# Patient Record
Sex: Female | Born: 1950 | Race: White | Hispanic: No | Smoking: Former smoker
Health system: Southern US, Community
[De-identification: ages and names within clinical notes are randomized; demographics above are authoritative.]

## PROBLEM LIST (undated history)

## (undated) DIAGNOSIS — G2581 Restless legs syndrome: Secondary | ICD-10-CM

## (undated) DIAGNOSIS — Z8619 Personal history of other infectious and parasitic diseases: Secondary | ICD-10-CM

## (undated) DIAGNOSIS — F419 Anxiety disorder, unspecified: Secondary | ICD-10-CM

## (undated) DIAGNOSIS — F329 Major depressive disorder, single episode, unspecified: Secondary | ICD-10-CM

## (undated) DIAGNOSIS — I509 Heart failure, unspecified: Secondary | ICD-10-CM

## (undated) DIAGNOSIS — I10 Essential (primary) hypertension: Secondary | ICD-10-CM

## (undated) DIAGNOSIS — J449 Chronic obstructive pulmonary disease, unspecified: Secondary | ICD-10-CM

## (undated) DIAGNOSIS — G4700 Insomnia, unspecified: Secondary | ICD-10-CM

## (undated) DIAGNOSIS — I499 Cardiac arrhythmia, unspecified: Secondary | ICD-10-CM

## (undated) DIAGNOSIS — R0600 Dyspnea, unspecified: Secondary | ICD-10-CM

## (undated) DIAGNOSIS — F32A Depression, unspecified: Secondary | ICD-10-CM

## (undated) DIAGNOSIS — Z95810 Presence of automatic (implantable) cardiac defibrillator: Secondary | ICD-10-CM

## (undated) DIAGNOSIS — F411 Generalized anxiety disorder: Secondary | ICD-10-CM

## (undated) DIAGNOSIS — Z87442 Personal history of urinary calculi: Secondary | ICD-10-CM

## (undated) DIAGNOSIS — R51 Headache: Secondary | ICD-10-CM

## (undated) DIAGNOSIS — I2510 Atherosclerotic heart disease of native coronary artery without angina pectoris: Secondary | ICD-10-CM

## (undated) DIAGNOSIS — R519 Headache, unspecified: Secondary | ICD-10-CM

## (undated) DIAGNOSIS — Z9981 Dependence on supplemental oxygen: Secondary | ICD-10-CM

## (undated) DIAGNOSIS — I429 Cardiomyopathy, unspecified: Secondary | ICD-10-CM

## (undated) HISTORY — PX: CARDIAC CATHETERIZATION: SHX172

## (undated) HISTORY — DX: Cardiac arrhythmia, unspecified: I49.9

## (undated) HISTORY — PX: HERNIA REPAIR: SHX51

## (undated) HISTORY — PX: CARDIAC DEFIBRILLATOR PLACEMENT: SHX171

---

## 1999-03-14 ENCOUNTER — Emergency Department (HOSPITAL_COMMUNITY): Admission: EM | Admit: 1999-03-14 | Discharge: 1999-03-14 | Payer: Self-pay | Admitting: Emergency Medicine

## 1999-03-15 ENCOUNTER — Encounter: Payer: Self-pay | Admitting: Emergency Medicine

## 2005-06-25 ENCOUNTER — Emergency Department: Payer: Self-pay | Admitting: Unknown Physician Specialty

## 2005-06-25 ENCOUNTER — Other Ambulatory Visit: Payer: Self-pay

## 2005-08-20 ENCOUNTER — Emergency Department: Payer: Self-pay

## 2010-03-04 DIAGNOSIS — F411 Generalized anxiety disorder: Secondary | ICD-10-CM | POA: Insufficient documentation

## 2010-04-17 ENCOUNTER — Ambulatory Visit: Payer: Self-pay | Admitting: Family Medicine

## 2010-05-20 DIAGNOSIS — R609 Edema, unspecified: Secondary | ICD-10-CM | POA: Insufficient documentation

## 2010-08-22 ENCOUNTER — Ambulatory Visit: Payer: Self-pay | Admitting: Family Medicine

## 2010-09-06 ENCOUNTER — Inpatient Hospital Stay: Payer: Self-pay | Admitting: Internal Medicine

## 2010-10-14 DIAGNOSIS — G4700 Insomnia, unspecified: Secondary | ICD-10-CM | POA: Insufficient documentation

## 2010-11-01 ENCOUNTER — Ambulatory Visit: Payer: Self-pay | Admitting: Specialist

## 2010-11-21 ENCOUNTER — Ambulatory Visit: Payer: Self-pay | Admitting: Internal Medicine

## 2010-12-08 ENCOUNTER — Inpatient Hospital Stay: Payer: Self-pay | Admitting: Internal Medicine

## 2011-04-25 ENCOUNTER — Ambulatory Visit: Payer: Self-pay | Admitting: Family Medicine

## 2012-08-22 ENCOUNTER — Emergency Department: Payer: Self-pay | Admitting: Emergency Medicine

## 2012-09-21 ENCOUNTER — Inpatient Hospital Stay: Payer: Self-pay | Admitting: Internal Medicine

## 2012-09-21 DIAGNOSIS — I509 Heart failure, unspecified: Secondary | ICD-10-CM

## 2012-09-21 LAB — URINALYSIS, COMPLETE
Bacteria: NONE SEEN
Bilirubin,UR: NEGATIVE
Blood: NEGATIVE
Glucose,UR: NEGATIVE mg/dL (ref 0–75)
Ketone: NEGATIVE
Leukocyte Esterase: NEGATIVE
Nitrite: NEGATIVE
Ph: 5 (ref 4.5–8.0)
Protein: NEGATIVE
RBC,UR: <1 /HPF (ref 0–5)
Specific Gravity: 1.021 (ref 1.003–1.030)
Squamous Epithelial: 3
WBC UR: 1 /HPF (ref 0–5)

## 2012-09-21 LAB — CBC
HCT: 40.4 % (ref 35.0–47.0)
HGB: 13.5 g/dL (ref 12.0–16.0)
MCH: 29.5 pg (ref 26.0–34.0)
MCHC: 33.3 g/dL (ref 32.0–36.0)
MCV: 89 fL (ref 80–100)
Platelet: 202 10*3/uL (ref 150–440)
RBC: 4.56 10*6/uL (ref 3.80–5.20)
RDW: 16.6 % — ABNORMAL HIGH (ref 11.5–14.5)
WBC: 7.3 10*3/uL (ref 3.6–11.0)

## 2012-09-21 LAB — COMPREHENSIVE METABOLIC PANEL
Albumin: 3.9 g/dL (ref 3.4–5.0)
Alkaline Phosphatase: 84 U/L (ref 50–136)
Anion Gap: 5 — ABNORMAL LOW (ref 7–16)
BUN: 13 mg/dL (ref 7–18)
Bilirubin,Total: 0.9 mg/dL (ref 0.2–1.0)
Calcium, Total: 9.1 mg/dL (ref 8.5–10.1)
Chloride: 107 mmol/L (ref 98–107)
Co2: 27 mmol/L (ref 21–32)
Creatinine: 0.71 mg/dL (ref 0.60–1.30)
EGFR (African American): >60
EGFR (Non-African Amer.): >60
Glucose: 93 mg/dL (ref 65–99)
Osmolality: 277 (ref 275–301)
Potassium: 4.1 mmol/L (ref 3.5–5.1)
SGOT(AST): 28 U/L (ref 15–37)
SGPT (ALT): 20 U/L (ref 12–78)
Sodium: 139 mmol/L (ref 136–145)
Total Protein: 7.9 g/dL (ref 6.4–8.2)

## 2012-09-21 LAB — PRO B NATRIURETIC PEPTIDE: B-Type Natriuretic Peptide: 4505 pg/mL — ABNORMAL HIGH (ref 0–125)

## 2012-09-21 LAB — LIPASE, BLOOD: Lipase: 112 U/L (ref 73–393)

## 2012-09-21 LAB — TROPONIN I: Troponin-I: <0.02 ng/mL

## 2012-09-21 LAB — RAPID INFLUENZA A&B ANTIGENS

## 2012-09-22 LAB — CBC WITH DIFFERENTIAL/PLATELET
Basophil %: 0.5 %
Eosinophil #: 0.3 10*3/uL (ref 0.0–0.7)
Eosinophil %: 3.6 %
HGB: 11.8 g/dL — ABNORMAL LOW (ref 12.0–16.0)
Lymphocyte #: 2 10*3/uL (ref 1.0–3.6)
MCH: 29.5 pg (ref 26.0–34.0)
MCHC: 33 g/dL (ref 32.0–36.0)
MCV: 90 fL (ref 80–100)
Monocyte #: 0.6 x10 3/mm (ref 0.2–0.9)
Monocyte %: 7.9 %
Neutrophil #: 4.4 10*3/uL (ref 1.4–6.5)
Platelet: 168 10*3/uL (ref 150–440)
RBC: 4 10*6/uL (ref 3.80–5.20)
RDW: 16.7 % — ABNORMAL HIGH (ref 11.5–14.5)
WBC: 7.3 10*3/uL (ref 3.6–11.0)

## 2012-09-26 LAB — CULTURE, BLOOD (SINGLE)

## 2013-02-22 ENCOUNTER — Inpatient Hospital Stay: Payer: Self-pay | Admitting: Internal Medicine

## 2013-02-22 LAB — COMPREHENSIVE METABOLIC PANEL
ALT: 22 U/L (ref 12–78)
AST: 22 U/L (ref 15–37)
Albumin: 3.4 g/dL (ref 3.4–5.0)
Alkaline Phosphatase: 75 U/L
Anion Gap: 4 — ABNORMAL LOW (ref 7–16)
BUN: 10 mg/dL (ref 7–18)
Bilirubin,Total: 1 mg/dL (ref 0.2–1.0)
CALCIUM: 9 mg/dL (ref 8.5–10.1)
Chloride: 101 mmol/L (ref 98–107)
Co2: 32 mmol/L (ref 21–32)
Creatinine: 0.71 mg/dL (ref 0.60–1.30)
EGFR (Non-African Amer.): >60
Glucose: 101 mg/dL — ABNORMAL HIGH (ref 65–99)
Osmolality: 273 (ref 275–301)
Potassium: 4.3 mmol/L (ref 3.5–5.1)
Sodium: 137 mmol/L (ref 136–145)
TOTAL PROTEIN: 7.7 g/dL (ref 6.4–8.2)

## 2013-02-22 LAB — CBC WITH DIFFERENTIAL/PLATELET
BASOS ABS: 0.1 10*3/uL (ref 0.0–0.1)
Basophil %: 0.8 %
EOS ABS: 0.2 10*3/uL (ref 0.0–0.7)
Eosinophil %: 1.9 %
HCT: 40.2 % (ref 35.0–47.0)
HGB: 13.1 g/dL (ref 12.0–16.0)
LYMPHS ABS: 1.8 10*3/uL (ref 1.0–3.6)
Lymphocyte %: 14 %
MCH: 29.2 pg (ref 26.0–34.0)
MCHC: 32.7 g/dL (ref 32.0–36.0)
MCV: 89 fL (ref 80–100)
Monocyte #: 0.8 x10 3/mm (ref 0.2–0.9)
Monocyte %: 6.4 %
Neutrophil #: 10.1 10*3/uL — ABNORMAL HIGH (ref 1.4–6.5)
Neutrophil %: 76.9 %
Platelet: 204 10*3/uL (ref 150–440)
RBC: 4.5 10*6/uL (ref 3.80–5.20)
RDW: 17.4 % — ABNORMAL HIGH (ref 11.5–14.5)
WBC: 13.1 10*3/uL — ABNORMAL HIGH (ref 3.6–11.0)

## 2013-02-22 LAB — TROPONIN I

## 2013-02-22 LAB — PRO B NATRIURETIC PEPTIDE: B-TYPE NATIURETIC PEPTID: 9944 pg/mL — AB (ref 0–125)

## 2013-02-23 DIAGNOSIS — I059 Rheumatic mitral valve disease, unspecified: Secondary | ICD-10-CM

## 2013-02-23 LAB — CBC WITH DIFFERENTIAL/PLATELET
BASOS PCT: 0.4 %
Basophil #: 0 10*3/uL (ref 0.0–0.1)
EOS PCT: 0.1 %
Eosinophil #: 0 10*3/uL (ref 0.0–0.7)
HCT: 39 % (ref 35.0–47.0)
HGB: 12.3 g/dL (ref 12.0–16.0)
LYMPHS ABS: 1 10*3/uL (ref 1.0–3.6)
Lymphocyte %: 10.3 %
MCH: 28.1 pg (ref 26.0–34.0)
MCHC: 31.6 g/dL — ABNORMAL LOW (ref 32.0–36.0)
MCV: 89 fL (ref 80–100)
MONO ABS: 0.1 x10 3/mm — AB (ref 0.2–0.9)
MONOS PCT: 1.2 %
NEUTROS ABS: 8.5 10*3/uL — AB (ref 1.4–6.5)
Neutrophil %: 88 %
PLATELETS: 187 10*3/uL (ref 150–440)
RBC: 4.39 10*6/uL (ref 3.80–5.20)
RDW: 17.5 % — ABNORMAL HIGH (ref 11.5–14.5)
WBC: 9.7 10*3/uL (ref 3.6–11.0)

## 2013-02-23 LAB — BASIC METABOLIC PANEL
Anion Gap: 4 — ABNORMAL LOW (ref 7–16)
BUN: 11 mg/dL (ref 7–18)
CALCIUM: 8.7 mg/dL (ref 8.5–10.1)
CREATININE: 0.66 mg/dL (ref 0.60–1.30)
Chloride: 104 mmol/L (ref 98–107)
Co2: 29 mmol/L (ref 21–32)
EGFR (Non-African Amer.): >60
Glucose: 153 mg/dL — ABNORMAL HIGH (ref 65–99)
OSMOLALITY: 276 (ref 275–301)
Potassium: 4.1 mmol/L (ref 3.5–5.1)
Sodium: 137 mmol/L (ref 136–145)

## 2013-04-18 ENCOUNTER — Ambulatory Visit: Payer: Self-pay | Admitting: Family Medicine

## 2013-04-22 ENCOUNTER — Emergency Department: Payer: Self-pay | Admitting: Emergency Medicine

## 2013-04-22 LAB — BASIC METABOLIC PANEL
Anion Gap: 4 — ABNORMAL LOW (ref 7–16)
BUN: 15 mg/dL (ref 7–18)
CALCIUM: 8.9 mg/dL (ref 8.5–10.1)
Chloride: 102 mmol/L (ref 98–107)
Co2: 31 mmol/L (ref 21–32)
Creatinine: 0.76 mg/dL (ref 0.60–1.30)
EGFR (African American): >60
EGFR (Non-African Amer.): >60
GLUCOSE: 143 mg/dL — AB (ref 65–99)
Osmolality: 277 (ref 275–301)
Potassium: 3.7 mmol/L (ref 3.5–5.1)
SODIUM: 137 mmol/L (ref 136–145)

## 2013-04-22 LAB — PRO B NATRIURETIC PEPTIDE: B-TYPE NATIURETIC PEPTID: 6573 pg/mL — AB (ref 0–125)

## 2013-04-22 LAB — CBC
HCT: 45.3 % (ref 35.0–47.0)
HGB: 14.4 g/dL (ref 12.0–16.0)
MCH: 28.8 pg (ref 26.0–34.0)
MCHC: 31.8 g/dL — AB (ref 32.0–36.0)
MCV: 91 fL (ref 80–100)
PLATELETS: 206 10*3/uL (ref 150–440)
RBC: 5 10*6/uL (ref 3.80–5.20)
RDW: 17.5 % — ABNORMAL HIGH (ref 11.5–14.5)
WBC: 7.6 10*3/uL (ref 3.6–11.0)

## 2013-04-22 LAB — TROPONIN I: Troponin-I: <0.02 ng/mL

## 2013-09-21 ENCOUNTER — Emergency Department: Payer: Self-pay | Admitting: Emergency Medicine

## 2013-09-21 LAB — PRO B NATRIURETIC PEPTIDE: B-Type Natriuretic Peptide: 2279 pg/mL — ABNORMAL HIGH (ref 0–125)

## 2013-09-21 LAB — BASIC METABOLIC PANEL
ANION GAP: 5 — AB (ref 7–16)
BUN: 15 mg/dL (ref 7–18)
CALCIUM: 9.2 mg/dL (ref 8.5–10.1)
CREATININE: 0.79 mg/dL (ref 0.60–1.30)
Chloride: 104 mmol/L (ref 98–107)
Co2: 32 mmol/L (ref 21–32)
EGFR (Non-African Amer.): >60
Glucose: 97 mg/dL (ref 65–99)
Osmolality: 282 (ref 275–301)
POTASSIUM: 4.1 mmol/L (ref 3.5–5.1)
SODIUM: 141 mmol/L (ref 136–145)

## 2013-09-21 LAB — CBC
HCT: 42.5 % (ref 35.0–47.0)
HGB: 13.4 g/dL (ref 12.0–16.0)
MCH: 29 pg (ref 26.0–34.0)
MCHC: 31.6 g/dL — AB (ref 32.0–36.0)
MCV: 92 fL (ref 80–100)
Platelet: 182 10*3/uL (ref 150–440)
RBC: 4.63 10*6/uL (ref 3.80–5.20)
RDW: 15.2 % — ABNORMAL HIGH (ref 11.5–14.5)
WBC: 6.4 10*3/uL (ref 3.6–11.0)

## 2013-09-21 LAB — TROPONIN I: Troponin-I: <0.02 ng/mL

## 2013-10-08 DIAGNOSIS — L309 Dermatitis, unspecified: Secondary | ICD-10-CM | POA: Insufficient documentation

## 2013-11-06 DIAGNOSIS — R0602 Shortness of breath: Secondary | ICD-10-CM | POA: Insufficient documentation

## 2013-12-07 ENCOUNTER — Emergency Department: Payer: Self-pay | Admitting: Emergency Medicine

## 2013-12-07 LAB — CBC
HCT: 43.4 % (ref 35.0–47.0)
HGB: 14.1 g/dL (ref 12.0–16.0)
MCH: 29.6 pg (ref 26.0–34.0)
MCHC: 32.5 g/dL (ref 32.0–36.0)
MCV: 91 fL (ref 80–100)
Platelet: 171 10*3/uL (ref 150–440)
RBC: 4.77 10*6/uL (ref 3.80–5.20)
RDW: 14.6 % — AB (ref 11.5–14.5)
WBC: 4.8 10*3/uL (ref 3.6–11.0)

## 2013-12-07 LAB — BASIC METABOLIC PANEL
ANION GAP: 9 (ref 7–16)
BUN: 8 mg/dL (ref 7–18)
CHLORIDE: 99 mmol/L (ref 98–107)
CO2: 31 mmol/L (ref 21–32)
Calcium, Total: 8.7 mg/dL (ref 8.5–10.1)
Creatinine: 0.77 mg/dL (ref 0.60–1.30)
EGFR (African American): >60
Glucose: 97 mg/dL (ref 65–99)
OSMOLALITY: 276 (ref 275–301)
POTASSIUM: 3.8 mmol/L (ref 3.5–5.1)
Sodium: 139 mmol/L (ref 136–145)

## 2013-12-07 LAB — PRO B NATRIURETIC PEPTIDE: B-Type Natriuretic Peptide: 4636 pg/mL — ABNORMAL HIGH (ref 0–125)

## 2013-12-07 LAB — TROPONIN I: Troponin-I: <0.02 ng/mL

## 2014-02-03 DIAGNOSIS — B370 Candidal stomatitis: Secondary | ICD-10-CM | POA: Insufficient documentation

## 2014-02-03 DIAGNOSIS — I5023 Acute on chronic systolic (congestive) heart failure: Secondary | ICD-10-CM | POA: Insufficient documentation

## 2014-02-28 ENCOUNTER — Emergency Department: Payer: Self-pay | Admitting: Emergency Medicine

## 2014-03-19 ENCOUNTER — Ambulatory Visit: Payer: Self-pay | Admitting: Internal Medicine

## 2014-03-31 DIAGNOSIS — K8010 Calculus of gallbladder with chronic cholecystitis without obstruction: Secondary | ICD-10-CM | POA: Insufficient documentation

## 2014-04-03 HISTORY — PX: CARDIAC CATHETERIZATION: SHX172

## 2014-04-14 ENCOUNTER — Ambulatory Visit
Admit: 2014-04-14 | Disposition: A | Discharge status: Home / Self Care | Payer: Self-pay | Attending: Cardiology | Admitting: Cardiology

## 2014-04-23 DIAGNOSIS — Z9889 Other specified postprocedural states: Secondary | ICD-10-CM | POA: Insufficient documentation

## 2014-04-24 NOTE — H&P (Signed)
PATIENT NAME:  Maria Neal, Maria Neal MR#:  [id number] DATE OF BIRTH:  1950-06-27  DATE OF ADMISSION:  09/21/2012  PRIMARY CARE PHYSICIAN:  Dr. Clemmie Krill.  PRESENTING COMPLAINT:   Nausea and left lower chest pain.   HISTORY OF PRESENTING ILLNESS:  The patient is a 64 year old female with past medical history of severe cardiomyopathy, ejection fraction 25%, MRSA pneumonia in the past, COPD, current smoker, hypertension, who was in her usual state of health until two days ago, and then she started feeling nauseous. She vomited once and is having left lower chest pain, which is mostly on the side and also going down into her abdomen on the left side, so she decided to come to the Emergency Room. This pain is almost 5 to 6/10, and which is sharp, getting worse with deep breaths or cough. CT of the abdomen was done in the ER, which showed bilateral lower lobe ground glass opacity on the lung but abdomen was without any significant finding. On further questioning, the patient denies any fever or sputum production or any sick contacts. She is having intermittent home oxygen use, which she needs to use very less frequent in the daytime whenever she is short of breath. Her oxygen saturation as per her home saturation checkup is remaining around 90s, sometimes it goes in the high 80s, and she started using oxygen. For the last two days it was dropping up to 85, and so she decided to come to the Emergency Room. In ER on room air, her oxygen saturation was 86 and so she was given 2 L supplemental oxygen, and saturation was maintained about 94 to 95. Hospitalist service is being contacted for admitting her for pneumonia.   REVIEW OF SYSTEMS:  CONSTITUTIONAL:  Negative for fever, fatigue, weakness, pain or weight loss.  EYES:  No blurring, double vision, pain or redness.  EARS, NOSE, THROAT:  No tinnitus, ear pain or hearing loss.  RESPIRATORY:  No cough or wheezing. No shortness of breath.  CARDIOVASCULAR:  Mild chest pain, which  is in the left lower side, getting worse with deep breaths, no edema on the legs, or arrhythmia or palpitations.  GASTROINTESTINAL:  Has feeling of nausea and vomited once, no diarrhea or abdominal pain.  GENITOURINARY:  No dysuria, hematuria or increased frequency of urination.  ENDOCRINE:  No increased sweating or heat or cold intolerance.  SKIN:  No acne, rashes or lesions on the skin.  MUSCULOSKELETAL:  No pain or swelling in the joints.  NEUROLOGICAL:  No numbness, weakness, tremors or vertigo.  PSYCHIATRIC:  No anxiety, insomnia or bipolar disorder.   PAST MEDICAL HISTORY:  1.  In 09/2010, had respiratory failure and MRSA pneumonia.  2.  Hypertension.  3.  Cardiomyopathy, ejection fraction less than 25% in 09/2010.  4.  COPD and chronic respiratory failure, using home oxygen on as-needed basis.   PAST SURGICAL HISTORY:  Bronchoscopies 2 times for pneumonia.  ALLERGIES:  AMOXICILLIN AND LEVAQUIN.   HOME MEDICATIONS:  1.  Aspirin 81 mg. 2. Coreg 3.125 mg b.i.d.  3.  Stopped taking Lasix 2 to 3 months ago because she ran out of that and she did not have any refills. 4.  Does not use any inhalers as she has not had any pulmonologist to followup and she does not have enough finances to cover for her medication.   FAMILY HISTORY:  High blood pressure.   SOCIAL HISTORY:  Lives in [place]. She is a current smoker, was previously smoking 3 to  4 cigarettes a day, currently trying to cut down, and now she is smoking 4 to 5 cigarettes in a week. Denies any alcohol abuse or drug abuse.  PHYSICAL EXAMINATION: VITAL SIGNS:  Temperature 98, pulse 93, respirations 22, and blood pressure 139/79, pulse ox 86 on room air, which is 93 on oxygen supplementation.  HEENT:  Head and neck atraumatic. Conjunctivae pink. Oral mucosa moist.  NECK:  Supple. No JVD.  RESPIRATORY:  Bilateral crepitation present, which is more in lower half of the chest. No wheezing.  CARDIOVASCULAR:  S1, S2 present.  Murmur present, regular.  ABDOMEN:  Soft, nontender. Bowel sounds present. No organomegaly.  SKIN:  No rashes.  LEGS:  No edema.  NEUROLOGICAL:  Power 5/5. Follows commands. No gross abnormality.  JOINTS:  No swelling or tenderness.  PSYCHIATRIC:  Does not appear in any psychiatric illness at this time.  IMPORTANT LABORATORY RESULTS:  BNP 4500, glucose 93, BUN 13, creatine 0.71, sodium 139, potassium 4.1, chloride 107, CO2 27 and calcium 9.1. Troponin less than 0.02. White cell count 7.3, hemoglobin 13.5, platelet count 202 and MCV 89. Urinalysis is grossly negative.   CT of the abdomen and pelvis without contrast:  No urolithiasis or obstructive urolopathy. Heterogeneous ground glass opacity bilateral lung bases, which may be secondary to infectious or inflammatory etiology including hypersensitivity pneumonitis versus edema.   Chest x-ray, PA and lateral, bilateral diffuse interstitial thickening likely representing interstitial edema versus interstitial pneumonitis secondary to an infectious or inflammatory etiology.   ASSESSMENT AND PLAN:  A 64 year old female with the past medical history of chronic obstructive pulmonary disease and oxygen use, current smoker and severe cardiomyopathy, ejection fraction less than 25%, presented with 2-week history of nausea and vomiting, once with mild hypoxia and requiring continuous oxygen supplementation instead of intermittent, which is her baseline.  1.  Pneumonia. As evident by chest x-ray and CT of the abdomen, with having symptoms of left lower chest pain, which is worse on deep breathes and increased oxygen requirement. We will give her Rocephin and azithromycin IV and continue oxygen supplementation. Blood cultures has been sent by Emergency Room.  2.  Acute on chronic congestive heart failure, ejection fraction less than 25%, bilateral crepitation present. No leg edema. Chest x-ray is suggestive of either diffuse interstitial edema versus infiltrate.  We will give her IV Lasix b.i.d. and repeat chest x-ray tomorrow to see comparison. 3.  Hypertension. We will continue Coreg 3.125 mg b.i.d. and aspirin.  4.  Acute on chronic respiratory failure. Oxygen saturation 86 on room air. She is on oxygen supplementation, 2 L nasal cannula oxygen. We will continue that.   CODE STATUS:  FULL CODE.    TOTAL TIME SPENT:  50 minutes. Smoking cessation counseling is done for 5 minutes and she was offered nicotine patch but she refused to use it and she says she will be fine without that. Cessation counseling done for 5 minutes for that.   ____________________________ Ceasar Lund. Anselm Jungling, MD vgv:jm D: 09/21/2012 17:53:00 ET T: 09/21/2012 18:05:45 ET JOB#: [id number]  cc: Ceasar Lund. Anselm Jungling, MD, <Dictator> Vaughan Basta MD ELECTRONICALLY SIGNED 09/27/2012 18:34

## 2014-04-24 NOTE — Discharge Summary (Signed)
PATIENT NAME:  Maria Neal, Maria Neal MR#:  [id number] DATE OF BIRTH:  1950/09/04  DATE OF ADMISSION:  09/21/2012 DATE OF DISCHARGE:  09/22/2012  DISCHARGE DIAGNOSES:  1. Acute on chronic respiratory failure.  2. Congestive heart failure, acute systolic failure.  3. Pneumonia.  4. Hypertension.   CONDITION ON DISCHARGE: Stable.   CODE STATUS: Full code.   MEDICATIONS ON DISCHARGE:  1. Ramipril 2.5 mg oral capsule once a day.  2. Aspirin 81 mg once a day.  3. Coreg 3.125 mg 2 times a day.  4. Furosemide 20 mg take 1/2-tablet once a day.  5. Ceftin 500 mg oral tablet 2 times a day for 3 days.   HOME HEALTH ON DISCHARGE: No.   HOME OXYGEN: Yes, 2 liters nasal cannula oxygen advised as she was taking before.   DIET ON DISCHARGE: Low sodium. Diet consistency: Regular.   ACTIVITY LIMITATION: As tolerated.   TIMEFRAME TO FOLLOWUP: Within 2 to 4 weeks with primary care physician as a regular appointment with Dr. Clemmie Krill in [place], [place].   HISTORY OF PRESENTING ILLNESS: A 64 year old female with past medical history of severe cardiomyopathy, ejection fraction 25%, and MRSA pneumonia in the past, with COPD and current smoker, who presented to the Emergency Room with pain on the abdomen on the left side, which is also on the lower chest, and decided to come to Emergency Room. Pain was 5 to 6 out of 10, sharp and getting worse with deep breath. CT of the abdomen which was done in the ER showed bilateral lower lobe ground-glass opacity on the lung, but abdomen was without any significant finding. Her oxygen saturation in the ER was also going up to 80s on room air, and she was requiring 2 to 3 liters nasal cannula supplemental oxygen, which she uses at home only on as-needed basis. On examination, she also had bilateral crepitation present in lower half of the chest, both sides, so she was admitted with diagnosis of acute on chronic respiratory failure secondary to pneumonia versus acute congestive  heart failure.   HOSPITAL COURSE AND STAY:  1. She was given a dose of IV Lasix and was also given IV Rocephin, and the next day she was feeling much better, and the pain was gone, was able to breathe comfortably without having any pain, so we decided to send her home with advice to finish her course of 5 days of oral antibiotic at home. She tolerated IV ceftriaxone very well in hospital, and so we discharged her on cephalosporin orally.  Other medical issues in the hospital course:  2. Acute systolic heart failure, ejection fraction was less than 20%. Lasix was given IV, and she had significant diuresis in response to that, and so we gave her a prescription of oral Lasix to be taken at home.  3. Hypertension. Continued Coreg and Lasix and remained under control.   IMPORTANT LABORATORY RESULTS IN THE HOSPITAL: BNP on presentation 4500. Creatinine 0.71, sodium 139, potassium 4.1. Troponin was less than 0.02. White cell count was 7.3. Blood cultures were negative, influenza was negative, and urinalysis was negative. CT of the abdomen and pelvis which was done in ER showed no urolithiasis or obstructive uropathy. There is a heterogeneous ground-glass opacity in bilateral lung bases which may be secondary to infectious or inflammatory etiology, including hypersensitivity pneumonitis, versus edema. Chest x-ray, PA and lateral, in the ER showed bilateral diffuse interstitial thickening, likely representing interstitial edema versus interstitial pneumonitis. Repeat chest x-ray on the  next day showed bilateral diffuse interstitial thickening, representing interstitial edema versus pneumonitis. No significant interval change from prior exam.   TOTAL TIME SPENT ON THIS DISCHARGE: 40 minutes.   ____________________________ Ceasar Lund Anselm Jungling, MD vgv:OSi D: 09/25/2012 11:32:50 ET T: 09/25/2012 11:54:32 ET JOB#: [id number]  cc: Ceasar Lund. Anselm Jungling, MD, <Dictator> Vaughan Basta  MD ELECTRONICALLY SIGNED 09/27/2012 18:34

## 2014-04-25 NOTE — Discharge Summary (Signed)
PATIENT NAME:  Maria Neal, Maria Neal MR#:  [id number] DATE OF BIRTH:  1950-03-17  DATE OF ADMISSION:  02/22/2013 DATE OF DISCHARGE:  02/24/2013  PRIMARY DOCTOR: Valetta Close, MD  DISCHARGE DIAGNOSES:  1. Bilateral pneumonia. 2. Chronic systolic heart failure.  3. Hypertension.  4. Chronic obstructive pulmonary disease, on home oxygen.   DISCHARGE MEDICATIONS:  1. Aspirin 81 mg p.o. daily. 2. Coreg 3.125 mg p.o. b.i.d.  3. Lasix 20 mg p.o. b.i.d.  4. Lisinopril 10 mg p.o. daily.  5. Symbicort 160/4.5 one puff b.i.d.  6. Spiriva 18 mcg inhalation daily.  7. Singulair 10 mg p.o. daily.  8. Azithromycin 500 mg daily. The patient given azithromycin until February 28th, that is for 5 days.   CONSULTATIONS: None.   HOSPITAL COURSE: The patient is a 64 year old female patient with history of chronic systolic heart failure, with EF of 25%, who came in because of shortness of breath and also pneumonia. The patient treated with steroids and antibiotics by Dr. Clemmie Krill, and the patient quit smoking 3 weeks ago, comes in because of shortness of breath, tachypnea, and the patient was in respiratory distress with O2 saturations 90% on room air. Chest x-ray showed bilateral pneumonia on admission. The patient's blood pressure was 132/70, and O2 saturations were 90% on room air at rest and temperature 96.4. The patient has diffuse rhonchi and wheezing in both lungs and increased respiratory effort on admission. The patient's chest x-ray revealed bilateral pneumonia, more in the left upper lobe, and EKG showed normal sinus rhythm with no ST-T changes. The patient's white count was 13.1 on admission. So, she was admitted for bilateral pneumonia with failed outpatient therapy with acute respiratory distress. Started on IV Solu-Medrol along with nebulizers, oxygen, and we continued her Spiriva as well as Symbicort and Singulair. The patient received Rocephin and Zithromax during the hospital stay. THE PATIENT HAS  DOCUMENTED ALLERGY TO AMOXICILLIN, BUT SHE TOLERATED ROCEPHIN WELL. The patient's symptoms improved nicely. She did not have any further wheezing. The patient does have a steroid course that she has from a prescription by Dr. Clemmie Krill, so I did not write any prescriptions of steroids. She has like 6 tablets of 20 mg of prednisone. I told her to take two for 2 days and then one for 1 day, the she can see Dr. Clemmie Krill as needed. The patient's initial white count was up at 13.1, and the patient's repeat white count on 22nd was 9.7. The patient's electrolytes were normal. The patient's discharge vitals showed O2 saturations of 91% at rest The patient does have oxygen at home 2 liters. She follows up with Dr. Raul Del. She told me that she does not have portable oxygen, but she does not want it as she could not afford it. She did not want me to set that up.   Chronic systolic heart failure. The patient has history of chronic systolic heart failure with poor EF of around 25%. BNP on admission 9944. The patient has Lasix and also ACE inhibitors, and the patient's echo is repeated here, which showed EF of 20% to 25% and severely decreased LV function and increased left ventricular cavity size. The patient does have Lasix, lisinopril and Coreg that she takes. Advised her to continue that. The patient used to see Dr. Clayborn Bigness before, and advised her to be compliant with medications with low sodium diet and also checking daily weights.   TIME SPENT ON DISCHARGE PREPARATION: More than 30 minutes.   CODE STATUS: Full code.  ____________________________ Epifanio Lesches, MD sk:lb D: 02/26/2013 11:14:21 ET T: 02/26/2013 11:27:42 ET JOB#: [id number]  cc: Epifanio Lesches, MD, <Dictator> Valetta Close, MD Epifanio Lesches MD ELECTRONICALLY SIGNED 02/27/2013 14:11

## 2014-04-25 NOTE — H&P (Signed)
PATIENT NAME:  Maria Neal, Maria Neal MR#:  [id number] DATE OF BIRTH:  [birth year]  DATE OF ADMISSION:  02/22/2013  REFERRING PHYSICIAN:  Dr. Benjaman Lobe.   FAMILY PHYSICIAN:  Dr. Clemmie Krill.   REASON FOR ADMISSION:  Acute respiratory distress with pneumonia.   HISTORY OF PRESENT ILLNESS:  The patient is a 64 year old female with a history of COPD, benign hypertension, previous pneumonia, history of MRSA and a history of CHF.  Has been treated by Dr. Clemmie Krill with by mouth steroids and by mouth antibiotics for pneumonia recently.  She quit smoking three weeks ago.  Presents to the Emergency Room with worsening shortness of breath and tachypneic despite by mouth steroids and by mouth antibiotics.  In the Emergency Room, the patient was noted to be in moderate respiratory distress, she was tachypneic with a sat of 90% on room air.  Chest x-ray revealed bilateral pneumonia and she is now admitted for further evaluation.   PAST MEDICAL HISTORY: 1.  COPD/tobacco abuse.  2.  Benign hypertension.  3.  History of pneumonia.  4.  History of MRSA.  5.  Questionable history of CHF.   MEDICATIONS: 1.  Zestril 10 mg by mouth daily.  2.  Lasix 20 mg by mouth twice daily.  3.  Aspirin 81 mg by mouth daily.  4.  Coreg 3.125 mg by mouth twice daily.   ALLERGIES:  AMOXICILLIN AND LEVAQUIN.   SOCIAL HISTORY:  The patient quit smoking three weeks ago.  Denies alcohol abuse.   FAMILY HISTORY:  Positive for diabetes, stroke, coronary artery disease, COPD and even lung cancer.  Negative for breast or colon cancer.   REVIEW OF SYSTEMS:  CONSTITUTIONAL:  The patient has had fever, but no change in weight.  EYES:  No blurred or double vision.  No glaucoma.  EARS, NOSE, THROAT:  No tinnitus or hearing loss.  No nasal discharge or bleeding.  No difficulty swallowing.  RESPIRATORY:  Denies hemoptysis.  No painful respiration.  CARDIOVASCULAR:  No chest pain or orthopnea.  No palpitations or syncope.  GASTROINTESTINAL:  Some nausea,  but no vomiting or diarrhea.  No abdominal pain or change in bowel habits.  GENITOURINARY:  No dysuria or hematuria.  No incontinence.  ENDOCRINE:  No polyuria or polydipsia.  No heat or cold intolerance.  HEMATOLOGIC:  The patient denies anemia, easy bruising, or bleeding.  LYMPHATIC:  No swollen glands.  MUSCULOSKELETAL:  The patient denies pain in her neck, back, shoulders, knees, or hips.  No gout.  NEUROLOGIC:  No numbness or migraines.  Denies stroke or seizures.  PSYCHIATRIC:  The patient denies anxiety, insomnia, or depression.   PHYSICAL EXAMINATION: GENERAL:  The patient is acutely ill-appearing, in moderate respiratory distress.  VITAL SIGNS:  Currently remarkable for a blood pressure of 132/70 with a heart rate of 86, respiratory rate of 32, temperature of 96.4 and a sat of 90% on room air at rest.  HEENT:  Normocephalic, atraumatic.  Pupils equally round and reactive to light and accommodation.  Extraocular movements are intact.  Sclerae nonicteric.  Conjunctivae clear.  Oropharynx is dry, but clear.  NECK:  Supple without JVD.  No adenopathy or thyromegaly is noted.  LUNGS:  Decreased breath sounds at the bases with scattered rhonchi and wheezes.  No rales.  Respiratory effort is increased.  CARDIAC:  Regular rate and rhythm with an occasional premature beat.  No significant rubs, murmurs or gallops.  PMI is nondisplaced.  Chest wall is nontender.  ABDOMEN:  Soft,  nontender, with normoactive bowel sounds.  No organomegaly or masses were appreciated.  No hernias or bruits were noted.  EXTREMITIES:  Without clubbing, cyanosis or edema.  Pulses were 2+ bilaterally.  SKIN:  Warm and dry without rash or lesions.  NEUROLOGIC:  Cranial nerves II through XII grossly intact.  Deep tendon reflexes were symmetric.  Motor and sensory examination is nonfocal.  PSYCHIATRIC:  The patient who is alert and oriented to person, place, and time.  She was cooperative and used good judgment.    LABORATORY DATA:  EKG revealed sinus rhythm with LVH, but no acute ischemic changes.  Chest x-ray revealed bilateral pneumonia most confluent in the left upper lobe.  Cardiomegaly was present.  White count was 13.1 with a hemoglobin of 13.1.  Glucose was 101 with a BUN of 10, creatinine of 0.71, sodium 137, potassium of 4.3.  GFR of greater than 60.  Troponin was less than 0.02.   ASSESSMENT: 1.  Bilateral pneumonia, failed outpatient treatment.  2.  Acute respiratory distress.  3.  Chronic obstructive pulmonary disease exacerbation.  4.  Benign hypertension.  5.  History of methicillin resistant Staphylococcus aureus.  6.  History of congestive heart failure per patient.   PLAN:  The patient will be admitted to the floor with oxygen, IV steroids, IV antibiotics and DuoNeb SVNs.  We will begin Symbicort, Spiriva and Singulair.  We will send off sputum for Gram stain and culture.  We will supplement oxygen and wean as tolerated.  We will continue her Lasix with her Coreg and lisinopril as well as her aspirin.  We will obtain an echocardiogram because of her history of congestive heart failure and shortness of breath at this time.  We will follow her sugars while on steroids.  Follow-up chest x-ray and labs in the morning.  Further treatment and evaluation will depend upon the patient's progress.   Total time spent on this patient was 50 minutes.    ____________________________ Leonie Douglas Doy Hutching, MD jds:ea D: 02/22/2013 15:48:50 ET T: 02/22/2013 16:00:59 ET JOB#: [id number]  cc: Leonie Douglas. Doy Hutching, MD, <Dictator> Valetta Close, MD JEFFREY Lennice Sites MD ELECTRONICALLY SIGNED 02/22/2013 17:47

## 2014-05-03 ENCOUNTER — Ambulatory Visit: Payer: Medicare Other

## 2014-05-03 ENCOUNTER — Ambulatory Visit
Admission: EM | Admit: 2014-05-03 | Discharge: 2014-05-03 | Disposition: A | Discharge status: Home / Self Care | Payer: Medicare Other | Attending: Internal Medicine | Admitting: Internal Medicine

## 2014-05-03 DIAGNOSIS — Z79899 Other long term (current) drug therapy: Secondary | ICD-10-CM | POA: Diagnosis not present

## 2014-05-03 DIAGNOSIS — I2510 Atherosclerotic heart disease of native coronary artery without angina pectoris: Secondary | ICD-10-CM | POA: Insufficient documentation

## 2014-05-03 DIAGNOSIS — I429 Cardiomyopathy, unspecified: Secondary | ICD-10-CM | POA: Diagnosis not present

## 2014-05-03 DIAGNOSIS — J101 Influenza due to other identified influenza virus with other respiratory manifestations: Secondary | ICD-10-CM

## 2014-05-03 DIAGNOSIS — G4700 Insomnia, unspecified: Secondary | ICD-10-CM | POA: Diagnosis not present

## 2014-05-03 DIAGNOSIS — R05 Cough: Secondary | ICD-10-CM | POA: Diagnosis present

## 2014-05-03 DIAGNOSIS — I10 Essential (primary) hypertension: Secondary | ICD-10-CM | POA: Insufficient documentation

## 2014-05-03 DIAGNOSIS — J111 Influenza due to unidentified influenza virus with other respiratory manifestations: Secondary | ICD-10-CM | POA: Diagnosis not present

## 2014-05-03 DIAGNOSIS — F419 Anxiety disorder, unspecified: Secondary | ICD-10-CM | POA: Diagnosis not present

## 2014-05-03 DIAGNOSIS — R52 Pain, unspecified: Secondary | ICD-10-CM | POA: Diagnosis present

## 2014-05-03 DIAGNOSIS — J441 Chronic obstructive pulmonary disease with (acute) exacerbation: Secondary | ICD-10-CM | POA: Diagnosis not present

## 2014-05-03 DIAGNOSIS — Z833 Family history of diabetes mellitus: Secondary | ICD-10-CM | POA: Diagnosis not present

## 2014-05-03 DIAGNOSIS — Z7982 Long term (current) use of aspirin: Secondary | ICD-10-CM | POA: Diagnosis not present

## 2014-05-03 DIAGNOSIS — I509 Heart failure, unspecified: Secondary | ICD-10-CM | POA: Insufficient documentation

## 2014-05-03 DIAGNOSIS — H6123 Impacted cerumen, bilateral: Secondary | ICD-10-CM | POA: Insufficient documentation

## 2014-05-03 DIAGNOSIS — J449 Chronic obstructive pulmonary disease, unspecified: Secondary | ICD-10-CM | POA: Diagnosis not present

## 2014-05-03 HISTORY — DX: Insomnia, unspecified: G47.00

## 2014-05-03 HISTORY — DX: Atherosclerotic heart disease of native coronary artery without angina pectoris: I25.10

## 2014-05-03 HISTORY — DX: Cardiomyopathy, unspecified: I42.9

## 2014-05-03 HISTORY — DX: Heart failure, unspecified: I50.9

## 2014-05-03 HISTORY — DX: Essential (primary) hypertension: I10

## 2014-05-03 HISTORY — DX: Anxiety disorder, unspecified: F41.9

## 2014-05-03 HISTORY — DX: Chronic obstructive pulmonary disease, unspecified: J44.9

## 2014-05-03 LAB — RAPID INFLUENZA A&B ANTIGENS
Influenza A (ARMC): NOT DETECTED
Influenza B (ARMC): DETECTED

## 2014-05-03 MED ORDER — BENZONATATE 200 MG PO CAPS: 200.0000 mg | ORAL_CAPSULE | Freq: Three times a day (TID) | ORAL | Status: DC | PRN

## 2014-05-03 MED ORDER — OSELTAMIVIR PHOSPHATE 75 MG PO CAPS: 75.0000 mg | ORAL_CAPSULE | Freq: Two times a day (BID) | ORAL | Status: DC

## 2014-05-03 MED ORDER — PREDNISONE 20 MG PO TABS: 40.0000 mg | ORAL_TABLET | Freq: Every day | ORAL | Status: AC

## 2014-05-03 NOTE — ED Notes (Signed)
Patient states that she called her primary doctor on Thursday last week and got a rx for cough medication( that she could not afford). She states that she has been coughing without production, chills. She states that her entire body aches. She states that this started Thursday, patient states that she has been worsening.

## 2014-05-03 NOTE — ED Provider Notes (Signed)
CSN: [id number]     Arrival date & time 05/03/14  63 History   First MD Initiated Contact with Patient 05/03/14 1520     Chief Complaint  Patient presents with  . Generalized Body Aches    Started thursday  . Cough   (Consider location/radiation/quality/duration/timing/severity/associated sxs/prior Treatment) Patient is a 64 y.o. female presenting with cough. The history is provided by the patient. No language interpreter was used.  Cough Progression:  Worsening Chronicity:  New Smoker: no   Context: not sick contacts   Associated symptoms: chills, ear fullness, ear pain, rhinorrhea, shortness of breath, sore throat and wheezing   Associated symptoms: no chest pain, no diaphoresis, no eye discharge and no fever   contacted PCM earlier this week and was prescribed cough medication but she was unable to fill due to cost.  Chills, runny nose, dry cough, sore throat, wheezing x 3 days, bad taste/food doesn't taste good x 3 days.  Heart catheterization one week ago and awaiting pacemaker placement per patient.  Has been using home O2 continuously 2L [place].  Does feel like her congestive heart failure is acting up this week.  Past Medical History  Diagnosis Date  . Hypertension   . CHF (congestive heart failure)   . Cardiomyopathy   . CAD (coronary artery disease)   . Anxiety   . COPD (chronic obstructive pulmonary disease)   . Insomnia    Past Surgical History  Procedure Laterality Date  . Cardiac catheterization  04/2014    Dr. Idelle Leech   Family History  Problem Relation Age of Onset  . Diabetes Mellitus II Mother   . Heart attack Father   . Arthritis Sister   . Heart disease Brother    History  Substance Use Topics  . Smoking status: Never Smoker   . Smokeless tobacco: Not on file  . Alcohol Use: No   OB History    No data available     Review of Systems  Constitutional: Positive for chills, activity change, appetite change and fatigue. Negative for fever, diaphoresis  and unexpected weight change.  HENT: Positive for congestion, ear pain, postnasal drip, rhinorrhea, sinus pressure, sneezing and sore throat. Negative for dental problem, drooling, ear discharge, facial swelling, hearing loss, mouth sores, nosebleeds and tinnitus.   Eyes: Negative.  Negative for discharge.  Respiratory: Positive for cough, shortness of breath and wheezing. Negative for apnea, choking, chest tightness and stridor.   Cardiovascular: Negative for chest pain.  Endocrine: Positive for cold intolerance.  Genitourinary: Negative.   Allergic/Immunologic: Negative for environmental allergies, food allergies and immunocompromised state.  Neurological: Negative.   Hematological: Negative.   Psychiatric/Behavioral: Negative.     Allergies  Amoxicillin; Levaquin; Nitrofuran derivatives; Penicillins; and Zithromax  Home Medications   Prior to Admission medications   Medication Sig Start Date End Date Taking? Authorizing Provider  acetaminophen (TYLENOL) 500 MG tablet Take 500 mg by mouth every 8 (eight) hours as needed.   Yes Historical Provider, MD  aspirin 81 MG tablet Take 81 mg by mouth daily.   Yes Historical Provider, MD  carvedilol (COREG) 6.25 MG tablet Take 6.25 mg by mouth 4 (four) times daily.   Yes Historical Provider, MD  furosemide (LASIX) 20 MG tablet Take 20 mg by mouth daily.   Yes Historical Provider, MD  lisinopril (PRINIVIL,ZESTRIL) 10 MG tablet Take 10 mg by mouth daily.   Yes Historical Provider, MD  benzonatate (TESSALON) 200 MG capsule Take 1 capsule (200 mg total) by mouth  3 (three) times daily as needed for cough. 05/03/14   Olen Cordial, NP  oseltamivir (TAMIFLU) 75 MG capsule Take 1 capsule (75 mg total) by mouth every 12 (twelve) hours. 05/03/14   Olen Cordial, NP   BP 112/58 mmHg  Pulse 77  Temp(Src) 98.6 F (37 C) (Tympanic)  Ht 5\' 4"  (1.626 m)  Wt 194 lb (87.998 kg)  BMI 33.28 kg/m2  SpO2 92% Physical Exam  Constitutional: She appears  well-developed and well-nourished. No distress.  HENT:  Head: Normocephalic and atraumatic.  Right Ear: Hearing and external ear normal. No drainage, swelling or tenderness. No mastoid tenderness. Tympanic membrane is not injected, not scarred, not perforated, not retracted and not bulging. A middle ear effusion is present. No hemotympanum.  Left Ear: Hearing and external ear normal. No drainage, swelling or tenderness. No mastoid tenderness. Tympanic membrane is not injected, not scarred, not perforated, not retracted and not bulging. A middle ear effusion is present. No hemotympanum.  Nose: Mucosal edema and rhinorrhea present. No nose lacerations, sinus tenderness, nasal deformity, septal deviation or nasal septal hematoma. No epistaxis.  No foreign bodies. Right sinus exhibits no maxillary sinus tenderness and no frontal sinus tenderness. Left sinus exhibits no maxillary sinus tenderness and no frontal sinus tenderness.  Mouth/Throat: Uvula is midline. Mucous membranes are not pale, dry and not cyanotic. She does not have dentures. No oral lesions. No trismus in the jaw. No dental abscesses, uvula swelling or dental caries. Posterior oropharyngeal edema and posterior oropharyngeal erythema present. No oropharyngeal exudate or tonsillar abscesses.  Bilateral external auditory canals with cerumen impaction brown soft; cobblestoning posterior pharynx, dyspnea with exertion  Cardiovascular: Normal rate, regular rhythm, normal heart sounds and intact distal pulses.   Pulmonary/Chest: Breath sounds normal. Accessory muscle usage present.  Skin: She is not diaphoretic.    ED Course  Procedures (including critical care time) Labs Review Labs Reviewed  INFLUENZA A&B ANTIGENS([facility])  Discussed with patient influenza B positive recommended starting tamiflu 75mg  po BID x 5 days due to comorbidities.  Patient did have influenza vaccine this year.  Patient reported hearing much improved and ears feel better  after cerumen irrigation/removal.  Re-evaluated patient ears auditory canal right with some erythema 6 oclock position no bleeding noted.  Air fluid level bilateral TMs with some opacity.  99% cerumen removed bilateral external canals.  Patient stated she does not have money to fill her prescriptions if more than $10 until later this month.  Patient requested tessalon pearles prescription for cough stating she had run out earlier this year.  Refilled for patient.  Tylenol 1000mg  po QID prn fever/pain.  Prednisone 40mg  po daily x 3 days.  Discussed chest xray results with patient once available no acute findings.  Patient given copy of report to show PCM when she follows up for re-evaluation later this week.  Patient to go to ER tonight if hemoptysis, worsening DOE, use home oxygen 2L.  Patient verbalized understanding of information/instructions, agreed with plan of care and had no further questions at this time.  Imaging Review Dg Chest 2 View  05/03/2014   CLINICAL DATA:  chills and shortness of breath with cough x3 days  EXAM: CHEST  2 VIEW  COMPARISON:  03/19/2014  FINDINGS: Stable moderate to severe cardiac enlargement and mild central vascular congestion. Discoid atelectasis in the lingula and mild lower lobe atelectasis. Mild atelectasis or scarring in the right middle lobe. No evidence of edema or infiltrate.  IMPRESSION: No acute findings  Electronically Signed   By: Skipper Cliche M.D.   On: 05/03/2014 16:24     MDM   1. Influenza B   2. Cerumen impaction, bilateral        Olen Cordial, NP 05/03/14 2030

## 2014-05-07 ENCOUNTER — Inpatient Hospital Stay
Admission: EM | Admit: 2014-05-07 | Discharge: 2014-05-09 | DRG: 194 | Disposition: A | Discharge status: Home / Self Care | Payer: Medicare Other | Attending: Internal Medicine | Admitting: Internal Medicine

## 2014-05-07 ENCOUNTER — Emergency Department: Payer: Medicare Other

## 2014-05-07 ENCOUNTER — Encounter: Payer: Self-pay | Admitting: Emergency Medicine

## 2014-05-07 DIAGNOSIS — Z23 Encounter for immunization: Secondary | ICD-10-CM | POA: Diagnosis not present

## 2014-05-07 DIAGNOSIS — Z7982 Long term (current) use of aspirin: Secondary | ICD-10-CM

## 2014-05-07 DIAGNOSIS — G4700 Insomnia, unspecified: Secondary | ICD-10-CM | POA: Diagnosis present

## 2014-05-07 DIAGNOSIS — I429 Cardiomyopathy, unspecified: Secondary | ICD-10-CM | POA: Diagnosis present

## 2014-05-07 DIAGNOSIS — R071 Chest pain on breathing: Secondary | ICD-10-CM

## 2014-05-07 DIAGNOSIS — Z881 Allergy status to other antibiotic agents status: Secondary | ICD-10-CM

## 2014-05-07 DIAGNOSIS — I10 Essential (primary) hypertension: Secondary | ICD-10-CM | POA: Diagnosis present

## 2014-05-07 DIAGNOSIS — Z9119 Patient's noncompliance with other medical treatment and regimen: Secondary | ICD-10-CM | POA: Diagnosis present

## 2014-05-07 DIAGNOSIS — J189 Pneumonia, unspecified organism: Secondary | ICD-10-CM | POA: Diagnosis present

## 2014-05-07 DIAGNOSIS — R9431 Abnormal electrocardiogram [ECG] [EKG]: Secondary | ICD-10-CM | POA: Diagnosis present

## 2014-05-07 DIAGNOSIS — I509 Heart failure, unspecified: Secondary | ICD-10-CM | POA: Diagnosis present

## 2014-05-07 DIAGNOSIS — J9610 Chronic respiratory failure, unspecified whether with hypoxia or hypercapnia: Secondary | ICD-10-CM | POA: Diagnosis present

## 2014-05-07 DIAGNOSIS — Z9981 Dependence on supplemental oxygen: Secondary | ICD-10-CM | POA: Diagnosis not present

## 2014-05-07 DIAGNOSIS — J111 Influenza due to unidentified influenza virus with other respiratory manifestations: Secondary | ICD-10-CM | POA: Diagnosis present

## 2014-05-07 DIAGNOSIS — Z8701 Personal history of pneumonia (recurrent): Secondary | ICD-10-CM

## 2014-05-07 DIAGNOSIS — Z87891 Personal history of nicotine dependence: Secondary | ICD-10-CM | POA: Diagnosis not present

## 2014-05-07 DIAGNOSIS — J440 Chronic obstructive pulmonary disease with acute lower respiratory infection: Secondary | ICD-10-CM | POA: Diagnosis present

## 2014-05-07 DIAGNOSIS — F419 Anxiety disorder, unspecified: Secondary | ICD-10-CM | POA: Diagnosis present

## 2014-05-07 DIAGNOSIS — I2510 Atherosclerotic heart disease of native coronary artery without angina pectoris: Secondary | ICD-10-CM | POA: Diagnosis present

## 2014-05-07 DIAGNOSIS — Z880 Allergy status to penicillin: Secondary | ICD-10-CM

## 2014-05-07 DIAGNOSIS — J1100 Influenza due to unidentified influenza virus with unspecified type of pneumonia: Principal | ICD-10-CM | POA: Diagnosis present

## 2014-05-07 DIAGNOSIS — J449 Chronic obstructive pulmonary disease, unspecified: Secondary | ICD-10-CM | POA: Diagnosis present

## 2014-05-07 LAB — CBC WITH DIFFERENTIAL/PLATELET
BASOS ABS: 0.1 10*3/uL (ref 0–0.1)
BASOS PCT: 1 %
Eosinophils Absolute: 0 10*3/uL (ref 0–0.7)
Eosinophils Relative: 0 %
HCT: 42.8 % (ref 35.0–47.0)
HEMOGLOBIN: 14 g/dL (ref 12.0–16.0)
Lymphocytes Relative: 10 %
Lymphs Abs: 1.1 10*3/uL (ref 1.0–3.6)
MCH: 28.5 pg (ref 26.0–34.0)
MCHC: 32.6 g/dL (ref 32.0–36.0)
MCV: 87.3 fL (ref 80.0–100.0)
Monocytes Absolute: 0.6 10*3/uL (ref 0.2–0.9)
Monocytes Relative: 5 %
NEUTROS ABS: 10 10*3/uL — AB (ref 1.4–6.5)
Neutrophils Relative %: 84 %
Platelets: 160 10*3/uL (ref 150–440)
RBC: 4.91 MIL/uL (ref 3.80–5.20)
RDW: 15.3 % — ABNORMAL HIGH (ref 11.5–14.5)
WBC: 11.8 10*3/uL — AB (ref 3.6–11.0)

## 2014-05-07 LAB — COMPREHENSIVE METABOLIC PANEL
ALT: 13 U/L — ABNORMAL LOW (ref 14–54)
AST: 26 U/L (ref 15–41)
Albumin: 4.1 g/dL (ref 3.5–5.0)
Alkaline Phosphatase: 55 U/L (ref 38–126)
Anion gap: 11 (ref 5–15)
BUN: 16 mg/dL (ref 6–20)
CALCIUM: 9.4 mg/dL (ref 8.9–10.3)
CO2: 29 mmol/L (ref 22–32)
CREATININE: 0.62 mg/dL (ref 0.44–1.00)
Chloride: 100 mmol/L — ABNORMAL LOW (ref 101–111)
GFR calc non Af Amer: >60 mL/min (ref >60)
Glucose, Bld: 134 mg/dL — ABNORMAL HIGH (ref 65–99)
POTASSIUM: 4.1 mmol/L (ref 3.5–5.1)
Sodium: 140 mmol/L (ref 135–145)
TOTAL PROTEIN: 7.7 g/dL (ref 6.5–8.1)
Total Bilirubin: 0.5 mg/dL (ref 0.3–1.2)

## 2014-05-07 LAB — TROPONIN I

## 2014-05-07 MED ORDER — ONDANSETRON HCL 4 MG PO TABS: 4.0000 mg | ORAL_TABLET | Freq: Four times a day (QID) | ORAL | Status: DC | PRN

## 2014-05-07 MED ORDER — ACETAMINOPHEN 325 MG PO TABS: 650.0000 mg | ORAL_TABLET | Freq: Four times a day (QID) | ORAL | Status: DC | PRN

## 2014-05-07 MED ORDER — MORPHINE SULFATE 2 MG/ML IJ SOLN: 2.0000 mg | INTRAMUSCULAR | Status: DC | PRN

## 2014-05-07 MED ORDER — ACETAMINOPHEN 650 MG RE SUPP: 650.0000 mg | Freq: Four times a day (QID) | RECTAL | Status: DC | PRN

## 2014-05-07 MED ORDER — HEPARIN SODIUM (PORCINE) 5000 UNIT/ML IJ SOLN
5000.0000 [IU] | Freq: Three times a day (TID) | INTRAMUSCULAR | Status: DC
  Administered 2014-05-08 – 2014-05-09 (×5): 5000 [IU] via SUBCUTANEOUS
  Filled 2014-05-07 (×5): qty 1

## 2014-05-07 MED ORDER — DEXTROSE 5 % IV SOLN
INTRAVENOUS | Status: AC
  Filled 2014-05-07: qty 10

## 2014-05-07 MED ORDER — DEXTROSE 5 % IV SOLN: 500.0000 mg | Freq: Once | INTRAVENOUS | Status: AC

## 2014-05-07 MED ORDER — SODIUM CHLORIDE 0.9 % IJ SOLN
3.0000 mL | Freq: Two times a day (BID) | INTRAMUSCULAR | Status: DC
  Administered 2014-05-08: 3 mL via INTRAVENOUS

## 2014-05-07 MED ORDER — METHYLPREDNISOLONE SODIUM SUCC 125 MG IJ SOLR
60.0000 mg | INTRAMUSCULAR | Status: DC
  Administered 2014-05-08: 60 mg via INTRAVENOUS
  Filled 2014-05-07: qty 2

## 2014-05-07 MED ORDER — ONDANSETRON HCL 4 MG/2ML IJ SOLN: 4.0000 mg | Freq: Four times a day (QID) | INTRAMUSCULAR | Status: DC | PRN

## 2014-05-07 MED ORDER — VANCOMYCIN HCL IN DEXTROSE 1-5 GM/200ML-% IV SOLN
1000.0000 mg | Freq: Once | INTRAVENOUS | Status: AC
  Administered 2014-05-07: 1000 mg via INTRAVENOUS

## 2014-05-07 MED ORDER — AZITHROMYCIN 500 MG IV SOLR
500.0000 mg | INTRAVENOUS | Status: DC
  Administered 2014-05-08: 500 mg via INTRAVENOUS
  Filled 2014-05-07 (×2): qty 500

## 2014-05-07 MED ORDER — VANCOMYCIN HCL IN DEXTROSE 1-5 GM/200ML-% IV SOLN
INTRAVENOUS | Status: AC
  Administered 2014-05-07: 1000 mg via INTRAVENOUS
  Filled 2014-05-07: qty 200

## 2014-05-07 MED ORDER — CEFTRIAXONE SODIUM 1 G IJ SOLR
1.0000 g | INTRAMUSCULAR | Status: DC
  Filled 2014-05-07: qty 10

## 2014-05-07 MED ORDER — ASPIRIN EC 81 MG PO TBEC
81.0000 mg | DELAYED_RELEASE_TABLET | Freq: Every day | ORAL | Status: DC
  Administered 2014-05-08 – 2014-05-09 (×2): 81 mg via ORAL
  Filled 2014-05-07 (×2): qty 1

## 2014-05-07 MED ORDER — GUAIFENESIN-CODEINE 100-10 MG/5ML PO SYRP
5.0000 mL | ORAL_SOLUTION | ORAL | Status: DC | PRN
  Administered 2014-05-08: 5 mL via ORAL

## 2014-05-07 MED ORDER — OSELTAMIVIR PHOSPHATE 75 MG PO CAPS
75.0000 mg | ORAL_CAPSULE | Freq: Two times a day (BID) | ORAL | Status: DC
  Administered 2014-05-08 – 2014-05-09 (×3): 75 mg via ORAL
  Filled 2014-05-07 (×7): qty 1

## 2014-05-07 MED ORDER — CARVEDILOL 6.25 MG PO TABS
6.2500 mg | ORAL_TABLET | Freq: Two times a day (BID) | ORAL | Status: DC
  Administered 2014-05-08 – 2014-05-09 (×4): 6.25 mg via ORAL
  Filled 2014-05-07 (×4): qty 1

## 2014-05-07 MED ORDER — DEXTROSE 5 % IV SOLN
1.0000 g | Freq: Once | INTRAVENOUS | Status: AC
  Administered 2014-05-07: 1 g via INTRAVENOUS

## 2014-05-07 MED ORDER — LISINOPRIL 10 MG PO TABS
10.0000 mg | ORAL_TABLET | Freq: Every day | ORAL | Status: DC
  Administered 2014-05-08 – 2014-05-09 (×2): 10 mg via ORAL
  Filled 2014-05-07 (×2): qty 1

## 2014-05-07 NOTE — ED Notes (Addendum)
Pt states she was seen at [facility] on Sunday and dx with the flu. States she does not feel any better. C/o Frequent productive cough, sob, and heaviness in chest. Spoke to MD today and was told to come to ED for further evaluation. Pt is alert with increased work of breathing noted at this time. Pt states she currently feels like an elephant is sitting on her chest with c/o left sided chest pain.

## 2014-05-07 NOTE — ED Provider Notes (Signed)
[facility] Emergency Department Provider Note    ____________________________________________  Time seen: 1000  I have reviewed the triage vital signs and the nursing notes.   HISTORY  Chief Complaint Shortness of Breath and Pleurisy      HPI Maria Neal is a 64 y.o. female patient was seen in urgent care and diagnosed with the flu several days ago patient reports continuing and increasing cough productive of green phlegm and shortness of breath and then developed a sensation L often sitting on her chest last night which has been there ever since early last night is not worse with exertion is slightly worse when she takes a deep breath patient denies any fever patient does say that she is increased at that her shortness of breath increases with exertion however the elephant is sits on the left side of her mid chest there is no radiation of pain there is no nausea vomiting or any sweating now seems to make it better or worse she reports she is on 1-1/2 L of oxygen at home she has been taking her Tamiflu and all her other medicines as she is instructed she has a history of pneumonia and this feels somewhat like previous pneumonia  Patient reports she gets hives with amoxicillin I discussed with her the possibility of cross reaction with ceftriaxone but the fact that this usually is not a large problem and we will go ahead and give her ceftriaxone patient says she has had Zithromax Z-Pak before she is not aware of any allergy and does not really know why that is on her chart   Past Medical History  Diagnosis Date  . Hypertension   . CHF (congestive heart failure)   . Cardiomyopathy   . CAD (coronary artery disease)   . Anxiety   . COPD (chronic obstructive pulmonary disease)   . Insomnia     There are no active problems to display for this patient.   Past Surgical History  Procedure Laterality Date  . Cardiac catheterization  04/2014    Dr. Idelle Leech   . Cardiac catheterization      Current Outpatient Rx  Name  Route  Sig  Dispense  Refill  . acetaminophen (TYLENOL) 500 MG tablet   Oral   Take 500 mg by mouth every 8 (eight) hours as needed.         Marland Kitchen aspirin 81 MG tablet   Oral   Take 81 mg by mouth daily.         . benzonatate (TESSALON) 200 MG capsule   Oral   Take 1 capsule (200 mg total) by mouth 3 (three) times daily as needed for cough.   30 capsule   0   . carvedilol (COREG) 6.25 MG tablet   Oral   Take 6.25 mg by mouth 4 (four) times daily.         . furosemide (LASIX) 20 MG tablet   Oral   Take 20 mg by mouth daily.         Marland Kitchen lisinopril (PRINIVIL,ZESTRIL) 10 MG tablet   Oral   Take 10 mg by mouth daily.         Marland Kitchen oseltamivir (TAMIFLU) 75 MG capsule   Oral   Take 1 capsule (75 mg total) by mouth every 12 (twelve) hours.   10 capsule   0     Allergies Amoxicillin; Levaquin; Nitrofuran derivatives; Penicillins; and Zithromax  Family History  Problem Relation Age of Onset  .  Diabetes Mellitus II Mother   . Heart attack Father   . Arthritis Sister   . Heart disease Brother     Social History History  Substance Use Topics  . Smoking status: Never Smoker   . Smokeless tobacco: Not on file  . Alcohol Use: No    Review of Systems  Constitutional: Negative for fever. Eyes: Negative for visual changes. ENT: Negative for sore throat. Gastrointestinal: Negative for abdominal pain, vomiting and diarrhea. Genitourinary: Negative for dysuria. Musculoskeletal: Negative for back pain. Skin: Negative for rash. Neurological: Negative for headaches, focal weakness or numbness.  10-point ROS otherwise negative.  ____________________________________________   PHYSICAL EXAM:  VITAL SIGNS: ED Triage Vitals  Enc Vitals Group     BP 05/07/14 2118 145/58 mmHg     Pulse Rate 05/07/14 2118 67     Resp 05/07/14 2118 22     Temp 05/07/14 2118 97.8 F (36.6 C)     Temp Source 05/07/14 2118  Oral     SpO2 05/07/14 2114 90 %     Weight 05/07/14 2118 194 lb (87.998 kg)     Height 05/07/14 2118 5\' 4"  (1.626 m)     Head Cir --      Peak Flow --      Pain Score 05/07/14 2118 7     Pain Loc --      Pain Edu? --      Excl. in [place]? --      Constitutional: Alert and oriented. Well appearing and in no distress. Eyes: Conjunctivae are normal. PERRL. Normal extraocular movements. ENT   Head: Normocephalic and atraumatic.   Nose: No congestion/rhinnorhea.   Mouth/Throat: Mucous membranes are moist.   Neck: No stridor. Hematological/Lymphatic/Immunilogical: No cervical lymphadenopathy. Cardiovascular: Normal rate, regular rhythm. Normal and symmetric distal pulses are present in all extremities. No murmurs, rubs, or gallops. Respiratory: She is a somewhat increased respiratory effort with some use of accessory muscles she has scattered wheezes throughout and some crackles on the left side Gastrointestinal: Soft and nontender. No distention. No abdominal bruits. There is no CVA tenderness. Musculoskeletal: Nontender with normal range of motion in all extremities. No joint effusions.  No lower extremity tenderness nor edema. Neurologic:  Normal speech and language. No gross focal neurologic deficits are appreciated. Speech is normal. No gait instability. Skin:  Skin is warm, dry and intact. No rash noted. Psychiatric: Mood and affect are normal. Speech and behavior are normal. Patient exhibits appropriate insight and judgment.  ____________________________________________    LABS (pertinent positives/negatives)    ____________________________________________   EKG  There are no old EKGs available at the present time patient has flipped T's in serially and in the lateral chest leads on the current EKG  ____________________________________________    RADIOLOGY chest x-ray shows left-sided infiltrate per radiology and my review  ____________________________________________   PROCEDURES  Procedure(s) performed: None  Critical Care performed: No  ____________________________________________   INITIAL IMPRESSION / ASSESSMENT AND PLAN / ED COURSE  Pertinent labs & imaging results that were available during my care of the patient were reviewed by me and considered in my medical decision making (see chart for details).    ____________________________________________   FINAL CLINICAL IMPRESSION(S) / ED DIAGNOSES  Final diagnoses:  Community acquired pneumonia  Abnormal EKG  Chest pain on breathing   [facility] Emergency Department Provider Note   ____________________________________________  [facility] Emergency Department Provider Note    ____________________________________________  Time seen:  I have reviewed the triage vital signs and the nursing notes.   HISTORY  Chief Complaint Shortness of Breath and Pleurisy        HPI       Past Medical History  Diagnosis Date  . Hypertension   . CHF (congestive heart failure)   . Cardiomyopathy   . CAD (coronary artery disease)   . Anxiety   . COPD (chronic obstructive pulmonary disease)   . Insomnia     There are no active problems to display for this patient.   Past Surgical History  Procedure Laterality Date  . Cardiac catheterization  04/2014    Dr. Idelle Leech  . Cardiac catheterization      Current Outpatient Rx  Name  Route  Sig  Dispense  Refill  . acetaminophen (TYLENOL) 500 MG tablet   Oral   Take 500 mg by mouth every 8 (eight) hours as needed.         Marland Kitchen aspirin 81 MG tablet   Oral   Take 81 mg by mouth daily.         . benzonatate (TESSALON) 200 MG capsule   Oral   Take 1 capsule (200 mg total) by mouth 3 (three) times daily as needed for cough.   30 capsule   0   . carvedilol (COREG) 6.25 MG tablet   Oral   Take 6.25 mg by mouth 4 (four)  times daily.         . furosemide (LASIX) 20 MG tablet   Oral   Take 20 mg by mouth daily.         Marland Kitchen lisinopril (PRINIVIL,ZESTRIL) 10 MG tablet   Oral   Take 10 mg by mouth daily.         Marland Kitchen oseltamivir (TAMIFLU) 75 MG capsule   Oral   Take 1 capsule (75 mg total) by mouth every 12 (twelve) hours.   10 capsule   0     Allergies Amoxicillin; Levaquin; Nitrofuran derivatives; Penicillins; and Zithromax  Family History  Problem Relation Age of Onset  . Diabetes Mellitus II Mother   . Heart attack Father   . Arthritis Sister   . Heart disease Brother     Social History History  Substance Use Topics  . Smoking status: Never Smoker   . Smokeless tobacco: Not on file  . Alcohol Use: No    Review of Systems   Constitutional: Negative for fever. Eyes: Negative for visual changes. ENT: Negative for sore throat. Cardiovascular: Negative for chest pain. Respiratory: Negative for shortness of breath. Gastrointestinal: Negative for abdominal pain, vomiting and diarrhea. Genitourinary: Negative for dysuria. Musculoskeletal: Negative for back pain. Skin: Negative for rash. Neurological: Negative for headaches, focal weakness or numbness.    10-point ROS otherwise negative.  ____________________________________________   PHYSICAL EXAM:  VITAL SIGNS: ED Triage Vitals  Enc Vitals Group     BP 05/07/14 2118 145/58 mmHg     Pulse Rate 05/07/14 2118 67     Resp 05/07/14 2118 22     Temp 05/07/14 2118 97.8 F (36.6 C)     Temp Source 05/07/14 2118 Oral     SpO2 05/07/14 2114 90 %     Weight 05/07/14 2118 194 lb (87.998 kg)     Height 05/07/14 2118 5\' 4"  (1.626 m)     Head Cir --      Peak Flow --      Pain Score 05/07/14 2118 7  Pain Loc --      Pain Edu? --      Excl. in [place]? --       Constitutional: Alert and oriented. Well appearing and in no distress. Eyes: Conjunctivae are normal. PERRL. Normal extraocular movements. ENT   Head:  Normocephalic and atraumatic.   Nose: No congestion/rhinnorhea.   Mouth/Throat: Mucous membranes are moist.   Neck: No stridor. Hematological/Lymphatic/Immunilogical: No cervical lymphadenopathy. Cardiovascular: Normal rate, regular rhythm. Normal and symmetric distal pulses are present in all extremities. No murmurs, rubs, or gallops. Respiratory: Normal respiratory effort without tachypnea nor retractions. Breath sounds are clear and equal bilaterally. No wheezes/rales/rhonchi. Gastrointestinal: Soft and nontender. No distention. No abdominal bruits. There is no CVA tenderness. Genitourinary:   Musculoskeletal: Nontender with normal range of motion in all extremities. No joint effusions.  No lower extremity tenderness nor edema. Neurologic:  Normal speech and language. No gross focal neurologic deficits are appreciated. Speech is normal. No gait instability. Skin:  Skin is warm, dry and intact. No rash noted. Psychiatric: Mood and affect are normal. Speech and behavior are normal. Patient exhibits appropriate insight and judgment.  ____________________________________________    LABS (pertinent positives/negatives)     ____________________________________________   EKG     ____________________________________________    RADIOLOGY     ____________________________________________   PROCEDURES  Procedure(s) performed: None  Critical Care performed: No     ____________________________________________   INITIAL IMPRESSION / ASSESSMENT AND PLAN / ED COURSE  Pertinent labs & imaging results that were available during my care of the patient were reviewed by me and considered in my medical decision making (see chart for details).    ____________________________________________   FINAL CLINICAL IMPRESSION(S) / ED DIAGNOSES  Final diagnoses:  Community acquired pneumonia  Abnormal EKG  Chest pain on breathing    Nena Polio, MD 05/07/14  2231

## 2014-05-07 NOTE — H&P (Signed)
[place] at [place] NAME: Maria Neal    MR#:  [id number]  DATE OF BIRTH:  1950/02/19   DATE OF ADMISSION:  05/07/2014  PRIMARY CARE PHYSICIAN: BLISS, Lynnell Jude, MD   REQUESTING/REFERRING PHYSICIAN: Malinda  CHIEF COMPLAINT:   Chief Complaint  Patient presents with  . Shortness of Breath  . Pleurisy    HISTORY OF PRESENT ILLNESS:  Maria Neal  is a 63 y.o. female with a known history of COPD noncompliant with home oxygen, essential hypertension, coronary artery disease who is presenting with shortness of breath. She is complaining of 4 days duration and shortness of breath mainly has dyspnea on exertion, with associated cough productive of greenish sputum as well as subjective fevers chills. She was evaluated in urgent care on 05/03/2014 diagnosed with influenza be started on Tamiflu therapy. Given worsening symptoms presents possible further workup and evaluation prior to this she was seen by her PCP noted to be hypoxemic the [facility] further workup and evaluation  PAST MEDICAL HISTORY:   Past Medical History  Diagnosis Date  . Hypertension   . CHF (congestive heart failure)   . Cardiomyopathy   . CAD (coronary artery disease)   . Anxiety   . COPD (chronic obstructive pulmonary disease)   . Insomnia     PAST SURGICAL HISTORY:   Past Surgical History  Procedure Laterality Date  . Cardiac catheterization  04/2014    Dr. Idelle Leech  . Cardiac catheterization      SOCIAL HISTORY:   History  Substance Use Topics  . Smoking status: Former Research scientist (life sciences)  . Smokeless tobacco: Not on file  . Alcohol Use: No    FAMILY HISTORY:   Family History  Problem Relation Age of Onset  . Diabetes Mellitus II Mother   . Heart attack Father   . Arthritis Sister   . Heart disease Brother     DRUG ALLERGIES:   Allergies  Allergen Reactions  . Levaquin [Levofloxacin] Anaphylaxis  . Amoxicillin Hives  . Nitrofuran  Derivatives Other (See Comments)    Reaction: unknown  . Penicillins Other (See Comments)    Thinks it made her itch a lot, but isn't sure.    REVIEW OF SYSTEMS:  REVIEW OF SYSTEMS:  CONSTITUTIONAL: Positive for subjective fevers, chills, fatigue, weakness.  EYES: Denies blurred vision, double vision, or eye pain.  EARS, NOSE, THROAT: Denies tinnitus, ear pain, hearing loss.  RESPIRATORY: Positive cough, shortness of breath, wheezing , as stated above CARDIOVASCULAR: Denies chest pain, palpitations, edema.  GASTROINTESTINAL: Denies nausea, vomiting, diarrhea, abdominal pain.  GENITOURINARY: Denies dysuria, hematuria.  ENDOCRINE: Denies nocturia or thyroid problems. HEMATOLOGIC AND LYMPHATIC: Denies easy bruising or bleeding.  SKIN: Denies rash or lesions.  MUSCULOSKELETAL: Denies pain in neck, back, shoulder, knees, hips, or further arthritic symptoms.  NEUROLOGIC: Denies paralysis, paresthesias.  PSYCHIATRIC: Denies anxiety or depressive symptoms. Otherwise full review of systems performed by me is negative.   MEDICATIONS AT HOME:   Prior to Admission medications   Medication Sig Start Date End Date Taking? Authorizing Provider  acetaminophen (TYLENOL) 500 MG tablet Take 500-1,000 mg by mouth every 8 (eight) hours as needed for mild pain or headache.    Yes Historical Provider, MD  aspirin EC 81 MG tablet Take 81 mg by mouth daily.   Yes Historical Provider, MD  carvedilol (COREG) 6.25 MG tablet Take 6.25 mg by mouth 2 (two) times daily.    Yes Historical Provider, MD  furosemide (LASIX) 20 MG tablet Take 20 mg by mouth daily.   Yes Historical Provider, MD  lisinopril (PRINIVIL,ZESTRIL) 10 MG tablet Take 10 mg by mouth daily.   Yes Historical Provider, MD  oseltamivir (TAMIFLU) 75 MG capsule Take 1 capsule (75 mg total) by mouth every 12 (twelve) hours. 05/03/14  Yes Olen Cordial, NP  benzonatate (TESSALON) 200 MG capsule Take 1 capsule (200 mg total) by mouth 3 (three) times  daily as needed for cough. Patient not taking: Reported on 05/07/2014 05/03/14   Olen Cordial, NP      VITAL SIGNS:  Blood pressure 153/73, pulse 69, temperature 97.8 F (36.6 C), temperature source Oral, resp. rate 21, height 5\' 4"  (1.626 m), weight 194 lb (87.998 kg), SpO2 93 %.  PHYSICAL EXAMINATION:  VITAL SIGNS: Filed Vitals:   05/07/14 2300  BP: 153/73  Pulse: 69  Temp:   Resp: 21   GENERAL:63 y.o.female currently in no acute distress.  HEAD: Normocephalic, atraumatic.  EYES: Pupils equal, round, reactive to light. Extraocular muscles intact. No scleral icterus.  MOUTH: Moist mucosal membrane. Dentition intact. No abscess noted.  EAR, NOSE, THROAT: Clear without exudates. No external lesions.  NECK: Supple. No thyromegaly. No nodules. No JVD.  PULMONARY: Left basilar rhonchi, scant wheeze . No use of accessory muscles, Good respiratory effort. Poor air entry bilaterally CHEST: Nontender to palpation.  CARDIOVASCULAR: S1 and S2. Regular rate and rhythm. No murmurs, rubs, or gallops. No edema. Pedal pulses 2+ bilaterally.  GASTROINTESTINAL: Soft, nontender, nondistended. No masses. Positive bowel sounds. No hepatosplenomegaly.  MUSCULOSKELETAL: No swelling, clubbing, or edema. Range of motion full in all extremities.  NEUROLOGIC: Cranial nerves II through XII are intact. No gross focal neurological deficits. Sensation intact. Reflexes intact.  SKIN: No ulceration, lesions, rashes, or cyanosis. Skin warm and dry. Turgor intact.  PSYCHIATRIC: Mood, affect within normal limits. The patient is awake, alert and oriented x 3. Insight, judgment intact.    LABORATORY PANEL:   CBC  Recent Labs Lab 05/07/14 2227  WBC 11.8*  HGB 14.0  HCT 42.8  PLT 160   ------------------------------------------------------------------------------------------------------------------  Chemistries   Recent Labs Lab 05/07/14 2150  NA 140  K 4.1  CL 100*  CO2 29  GLUCOSE 134*  BUN 16   CREATININE 0.62  CALCIUM 9.4  AST 26  ALT 13*  ALKPHOS 55  BILITOT 0.5   ------------------------------------------------------------------------------------------------------------------  Cardiac Enzymes  Recent Labs Lab 05/07/14 2150  TROPONINI <0.03   ------------------------------------------------------------------------------------------------------------------  RADIOLOGY:  Dg Chest 2 View  05/07/2014   CLINICAL DATA:  Shortness of Breath  EXAM: CHEST  2 VIEW  COMPARISON:  05/03/2014  FINDINGS: Cardiomegaly again noted. No pulmonary edema. There is patchy infiltrate left midlung highly suspicious for pneumonia. Follow-up to resolution after treatment is recommended. Osteopenia and mild degenerative changes thoracic spine.  IMPRESSION: Patchy infiltrate in left midlung suspicious for pneumonia. Follow-up to resolution is recommended.   Electronically Signed   By: Lahoma Crocker M.D.   On: 05/07/2014 22:03    EKG:   Orders placed or performed during the hospital encounter of 05/07/14  . ED EKG  . ED EKG    IMPRESSION AND PLAN:   64 year old Caucasian female history of COPD not O2 compliant presenting with shortness of breath recently diagnosed with influenza be started on Tamiflu.  1. Community acquired pneumonia: Provide supplemental O2 to keep SaO2 greater than 92%, DuoNeb treatments every 4 hours, antibiotic coverage with azithromycin as well as ceftriaxone, Solu-Medrol 60  mg IV daily. 2. Hypertension essential: Continue lisinopril as well as Coreg. 3. COPD, unspecified type: Continue with home medications in addition to DuoNeb treatments and Solu-Medrol stated above. 4. Coronary artery disease: Continue aspirin therapy and. 5. Venous thromboembolism prophylactic: Heparin subcutaneous    All the records are reviewed and case discussed with ED provider. Management plans discussed with the patient, family and they are in agreement.  CODE STATUS: Full  TOTAL TIME  TAKING CARE OF THIS PATIENT: 45 minutes.    Laddie Naeem,  Karenann Cai.D on 05/07/2014 at 11:35 PM  Between 7am to 6pm - Pager - [phone]  After 6pm go to [email or web address] - password [email or web address] [facility]  Office  [phone]  CC: Primary care physician; Lynnell Jude, MD

## 2014-05-08 LAB — CBC
HEMATOCRIT: 38.8 % (ref 35.0–47.0)
Hemoglobin: 12.8 g/dL (ref 12.0–16.0)
MCH: 28.7 pg (ref 26.0–34.0)
MCHC: 32.8 g/dL (ref 32.0–36.0)
MCV: 87.3 fL (ref 80.0–100.0)
PLATELETS: 126 10*3/uL — AB (ref 150–440)
RBC: 4.45 MIL/uL (ref 3.80–5.20)
RDW: 14.9 % — AB (ref 11.5–14.5)
WBC: 9 10*3/uL (ref 3.6–11.0)

## 2014-05-08 LAB — BASIC METABOLIC PANEL
Anion gap: 7 (ref 5–15)
BUN: 13 mg/dL (ref 6–20)
CHLORIDE: 101 mmol/L (ref 101–111)
CO2: 29 mmol/L (ref 22–32)
CREATININE: 0.54 mg/dL (ref 0.44–1.00)
Calcium: 8.6 mg/dL — ABNORMAL LOW (ref 8.9–10.3)
GFR calc non Af Amer: >60 mL/min (ref >60)
Glucose, Bld: 133 mg/dL — ABNORMAL HIGH (ref 65–99)
Potassium: 4 mmol/L (ref 3.5–5.1)
Sodium: 137 mmol/L (ref 135–145)

## 2014-05-08 MED ORDER — DEXTROSE 5 % IV SOLN
500.0000 mg | INTRAVENOUS | Status: DC
  Administered 2014-05-08: 500 mg via INTRAVENOUS
  Filled 2014-05-08 (×2): qty 500

## 2014-05-08 MED ORDER — GUAIFENESIN-CODEINE 100-10 MG/5ML PO SOLN
ORAL | Status: AC
  Administered 2014-05-08: 5 mL via ORAL
  Filled 2014-05-08: qty 5

## 2014-05-08 MED ORDER — ALPRAZOLAM 1 MG PO TABS
1.0000 mg | ORAL_TABLET | Freq: Once | ORAL | Status: AC
  Administered 2014-05-08: 1 mg via ORAL
  Filled 2014-05-08: qty 1

## 2014-05-08 MED ORDER — FUROSEMIDE 20 MG PO TABS
20.0000 mg | ORAL_TABLET | Freq: Every day | ORAL | Status: DC
  Administered 2014-05-08 – 2014-05-09 (×2): 20 mg via ORAL
  Filled 2014-05-08 (×4): qty 1

## 2014-05-08 MED ORDER — DIPHENHYDRAMINE HCL 25 MG PO CAPS
25.0000 mg | ORAL_CAPSULE | ORAL | Status: DC | PRN
  Administered 2014-05-08: 25 mg via ORAL
  Filled 2014-05-08: qty 1

## 2014-05-08 MED ORDER — CEFTRIAXONE SODIUM 1 G IJ SOLR
1.0000 g | INTRAMUSCULAR | Status: DC
  Administered 2014-05-08: 1 g via INTRAVENOUS
  Filled 2014-05-08 (×2): qty 10

## 2014-05-08 MED ORDER — METHYLPREDNISOLONE SODIUM SUCC 40 MG IJ SOLR
40.0000 mg | INTRAMUSCULAR | Status: DC
  Administered 2014-05-09: 40 mg via INTRAVENOUS
  Filled 2014-05-08: qty 1

## 2014-05-08 NOTE — Progress Notes (Signed)
ICU called to report pt had a 17 beat run of SVT. Pt was asymptomatic and up in bathroom.

## 2014-05-08 NOTE — Progress Notes (Signed)
rn notified

## 2014-05-08 NOTE — Care Management Note (Signed)
Case Management Note  Patient Details  Name: Maria Neal MRN: [id number] Date of Birth: 11-Nov-1950  Subjective/Objective:                  COPD RNCM Consult: Patient resting in bed with sister at bedside. She states she in on chronic O2 at home through [place]. Her PCP is Dr. Clemmie Krill of [place], [place]. She gets her Rx from [facility] and denies difficultly with obtaining meds or transportation. She states she is independent with ambulation. She has used [facility] in the past.   Action/Plan:  RNCM to continue to follow for discharge needs. None noted at this time.  Expected Discharge Date:                  Expected Discharge Plan:     In-House Referral:     Discharge planning Services  CM Consult  Post Acute Care Choice:    Choice offered to:  Patient, Sibling  DME Arranged:    DME Agency:  NA  HH Arranged:    [place] Agency:     Status of Service:  In process, will continue to follow  Medicare Important Message Given:  Yes Date Medicare IM Given:  05/08/14 Medicare IM give by:  [name] Date Additional Medicare IM Given:    Additional Medicare Important Message give by:     If discussed at [place] of Stay Meetings, dates discussed:    Additional Comments:  [name], RN 05/08/2014, 8:49 AM

## 2014-05-08 NOTE — Progress Notes (Signed)
Romie Minus receiving IV ABX, afebrile. C/o itching and anxiety controlled with PRN medications. Denies pain. Resting well with family at bedside through the night. Nursing continues to monitor and assist with ADLs.

## 2014-05-08 NOTE — Progress Notes (Signed)
[place] at [place] NAME: Maria Neal    MR#:  [id number]  DATE OF BIRTH:  1950-09-28  SUBJECTIVE:  Patient here with SOB and found to have PNA. SH eis feeling better then admission. + cough   REVIEW OF SYSTEMS:    Review of Systems  Constitutional: Positive for malaise/fatigue. Negative for fever and chills.  Respiratory: Positive for cough, sputum production and shortness of breath. Negative for hemoptysis.   Cardiovascular: Negative for chest pain and palpitations.  Gastrointestinal: Positive for heartburn. Negative for nausea.  Neurological: Negative for tremors and headaches.  Psychiatric/Behavioral: Negative for depression.    Tolerating Diet:YES      DRUG ALLERGIES:   Allergies  Allergen Reactions  . Levaquin [Levofloxacin] Anaphylaxis  . Amoxicillin Hives  . Nitrofuran Derivatives Other (See Comments)    Reaction: unknown  . Penicillins Other (See Comments)    Thinks it made her itch a lot, but isn't sure.    VITALS:  Blood pressure 117/46, pulse 74, temperature 97.4 F (36.3 C), temperature source Oral, resp. rate 19, height 5\' 4"  (1.626 m), weight 87.998 kg (194 lb), SpO2 93 %.  PHYSICAL EXAMINATION:   Physical Exam    LABORATORY PANEL:   CBC  Recent Labs Lab 05/08/14 0428  WBC 9.0  HGB 12.8  HCT 38.8  PLT 126*   ------------------------------------------------------------------------------------------------------------------  Chemistries   Recent Labs Lab 05/07/14 2150 05/08/14 0428  NA 140 137  K 4.1 4.0  CL 100* 101  CO2 29 29  GLUCOSE 134* 133*  BUN 16 13  CREATININE 0.62 0.54  CALCIUM 9.4 8.6*  AST 26  --   ALT 13*  --   ALKPHOS 55  --   BILITOT 0.5  --    ------------------------------------------------------------------------------------------------------------------  Cardiac Enzymes  Recent Labs Lab 05/07/14 2150  TROPONINI <0.03    ------------------------------------------------------------------------------------------------------------------  RADIOLOGY:  Dg Chest 2 View  05/07/2014   CLINICAL DATA:  Shortness of Breath  EXAM: CHEST  2 VIEW  COMPARISON:  05/03/2014  FINDINGS: Cardiomegaly again noted. No pulmonary edema. There is patchy infiltrate left midlung highly suspicious for pneumonia. Follow-up to resolution after treatment is recommended. Osteopenia and mild degenerative changes thoracic spine.  IMPRESSION: Patchy infiltrate in left midlung suspicious for pneumonia. Follow-up to resolution is recommended.   Electronically Signed   By: Lahoma Crocker M.D.   On: 05/07/2014 22:03     ASSESSMENT AND PLAN:  64 year old Caucasian female history of COPD not O2 compliant presenting with shortness of breath recently diagnosed with influenza be started on Tamiflu.  1. Community acquired pneumonia: Provide supplemental O2 to keep SaO2 greater than 92%, DuoNeb treatments every 4 hours, antibiotic coverage with azithromycin and ceftriaxone. I will wean steroids.I will continue Tamiflu for influenza  2. Hypertension essential: Continue lisinopril and Coreg. 3. COPD with chronic resp failure: Continue with home medications in addition to DuoNeb treatments and Solu-Medrol stated above. She is on 2.5 [place] at home O2 4. Coronary artery disease: Continue aspirin therapy and Coreg adn Lisinopril      All the records are reviewed and case discussed with Care Management/Social Worker. Management plans discussed with the patient and she is in agreement.  CODE STATUS: FULL  TOTAL TIME TAKING CARE OF THIS PATIENT: 35 minutes.   POSSIBLE D/C IN 1-2 DAYS, DEPENDING ON CLINICAL CONDITION.   Drayden Lukas M.D on 05/08/2014 at 11:08 AM  Between 7am to 6pm - Pager - [phone] After 6pm go  to [email or web address] - password [email or web address] [facility]  Office  [phone]  CC: Primary care physician; Lyn[facility] Jude, MD

## 2014-05-08 NOTE — Progress Notes (Signed)
Pt itching and anxious. MD notified, Dr Lavetta Nielsen ordered benadryl and xanax

## 2014-05-09 DIAGNOSIS — J1100 Influenza due to unidentified influenza virus with unspecified type of pneumonia: Secondary | ICD-10-CM | POA: Diagnosis not present

## 2014-05-09 MED ORDER — PNEUMOCOCCAL VAC POLYVALENT 25 MCG/0.5ML IJ INJ
0.5000 mL | INJECTION | INTRAMUSCULAR | Status: AC
  Administered 2014-05-09: 0.5 mL via INTRAMUSCULAR

## 2014-05-09 MED ORDER — PNEUMOCOCCAL VAC POLYVALENT 25 MCG/0.5ML IJ INJ
INJECTION | INTRAMUSCULAR | Status: AC
  Administered 2014-05-09: 0.5 mL via INTRAMUSCULAR
  Filled 2014-05-09: qty 0.5

## 2014-05-09 MED ORDER — AZITHROMYCIN 500 MG PO TABS: 500.0000 mg | ORAL_TABLET | Freq: Every day | ORAL | Status: DC

## 2014-05-09 NOTE — Progress Notes (Signed)
Patient d/c home, already has advanced home care set up. O2 at home already. Instructions and prescriptions given to patient, verbalized understanding. Discharged home via wheelchair and family member. IV removed.

## 2014-05-09 NOTE — Progress Notes (Signed)
Pt plan to be discharged home today. Continues on Isolation (droplet) for positive flu. On Tamiflu and still has an occasional cough but afebrile. Two family members remain at bedside this AM. IV ABX tx. No further runs of SVT this shift after initial incident that was reported to on call MD last pm. Has been SR to SB on the monitor. Sats in mid 90s on 2L. Uses oxygen when pt ambulates to the bathroom and sits up in recliner.

## 2014-05-09 NOTE — Progress Notes (Signed)
Informed Dr. Lavetta Nielsen about 17 beat run of SVT at 2253 that was asymptomatic and pt was up in bathroom. MD not concerned at this time. Continue to monitor. Pt remains NSR.

## 2014-05-09 NOTE — Discharge Summary (Signed)
[place] at [place] NAME: Maria Neal    MR#:  [id number]  DATE OF BIRTH:  01-Sep-1950  DATE OF ADMISSION:  05/07/2014 ADMITTING PHYSICIAN: Lytle Butte, MD  DATE OF DISCHARGE: 05/09/2014  PRIMARY CARE PHYSICIAN: BLISS, Lyn[facility] Jude, MD    ADMISSION DIAGNOSIS:  Abnormal EKG [R94.31] Community acquired pneumonia [J18.9] Chest pain on breathing [R07.1]  DISCHARGE DIAGNOSIS:  Principal Problem:   CAP (community acquired pneumonia) Active Problems:   Benign essential HTN   CAD (coronary artery disease)   COPD (chronic obstructive pulmonary disease)   SECONDARY DIAGNOSIS:   Past Medical History  Diagnosis Date  . Hypertension   . CHF (congestive heart failure)   . Cardiomyopathy   . CAD (coronary artery disease)   . Anxiety   . COPD (chronic obstructive pulmonary disease)   . Insomnia     HOSPITAL COURSE:  Maria Neal is a 64 y.o. female with a known history of COPD noncompliant with home oxygen, essential hypertension, coronary artery disease who is presenting with shortness of breath. She is complaining of 4 days duration and shortness of breath mainly has dyspnea on exertion, with associated cough productive of greenish sputum as well as subjective fevers chills. She was evaluated in urgent care on 05/03/2014 diagnosed with influenza be started on Tamiflu therapy. Given worsening symptoms presents possible further workup and evaluation prior to this she was seen by her PCP noted to be hypoxemic the [facility] further workup and evaluation.  1. Continue prior pneumonia ": Patient was placed on antibiotics including azithromycin and cefuroxime. She had a recent diagnosis of influenza she was also continued on Tamiflu. She was provided supplemental oxygen to keep saturations greater than 92%. Patient was actually doing quite well on these antibiotics. She wears oxygen at home which she will continue. She will continue Tamiflu for  influenza.   2. Essential hypertension: Patient will continue lisinopril and Coreg.   3.history COPD with chronic respiratory failure on 2 and half liters of oxygen at home: She was continued on her outpatient medications and oxygen.   4. History of coronary artery disease: Patient continue aspirin Corag and lisinopril.  DISCHARGE CONDITIONS AND DIET:  Heart healthy diet Stable  CONSULTS OBTAINED:    none DRUG ALLERGIES:   Allergies  Allergen Reactions  . Levaquin [Levofloxacin] Anaphylaxis  . Amoxicillin Hives  . Nitrofuran Derivatives Other (See Comments)    Reaction: unknown  . Penicillins Other (See Comments)    Thinks it made her itch a lot, but isn't sure.    DISCHARGE MEDICATIONS:   Current Discharge Medication List    START taking these medications   Details  azithromycin (ZITHROMAX) 500 MG tablet Take 1 tablet (500 mg total) by mouth daily. Qty: 5 tablet, Refills: 0      CONTINUE these medications which have NOT CHANGED   Details  acetaminophen (TYLENOL) 500 MG tablet Take 500-1,000 mg by mouth every 8 (eight) hours as needed for mild pain or headache.     aspirin EC 81 MG tablet Take 81 mg by mouth daily.    carvedilol (COREG) 6.25 MG tablet Take 6.25 mg by mouth 2 (two) times daily.     Epigallocatechin Gallate (GREEN TEA) 95 % POWD 1 packet by Does not apply route daily.    furosemide (LASIX) 20 MG tablet Take 20 mg by mouth daily.    guaiFENesin-codeine (ROBITUSSIN AC) 100-10 MG/5ML syrup Take 5 mLs by mouth 3 (three)  times daily as needed for cough.    lisinopril (PRINIVIL,ZESTRIL) 10 MG tablet Take 10 mg by mouth daily.    oseltamivir (TAMIFLU) 75 MG capsule Take 1 capsule (75 mg total) by mouth every 12 (twelve) hours. Qty: 10 capsule, Refills: 0    benzonatate (TESSALON) 200 MG capsule Take 1 capsule (200 mg total) by mouth 3 (three) times daily as needed for cough. Qty: 30 capsule, Refills: 0              Today   CHIEF  COMPLAINT:  Patient is feeling well this morning she wants to go home.   VITAL SIGNS:  Blood pressure 128/70, pulse 80, temperature 97.1 F (36.2 C), temperature source Oral, resp. rate 19, height 5\' 4"  (1.626 m), weight 87.998 kg (194 lb), SpO2 94 %.   REVIEW OF SYSTEMS:  Review of Systems  Constitutional: Negative for fever and chills.  Respiratory: Positive for cough. Negative for hemoptysis, sputum production, shortness of breath and wheezing.   Gastrointestinal: Negative for heartburn and diarrhea.  Genitourinary: Negative for urgency.  All other systems reviewed and are negative.    PHYSICAL EXAMINATION:  GENERAL:  64 y.o.-year-old patient lying in the bed with no acute distress.  NECK:  Supple, no jugular venous distention. No thyroid enlargement, no tenderness.  LUNGS: She has no use of S S3 muscles. She has minimal crackles at the left lung base.Marland Kitchen CARDIOVASCULAR: S1, S2 normal. No murmurs, rubs, or gallops.  ABDOMEN: Soft, non-tender, non-distended. Bowel sounds present. No organomegaly or mass.  EXTREMITIES: No pedal edema, cyanosis, or clubbing.  PSYCHIATRIC: The patient is alert and oriented x 3.  SKIN: No obvious rash, lesion, or ulcer.   DATA REVIEW:   CBC  Recent Labs Lab 05/08/14 0428  WBC 9.0  HGB 12.8  HCT 38.8  PLT 126*    Chemistries   Recent Labs Lab 05/07/14 2150 05/08/14 0428  NA 140 137  K 4.1 4.0  CL 100* 101  CO2 29 29  GLUCOSE 134* 133*  BUN 16 13  CREATININE 0.62 0.54  CALCIUM 9.4 8.6*  AST 26  --   ALT 13*  --   ALKPHOS 55  --   BILITOT 0.5  --     Cardiac Enzymes  Recent Labs Lab 05/07/14 2150  TROPONINI <0.03    Microbiology Results  Results for orders placed or performed during the hospital encounter of 05/07/14  Culture, blood (routine x 2)     Status: None (Preliminary result)   Collection Time: 05/07/14 10:20 PM  Result Value Ref Range Status   Specimen Description BLOOD RIGHT WRIST  Final   Special  Requests Normal  Final   Culture NO GROWTH 2 DAYS  Final   Report Status PENDING  Incomplete  Culture, blood (routine x 2)     Status: None (Preliminary result)   Collection Time: 05/07/14 10:28 PM  Result Value Ref Range Status   Specimen Description BLOOD  Final   Special Requests Normal  Final   Culture NO GROWTH 2 DAYS  Final   Report Status PENDING  Incomplete    RADIOLOGY:  Dg Chest 2 View  05/07/2014   CLINICAL DATA:  Shortness of Breath  EXAM: CHEST  2 VIEW  COMPARISON:  05/03/2014  FINDINGS: Cardiomegaly again noted. No pulmonary edema. There is patchy infiltrate left midlung highly suspicious for pneumonia. Follow-up to resolution after treatment is recommended. Osteopenia and mild degenerative changes thoracic spine.  IMPRESSION: Patchy infiltrate in left midlung  suspicious for pneumonia. Follow-up to resolution is recommended.   Electronically Signed   By: Lahoma Crocker M.D.   On: 05/07/2014 22:03      Management plans discussed with the patient and she is in agreement. Stable for discharge home  CODE STATUS:     Code Status Orders        Start     Ordered   05/07/14 2320  Full code   Continuous     05/07/14 2321      TOTAL TIME TAKING CARE OF THIS PATIENT: 35 minutes.    Ibraham Levi M.D on 05/09/2014 at 11:50 AM  Between 7am to 6pm - Pager - [phone] After 6pm go to [email or web address] - password [email or web address] [facility]  Office  [phone]  CC: Primary care physician; [facility] Jude, MD

## 2014-05-12 LAB — CULTURE, BLOOD (ROUTINE X 2)
Culture: NO GROWTH
Culture: NO GROWTH
SPECIAL REQUESTS: NORMAL
SPECIAL REQUESTS: NORMAL

## 2014-05-18 ENCOUNTER — Ambulatory Visit
Admission: RE | Admit: 2014-05-18 | Discharge: 2014-05-18 | Disposition: A | Discharge status: Home / Self Care | Payer: Medicare Other | Source: Ambulatory Visit | Attending: Family Medicine | Admitting: Family Medicine

## 2014-05-18 ENCOUNTER — Other Ambulatory Visit: Payer: Self-pay | Admitting: Family Medicine

## 2014-05-18 DIAGNOSIS — J189 Pneumonia, unspecified organism: Secondary | ICD-10-CM | POA: Diagnosis present

## 2014-09-02 ENCOUNTER — Encounter: Payer: Self-pay | Admitting: *Deleted

## 2014-09-02 ENCOUNTER — Emergency Department: Payer: Medicare Other

## 2014-09-02 ENCOUNTER — Emergency Department
Admission: EM | Admit: 2014-09-02 | Discharge: 2014-09-02 | Disposition: A | Discharge status: Home / Self Care | Payer: Medicare Other | Attending: Emergency Medicine | Admitting: Emergency Medicine

## 2014-09-02 ENCOUNTER — Other Ambulatory Visit: Payer: Self-pay

## 2014-09-02 DIAGNOSIS — Z8679 Personal history of other diseases of the circulatory system: Secondary | ICD-10-CM | POA: Diagnosis not present

## 2014-09-02 DIAGNOSIS — R0789 Other chest pain: Secondary | ICD-10-CM | POA: Insufficient documentation

## 2014-09-02 DIAGNOSIS — I5022 Chronic systolic (congestive) heart failure: Secondary | ICD-10-CM | POA: Diagnosis not present

## 2014-09-02 DIAGNOSIS — I10 Essential (primary) hypertension: Secondary | ICD-10-CM | POA: Insufficient documentation

## 2014-09-02 DIAGNOSIS — R079 Chest pain, unspecified: Secondary | ICD-10-CM

## 2014-09-02 DIAGNOSIS — J449 Chronic obstructive pulmonary disease, unspecified: Secondary | ICD-10-CM | POA: Diagnosis not present

## 2014-09-02 LAB — CBC
HCT: 38.9 % (ref 35.0–47.0)
Hemoglobin: 13 g/dL (ref 12.0–16.0)
MCH: 29.3 pg (ref 26.0–34.0)
MCHC: 33.3 g/dL (ref 32.0–36.0)
MCV: 87.8 fL (ref 80.0–100.0)
PLATELETS: 194 10*3/uL (ref 150–440)
RBC: 4.43 MIL/uL (ref 3.80–5.20)
RDW: 14.4 % (ref 11.5–14.5)
WBC: 7 10*3/uL (ref 3.6–11.0)

## 2014-09-02 LAB — BASIC METABOLIC PANEL
Anion gap: 7 (ref 5–15)
BUN: 15 mg/dL (ref 6–20)
CO2: 31 mmol/L (ref 22–32)
CREATININE: 0.71 mg/dL (ref 0.44–1.00)
Calcium: 9.3 mg/dL (ref 8.9–10.3)
Chloride: 103 mmol/L (ref 101–111)
GFR calc Af Amer: >60 mL/min (ref >60)
GFR calc non Af Amer: >60 mL/min (ref >60)
Glucose, Bld: 101 mg/dL — ABNORMAL HIGH (ref 65–99)
Potassium: 3.7 mmol/L (ref 3.5–5.1)
SODIUM: 141 mmol/L (ref 135–145)

## 2014-09-02 LAB — TROPONIN I: Troponin I: <0.03 ng/mL (ref <0.031)

## 2014-09-02 MED ORDER — ASPIRIN 81 MG PO CHEW
324.0000 mg | CHEWABLE_TABLET | Freq: Once | ORAL | Status: AC
  Administered 2014-09-02: 324 mg via ORAL
  Filled 2014-09-02: qty 4

## 2014-09-02 MED ORDER — MORPHINE SULFATE (PF) 2 MG/ML IV SOLN
2.0000 mg | Freq: Once | INTRAVENOUS | Status: AC
  Administered 2014-09-02: 2 mg via INTRAVENOUS
  Filled 2014-09-02: qty 1

## 2014-09-02 NOTE — Progress Notes (Signed)
Pt is feels better now. Ate some snack in the ER Second troponin 0.03 Vitals stable. She is agreeable to go home Prn tyelnol recommended F/u with dr Josefa Half as needed

## 2014-09-02 NOTE — ED Provider Notes (Signed)
[facility] Department Provider Note  ____________________________________________  Time seen: Approximately 1:35 PM  I have reviewed the triage vital signs and the nursing notes.   HISTORY  Chief Complaint Chest Pain    HPI Maria Neal is a 64 y.o. female history of coronary disease and presenting with left-sided chest pain which she described as a pressure that got worse with some shortness of breath while walking. Not associated with nausea or vomiting. Pain is located the left upper chest and radiates into the left arm with walking and exertion. No fevers or chills.  She is under the care of [facility] cardiology. She does not believe she said any recent "stress test"   Past Medical History  Diagnosis Date  . Hypertension   . CHF (congestive heart failure)   . Cardiomyopathy   . CAD (coronary artery disease)   . Anxiety   . COPD (chronic obstructive pulmonary disease)   . Insomnia     Patient Active Problem List   Diagnosis Date Noted  . CAP (community acquired pneumonia) 05/07/2014  . Benign essential HTN 05/07/2014  . CAD (coronary artery disease) 05/07/2014  . COPD (chronic obstructive pulmonary disease) 05/07/2014    Past Surgical History  Procedure Laterality Date  . Cardiac catheterization  04/2014    Dr. Idelle Leech  . Cardiac catheterization      Current Outpatient Rx  Name  Route  Sig  Dispense  Refill  . acetaminophen (TYLENOL) 500 MG tablet   Oral   Take 500-1,000 mg by mouth every 8 (eight) hours as needed for mild pain or headache.          . albuterol (PROVENTIL) (2.5 MG/3ML) 0.083% nebulizer solution   Inhalation   Inhale into the lungs.         Marland Kitchen aspirin EC 81 MG tablet   Oral   Take 81 mg by mouth daily.         . carvedilol (COREG) 6.25 MG tablet   Oral   Take 6.25 mg by mouth 2 (two) times daily.          Marland Kitchen Epigallocatechin Gallate (GREEN TEA) 95 % POWD   Does not apply   1 packet by  Does not apply route every other day.          . furosemide (LASIX) 20 MG tablet   Oral   Take 20 mg by mouth daily.         Marland Kitchen losartan (COZAAR) 50 MG tablet   Oral   Take 1 tablet by mouth daily.           Allergies Levaquin; Amoxicillin; Nitrofuran derivatives; Penicillins; and Zithromax  Family History  Problem Relation Age of Onset  . Diabetes Mellitus II Mother   . Heart attack Father   . Arthritis Sister   . Heart disease Brother     Social History Social History  Substance Use Topics  . Smoking status: Former Research scientist (life sciences)  . Smokeless tobacco: None  . Alcohol Use: No    Review of Systems Constitutional: No fever/chills Eyes: No visual changes. ENT: No sore throat. Cardiovascular: See history of present illness Respiratory: See history of present illness Gastrointestinal: No abdominal pain.  No nausea, no vomiting.  No diarrhea.  No constipation. Genitourinary: Negative for dysuria. Musculoskeletal: Negative for back pain. Skin: Negative for rash. Neurological: Negative for headaches, focal weakness or numbness.  10-point ROS otherwise negative.  ____________________________________________   PHYSICAL EXAM:  VITAL SIGNS: ED  Triage Vitals  Enc Vitals Group     BP 09/02/14 0930 130/66 mmHg     Pulse Rate 09/02/14 0930 75     Resp 09/02/14 0930 20     Temp 09/02/14 0930 98.2 F (36.8 C)     Temp Source 09/02/14 0930 Oral     SpO2 09/02/14 0930 90 %     Weight 09/02/14 0930 185 lb (83.915 kg)     Height 09/02/14 0930 5\' 4"  (1.626 m)     Head Cir --      Peak Flow --      Pain Score 09/02/14 0927 7     Pain Loc --      Pain Edu? --      Excl. in [place]? --     Constitutional: Alert and oriented. Well appearing and in no acute distress. Eyes: Conjunctivae are normal. PERRL. EOMI. Head: Atraumatic. Nose: No congestion/rhinnorhea. Mouth/Throat: Mucous membranes are moist.  Oropharynx non-erythematous. Neck: No stridor.   Cardiovascular: Normal  rate, regular rhythm. Grossly normal heart sounds.  Good peripheral circulation. Respiratory: Normal respiratory effort.  No retractions. Lungs CTAB. Gastrointestinal: Soft and nontender. No distention. No abdominal bruits. No CVA tenderness. Musculoskeletal: No lower extremity tenderness nor edema.  No joint effusions. Neurologic:  Normal speech and language. No gross focal neurologic deficits are appreciated. No gait instability. Skin:  Skin is warm, dry and intact. No rash noted. Psychiatric: Mood and affect are normal. Speech and behavior are normal.  ____________________________________________   LABS (all labs ordered are listed, but only abnormal results are displayed)  Labs Reviewed  BASIC METABOLIC PANEL - Abnormal; Notable for the following:    Glucose, Bld 101 (*)    All other components within normal limits  CBC  TROPONIN I  TROPONIN I   ____________________________________________  EKG  Reviewed and interpreted by me. Left bundle branch block. Normal sinus rhythm. QRS wide. Normal PR interval.  ____________________________________________  RADIOLOGY  DG Chest Port 1 View (Final result) Result time: 09/02/14 13:53:52   Final result by Rad Results In Interface (09/02/14 13:53:52)   Narrative:   CLINICAL DATA: 64 year old female with 24 hr of left side chest pain and shortness breath. Initial encounter.  EXAM: PORTABLE CHEST - 1 VIEW  COMPARISON: 05/18/2014 and earlier.  FINDINGS: Portable AP upright view at 1335 hrs. Stable cardiomegaly and mediastinal contours. Mildly increased pulmonary vascularity without overt edema. No pneumothorax, pleural effusion or consolidation.  IMPRESSION: Chronic cardiomegaly. Increased vascular congestion without overt edema or effusion.    ____________________________________________   PROCEDURES  Procedure(s) performed: None  Critical Care performed:  No  ____________________________________________   INITIAL IMPRESSION / ASSESSMENT AND PLAN / ED COURSE  Pertinent labs & imaging results that were available during my care of the patient were reviewed by me and considered in my medical decision making (see chart for details).  Patient presents with moderate left-sided chest pain worse with exertion. She reports a history of possible coronary disease. No significant risk factors or exam findings suggest other etiology such as dissection, pulmonary embolism, pneumothorax, or acute respiratory. Primary concern is rule out acute coronary syndrome given her symptomatology. EKG shows left bundle-branch block, but is difficult to discern acute ischemic change in this setting. First troponin is negative and reassuring. We'll treat with aspirin and morphine. Discussed with Dr. Clayborn Bigness, who did not have access to his charting system but does not believe the patient has had any recent provocative testing based on my discussion with the patient.  We'll admit the patient for ongoing treatment and pain control. Discussed with hospitalist Dr. Posey Pronto. ____________________________________________   FINAL CLINICAL IMPRESSION(S) / ED DIAGNOSES  Final diagnoses:  Chest pain with moderate risk for cardiac etiology      Delman Kitten, MD 09/02/14 [phone]

## 2014-09-02 NOTE — ED Notes (Signed)
Pt complains of left sided chest pain for the last 2 days with shortness of breath with exertion

## 2014-09-02 NOTE — Discharge Instructions (Signed)
Avoid lifting heavy weight

## 2014-09-02 NOTE — Consult Note (Signed)
[facility] [place] NAME: Maria Neal    MR#:  [id number]  DATE OF BIRTH:  1950/07/05  DATE OF ADMISSION:  09/02/2014  PRIMARY CARE PHYSICIAN: BLISS, Lynnell Jude, MD   REQUESTING/REFERRING PHYSICIAN: Dr Jacqualine Code  CHIEF COMPLAINT:   Chief Complaint  Patient presents with  . Chest Pain    HISTORY OF PRESENT ILLNESS:  Maria Neal  is a 64 y.o. 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 Patient states it is" pricking "in nature and does not radiate to arms or jaws. She denies any cough fever or shortness of breath. She takes a baby aspirin daily. In the emergency room. By evaluation patient is chest pain-free however she does get pain on palpation in the around the third fourth intercostal space. Patient appears him and out is stable. Denies any other complaints.  PAST MEDICAL HISTORY:   Past Medical History  Diagnosis Date  . Hypertension   . CHF (congestive heart failure)   . Cardiomyopathy   . CAD (coronary artery disease)   . Anxiety   . COPD (chronic obstructive pulmonary disease)   . Insomnia     PAST SURGICAL HISTOIRY:   Past Surgical History  Procedure Laterality Date  . Cardiac catheterization  04/2014    Dr. Idelle Leech  . Cardiac catheterization      SOCIAL HISTORY:   Social History  Substance Use Topics  . Smoking status: Former Research scientist (life sciences)  . Smokeless tobacco: Not on file  . Alcohol Use: No    FAMILY HISTORY:   Family History  Problem Relation Age of Onset  . Diabetes Mellitus II Mother   . Heart attack Father   . Arthritis Sister   . Heart disease Brother     DRUG ALLERGIES:   Allergies  Allergen Reactions  . Levaquin [Levofloxacin] Anaphylaxis  . Amoxicillin Hives  . Nitrofuran Derivatives Other (See  Comments)    Reaction: unknown  . Penicillins Other (See Comments)    Thinks it made her itch a lot, but isn't sure.  Marland Kitchen Zithromax [Azithromycin] Other (See Comments)    REVIEW OF SYSTEMS:   Review of Systems  Constitutional: Negative for fever, chills and diaphoresis.  HENT: Negative for congestion, ear pain, hearing loss, nosebleeds and sore throat.   Eyes: Negative for blurred vision, double vision, photophobia and pain.  Respiratory: Negative for hemoptysis, sputum production, wheezing and stridor.   Cardiovascular: Positive for chest pain. Negative for orthopnea, claudication and leg swelling.  Gastrointestinal: Negative for heartburn and abdominal pain.  Genitourinary: Negative for dysuria and frequency.  Musculoskeletal: Negative for back pain, joint pain and neck pain.  Skin: Negative for rash.  Neurological: Negative for tingling, sensory change, speech change, focal weakness, seizures, weakness and headaches.  Endo/Heme/Allergies: Does not bruise/bleed easily.  Psychiatric/Behavioral: Negative for memory loss. The patient is not nervous/anxious.   All other systems reviewed and are negative.   MEDICATIONS AT HOME:   Prior to Admission medications   Medication Sig Start Date End Date Taking? Authorizing Provider  acetaminophen (TYLENOL) 500 MG tablet Take 500-1,000 mg by mouth every 8 (eight) hours as needed for mild pain or headache.    Yes Historical Provider, MD  albuterol (PROVENTIL) (2.5 MG/3ML) 0.083% nebulizer solution Inhale into the lungs. 12/13/12  Yes Historical Provider,  MD  aspirin EC 81 MG tablet Take 81 mg by mouth daily.   Yes Historical Provider, MD  carvedilol (COREG) 6.25 MG tablet Take 6.25 mg by mouth 2 (two) times daily.    Yes Historical Provider, MD  Epigallocatechin Gallate (GREEN TEA) 95 % POWD 1 packet by Does not apply route every other day.    Yes Historical Provider, MD  furosemide (LASIX) 20 MG tablet Take 20 mg by mouth daily.   Yes Historical  Provider, MD  losartan (COZAAR) 50 MG tablet Take 1 tablet by mouth daily. 08/24/14  Yes Historical Provider, MD      VITAL SIGNS:  Blood pressure 149/89, pulse 74, temperature 98.3 F (36.8 C), temperature source Oral, resp. rate 16, height 5\' 4"  (1.626 m), weight 83.915 kg (185 lb), SpO2 96 %.  PHYSICAL EXAMINATION:  GENERAL:  64 y.o.-year-old patient lying in the bed with no acute distress.  EYES: Pupils equal, round, reactive to light and accommodation. No scleral icterus. Extraocular muscles intact.  HEENT: Head atraumatic, normocephalic. Oropharynx and nasopharynx clear.  NECK:  Supple, no jugular venous distention. No thyroid enlargement, no tenderness.  LUNGS: Normal breath sounds bilaterally, no wheezing, rales,rhonchi or crepitation. No use of accessory muscles of respiration.  CARDIOVASCULAR: S1, S2 normal. No murmurs, rubs, or gallops. Some tenderness in the left 3-4th intercostal space on plapation ABDOMEN: Soft, nontender, nondistended. Bowel sounds present. No organomegaly or mass.  EXTREMITIES: No pedal edema, cyanosis, or clubbing.  NEUROLOGIC: Cranial nerves II through XII are intact. Muscle strength 5/5 in all extremities. Sensation intact. Gait not checked.  PSYCHIATRIC: The patient is alert and oriented x 3.  SKIN: No obvious rash, lesion, or ulcer.   LABORATORY PANEL:   CBC  Recent Labs Lab 09/02/14 0934  WBC 7.0  HGB 13.0  HCT 38.9  PLT 194   ------------------------------------------------------------------------------------------------------------------  Chemistries   Recent Labs Lab 09/02/14 0934  NA 141  K 3.7  CL 103  CO2 31  GLUCOSE 101*  BUN 15  CREATININE 0.71  CALCIUM 9.3   ------------------------------------------------------------------------------------------------------------------  Cardiac Enzymes  Recent Labs Lab 09/02/14 0934  TROPONINI <0.03    ------------------------------------------------------------------------------------------------------------------  RADIOLOGY:  Dg Chest Port 1 View  09/02/2014   CLINICAL DATA:  64 year old female with 24 hr of left side chest pain and shortness breath. Initial encounter.  EXAM: PORTABLE CHEST - 1 VIEW  COMPARISON:  05/18/2014 and earlier.  FINDINGS: Portable AP upright view at 1335 hrs. Stable cardiomegaly and mediastinal contours. Mildly increased pulmonary vascularity without overt edema. No pneumothorax, pleural effusion or consolidation.  IMPRESSION: Chronic cardiomegaly. Increased vascular congestion without overt edema or effusion.   Electronically Signed   By: Genevie Ann M.D.   On: 09/02/2014 13:53    EKG:   Orders placed or performed during the hospital encounter of 09/02/14  . ED EKG within 10 minutes  . 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  1. Chest pain Patient's pain is reproducible on palpation appears more likely musculoskeletal/costochondritis. Advised her to take when necessary Tylenol for pain. Her first set of cardiac enzyme is negative. Her EKG does not show any acute ST-T changes. She has known history of left bundle branch block.  We will repeat one more troponin. Patient appears him and I placed stable. She is hungry once to eat. Spoke with Dr. Clayborn Bigness for is agreeable with repeating troponin and if within normal limits and if patient feeling better is okay to discharge her to home and follow up with her cardiologist at [facility] Dr. Mylinda Latina.  2. Hypertension Continue Coreg and losartan.  3 chronic systolic congestive heart failure patient appears euvolemic sats are 96% on room  air. Chest x-ray shows chronic cardiomegaly. Continue Lasix.  4. 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 Patient is scheduled to get ICD placement at [facility] in next few weeks.  Above was discussed with patient's daughter who was present emergency room. Case was discussed with Dr. Clayborn Bigness who agrees with above plan.  All the records are reviewed and case discussed with Consulting provider. Management plans discussed with the patient, family and they are in agreement.  CODE STATUS:full TOTAL TIME TAKING CARE OF THIS PATIENT: 40 minutes.    Simrit Gohlke M.D on 09/02/2014 at 2:10 PM  Between 7am to 6pm - Pager - [phone]  After 6pm go to [email or web address] - password [email or web address] [place] [facility]  Office  [phone]  CC: Primary care Physician: Lynnell Jude, MD

## 2014-10-02 DIAGNOSIS — I447 Left bundle-branch block, unspecified: Secondary | ICD-10-CM | POA: Insufficient documentation

## 2014-11-12 ENCOUNTER — Ambulatory Visit
Admission: EM | Admit: 2014-11-12 | Discharge: 2014-11-12 | Disposition: A | Discharge status: Home / Self Care | Payer: Medicare Other | Attending: Family Medicine | Admitting: Family Medicine

## 2014-11-12 ENCOUNTER — Encounter: Payer: Self-pay | Admitting: Emergency Medicine

## 2014-11-12 DIAGNOSIS — J029 Acute pharyngitis, unspecified: Secondary | ICD-10-CM | POA: Insufficient documentation

## 2014-11-12 LAB — RAPID STREP SCREEN (MED CTR MEBANE ONLY): STREPTOCOCCUS, GROUP A SCREEN (DIRECT): NEGATIVE

## 2014-11-12 MED ORDER — LORATADINE 10 MG PO TABS
10.0000 mg | ORAL_TABLET | Freq: Every day | ORAL | Status: DC
Start: 2014-11-12 — End: 2017-03-08

## 2014-11-12 MED ORDER — PREDNISONE 20 MG PO TABS: 20.0000 mg | ORAL_TABLET | Freq: Every day | ORAL | Status: AC

## 2014-11-12 NOTE — Discharge Instructions (Signed)
Take medication as prescribed. Rest. Drink and eat regularly. Gargle warm salt water as needed.   Follow up closely with your primary care physician this week as needed.   Return to Urgent care or go to ER for increased sore throat, difficulty eating or drinking, fever, weakness, new or worsening concerns.   Pharyngitis Pharyngitis is redness, pain, and swelling (inflammation) of your pharynx.  CAUSES  Pharyngitis is usually caused by infection. Most of the time, these infections are from viruses (viral) and are part of a cold. However, sometimes pharyngitis is caused by bacteria (bacterial). Pharyngitis can also be caused by allergies. Viral pharyngitis may be spread from person to person by coughing, sneezing, and personal items or utensils (cups, forks, spoons, toothbrushes). Bacterial pharyngitis may be spread from person to person by more intimate contact, such as kissing.  SIGNS AND SYMPTOMS  Symptoms of pharyngitis include:   Sore throat.   Tiredness (fatigue).   Low-grade fever.   Headache.  Joint pain and muscle aches.  Skin rashes.  Swollen lymph nodes.  Plaque-like film on throat or tonsils (often seen with bacterial pharyngitis). DIAGNOSIS  Your health care provider will ask you questions about your illness and your symptoms. Your medical history, along with a physical exam, is often all that is needed to diagnose pharyngitis. Sometimes, a rapid strep test is done. Other lab tests may also be done, depending on the suspected cause.  TREATMENT  Viral pharyngitis will usually get better in 3-4 days without the use of medicine. Bacterial pharyngitis is treated with medicines that kill germs (antibiotics).  HOME CARE INSTRUCTIONS   Drink enough water and fluids to keep your urine clear or pale yellow.   Only take over-the-counter or prescription medicines as directed by your health care provider:   If you are prescribed antibiotics, make sure you finish them even  if you start to feel better.   Do not take aspirin.   Get lots of rest.   Gargle with 8 oz of salt water ( tsp of salt per 1 qt of water) as often as every 1-2 hours to soothe your throat.   Throat lozenges (if you are not at risk for choking) or sprays may be used to soothe your throat. SEEK MEDICAL CARE IF:   You have large, tender lumps in your neck.  You have a rash.  You cough up green, yellow-brown, or bloody spit. SEEK IMMEDIATE MEDICAL CARE IF:   Your neck becomes stiff.  You drool or are unable to swallow liquids.  You vomit or are unable to keep medicines or liquids down.  You have severe pain that does not go away with the use of recommended medicines.  You have trouble breathing (not caused by a stuffy nose). MAKE SURE YOU:   Understand these instructions.  Will watch your condition.  Will get help right away if you are not doing well or get worse.   This information is not intended to replace advice given to you by your health care provider. Make sure you discuss any questions you have with your health care provider.   Document Released: 12/19/2004 Document Revised: 10/09/2012 Document Reviewed: 08/26/2012 Elsevier Interactive Patient Education [employer].

## 2014-11-12 NOTE — ED Notes (Signed)
Patient c/o sore throat for 2 days.

## 2014-11-12 NOTE — ED Provider Notes (Signed)
[facility]  ____________________________________________  Time seen: Approximately 10:09 AM  I have reviewed the triage vital signs and the nursing notes.   HISTORY  Chief Complaint Sore Throat   HPI Maria Neal is a 64 y.o. female presents for complaints of 2 days of sore throat. States current sore throat is pain and scratchy at 4/10. Reports continues to eat and drink well without difficulties, but sore throat does cause pain with swallowing food and fluids. Denies other changes or triggers of sore throat. Denies fevers. Denies chest pain, shortness of breath, abdominal pain, fever, neck pain, rash. Denies sinus pressure or drainage. Denies headache, dizziness, weakness, nausea, vomiting, or diarrhea.   Reports some runny nose, cough and sneezing. States sore throat improves with drinking cold fluids. Does report that her daughter last week had upper respiratory infection. Denies other known sick contacts.  States she has had similar in past at this time of year and was given prednisone with "good and quick" relief. States has used over the counter tylenol without any changes.    PCP: Clemmie Krill Cardiology: [facility]   Past Medical History  Diagnosis Date  . Hypertension   . CHF (congestive heart failure) ([place])   . Cardiomyopathy ([place])   . CAD (coronary artery disease)   . Anxiety   . COPD (chronic obstructive pulmonary disease) ([place])   . Insomnia     Patient Active Problem List   Diagnosis Date Noted  . CAP (community acquired pneumonia) 05/07/2014  . Benign essential HTN 05/07/2014  . CAD (coronary artery disease) 05/07/2014  . COPD (chronic obstructive pulmonary disease) ([place]) 05/07/2014    Past Surgical History  Procedure Laterality Date  . Cardiac catheterization  04/2014    Dr. Idelle Leech  . Cardiac catheterization      Current Outpatient Rx  Name  Route  Sig  Dispense  Refill  . acetaminophen (TYLENOL) 500 MG tablet   Oral   Take 500-1,000 mg by  mouth every 8 (eight) hours as needed for mild pain or headache.          . albuterol (PROVENTIL) (2.5 MG/3ML) 0.083% nebulizer solution   Inhalation   Inhale into the lungs.         Marland Kitchen aspirin EC 81 MG tablet   Oral   Take 81 mg by mouth daily.         . carvedilol (COREG) 6.25 MG tablet   Oral   Take 6.25 mg by mouth 2 (two) times daily.          Marland Kitchen Epigallocatechin Gallate (GREEN TEA) 95 % POWD   Does not apply   1 packet by Does not apply route every other day.          . furosemide (LASIX) 20 MG tablet   Oral   Take 20 mg by mouth daily.         Marland Kitchen losartan (COZAAR) 50 MG tablet   Oral   Take 1 tablet by mouth daily.           Allergies Levaquin; Amoxicillin; Nitrofuran derivatives; Penicillins; and Zithromax  Family History  Problem Relation Age of Onset  . Diabetes Mellitus II Mother   . Heart attack Father   . Arthritis Sister   . Heart disease Brother     Social History Social History  Substance Use Topics  . Smoking status: Former Research scientist (life sciences)  . Smokeless tobacco: None  . Alcohol Use: No    Review of Systems Constitutional: No  fever/chills Eyes: No visual changes. ENT: positive sore throat. Cardiovascular: Denies chest pain. Respiratory: Denies shortness of breath. Gastrointestinal: No abdominal pain.  No nausea, no vomiting.  No diarrhea.  No constipation. Genitourinary: Negative for dysuria. Musculoskeletal: Negative for back pain. Skin: Negative for rash. Neurological: Negative for headaches, focal weakness or numbness.  10-point ROS otherwise negative.  ____________________________________________   PHYSICAL EXAM:  VITAL SIGNS: ED Triage Vitals  Enc Vitals Group     BP 11/12/14 0920 137/61 mmHg     Pulse Rate 11/12/14 0920 74     Resp 11/12/14 0920 16     Temp 11/12/14 0920 97.3 F (36.3 C)     Temp Source 11/12/14 0920 Tympanic     SpO2 11/12/14 0920 98 %     Weight 11/12/14 0920 192 lb (87.091 kg)     Height 11/12/14  0920 5\' 4"  (1.626 m)     Head Cir --      Peak Flow --      Pain Score 11/12/14 0924 6     Pain Loc --      Pain Edu? --      Excl. in [place]? --     Constitutional: Alert and oriented. Well appearing and in no acute distress. Eyes: Conjunctivae are normal. PERRL. EOMI. Head: Atraumatic. No facial or sinus tenderness to palpation.  Ears: no erythema, normal TMs bilaterally.   Nose: No congestion/rhinnorhea. Nares patent.  Mouth/Throat: Mucous membranes are moist.  Mild pharyngeal erythema. No tonsillar swelling or exudate. No uvular deviation or shift.  Neck: No stridor.  No cervical spine tenderness to palpation. No carotid bruits.  Hematological/Lymphatic/Immunilogical: No cervical lymphadenopathy. Cardiovascular: Normal rate, regular rhythm. Grossly normal heart sounds.  Good peripheral circulation. Respiratory: Normal respiratory effort.  No retractions. Lungs CTAB. No wheezes, rales or rhonchi. Good air movement. No cough in room. Gastrointestinal: Soft and nontender. No distention. Normal Bowel sounds.  No abdominal bruits. No CVA tenderness. Musculoskeletal: No lower or upper extremity tenderness nor edema.  Bilateral pedal pulses equal and easily palpated. No cervical, thoracic or lumbar tenderness to palpation. Neurologic:  Normal speech and language. No gross focal neurologic deficits are appreciated. No gait instability. Skin:  Skin is warm, dry and intact. No rash noted. Psychiatric: Mood and affect are normal. Speech and behavior are normal.  ____________________________________________   LABS (all labs ordered are listed, but only abnormal results are displayed)  Labs Reviewed  RAPID STREP SCREEN (NOT AT [facility])  CULTURE, GROUP A STREP ([place])     INITIAL IMPRESSION / ASSESSMENT AND PLAN / ED COURSE  Pertinent labs & imaging results that were available during my care of the patient were reviewed by me and considered in my medical decision making (see chart for  details).  Discussed patient and plan of care with Dr Alveta Heimlich, who also reviewed patient and agreed with plan of care and medication choices.   Very well-appearing patient. No acute distress. Presents for the complaints of sore throat 2 days. Patient is also reports that she has some occasional sneezing, runny nose and cough. Denies fever or feeling warm or chills. Moist mucous membranes. Reports continues to eat and drink well. Lungs clear throughout. Very well-appearing. Quick strep negative. Will culture strep swab. Discussed with patient suspect viral pharyngitis or related to seasonal allergy due to intermittent sneezing, runny nose and cough. Discussed with patient we will treat with oral Claritin as well as short course of low-dose prednisone for 3 days. Encouraged to eat  and drink well. When necessary Tylenol as needed. Warm salt water gargles and over-the-counter lozenges as needed. Discussed with patient to follow-up with her primary care physician closely. Discussed with patient to return immediately to urgent care or proceed to the ER for fever, increased sore throat, difficulty eating or swallowing, weakness, new or worsening concerns.  Discussed follow up with Primary care physician this week. Discussed follow up and return parameters including no resolution or any worsening concerns. Patient verbalized understanding and agreed to plan.   ____________________________________________   FINAL CLINICAL IMPRESSION(S) / ED DIAGNOSES  Final diagnoses:  Pharyngitis       Marylene Land, NP 11/12/14 1037

## 2014-11-14 LAB — CULTURE, GROUP A STREP (THRC)

## 2014-11-16 NOTE — ED Notes (Signed)
Final report of strep negative  

## 2014-12-02 DIAGNOSIS — Z95810 Presence of automatic (implantable) cardiac defibrillator: Secondary | ICD-10-CM | POA: Insufficient documentation

## 2015-01-18 DIAGNOSIS — Z1212 Encounter for screening for malignant neoplasm of rectum: Secondary | ICD-10-CM | POA: Diagnosis not present

## 2015-01-18 DIAGNOSIS — Z1211 Encounter for screening for malignant neoplasm of colon: Secondary | ICD-10-CM | POA: Diagnosis not present

## 2015-02-05 DIAGNOSIS — I10 Essential (primary) hypertension: Secondary | ICD-10-CM | POA: Diagnosis not present

## 2015-02-05 DIAGNOSIS — J410 Simple chronic bronchitis: Secondary | ICD-10-CM | POA: Diagnosis not present

## 2015-02-05 DIAGNOSIS — R0602 Shortness of breath: Secondary | ICD-10-CM | POA: Diagnosis not present

## 2015-02-05 DIAGNOSIS — Z9889 Other specified postprocedural states: Secondary | ICD-10-CM | POA: Diagnosis not present

## 2015-02-05 DIAGNOSIS — Z95810 Presence of automatic (implantable) cardiac defibrillator: Secondary | ICD-10-CM | POA: Diagnosis not present

## 2015-02-05 DIAGNOSIS — J441 Chronic obstructive pulmonary disease with (acute) exacerbation: Secondary | ICD-10-CM | POA: Diagnosis not present

## 2015-02-05 DIAGNOSIS — I5022 Chronic systolic (congestive) heart failure: Secondary | ICD-10-CM | POA: Diagnosis not present

## 2015-02-05 DIAGNOSIS — I5023 Acute on chronic systolic (congestive) heart failure: Secondary | ICD-10-CM | POA: Diagnosis not present

## 2015-02-05 DIAGNOSIS — I428 Other cardiomyopathies: Secondary | ICD-10-CM | POA: Diagnosis not present

## 2015-03-16 DIAGNOSIS — I447 Left bundle-branch block, unspecified: Secondary | ICD-10-CM | POA: Diagnosis not present

## 2015-06-10 DIAGNOSIS — I5032 Chronic diastolic (congestive) heart failure: Secondary | ICD-10-CM | POA: Diagnosis not present

## 2015-06-10 DIAGNOSIS — Z95810 Presence of automatic (implantable) cardiac defibrillator: Secondary | ICD-10-CM | POA: Diagnosis not present

## 2015-06-10 DIAGNOSIS — Z9889 Other specified postprocedural states: Secondary | ICD-10-CM | POA: Diagnosis not present

## 2015-06-10 DIAGNOSIS — I5023 Acute on chronic systolic (congestive) heart failure: Secondary | ICD-10-CM | POA: Diagnosis not present

## 2015-06-10 DIAGNOSIS — J410 Simple chronic bronchitis: Secondary | ICD-10-CM | POA: Diagnosis not present

## 2015-06-10 DIAGNOSIS — R0602 Shortness of breath: Secondary | ICD-10-CM | POA: Diagnosis not present

## 2015-06-10 DIAGNOSIS — I447 Left bundle-branch block, unspecified: Secondary | ICD-10-CM | POA: Diagnosis not present

## 2015-06-10 DIAGNOSIS — I10 Essential (primary) hypertension: Secondary | ICD-10-CM | POA: Diagnosis not present

## 2015-06-10 DIAGNOSIS — I428 Other cardiomyopathies: Secondary | ICD-10-CM | POA: Diagnosis not present

## 2015-09-21 DIAGNOSIS — I5023 Acute on chronic systolic (congestive) heart failure: Secondary | ICD-10-CM | POA: Diagnosis not present

## 2015-09-21 DIAGNOSIS — R0602 Shortness of breath: Secondary | ICD-10-CM | POA: Diagnosis not present

## 2015-09-21 DIAGNOSIS — I5032 Chronic diastolic (congestive) heart failure: Secondary | ICD-10-CM | POA: Diagnosis not present

## 2015-10-13 DIAGNOSIS — M545 Low back pain: Secondary | ICD-10-CM | POA: Diagnosis not present

## 2015-10-13 DIAGNOSIS — Z6834 Body mass index (BMI) 34.0-34.9, adult: Secondary | ICD-10-CM | POA: Diagnosis not present

## 2015-10-13 DIAGNOSIS — Z23 Encounter for immunization: Secondary | ICD-10-CM | POA: Diagnosis not present

## 2015-10-21 DIAGNOSIS — I5023 Acute on chronic systolic (congestive) heart failure: Secondary | ICD-10-CM | POA: Diagnosis not present

## 2015-10-21 DIAGNOSIS — Z95810 Presence of automatic (implantable) cardiac defibrillator: Secondary | ICD-10-CM | POA: Diagnosis not present

## 2015-10-21 DIAGNOSIS — Z9889 Other specified postprocedural states: Secondary | ICD-10-CM | POA: Diagnosis not present

## 2015-10-21 DIAGNOSIS — I428 Other cardiomyopathies: Secondary | ICD-10-CM | POA: Diagnosis not present

## 2015-10-21 DIAGNOSIS — I447 Left bundle-branch block, unspecified: Secondary | ICD-10-CM | POA: Diagnosis not present

## 2015-10-21 DIAGNOSIS — J410 Simple chronic bronchitis: Secondary | ICD-10-CM | POA: Diagnosis not present

## 2015-10-21 DIAGNOSIS — I5022 Chronic systolic (congestive) heart failure: Secondary | ICD-10-CM | POA: Diagnosis not present

## 2015-10-21 DIAGNOSIS — R0602 Shortness of breath: Secondary | ICD-10-CM | POA: Diagnosis not present

## 2015-11-08 DIAGNOSIS — R0602 Shortness of breath: Secondary | ICD-10-CM | POA: Diagnosis not present

## 2015-11-24 DIAGNOSIS — I447 Left bundle-branch block, unspecified: Secondary | ICD-10-CM | POA: Diagnosis not present

## 2015-11-24 DIAGNOSIS — Z95810 Presence of automatic (implantable) cardiac defibrillator: Secondary | ICD-10-CM | POA: Diagnosis not present

## 2015-11-24 DIAGNOSIS — J410 Simple chronic bronchitis: Secondary | ICD-10-CM | POA: Diagnosis not present

## 2015-11-24 DIAGNOSIS — Z9889 Other specified postprocedural states: Secondary | ICD-10-CM | POA: Diagnosis not present

## 2015-11-24 DIAGNOSIS — I10 Essential (primary) hypertension: Secondary | ICD-10-CM | POA: Diagnosis not present

## 2015-11-24 DIAGNOSIS — R0602 Shortness of breath: Secondary | ICD-10-CM | POA: Diagnosis not present

## 2015-11-24 DIAGNOSIS — I5022 Chronic systolic (congestive) heart failure: Secondary | ICD-10-CM | POA: Diagnosis not present

## 2015-11-24 DIAGNOSIS — I428 Other cardiomyopathies: Secondary | ICD-10-CM | POA: Diagnosis not present

## 2015-12-13 DIAGNOSIS — Z6835 Body mass index (BMI) 35.0-35.9, adult: Secondary | ICD-10-CM | POA: Diagnosis not present

## 2015-12-13 DIAGNOSIS — J020 Streptococcal pharyngitis: Secondary | ICD-10-CM | POA: Diagnosis not present

## 2016-01-17 ENCOUNTER — Ambulatory Visit
Admission: EM | Admit: 2016-01-17 | Discharge: 2016-01-17 | Disposition: A | Discharge status: Home / Self Care | Payer: Medicare Other | Attending: Family Medicine | Admitting: Family Medicine

## 2016-01-17 DIAGNOSIS — J441 Chronic obstructive pulmonary disease with (acute) exacerbation: Secondary | ICD-10-CM | POA: Diagnosis not present

## 2016-01-17 MED ORDER — DOXYCYCLINE HYCLATE 100 MG PO TABS: 100.0000 mg | ORAL_TABLET | Freq: Two times a day (BID) | ORAL | 0 refills | Status: DC

## 2016-01-17 MED ORDER — PREDNISONE 20 MG PO TABS: ORAL_TABLET | ORAL | 0 refills | Status: DC

## 2016-01-17 MED ORDER — ALBUTEROL SULFATE HFA 108 (90 BASE) MCG/ACT IN AERS
INHALATION_SPRAY | RESPIRATORY_TRACT | 0 refills | Status: DC
Start: 2016-01-17 — End: 2017-03-08

## 2016-01-17 NOTE — ED Provider Notes (Signed)
[facility]    CSN: [id number] Arrival date & time: 01/17/16  1022     History   Chief Complaint Chief Complaint  Patient presents with  . Cough    HPI Maria Neal is a 66 y.o. female.   The history is provided by the patient.  URI  Presenting symptoms: congestion and cough   Severity:  Moderate Onset quality:  Sudden Duration:  7 days Timing:  Constant Progression:  Worsening Chronicity:  New Relieved by:  None tried Ineffective treatments:  None tried Risk factors: being elderly, chronic cardiac disease and chronic respiratory disease (copd)   Risk factors: no chronic kidney disease, no diabetes mellitus, no immunosuppression, no recent illness and no recent travel     Past Medical History:  Diagnosis Date  . Anxiety   . CAD (coronary artery disease)   . Cardiomyopathy ([place])   . CHF (congestive heart failure) ([place])   . COPD (chronic obstructive pulmonary disease) ([place])   . Hypertension   . Insomnia     Patient Active Problem List   Diagnosis Date Noted  . CAP (community acquired pneumonia) 05/07/2014  . Benign essential HTN 05/07/2014  . CAD (coronary artery disease) 05/07/2014  . COPD (chronic obstructive pulmonary disease) ([place]) 05/07/2014    Past Surgical History:  Procedure Laterality Date  . CARDIAC CATHETERIZATION  04/2014   Dr. Idelle Leech  . CARDIAC CATHETERIZATION      OB History    No data available       Home Medications    Prior to Admission medications   Medication Sig Start Date End Date Taking? Authorizing Provider  acetaminophen (TYLENOL) 500 MG tablet Take 500-1,000 mg by mouth every 8 (eight) hours as needed for mild pain or headache.    Yes Historical Provider, MD  aspirin EC 81 MG tablet Take 81 mg by mouth daily.   Yes Historical Provider, MD  carvedilol (COREG) 6.25 MG tablet Take 6.25 mg by mouth 2 (two) times daily.    Yes Historical Provider, MD  furosemide (LASIX) 20 MG tablet Take 20 mg by mouth daily.    Yes Historical Provider, MD  losartan (COZAAR) 50 MG tablet Take 1 tablet by mouth daily. 08/24/14  Yes Historical Provider, MD  spironolactone (ALDACTONE) 25 MG tablet Take 25 mg by mouth daily.   Yes Historical Provider, MD  albuterol (PROVENTIL HFA;VENTOLIN HFA) 108 (90 Base) MCG/ACT inhaler 2 puffs q 6 hours 01/17/16   Norval Gable, MD  doxycycline (VIBRA-TABS) 100 MG tablet Take 1 tablet (100 mg total) by mouth 2 (two) times daily. 01/17/16   Norval Gable, MD  Epigallocatechin Gallate (GREEN TEA) 95 % POWD 1 packet by Does not apply route every other day.     Historical Provider, MD  loratadine (CLARITIN) 10 MG tablet Take 1 tablet (10 mg total) by mouth daily. 11/12/14   Marylene Land, NP  predniSONE (DELTASONE) 20 MG tablet 3 tabs po qd for 2 days, then 2 tabs po qd for 3 days, then 1 tab po qd for 3 days, then half a tab po qd for 2 days 01/17/16   Norval Gable, MD    Family History Family History  Problem Relation Age of Onset  . Diabetes Mellitus II Mother   . Heart attack Father   . Heart disease Brother   . Arthritis Sister     Social History Social History  Substance Use Topics  . Smoking status: Former Research scientist (life sciences)  . Smokeless tobacco: Never Used  .  Alcohol use No     Allergies   Levaquin [levofloxacin]; Amoxicillin; Nitrofuran derivatives; Penicillins; and Zithromax [azithromycin]   Review of Systems Review of Systems  HENT: Positive for congestion.   Respiratory: Positive for cough.      Physical Exam Triage Vital Signs ED Triage Vitals  Enc Vitals Group     BP 01/17/16 1037 (!) 127/94     Pulse Rate 01/17/16 1037 73     Resp 01/17/16 1037 16     Temp 01/17/16 1037 98 F (36.7 C)     Temp Source 01/17/16 1037 Oral     SpO2 01/17/16 1037 92 %     Weight 01/17/16 1033 200 lb (90.7 kg)     Height 01/17/16 1033 5\' 4"  (1.626 m)     Head Circumference --      Peak Flow --      Pain Score 01/17/16 1037 4     Pain Loc --      Pain Edu? --      Excl. in [place]?  --    No data found.   Updated Vital Signs BP (!) 127/94 (BP Location: Left Arm)   Pulse 73   Temp 98 F (36.7 C) (Oral)   Resp 16   Ht 5\' 4"  (1.626 m)   Wt 200 lb (90.7 kg)   SpO2 92%   BMI 34.33 kg/m   Visual Acuity Right Eye Distance:   Left Eye Distance:   Bilateral Distance:    Right Eye Near:   Left Eye Near:    Bilateral Near:     Physical Exam  Constitutional: She appears well-developed and well-nourished. No distress.  HENT:  Head: Normocephalic and atraumatic.  Right Ear: Tympanic membrane, external ear and ear canal normal.  Left Ear: Tympanic membrane, external ear and ear canal normal.  Nose: No mucosal edema, rhinorrhea, nose lacerations, sinus tenderness, nasal deformity, septal deviation or nasal septal hematoma. No epistaxis.  No foreign bodies. Right sinus exhibits no maxillary sinus tenderness and no frontal sinus tenderness. Left sinus exhibits no maxillary sinus tenderness and no frontal sinus tenderness.  Mouth/Throat: Uvula is midline, oropharynx is clear and moist and mucous membranes are normal. No oropharyngeal exudate.  Eyes: Conjunctivae and EOM are normal. Pupils are equal, round, and reactive to light. Right eye exhibits no discharge. Left eye exhibits no discharge. No scleral icterus.  Neck: Normal range of motion. Neck supple. No thyromegaly present.  Cardiovascular: Normal rate, regular rhythm and normal heart sounds.   Pulmonary/Chest: Effort normal. No respiratory distress. She has wheezes (and diffuse rhonchi). She has no rales.  Lymphadenopathy:    She has no cervical adenopathy.  Skin: She is not diaphoretic.  Nursing note and vitals reviewed.    UC Treatments / Results  Labs (all labs ordered are listed, but only abnormal results are displayed) Labs Reviewed - No data to display  EKG  EKG Interpretation None       Radiology No results found.  Procedures Procedures (including critical care time)  Medications Ordered  in UC Medications - No data to display   Initial Impression / Assessment and Plan / UC Course  I have reviewed the triage vital signs and the nursing notes.  Pertinent labs & imaging results that were available during my care of the patient were reviewed by me and considered in my medical decision making (see chart for details).  Clinical Course       Final Clinical Impressions(s) / UC Diagnoses  Final diagnoses:  COPD exacerbation ([place])    New Prescriptions Discharge Medication List as of 01/17/2016 11:45 AM    START taking these medications   Details  albuterol (PROVENTIL HFA;VENTOLIN HFA) 108 (90 Base) MCG/ACT inhaler 2 puffs q 6 hours, Normal    doxycycline (VIBRA-TABS) 100 MG tablet Take 1 tablet (100 mg total) by mouth 2 (two) times daily., Starting Mon 01/17/2016, Normal    predniSONE (DELTASONE) 20 MG tablet 3 tabs po qd for 2 days, then 2 tabs po qd for 3 days, then 1 tab po qd for 3 days, then half a tab po qd for 2 days, Normal       1. diagnosis reviewed with patient 2. rx as per orders above; reviewed possible side effects, interactions, risks and benefits  3. Continue home O2 4. Follow-up prn if symptoms worsen or don't improve   Norval Gable, MD 01/17/16 1237

## 2016-01-17 NOTE — ED Triage Notes (Signed)
Patient complains of cough and sore throat. Patient states that she was positive two weeks for Strep. Patient states that was treated with antibiotics and patient states that she has still not improved.

## 2016-02-04 DIAGNOSIS — R05 Cough: Secondary | ICD-10-CM | POA: Diagnosis not present

## 2016-02-04 DIAGNOSIS — Z6835 Body mass index (BMI) 35.0-35.9, adult: Secondary | ICD-10-CM | POA: Diagnosis not present

## 2016-02-10 DIAGNOSIS — I447 Left bundle-branch block, unspecified: Secondary | ICD-10-CM | POA: Diagnosis not present

## 2016-03-21 DIAGNOSIS — Z9889 Other specified postprocedural states: Secondary | ICD-10-CM | POA: Diagnosis not present

## 2016-03-21 DIAGNOSIS — I5022 Chronic systolic (congestive) heart failure: Secondary | ICD-10-CM | POA: Diagnosis not present

## 2016-03-21 DIAGNOSIS — I428 Other cardiomyopathies: Secondary | ICD-10-CM | POA: Diagnosis not present

## 2016-03-21 DIAGNOSIS — J410 Simple chronic bronchitis: Secondary | ICD-10-CM | POA: Diagnosis not present

## 2016-03-21 DIAGNOSIS — I5023 Acute on chronic systolic (congestive) heart failure: Secondary | ICD-10-CM | POA: Diagnosis not present

## 2016-03-21 DIAGNOSIS — I10 Essential (primary) hypertension: Secondary | ICD-10-CM | POA: Diagnosis not present

## 2016-03-21 DIAGNOSIS — Z95810 Presence of automatic (implantable) cardiac defibrillator: Secondary | ICD-10-CM | POA: Diagnosis not present

## 2016-03-21 DIAGNOSIS — R0602 Shortness of breath: Secondary | ICD-10-CM | POA: Diagnosis not present

## 2016-05-02 DIAGNOSIS — I447 Left bundle-branch block, unspecified: Secondary | ICD-10-CM | POA: Diagnosis not present

## 2016-05-06 IMAGING — CR DG CHEST 2V
1 series · 2 of 2 positions shown · non-contrast
Comparison: 12/07/2013

CLINICAL DATA: Shortness of breath for 3 weeks. Rheumatic fever
when she was younger.

EXAM:
CHEST  2 VIEW

[Series 1: dxr chest pa (or ap) and lateral · 0.14mm/px · 2 of 2 slices shown]
[im 1/2]
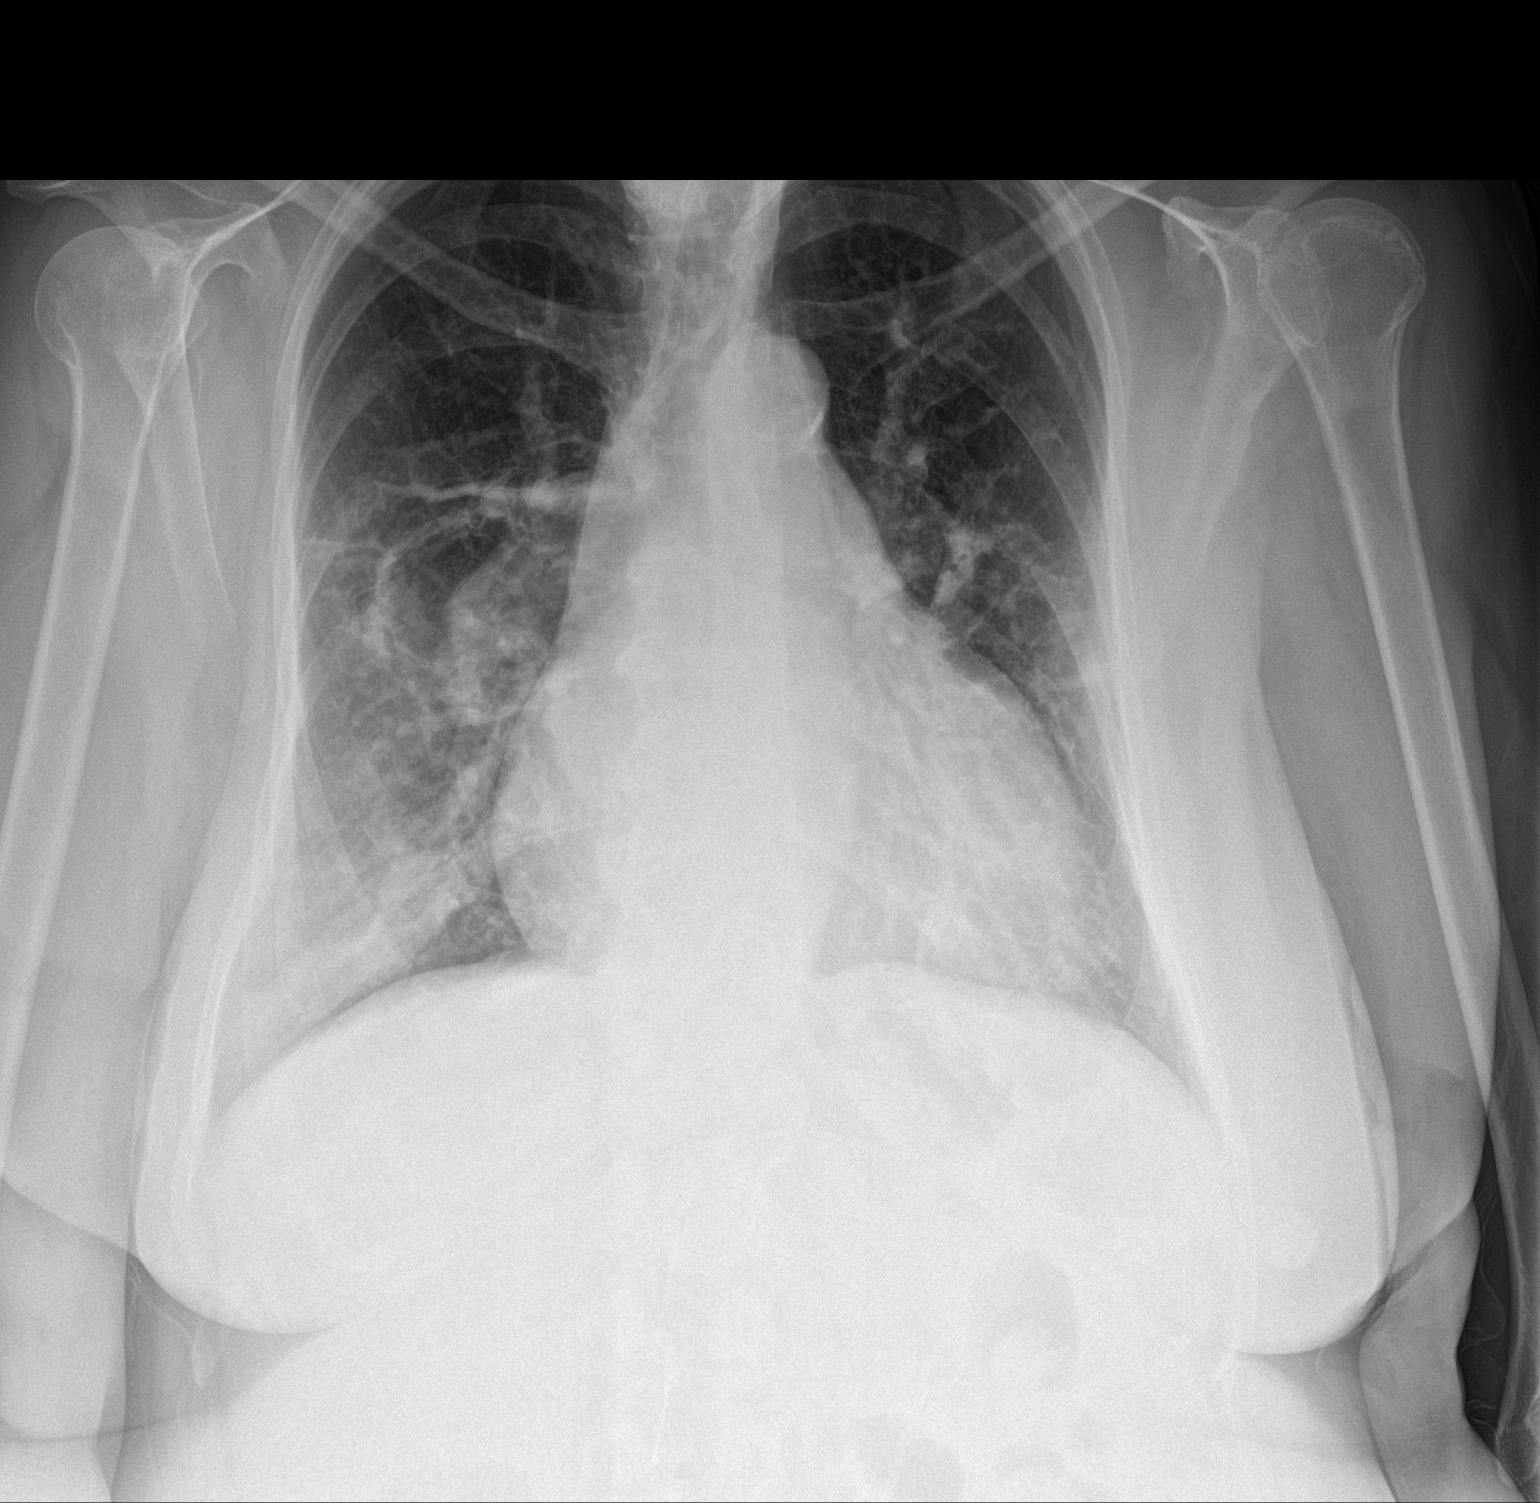
[im 2/2]
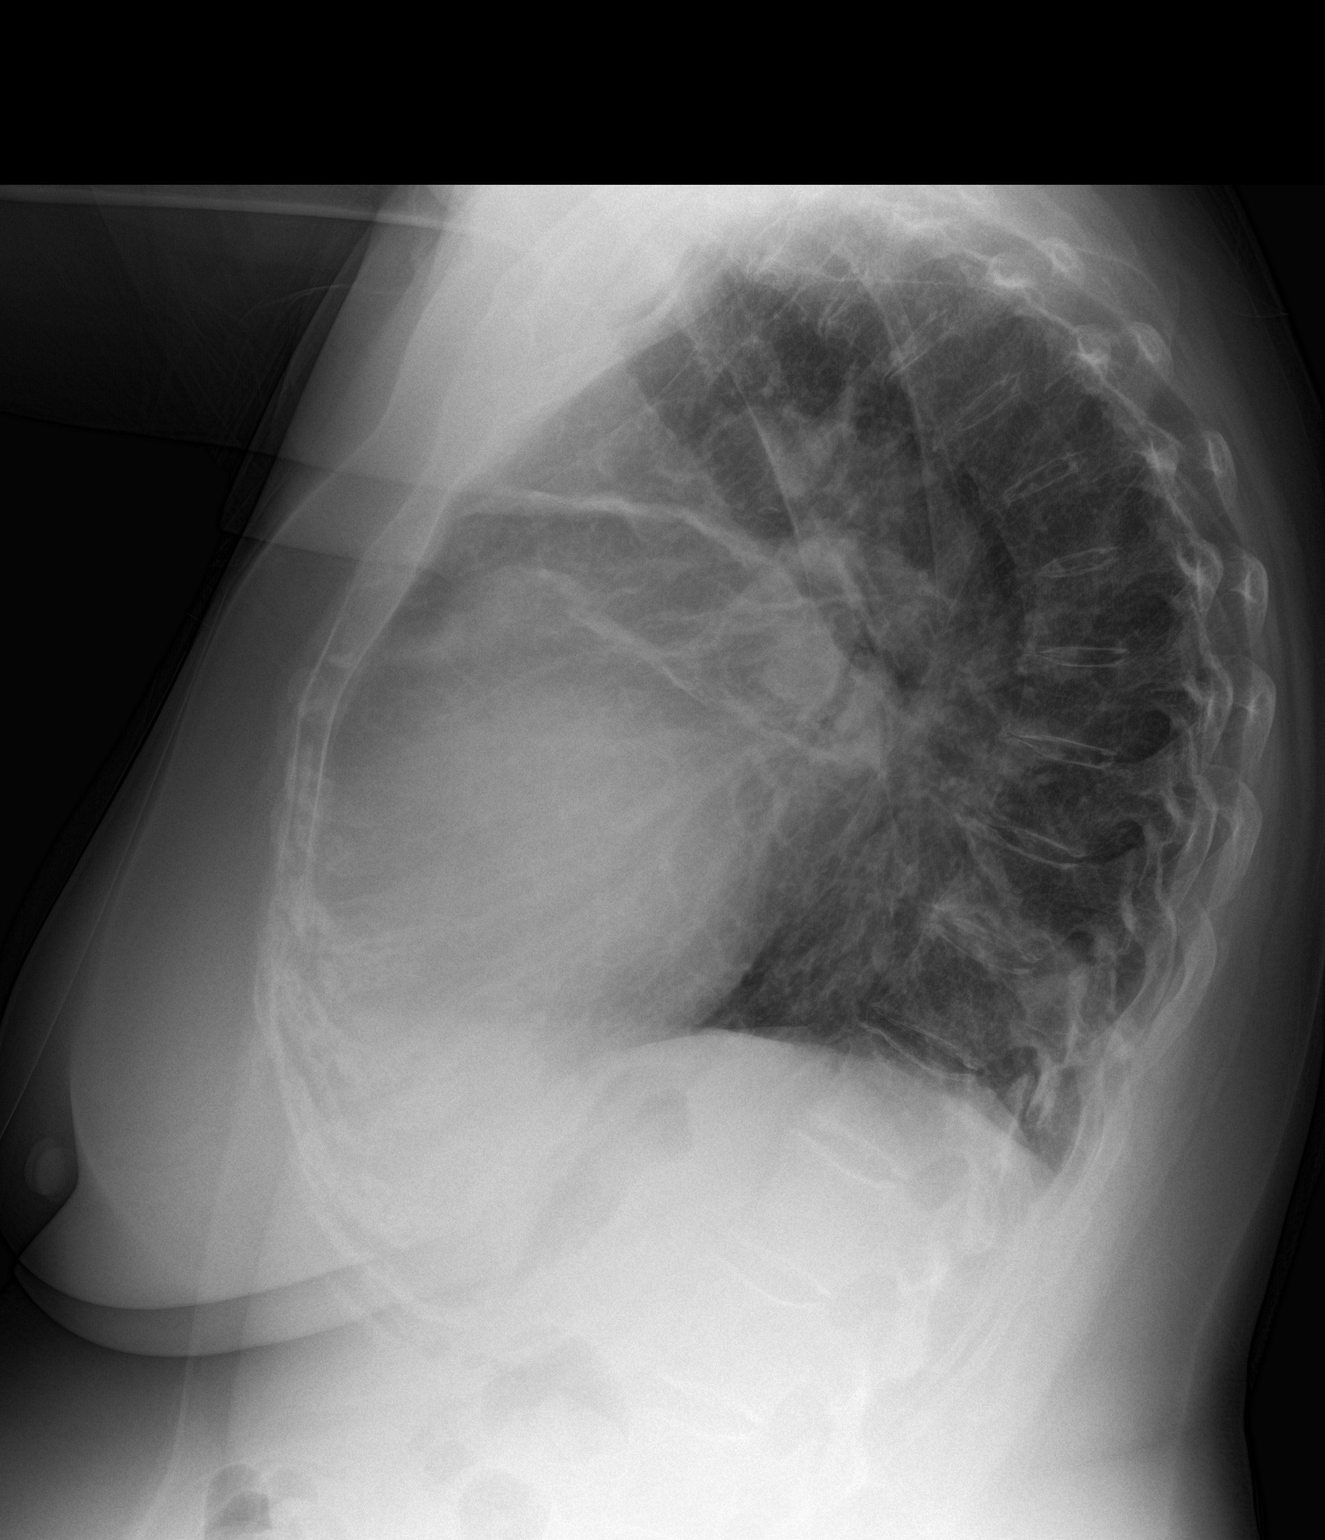

[2 of 2 positions shown; findings below may reference images not displayed]

FINDINGS: Cardiac enlargement without significant vascular congestion. Focal
airspace disease suggested in the right mid and lower lung and
possibly in the left mid lung. Findings may represent acute
pneumonia. No blunting of costophrenic angles. No pneumothorax.
Calcified and tortuous aorta. Degenerative changes in the spine.
IMPRESSION: Cardiac enlargement. New infiltrates in the right mid and lower lung
and possibly in the left mid lung. Findings suggest pneumonia.

## 2016-08-01 ENCOUNTER — Encounter: Payer: Self-pay | Admitting: Emergency Medicine

## 2016-08-01 ENCOUNTER — Emergency Department: Payer: Medicare Other

## 2016-08-01 ENCOUNTER — Emergency Department
Admission: EM | Admit: 2016-08-01 | Discharge: 2016-08-01 | Disposition: A | Discharge status: Home / Self Care | Payer: Medicare Other | Attending: Emergency Medicine | Admitting: Emergency Medicine

## 2016-08-01 ENCOUNTER — Ambulatory Visit
Admission: EM | Admit: 2016-08-01 | Discharge: 2016-08-01 | Disposition: A | Discharge status: Other Acute Inpatient Hospital | Payer: Medicare Other | Attending: Family Medicine | Admitting: Family Medicine

## 2016-08-01 DIAGNOSIS — I2510 Atherosclerotic heart disease of native coronary artery without angina pectoris: Secondary | ICD-10-CM | POA: Insufficient documentation

## 2016-08-01 DIAGNOSIS — Z7982 Long term (current) use of aspirin: Secondary | ICD-10-CM | POA: Diagnosis not present

## 2016-08-01 DIAGNOSIS — K8020 Calculus of gallbladder without cholecystitis without obstruction: Secondary | ICD-10-CM | POA: Insufficient documentation

## 2016-08-01 DIAGNOSIS — R109 Unspecified abdominal pain: Secondary | ICD-10-CM | POA: Diagnosis present

## 2016-08-01 DIAGNOSIS — N390 Urinary tract infection, site not specified: Secondary | ICD-10-CM | POA: Diagnosis not present

## 2016-08-01 DIAGNOSIS — R1011 Right upper quadrant pain: Secondary | ICD-10-CM | POA: Insufficient documentation

## 2016-08-01 DIAGNOSIS — I10 Essential (primary) hypertension: Secondary | ICD-10-CM | POA: Diagnosis not present

## 2016-08-01 DIAGNOSIS — J449 Chronic obstructive pulmonary disease, unspecified: Secondary | ICD-10-CM | POA: Insufficient documentation

## 2016-08-01 DIAGNOSIS — Z87891 Personal history of nicotine dependence: Secondary | ICD-10-CM | POA: Diagnosis not present

## 2016-08-01 DIAGNOSIS — I509 Heart failure, unspecified: Secondary | ICD-10-CM | POA: Diagnosis not present

## 2016-08-01 DIAGNOSIS — Z79899 Other long term (current) drug therapy: Secondary | ICD-10-CM | POA: Insufficient documentation

## 2016-08-01 DIAGNOSIS — R1110 Vomiting, unspecified: Secondary | ICD-10-CM | POA: Diagnosis not present

## 2016-08-01 DIAGNOSIS — K8050 Calculus of bile duct without cholangitis or cholecystitis without obstruction: Secondary | ICD-10-CM

## 2016-08-01 LAB — CBC
HCT: 40.3 % (ref 35.0–47.0)
Hemoglobin: 13.5 g/dL (ref 12.0–16.0)
MCH: 29 pg (ref 26.0–34.0)
MCHC: 33.6 g/dL (ref 32.0–36.0)
MCV: 86.3 fL (ref 80.0–100.0)
PLATELETS: 191 10*3/uL (ref 150–440)
RBC: 4.67 MIL/uL (ref 3.80–5.20)
RDW: 14.2 % (ref 11.5–14.5)
WBC: 6.7 10*3/uL (ref 3.6–11.0)

## 2016-08-01 LAB — COMPREHENSIVE METABOLIC PANEL
ALT: 14 U/L (ref 14–54)
AST: 23 U/L (ref 15–41)
Albumin: 4.5 g/dL (ref 3.5–5.0)
Alkaline Phosphatase: 71 U/L (ref 38–126)
Anion gap: 10 (ref 5–15)
BILIRUBIN TOTAL: 0.8 mg/dL (ref 0.3–1.2)
BUN: 29 mg/dL — ABNORMAL HIGH (ref 6–20)
CHLORIDE: 103 mmol/L (ref 101–111)
CO2: 29 mmol/L (ref 22–32)
CREATININE: 1.23 mg/dL — AB (ref 0.44–1.00)
Calcium: 9.9 mg/dL (ref 8.9–10.3)
GFR, EST AFRICAN AMERICAN: 52 mL/min — AB (ref >60)
GFR, EST NON AFRICAN AMERICAN: 45 mL/min — AB (ref >60)
Glucose, Bld: 94 mg/dL (ref 65–99)
Potassium: 4.4 mmol/L (ref 3.5–5.1)
Sodium: 142 mmol/L (ref 135–145)
TOTAL PROTEIN: 8.2 g/dL — AB (ref 6.5–8.1)

## 2016-08-01 LAB — URINALYSIS, COMPLETE (UACMP) WITH MICROSCOPIC
BILIRUBIN URINE: NEGATIVE
Bacteria, UA: NONE SEEN
GLUCOSE, UA: NEGATIVE mg/dL
HGB URINE DIPSTICK: NEGATIVE
KETONES UR: NEGATIVE mg/dL
NITRITE: NEGATIVE
PH: 5 (ref 5.0–8.0)
Protein, ur: NEGATIVE mg/dL
SPECIFIC GRAVITY, URINE: 1.021 (ref 1.005–1.030)

## 2016-08-01 LAB — LIPASE, BLOOD: LIPASE: 39 U/L (ref 11–51)

## 2016-08-01 MED ORDER — TRAMADOL HCL 50 MG PO TABS: 50.0000 mg | ORAL_TABLET | Freq: Four times a day (QID) | ORAL | 0 refills | Status: DC | PRN

## 2016-08-01 MED ORDER — CEFTRIAXONE SODIUM 1 G IJ SOLR
1.0000 g | Freq: Once | INTRAMUSCULAR | Status: AC
  Administered 2016-08-01: 1 g via INTRAVENOUS
  Filled 2016-08-01: qty 10

## 2016-08-01 MED ORDER — CEPHALEXIN 500 MG PO CAPS: 500.0000 mg | ORAL_CAPSULE | Freq: Three times a day (TID) | ORAL | 0 refills | Status: DC

## 2016-08-01 NOTE — ED Triage Notes (Signed)
Pt sent here from [facility] with c/o RUQ pain x3 days, hx of biliary colic.

## 2016-08-01 NOTE — ED Provider Notes (Signed)
CSN: [id number]     Arrival date & time 08/01/16  1038 History   First MD Initiated Contact with Patient 08/01/16 1054     Chief Complaint  Patient presents with  . Abdominal Pain    RUQ   (Consider location/radiation/quality/duration/timing/severity/associated sxs/prior Treatment) HPI  This a 66 year old female who has multiple comorbidities presents with a three-day history of right upper quadrant pain that has been worsening. She has had some vomiting but no diarrhea. Pain feels constant and she has also noticed some abdominal distention. Has a previous history of gallstones , was seen in 2016 at [facility] for biliary colic sent to [facility] emergency room evaluated by surgeons, at that point time  felt that she was not a candidate for surgery due to her "weak heart". She does have a BiV ICD with an EF of 20%. She also has COPD and has previously had community-acquired pneumonia. Miss Maria Neal does state that her cardiologist has recently said that she could tolerate the surgery.  Past Medical History:  Diagnosis Date  . Anxiety   . CAD (coronary artery disease)   . Cardiomyopathy ([place])   . CHF (congestive heart failure) ([place])   . COPD (chronic obstructive pulmonary disease) ([place])   . Hypertension   . Insomnia    Past Surgical History:  Procedure Laterality Date  . CARDIAC CATHETERIZATION  04/2014   Dr. Idelle Leech  . CARDIAC CATHETERIZATION    . CARDIAC DEFIBRILLATOR PLACEMENT     Family History  Problem Relation Age of Onset  . Diabetes Mellitus II Mother   . Heart attack Father   . Heart disease Brother   . Arthritis Sister    Social History  Substance Use Topics  . Smoking status: Former Research scientist (life sciences)  . Smokeless tobacco: Never Used  . Alcohol use No   OB History    No data available     Review of Systems  Constitutional: Positive for activity change and appetite change. Negative for chills, fatigue and fever.  Gastrointestinal: Positive for abdominal distention, abdominal  pain, nausea and vomiting. Negative for constipation and diarrhea.  All other systems reviewed and are negative.   Allergies  Levaquin [levofloxacin]; Amoxicillin; Nitrofuran derivatives; Penicillins; and Zithromax [azithromycin]  Home Medications   Prior to Admission medications   Medication Sig Start Date End Date Taking? Authorizing Provider  acetaminophen (TYLENOL) 500 MG tablet Take 500-1,000 mg by mouth every 8 (eight) hours as needed for mild pain or headache.     [provider]  albuterol (PROVENTIL HFA;VENTOLIN HFA) 108 (90 Base) MCG/ACT inhaler 2 puffs q 6 hours 01/17/16   Norval Gable, MD  aspirin EC 81 MG tablet Take 81 mg by mouth daily.    [provider]  carvedilol (COREG) 6.25 MG tablet Take 6.25 mg by mouth 2 (two) times daily.     [provider]  doxycycline (VIBRA-TABS) 100 MG tablet Take 1 tablet (100 mg total) by mouth 2 (two) times daily. 01/17/16   Norval Gable, MD  Epigallocatechin Gallate (GREEN TEA) 95 % POWD 1 packet by Does not apply route every other day.     [provider]  furosemide (LASIX) 20 MG tablet Take 20 mg by mouth daily.    [provider]  loratadine (CLARITIN) 10 MG tablet Take 1 tablet (10 mg total) by mouth daily. 11/12/14   Marylene Land, NP  losartan (COZAAR) 50 MG tablet Take 1 tablet by mouth daily. 08/24/14   [provider]  predniSONE (DELTASONE)  20 MG tablet 3 tabs po qd for 2 days, then 2 tabs po qd for 3 days, then 1 tab po qd for 3 days, then half a tab po qd for 2 days 01/17/16   Norval Gable, MD  spironolactone (ALDACTONE) 25 MG tablet Take 25 mg by mouth daily.    [provider]   Meds Ordered and Administered this Visit  Medications - No data to display  BP 107/70 (BP Location: Left Arm)   Pulse 65   Temp 97.7 F (36.5 C) (Oral)   Resp 16   Ht 5\' 4"  (1.626 m)   Wt 200 lb (90.7 kg)   SpO2 99%   BMI 34.33 kg/m  No data found.   Physical Exam   Constitutional: She is oriented to person, place, and time. She appears well-developed and well-nourished. No distress.  HENT:  Head: Normocephalic.  Eyes: Pupils are equal, round, and reactive to light.  Neck: Normal range of motion.  Pulmonary/Chest: Effort normal and breath sounds normal.  Abdominal: Soft. She exhibits distension. There is tenderness. There is guarding. There is no rebound.  Patient has hypotonic bowel sounds. His difficulty lying recumbent and must be semi-reclining. She has tenderness in the right upper quadrant and slightly less so in the epigastrium. There is a positive Murphy sign.  Musculoskeletal: Normal range of motion.  Neurological: She is alert and oriented to person, place, and time.  Skin: Skin is warm and dry. She is not diaphoretic.  Psychiatric: She has a normal mood and affect. Her behavior is normal. Judgment and thought content normal.  Nursing note and vitals reviewed.   Urgent Care Course     Procedures (including critical care time)  Labs Review Labs Reviewed - No data to display  Imaging Review No results found.   Visual Acuity Review  Right Eye Distance:   Left Eye Distance:   Bilateral Distance:    Right Eye Near:   Left Eye Near:    Bilateral Near:         MDM   1. Abdominal pain, right upper quadrant    I had a discussion with the patient regarding her pain. I do not have imaging  ultrasound available today. The patient would be better served by a workup in the emergency room instead  of initiating it here and then having to send her later on. She is agreeable to this. She has a history of biliary colic in the past and there was a question whether surgery should be attempted but because of her of cardiac issues, it was decided to not perform surgical intervention at that time. Since that time,  her cardiologist has told her that she should not delay surgery because of her heart issues as her heart "can take it". It would be  best for the patient to have a workup in the emergency room were all modalities are available. The patient left our facility to go to [facility] ED private vehicle. Triage nurse at [facility] was notified of her condition and of her  arrival.    Juanda Crumble 08/01/16 1154

## 2016-08-01 NOTE — ED Notes (Signed)
Patient reports history of "gallbladder issues" but states she still has her gallbladder. Reports they did not want to take it out due to her cardiac history. Similar symptoms as last flare.

## 2016-08-01 NOTE — ED Notes (Signed)
Patient verbalizes understanding of d/c instructions and follow-up. VS stable and pain controlled per patient.  Patient in NAD at time of d/c and denies further concerns regarding this visit. Patient stable at the time of departure from the unit, departing unit by the safest and most appropriate manner per that patients condition and limitations. Patient advised to return to the ED at any time for emergent concerns, or for new/worsening symptoms.   Patient refused wheelchair out.

## 2016-08-01 NOTE — ED Triage Notes (Signed)
Patient c/o RUQ abdominal pain for the past 3 days.  Patient reports some nausea.  Patient denies V/D.  Patient reports history of gallstones.  Patient reports last bowel movement 3 days ago.

## 2016-08-01 NOTE — ED Provider Notes (Signed)
[facility] Emergency Department Provider Note   ____________________________________________   I have reviewed the triage vital signs and the nursing notes.   HISTORY  Chief Complaint Abdominal Pain   History limited by: Not Limited   HPI Maria Neal is a 66 y.o. female who presents to the emergency department today because of concerns for abdominal pain. Is located in the right upper quadrant. It started roughly 3 days ago. It does come and go. It will last for 2-3 hours. It is severe. It has been accompanied by vomiting. Patient is not as any blood in her stool. She does have a history of gallstones. Still has her gallbladder. She denies any fevers. Denies any dysuria.    Past Medical History:  Diagnosis Date  . Anxiety   . CAD (coronary artery disease)   . Cardiomyopathy ([place])   . CHF (congestive heart failure) ([place])   . COPD (chronic obstructive pulmonary disease) ([place])   . Hypertension   . Insomnia     Patient Active Problem List   Diagnosis Date Noted  . CAP (community acquired pneumonia) 05/07/2014  . Benign essential HTN 05/07/2014  . CAD (coronary artery disease) 05/07/2014  . COPD (chronic obstructive pulmonary disease) ([place]) 05/07/2014    Past Surgical History:  Procedure Laterality Date  . CARDIAC CATHETERIZATION  04/2014   Dr. Idelle Leech  . CARDIAC CATHETERIZATION    . CARDIAC DEFIBRILLATOR PLACEMENT      Prior to Admission medications   Medication Sig Start Date End Date Taking? Authorizing Provider  acetaminophen (TYLENOL) 500 MG tablet Take 500-1,000 mg by mouth every 8 (eight) hours as needed for mild pain or headache.     [provider]  albuterol (PROVENTIL HFA;VENTOLIN HFA) 108 (90 Base) MCG/ACT inhaler 2 puffs q 6 hours 01/17/16   Norval Gable, MD  aspirin EC 81 MG tablet Take 81 mg by mouth daily.    [provider]  carvedilol (COREG) 6.25 MG tablet Take 6.25 mg by mouth 2 (two) times daily.      [provider]  doxycycline (VIBRA-TABS) 100 MG tablet Take 1 tablet (100 mg total) by mouth 2 (two) times daily. 01/17/16   Norval Gable, MD  Epigallocatechin Gallate (GREEN TEA) 95 % POWD 1 packet by Does not apply route every other day.     [provider]  furosemide (LASIX) 20 MG tablet Take 20 mg by mouth daily.    [provider]  loratadine (CLARITIN) 10 MG tablet Take 1 tablet (10 mg total) by mouth daily. 11/12/14   Marylene Land, NP  losartan (COZAAR) 50 MG tablet Take 1 tablet by mouth daily. 08/24/14   [provider]  predniSONE (DELTASONE) 20 MG tablet 3 tabs po qd for 2 days, then 2 tabs po qd for 3 days, then 1 tab po qd for 3 days, then half a tab po qd for 2 days 01/17/16   Norval Gable, MD  spironolactone (ALDACTONE) 25 MG tablet Take 25 mg by mouth daily.    [provider]    Allergies Levaquin [levofloxacin]; Amoxicillin; Nitrofuran derivatives; Penicillins; and Zithromax [azithromycin]  Family History  Problem Relation Age of Onset  . Diabetes Mellitus II Mother   . Heart attack Father   . Heart disease Brother   . Arthritis Sister     Social History Social History  Substance Use Topics  . Smoking status: Former Research scientist (life sciences)  . Smokeless tobacco: Never Used  . Alcohol use No  Review of Systems Constitutional: No fever/chills Eyes: No visual changes. ENT: No sore throat. Cardiovascular: Denies chest pain. Respiratory: Denies shortness of breath. Gastrointestinal: Positive for right upper quadrant pain. Genitourinary: Negative for dysuria. Musculoskeletal: Negative for back pain. Skin: Negative for rash. Neurological: Negative for headaches, focal weakness or numbness.  ____________________________________________   PHYSICAL EXAM:  VITAL SIGNS: ED Triage Vitals  Enc Vitals Group     BP 08/01/16 1158 (!) 126/56     Pulse Rate 08/01/16 1158 66     Resp 08/01/16 1158 16     Temp 08/01/16 1158 97.7 F  (36.5 C)     Temp Source 08/01/16 1158 Oral     SpO2 08/01/16 1158 95 %     Weight 08/01/16 1157 200 lb (90.7 kg)     Height 08/01/16 1157 5\' 4"  (1.626 m)     Head Circumference --      Peak Flow --      Pain Score 08/01/16 1157 7    Constitutional: Alert and oriented. Well appearing and in no distress. Eyes: Conjunctivae are normal.  ENT   Head: Normocephalic and atraumatic.   Nose: No congestion/rhinnorhea.   Mouth/Throat: Mucous membranes are moist.   Neck: No stridor. Hematological/Lymphatic/Immunilogical: No cervical lymphadenopathy. Cardiovascular: Normal rate, regular rhythm.  No murmurs, rubs, or gallops.  Respiratory: Normal respiratory effort without tachypnea nor retractions. Breath sounds are clear and equal bilaterally. No wheezes/rales/rhonchi. Gastrointestinal: Soft and tender to palpation in the right upper quadrant.  Genitourinary: Deferred Musculoskeletal: Normal range of motion in all extremities. No lower extremity edema. Neurologic:  Normal speech and language. No gross focal neurologic deficits are appreciated.  Skin:  Skin is warm, dry and intact. No rash noted. Psychiatric: Mood and affect are normal. Speech and behavior are normal. Patient exhibits appropriate insight and judgment.  ____________________________________________    LABS (pertinent positives/negatives)  Labs Reviewed  COMPREHENSIVE METABOLIC PANEL - Abnormal; Notable for the following:       Result Value   BUN 29 (*)    Creatinine, Ser 1.23 (*)    Total Protein 8.2 (*)    GFR calc non Af Amer 45 (*)    GFR calc Af Amer 52 (*)    All other components within normal limits  URINALYSIS, COMPLETE (UACMP) WITH MICROSCOPIC - Abnormal; Notable for the following:    Color, Urine YELLOW (*)    APPearance HAZY (*)    Leukocytes, UA MODERATE (*)    Squamous Epithelial / LPF 6-30 (*)    All other components within normal limits  URINE CULTURE  LIPASE, BLOOD  CBC      ____________________________________________   EKG  None  ____________________________________________    RADIOLOGY  RUQ US IMPRESSION: Within the fundus of the gallbladder, there is hyperechoic debris representing either nonshadowing small gallstones or possibly dense sludge.  ____________________________________________   PROCEDURES  Procedures  ____________________________________________   INITIAL IMPRESSION / ASSESSMENT AND PLAN / ED COURSE  Pertinent labs & imaging results that were available during my care of the patient were reviewed by me and considered in my medical decision making (see chart for details).  Patient presented to the emergency department today with right upper quadrant pain. Ultrasound does show some sludge versus a very small gallstones. No leukocytosis or elevation of LFTs. At this point I think she might have biliary colic. Her pain did improve during her stay here in the emergency department. Additionally patient's urine was concerning for urinary tract infection. Patient was given  IV antibiotics here. Will give further prescriptions. Discussed with patient importance of follow up with surgery. Discussed return precautions.  ____________________________________________   FINAL CLINICAL IMPRESSION(S) / ED DIAGNOSES  Final diagnoses:  RUQ pain     Note: This dictation was prepared with Dragon dictation. Any transcriptional errors that result from this process are unintentional     Nance Pear, MD 08/01/16 1821

## 2016-08-01 NOTE — Discharge Instructions (Signed)
Please seek medical attention for any high fevers, chest pain, shortness of breath, change in behavior, persistent vomiting, bloody stool or any other new or concerning symptoms.  

## 2016-08-01 NOTE — ED Triage Notes (Signed)
C/O RUQ pain x 3 days.  States pain comes and goes, but never fully resolves.  Has had two episodes of emesis over the past three days as well.  Seen initially at [facility], patient referred to ED for further evaluation.

## 2016-08-03 LAB — URINE CULTURE

## 2016-08-07 ENCOUNTER — Other Ambulatory Visit: Payer: Self-pay

## 2016-08-07 DIAGNOSIS — I428 Other cardiomyopathies: Secondary | ICD-10-CM | POA: Insufficient documentation

## 2016-08-07 DIAGNOSIS — I5022 Chronic systolic (congestive) heart failure: Secondary | ICD-10-CM | POA: Insufficient documentation

## 2016-08-08 ENCOUNTER — Encounter: Payer: Self-pay | Admitting: General Surgery

## 2016-08-08 ENCOUNTER — Ambulatory Visit (INDEPENDENT_AMBULATORY_CARE_PROVIDER_SITE_OTHER): Payer: Medicare Other | Admitting: General Surgery

## 2016-08-08 DIAGNOSIS — K8050 Calculus of bile duct without cholangitis or cholecystitis without obstruction: Secondary | ICD-10-CM | POA: Insufficient documentation

## 2016-08-08 NOTE — Patient Instructions (Signed)
Please look at your blue sheet in case you have any questions about your surgery. Your surgery will be scheduled for 08/22/2016 with Dr. Adonis Huguenin at [facility].  Your appointment with Dr. Saralyn Pilar is on Thursday 08/10/2016 at 10:30 AM.  We will fax a cardiac clearance so Dr. Saralyn Pilar could fax it back to [place].

## 2016-08-08 NOTE — Progress Notes (Signed)
Patient ID: Maria Neal, female   DOB: 1950/10/31, 66 y.o.   MRN: [id number]  CC: Right upper quadrant pain  HPI Maria Neal is a 67 y.o. female who presents to clinic today for evaluation of right upper quadrant pain. Patient reports for the last 3 weeks she's been having intermittent, postprandial right upper quadrant pain. She reports the pain is definitely more intense and worse after eating a fatty or greasy meal. The pain starts about 30 minutes after eating and lasts for 2-3 hours. It has become bad enough where she has sought medical attention twice port. She was seen by [facility] and told that she had biliary colic but the surgeon there did not do surgery secondary to her poor cardiac status. She was seen by our emergency department last week and found to have gallstones without evidence of cholecystitis on imaging. With normal labs. She reports she continues to have intermittent pains but she has been able to eat and tolerate liquids. She denies any fevers, chills, chest pain, shortness breath, diarrhea, constipation. She's had some nausea but denies any vomiting. She is otherwise in her usual state of health. As not seen her cardiologist in the last 6 months.  HPI  Past Medical History:  Diagnosis Date  . Anxiety   . CAD (coronary artery disease)   . Cardiomyopathy ([place])   . CHF (congestive heart failure) ([place])   . COPD (chronic obstructive pulmonary disease) ([place])   . Hypertension   . Insomnia     Past Surgical History:  Procedure Laterality Date  . CARDIAC CATHETERIZATION  04/2014   Dr. Idelle Leech  . CARDIAC CATHETERIZATION    . CARDIAC DEFIBRILLATOR PLACEMENT      Family History  Problem Relation Age of Onset  . Diabetes Mellitus II Mother   . Heart attack Father   . Heart disease Brother   . Arthritis Sister     Social History Social History  Substance Use Topics  . Smoking status: Former Research scientist (life sciences)  . Smokeless tobacco: Never Used  . Alcohol use No     Allergies  Allergen Reactions  . Levaquin [Levofloxacin] Anaphylaxis  . Amoxicillin Hives  . Nitrofuran Derivatives Other (See Comments)    Reaction: unknown  . Penicillins Other (See Comments)    Thinks it made her itch a lot, but isn't sure.  Marland Kitchen Zithromax [Azithromycin] Other (See Comments)  . Antihistamines, Chlorpheniramine-Type Rash    Current Outpatient Prescriptions  Medication Sig Dispense Refill  . acetaminophen (TYLENOL) 500 MG tablet Take 500-1,000 mg by mouth every 8 (eight) hours as needed for mild pain or headache.     . albuterol (PROVENTIL HFA;VENTOLIN HFA) 108 (90 Base) MCG/ACT inhaler 2 puffs q 6 hours 1 Inhaler 0  . aspirin EC 81 MG tablet Take 81 mg by mouth daily.    . carvedilol (COREG) 6.25 MG tablet Take 6.25 mg by mouth 2 (two) times daily.     . cephALEXin (KEFLEX) 500 MG capsule Take 1 capsule (500 mg total) by mouth 3 (three) times daily. 30 capsule 0  . doxycycline (VIBRA-TABS) 100 MG tablet Take 1 tablet (100 mg total) by mouth 2 (two) times daily. 20 tablet 0  . Epigallocatechin Gallate (GREEN TEA) 95 % POWD 1 packet by Does not apply route every other day.     . furosemide (LASIX) 20 MG tablet Take 20 mg by mouth daily.    Marland Kitchen loratadine (CLARITIN) 10 MG tablet Take 1 tablet (10 mg total) by  mouth daily. 7 tablet 0  . losartan (COZAAR) 50 MG tablet Take 1 tablet by mouth daily.    . OXYGEN Inhale 2 L into the lungs daily.    . predniSONE (DELTASONE) 20 MG tablet 3 tabs po qd for 2 days, then 2 tabs po qd for 3 days, then 1 tab po qd for 3 days, then half a tab po qd for 2 days 16 tablet 0  . spironolactone (ALDACTONE) 25 MG tablet Take 25 mg by mouth daily.    . traMADol (ULTRAM) 50 MG tablet Take 1 tablet (50 mg total) by mouth every 6 (six) hours as needed. 15 tablet 0   No current facility-administered medications for this visit.      Review of Systems A Multi-point review of systems was asked and was negative except for the findings documented  in the history of present illness  Physical Exam There were no vitals taken for this visit. CONSTITUTIONAL: No acute distress. EYES: Pupils are equal, round, and reactive to light, Sclera are non-icteric. EARS, NOSE, MOUTH AND THROAT: The oropharynx is clear. The oral mucosa is pink and moist. Hearing is intact to voice. LYMPH NODES:  Lymph nodes in the neck are normal. RESPIRATORY:  Lungs are clear. There is normal respiratory effort, with equal breath sounds bilaterally, and without pathologic use of accessory muscles. CARDIOVASCULAR: Heart is regular without murmurs, gallops, or rubs. GI: The abdomen is large, soft, mildly tender in the right upper quadrant with a negative Murphy sign, and nondistended. There are no palpable masses. There is no hepatosplenomegaly. There are normal bowel sounds in all quadrants. Multiple well-healed prior laparoscopic incision sites from what she says a hiatal hernia repair. GU: Rectal deferred.   MUSCULOSKELETAL: Normal muscle strength and tone. No cyanosis or edema.   SKIN: Turgor is good and there are no pathologic skin lesions or ulcers. NEUROLOGIC: Motor and sensation is grossly normal. Cranial nerves are grossly intact. PSYCH:  Oriented to person, place and time. Affect is normal.  Data Reviewed Images and labs reviewed. Ultrasound shows debris in the fundus of the gallbladder consistent with cholelithiasis versus sludge. All labs with a recent visit to the ER within normal limits. I have personally reviewed the patient's imaging, laboratory findings and medical records.    Assessment    Biliary colic    Plan    66 year old female with biliary colic. I discussed the procedure in detail.  The patient was given Neurosurgeon.  We discussed the risks and benefits of a laparoscopic cholecystectomy and possible cholangiogram including, but not limited to bleeding, infection, injury to surrounding structures such as the intestine or liver, bile  leak, retained gallstones, need to convert to an open procedure, prolonged diarrhea, blood clots such as  DVT, common bile duct injury, anesthesia risks, and possible need for additional procedures.  The likelihood of improvement in symptoms and return to the patient's normal status is good. We discussed the typical post-operative recovery course. Also discussed that the patient is at a much higher risk for surgery due to her cardiac status. She'll require cardiac and anesthesia clearance as prior to being scheduled for surgery. Patient voiced understanding. If we're able to receive the clearance as soon we will schedule surgery otherwise she'll return to clinic after being seen by cardiology and anesthesia.      Time spent with the patient was 45 minutes, with more than 50% of the time spent in face-to-face education, counseling and care coordination.  Clayburn Pert, MD FACS General Surgeon 08/08/2016, 9:44 AM

## 2016-08-10 ENCOUNTER — Telehealth: Payer: Self-pay

## 2016-08-10 DIAGNOSIS — Z95810 Presence of automatic (implantable) cardiac defibrillator: Secondary | ICD-10-CM | POA: Diagnosis not present

## 2016-08-10 DIAGNOSIS — R0602 Shortness of breath: Secondary | ICD-10-CM | POA: Diagnosis not present

## 2016-08-10 DIAGNOSIS — J410 Simple chronic bronchitis: Secondary | ICD-10-CM | POA: Diagnosis not present

## 2016-08-10 DIAGNOSIS — Z9889 Other specified postprocedural states: Secondary | ICD-10-CM | POA: Diagnosis not present

## 2016-08-10 DIAGNOSIS — I428 Other cardiomyopathies: Secondary | ICD-10-CM | POA: Diagnosis not present

## 2016-08-10 DIAGNOSIS — Z01810 Encounter for preprocedural cardiovascular examination: Secondary | ICD-10-CM | POA: Diagnosis not present

## 2016-08-10 DIAGNOSIS — I5022 Chronic systolic (congestive) heart failure: Secondary | ICD-10-CM | POA: Diagnosis not present

## 2016-08-10 DIAGNOSIS — I5023 Acute on chronic systolic (congestive) heart failure: Secondary | ICD-10-CM | POA: Diagnosis not present

## 2016-08-10 DIAGNOSIS — I10 Essential (primary) hypertension: Secondary | ICD-10-CM | POA: Diagnosis not present

## 2016-08-10 DIAGNOSIS — I447 Left bundle-branch block, unspecified: Secondary | ICD-10-CM | POA: Diagnosis not present

## 2016-08-10 NOTE — Telephone Encounter (Signed)
Cardiac and Anti-Coagulant clearances were faxed to Dr. Saralyn Pilar office. Awaiting on response.

## 2016-08-11 ENCOUNTER — Telehealth: Payer: Self-pay

## 2016-08-11 NOTE — Telephone Encounter (Signed)
Medical clearances have been received back and are completed. These clearances can be found under the [employer] tab.

## 2016-08-11 NOTE — Telephone Encounter (Signed)
Patient's Cardiac and ASA clearance were obtained by Dr. Saralyn Pilar.

## 2016-08-11 NOTE — Telephone Encounter (Signed)
Patient's cardiac and ASA clearances were returned. I sent a staff message to Dr. Clydene Laming ham to see if he wants to schedule patient for surgery. Awaiting on his response.

## 2016-08-14 ENCOUNTER — Telehealth: Payer: Self-pay | Admitting: General Surgery

## 2016-08-14 NOTE — Telephone Encounter (Signed)
Pt advised of pre op date/time and sx date. Sx: 08/23/16 with Dr Gabrielle Dare cholecystectomy.  Pre op: 08/18/16 @ 3:00pm--Office.   Patient made aware to call [phone], between 1-3:00pm the day before surgery, to find out what time to arrive.

## 2016-08-18 ENCOUNTER — Encounter
Admission: RE | Admit: 2016-08-18 | Discharge: 2016-08-18 | Disposition: A | Discharge status: Home / Self Care | Payer: Medicare Other | Source: Ambulatory Visit | Attending: General Surgery | Admitting: General Surgery

## 2016-08-18 ENCOUNTER — Ambulatory Visit
Admission: RE | Admit: 2016-08-18 | Discharge: 2016-08-18 | Disposition: A | Discharge status: Home / Self Care | Payer: Medicare Other | Source: Ambulatory Visit | Attending: General Surgery | Admitting: General Surgery

## 2016-08-18 ENCOUNTER — Other Ambulatory Visit: Payer: Self-pay

## 2016-08-18 DIAGNOSIS — I447 Left bundle-branch block, unspecified: Secondary | ICD-10-CM | POA: Insufficient documentation

## 2016-08-18 DIAGNOSIS — I517 Cardiomegaly: Secondary | ICD-10-CM | POA: Diagnosis not present

## 2016-08-18 DIAGNOSIS — K8020 Calculus of gallbladder without cholecystitis without obstruction: Secondary | ICD-10-CM

## 2016-08-18 DIAGNOSIS — Z01818 Encounter for other preprocedural examination: Secondary | ICD-10-CM | POA: Diagnosis not present

## 2016-08-18 DIAGNOSIS — Z01810 Encounter for preprocedural cardiovascular examination: Secondary | ICD-10-CM | POA: Insufficient documentation

## 2016-08-18 DIAGNOSIS — Z01811 Encounter for preprocedural respiratory examination: Secondary | ICD-10-CM

## 2016-08-18 DIAGNOSIS — J449 Chronic obstructive pulmonary disease, unspecified: Secondary | ICD-10-CM | POA: Diagnosis not present

## 2016-08-18 DIAGNOSIS — I10 Essential (primary) hypertension: Secondary | ICD-10-CM | POA: Diagnosis not present

## 2016-08-18 DIAGNOSIS — R0602 Shortness of breath: Secondary | ICD-10-CM | POA: Diagnosis not present

## 2016-08-18 HISTORY — DX: Headache: R51

## 2016-08-18 HISTORY — DX: Restless legs syndrome: G25.81

## 2016-08-18 HISTORY — DX: Headache, unspecified: R51.9

## 2016-08-18 HISTORY — DX: Depression, unspecified: F32.A

## 2016-08-18 HISTORY — DX: Presence of automatic (implantable) cardiac defibrillator: Z95.810

## 2016-08-18 HISTORY — DX: Dependence on supplemental oxygen: Z99.81

## 2016-08-18 HISTORY — DX: Dyspnea, unspecified: R06.00

## 2016-08-18 HISTORY — DX: Personal history of other infectious and parasitic diseases: Z86.19

## 2016-08-18 HISTORY — DX: Major depressive disorder, single episode, unspecified: F32.9

## 2016-08-18 NOTE — Patient Instructions (Signed)
Your procedure is scheduled on: August 23, 2016 Wednesday ) Report to Same Day Surgery 2nd floor medical mall [facility] elevator on left to 2nd floor.  Check in with surgery information desk.) To find out your arrival time please call [phone] between 1PM - 3PM on August 22, 2016 (TUESDAY)  ) Remember: Instructions that are not followed completely may result in serious medical risk, up to and including death, or upon the discretion of your surgeon and anesthesiologist your surgery may need to be rescheduled.    _x___ 1. Do not eat food or drink liquids after midnight. No gum chewing or  hard candies                        .     __x__ 2. No Alcohol for 24 hours before or after surgery.   __x__3. No Smoking for 24 prior to surgery.   ____  4. Bring all medications with you on the day of surgery if instructed.    __x__ 5. Notify your doctor if there is any change in your medical condition     (cold, fever, infections).     Do not wear jewelry, make-up, hairpins, clips or nail polish.  Do not wear lotions, powders, or perfumes.  Do not shave 48 hours prior to surgery. Men may shave face and neck.  Do not bring valuables to the hospital.    [facility] is not responsible for any belongings or valuables.               Contacts, dentures or bridgework may not be worn into surgery.  Leave your suitcase in the car. After surgery it may be brought to your room.  For patients admitted to the hospital, discharge time is determined by your treatment team                    Patients discharged the day of surgery will not be allowed to drive home.  You will need someone to drive you home and stay with you the night of your procedure.    Please read over the following fact sheets that you were given:   [facility] Preparing for Surgery and or MRSA Information   TAKE THE FOLLOWING MEDICATIONS WITH A SIP OF [place] MORNING OF SURGERY    1.  CARVEDILOL  2.  3.  4.  5.  6.  ____Fleets enema or Magnesium Citrate as directed.   _x___ Use CHG Soap or sage wipes as directed on instruction sheet   _x___ Use inhalers on the day of surgery and bring to hospital day of surgery (USE ALBUTEROL INHALER THE MORNING OF SURGERY AND [place]  )  ____ Stop Metformin and Janumet 2 days prior to surgery.    ____ Take 1/2 of usual insulin dose the night before surgery and none on the morning surgery     _x___ Follow recommendations from Cardiologist, Pulmonologist or PCP regarding          stopping Aspirin, Coumadin, Plavix ,Eliquis, Effient, or Pradaxa, and Pletal. (CALL DR, [facility] OFFICE AND ASK ABOUT STOPPING ASPIRIN )  X____Stop Anti-inflammatories such as Advil, Aleve, Ibuprofen, Motrin, Naproxen, Naprosyn, Goodies powders or aspirin products. OK to take Tylenol   _x___ Stop supplements until after surgery.  But may continue Vitamin D, Vitamin B,and multivitamin (STOP GREEN TEA NOW )      ____ Bring C-Pap to the hospital.

## 2016-08-21 NOTE — Pre-Procedure Instructions (Signed)
Cardiac clearance on chart\ Dr Saralyn Pilar.

## 2016-08-22 MED ORDER — VANCOMYCIN HCL IN DEXTROSE 1-5 GM/200ML-% IV SOLN
1000.0000 mg | INTRAVENOUS | Status: AC
  Administered 2016-08-23: 1000 mg via INTRAVENOUS

## 2016-08-23 ENCOUNTER — Ambulatory Visit: Payer: Medicare Other | Admitting: Anesthesiology

## 2016-08-23 ENCOUNTER — Encounter: Payer: Self-pay | Admitting: *Deleted

## 2016-08-23 ENCOUNTER — Encounter
Admission: RE | Disposition: A | Discharge status: Home / Self Care | Payer: Self-pay | Source: Ambulatory Visit | Attending: General Surgery

## 2016-08-23 ENCOUNTER — Ambulatory Visit
Admission: RE | Admit: 2016-08-23 | Discharge: 2016-08-23 | Disposition: A | Discharge status: Home / Self Care | Payer: Medicare Other | Source: Ambulatory Visit | Attending: General Surgery | Admitting: General Surgery

## 2016-08-23 DIAGNOSIS — I509 Heart failure, unspecified: Secondary | ICD-10-CM | POA: Diagnosis not present

## 2016-08-23 DIAGNOSIS — Z880 Allergy status to penicillin: Secondary | ICD-10-CM | POA: Insufficient documentation

## 2016-08-23 DIAGNOSIS — F419 Anxiety disorder, unspecified: Secondary | ICD-10-CM | POA: Diagnosis not present

## 2016-08-23 DIAGNOSIS — K8010 Calculus of gallbladder with chronic cholecystitis without obstruction: Secondary | ICD-10-CM | POA: Diagnosis not present

## 2016-08-23 DIAGNOSIS — F329 Major depressive disorder, single episode, unspecified: Secondary | ICD-10-CM | POA: Diagnosis not present

## 2016-08-23 DIAGNOSIS — G4700 Insomnia, unspecified: Secondary | ICD-10-CM | POA: Insufficient documentation

## 2016-08-23 DIAGNOSIS — Z9981 Dependence on supplemental oxygen: Secondary | ICD-10-CM | POA: Diagnosis not present

## 2016-08-23 DIAGNOSIS — K819 Cholecystitis, unspecified: Secondary | ICD-10-CM | POA: Diagnosis present

## 2016-08-23 DIAGNOSIS — Z87891 Personal history of nicotine dependence: Secondary | ICD-10-CM | POA: Diagnosis not present

## 2016-08-23 DIAGNOSIS — R1011 Right upper quadrant pain: Secondary | ICD-10-CM | POA: Diagnosis not present

## 2016-08-23 DIAGNOSIS — Z7982 Long term (current) use of aspirin: Secondary | ICD-10-CM | POA: Diagnosis not present

## 2016-08-23 DIAGNOSIS — Z79899 Other long term (current) drug therapy: Secondary | ICD-10-CM | POA: Diagnosis not present

## 2016-08-23 DIAGNOSIS — J449 Chronic obstructive pulmonary disease, unspecified: Secondary | ICD-10-CM | POA: Diagnosis not present

## 2016-08-23 DIAGNOSIS — I2510 Atherosclerotic heart disease of native coronary artery without angina pectoris: Secondary | ICD-10-CM | POA: Diagnosis not present

## 2016-08-23 DIAGNOSIS — I429 Cardiomyopathy, unspecified: Secondary | ICD-10-CM | POA: Diagnosis not present

## 2016-08-23 DIAGNOSIS — K8000 Calculus of gallbladder with acute cholecystitis without obstruction: Secondary | ICD-10-CM | POA: Diagnosis not present

## 2016-08-23 DIAGNOSIS — I110 Hypertensive heart disease with heart failure: Secondary | ICD-10-CM | POA: Diagnosis not present

## 2016-08-23 HISTORY — PX: CHOLECYSTECTOMY: SHX55

## 2016-08-23 SURGERY — LAPAROSCOPIC CHOLECYSTECTOMY
Anesthesia: General | Wound class: Clean Contaminated

## 2016-08-23 MED ORDER — LIDOCAINE HCL (PF) 2 % IJ SOLN
INTRAMUSCULAR | Status: AC
  Filled 2016-08-23: qty 2

## 2016-08-23 MED ORDER — VANCOMYCIN HCL IN DEXTROSE 1-5 GM/200ML-% IV SOLN
INTRAVENOUS | Status: AC
  Administered 2016-08-23: 1000 mg via INTRAVENOUS
  Filled 2016-08-23: qty 200

## 2016-08-23 MED ORDER — HYDROCODONE-ACETAMINOPHEN 5-325 MG PO TABS: 1.0000 | ORAL_TABLET | ORAL | 0 refills | Status: DC | PRN

## 2016-08-23 MED ORDER — MIDAZOLAM HCL 2 MG/2ML IJ SOLN
INTRAMUSCULAR | Status: DC | PRN
  Administered 2016-08-23: 2 mg via INTRAVENOUS

## 2016-08-23 MED ORDER — ROCURONIUM BROMIDE 100 MG/10ML IV SOLN
INTRAVENOUS | Status: DC | PRN
  Administered 2016-08-23: 10 mg via INTRAVENOUS
  Administered 2016-08-23: 40 mg via INTRAVENOUS

## 2016-08-23 MED ORDER — PROPOFOL 10 MG/ML IV BOLUS
INTRAVENOUS | Status: DC | PRN
  Administered 2016-08-23: 120 mg via INTRAVENOUS

## 2016-08-23 MED ORDER — OXYCODONE HCL 5 MG/5ML PO SOLN: 5.0000 mg | Freq: Once | ORAL | Status: AC | PRN

## 2016-08-23 MED ORDER — ROCURONIUM BROMIDE 50 MG/5ML IV SOLN
INTRAVENOUS | Status: AC
  Filled 2016-08-23: qty 1

## 2016-08-23 MED ORDER — FENTANYL CITRATE (PF) 100 MCG/2ML IJ SOLN
INTRAMUSCULAR | Status: AC
  Filled 2016-08-23: qty 2

## 2016-08-23 MED ORDER — ONDANSETRON HCL 4 MG/2ML IJ SOLN
INTRAMUSCULAR | Status: AC
  Filled 2016-08-23: qty 2

## 2016-08-23 MED ORDER — ONDANSETRON HCL 4 MG/2ML IJ SOLN
INTRAMUSCULAR | Status: DC | PRN
  Administered 2016-08-23: 4 mg via INTRAVENOUS

## 2016-08-23 MED ORDER — CHLORHEXIDINE GLUCONATE CLOTH 2 % EX PADS: 6.0000 | MEDICATED_PAD | Freq: Once | CUTANEOUS | Status: DC

## 2016-08-23 MED ORDER — FENTANYL CITRATE (PF) 100 MCG/2ML IJ SOLN
25.0000 ug | INTRAMUSCULAR | Status: DC | PRN
  Administered 2016-08-23 (×2): 50 ug via INTRAVENOUS

## 2016-08-23 MED ORDER — MIDAZOLAM HCL 2 MG/2ML IJ SOLN
INTRAMUSCULAR | Status: AC
  Filled 2016-08-23: qty 2

## 2016-08-23 MED ORDER — FAMOTIDINE 20 MG PO TABS
ORAL_TABLET | ORAL | Status: AC
  Administered 2016-08-23: 20 mg via ORAL
  Filled 2016-08-23: qty 1

## 2016-08-23 MED ORDER — ACETAMINOPHEN 10 MG/ML IV SOLN
INTRAVENOUS | Status: DC | PRN
  Administered 2016-08-23: 1000 mg via INTRAVENOUS

## 2016-08-23 MED ORDER — FENTANYL CITRATE (PF) 100 MCG/2ML IJ SOLN
INTRAMUSCULAR | Status: AC
  Administered 2016-08-23: 50 ug via INTRAVENOUS
  Filled 2016-08-23: qty 2

## 2016-08-23 MED ORDER — FENTANYL CITRATE (PF) 100 MCG/2ML IJ SOLN
INTRAMUSCULAR | Status: DC | PRN
  Administered 2016-08-23: 100 ug via INTRAVENOUS

## 2016-08-23 MED ORDER — LIDOCAINE HCL (CARDIAC) 20 MG/ML IV SOLN
INTRAVENOUS | Status: DC | PRN
  Administered 2016-08-23: 80 mg via INTRAVENOUS

## 2016-08-23 MED ORDER — OXYCODONE HCL 5 MG PO TABS
ORAL_TABLET | ORAL | Status: AC
  Administered 2016-08-23: 5 mg via ORAL
  Filled 2016-08-23: qty 1

## 2016-08-23 MED ORDER — SUGAMMADEX SODIUM 200 MG/2ML IV SOLN
INTRAVENOUS | Status: AC
  Filled 2016-08-23: qty 2

## 2016-08-23 MED ORDER — LACTATED RINGERS IV SOLN
INTRAVENOUS | Status: DC
  Administered 2016-08-23: 09:00:00 via INTRAVENOUS

## 2016-08-23 MED ORDER — PHENYLEPHRINE HCL 10 MG/ML IJ SOLN
INTRAMUSCULAR | Status: DC | PRN
  Administered 2016-08-23: 200 ug via INTRAVENOUS
  Administered 2016-08-23: 300 ug via INTRAVENOUS
  Administered 2016-08-23: 100 ug via INTRAVENOUS
  Administered 2016-08-23: 200 ug via INTRAVENOUS
  Administered 2016-08-23: 100 ug via INTRAVENOUS

## 2016-08-23 MED ORDER — OXYCODONE HCL 5 MG PO TABS
5.0000 mg | ORAL_TABLET | Freq: Once | ORAL | Status: AC | PRN
  Administered 2016-08-23: 5 mg via ORAL

## 2016-08-23 MED ORDER — LIDOCAINE HCL 1 % IJ SOLN
INTRAMUSCULAR | Status: DC | PRN
  Administered 2016-08-23: 33 mL

## 2016-08-23 MED ORDER — MEPERIDINE HCL 50 MG/ML IJ SOLN: 6.2500 mg | INTRAMUSCULAR | Status: DC | PRN

## 2016-08-23 MED ORDER — DEXAMETHASONE SODIUM PHOSPHATE 10 MG/ML IJ SOLN
INTRAMUSCULAR | Status: AC
  Filled 2016-08-23: qty 1

## 2016-08-23 MED ORDER — PROMETHAZINE HCL 25 MG/ML IJ SOLN: 6.2500 mg | INTRAMUSCULAR | Status: DC | PRN

## 2016-08-23 MED ORDER — BUPIVACAINE HCL (PF) 0.5 % IJ SOLN
INTRAMUSCULAR | Status: AC
  Filled 2016-08-23: qty 30

## 2016-08-23 MED ORDER — FAMOTIDINE 20 MG PO TABS
20.0000 mg | ORAL_TABLET | Freq: Once | ORAL | Status: AC
  Administered 2016-08-23: 20 mg via ORAL

## 2016-08-23 MED ORDER — PROPOFOL 10 MG/ML IV BOLUS
INTRAVENOUS | Status: AC
  Filled 2016-08-23: qty 20

## 2016-08-23 MED ORDER — SUGAMMADEX SODIUM 200 MG/2ML IV SOLN
INTRAVENOUS | Status: DC | PRN
  Administered 2016-08-23: 200 mg via INTRAVENOUS

## 2016-08-23 MED ORDER — LIDOCAINE HCL (PF) 1 % IJ SOLN
INTRAMUSCULAR | Status: AC
  Filled 2016-08-23: qty 30

## 2016-08-23 MED ORDER — DEXAMETHASONE SODIUM PHOSPHATE 10 MG/ML IJ SOLN
INTRAMUSCULAR | Status: DC | PRN
  Administered 2016-08-23: 5 mg via INTRAVENOUS

## 2016-08-23 SURGICAL SUPPLY — 43 items
ADHESIVE MASTISOL STRL (MISCELLANEOUS) ×2 IMPLANT
APPLIER CLIP ROT 10 11.4 M/L (STAPLE) ×2
BAG COUNTER SPONGE EZ (MISCELLANEOUS) IMPLANT
BLADE SURG SZ11 CARB STEEL (BLADE) ×2 IMPLANT
CANISTER SUCT 1200ML W/VALVE (MISCELLANEOUS) ×2 IMPLANT
CATH CHOLANG 76X19 KUMAR (CATHETERS) IMPLANT
CHLORAPREP W/TINT 26ML (MISCELLANEOUS) ×2 IMPLANT
CLIP APPLIE ROT 10 11.4 M/L (STAPLE) ×1 IMPLANT
CONRAY 60ML FOR OR (MISCELLANEOUS) IMPLANT
DECANTER SPIKE VIAL GLASS SM (MISCELLANEOUS) ×4 IMPLANT
DRAPE SHEET LG 3/4 BI-LAMINATE (DRAPES) IMPLANT
DRSG TEGADERM 2-3/8X2-3/4 SM (GAUZE/BANDAGES/DRESSINGS) ×8 IMPLANT
DRSG TELFA 4X3 1S NADH ST (GAUZE/BANDAGES/DRESSINGS) ×2 IMPLANT
ELECT REM PT RETURN 9FT ADLT (ELECTROSURGICAL) ×2
ELECTRODE REM PT RTRN 9FT ADLT (ELECTROSURGICAL) ×1 IMPLANT
GLOVE BIO SURGEON STRL SZ7.5 (GLOVE) ×6 IMPLANT
GLOVE INDICATOR 8.0 STRL GRN (GLOVE) ×6 IMPLANT
GOWN STRL REUS W/ TWL LRG LVL3 (GOWN DISPOSABLE) ×3 IMPLANT
GOWN STRL REUS W/TWL LRG LVL3 (GOWN DISPOSABLE) ×3
GRASPER SUT TROCAR 14GX15 (MISCELLANEOUS) IMPLANT
IRRIGATION STRYKERFLOW (MISCELLANEOUS) IMPLANT
IRRIGATOR STRYKERFLOW (MISCELLANEOUS)
IV NS 1000ML (IV SOLUTION) ×1
IV NS 1000ML BAXH (IV SOLUTION) ×1 IMPLANT
L-HOOK LAP DISP 36CM (ELECTROSURGICAL) ×2
LABEL OR SOLS (LABEL) IMPLANT
LHOOK LAP DISP 36CM (ELECTROSURGICAL) ×1 IMPLANT
NEEDLE HYPO 25X1 1.5 SAFETY (NEEDLE) ×2 IMPLANT
NEEDLE VERESS 14GA 120MM (NEEDLE) ×2 IMPLANT
NS IRRIG 500ML POUR BTL (IV SOLUTION) ×2 IMPLANT
PACK LAP CHOLECYSTECTOMY (MISCELLANEOUS) ×2 IMPLANT
PENCIL ELECTRO HAND CTR (MISCELLANEOUS) ×2 IMPLANT
POUCH ENDO CATCH 10MM SPEC (MISCELLANEOUS) ×2 IMPLANT
SCISSORS METZENBAUM CVD 33 (INSTRUMENTS) ×2 IMPLANT
SLEEVE ENDOPATH XCEL 5M (ENDOMECHANICALS) ×4 IMPLANT
STRIP CLOSURE SKIN 1/2X4 (GAUZE/BANDAGES/DRESSINGS) ×2 IMPLANT
SUT MNCRL 4-0 (SUTURE) ×2
SUT MNCRL 4-0 27XMFL (SUTURE) ×2
SUT VICRYL 0 AB UR-6 (SUTURE) ×2 IMPLANT
SUTURE MNCRL 4-0 27XMF (SUTURE) ×2 IMPLANT
TROCAR XCEL 12X100 BLDLESS (ENDOMECHANICALS) ×2 IMPLANT
TROCAR XCEL NON-BLD 5MMX100MML (ENDOMECHANICALS) ×2 IMPLANT
TUBING INSUFFLATOR HI FLOW (MISCELLANEOUS) ×2 IMPLANT

## 2016-08-23 NOTE — Discharge Instructions (Signed)
Laparoscopic Cholecystectomy, Care After °This sheet gives you information about how to care for yourself after your procedure. Your health care provider may also give you more specific instructions. If you have problems or questions, contact your health care provider. °What can I expect after the procedure? °After the procedure, it is common to have: °· Pain at your incision sites. You will be given medicines to control this pain. °· Mild nausea or vomiting. °· Bloating and possible shoulder pain from the air-like gas that was used during the procedure. °Follow these instructions at home: °Incision care  ° °· Follow instructions from your health care provider about how to take care of your incisions. Make sure you: °¨ Wash your hands with soap and water before you change your bandage (dressing). If soap and water are not available, use hand sanitizer. °¨ Change your dressing as told by your health care provider. °¨ Leave stitches (sutures), skin glue, or adhesive strips in place. These skin closures may need to be in place for 2 weeks or longer. If adhesive strip edges start to loosen and curl up, you may trim the loose edges. Do not remove adhesive strips completely unless your health care provider tells you to do that. °· Do not take baths, swim, or use a hot tub until your health care provider approves. Ask your health care provider if you can take showers. You may only be allowed to take sponge baths for bathing. °· Check your incision area every day for signs of infection. Check for: °¨ More redness, swelling, or pain. °¨ More fluid or blood. °¨ Warmth. °¨ Pus or a bad smell. °Activity  °· Do not drive or use heavy machinery while taking prescription pain medicine. °· Do not lift anything that is heavier than 10 lb (4.5 kg) until your health care provider approves. °· Do not play contact sports until your health care provider approves. °· Do not drive for 24 hours if you were given a medicine to help you relax  (sedative). °· Rest as needed. Do not return to work or school until your health care provider approves. °General instructions  °· Take over-the-counter and prescription medicines only as told by your health care provider. °· To prevent or treat constipation while you are taking prescription pain medicine, your health care provider may recommend that you: °¨ Drink enough fluid to keep your urine clear or pale yellow. °¨ Take over-the-counter or prescription medicines. °¨ Eat foods that are high in fiber, such as fresh fruits and vegetables, whole grains, and beans. °¨ Limit foods that are high in fat and processed sugars, such as fried and sweet foods. °Contact a health care provider if: °· You develop a rash. °· You have more redness, swelling, or pain around your incisions. °· You have more fluid or blood coming from your incisions. °· Your incisions feel warm to the touch. °· You have pus or a bad smell coming from your incisions. °· You have a fever. °· One or more of your incisions breaks open. °Get help right away if: °· You have trouble breathing. °· You have chest pain. °· You have increasing pain in your shoulders. °· You faint or feel dizzy when you stand. °· You have severe pain in your abdomen. °· You have nausea or vomiting that lasts for more than one day. °· You have leg pain. °This information is not intended to replace advice given to you by your health care provider. Make sure you discuss any   questions you have with your health care provider. °Document Released: 12/19/2004 Document Revised: 07/10/2015 Document Reviewed: 06/07/2015 °Elsevier Interactive Patient Education © 2017 [employer] ° °

## 2016-08-23 NOTE — Interval H&P Note (Signed)
History and Physical Interval Note:  08/23/2016 8:35 AM  Maria Neal  has presented today for surgery, with the diagnosis of RIGHT UPPER QUADRANT PAIN  The various methods of treatment have been discussed with the patient and family. After consideration of risks, benefits and other options for treatment, the patient has consented to  Procedure(s): LAPAROSCOPIC CHOLECYSTECTOMY (N/A) as a surgical intervention .  The patient's history has been reviewed, patient examined, no change in status, stable for surgery.  I have reviewed the patient's chart and labs.  Questions were answered to the patient's satisfaction.     Clayburn Pert

## 2016-08-23 NOTE — Anesthesia Post-op Follow-up Note (Signed)
Anesthesia QCDR form completed.        

## 2016-08-23 NOTE — Anesthesia Postprocedure Evaluation (Signed)
Anesthesia Post Note  Patient: Maria Neal  Procedure(s) Performed: Procedure(s) (LRB): LAPAROSCOPIC CHOLECYSTECTOMY (N/A)  Patient location during evaluation: PACU Anesthesia Type: General Level of consciousness: awake and alert and oriented Pain management: pain level controlled Vital Signs Assessment: post-procedure vital signs reviewed and stable Respiratory status: spontaneous breathing, nonlabored ventilation and respiratory function stable Cardiovascular status: blood pressure returned to baseline and stable Postop Assessment: no signs of nausea or vomiting Anesthetic complications: no     Last Vitals:  Vitals:   08/23/16 1209 08/23/16 1224  BP: 135/61 (!) 123/56  Pulse: 72 68  Resp: 17 11  Temp:  36.6 C  SpO2: 98% 96%    Last Pain:  Vitals:   08/23/16 1224  TempSrc:   PainSc: 4                  Maria Neal

## 2016-08-23 NOTE — Anesthesia Preprocedure Evaluation (Addendum)
Anesthesia Evaluation  Patient identified by MRN, date of birth, ID band Patient awake    Reviewed: Allergy & Precautions, NPO status , Patient's Chart, lab work & pertinent test results  History of Anesthesia Complications Negative for: history of anesthetic complications  Airway Mallampati: II  TM Distance: >3 FB Neck ROM: Full    Dental  (+) Poor Dentition, Missing, Edentulous Upper   Pulmonary shortness of breath, neg sleep apnea, COPD,  COPD inhaler and oxygen dependent, former smoker,    breath sounds clear to auscultation- rhonchi (-) wheezing      Cardiovascular hypertension, +CHF (NICM, EF 20%)  (-) CAD, (-) Past MI and (-) Cardiac Stents + pacemaker (BiV ICD) + Cardiac Defibrillator  Rhythm:Regular Rate:Normal - Systolic murmurs and - Diastolic murmurs Echo 53/6/64: SEVERE LV SYSTOLIC DYSFUNCTION (See above) NORMAL RIGHT VENTRICULAR SYSTOLIC FUNCTION MODERATE VALVULAR REGURGITATION (See above) NO VALVULAR STENOSIS MILD PHTN EF 20%   Neuro/Psych  Headaches, PSYCHIATRIC DISORDERS Anxiety Depression    GI/Hepatic negative GI ROS, Neg liver ROS,   Endo/Other  negative endocrine ROSneg diabetes  Renal/GU negative Renal ROS     Musculoskeletal negative musculoskeletal ROS (+)   Abdominal (+) + obese,   Peds  Hematology negative hematology ROS (+)   Anesthesia Other Findings Past Medical History: No date: AICD (automatic cardioverter/defibrillator) present No date: Anxiety No date: CAD (coronary artery disease) No date: Cardiomyopathy ([place]) No date: CHF (congestive heart failure) (HCC) No date: COPD (chronic obstructive pulmonary disease) (HCC) No date: Depression No date: Dyspnea No date: Headache No date: History of shingles No date: Hypertension No date: Insomnia No date: On home oxygen therapy     Comment:  bedtime and prn No date: Restless leg syndrome   Reproductive/Obstetrics                             Anesthesia Physical Anesthesia Plan  ASA: IV  Anesthesia Plan: General   Post-op Pain Management:    Induction: Intravenous  PONV Risk Score and Plan: 2 and Ondansetron and Dexamethasone  Airway Management Planned: Oral ETT  Additional Equipment:   Intra-op Plan:   Post-operative Plan: Extubation in OR and Possible Post-op intubation/ventilation  Informed Consent: I have reviewed the patients History and Physical, chart, labs and discussed the procedure including the risks, benefits and alternatives for the proposed anesthesia with the patient or authorized representative who has indicated his/her understanding and acceptance.   Dental advisory given  Plan Discussed with: CRNA and Anesthesiologist  Anesthesia Plan Comments: (Discussed possible need for postop ventilation given hx of severe COPD and oxygen dependence BiV ICD, interrogated today, underlying SR ~70 bpm, plan to use magnet during procedure to inhibit sensing function, per [employer] rep function will return to normal when magnet is removed, will not need postop reprogramming)        Anesthesia Quick Evaluation

## 2016-08-23 NOTE — H&P (View-Only) (Signed)
Patient ID: Maria Neal, female   DOB: 1950/08/15, 66 y.o.   MRN: [id number]  CC: Right upper quadrant pain  HPI Maria Neal is a 66 y.o. female who presents to clinic today for evaluation of right upper quadrant pain. Patient reports for the last 3 weeks she's been having intermittent, postprandial right upper quadrant pain. She reports the pain is definitely more intense and worse after eating a fatty or greasy meal. The pain starts about 30 minutes after eating and lasts for 2-3 hours. It has become bad enough where she has sought medical attention twice port. She was seen by [facility] and told that she had biliary colic but the surgeon there did not do surgery secondary to her poor cardiac status. She was seen by our emergency department last week and found to have gallstones without evidence of cholecystitis on imaging. With normal labs. She reports she continues to have intermittent pains but she has been able to eat and tolerate liquids. She denies any fevers, chills, chest pain, shortness breath, diarrhea, constipation. She's had some nausea but denies any vomiting. She is otherwise in her usual state of health. As not seen her cardiologist in the last 6 months.  HPI  Past Medical History:  Diagnosis Date  . Anxiety   . CAD (coronary artery disease)   . Cardiomyopathy ([place])   . CHF (congestive heart failure) ([place])   . COPD (chronic obstructive pulmonary disease) ([place])   . Hypertension   . Insomnia     Past Surgical History:  Procedure Laterality Date  . CARDIAC CATHETERIZATION  04/2014   Dr. Idelle Leech  . CARDIAC CATHETERIZATION    . CARDIAC DEFIBRILLATOR PLACEMENT      Family History  Problem Relation Age of Onset  . Diabetes Mellitus II Mother   . Heart attack Father   . Heart disease Brother   . Arthritis Sister     Social History Social History  Substance Use Topics  . Smoking status: Former Research scientist (life sciences)  . Smokeless tobacco: Never Used  . Alcohol use No     Allergies  Allergen Reactions  . Levaquin [Levofloxacin] Anaphylaxis  . Amoxicillin Hives  . Nitrofuran Derivatives Other (See Comments)    Reaction: unknown  . Penicillins Other (See Comments)    Thinks it made her itch a lot, but isn't sure.  Marland Kitchen Zithromax [Azithromycin] Other (See Comments)  . Antihistamines, Chlorpheniramine-Type Rash    Current Outpatient Prescriptions  Medication Sig Dispense Refill  . acetaminophen (TYLENOL) 500 MG tablet Take 500-1,000 mg by mouth every 8 (eight) hours as needed for mild pain or headache.     . albuterol (PROVENTIL HFA;VENTOLIN HFA) 108 (90 Base) MCG/ACT inhaler 2 puffs q 6 hours 1 Inhaler 0  . aspirin EC 81 MG tablet Take 81 mg by mouth daily.    . carvedilol (COREG) 6.25 MG tablet Take 6.25 mg by mouth 2 (two) times daily.     . cephALEXin (KEFLEX) 500 MG capsule Take 1 capsule (500 mg total) by mouth 3 (three) times daily. 30 capsule 0  . doxycycline (VIBRA-TABS) 100 MG tablet Take 1 tablet (100 mg total) by mouth 2 (two) times daily. 20 tablet 0  . Epigallocatechin Gallate (GREEN TEA) 95 % POWD 1 packet by Does not apply route every other day.     . furosemide (LASIX) 20 MG tablet Take 20 mg by mouth daily.    Marland Kitchen loratadine (CLARITIN) 10 MG tablet Take 1 tablet (10 mg total) by  mouth daily. 7 tablet 0  . losartan (COZAAR) 50 MG tablet Take 1 tablet by mouth daily.    . OXYGEN Inhale 2 L into the lungs daily.    . predniSONE (DELTASONE) 20 MG tablet 3 tabs po qd for 2 days, then 2 tabs po qd for 3 days, then 1 tab po qd for 3 days, then half a tab po qd for 2 days 16 tablet 0  . spironolactone (ALDACTONE) 25 MG tablet Take 25 mg by mouth daily.    . traMADol (ULTRAM) 50 MG tablet Take 1 tablet (50 mg total) by mouth every 6 (six) hours as needed. 15 tablet 0   No current facility-administered medications for this visit.      Review of Systems A Multi-point review of systems was asked and was negative except for the findings documented  in the history of present illness  Physical Exam There were no vitals taken for this visit. CONSTITUTIONAL: No acute distress. EYES: Pupils are equal, round, and reactive to light, Sclera are non-icteric. EARS, NOSE, MOUTH AND THROAT: The oropharynx is clear. The oral mucosa is pink and moist. Hearing is intact to voice. LYMPH NODES:  Lymph nodes in the neck are normal. RESPIRATORY:  Lungs are clear. There is normal respiratory effort, with equal breath sounds bilaterally, and without pathologic use of accessory muscles. CARDIOVASCULAR: Heart is regular without murmurs, gallops, or rubs. GI: The abdomen is large, soft, mildly tender in the right upper quadrant with a negative Murphy sign, and nondistended. There are no palpable masses. There is no hepatosplenomegaly. There are normal bowel sounds in all quadrants. Multiple well-healed prior laparoscopic incision sites from what she says a hiatal hernia repair. GU: Rectal deferred.   MUSCULOSKELETAL: Normal muscle strength and tone. No cyanosis or edema.   SKIN: Turgor is good and there are no pathologic skin lesions or ulcers. NEUROLOGIC: Motor and sensation is grossly normal. Cranial nerves are grossly intact. PSYCH:  Oriented to person, place and time. Affect is normal.  Data Reviewed Images and labs reviewed. Ultrasound shows debris in the fundus of the gallbladder consistent with cholelithiasis versus sludge. All labs with a recent visit to the ER within normal limits. I have personally reviewed the patient's imaging, laboratory findings and medical records.    Assessment    Biliary colic    Plan    66 year old female with biliary colic. I discussed the procedure in detail.  The patient was given Neurosurgeon.  We discussed the risks and benefits of a laparoscopic cholecystectomy and possible cholangiogram including, but not limited to bleeding, infection, injury to surrounding structures such as the intestine or liver, bile  leak, retained gallstones, need to convert to an open procedure, prolonged diarrhea, blood clots such as  DVT, common bile duct injury, anesthesia risks, and possible need for additional procedures.  The likelihood of improvement in symptoms and return to the patient's normal status is good. We discussed the typical post-operative recovery course. Also discussed that the patient is at a much higher risk for surgery due to her cardiac status. She'll require cardiac and anesthesia clearance as prior to being scheduled for surgery. Patient voiced understanding. If we're able to receive the clearance as soon we will schedule surgery otherwise she'll return to clinic after being seen by cardiology and anesthesia.      Time spent with the patient was 45 minutes, with more than 50% of the time spent in face-to-face education, counseling and care coordination.  Clayburn Pert, MD FACS General Surgeon 08/08/2016, 9:44 AM

## 2016-08-23 NOTE — Op Note (Signed)
Laparoscopic Cholecystectomy  Pre-operative Diagnosis: Previous cholecystitis  Post-operative Diagnosis: Same  Procedure: Laparoscopic cholecystectomy  Surgeon: Juanda Crumble T. Adonis Huguenin, MD FACS  Anesthesia: Gen. with endotracheal tube  Assistant: None  Procedure Details  The patient was seen again in the Holding Room. The benefits, complications, treatment options, and expected outcomes were discussed with the patient. The risks of bleeding, infection, recurrence of symptoms, failure to resolve symptoms, bile duct damage, bile duct leak, retained common bile duct stone, bowel injury, any of which could require further surgery and/or ERCP, stent, or papillotomy were reviewed with the patient. The likelihood of improving the patient's symptoms with return to their baseline status is good.  The patient and/or family concurred with the proposed plan, giving informed consent.  The patient was taken to Operating Room, identified as Maria Neal and the procedure verified as Laparoscopic Cholecystectomy.  A Time Out was held and the above information confirmed.  Prior to the induction of general anesthesia, antibiotic prophylaxis was administered. VTE prophylaxis was in place. General endotracheal anesthesia was then administered and tolerated well. After the induction, the abdomen was prepped with Chloraprep and draped in the sterile fashion. The patient was positioned in the supine position.  Local anesthetic was injected into the skin 2 fingerbreadth below the costal margin in the right midclavicular line and an incision made. The Veress needle was placed. Pneumoperitoneum was then created with CO2 and tolerated well without any adverse changes in the patient's vital signs. A 39mm port was placed in the incision and the abdominal cavity was explored. Extensive adhesions to the midline and left upper quadrant were identified. The right upper quadrant was free from adhesions allowing [place] to proceed with a  laparoscopic cholecystectomy.  Two 5-mm ports were placed, with 1 just to the patient's right of midline free of the adhesions, and the additional one further in the right upper quadrant and a 12 mm epigastric port was placed all under direct vision. All skin incisions  were infiltrated with a local anesthetic agent before making the incision and placing the trocars.   The patient was positioned  in reverse Trendelenburg, tilted slightly to the patient's left. Extensive inflammatory adhesions from the omentum were noted to the gallbladder which are taken down using accommodation of blunt dissection and electrocautery. The gallbladder was identified, the fundus grasped and retracted cephalad. Adhesions were lysed bluntly. The infundibulum was grasped and retracted laterally, exposing the peritoneum overlying the triangle of Calot. This was then divided and exposed in a blunt fashion. A critical view of the cystic duct and cystic artery was obtained.  The cystic duct was clearly identified and bluntly dissected.   The cystic duct and artery were surgically clipped with endoclips. They were cut in between endoscopic shears. The gallbladder was taken from the gallbladder fossa in a retrograde fashion with the electrocautery. An additional area of pulsatile bleeding was identified approximately midway up the gallbladder body which was controlled with endoclips as well. The gallbladder was removed and placed in an Endocatch bag. The liver bed was irrigated and inspected. Hemostasis was achieved with the electrocautery. Copious irrigation was utilized and was repeatedly aspirated until clear.  The gallbladder and Endocatch sac were then removed through the epigastric port site.   Inspection of the right upper quadrant was performed. No bleeding, bile duct injury or leak, or bowel injury was noted. Pneumoperitoneum was released.  The epigastric port site was closed with figure-of-eight 0 Vicryl sutures. 4-0  subcuticular Monocryl was used to  close the skin. Steristrips and Mastisol and sterile dressings were  applied.  The patient was then extubated and brought to the recovery room in stable condition. Sponge, lap, and needle counts were correct at closure and at the conclusion of the case.   Findings: Previous Cholecystitis   Estimated Blood Loss: 15 mL         Drains: None         Specimens: Gallbladder           Complications: none               Maria Neal T. Adonis Huguenin, MD, FACS

## 2016-08-23 NOTE — Brief Op Note (Signed)
08/23/2016  11:39 AM  PATIENT:  Maria Neal  66 y.o. female  PRE-OPERATIVE DIAGNOSIS:  RIGHT UPPER QUADRANT PAIN  POST-OPERATIVE DIAGNOSIS:  RIGHT UPPER QUADRANT PAIN  PROCEDURE:  Procedure(s): LAPAROSCOPIC CHOLECYSTECTOMY (N/A)  SURGEON:  Surgeon(s) and Role:    * Clayburn Pert, MD - Primary  PHYSICIAN ASSISTANT:   ASSISTANTS: none   ANESTHESIA:   general  EBL:  No intake/output data recorded.  BLOOD ADMINISTERED:none  DRAINS: none   LOCAL MEDICATIONS USED:  MARCAINE   , XYLOCAINE  and Amount: 33 ml  SPECIMEN:  Source of Specimen:  gallbladder  DISPOSITION OF SPECIMEN:  PATHOLOGY  COUNTS:  YES  TOURNIQUET:  * No tourniquets in log *  DICTATION: .Dragon Dictation  PLAN OF CARE: Discharge to home after PACU  PATIENT DISPOSITION:  PACU - hemodynamically stable.   Delay start of Pharmacological VTE agent (>24hrs) due to surgical blood loss or risk of bleeding: no

## 2016-08-23 NOTE — OR Nursing (Signed)
[employer] rep. Notified by Dr. Randa Lynn that defibrillator needs to be evaluated at changed to non sensing mode for surgery today .

## 2016-08-23 NOTE — Transfer of Care (Signed)
Immediate Anesthesia Transfer of Care Note  Patient: Maria Neal  Procedure(s) Performed: Procedure(s): LAPAROSCOPIC CHOLECYSTECTOMY (N/A)  Patient Location: PACU  Anesthesia Type:General  Level of Consciousness: awake, alert  and oriented  Airway & Oxygen Therapy: Patient Spontanous Breathing and Patient connected to face mask oxygen  Post-op Assessment: Report given to RN and Post -op Vital signs reviewed and stable  Post vital signs: Reviewed and stable  Last Vitals:  Vitals:   08/23/16 1145 08/23/16 1154  BP:  (!) 142/61  Pulse: 71 72  Resp:  20  Temp: 36.5 C 36.5 C  SpO2:  100%    Last Pain:  Vitals:   08/23/16 0840  TempSrc: Tympanic  PainSc: 0-No pain         Complications: No apparent anesthesia complications

## 2016-08-23 NOTE — Anesthesia Procedure Notes (Signed)
Procedure Name: Intubation Date/Time: 08/23/2016 10:34 AM Performed by: Hedda Slade Pre-anesthesia Checklist: Patient identified, Patient being monitored, Timeout performed, Emergency Drugs available and Suction available Patient Re-evaluated:Patient Re-evaluated prior to induction Oxygen Delivery Method: Circle system utilized Preoxygenation: Pre-oxygenation with 100% oxygen Induction Type: IV induction Ventilation: Mask ventilation without difficulty Laryngoscope Size: Mac and 3 Grade View: Grade I Tube type: Oral Tube size: 7.0 mm Number of attempts: 1 Airway Equipment and Method: Stylet Placement Confirmation: ETT inserted through vocal cords under direct vision,  positive ETCO2 and breath sounds checked- equal and bilateral Secured at: 21 cm Tube secured with: Tape Dental Injury: Teeth and Oropharynx as per pre-operative assessment

## 2016-08-23 NOTE — OR Nursing (Signed)
[employer] rep. At bedside to set ICD

## 2016-08-24 ENCOUNTER — Telehealth: Payer: Self-pay

## 2016-08-24 LAB — SURGICAL PATHOLOGY

## 2016-08-24 NOTE — Telephone Encounter (Signed)
Post-op call made to patient at this time. Spoke with Colin Ina. Post-op interview questions below.  1. How are you feeling? Some soreness  2. Is your pain controlled? Yes  3. What are you doing for the pain? Taking pain medication PRN  4. Are you having any Nausea or Vomiting? none  5. Are you having any Fever or Chills? none  6. Are you having any Constipation or Diarrhea? Has yet to have a bowel movement but told her that if she doesn't have one within the next 48-72 hours to try some over the counter Miralax 17g to get her bowels moving.  7. Is there any Swelling or Bruising you are concerned about? none  8. Do you have any questions or concerns at this time? none   Discussion: Reminded patient of her post op appointment on 8/29 with Dr. Adonis Huguenin at 10:15AM. I advised patient that if she needed [place] prior to her post op that she could give our office a call. Patient verbalized understanding.

## 2016-08-30 ENCOUNTER — Ambulatory Visit (INDEPENDENT_AMBULATORY_CARE_PROVIDER_SITE_OTHER): Payer: Medicare Other | Admitting: General Surgery

## 2016-08-30 ENCOUNTER — Encounter: Payer: Self-pay | Admitting: General Surgery

## 2016-08-30 VITALS — BP 116/70 | HR 66 | Temp 98.1°F | Ht 64.0 in | Wt 198.6 lb

## 2016-08-30 DIAGNOSIS — Z4889 Encounter for other specified surgical aftercare: Secondary | ICD-10-CM

## 2016-08-30 NOTE — Progress Notes (Signed)
Outpatient Surgical Follow Up  08/30/2016  Maria Neal is an 66 y.o. female.   Chief Complaint  Patient presents with  . Routine Post Op    Laparoscopic cholecystectomy 08/23/16 Dr. Adonis Huguenin    HPI: 66 year old female now 1 week status post lap scopic cholecystectomy. Patient was doing very well. Not taking any pain medication. Eating well and having normal bowel function. She denies any fevers, chills, nausea, vomiting, chest pain, shortness breath.  Past Medical History:  Diagnosis Date  . AICD (automatic cardioverter/defibrillator) present   . Anxiety   . CAD (coronary artery disease)   . Cardiomyopathy ([place])   . CHF (congestive heart failure) ([place])   . COPD (chronic obstructive pulmonary disease) ([place])   . Depression   . Dyspnea   . Headache   . History of shingles   . Hypertension   . Insomnia   . On home oxygen therapy    bedtime and prn  . Restless leg syndrome     Past Surgical History:  Procedure Laterality Date  . CARDIAC CATHETERIZATION  04/2014   Dr. Idelle Leech  . CARDIAC CATHETERIZATION    . CARDIAC DEFIBRILLATOR PLACEMENT    . CHOLECYSTECTOMY N/A 08/23/2016   Procedure: LAPAROSCOPIC CHOLECYSTECTOMY;  Surgeon: Clayburn Pert, MD;  Location: [facility] ORS;  Service: General;  Laterality: N/A;  . HERNIA REPAIR      Family History  Problem Relation Age of Onset  . Diabetes Mellitus II Mother   . Heart attack Father   . Heart disease Brother   . Arthritis Sister     Social History:  reports that she has quit smoking. Her smoking use included Cigarettes. She smoked 0.25 packs per day. She has never used smokeless tobacco. She reports that she does not drink alcohol or use drugs.  Allergies:  Allergies  Allergen Reactions  . Levaquin [Levofloxacin] Anaphylaxis  . Amoxicillin Hives  . Nitrofuran Derivatives Other (See Comments)    Reaction: unknown  . Penicillins Other (See Comments)    Thinks it made her itch a lot, but isn't sure.  Marland Kitchen Zithromax  [Azithromycin] Other (See Comments)  . Antihistamines, Chlorpheniramine-Type Rash    Medications reviewed.    ROS A multipoint review of system was complete, all pertinent positives and negatives are documented in the history of present illness the remainder negative   BP 116/70   Pulse 66   Temp 98.1 F (36.7 C) (Oral)   Ht 5\' 4"  (1.626 m)   Wt 90.1 kg (198 lb 9.6 oz)   BMI 34.09 kg/m   Physical Exam Gen.: No acute distress  chest: Clear to auscultation Heart: Regular rate and rhythm Abdomen: Soft, nontender, nondistended. Well approximated laparoscopic incision sites on the evidence of erythema or drainage.    No results found for this or any previous visit (from the past 48 hour(s)). No results found.  Assessment/Plan:  1. Aftercare following surgery 66 year old female status post lap scopic cholecystectomy. Doing very well. Reviewed the pathology with her in detail. She voiced understanding will follow-up in clinic on an as-needed basis.     Clayburn Pert, MD FACS General Surgeon  08/30/2016,12:35 PM

## 2016-08-30 NOTE — Patient Instructions (Addendum)
GENERAL POST-OPERATIVE PATIENT INSTRUCTIONS   WOUND CARE INSTRUCTIONS:  If clothing rubs against the wound or causes irritation and the wound is not draining you may cover it with a dry dressing during the daytime.  Try to keep the wound dry and avoid ointments on the wound unless directed to do so.  If the wound becomes bright red and painful or starts to drain infected material that is not clear, please contact your physician immediately.  If the wound is mildly pink and has a thick firm ridge underneath it, this is normal, and is referred to as a healing ridge.  This will resolve over the next 4-6 weeks.  BATHING: You may shower if you have been informed of this by your surgeon. However, It is okay to submerge in a tub, hot tub, or pool now that incisions are completely sealed.Marland Kitchen  DIET:  You may eat any foods that you can tolerate.  It is a good idea to eat a high fiber diet and take in plenty of fluids to prevent constipation.  If you do become constipated you may want to take a mild laxative or take ducolax tablets on a daily basis until your bowel habits are regular.  Constipation can be very uncomfortable, along with straining, after recent surgery.  ACTIVITY:  You are encouraged to cough and deep breath or use your incentive spirometer if you were given one, every 15-30 minutes when awake.  This will help prevent respiratory complications and low grade fevers post-operatively if you had a general anesthetic.  You may want to hug a pillow when coughing and sneezing to add additional support to the surgical area, if you had abdominal or chest surgery, which will decrease pain during these times.  You are encouraged to walk and engage in light activity for the next two weeks.  You should not lift more than 20 pounds, until 10/04/2016 as it could put you at increased risk for complications.  Twenty pounds is roughly equivalent to a plastic bag of groceries. At that time- Listen to your body when  lifting, if you have pain when lifting, stop and then try again in a few days. Soreness after doing exercises or activities of daily living is normal as you get back in to your normal routine.  MEDICATIONS:  Try to take narcotic medications and anti-inflammatory medications, such as tylenol, ibuprofen, naprosyn, etc., with food.  This will minimize stomach upset from the medication.  Should you develop nausea and vomiting from the pain medication, or develop a rash, please discontinue the medication and contact your physician.  You should not drive, make important decisions, or operate machinery when taking narcotic pain medication.  SUNBLOCK Use sun block to incision area over the next year if this area will be exposed to sun. This helps decrease scarring and will allow you avoid a permanent darkened area over your incision.  QUESTIONS:  Please feel free to call our office if you have any questions, and we will be glad to assist you. [phone]

## 2016-09-06 DIAGNOSIS — I447 Left bundle-branch block, unspecified: Secondary | ICD-10-CM | POA: Diagnosis not present

## 2016-09-22 ENCOUNTER — Ambulatory Visit
Admission: EM | Admit: 2016-09-22 | Discharge: 2016-09-22 | Disposition: A | Discharge status: Home / Self Care | Payer: Medicare Other | Attending: Family Medicine | Admitting: Family Medicine

## 2016-09-22 ENCOUNTER — Encounter: Payer: Self-pay | Admitting: Emergency Medicine

## 2016-09-22 DIAGNOSIS — Z7982 Long term (current) use of aspirin: Secondary | ICD-10-CM | POA: Diagnosis not present

## 2016-09-22 DIAGNOSIS — G2581 Restless legs syndrome: Secondary | ICD-10-CM | POA: Diagnosis not present

## 2016-09-22 DIAGNOSIS — Z888 Allergy status to other drugs, medicaments and biological substances status: Secondary | ICD-10-CM | POA: Insufficient documentation

## 2016-09-22 DIAGNOSIS — Z880 Allergy status to penicillin: Secondary | ICD-10-CM | POA: Insufficient documentation

## 2016-09-22 DIAGNOSIS — I5023 Acute on chronic systolic (congestive) heart failure: Secondary | ICD-10-CM | POA: Insufficient documentation

## 2016-09-22 DIAGNOSIS — Z8619 Personal history of other infectious and parasitic diseases: Secondary | ICD-10-CM | POA: Diagnosis not present

## 2016-09-22 DIAGNOSIS — Z9981 Dependence on supplemental oxygen: Secondary | ICD-10-CM | POA: Diagnosis not present

## 2016-09-22 DIAGNOSIS — J449 Chronic obstructive pulmonary disease, unspecified: Secondary | ICD-10-CM | POA: Insufficient documentation

## 2016-09-22 DIAGNOSIS — Z9889 Other specified postprocedural states: Secondary | ICD-10-CM | POA: Diagnosis not present

## 2016-09-22 DIAGNOSIS — Z8261 Family history of arthritis: Secondary | ICD-10-CM | POA: Diagnosis not present

## 2016-09-22 DIAGNOSIS — F329 Major depressive disorder, single episode, unspecified: Secondary | ICD-10-CM | POA: Insufficient documentation

## 2016-09-22 DIAGNOSIS — Z8249 Family history of ischemic heart disease and other diseases of the circulatory system: Secondary | ICD-10-CM | POA: Insufficient documentation

## 2016-09-22 DIAGNOSIS — B349 Viral infection, unspecified: Secondary | ICD-10-CM

## 2016-09-22 DIAGNOSIS — Z7952 Long term (current) use of systemic steroids: Secondary | ICD-10-CM | POA: Diagnosis not present

## 2016-09-22 DIAGNOSIS — Z79891 Long term (current) use of opiate analgesic: Secondary | ICD-10-CM | POA: Insufficient documentation

## 2016-09-22 DIAGNOSIS — Z833 Family history of diabetes mellitus: Secondary | ICD-10-CM | POA: Insufficient documentation

## 2016-09-22 DIAGNOSIS — J029 Acute pharyngitis, unspecified: Secondary | ICD-10-CM | POA: Insufficient documentation

## 2016-09-22 DIAGNOSIS — Z95810 Presence of automatic (implantable) cardiac defibrillator: Secondary | ICD-10-CM | POA: Insufficient documentation

## 2016-09-22 DIAGNOSIS — I429 Cardiomyopathy, unspecified: Secondary | ICD-10-CM | POA: Diagnosis not present

## 2016-09-22 DIAGNOSIS — Z87891 Personal history of nicotine dependence: Secondary | ICD-10-CM | POA: Insufficient documentation

## 2016-09-22 DIAGNOSIS — I2510 Atherosclerotic heart disease of native coronary artery without angina pectoris: Secondary | ICD-10-CM | POA: Insufficient documentation

## 2016-09-22 DIAGNOSIS — F419 Anxiety disorder, unspecified: Secondary | ICD-10-CM | POA: Diagnosis not present

## 2016-09-22 DIAGNOSIS — Z79899 Other long term (current) drug therapy: Secondary | ICD-10-CM | POA: Diagnosis not present

## 2016-09-22 DIAGNOSIS — Z9049 Acquired absence of other specified parts of digestive tract: Secondary | ICD-10-CM | POA: Insufficient documentation

## 2016-09-22 LAB — RAPID STREP SCREEN (MED CTR MEBANE ONLY): Streptococcus, Group A Screen (Direct): NEGATIVE

## 2016-09-22 MED ORDER — LIDOCAINE VISCOUS 2 % MT SOLN: OROMUCOSAL | 0 refills | Status: DC

## 2016-09-22 NOTE — ED Provider Notes (Signed)
[facility]    CSN: [id number] Arrival date & time: 09/22/16  1129     History   Chief Complaint Chief Complaint  Patient presents with  . Sore Throat    HPI Maria Neal is a 66 y.o. female.   The history is provided by the patient.  Sore Throat  This is a new problem. The current episode started 2 days ago. The problem occurs constantly. The problem has not changed since onset.Pertinent negatives include no chest pain, no abdominal pain, no headaches and no shortness of breath. The symptoms are aggravated by swallowing. She has tried acetaminophen for the symptoms. The treatment provided mild relief.    Past Medical History:  Diagnosis Date  . AICD (automatic cardioverter/defibrillator) present   . Anxiety   . CAD (coronary artery disease)   . Cardiomyopathy ([place])   . CHF (congestive heart failure) ([place])   . COPD (chronic obstructive pulmonary disease) ([place])   . Depression   . Dyspnea   . Headache   . History of shingles   . Hypertension   . Insomnia   . On home oxygen therapy    bedtime and prn  . Restless leg syndrome     Patient Active Problem List   Diagnosis Date Noted  . Biliary colic 23/76/2831  . Chronic systolic congestive heart failure, NYHA class 3 ([place]) 08/07/2016  . NICM (nonischemic cardiomyopathy) ([place]) 08/07/2016  . Bundle branch block, left 10/02/2014  . CAP (community acquired pneumonia) 05/07/2014  . Benign essential HTN 05/07/2014  . CAD (coronary artery disease) 05/07/2014  . COPD (chronic obstructive pulmonary disease) ([place]) 05/07/2014  . S/P cardiac catheterization 04/23/2014  . Calculus of gallbladder with cholecystitis without biliary obstruction 03/31/2014  . Acute on chronic systolic heart failure ([place]) 02/03/2014  . Candidiasis of mouth 02/03/2014  . SOB (shortness of breath) on exertion 11/06/2013  . Dermatitis, unspecified 10/08/2013  . Insomnia 10/14/2010  . Edema 05/20/2010  . Anxiety state 03/04/2010  .  Coronary artery disease 03/04/2010    Past Surgical History:  Procedure Laterality Date  . CARDIAC CATHETERIZATION  04/2014   Dr. Idelle Leech  . CARDIAC CATHETERIZATION    . CARDIAC DEFIBRILLATOR PLACEMENT    . CHOLECYSTECTOMY N/A 08/23/2016   Procedure: LAPAROSCOPIC CHOLECYSTECTOMY;  Surgeon: Clayburn Pert, MD;  Location: [facility] ORS;  Service: General;  Laterality: N/A;  . HERNIA REPAIR      OB History    No data available       Home Medications    Prior to Admission medications   Medication Sig Start Date End Date Taking? Authorizing Provider  acetaminophen (TYLENOL) 500 MG tablet Take 500-1,000 mg by mouth every 8 (eight) hours as needed for mild pain or headache.    Yes [provider]  albuterol (PROVENTIL HFA;VENTOLIN HFA) 108 (90 Base) MCG/ACT inhaler 2 puffs q 6 hours Patient taking differently: Inhale 2 puffs into the lungs every 4 (four) hours as needed. 2 puffs q 6 hours 01/17/16  Yes Mariadelcarmen Corella, Linward Foster, MD  aspirin EC 81 MG tablet Take 81 mg by mouth daily.   Yes [provider]  carvedilol (COREG) 6.25 MG tablet Take 6.25 mg by mouth 2 (two) times daily.    Yes [provider]  furosemide (LASIX) 20 MG tablet Take 20 mg by mouth daily.   Yes [provider]  losartan (COZAAR) 50 MG tablet Take 1 tablet by mouth daily. 08/24/14  Yes [provider]  OXYGEN Inhale 2 L into  the lungs daily.   Yes [provider]  HYDROcodone-acetaminophen (NORCO/VICODIN) 5-325 MG tablet Take 1 tablet by mouth every 4 (four) hours as needed for moderate pain. 08/23/16 08/23/17  Clayburn Pert, MD  lidocaine (XYLOCAINE) 2 % solution 20 ml gargle and spit q 6 hours prn sore throat 09/22/16   Norval Gable, MD  loratadine (CLARITIN) 10 MG tablet Take 1 tablet (10 mg total) by mouth daily. 11/12/14   Marylene Land, NP  predniSONE (DELTASONE) 20 MG tablet 3 tabs po qd for 2 days, then 2 tabs po qd for 3 days, then 1 tab po qd for 3 days, then half  a tab po qd for 2 days 01/17/16   Norval Gable, MD  spironolactone (ALDACTONE) 25 MG tablet Take 25 mg by mouth daily.    [provider]  traMADol (ULTRAM) 50 MG tablet Take 1 tablet (50 mg total) by mouth every 6 (six) hours as needed. 08/01/16 08/01/17  Nance Pear, MD    Family History Family History  Problem Relation Age of Onset  . Diabetes Mellitus II Mother   . Heart attack Father   . Heart disease Brother   . Arthritis Sister     Social History Social History  Substance Use Topics  . Smoking status: Former Smoker    Packs/day: 0.25    Types: Cigarettes  . Smokeless tobacco: Never Used  . Alcohol use No     Allergies   Levaquin [levofloxacin]; Amoxicillin; Nitrofuran derivatives; Penicillins; Zithromax [azithromycin]; and Antihistamines, chlorpheniramine-type   Review of Systems Review of Systems  Respiratory: Negative for shortness of breath.   Cardiovascular: Negative for chest pain.  Gastrointestinal: Negative for abdominal pain.  Neurological: Negative for headaches.     Physical Exam Triage Vital Signs ED Triage Vitals  Enc Vitals Group     BP 09/22/16 1157 (!) 118/52     Pulse Rate 09/22/16 1157 81     Resp 09/22/16 1157 16     Temp 09/22/16 1157 98.5 F (36.9 C)     Temp Source 09/22/16 1157 Oral     SpO2 09/22/16 1157 97 %     Weight 09/22/16 1200 195 lb (88.5 kg)     Height 09/22/16 1200 5\' 4"  (1.626 m)     Head Circumference --      Peak Flow --      Pain Score 09/22/16 1201 6     Pain Loc --      Pain Edu? --      Excl. in [place]? --    No data found.   Updated Vital Signs BP (!) 118/52 (BP Location: Left Arm)   Pulse 81   Temp 98.5 F (36.9 C) (Oral)   Resp 16   Ht 5\' 4"  (1.626 m)   Wt 195 lb (88.5 kg)   SpO2 97%   BMI 33.47 kg/m   Visual Acuity Right Eye Distance:   Left Eye Distance:   Bilateral Distance:    Right Eye Near:   Left Eye Near:    Bilateral Near:     Physical Exam  Constitutional: She  appears well-developed and well-nourished. No distress.  HENT:  Head: Normocephalic and atraumatic.  Right Ear: Tympanic membrane normal.  Left Ear: Tympanic membrane normal.  Mouth/Throat: Posterior oropharyngeal erythema present. No oropharyngeal exudate, posterior oropharyngeal edema or tonsillar abscesses. No tonsillar exudate.  Neck: No tracheal deviation present. No thyromegaly present.  Pulmonary/Chest: No stridor.  Lymphadenopathy:    She has no cervical  adenopathy.  Skin: She is not diaphoretic.  Vitals reviewed.    UC Treatments / Results  Labs (all labs ordered are listed, but only abnormal results are displayed) Labs Reviewed  RAPID STREP SCREEN (NOT AT [facility])  CULTURE, GROUP A STREP [facility])    EKG  EKG Interpretation None       Radiology No results found.  Procedures Procedures (including critical care time)  Medications Ordered in UC Medications - No data to display   Initial Impression / Assessment and Plan / UC Course  I have reviewed the triage vital signs and the nursing notes.  Pertinent labs & imaging results that were available during my care of the patient were reviewed by me and considered in my medical decision making (see chart for details).       Final Clinical Impressions(s) / UC Diagnoses   Final diagnoses:  Viral pharyngitis    New Prescriptions New Prescriptions   LIDOCAINE (XYLOCAINE) 2 % SOLUTION    20 ml gargle and spit q 6 hours prn sore throat   1. Lab result and diagnosis reviewed with patient 2. rx as per orders above; reviewed possible side effects, interactions, risks and benefits  3. Recommend supportive treatment with otc analgesics prn 4. Follow-up prn if symptoms worsen or don't improve  Controlled Substance Prescriptions [place] Controlled Substance Registry consulted? Not Applicable   Norval Gable, MD 09/22/16 1236

## 2016-09-22 NOTE — ED Triage Notes (Signed)
Patient in today c/o sore throat x 2 days. Patient states left side of throat is the worst and painful with swallowing. Patient has tried Tylenol and warm salt water gargles.

## 2016-09-25 LAB — CULTURE, GROUP A STREP (THRC)

## 2017-02-08 ENCOUNTER — Ambulatory Visit
Admission: EM | Admit: 2017-02-08 | Discharge: 2017-02-08 | Disposition: A | Discharge status: Home / Self Care | Payer: Medicare Other | Attending: Emergency Medicine | Admitting: Emergency Medicine

## 2017-02-08 ENCOUNTER — Encounter: Payer: Self-pay | Admitting: *Deleted

## 2017-02-08 DIAGNOSIS — B9789 Other viral agents as the cause of diseases classified elsewhere: Secondary | ICD-10-CM

## 2017-02-08 DIAGNOSIS — Z880 Allergy status to penicillin: Secondary | ICD-10-CM | POA: Diagnosis not present

## 2017-02-08 DIAGNOSIS — R05 Cough: Secondary | ICD-10-CM | POA: Diagnosis not present

## 2017-02-08 DIAGNOSIS — I110 Hypertensive heart disease with heart failure: Secondary | ICD-10-CM | POA: Insufficient documentation

## 2017-02-08 DIAGNOSIS — Z881 Allergy status to other antibiotic agents status: Secondary | ICD-10-CM | POA: Insufficient documentation

## 2017-02-08 DIAGNOSIS — I428 Other cardiomyopathies: Secondary | ICD-10-CM | POA: Insufficient documentation

## 2017-02-08 DIAGNOSIS — G2581 Restless legs syndrome: Secondary | ICD-10-CM | POA: Diagnosis not present

## 2017-02-08 DIAGNOSIS — I5022 Chronic systolic (congestive) heart failure: Secondary | ICD-10-CM | POA: Insufficient documentation

## 2017-02-08 DIAGNOSIS — Z9049 Acquired absence of other specified parts of digestive tract: Secondary | ICD-10-CM | POA: Diagnosis not present

## 2017-02-08 DIAGNOSIS — Z87891 Personal history of nicotine dependence: Secondary | ICD-10-CM | POA: Insufficient documentation

## 2017-02-08 DIAGNOSIS — Z9981 Dependence on supplemental oxygen: Secondary | ICD-10-CM | POA: Insufficient documentation

## 2017-02-08 DIAGNOSIS — J069 Acute upper respiratory infection, unspecified: Secondary | ICD-10-CM | POA: Diagnosis not present

## 2017-02-08 DIAGNOSIS — F329 Major depressive disorder, single episode, unspecified: Secondary | ICD-10-CM | POA: Diagnosis not present

## 2017-02-08 DIAGNOSIS — Z95810 Presence of automatic (implantable) cardiac defibrillator: Secondary | ICD-10-CM | POA: Insufficient documentation

## 2017-02-08 DIAGNOSIS — Z79899 Other long term (current) drug therapy: Secondary | ICD-10-CM | POA: Insufficient documentation

## 2017-02-08 DIAGNOSIS — F419 Anxiety disorder, unspecified: Secondary | ICD-10-CM | POA: Insufficient documentation

## 2017-02-08 DIAGNOSIS — J029 Acute pharyngitis, unspecified: Secondary | ICD-10-CM | POA: Diagnosis present

## 2017-02-08 DIAGNOSIS — Z7982 Long term (current) use of aspirin: Secondary | ICD-10-CM | POA: Diagnosis not present

## 2017-02-08 DIAGNOSIS — I2510 Atherosclerotic heart disease of native coronary artery without angina pectoris: Secondary | ICD-10-CM | POA: Insufficient documentation

## 2017-02-08 DIAGNOSIS — J449 Chronic obstructive pulmonary disease, unspecified: Secondary | ICD-10-CM | POA: Insufficient documentation

## 2017-02-08 LAB — RAPID INFLUENZA A&B ANTIGENS
Influenza A (ARMC): NEGATIVE
Influenza B (ARMC): NEGATIVE

## 2017-02-08 LAB — RAPID STREP SCREEN (MED CTR MEBANE ONLY): STREPTOCOCCUS, GROUP A SCREEN (DIRECT): NEGATIVE

## 2017-02-08 NOTE — Discharge Instructions (Signed)
Your flu and strep test are negative, rest,push fluids. Follow up with PCP next week. If you develop worsening symptoms(shortness of breath, chest pain,etc). Return to UC as needed.

## 2017-02-08 NOTE — ED Provider Notes (Signed)
[facility]    CSN: [id number] Arrival date & time: 02/08/17  1558     History   Chief Complaint Chief Complaint  Patient presents with  . Sore Throat    HPI Maria Neal is a 67 y.o. female.   The history is provided by the patient. No language interpreter was used.  URI  Presenting symptoms: congestion, cough, fever and sore throat   Presenting symptoms: no ear pain and no rhinorrhea   Severity:  Mild Onset quality:  Sudden Duration:  2 days Timing:  Constant Progression:  Unchanged Chronicity:  New Relieved by:  OTC medications and rest Worsened by:  Nothing Associated symptoms: sneezing   Associated symptoms: no headaches and no myalgias   Risk factors: being elderly and sick contacts     Past Medical History:  Diagnosis Date  . AICD (automatic cardioverter/defibrillator) present   . Anxiety   . CAD (coronary artery disease)   . Cardiomyopathy ([place])   . CHF (congestive heart failure) ([place])   . COPD (chronic obstructive pulmonary disease) ([place])   . Depression   . Dyspnea   . Headache   . History of shingles   . Hypertension   . Insomnia   . On home oxygen therapy    bedtime and prn  . Restless leg syndrome     Patient Active Problem List   Diagnosis Date Noted  . Viral URI with cough 02/08/2017  . Biliary colic 71/24/5809  . Chronic systolic congestive heart failure, NYHA class 3 ([place]) 08/07/2016  . NICM (nonischemic cardiomyopathy) ([place]) 08/07/2016  . Bundle branch block, left 10/02/2014  . CAP (community acquired pneumonia) 05/07/2014  . Benign essential HTN 05/07/2014  . CAD (coronary artery disease) 05/07/2014  . COPD (chronic obstructive pulmonary disease) ([place]) 05/07/2014  . S/P cardiac catheterization 04/23/2014  . Calculus of gallbladder with cholecystitis without biliary obstruction 03/31/2014  . Acute on chronic systolic heart failure ([place]) 02/03/2014  . Candidiasis of mouth 02/03/2014  . SOB (shortness of breath) on  exertion 11/06/2013  . Dermatitis, unspecified 10/08/2013  . Insomnia 10/14/2010  . Edema 05/20/2010  . Anxiety state 03/04/2010  . Coronary artery disease 03/04/2010    Past Surgical History:  Procedure Laterality Date  . CARDIAC CATHETERIZATION  04/2014   Dr. Idelle Leech  . CARDIAC CATHETERIZATION    . CARDIAC DEFIBRILLATOR PLACEMENT    . CHOLECYSTECTOMY N/A 08/23/2016   Procedure: LAPAROSCOPIC CHOLECYSTECTOMY;  Surgeon: Clayburn Pert, MD;  Location: [facility] ORS;  Service: General;  Laterality: N/A;  . HERNIA REPAIR      OB History    No data available       Home Medications    Prior to Admission medications   Medication Sig Start Date End Date Taking? Authorizing Provider  albuterol (PROVENTIL HFA;VENTOLIN HFA) 108 (90 Base) MCG/ACT inhaler 2 puffs q 6 hours Patient taking differently: Inhale 2 puffs into the lungs every 4 (four) hours as needed. 2 puffs q 6 hours 01/17/16  Yes Conty, Linward Foster, MD  aspirin EC 81 MG tablet Take 81 mg by mouth daily.   Yes [provider]  carvedilol (COREG) 6.25 MG tablet Take 6.25 mg by mouth 2 (two) times daily.    Yes [provider]  furosemide (LASIX) 20 MG tablet Take 20 mg by mouth daily.   Yes [provider]  losartan (COZAAR) 50 MG tablet Take 1 tablet by mouth daily. 08/24/14  Yes [provider]  OXYGEN Inhale 2 L into the  lungs daily.   Yes [provider]  spironolactone (ALDACTONE) 25 MG tablet Take 25 mg by mouth daily.   Yes [provider]  acetaminophen (TYLENOL) 500 MG tablet Take 500-1,000 mg by mouth every 8 (eight) hours as needed for mild pain or headache.     [provider]  HYDROcodone-acetaminophen (NORCO/VICODIN) 5-325 MG tablet Take 1 tablet by mouth every 4 (four) hours as needed for moderate pain. 08/23/16 08/23/17  Clayburn Pert, MD  lidocaine (XYLOCAINE) 2 % solution 20 ml gargle and spit q 6 hours prn sore throat 09/22/16   Norval Gable, MD    loratadine (CLARITIN) 10 MG tablet Take 1 tablet (10 mg total) by mouth daily. 11/12/14   Marylene Land, NP  predniSONE (DELTASONE) 20 MG tablet 3 tabs po qd for 2 days, then 2 tabs po qd for 3 days, then 1 tab po qd for 3 days, then half a tab po qd for 2 days 01/17/16   Norval Gable, MD  traMADol (ULTRAM) 50 MG tablet Take 1 tablet (50 mg total) by mouth every 6 (six) hours as needed. 08/01/16 08/01/17  Nance Pear, MD    Family History Family History  Problem Relation Age of Onset  . Diabetes Mellitus II Mother   . Heart attack Father   . Heart disease Brother   . Arthritis Sister     Social History Social History   Tobacco Use  . Smoking status: Former Smoker    Packs/day: 0.25    Types: Cigarettes  . Smokeless tobacco: Never Used  Substance Use Topics  . Alcohol use: No    Alcohol/week: 0.0 oz  . Drug use: No     Allergies   Levaquin [levofloxacin]; Amoxicillin; Nitrofuran derivatives; Penicillins; Zithromax [azithromycin]; and Antihistamines, chlorpheniramine-type   Review of Systems Review of Systems  Constitutional: Positive for fever. Negative for chills.  HENT: Positive for congestion, sneezing and sore throat. Negative for ear pain and rhinorrhea.   Eyes: Negative.   Respiratory: Positive for cough.   Gastrointestinal: Negative for nausea and vomiting.  Endocrine: Negative.   Genitourinary: Negative for dysuria.  Musculoskeletal: Negative for myalgias.  Skin: Negative for rash.  Allergic/Immunologic: Negative.   Neurological: Negative for headaches.  Hematological: Negative.   Psychiatric/Behavioral: Negative.   All other systems reviewed and are negative.    Physical Exam Triage Vital Signs ED Triage Vitals  Enc Vitals Group     BP 02/08/17 1613 (!) 136/58     Pulse Rate 02/08/17 1613 79     Resp 02/08/17 1613 16     Temp 02/08/17 1613 98.6 F (37 C)     Temp Source 02/08/17 1613 Oral     SpO2 02/08/17 1613 96 %     Weight 02/08/17  1615 170 lb (77.1 kg)     Height 02/08/17 1615 5\' 6"  (1.676 m)     Head Circumference --      Peak Flow --      Pain Score 02/08/17 1615 7     Pain Loc --      Pain Edu? --      Excl. in [place]? --    No data found.  Updated Vital Signs BP (!) 136/58 (BP Location: Left Arm)   Pulse 79   Temp 98.6 F (37 C) (Oral)   Resp 16   Ht 5\' 6"  (1.676 m)   Wt 170 lb (77.1 kg)   SpO2 96%   BMI 27.44 kg/m   Visual Acuity  Physical Exam  Constitutional: She is oriented to person, place, and time. She appears well-developed and well-nourished. She is active and cooperative. No distress.  HENT:  Head: Normocephalic.  Right Ear: Tympanic membrane is retracted.  Left Ear: Tympanic membrane is retracted.  Nose: Mucosal edema present.  Mouth/Throat: Uvula is midline and mucous membranes are normal. Posterior oropharyngeal erythema present. No posterior oropharyngeal edema or tonsillar abscesses.  Eyes: Conjunctivae, EOM and lids are normal. Pupils are equal, round, and reactive to light.  Neck: Normal range of motion. No tracheal deviation present.  Cardiovascular: Regular rhythm, normal heart sounds and normal pulses.  No murmur heard. Pulmonary/Chest: Effort normal and breath sounds normal.  Abdominal: Soft. Bowel sounds are normal. There is no tenderness.  Musculoskeletal: Normal range of motion.  Lymphadenopathy:    She has no cervical adenopathy.  Neurological: She is alert and oriented to person, place, and time. GCS eye subscore is 4. GCS verbal subscore is 5. GCS motor subscore is 6.  Skin: Skin is warm and dry. No rash noted.  Psychiatric: She has a normal mood and affect. Her speech is normal and behavior is normal.  Nursing note and vitals reviewed.    UC Treatments / Results  Labs (all labs ordered are listed, but only abnormal results are displayed) Labs Reviewed  RAPID STREP SCREEN (NOT AT [facility])  RAPID INFLUENZA A&B ANTIGENS ([facility] ONLY)  CULTURE, GROUP A STREP [facility])     EKG  EKG Interpretation None       Radiology No results found.  Procedures Procedures (including critical care time)  Medications Ordered in UC Medications - No data to display   Initial Impression / Assessment and Plan / UC Course  I have reviewed the triage vital signs and the nursing notes.  Pertinent labs & imaging results that were available during my care of the patient were reviewed by me and considered in my medical decision making (see chart for details).    Your flu and strep test are negative, rest,push fluids. Follow up with PCP next week. If you develop worsening symptoms(shortness of breath, chest pain,etc). Return to UC as needed. Pt verbalized understanding to this provider.    Final Clinical Impressions(s) / UC Diagnoses   Final diagnoses:  Viral URI with cough    ED Discharge Orders    None       Controlled Substance Prescriptions    Nazareth Kirk, Jeanett Schlein, NP 53/74/82 [id number]

## 2017-02-08 NOTE — ED Triage Notes (Signed)
C/O sore throat, cough and nose congestion x2 days. Denies fever.

## 2017-02-11 LAB — CULTURE, GROUP A STREP (THRC)

## 2017-02-14 ENCOUNTER — Ambulatory Visit: Payer: Medicare Other | Admitting: Unknown Physician Specialty

## 2017-02-15 ENCOUNTER — Telehealth: Payer: Self-pay | Admitting: Unknown Physician Specialty

## 2017-02-15 NOTE — Telephone Encounter (Signed)
Routing to front office. Has anyone seen her records and does [facility] have opening for NP appointment sooner?

## 2017-02-15 NOTE — Telephone Encounter (Signed)
Copied from [place]. Topic: Quick Communication - See Telephone Encounter >> Feb 15, 2017  9:40 AM Cleaster Corin, NT wrote: CRM for notification. See Telephone encounter for:   02/15/17. Pt. Daughter calling to see if pt. Can come in to see Dr. Julian Hy before (new pt. Appt.) pt. Was around grand kids who had the flu went to urgent care last Thursday but was told she didn't have flu. Pt. Still isn't felling well and coughing but cant seem to cough anything up. She also wants to to know if records have been sent from previous pcp. Pt. Can be reached at [phone]

## 2017-02-28 ENCOUNTER — Ambulatory Visit: Payer: Medicare Other | Admitting: Unknown Physician Specialty

## 2017-03-08 ENCOUNTER — Encounter: Payer: Self-pay | Admitting: *Deleted

## 2017-03-08 ENCOUNTER — Other Ambulatory Visit: Payer: Self-pay

## 2017-03-08 ENCOUNTER — Ambulatory Visit
Admission: EM | Admit: 2017-03-08 | Discharge: 2017-03-08 | Disposition: A | Discharge status: Home / Self Care | Payer: Medicare Other | Attending: Family Medicine | Admitting: Family Medicine

## 2017-03-08 ENCOUNTER — Ambulatory Visit (INDEPENDENT_AMBULATORY_CARE_PROVIDER_SITE_OTHER): Payer: Medicare Other

## 2017-03-08 DIAGNOSIS — W010XXA Fall on same level from slipping, tripping and stumbling without subsequent striking against object, initial encounter: Secondary | ICD-10-CM

## 2017-03-08 DIAGNOSIS — S82831A Other fracture of upper and lower end of right fibula, initial encounter for closed fracture: Secondary | ICD-10-CM | POA: Diagnosis not present

## 2017-03-08 DIAGNOSIS — S8264XA Nondisplaced fracture of lateral malleolus of right fibula, initial encounter for closed fracture: Secondary | ICD-10-CM | POA: Diagnosis not present

## 2017-03-08 DIAGNOSIS — M25571 Pain in right ankle and joints of right foot: Secondary | ICD-10-CM

## 2017-03-08 DIAGNOSIS — S8263XA Displaced fracture of lateral malleolus of unspecified fibula, initial encounter for closed fracture: Secondary | ICD-10-CM | POA: Insufficient documentation

## 2017-03-08 MED ORDER — HYDROCODONE-ACETAMINOPHEN 5-325 MG PO TABS: 1.0000 | ORAL_TABLET | Freq: Three times a day (TID) | ORAL | 0 refills | Status: DC | PRN

## 2017-03-08 NOTE — ED Triage Notes (Addendum)
Patient injured her right ankle today when she slipped on a mat. No previous history of right ankle injuries.

## 2017-03-08 NOTE — ED Provider Notes (Signed)
[facility]    CSN: [id number] Arrival date & time: 03/08/17  0850  History   Chief Complaint Chief Complaint  Patient presents with  . Ankle Pain   HPI  67 year old female presents with ankle pain following a fall.  Patient states that she slipped on a mat today on the front porch.  She fell and injured her right ankle.  Pain is severe.  She reports associated swelling and bruising of the lateral ankle.  Worse with range of motion/activity.  No relieving factors.  No other associated symptoms.  No other complaints at this time.  Past Medical History:  Diagnosis Date  . AICD (automatic cardioverter/defibrillator) present   . Anxiety   . CAD (coronary artery disease)   . Cardiomyopathy ([place])   . CHF (congestive heart failure) ([place])   . COPD (chronic obstructive pulmonary disease) ([place])   . Depression   . Dyspnea   . Headache   . History of shingles   . Hypertension   . Insomnia   . On home oxygen therapy    bedtime and prn  . Restless leg syndrome     Patient Active Problem List   Diagnosis Date Noted  . Viral URI with cough 02/08/2017  . Biliary colic 61/60/7371  . Chronic systolic congestive heart failure, NYHA class 3 ([place]) 08/07/2016  . NICM (nonischemic cardiomyopathy) ([place]) 08/07/2016  . Bundle branch block, left 10/02/2014  . CAP (community acquired pneumonia) 05/07/2014  . Benign essential HTN 05/07/2014  . CAD (coronary artery disease) 05/07/2014  . COPD (chronic obstructive pulmonary disease) ([place]) 05/07/2014  . S/P cardiac catheterization 04/23/2014  . Calculus of gallbladder with cholecystitis without biliary obstruction 03/31/2014  . Acute on chronic systolic heart failure ([place]) 02/03/2014  . Candidiasis of mouth 02/03/2014  . SOB (shortness of breath) on exertion 11/06/2013  . Dermatitis, unspecified 10/08/2013  . Insomnia 10/14/2010  . Edema 05/20/2010  . Anxiety state 03/04/2010  . Coronary artery disease 03/04/2010    Past  Surgical History:  Procedure Laterality Date  . CARDIAC CATHETERIZATION  04/2014   Dr. Idelle Leech  . CARDIAC CATHETERIZATION    . CARDIAC DEFIBRILLATOR PLACEMENT    . CHOLECYSTECTOMY N/A 08/23/2016   Procedure: LAPAROSCOPIC CHOLECYSTECTOMY;  Surgeon: Clayburn Pert, MD;  Location: [facility] ORS;  Service: General;  Laterality: N/A;  . HERNIA REPAIR      OB History    No data available       Home Medications    Prior to Admission medications   Medication Sig Start Date End Date Taking? Authorizing Provider  acetaminophen (TYLENOL) 500 MG tablet Take 500-1,000 mg by mouth every 8 (eight) hours as needed for mild pain or headache.    Yes [provider]  aspirin EC 81 MG tablet Take 81 mg by mouth daily.   Yes [provider]  carvedilol (COREG) 6.25 MG tablet Take 6.25 mg by mouth 2 (two) times daily.    Yes [provider]  furosemide (LASIX) 20 MG tablet Take 20 mg by mouth daily.   Yes [provider]  OXYGEN Inhale 2 L into the lungs daily.   Yes [provider]  HYDROcodone-acetaminophen (NORCO/VICODIN) 5-325 MG tablet Take 1 tablet by mouth every 8 (eight) hours as needed for moderate pain. 03/08/17   Coral Spikes, DO    Family History Family History  Problem Relation Age of Onset  . Diabetes Mellitus II Mother   . Heart attack Father   . Heart disease  Brother   . Arthritis Sister     Social History Social History   Tobacco Use  . Smoking status: Former Smoker    Packs/day: 0.25    Types: Cigarettes  . Smokeless tobacco: Never Used  Substance Use Topics  . Alcohol use: No    Alcohol/week: 0.0 oz  . Drug use: No     Allergies   Levaquin [levofloxacin]; Amoxicillin; Nitrofuran derivatives; Penicillins; Zithromax [azithromycin]; and Antihistamines, chlorpheniramine-type   Review of Systems Review of Systems  Constitutional: Negative.   Musculoskeletal:       Right ankle pain, swelling, bruising.   Physical  Exam Triage Vital Signs ED Triage Vitals [03/08/17 0921]  Enc Vitals Group     BP (!) 125/50     Pulse Rate 65     Resp 16     Temp 98.3 F (36.8 C)     Temp Source Oral     SpO2 96 %     Weight      Height      Head Circumference      Peak Flow      Pain Score 10     Pain Loc      Pain Edu?      Excl. in [place]?    Updated Vital Signs BP (!) 125/50 (BP Location: Left Arm)   Pulse 65   Temp 98.3 F (36.8 C) (Oral)   Resp 16   SpO2 96%     Physical Exam  Constitutional: She is oriented to person, place, and time. She appears well-developed and well-nourished. No distress.  HENT:  Head: Normocephalic and atraumatic.  Nose: Nose normal.  Pulmonary/Chest: Effort normal. No respiratory distress.  Musculoskeletal:  Right ankle -swelling and bruising noted around the lateral malleolus.  Exquisitely tender to palpation at the lateral malleolus.  Neurovascular intact distally.  Neurological: She is alert and oriented to person, place, and time.  Psychiatric: She has a normal mood and affect. Her behavior is normal.  Nursing note and vitals reviewed.  UC Treatments / Results  Labs (all labs ordered are listed, but only abnormal results are displayed) Labs Reviewed - No data to display  EKG  EKG Interpretation None       Radiology Dg Ankle Complete Right  Result Date: 03/08/2017 CLINICAL DATA:  Right ankle injury when the patient slipped on her porch last night. Initial encounter. EXAM: RIGHT ANKLE - COMPLETE 3+ VIEW COMPARISON:  None. FINDINGS: The patient has a nondisplaced fracture of the distal fibula. No other acute bony or joint abnormality is seen. Soft tissue swelling about the fracture is noted. IMPRESSION: Nondisplaced distal fibular fracture. Electronically Signed   By: Inge Rise M.D.   On: 03/08/2017 10:39    Procedures Procedures (including critical care time)  Medications Ordered in UC Medications - No data to display   Initial Impression /  Assessment and Plan / UC Course  I have reviewed the triage vital signs and the nursing notes.  Pertinent labs & imaging results that were available during my care of the patient were reviewed by me and considered in my medical decision making (see chart for details).     67 year old female presents with a nondisplaced distal fibular fracture.  Placed in a stirrup splint and given crutches.  [place] controlled substance database reviewed.  No discrepancies/concerns. Rx for vicodin sent. Advised to call [facility] tomorrow.  Final Clinical Impressions(s) / UC Diagnoses   Final diagnoses:  Closed fracture of distal end  of right fibula, unspecified fracture morphology, initial encounter    ED Discharge Orders        Ordered    HYDROcodone-acetaminophen (NORCO/VICODIN) 5-325 MG tablet  Every 8 hours PRN     03/08/17 1054     Controlled Substance Prescriptions [place] Controlled Substance Registry consulted? Yes, I have consulted the [place] Controlled Substances Registry for this patient, and feel the risk/benefit ratio today is favorable for proceeding with this prescription for a controlled substance.   Coral Spikes, DO 03/08/17 1104

## 2017-03-08 NOTE — Discharge Instructions (Signed)
Call [facility] for an appt tomorrow. [phone]  Rest. No weight bearing.  Pain medication as needed.  Take care  Dr. Lacinda Axon

## 2017-03-14 ENCOUNTER — Ambulatory Visit: Payer: Medicare Other | Admitting: Unknown Physician Specialty

## 2017-04-16 ENCOUNTER — Ambulatory Visit (INDEPENDENT_AMBULATORY_CARE_PROVIDER_SITE_OTHER): Payer: Medicare Other | Admitting: Unknown Physician Specialty

## 2017-04-16 ENCOUNTER — Telehealth: Payer: Self-pay | Admitting: *Deleted

## 2017-04-16 ENCOUNTER — Encounter: Payer: Self-pay | Admitting: Unknown Physician Specialty

## 2017-04-16 VITALS — BP 131/80 | HR 77 | Temp 98.0°F | Ht 62.1 in | Wt 201.3 lb

## 2017-04-16 DIAGNOSIS — I10 Essential (primary) hypertension: Secondary | ICD-10-CM

## 2017-04-16 DIAGNOSIS — I5023 Acute on chronic systolic (congestive) heart failure: Secondary | ICD-10-CM

## 2017-04-16 DIAGNOSIS — I340 Nonrheumatic mitral (valve) insufficiency: Secondary | ICD-10-CM

## 2017-04-16 NOTE — Telephone Encounter (Signed)
Received referral for initial lung cancer screening scan. Contacted patient and obtained smoking history,which includes a quit date in 2015, and less than 30 pack year. Patient verbalizes understanding that she is not a candidate for a lung screening scan.

## 2017-04-16 NOTE — Progress Notes (Signed)
BP 131/80   Pulse 77   Temp 98 F (36.7 C) (Oral)   Ht 5' 2.1" (1.577 m)   Wt 201 lb 4.8 oz (91.3 kg)   SpO2 (!) 86%   BMI 36.70 kg/m    Subjective:    Patient ID: Maria Neal, female    DOB: 08/23/1950, 67 y.o.   MRN: [id number]  HPI: Maria Neal is a 67 y.o. female  Chief Complaint  Patient presents with  . Establish Care    pt wants to discuss varicose veins   Pt is here as a new patient.  She is a patient of Dr. Clemmie Krill.    CAD/CHF Sees Dr. Josefa Half for management of CAD and CHF.  Echo done 2-3 days ago.  Has a defibrillator.  Has been stable on current treatment.  Reviewed note from Dr. Mindi Curling and nted normal coronary anatomy 04/14/14.  Not on statin as state "nobody told me about cholesterol"  Takes Lasix on occasion when her rings don't fit right.    COPD Diagnosed in the past with COPD.  Quit smoking when she was 19.  Does not thinks she had a low dose CT.    Broken ankle Status post.  Under the care of Orthopedics  Depression screen [facility] 2/9 04/16/2017  Decreased Interest 0  Down, Depressed, Hopeless 0  PHQ - 2 Score 0  Altered sleeping 0  Tired, decreased energy 0  Change in appetite 0  Feeling bad or failure about yourself  0  Trouble concentrating 0  Moving slowly or fidgety/restless 0  Suicidal thoughts 0  PHQ-9 Score 0     Social History   Socioeconomic History  . Marital status: Divorced    Spouse name: Not on file  . Number of children: Not on file  . Years of education: Not on file  . Highest education level: Not on file  Occupational History  . Not on file  Social Needs  . Financial resource strain: Not on file  . Food insecurity:    Worry: Not on file    Inability: Not on file  . Transportation needs:    Medical: Not on file    Non-medical: Not on file  Tobacco Use  . Smoking status: Former Smoker    Packs/day: 0.25    Types: Cigarettes  . Smokeless tobacco: Never Used  Substance and Sexual Activity  . Alcohol use: No   Alcohol/week: 0.0 oz  . Drug use: No  . Sexual activity: Not on file  Lifestyle  . Physical activity:    Days per week: Not on file    Minutes per session: Not on file  . Stress: Not on file  Relationships  . Social connections:    Talks on phone: Not on file    Gets together: Not on file    Attends religious service: Not on file    Active member of club or organization: Not on file    Attends meetings of clubs or organizations: Not on file    Relationship status: Not on file  . Intimate partner violence:    Fear of current or ex partner: Not on file    Emotionally abused: Not on file    Physically abused: Not on file    Forced sexual activity: Not on file  Other Topics Concern  . Not on file  Social History Narrative  . Not on file   Family History  Problem Relation Age of Onset  . Diabetes Mellitus  II Mother   . Hypertension Mother   . Heart attack Father   . Heart disease Brother   . Arthritis Sister   . Depression Daughter   . Depression Son   . Schizophrenia Son   . Arthritis Sister   . Diabetes Brother    Past Medical History:  Diagnosis Date  . AICD (automatic cardioverter/defibrillator) present   . Anxiety   . CAD (coronary artery disease)   . Cardiomyopathy ([place])   . CHF (congestive heart failure) ([place])   . COPD (chronic obstructive pulmonary disease) ([place])   . Depression   . Dyspnea   . Headache   . History of shingles   . Hypertension   . Insomnia   . On home oxygen therapy    bedtime and prn  . Restless leg syndrome    Past Surgical History:  Procedure Laterality Date  . CARDIAC CATHETERIZATION  04/2014   Dr. Idelle Leech  . CARDIAC CATHETERIZATION    . CARDIAC DEFIBRILLATOR PLACEMENT    . CHOLECYSTECTOMY N/A 08/23/2016   Procedure: LAPAROSCOPIC CHOLECYSTECTOMY;  Surgeon: Clayburn Pert, MD;  Location: [facility] ORS;  Service: General;  Laterality: N/A;  . HERNIA REPAIR       Relevant past medical, surgical, family and social history reviewed  and updated as indicated. Interim medical history since our last visit reviewed. Allergies and medications reviewed and updated.  Review of Systems  Per HPI unless specifically indicated above     Objective:    BP 131/80   Pulse 77   Temp 98 F (36.7 C) (Oral)   Ht 5' 2.1" (1.577 m)   Wt 201 lb 4.8 oz (91.3 kg)   SpO2 (!) 86%   BMI 36.70 kg/m   Wt Readings from Last 3 Encounters:  04/16/17 201 lb 4.8 oz (91.3 kg)  02/08/17 170 lb (77.1 kg)  09/22/16 195 lb (88.5 kg)    Physical Exam  Constitutional: She is oriented to person, place, and time. She appears well-developed and well-nourished.  HENT:  Head: Normocephalic and atraumatic.  Eyes: Pupils are equal, round, and reactive to light. Right eye exhibits no discharge. Left eye exhibits no discharge. No scleral icterus.  Neck: Normal range of motion. Neck supple. Carotid bruit is not present. No thyromegaly present.  Cardiovascular: Normal rate and regular rhythm. Exam reveals no gallop and no friction rub.  Murmur heard.  Systolic murmur is present with a grade of 4/6. Pulmonary/Chest: Effort normal and breath sounds normal. No respiratory distress. She has no wheezes. She has no rales. No breast tenderness or discharge.  Abdominal: Soft. Bowel sounds are normal. There is no tenderness. There is no rebound.  Genitourinary: No breast tenderness or discharge.  Musculoskeletal: Normal range of motion.  Lymphadenopathy:    She has no cervical adenopathy.  Neurological: She is alert and oriented to person, place, and time.  Skin: Skin is warm, dry and intact. No rash noted.  Psychiatric: She has a normal mood and affect. Her speech is normal and behavior is normal. Judgment and thought content normal. Cognition and memory are normal.    Results for orders placed or performed during the hospital encounter of 02/08/17  Rapid strep screen  Result Value Ref Range   Streptococcus, Group A Screen (Direct) NEGATIVE NEGATIVE    Culture, group A strep  Result Value Ref Range   Specimen Description      THROAT Performed at [facility], [address], [place], [place] [address]  Special Requests      NONE Reflexed from [id number] Performed at [facility], [address], [place], [place] [address]    Culture      NO GROUP A STREP (S.PYOGENES) ISOLATED Performed at [facility], [place] [address], [place], [place] [address]    Report Status 02/11/2017 FINAL   Rapid Influenza A&B Antigens ([facility] only)  Result Value Ref Range   Influenza A ([facility]) NEGATIVE NEGATIVE   Influenza B ([facility]) NEGATIVE NEGATIVE      Assessment & Plan:   Problem List Items Addressed This Visit      Unprioritized   Acute on chronic systolic heart failure (HCC)   Relevant Medications   carvedilol (COREG) 25 MG tablet   losartan (COZAAR) 50 MG tablet   spironolactone (ALDACTONE) 25 MG tablet   Benign essential HTN - Primary (Chronic)   Relevant Medications   carvedilol (COREG) 25 MG tablet   losartan (COZAAR) 50 MG tablet   spironolactone (ALDACTONE) 25 MG tablet   Other Relevant Orders   Lipid Panel w/o Chol/HDL Ratio   Comprehensive metabolic panel   Mitral valve insufficiency   Relevant Medications   carvedilol (COREG) 25 MG tablet   losartan (COZAAR) 50 MG tablet   spironolactone (ALDACTONE) 25 MG tablet       Follow up plan: Return for physical.

## 2017-04-17 ENCOUNTER — Encounter: Payer: Self-pay | Admitting: Unknown Physician Specialty

## 2017-04-17 LAB — COMPREHENSIVE METABOLIC PANEL
A/G RATIO: 1.5 (ref 1.2–2.2)
ALT: 11 IU/L (ref 0–32)
AST: 17 IU/L (ref 0–40)
Albumin: 4.4 g/dL (ref 3.6–4.8)
Alkaline Phosphatase: 80 IU/L (ref 39–117)
BILIRUBIN TOTAL: 0.4 mg/dL (ref 0.0–1.2)
BUN / CREAT RATIO: 17 (ref 12–28)
BUN: 16 mg/dL (ref 8–27)
CALCIUM: 9.8 mg/dL (ref 8.7–10.3)
CHLORIDE: 98 mmol/L (ref 96–106)
CO2: 26 mmol/L (ref 20–29)
Creatinine, Ser: 0.95 mg/dL (ref 0.57–1.00)
GFR, EST AFRICAN AMERICAN: 72 mL/min/{1.73_m2} (ref >59)
GFR, EST NON AFRICAN AMERICAN: 63 mL/min/{1.73_m2} (ref >59)
GLOBULIN, TOTAL: 2.9 g/dL (ref 1.5–4.5)
Glucose: 97 mg/dL (ref 65–99)
Potassium: 4.6 mmol/L (ref 3.5–5.2)
SODIUM: 141 mmol/L (ref 134–144)
TOTAL PROTEIN: 7.3 g/dL (ref 6.0–8.5)

## 2017-04-17 LAB — LIPID PANEL W/O CHOL/HDL RATIO
CHOLESTEROL TOTAL: 204 mg/dL — AB (ref 100–199)
HDL: 47 mg/dL (ref >39)
LDL Calculated: 127 mg/dL — ABNORMAL HIGH (ref 0–99)
TRIGLYCERIDES: 150 mg/dL — AB (ref 0–149)
VLDL Cholesterol Cal: 30 mg/dL (ref 5–40)

## 2017-04-23 ENCOUNTER — Ambulatory Visit (INDEPENDENT_AMBULATORY_CARE_PROVIDER_SITE_OTHER): Payer: Medicare Other

## 2017-04-23 VITALS — BP 122/60 | HR 62 | Temp 98.0°F | Resp 17 | Ht 62.0 in | Wt 198.9 lb

## 2017-04-23 DIAGNOSIS — Z0000 Encounter for general adult medical examination without abnormal findings: Secondary | ICD-10-CM

## 2017-04-23 DIAGNOSIS — Z1211 Encounter for screening for malignant neoplasm of colon: Secondary | ICD-10-CM | POA: Diagnosis not present

## 2017-04-23 DIAGNOSIS — Z1159 Encounter for screening for other viral diseases: Secondary | ICD-10-CM | POA: Diagnosis not present

## 2017-04-23 NOTE — Patient Instructions (Signed)
Maria Neal , Thank you for taking time to come for your Medicare Wellness Visit. I appreciate your ongoing commitment to your health goals. Please review the following plan we discussed and let me know if I can assist you in the future.   Screening recommendations/referrals: Colonoscopy: cologuard ordered Mammogram: declined Bone Density: declined  Recommended yearly ophthalmology/optometry visit for glaucoma screening and checkup Recommended yearly dental visit for hygiene and checkup  Vaccinations: Influenza vaccine: up to date Pneumococcal vaccine: up to date Tdap vaccine: up to date Shingles vaccine: eligible, check with your insurance company for coverage     Advanced directives: Advance directive discussed with you today. I have provided a copy for you to complete at home and have notarized. Once this is complete please bring a copy in to our office so we can scan it into your chart.  Conditions/risks identified: Recommend drinking at least 6-8 glasses of water a day   Next appointment: Follow up on 05/02/2017 at [facility] with Regino Schultze.  Follow up in one year for your annual wellness exam.    Preventive Care 65 Years and Older, Female Preventive care refers to lifestyle choices and visits with your health care provider that can promote health and wellness. What does preventive care include?  A yearly physical exam. This is also called an annual well check.  Dental exams once or twice a year.  Routine eye exams. Ask your health care provider how often you should have your eyes checked.  Personal lifestyle choices, including:  Daily care of your teeth and gums.  Regular physical activity.  Eating a healthy diet.  Avoiding tobacco and drug use.  Limiting alcohol use.  Practicing safe sex.  Taking low-dose aspirin every day.  Taking vitamin and mineral supplements as recommended by your health care provider. What happens during an annual well check? The services  and screenings done by your health care provider during your annual well check will depend on your age, overall health, lifestyle risk factors, and family history of disease. Counseling  Your health care provider may ask you questions about your:  Alcohol use.  Tobacco use.  Drug use.  Emotional well-being.  Home and relationship well-being.  Sexual activity.  Eating habits.  History of falls.  Memory and ability to understand (cognition).  Work and work Statistician.  Reproductive health. Screening  You may have the following tests or measurements:  Height, weight, and BMI.  Blood pressure.  Lipid and cholesterol levels. These may be checked every 5 years, or more frequently if you are over 52 years old.  Skin check.  Lung cancer screening. You may have this screening every year starting at age 73 if you have a 30-pack-year history of smoking and currently smoke or have quit within the past 15 years.  Fecal occult blood test (FOBT) of the stool. You may have this test every year starting at age 68.  Flexible sigmoidoscopy or colonoscopy. You may have a sigmoidoscopy every 5 years or a colonoscopy every 10 years starting at age 60.  Hepatitis C blood test.  Hepatitis B blood test.  Sexually transmitted disease (STD) testing.  Diabetes screening. This is done by checking your blood sugar (glucose) after you have not eaten for a while (fasting). You may have this done every 1-3 years.  Bone density scan. This is done to screen for osteoporosis. You may have this done starting at age 71.  Mammogram. This may be done every 1-2 years. Talk to your health  care provider about how often you should have regular mammograms. Talk with your health care provider about your test results, treatment options, and if necessary, the need for more tests. Vaccines  Your health care provider may recommend certain vaccines, such as:  Influenza vaccine. This is recommended every  year.  Tetanus, diphtheria, and acellular pertussis (Tdap, Td) vaccine. You may need a Td booster every 10 years.  Zoster vaccine. You may need this after age 53.  Pneumococcal 13-valent conjugate (PCV13) vaccine. One dose is recommended after age 56.  Pneumococcal polysaccharide (PPSV23) vaccine. One dose is recommended after age 2. Talk to your health care provider about which screenings and vaccines you need and how often you need them. This information is not intended to replace advice given to you by your health care provider. Make sure you discuss any questions you have with your health care provider. Document Released: 01/15/2015 Document Revised: 09/08/2015 Document Reviewed: 10/20/2014 Elsevier Interactive Patient Education  2017 [place] Prevention in the Home Falls can cause injuries. They can happen to people of all ages. There are many things you can do to make your home safe and to help prevent falls. What can I do on the outside of my home?  Regularly fix the edges of walkways and driveways and fix any cracks.  Remove anything that might make you trip as you walk through a door, such as a raised step or threshold.  Trim any bushes or trees on the path to your home.  Use bright outdoor lighting.  Clear any walking paths of anything that might make someone trip, such as rocks or tools.  Regularly check to see if handrails are loose or broken. Make sure that both sides of any steps have handrails.  Any raised decks and porches should have guardrails on the edges.  Have any leaves, snow, or ice cleared regularly.  Use sand or salt on walking paths during winter.  Clean up any spills in your garage right away. This includes oil or grease spills. What can I do in the bathroom?  Use night lights.  Install grab bars by the toilet and in the tub and shower. Do not use towel bars as grab bars.  Use non-skid mats or decals in the tub or shower.  If you  need to sit down in the shower, use a plastic, non-slip stool.  Keep the floor dry. Clean up any water that spills on the floor as soon as it happens.  Remove soap buildup in the tub or shower regularly.  Attach bath mats securely with double-sided non-slip rug tape.  Do not have throw rugs and other things on the floor that can make you trip. What can I do in the bedroom?  Use night lights.  Make sure that you have a light by your bed that is easy to reach.  Do not use any sheets or blankets that are too big for your bed. They should not hang down onto the floor.  Have a firm chair that has side arms. You can use this for support while you get dressed.  Do not have throw rugs and other things on the floor that can make you trip. What can I do in the kitchen?  Clean up any spills right away.  Avoid walking on wet floors.  Keep items that you use a lot in easy-to-reach places.  If you need to reach something above you, use a strong step stool that has a grab  bar.  Keep electrical cords out of the way.  Do not use floor polish or wax that makes floors slippery. If you must use wax, use non-skid floor wax.  Do not have throw rugs and other things on the floor that can make you trip. What can I do with my stairs?  Do not leave any items on the stairs.  Make sure that there are handrails on both sides of the stairs and use them. Fix handrails that are broken or loose. Make sure that handrails are as long as the stairways.  Check any carpeting to make sure that it is firmly attached to the stairs. Fix any carpet that is loose or worn.  Avoid having throw rugs at the top or bottom of the stairs. If you do have throw rugs, attach them to the floor with carpet tape.  Make sure that you have a light switch at the top of the stairs and the bottom of the stairs. If you do not have them, ask someone to add them for you. What else can I do to help prevent falls?  Wear shoes  that:  Do not have high heels.  Have rubber bottoms.  Are comfortable and fit you well.  Are closed at the toe. Do not wear sandals.  If you use a stepladder:  Make sure that it is fully opened. Do not climb a closed stepladder.  Make sure that both sides of the stepladder are locked into place.  Ask someone to hold it for you, if possible.  Clearly mark and make sure that you can see:  Any grab bars or handrails.  First and last steps.  Where the edge of each step is.  Use tools that help you move around (mobility aids) if they are needed. These include:  Canes.  Walkers.  Scooters.  Crutches.  Turn on the lights when you go into a dark area. Replace any light bulbs as soon as they burn out.  Set up your furniture so you have a clear path. Avoid moving your furniture around.  If any of your floors are uneven, fix them.  If there are any pets around you, be aware of where they are.  Review your medicines with your doctor. Some medicines can make you feel dizzy. This can increase your chance of falling. Ask your doctor what other things that you can do to help prevent falls. This information is not intended to replace advice given to you by your health care provider. Make sure you discuss any questions you have with your health care provider. Document Released: 10/15/2008 Document Revised: 05/27/2015 Document Reviewed: 01/23/2014 Elsevier Interactive Patient Education  2017 [employer].

## 2017-04-23 NOTE — Progress Notes (Signed)
Subjective:   Maria Neal is a 67 y.o. female who presents for an Initial Medicare Annual Wellness Visit.  Review of Systems      Cardiac Risk Factors include: hypertension;advanced age (>29men, >38 women);dyslipidemia;obesity (BMI >30kg/m2)     Objective:    Today's Vitals   04/23/17 1511  BP: 122/60  Pulse: 62  Resp: 17  Temp: 98 F (36.7 C)  TempSrc: Temporal  Weight: 198 lb 14.4 oz (90.2 kg)  Height: 5\' 2"  (1.575 m)   Body mass index is 36.38 kg/m.  Advanced Directives 04/23/2017 09/22/2016 08/18/2016 08/01/2016 08/01/2016 09/02/2014 05/07/2014  Does Patient Have a Medical Advance Directive? No No No No No No No  Would patient like information on creating a medical advance directive? Yes (MAU/Ambulatory/Procedural Areas - Information given) - No - Patient declined No - Patient declined - No - patient declined information No - patient declined information    Current Medications (verified) Outpatient Encounter Medications as of 04/23/2017  Medication Sig  . acetaminophen (TYLENOL) 500 MG tablet Take 500-1,000 mg by mouth every 8 (eight) hours as needed for mild pain or headache.   Marland Kitchen aspirin EC 81 MG tablet Take 81 mg by mouth daily.  . carvedilol (COREG) 25 MG tablet Take 25 mg by mouth 2 (two) times daily.  . furosemide (LASIX) 20 MG tablet Take 20 mg by mouth daily.  Marland Kitchen losartan (COZAAR) 50 MG tablet Take 50 mg by mouth daily.  . OXYGEN Inhale 2 L into the lungs daily.  Marland Kitchen spironolactone (ALDACTONE) 25 MG tablet Take 12.5 mg by mouth daily.   No facility-administered encounter medications on file as of 04/23/2017.     Allergies (verified) Levaquin [levofloxacin]; Amoxicillin; Nitrofuran derivatives; Penicillins; Zithromax [azithromycin]; and Antihistamines, chlorpheniramine-type   History: Past Medical History:  Diagnosis Date  . AICD (automatic cardioverter/defibrillator) present   . Anxiety   . CAD (coronary artery disease)   . Cardiomyopathy ([place])   . CHF  (congestive heart failure) ([place])   . COPD (chronic obstructive pulmonary disease) ([place])   . Depression   . Dyspnea   . Headache   . History of shingles   . Hypertension   . Insomnia   . On home oxygen therapy    bedtime and prn  . Restless leg syndrome    Past Surgical History:  Procedure Laterality Date  . CARDIAC CATHETERIZATION  04/2014   Dr. Idelle Leech  . CARDIAC CATHETERIZATION    . CARDIAC DEFIBRILLATOR PLACEMENT    . CHOLECYSTECTOMY N/A 08/23/2016   Procedure: LAPAROSCOPIC CHOLECYSTECTOMY;  Surgeon: Clayburn Pert, MD;  Location: [facility] ORS;  Service: General;  Laterality: N/A;  . HERNIA REPAIR     Family History  Problem Relation Age of Onset  . Diabetes Mellitus II Mother   . Hypertension Mother   . Heart attack Father   . Heart disease Brother   . Arthritis Sister   . Depression Daughter   . Depression Son   . Schizophrenia Son   . Arthritis Sister   . Diabetes Brother    Social History   Socioeconomic History  . Marital status: Divorced    Spouse name: Not on file  . Number of children: Not on file  . Years of education: Not on file  . Highest education level: Not on file  Occupational History  . Not on file  Social Needs  . Financial resource strain: Not hard at all  . Food insecurity:    Worry: Never true  Inability: Never true  . Transportation needs:    Medical: No    Non-medical: No  Tobacco Use  . Smoking status: Former Smoker    Packs/day: 0.25    Types: Cigarettes  . Smokeless tobacco: Never Used  . Tobacco comment: quit at age 10  Substance and Sexual Activity  . Alcohol use: No    Alcohol/week: 0.0 oz  . Drug use: No  . Sexual activity: Not on file  Lifestyle  . Physical activity:    Days per week: 0 days    Minutes per session: 0 min  . Stress: Not at all  Relationships  . Social connections:    Talks on phone: More than three times a week    Gets together: Twice a week    Attends religious service: More than 4 times per  year    Active member of club or organization: No    Attends meetings of clubs or organizations: Never    Relationship status: Divorced  Other Topics Concern  . Not on file  Social History Narrative  . Not on file    Tobacco Counseling Counseling given: Not Answered Comment: quit at age 67   Clinical Intake:  Pre-visit preparation completed: Yes  Pain : No/denies pain     Nutritional Status: BMI > 30  Obese Nutritional Risks: None Diabetes: No  How often do you need to have someone help you when you read instructions, pamphlets, or other written materials from your doctor or pharmacy?: 1 - Never What is the last grade level you completed in school?: 12th grade  Interpreter Needed?: No  Information entered by :: Teya Otterson,LPN    Activities of Daily Living In your present state of health, do you have any difficulty performing the following activities: 04/23/2017 04/16/2017  Hearing? N N  Vision? N N  Difficulty concentrating or making decisions? N N  Walking or climbing stairs? Y N  Comment foot pain  -  Dressing or bathing? N N  Doing errands, shopping? N N  Preparing Food and eating ? N -  Using the Toilet? N -  In the past six months, have you accidently leaked urine? N -  Do you have problems with loss of bowel control? N -  Managing your Medications? N -  Managing your Finances? N -  Housekeeping or managing your Housekeeping? N -  Some recent data might be hidden     Immunizations and Health Maintenance Immunization History  Administered Date(s) Administered  . Influenza, High Dose Seasonal PF 10/13/2015  . Influenza,inj,quad, With Preservative 10/01/2014  . Influenza-Unspecified 09/22/2013, 10/07/2014  . Pneumococcal Conjugate-13 10/13/2015  . Pneumococcal Polysaccharide-23 09/14/2010, 05/09/2014  . Td 01/02/2013   Health Maintenance Due  Topic Date Due  . Hepatitis C Screening  08-17-1950  . COLONOSCOPY  09/13/2000    Patient Care  Team: Lynnell Jude, MD as PCP - General (Family Medicine) Isaias Cowman, MD as Consulting Physician (Cardiology)  Indicate any recent Medical Services you may have received from other than [facility] providers in the past year (date may be approximate).     Assessment:   This is a routine wellness examination for [place].  Hearing/Vision screen Vision Screening Comments: Sees Dr.Bell annually   Dietary issues and exercise activities discussed: Current Exercise Habits: The patient does not participate in regular exercise at present, Exercise limited by: None identified  Goals    . DIET - INCREASE WATER INTAKE     Recommend drinking at least  6-8 glasses of water a day       Depression Screen PHQ 2/9 Scores 04/23/2017 04/16/2017  PHQ - 2 Score 0 0  PHQ- 9 Score - 0    Fall Risk Fall Risk  04/23/2017 04/16/2017  Falls in the past year? Yes Yes  Number falls in past yr: 1 1  Injury with Fall? Yes Yes  Risk Factor Category  High Fall Risk -  Follow up Falls prevention discussed;Falls evaluation completed Falls evaluation completed    Is the patient's home free of loose throw rugs in walkways, pet beds, electrical cords, etc?   yes      Grab bars in the bathroom? no      Handrails on the stairs?   no stairs       Adequate lighting?   yes  Timed Get Up and Go Performed Completed in 8 seconds with no use of assistive devices, steady gait. No intervention needed at this time.   Cognitive Function:     6CIT Screen 04/23/2017  What Year? 0 points  What month? 0 points  What time? 0 points  Count back from 20 0 points  Months in reverse 0 points  Repeat phrase 0 points  Total Score 0    Screening Tests Health Maintenance  Topic Date Due  . Hepatitis C Screening  1950-02-03  . COLONOSCOPY  09/13/2000  . MAMMOGRAM  04/24/2018 (Originally 09/13/2000)  . DEXA SCAN  04/24/2018 (Originally 09/14/2015)  . INFLUENZA VACCINE  08/02/2017  . PNA vac Low Risk Adult (2 of 2 - PPSV23)  05/09/2019  . TETANUS/TDAP  01/03/2023    Qualifies for Shingles Vaccine? Yes, discussed shingrix vaccine   Cancer Screenings: Lung: Low Dose CT Chest recommended if Age 17-80 years, 30 pack-year currently smoking OR have quit w/in 15years. Patient does not qualify. Breast: Up to date on Mammogram? No  declined Up to date of Bone Density/Dexa? No declined Colorectal: cologuard ordered  Additional Screenings: Hepatitis C Screening: due, will order for future labs     Plan:  I have personally reviewed and addressed the Medicare Annual Wellness questionnaire and have noted the following in the patient's chart:  A. Medical and social history B. Use of alcohol, tobacco or illicit drugs  C. Current medications and supplements D. Functional ability and status E.  Nutritional status F.  Physical activity G. Advance directives H. List of other physicians I.  Hospitalizations, surgeries, and ER visits in previous 12 months J.  [place] such as hearing and vision if needed, cognitive and depression L. Referrals and appointments   In addition, I have reviewed and discussed with patient certain preventive protocols, quality metrics, and best practice recommendations. A written personalized care plan for preventive services as well as general preventive health recommendations were provided to patient.   Signed,  Tyler Aas, LPN Nurse Health Advisor   Nurse Notes:none

## 2017-04-25 ENCOUNTER — Ambulatory Visit: Payer: Medicare Other | Admitting: Unknown Physician Specialty

## 2017-04-30 ENCOUNTER — Ambulatory Visit: Payer: Medicare Other

## 2017-05-02 ENCOUNTER — Ambulatory Visit (INDEPENDENT_AMBULATORY_CARE_PROVIDER_SITE_OTHER): Payer: Medicare Other | Admitting: Unknown Physician Specialty

## 2017-05-02 ENCOUNTER — Encounter: Payer: Self-pay | Admitting: Unknown Physician Specialty

## 2017-05-02 VITALS — BP 97/60 | HR 62 | Temp 99.0°F | Ht 62.1 in | Wt 198.4 lb

## 2017-05-02 DIAGNOSIS — Z1211 Encounter for screening for malignant neoplasm of colon: Secondary | ICD-10-CM | POA: Diagnosis not present

## 2017-05-02 DIAGNOSIS — E669 Obesity, unspecified: Secondary | ICD-10-CM | POA: Insufficient documentation

## 2017-05-02 DIAGNOSIS — I5022 Chronic systolic (congestive) heart failure: Secondary | ICD-10-CM | POA: Diagnosis not present

## 2017-05-02 DIAGNOSIS — J439 Emphysema, unspecified: Secondary | ICD-10-CM

## 2017-05-02 DIAGNOSIS — Z6836 Body mass index (BMI) 36.0-36.9, adult: Secondary | ICD-10-CM

## 2017-05-02 DIAGNOSIS — E7800 Pure hypercholesterolemia, unspecified: Secondary | ICD-10-CM | POA: Diagnosis not present

## 2017-05-02 DIAGNOSIS — J449 Chronic obstructive pulmonary disease, unspecified: Secondary | ICD-10-CM | POA: Diagnosis not present

## 2017-05-02 DIAGNOSIS — Z0000 Encounter for general adult medical examination without abnormal findings: Secondary | ICD-10-CM

## 2017-05-02 MED ORDER — ATORVASTATIN CALCIUM 20 MG PO TABS: 20.0000 mg | ORAL_TABLET | Freq: Every day | ORAL | 3 refills | Status: DC

## 2017-05-02 NOTE — Assessment & Plan Note (Signed)
Spirometry was normal today not on inhalers

## 2017-05-02 NOTE — Assessment & Plan Note (Addendum)
Discussed diet and exercise.  She plans to start a diet soon.  Discussed DASH

## 2017-05-02 NOTE — Progress Notes (Signed)
BP 97/60   Pulse 62   Temp 99 F (37.2 C) (Oral)   Ht 5' 2.1" (1.577 m)   Wt 198 lb 6.4 oz (90 kg)   SpO2 92%   BMI 36.17 kg/m    Subjective:    Patient ID: Maria Neal, female    DOB: June 25, 1950, 67 y.o.   MRN: [id number]  HPI: Maria Neal is a 67 y.o. female  Chief Complaint  Patient presents with  . Medicare Wellness   Medicare wellness exam done with nurse health advisor.   CHF: Sees Dr. Josefa Half for management of CHF and mitral valve insufficiency.  Pt not on statin therapy at this time as she doesn't recall it being discussed.    COPD Takes O2 at night.  Quit smoking a long time ago.  Pulse ox 92% on room air today.  Presently not taking an inhaler.  Denies SOB and only a little DOE  Hypertension Using medications without difficulty Average home BPs not checking   No problems or lightheadedness No Edema   Social History   Socioeconomic History  . Marital status: Divorced    Spouse name: Not on file  . Number of children: Not on file  . Years of education: Not on file  . Highest education level: Not on file  Occupational History  . Not on file  Social Needs  . Financial resource strain: Not hard at all  . Food insecurity:    Worry: Never true    Inability: Never true  . Transportation needs:    Medical: No    Non-medical: No  Tobacco Use  . Smoking status: Former Smoker    Packs/day: 0.25    Types: Cigarettes  . Smokeless tobacco: Never Used  . Tobacco comment: quit at age 6  Substance and Sexual Activity  . Alcohol use: No    Alcohol/week: 0.0 oz  . Drug use: No  . Sexual activity: Not on file  Lifestyle  . Physical activity:    Days per week: 0 days    Minutes per session: 0 min  . Stress: Not at all  Relationships  . Social connections:    Talks on phone: More than three times a week    Gets together: Twice a week    Attends religious service: More than 4 times per year    Active member of club or organization: No    Attends  meetings of clubs or organizations: Never    Relationship status: Divorced  . Intimate partner violence:    Fear of current or ex partner: No    Emotionally abused: No    Physically abused: No    Forced sexual activity: No  Other Topics Concern  . Not on file  Social History Narrative  . Not on file    Relevant past medical, surgical, family and social history reviewed and updated as indicated. Interim medical history since our last visit reviewed. Allergies and medications reviewed and updated.  Review of Systems  Constitutional: Negative.   HENT: Negative.   Respiratory: Negative.   Gastrointestinal: Negative.   Genitourinary: Negative.   Musculoskeletal: Negative.   Neurological: Negative.   Psychiatric/Behavioral: Negative.     Per HPI unless specifically indicated above     Objective:    BP 97/60   Pulse 62   Temp 99 F (37.2 C) (Oral)   Ht 5' 2.1" (1.577 m)   Wt 198 lb 6.4 oz (90 kg)   SpO2 92%  BMI 36.17 kg/m   Wt Readings from Last 3 Encounters:  05/02/17 198 lb 6.4 oz (90 kg)  04/23/17 198 lb 14.4 oz (90.2 kg)  04/16/17 201 lb 4.8 oz (91.3 kg)    Physical Exam  Constitutional: She is oriented to person, place, and time. She appears well-developed and well-nourished.  HENT:  Head: Normocephalic and atraumatic.  Eyes: Pupils are equal, round, and reactive to light. Right eye exhibits no discharge. Left eye exhibits no discharge. No scleral icterus.  Neck: Normal range of motion. Neck supple. Carotid bruit is not present. No thyromegaly present.  Cardiovascular: Normal rate and regular rhythm. Exam reveals no gallop and no friction rub.  Murmur heard.  Systolic murmur is present with a grade of 4/6. Pulmonary/Chest: Effort normal and breath sounds normal. No respiratory distress. She has no wheezes. She has no rales. No breast tenderness or discharge.  Abdominal: Soft. Bowel sounds are normal. There is no tenderness. There is no rebound.  Genitourinary:  No breast tenderness or discharge.  Musculoskeletal: Normal range of motion.  Lymphadenopathy:    She has no cervical adenopathy.  Neurological: She is alert and oriented to person, place, and time.  Skin: Skin is warm, dry and intact. No rash noted.  Psychiatric: She has a normal mood and affect. Her speech is normal and behavior is normal. Judgment and thought content normal. Cognition and memory are normal.    Results for orders placed or performed in visit on 04/16/17  Lipid Panel w/o Chol/HDL Ratio  Result Value Ref Range   Cholesterol, Total 204 (H) 100 - 199 mg/dL   Triglycerides 150 (H) 0 - 149 mg/dL   HDL 47 >39 mg/dL   VLDL Cholesterol Cal 30 5 - 40 mg/dL   LDL Calculated 127 (H) 0 - 99 mg/dL  Comprehensive metabolic panel  Result Value Ref Range   Glucose 97 65 - 99 mg/dL   BUN 16 8 - 27 mg/dL   Creatinine, Ser 0.95 0.57 - 1.00 mg/dL   GFR calc non Af Amer 63 >59 mL/min/1.73   GFR calc Af Amer 72 >59 mL/min/1.73   BUN/Creatinine Ratio 17 12 - 28   Sodium 141 134 - 144 mmol/L   Potassium 4.6 3.5 - 5.2 mmol/L   Chloride 98 96 - 106 mmol/L   CO2 26 20 - 29 mmol/L   Calcium 9.8 8.7 - 10.3 mg/dL   Total Protein 7.3 6.0 - 8.5 g/dL   Albumin 4.4 3.6 - 4.8 g/dL   Globulin, Total 2.9 1.5 - 4.5 g/dL   Albumin/Globulin Ratio 1.5 1.2 - 2.2   Bilirubin Total 0.4 0.0 - 1.2 mg/dL   Alkaline Phosphatase 80 39 - 117 IU/L   AST 17 0 - 40 IU/L   ALT 11 0 - 32 IU/L      Assessment & Plan:   Problem List Items Addressed This Visit      Unprioritized   Chronic systolic congestive heart failure, NYHA class 3 ([place])    Per Dr. Josefa Half.  Pt is doing well on current treatment.  Takes Lasix on occasion when her rings don't fit right.        Relevant Medications   atorvastatin (LIPITOR) 20 MG tablet   COPD (chronic obstructive pulmonary disease) ([facility]) - Primary (Chronic)    Spirometry was normal today not on inhalers      Relevant Orders   Spirometry with Graph (Completed)    Hypercholesteremia    Start Statin for LDL of 126  due to high risk of CVD.        Relevant Medications   atorvastatin (LIPITOR) 20 MG tablet   Obesity (BMI 30-39.9)    Discussed diet and exercise.  She plans to start a diet soon.  Discussed DASH       Other Visit Diagnoses    Annual physical exam       Colon cancer screening       Relevant Orders   Cologuard      CMP, Lipid, Hep C in 3 months  Refusing mammogram and Dexa scan  Follow up plan: Return in about 3 months (around 08/02/2017).

## 2017-05-02 NOTE — Assessment & Plan Note (Addendum)
Per Dr. Josefa Half.  Pt is doing well on current treatment.  Takes Lasix on occasion when her rings don't fit right.

## 2017-05-02 NOTE — Assessment & Plan Note (Signed)
Start Statin for LDL of 126 due to high risk of CVD.

## 2017-05-02 NOTE — Patient Instructions (Signed)
Preventive Care 65 Years and Older, Female Preventive care refers to lifestyle choices and visits with your health care provider that can promote health and wellness. What does preventive care include?  A yearly physical exam. This is also called an annual well check.  Dental exams once or twice a year.  Routine eye exams. Ask your health care provider how often you should have your eyes checked.  Personal lifestyle choices, including: ? Daily care of your teeth and gums. ? Regular physical activity. ? Eating a healthy diet. ? Avoiding tobacco and drug use. ? Limiting alcohol use. ? Practicing safe sex. ? Taking low-dose aspirin every day. ? Taking vitamin and mineral supplements as recommended by your health care provider. What happens during an annual well check? The services and screenings done by your health care provider during your annual well check will depend on your age, overall health, lifestyle risk factors, and family history of disease. Counseling Your health care provider may ask you questions about your:  Alcohol use.  Tobacco use.  Drug use.  Emotional well-being.  Home and relationship well-being.  Sexual activity.  Eating habits.  History of falls.  Memory and ability to understand (cognition).  Work and work environment.  Reproductive health.  Screening You may have the following tests or measurements:  Height, weight, and BMI.  Blood pressure.  Lipid and cholesterol levels. These may be checked every 5 years, or more frequently if you are over 50 years old.  Skin check.  Lung cancer screening. You may have this screening every year starting at age 55 if you have a 30-pack-year history of smoking and currently smoke or have quit within the past 15 years.  Fecal occult blood test (FOBT) of the stool. You may have this test every year starting at age 50.  Flexible sigmoidoscopy or colonoscopy. You may have a sigmoidoscopy every 5 years or  a colonoscopy every 10 years starting at age 50.  Hepatitis C blood test.  Hepatitis B blood test.  Sexually transmitted disease (STD) testing.  Diabetes screening. This is done by checking your blood sugar (glucose) after you have not eaten for a while (fasting). You may have this done every 1-3 years.  Bone density scan. This is done to screen for osteoporosis. You may have this done starting at age 65.  Mammogram. This may be done every 1-2 years. Talk to your health care provider about how often you should have regular mammograms.  Talk with your health care provider about your test results, treatment options, and if necessary, the need for more tests. Vaccines Your health care provider may recommend certain vaccines, such as:  Influenza vaccine. This is recommended every year.  Tetanus, diphtheria, and acellular pertussis (Tdap, Td) vaccine. You may need a Td booster every 10 years.  Varicella vaccine. You may need this if you have not been vaccinated.  Zoster vaccine. You may need this after age 60.  Measles, mumps, and rubella (MMR) vaccine. You may need at least one dose of MMR if you were born in 1957 or later. You may also need a second dose.  Pneumococcal 13-valent conjugate (PCV13) vaccine. One dose is recommended after age 65.  Pneumococcal polysaccharide (PPSV23) vaccine. One dose is recommended after age 65.  Meningococcal vaccine. You may need this if you have certain conditions.  Hepatitis A vaccine. You may need this if you have certain conditions or if you travel or work in places where you may be exposed to hepatitis   A.  Hepatitis B vaccine. You may need this if you have certain conditions or if you travel or work in places where you may be exposed to hepatitis B.  Haemophilus influenzae type b (Hib) vaccine. You may need this if you have certain conditions.  Talk to your health care provider about which screenings and vaccines you need and how often you  need them. This information is not intended to replace advice given to you by your health care provider. Make sure you discuss any questions you have with your health care provider. Document Released: 01/15/2015 Document Revised: 09/08/2015 Document Reviewed: 10/20/2014 Elsevier Interactive Patient Education  2018 [employer]  

## 2017-05-16 ENCOUNTER — Ambulatory Visit (INDEPENDENT_AMBULATORY_CARE_PROVIDER_SITE_OTHER): Payer: Medicare Other | Admitting: Unknown Physician Specialty

## 2017-05-16 ENCOUNTER — Encounter: Payer: Self-pay | Admitting: Unknown Physician Specialty

## 2017-05-16 VITALS — BP 118/64 | HR 69 | Ht 64.0 in | Wt 202.0 lb

## 2017-05-16 DIAGNOSIS — M67912 Unspecified disorder of synovium and tendon, left shoulder: Secondary | ICD-10-CM

## 2017-05-16 DIAGNOSIS — M25512 Pain in left shoulder: Secondary | ICD-10-CM | POA: Diagnosis not present

## 2017-05-16 MED ORDER — TRIAMCINOLONE ACETONIDE 40 MG/ML IJ SUSP
40.0000 mg | Freq: Once | INTRAMUSCULAR | Status: AC
  Administered 2017-05-16: 40 mg via INTRAMUSCULAR

## 2017-05-16 MED ORDER — HYDROCODONE-ACETAMINOPHEN 5-325 MG PO TABS: 1.0000 | ORAL_TABLET | Freq: Four times a day (QID) | ORAL | 0 refills | Status: DC | PRN

## 2017-05-16 NOTE — Progress Notes (Signed)
BP 118/64   Pulse 69   Ht 5\' 4"  (1.626 m)   Wt 202 lb (91.6 kg)   SpO2 93%   BMI 34.67 kg/m    Subjective:    Patient ID: Maria Neal, female    DOB: May 29, 1950, 67 y.o.   MRN: [id number]  HPI: Maria Neal is a 67 y.o. female  Chief Complaint  Patient presents with  . Pain    Left Shoulder x 5 days. Pt feel x 2 months ago, unsure if this is root cause   Shoulder Pain   The pain is present in the left shoulder and left arm. This is a new problem. Episode onset: 5 days. History of extremity trauma: [place] 2 months ago and broke ankle. The problem occurs constantly. The quality of the pain is described as aching. The pain is severe. Associated symptoms include a limited range of motion. The symptoms are aggravated by lying down and activity. Treatments tried: Tylenol, Ben gay, Muscle rub. The treatment provided no relief.     Relevant past medical, surgical, family and social history reviewed and updated as indicated. Interim medical history since our last visit reviewed. Allergies and medications reviewed and updated.  Review of Systems  Per HPI unless specifically indicated above     Objective:    BP 118/64   Pulse 69   Ht 5\' 4"  (1.626 m)   Wt 202 lb (91.6 kg)   SpO2 93%   BMI 34.67 kg/m   Wt Readings from Last 3 Encounters:  05/16/17 202 lb (91.6 kg)  05/02/17 198 lb 6.4 oz (90 kg)  04/23/17 198 lb 14.4 oz (90.2 kg)    Physical Exam  Constitutional: She is oriented to person, place, and time. She appears well-developed and well-nourished. No distress.  HENT:  Head: Normocephalic and atraumatic.  Eyes: Conjunctivae and lids are normal. Right eye exhibits no discharge. Left eye exhibits no discharge. No scleral icterus.  Cardiovascular: Normal rate.  Pulmonary/Chest: Effort normal.  Abdominal: Normal appearance. There is no splenomegaly or hepatomegaly.  Musculoskeletal:       Left shoulder: She exhibits decreased range of motion, tenderness, pain and decreased  strength. She exhibits no bony tenderness, no swelling, no effusion and no crepitus.  Positive impingement signs  Neurological: She is alert and oriented to person, place, and time.  Skin: Skin is intact. No rash noted. No pallor.  Psychiatric: She has a normal mood and affect. Her behavior is normal. Judgment and thought content normal.   STEROID INJECTION  Procedure: Shoulder Intraarticular Steroid Injection Consent: DEL Risks, benefits, and alternative treatments discussed and all questions were answered.  Patient elected to proceed and verbal consent obtained.  Description: Area prepped and draped using   semi-sterile technique. Using a posterolateral approach a mixture of 4cc 0.5% marcaine & 1 cc Kenalog 40 injected in shoulder joint.  A bandage was then placed over the injection site. Complications:  none     Results for orders placed or performed in visit on 04/16/17  Lipid Panel w/o Chol/HDL Ratio  Result Value Ref Range   Cholesterol, Total 204 (H) 100 - 199 mg/dL   Triglycerides 150 (H) 0 - 149 mg/dL   HDL 47 >39 mg/dL   VLDL Cholesterol Cal 30 5 - 40 mg/dL   LDL Calculated 127 (H) 0 - 99 mg/dL  Comprehensive metabolic panel  Result Value Ref Range   Glucose 97 65 - 99 mg/dL   BUN 16 8 - 27  mg/dL   Creatinine, Ser 0.95 0.57 - 1.00 mg/dL   GFR calc non Af Amer 63 >59 mL/min/1.73   GFR calc Af Amer 72 >59 mL/min/1.73   BUN/Creatinine Ratio 17 12 - 28   Sodium 141 134 - 144 mmol/L   Potassium 4.6 3.5 - 5.2 mmol/L   Chloride 98 96 - 106 mmol/L   CO2 26 20 - 29 mmol/L   Calcium 9.8 8.7 - 10.3 mg/dL   Total Protein 7.3 6.0 - 8.5 g/dL   Albumin 4.4 3.6 - 4.8 g/dL   Globulin, Total 2.9 1.5 - 4.5 g/dL   Albumin/Globulin Ratio 1.5 1.2 - 2.2   Bilirubin Total 0.4 0.0 - 1.2 mg/dL   Alkaline Phosphatase 80 39 - 117 IU/L   AST 17 0 - 40 IU/L   ALT 11 0 - 32 IU/L      Assessment & Plan:   Problem List Items Addressed This Visit    None    Visit Diagnoses    Disorder  of left rotator cuff    -  Primary   rotator cuff tendonitis.  Severe due to very poor range of motion.  Refer to orthopedics for further assessment.  Steroid inj performed here.     Relevant Medications   triamcinolone acetonide (KENALOG-40) injection 40 mg (Completed)   Acute pain of left shoulder       New problem.  continue with Tylenol prn.  Limited rx for #10 Hydrocodone.  Pt with improvement following injection.  Use ice and heat       Follow up plan: No follow-ups on file.

## 2017-05-17 DIAGNOSIS — M5412 Radiculopathy, cervical region: Secondary | ICD-10-CM | POA: Insufficient documentation

## 2017-06-07 ENCOUNTER — Telehealth: Payer: Self-pay | Admitting: Unknown Physician Specialty

## 2017-06-07 MED ORDER — SULFAMETHOXAZOLE-TRIMETHOPRIM 800-160 MG PO TABS: 1.0000 | ORAL_TABLET | Freq: Two times a day (BID) | ORAL | 0 refills | Status: DC

## 2017-06-07 NOTE — Telephone Encounter (Signed)
Copied from [place] [id number]. Topic: Quick Communication - See Telephone Encounter >> Jun 07, 2017 10:22 AM Bea Graff, NT wrote: CRM for notification. See Telephone encounter for: 06/07/17. Pt states she needs and order for antibiotics to take for 3 days prior to her teeth pulled and for 3 days after her teeth pulled. She would like to see if this can be called in. [place], [place] - [place]. [phone] (Phone) [phone] (Fax)

## 2017-07-02 ENCOUNTER — Other Ambulatory Visit: Payer: Self-pay | Admitting: Unknown Physician Specialty

## 2017-07-03 ENCOUNTER — Encounter: Payer: Self-pay | Admitting: Family Medicine

## 2017-07-03 ENCOUNTER — Ambulatory Visit (INDEPENDENT_AMBULATORY_CARE_PROVIDER_SITE_OTHER): Payer: Medicare Other | Admitting: Family Medicine

## 2017-07-03 VITALS — BP 117/56 | HR 54 | Temp 98.4°F | Ht 64.0 in | Wt 201.5 lb

## 2017-07-03 DIAGNOSIS — B372 Candidiasis of skin and nail: Secondary | ICD-10-CM | POA: Diagnosis not present

## 2017-07-03 DIAGNOSIS — R001 Bradycardia, unspecified: Secondary | ICD-10-CM | POA: Diagnosis not present

## 2017-07-03 MED ORDER — NYSTATIN 100000 UNIT/GM EX POWD: Freq: Four times a day (QID) | CUTANEOUS | 0 refills | Status: DC

## 2017-07-03 MED ORDER — NYSTATIN 100000 UNIT/GM EX CREA: 1.0000 "application " | TOPICAL_CREAM | Freq: Two times a day (BID) | CUTANEOUS | 1 refills | Status: DC

## 2017-07-03 NOTE — Progress Notes (Signed)
BP (!) 117/56 (BP Location: Left Arm, Patient Position: Sitting, Cuff Size: Normal)   Pulse (!) 54   Temp 98.4 F (36.9 C) (Oral)   Ht 5\' 4"  (1.626 m)   Wt 201 lb 8 oz (91.4 kg)   SpO2 94%   BMI 34.59 kg/m    Subjective:    Patient ID: Maria Neal, female    DOB: [birth year], 67 y.o.   MRN: [id number]  HPI: Maria Neal is a 67 y.o. female  Chief Complaint  Patient presents with  . Rash    Ongoing for 3 or 4 days. Itches badly. Groin area.   Marland Kitchen FYI for provider    Patient's pulse varied from 48-76 rapidly. Using 2 different pulse-ox meters. Highest 76, lowest 48. Mostly remained in the 50's.   4 day hx of pruritic rash in left groin fold. Denies scaling, drainage, burning. Tried cortisone cream with no relief. States she's gotten this in the past, seems worse in the summertime.   Followed by Dr. Josefa Neal for her heart medications, seeing them again in a few weeks. Had some light-headedness yesterday but feels well today. States she stays well hydrated as a rule. Has a pulse oximeter at home that she uses on occasion to check her vitals. Compliant with her heart medicines. Denies CP, SOB, syncope.    Past Medical History:  Diagnosis Date  . AICD (automatic cardioverter/defibrillator) present   . Anxiety   . CAD (coronary artery disease)   . Cardiomyopathy ([place])   . CHF (congestive heart failure) ([place])   . COPD (chronic obstructive pulmonary disease) ([place])   . Depression   . Dyspnea   . Headache   . History of shingles   . Hypertension   . Insomnia   . On home oxygen therapy    bedtime and prn  . Restless leg syndrome    Social History   Socioeconomic History  . Marital status: Divorced    Spouse name: Not on file  . Number of children: Not on file  . Years of education: Not on file  . Highest education level: Not on file  Occupational History  . Not on file  Social Needs  . Financial resource strain: Not hard at all  . Food insecurity:    Worry: Never true      Inability: Never true  . Transportation needs:    Medical: No    Non-medical: No  Tobacco Use  . Smoking status: Former Smoker    Packs/day: 0.25    Types: Cigarettes  . Smokeless tobacco: Never Used  . Tobacco comment: quit at age 74  Substance and Sexual Activity  . Alcohol use: No    Alcohol/week: 0.0 oz  . Drug use: No  . Sexual activity: Not on file  Lifestyle  . Physical activity:    Days per week: 0 days    Minutes per session: 0 min  . Stress: Not at all  Relationships  . Social connections:    Talks on phone: More than three times a week    Gets together: Twice a week    Attends religious service: More than 4 times per year    Active member of club or organization: No    Attends meetings of clubs or organizations: Never    Relationship status: Divorced  . Intimate partner violence:    Fear of current or ex partner: No    Emotionally abused: No    Physically abused: No  Forced sexual activity: No  Other Topics Concern  . Not on file  Social History Narrative  . Not on file    Relevant past medical, surgical, family and social history reviewed and updated as indicated. Interim medical history since our last visit reviewed. Allergies and medications reviewed and updated.  Review of Systems  Per HPI unless specifically indicated above     Objective:    BP (!) 117/56 (BP Location: Left Arm, Patient Position: Sitting, Cuff Size: Normal)   Pulse (!) 54   Temp 98.4 F (36.9 C) (Oral)   Ht 5\' 4"  (1.626 m)   Wt 201 lb 8 oz (91.4 kg)   SpO2 94%   BMI 34.59 kg/m   Wt Readings from Last 3 Encounters:  07/03/17 201 lb 8 oz (91.4 kg)  05/16/17 202 lb (91.6 kg)  05/02/17 198 lb 6.4 oz (90 kg)    Physical Exam  Constitutional: She is oriented to person, place, and time. She appears well-developed and well-nourished. No distress.  HENT:  Head: Atraumatic.  Eyes: Conjunctivae are normal.  Neck: Normal range of motion. Neck supple.  Cardiovascular:  Regular rhythm.  bradycardic  Pulmonary/Chest: Effort normal and breath sounds normal. No respiratory distress.  Musculoskeletal: Normal range of motion.  Neurological: She is alert and oriented to person, place, and time.  Skin: Skin is warm and dry. Rash (erythematous rash left groin fold) noted.  Psychiatric: She has a normal mood and affect. Her behavior is normal.  Nursing note and vitals reviewed.   Results for orders placed or performed in visit on 04/16/17  Lipid Panel w/o Chol/HDL Ratio  Result Value Ref Range   Cholesterol, Total 204 (H) 100 - 199 mg/dL   Triglycerides 150 (H) 0 - 149 mg/dL   HDL 47 >39 mg/dL   VLDL Cholesterol Cal 30 5 - 40 mg/dL   LDL Calculated 127 (H) 0 - 99 mg/dL  Comprehensive metabolic panel  Result Value Ref Range   Glucose 97 65 - 99 mg/dL   BUN 16 8 - 27 mg/dL   Creatinine, Ser 0.95 0.57 - 1.00 mg/dL   GFR calc non Af Amer 63 >59 mL/min/1.73   GFR calc Af Amer 72 >59 mL/min/1.73   BUN/Creatinine Ratio 17 12 - 28   Sodium 141 134 - 144 mmol/L   Potassium 4.6 3.5 - 5.2 mmol/L   Chloride 98 96 - 106 mmol/L   CO2 26 20 - 29 mmol/L   Calcium 9.8 8.7 - 10.3 mg/dL   Total Protein 7.3 6.0 - 8.5 g/dL   Albumin 4.4 3.6 - 4.8 g/dL   Globulin, Total 2.9 1.5 - 4.5 g/dL   Albumin/Globulin Ratio 1.5 1.2 - 2.2   Bilirubin Total 0.4 0.0 - 1.2 mg/dL   Alkaline Phosphatase 80 39 - 117 IU/L   AST 17 0 - 40 IU/L   ALT 11 0 - 32 IU/L      Assessment & Plan:   Problem List Items Addressed This Visit    None    Visit Diagnoses    Cutaneous candidiasis    -  Primary   Nystatin cream and powder prn, keep area clean and dry. F/u if no improvement   Relevant Medications   nystatin cream (MYCOSTATIN)   nystatin (NYSTATIN) powder   Bradycardia       Pt asymptomatic today, discussed to check pulse at home regularly, especially if dizzy, and call Cardiologist if remaining low. Has f/u with them coming up  Follow up plan: Return in about 4 months  (around 11/03/2017) for 6 month f/u.

## 2017-07-05 NOTE — Patient Instructions (Signed)
Follow up in 6 months 

## 2017-07-30 ENCOUNTER — Telehealth: Payer: Self-pay

## 2017-07-30 DIAGNOSIS — Z1211 Encounter for screening for malignant neoplasm of colon: Secondary | ICD-10-CM

## 2017-07-30 NOTE — Telephone Encounter (Signed)
Order signed and faxed to [employer].

## 2017-07-30 NOTE — Telephone Encounter (Signed)
Copied from [place] [id number]. Topic: General - Other >> Jul 30, 2017  1:43 PM Burchel, Abbi R wrote: Pt calling to request cologuard screening. Please advise.   Pt: [phone]  >> Jul 30, 2017  2:34 PM Shaune Pollack wrote: See message please advise thank you   Routing to provider for order.

## 2017-09-17 ENCOUNTER — Ambulatory Visit: Payer: Medicare Other

## 2017-09-18 ENCOUNTER — Ambulatory Visit (INDEPENDENT_AMBULATORY_CARE_PROVIDER_SITE_OTHER): Payer: Medicare Other

## 2017-09-18 ENCOUNTER — Telehealth: Payer: Self-pay | Admitting: Family Medicine

## 2017-09-18 DIAGNOSIS — Z23 Encounter for immunization: Secondary | ICD-10-CM | POA: Diagnosis not present

## 2017-09-18 NOTE — Telephone Encounter (Signed)
Tried to call pt, no answer and unable to leave a VM. Will try to call pt again.

## 2017-09-18 NOTE — Telephone Encounter (Signed)
She doesn't absolutely have to have another one but I think it would be reasonable to repeat the Pneumovax since she hasn't had one after age 67  Copied from [place] [id number]. Topic: General - Other >> Sep 18, 2017  9:55 AM Carolyn Stare wrote:  Pt would like to know if she need a pneumonia shot >> Sep 18, 2017 11:21 AM Lesle Chris, [place] wrote: Does she needs another pneumococcal vaccine?

## 2017-09-19 NOTE — Telephone Encounter (Signed)
Attempted to reach patient. VM box unable to take message.

## 2017-09-24 NOTE — Telephone Encounter (Signed)
Patient notified of Rachel's message.

## 2017-10-03 ENCOUNTER — Telehealth: Payer: Self-pay | Admitting: Family Medicine

## 2017-10-03 NOTE — Telephone Encounter (Signed)
Attempted to contact pt regarding symptoms; unable to leave message; received pre-recorded message stating "the wireless customer is not receiving calls at this time"; unable to complete nurse triage.

## 2017-10-03 NOTE — Telephone Encounter (Signed)
Attempted to call pt. To discuss her skin issue. Unable to leave message.

## 2017-10-03 NOTE — Telephone Encounter (Unsigned)
Copied from [place] [id number]. Topic: Quick Communication - See Telephone Encounter >> Oct 03, 2017 10:50 AM Marja Kays F wrote: Pt is needing to get a refill on her nystatin powder she has changed laundry detergent and has developed a irritation at her privates   Best number [place]

## 2017-10-04 ENCOUNTER — Other Ambulatory Visit: Payer: Self-pay | Admitting: Family Medicine

## 2017-10-04 NOTE — Telephone Encounter (Signed)
Requested medication (s) are due for refill today: yes  Requested medication (s) are on the active medication list: yes  Last refill:  07/03/17  Future visit scheduled: no  Notes to clinic:  Unable to fill per      Requested Prescriptions  Pending Prescriptions Disp Refills   nystatin (MYCOSTATIN/NYSTOP) powder [Pharmacy Med Name: NYSTATIN 100000 UNIT/GM TOP POW GM] 60 g 0    Sig: APPLY TOPICALLY 4 TIMES DAILY     Off-Protocol Failed - 10/04/2017  9:29 AM      Failed - Medication not assigned to a protocol, review manually.      Passed - Valid encounter within last 12 months    Recent Outpatient Visits          3 months ago Cutaneous candidiasis   [facility] Merrie Roof [place], [place]   4 months ago Disorder of left rotator cuff   [facility] Kathrine Haddock, NP   5 months ago Pulmonary emphysema, unspecified emphysema type [facility])   [facility] Kathrine Haddock, NP   5 months ago Benign essential HTN   [facility] Kathrine Haddock, NP      Future Appointments            In 6 months [facility], [place]

## 2017-11-02 ENCOUNTER — Other Ambulatory Visit: Payer: Self-pay | Admitting: Family Medicine

## 2017-11-02 NOTE — Telephone Encounter (Signed)
Requested medication (s) are due for refill today: yes  Requested medication (s) are on the active medication list: yes  Last refill:  10/04/17  Future visit scheduled: yes  Notes to clinic:  Unable to fill per protocol    Requested Prescriptions  Pending Prescriptions Disp Refills   nystatin (MYCOSTATIN/NYSTOP) powder [Pharmacy Med Name: NYSTATIN 100000 UNIT/GM TOP POW GM] 60 g 0    Sig: APPLY TOPICALLY 4 TIMES DAILY     Off-Protocol Failed - 11/02/2017  6:11 PM      Failed - Medication not assigned to a protocol, review manually.      Passed - Valid encounter within last 12 months    Recent Outpatient Visits          4 months ago Cutaneous candidiasis   [place], [place]   5 months ago Disorder of left rotator cuff   [facility] Kathrine Haddock, NP   6 months ago Pulmonary emphysema, unspecified emphysema type [facility])   [facility] Kathrine Haddock, NP   6 months ago Benign essential HTN   [facility] Kathrine Haddock, NP      Future Appointments            In 5 months [facility], [place]

## 2017-11-05 ENCOUNTER — Ambulatory Visit (INDEPENDENT_AMBULATORY_CARE_PROVIDER_SITE_OTHER): Payer: Medicare Other | Admitting: Family Medicine

## 2017-11-05 ENCOUNTER — Encounter: Payer: Self-pay | Admitting: Family Medicine

## 2017-11-05 VITALS — BP 130/76 | HR 71 | Temp 97.8°F | Ht 64.0 in | Wt 204.0 lb

## 2017-11-05 DIAGNOSIS — M549 Dorsalgia, unspecified: Secondary | ICD-10-CM

## 2017-11-05 LAB — UA/M W/RFLX CULTURE, ROUTINE
Bilirubin, UA: NEGATIVE
GLUCOSE, UA: NEGATIVE
Ketones, UA: NEGATIVE
LEUKOCYTES UA: NEGATIVE
Nitrite, UA: NEGATIVE
Protein, UA: NEGATIVE
RBC, UA: NEGATIVE
SPEC GRAV UA: 1.025 (ref 1.005–1.030)
Urobilinogen, Ur: 0.2 mg/dL (ref 0.2–1.0)
pH, UA: 5.5 (ref 5.0–7.5)

## 2017-11-05 NOTE — Progress Notes (Signed)
   BP 130/76 (BP Location: Left Arm, Patient Position: Sitting, Cuff Size: Normal)   Pulse 71   Temp 97.8 F (36.6 C)   Ht 5\' 4"  (1.626 m)   Wt 204 lb (92.5 kg)   SpO2 92%   BMI 35.02 kg/m    Subjective:    Patient ID: Maria Neal, female    DOB: 01-May-1950, 67 y.o.   MRN: [id number]  HPI: Maria Neal is a 67 y.o. female  Chief Complaint  Patient presents with  . Urinary Frequency  . Back Pain   Right low back pain x 4-5 days and urinary frequency, sometimes more severe than others. No injury to back, bruising. Denies fevers, chills, nausea, vomiting, hematuria. Taking tylenol prn.   Relevant past medical, surgical, family and social history reviewed and updated as indicated. Interim medical history since our last visit reviewed. Allergies and medications reviewed and updated.  Review of Systems  Per HPI unless specifically indicated above     Objective:    BP 130/76 (BP Location: Left Arm, Patient Position: Sitting, Cuff Size: Normal)   Pulse 71   Temp 97.8 F (36.6 C)   Ht 5\' 4"  (1.626 m)   Wt 204 lb (92.5 kg)   SpO2 92%   BMI 35.02 kg/m   Wt Readings from Last 3 Encounters:  11/05/17 204 lb (92.5 kg)  07/03/17 201 lb 8 oz (91.4 kg)  05/16/17 202 lb (91.6 kg)    Physical Exam  Constitutional: She is oriented to person, place, and time. She appears well-developed and well-nourished.  HENT:  Head: Atraumatic.  Eyes: Pupils are equal, round, and reactive to light. Conjunctivae and EOM are normal.  Neck: Normal range of motion. Neck supple.  Cardiovascular: Normal rate and regular rhythm.  Pulmonary/Chest: Effort normal and breath sounds normal.  Abdominal: Soft. Bowel sounds are normal. She exhibits no distension. There is no tenderness.  Musculoskeletal: Normal range of motion. She exhibits tenderness (ttp b/l lumbar paraspinal muscles).  Neurological: She is alert and oriented to person, place, and time.  Skin: Skin is warm and dry.  Psychiatric: She has  a normal mood and affect. Her behavior is normal.  Nursing note and vitals reviewed.   Results for orders placed or performed in visit on 11/05/17  UA/M w/rflx Culture, Routine  Result Value Ref Range   Specific Gravity, UA 1.025 1.005 - 1.030   pH, UA 5.5 5.0 - 7.5   Color, UA Yellow Yellow   Appearance Ur Hazy (A) Clear   Leukocytes, UA Negative Negative   Protein, UA Negative Negative/Trace   Glucose, UA Negative Negative   Ketones, UA Negative Negative   RBC, UA Negative Negative   Bilirubin, UA Negative Negative   Urobilinogen, Ur 0.2 0.2 - 1.0 mg/dL   Nitrite, UA Negative Negative      Assessment & Plan:   Problem List Items Addressed This Visit    None    Visit Diagnoses    Acute back pain, unspecified back location, unspecified back pain laterality    -  Primary   U/A wNL, suspect muscle soreness. Heating pad, stretches, lidocaine patches, tylenol prn. F/u if worsening or not improving   Relevant Orders   UA/M w/rflx Culture, Routine (Completed)       Follow up plan: Return if symptoms worsen or fail to improve.

## 2017-11-08 NOTE — Patient Instructions (Signed)
Follow up as needed

## 2017-12-05 ENCOUNTER — Ambulatory Visit: Payer: Self-pay

## 2017-12-05 NOTE — Telephone Encounter (Signed)
OK to get shingrix vaccine series even if she had the zostavax years ago

## 2017-12-05 NOTE — Telephone Encounter (Signed)
Pt calling stating that her pharmacy called her and told her that she had the shingles 6-7 years ago and wanted to know if she wanted to get the injection while they have it in stock pt want to know if she can get it or does she need it. Looked at [employer] site [employer] and could not find the answer. Andee Lineman at PCP office and asked NT to route to office.  Pt updated that a provider will review and pt would be notified.  Reason for Disposition . [1] Caller requesting NON-URGENT health information AND [2] PCP's office is the best resource  Answer Assessment - Initial Assessment Questions 1. REASON FOR CALL or QUESTION: "What is your reason for calling today?" or "How can I best help you?" or "What question do you have that I can help answer?"  Pt calling stating that her pharmacy called her and told her that she had the shingles 6-7 years ago and wanted to know if she wanted to get the injection while they have it in stock pt want to know if she can get it or do she need it please  Protocols used: INFORMATION ONLY CALL-A-AH

## 2017-12-05 NOTE — Telephone Encounter (Signed)
Patient notified. RX signed and placed up front for patient to pick up and take to the pharmacy to get vaccine.

## 2017-12-18 ENCOUNTER — Telehealth: Payer: Self-pay | Admitting: Family Medicine

## 2017-12-18 NOTE — Telephone Encounter (Signed)
Looks like she sees Cardiology for her CHF and her lasix - she should call them to discuss this  Copied from [place] [id number]. Topic: General - Other >> Dec 18, 2017 11:25 AM Carolyn Stare wrote:  Pt call to say she has been on a fluid pill for years and is asking if she should be on one. She did say her ankles swear a little every now and then

## 2017-12-18 NOTE — Telephone Encounter (Signed)
Patient notified and verbalized understanding. 

## 2018-01-07 ENCOUNTER — Ambulatory Visit (INDEPENDENT_AMBULATORY_CARE_PROVIDER_SITE_OTHER): Payer: Medicare Other | Admitting: Family Medicine

## 2018-01-07 ENCOUNTER — Encounter: Payer: Self-pay | Admitting: Family Medicine

## 2018-01-07 VITALS — BP 140/80 | HR 67 | Temp 97.5°F | Ht 64.0 in | Wt 208.2 lb

## 2018-01-07 DIAGNOSIS — I83812 Varicose veins of left lower extremities with pain: Secondary | ICD-10-CM | POA: Diagnosis not present

## 2018-01-07 NOTE — Progress Notes (Signed)
BP 140/80 (BP Location: Left Arm, Patient Position: Sitting, Cuff Size: Normal)   Pulse 67   Temp (!) 97.5 F (36.4 C)   Ht 5\' 4"  (1.626 m)   Wt 208 lb 3 oz (94.4 kg)   SpO2 90%   BMI 35.74 kg/m    Subjective:    Patient ID: Maria Neal, female    DOB: 1950-09-09, 68 y.o.   MRN: [id number]  HPI: Maria Neal is a 68 y.o. female  Chief Complaint  Patient presents with  . Leg Pain    left X 1 month off and on, props leg up to sleep at night, patient would like to [facility]   Left leg pain from above knee posteriorly down to foot that has worsened the past month. Known significant varicose veins which she states are starting to ache quite a bit more. Using compression hose which does help and elevating leg at rest. Denies redness, point tenderness, chest pain, SOB, cough, recent surgery, long bouts of travel or sedentary behaviors. Former smoker.  Requesting referral to vascular specialist.   Relevant past medical, surgical, family and social history reviewed and updated as indicated. Interim medical history since our last visit reviewed. Allergies and medications reviewed and updated.  Review of Systems  Per HPI unless specifically indicated above     Objective:    BP 140/80 (BP Location: Left Arm, Patient Position: Sitting, Cuff Size: Normal)   Pulse 67   Temp (!) 97.5 F (36.4 C)   Ht 5\' 4"  (1.626 m)   Wt 208 lb 3 oz (94.4 kg)   SpO2 90%   BMI 35.74 kg/m   Wt Readings from Last 3 Encounters:  01/07/18 208 lb 3 oz (94.4 kg)  11/05/17 204 lb (92.5 kg)  07/03/17 201 lb 8 oz (91.4 kg)    Physical Exam Vitals signs and nursing note reviewed.  Constitutional:      Appearance: Normal appearance. She is not ill-appearing.  HENT:     Head: Atraumatic.  Eyes:     Extraocular Movements: Extraocular movements intact.     Conjunctiva/sclera: Conjunctivae normal.  Neck:     Musculoskeletal: Normal range of motion and neck supple.  Cardiovascular:   Rate and Rhythm: Normal rate and regular rhythm.     Pulses: Normal pulses.     Heart sounds: Normal heart sounds.     Comments: Significant varicose veins LLE, mildly ttp  Pulmonary:     Effort: Pulmonary effort is normal.     Breath sounds: Normal breath sounds.  Musculoskeletal: Normal range of motion.        General: No signs of injury.     Comments: Trace edema LLE Neg squeeze test and homan's sign LLE Minimally ttp diffusely from posterior knee down  Skin:    General: Skin is warm and dry.  Neurological:     Mental Status: She is alert and oriented to person, place, and time.  Psychiatric:        Mood and Affect: Mood normal.        Thought Content: Thought content normal.        Judgment: Judgment normal.     Results for orders placed or performed in visit on 11/05/17  UA/M w/rflx Culture, Routine  Result Value Ref Range   Specific Gravity, UA 1.025 1.005 - 1.030   pH, UA 5.5 5.0 - 7.5   Color, UA Yellow Yellow   Appearance Ur Hazy (A) Clear  Leukocytes, UA Negative Negative   Protein, UA Negative Negative/Trace   Glucose, UA Negative Negative   Ketones, UA Negative Negative   RBC, UA Negative Negative   Bilirubin, UA Negative Negative   Urobilinogen, Ur 0.2 0.2 - 1.0 mg/dL   Nitrite, UA Negative Negative      Assessment & Plan:   Problem List Items Addressed This Visit    None    Visit Diagnoses    Varicose veins of left lower extremity with pain    -  Primary   Continue elevation, compression, exercise. Vascular referral placed. Discussed dvt r/o, low suspicion and pt wishes to hold off for now   Relevant Orders   Ambulatory referral to Vascular Surgery       Follow up plan: Return for as scheduled.

## 2018-01-28 ENCOUNTER — Encounter (INDEPENDENT_AMBULATORY_CARE_PROVIDER_SITE_OTHER): Payer: Self-pay | Admitting: Vascular Surgery

## 2018-02-07 ENCOUNTER — Encounter (INDEPENDENT_AMBULATORY_CARE_PROVIDER_SITE_OTHER): Payer: Self-pay | Admitting: Vascular Surgery

## 2018-02-17 ENCOUNTER — Emergency Department: Payer: Medicare Other

## 2018-02-17 ENCOUNTER — Other Ambulatory Visit: Payer: Self-pay

## 2018-02-17 ENCOUNTER — Encounter
Admission: EM | Disposition: A | Discharge status: Home / Self Care | Payer: Self-pay | Source: Home / Self Care | Attending: Emergency Medicine

## 2018-02-17 ENCOUNTER — Encounter: Payer: Self-pay | Admitting: Emergency Medicine

## 2018-02-17 ENCOUNTER — Observation Stay: Payer: Medicare Other | Admitting: Anesthesiology

## 2018-02-17 ENCOUNTER — Observation Stay
Admission: EM | Admit: 2018-02-17 | Discharge: 2018-02-18 | Disposition: A | Discharge status: Home / Self Care | Payer: Medicare Other | Attending: Surgery | Admitting: Surgery

## 2018-02-17 DIAGNOSIS — Z9981 Dependence on supplemental oxygen: Secondary | ICD-10-CM | POA: Insufficient documentation

## 2018-02-17 DIAGNOSIS — K37 Unspecified appendicitis: Secondary | ICD-10-CM

## 2018-02-17 DIAGNOSIS — Z87891 Personal history of nicotine dependence: Secondary | ICD-10-CM | POA: Diagnosis not present

## 2018-02-17 DIAGNOSIS — Z880 Allergy status to penicillin: Secondary | ICD-10-CM | POA: Insufficient documentation

## 2018-02-17 DIAGNOSIS — Z7982 Long term (current) use of aspirin: Secondary | ICD-10-CM | POA: Insufficient documentation

## 2018-02-17 DIAGNOSIS — K3580 Unspecified acute appendicitis: Secondary | ICD-10-CM | POA: Diagnosis present

## 2018-02-17 DIAGNOSIS — Z888 Allergy status to other drugs, medicaments and biological substances status: Secondary | ICD-10-CM | POA: Diagnosis not present

## 2018-02-17 DIAGNOSIS — I428 Other cardiomyopathies: Secondary | ICD-10-CM | POA: Diagnosis not present

## 2018-02-17 DIAGNOSIS — Z95810 Presence of automatic (implantable) cardiac defibrillator: Secondary | ICD-10-CM | POA: Diagnosis not present

## 2018-02-17 DIAGNOSIS — Z881 Allergy status to other antibiotic agents status: Secondary | ICD-10-CM | POA: Diagnosis not present

## 2018-02-17 DIAGNOSIS — I110 Hypertensive heart disease with heart failure: Secondary | ICD-10-CM | POA: Diagnosis not present

## 2018-02-17 DIAGNOSIS — G2581 Restless legs syndrome: Secondary | ICD-10-CM | POA: Diagnosis not present

## 2018-02-17 DIAGNOSIS — J449 Chronic obstructive pulmonary disease, unspecified: Secondary | ICD-10-CM | POA: Insufficient documentation

## 2018-02-17 DIAGNOSIS — R9389 Abnormal findings on diagnostic imaging of other specified body structures: Secondary | ICD-10-CM

## 2018-02-17 DIAGNOSIS — N939 Abnormal uterine and vaginal bleeding, unspecified: Secondary | ICD-10-CM

## 2018-02-17 DIAGNOSIS — K3533 Acute appendicitis with perforation and localized peritonitis, with abscess: Secondary | ICD-10-CM | POA: Diagnosis not present

## 2018-02-17 DIAGNOSIS — I5022 Chronic systolic (congestive) heart failure: Secondary | ICD-10-CM | POA: Insufficient documentation

## 2018-02-17 DIAGNOSIS — N950 Postmenopausal bleeding: Secondary | ICD-10-CM | POA: Diagnosis not present

## 2018-02-17 DIAGNOSIS — R109 Unspecified abdominal pain: Secondary | ICD-10-CM

## 2018-02-17 DIAGNOSIS — N840 Polyp of corpus uteri: Secondary | ICD-10-CM | POA: Diagnosis not present

## 2018-02-17 DIAGNOSIS — I2510 Atherosclerotic heart disease of native coronary artery without angina pectoris: Secondary | ICD-10-CM | POA: Diagnosis not present

## 2018-02-17 DIAGNOSIS — N83201 Unspecified ovarian cyst, right side: Secondary | ICD-10-CM

## 2018-02-17 DIAGNOSIS — Z79899 Other long term (current) drug therapy: Secondary | ICD-10-CM | POA: Insufficient documentation

## 2018-02-17 DIAGNOSIS — F418 Other specified anxiety disorders: Secondary | ICD-10-CM | POA: Diagnosis not present

## 2018-02-17 DIAGNOSIS — R0902 Hypoxemia: Secondary | ICD-10-CM

## 2018-02-17 HISTORY — DX: Unspecified acute appendicitis: K35.80

## 2018-02-17 HISTORY — PX: LAPAROSCOPIC APPENDECTOMY: SHX408

## 2018-02-17 LAB — COMPREHENSIVE METABOLIC PANEL
ALT: 16 U/L (ref 0–44)
AST: 24 U/L (ref 15–41)
Albumin: 4.5 g/dL (ref 3.5–5.0)
Alkaline Phosphatase: 73 U/L (ref 38–126)
Anion gap: 8 (ref 5–15)
BUN: 11 mg/dL (ref 8–23)
CHLORIDE: 100 mmol/L (ref 98–111)
CO2: 31 mmol/L (ref 22–32)
CREATININE: 0.81 mg/dL (ref 0.44–1.00)
Calcium: 9.1 mg/dL (ref 8.9–10.3)
GFR calc Af Amer: >60 mL/min (ref >60)
GFR calc non Af Amer: >60 mL/min (ref >60)
GLUCOSE: 128 mg/dL — AB (ref 70–99)
Potassium: 4.9 mmol/L (ref 3.5–5.1)
SODIUM: 139 mmol/L (ref 135–145)
Total Bilirubin: 1.3 mg/dL — ABNORMAL HIGH (ref 0.3–1.2)
Total Protein: 7.7 g/dL (ref 6.5–8.1)

## 2018-02-17 LAB — URINALYSIS, ROUTINE W REFLEX MICROSCOPIC
Bacteria, UA: NONE SEEN
Bilirubin Urine: NEGATIVE
Glucose, UA: NEGATIVE mg/dL
Ketones, ur: NEGATIVE mg/dL
Leukocytes,Ua: NEGATIVE
Nitrite: NEGATIVE
PROTEIN: NEGATIVE mg/dL
SPECIFIC GRAVITY, URINE: 1.013 (ref 1.005–1.030)
pH: 7 (ref 5.0–8.0)

## 2018-02-17 LAB — CBC WITH DIFFERENTIAL/PLATELET
Abs Immature Granulocytes: 0.02 10*3/uL (ref 0.00–0.07)
BASOS PCT: 1 %
Basophils Absolute: 0.1 10*3/uL (ref 0.0–0.1)
Eosinophils Absolute: 0.3 10*3/uL (ref 0.0–0.5)
Eosinophils Relative: 5 %
HCT: 43.8 % (ref 36.0–46.0)
HEMOGLOBIN: 13.8 g/dL (ref 12.0–15.0)
Immature Granulocytes: 0 %
LYMPHS PCT: 31 %
Lymphs Abs: 2.2 10*3/uL (ref 0.7–4.0)
MCH: 28.1 pg (ref 26.0–34.0)
MCHC: 31.5 g/dL (ref 30.0–36.0)
MCV: 89.2 fL (ref 80.0–100.0)
MONO ABS: 0.6 10*3/uL (ref 0.1–1.0)
Monocytes Relative: 8 %
Neutro Abs: 4 10*3/uL (ref 1.7–7.7)
Neutrophils Relative %: 55 %
Platelets: 228 10*3/uL (ref 150–400)
RBC: 4.91 MIL/uL (ref 3.87–5.11)
RDW: 15 % (ref 11.5–15.5)
WBC: 7.2 10*3/uL (ref 4.0–10.5)
nRBC: 0 % (ref 0.0–0.2)

## 2018-02-17 LAB — SURGICAL PCR SCREEN
MRSA, PCR: POSITIVE — AB
Staphylococcus aureus: POSITIVE — AB

## 2018-02-17 LAB — LIPASE, BLOOD: LIPASE: 29 U/L (ref 11–51)

## 2018-02-17 SURGERY — EXAM UNDER ANESTHESIA
Anesthesia: General | Site: Vagina

## 2018-02-17 MED ORDER — HYDROCODONE-ACETAMINOPHEN 5-325 MG PO TABS: 1.0000 | ORAL_TABLET | ORAL | Status: DC | PRN

## 2018-02-17 MED ORDER — ONDANSETRON HCL 4 MG/2ML IJ SOLN: 4.0000 mg | Freq: Four times a day (QID) | INTRAMUSCULAR | Status: DC | PRN

## 2018-02-17 MED ORDER — LIDOCAINE HCL (PF) 2 % IJ SOLN
INTRAMUSCULAR | Status: AC
  Filled 2018-02-17: qty 10

## 2018-02-17 MED ORDER — SUCCINYLCHOLINE CHLORIDE 20 MG/ML IJ SOLN
INTRAMUSCULAR | Status: AC
  Filled 2018-02-17: qty 1

## 2018-02-17 MED ORDER — ONDANSETRON HCL 4 MG/2ML IJ SOLN
INTRAMUSCULAR | Status: DC | PRN
  Administered 2018-02-17: 4 mg via INTRAVENOUS

## 2018-02-17 MED ORDER — BUPIVACAINE HCL (PF) 0.5 % IJ SOLN
INTRAMUSCULAR | Status: AC
  Filled 2018-02-17: qty 30

## 2018-02-17 MED ORDER — DICYCLOMINE HCL 10 MG/5ML PO SOLN
10.0000 mg | Freq: Once | ORAL | Status: DC
  Filled 2018-02-17 (×2): qty 5

## 2018-02-17 MED ORDER — LIDOCAINE-EPINEPHRINE (PF) 1 %-1:200000 IJ SOLN
INTRAMUSCULAR | Status: DC | PRN
  Administered 2018-02-17: 10 mL

## 2018-02-17 MED ORDER — MORPHINE SULFATE (PF) 2 MG/ML IV SOLN: 2.0000 mg | INTRAVENOUS | Status: DC | PRN

## 2018-02-17 MED ORDER — DEXMEDETOMIDINE HCL 200 MCG/2ML IV SOLN
INTRAVENOUS | Status: DC | PRN
  Administered 2018-02-17: 8 ug via INTRAVENOUS

## 2018-02-17 MED ORDER — PHENYLEPHRINE HCL 10 MG/ML IJ SOLN
INTRAMUSCULAR | Status: DC | PRN
  Administered 2018-02-17: 100 ug via INTRAVENOUS

## 2018-02-17 MED ORDER — LIDOCAINE VISCOUS HCL 2 % MT SOLN
15.0000 mL | Freq: Once | OROMUCOSAL | Status: DC
  Filled 2018-02-17 (×2): qty 15

## 2018-02-17 MED ORDER — VASOPRESSIN 20 UNIT/ML IV SOLN
INTRAVENOUS | Status: AC
  Filled 2018-02-17: qty 1

## 2018-02-17 MED ORDER — ETOMIDATE 2 MG/ML IV SOLN
INTRAVENOUS | Status: AC
  Filled 2018-02-17: qty 10

## 2018-02-17 MED ORDER — SUGAMMADEX SODIUM 200 MG/2ML IV SOLN
INTRAVENOUS | Status: DC | PRN
  Administered 2018-02-17: 200 mg via INTRAVENOUS

## 2018-02-17 MED ORDER — SODIUM CHLORIDE (PF) 0.9 % IJ SOLN
INTRAMUSCULAR | Status: AC
  Filled 2018-02-17: qty 10

## 2018-02-17 MED ORDER — SODIUM CHLORIDE 0.9 % IV SOLN
1.0000 g | Freq: Three times a day (TID) | INTRAVENOUS | Status: DC
  Administered 2018-02-17: 1 g via INTRAVENOUS
  Filled 2018-02-17 (×3): qty 1

## 2018-02-17 MED ORDER — TRAMADOL HCL 50 MG PO TABS: 50.0000 mg | ORAL_TABLET | Freq: Four times a day (QID) | ORAL | Status: DC | PRN

## 2018-02-17 MED ORDER — IOPAMIDOL (ISOVUE-300) INJECTION 61%
30.0000 mL | Freq: Once | INTRAVENOUS | Status: AC | PRN
  Administered 2018-02-17: 30 mL via ORAL

## 2018-02-17 MED ORDER — ONDANSETRON HCL 4 MG/2ML IJ SOLN: 4.0000 mg | Freq: Once | INTRAMUSCULAR | Status: DC | PRN

## 2018-02-17 MED ORDER — ETOMIDATE 2 MG/ML IV SOLN
INTRAVENOUS | Status: DC | PRN
  Administered 2018-02-17: 18 mg via INTRAVENOUS

## 2018-02-17 MED ORDER — MIDAZOLAM HCL 2 MG/2ML IJ SOLN
INTRAMUSCULAR | Status: AC
  Filled 2018-02-17: qty 2

## 2018-02-17 MED ORDER — DOCUSATE SODIUM 100 MG PO CAPS: 100.0000 mg | ORAL_CAPSULE | Freq: Two times a day (BID) | ORAL | Status: DC | PRN

## 2018-02-17 MED ORDER — LACTATED RINGERS IV SOLN
INTRAVENOUS | Status: DC | PRN
  Administered 2018-02-17: 21:00:00 via INTRAVENOUS

## 2018-02-17 MED ORDER — FENTANYL CITRATE (PF) 100 MCG/2ML IJ SOLN
INTRAMUSCULAR | Status: DC | PRN
  Administered 2018-02-17 (×2): 50 ug via INTRAVENOUS

## 2018-02-17 MED ORDER — ETOMIDATE 2 MG/ML IV SOLN: INTRAVENOUS | Status: DC | PRN

## 2018-02-17 MED ORDER — LIDOCAINE-EPINEPHRINE (PF) 1 %-1:200000 IJ SOLN
INTRAMUSCULAR | Status: AC
  Filled 2018-02-17: qty 30

## 2018-02-17 MED ORDER — ROCURONIUM BROMIDE 50 MG/5ML IV SOLN
INTRAVENOUS | Status: AC
  Filled 2018-02-17: qty 1

## 2018-02-17 MED ORDER — ACETAMINOPHEN 10 MG/ML IV SOLN
INTRAVENOUS | Status: DC | PRN
  Administered 2018-02-17: 1000 mg via INTRAVENOUS

## 2018-02-17 MED ORDER — BUPIVACAINE-EPINEPHRINE 0.5% -1:200000 IJ SOLN
INTRAMUSCULAR | Status: DC | PRN
  Administered 2018-02-17: 10 mL

## 2018-02-17 MED ORDER — ALUM & MAG HYDROXIDE-SIMETH 200-200-20 MG/5ML PO SUSP
30.0000 mL | Freq: Once | ORAL | Status: DC
  Filled 2018-02-17 (×2): qty 30

## 2018-02-17 MED ORDER — PROPOFOL 10 MG/ML IV BOLUS
INTRAVENOUS | Status: AC
  Filled 2018-02-17: qty 20

## 2018-02-17 MED ORDER — ONDANSETRON HCL 4 MG/2ML IJ SOLN
INTRAMUSCULAR | Status: AC
  Filled 2018-02-17: qty 2

## 2018-02-17 MED ORDER — ONDANSETRON 4 MG PO TBDP: 4.0000 mg | ORAL_TABLET | Freq: Four times a day (QID) | ORAL | Status: DC | PRN

## 2018-02-17 MED ORDER — MUPIROCIN 2 % EX OINT
1.0000 "application " | TOPICAL_OINTMENT | Freq: Two times a day (BID) | CUTANEOUS | Status: DC
  Administered 2018-02-18: 1 via NASAL
  Filled 2018-02-17: qty 22

## 2018-02-17 MED ORDER — FENTANYL CITRATE (PF) 100 MCG/2ML IJ SOLN
25.0000 ug | INTRAMUSCULAR | Status: DC | PRN
  Administered 2018-02-17 – 2018-02-18 (×3): 25 ug via INTRAVENOUS

## 2018-02-17 MED ORDER — DEXAMETHASONE SODIUM PHOSPHATE 10 MG/ML IJ SOLN
INTRAMUSCULAR | Status: AC
  Filled 2018-02-17: qty 1

## 2018-02-17 MED ORDER — EPHEDRINE SULFATE 50 MG/ML IJ SOLN
INTRAMUSCULAR | Status: DC | PRN
  Administered 2018-02-17: 20 mg via INTRAVENOUS

## 2018-02-17 MED ORDER — ACETAMINOPHEN 10 MG/ML IV SOLN
INTRAVENOUS | Status: AC
  Filled 2018-02-17: qty 100

## 2018-02-17 MED ORDER — FENTANYL CITRATE (PF) 100 MCG/2ML IJ SOLN
INTRAMUSCULAR | Status: AC
  Filled 2018-02-17: qty 2

## 2018-02-17 MED ORDER — LIDOCAINE HCL (CARDIAC) PF 100 MG/5ML IV SOSY
PREFILLED_SYRINGE | INTRAVENOUS | Status: DC | PRN
  Administered 2018-02-17: 60 mg via INTRAVENOUS

## 2018-02-17 MED ORDER — IOHEXOL 300 MG/ML  SOLN
100.0000 mL | Freq: Once | INTRAMUSCULAR | Status: AC | PRN
  Administered 2018-02-17: 100 mL via INTRAVENOUS

## 2018-02-17 MED ORDER — FENTANYL CITRATE (PF) 100 MCG/2ML IJ SOLN
INTRAMUSCULAR | Status: AC
  Administered 2018-02-17: 25 ug via INTRAVENOUS
  Filled 2018-02-17: qty 2

## 2018-02-17 MED ORDER — ROCURONIUM BROMIDE 100 MG/10ML IV SOLN
INTRAVENOUS | Status: DC | PRN
  Administered 2018-02-17: 5 mg via INTRAVENOUS
  Administered 2018-02-17: 25 mg via INTRAVENOUS

## 2018-02-17 MED ORDER — VASOPRESSIN 20 UNIT/ML IV SOLN
INTRAVENOUS | Status: DC | PRN
  Administered 2018-02-17: 2 [IU] via INTRAVENOUS

## 2018-02-17 MED ORDER — ROCURONIUM BROMIDE 100 MG/10ML IV SOLN: INTRAVENOUS | Status: DC | PRN

## 2018-02-17 MED ORDER — SUCCINYLCHOLINE CHLORIDE 20 MG/ML IJ SOLN
INTRAMUSCULAR | Status: DC | PRN
  Administered 2018-02-17: 100 mg via INTRAVENOUS

## 2018-02-17 MED ORDER — DEXAMETHASONE SODIUM PHOSPHATE 10 MG/ML IJ SOLN
INTRAMUSCULAR | Status: DC | PRN
  Administered 2018-02-17: 5 mg via INTRAVENOUS

## 2018-02-17 MED ORDER — MIDAZOLAM HCL 5 MG/5ML IJ SOLN
INTRAMUSCULAR | Status: DC | PRN
  Administered 2018-02-17: 2 mg via INTRAVENOUS

## 2018-02-17 SURGICAL SUPPLY — 49 items
APPLICATOR COTTON TIP 6 STRL (MISCELLANEOUS) ×3 IMPLANT
APPLICATOR COTTON TIP 6IN STRL (MISCELLANEOUS) ×4
APPLIER CLIP LOGIC TI 5 (MISCELLANEOUS) ×4 IMPLANT
BLADE SURG SZ11 CARB STEEL (BLADE) ×4 IMPLANT
CANISTER SUCT 1200ML W/VALVE (MISCELLANEOUS) ×4 IMPLANT
COVER WAND RF STERILE (DRAPES) IMPLANT
CUTTER FLEX LINEAR 45M (STAPLE) ×4 IMPLANT
DECANTER SPIKE VIAL GLASS SM (MISCELLANEOUS) ×8 IMPLANT
DERMABOND ADVANCED (GAUZE/BANDAGES/DRESSINGS) ×1
DERMABOND ADVANCED .7 DNX12 (GAUZE/BANDAGES/DRESSINGS) ×3 IMPLANT
DRAPE LEGGINS SURG 28X43 STRL (DRAPES) ×4 IMPLANT
DRSG TELFA 3X8 NADH (GAUZE/BANDAGES/DRESSINGS) ×4 IMPLANT
ELECT REM PT RETURN 9FT ADLT (ELECTROSURGICAL) ×4
ELECTRODE REM PT RTRN 9FT ADLT (ELECTROSURGICAL) ×3 IMPLANT
GLOVE BIOGEL PI IND STRL 7.0 (GLOVE) ×6 IMPLANT
GLOVE BIOGEL PI INDICATOR 7.0 (GLOVE) ×2
GLOVE SURG SYN 6.5 ES PF (GLOVE) ×8 IMPLANT
GLOVE SURG SYN 7.0 (GLOVE) ×8 IMPLANT
GOWN STRL REUS W/ TWL LRG LVL3 (GOWN DISPOSABLE) ×3 IMPLANT
GOWN STRL REUS W/TWL LRG LVL3 (GOWN DISPOSABLE) ×1
GRASPER SUT TROCAR 14GX15 (MISCELLANEOUS) ×4 IMPLANT
HANDLE YANKAUER SUCT BULB TIP (MISCELLANEOUS) IMPLANT
IRRIGATION STRYKERFLOW (MISCELLANEOUS) ×3 IMPLANT
IRRIGATOR STRYKERFLOW (MISCELLANEOUS) ×4
JELLY LUB 2OZ STRL (MISCELLANEOUS) ×1
JELLY LUBE 2OZ STRL (MISCELLANEOUS) ×3 IMPLANT
KIT TURNOVER KIT A (KITS) ×4 IMPLANT
LIGASURE LAP MARYLAND 5MM 37CM (ELECTROSURGICAL) ×4 IMPLANT
MINICAP W/POVIDONE IODINE SOL (MISCELLANEOUS) ×4 IMPLANT
NEEDLE HYPO 22GX1.5 SAFETY (NEEDLE) ×8 IMPLANT
NEEDLE VERESS 14GA 120MM (NEEDLE) ×4 IMPLANT
PACK LAP CHOLECYSTECTOMY (MISCELLANEOUS) ×4 IMPLANT
PENCIL ELECTRO HAND CTR (MISCELLANEOUS) ×4 IMPLANT
POUCH ENDO CATCH 10MM SPEC (MISCELLANEOUS) ×4 IMPLANT
RELOAD 45 VASCULAR/THIN (ENDOMECHANICALS) IMPLANT
RELOAD STAPLE TA45 3.5 REG BLU (ENDOMECHANICALS) ×4 IMPLANT
SCISSORS METZENBAUM CVD 33 (INSTRUMENTS) ×4 IMPLANT
SET TUBE SMOKE EVAC HIGH FLOW (TUBING) ×4 IMPLANT
SLEEVE ENDOPATH XCEL 5M (ENDOMECHANICALS) ×4 IMPLANT
SUT MNCRL AB 4-0 PS2 18 (SUTURE) ×8 IMPLANT
SUT VIC AB 3-0 SH 27 (SUTURE) ×1
SUT VIC AB 3-0 SH 27X BRD (SUTURE) ×3 IMPLANT
SUT VICRYL 0 AB UR-6 (SUTURE) ×8 IMPLANT
SUT VICRYL PLUS ABS 0 54 (SUTURE) ×4 IMPLANT
TRAY FOLEY MTR SLVR 16FR STAT (SET/KITS/TRAYS/PACK) ×4 IMPLANT
TROCAR HASSON CONE 8 XI (INSTRUMENTS) ×3 IMPLANT
TROCAR HASSON CONE XI 8MM (INSTRUMENTS) ×1
TROCAR XCEL 12X100 BLDLESS (ENDOMECHANICALS) ×4 IMPLANT
TROCAR XCEL NON-BLD 5MMX100MML (ENDOMECHANICALS) ×4 IMPLANT

## 2018-02-17 NOTE — ED Notes (Addendum)
Pt placed on 2L via n/c for O2 sat 87% on RA. Sat increased to 94%. Hx COPD, pt states this is baseline for her.

## 2018-02-17 NOTE — ED Notes (Signed)
Pt states she does not want to drink the gi cocktail. Is currently drinking the contrast.

## 2018-02-17 NOTE — Anesthesia Preprocedure Evaluation (Addendum)
Anesthesia Evaluation  Patient identified by MRN, date of birth, ID band Patient awake    Reviewed: Allergy & Precautions, NPO status , Patient's Chart, lab work & pertinent test results  History of Anesthesia Complications Negative for: history of anesthetic complications  Airway Mallampati: II  TM Distance: >3 FB Neck ROM: Full    Dental  (+) Poor Dentition, Missing, Edentulous Upper   Pulmonary shortness of breath, neg sleep apnea, COPD,  COPD inhaler and oxygen dependent, former smoker,    breath sounds clear to auscultation- rhonchi (-) wheezing      Cardiovascular hypertension, +CHF (NICM, EF 20%)  (-) CAD, (-) Past MI and (-) Cardiac Stents Pacemaker: BiV ICD. + Cardiac Defibrillator  Rhythm:Regular Rate:Normal - Systolic murmurs and - Diastolic murmurs Echo 10/07/24: SEVERE LV SYSTOLIC DYSFUNCTION (See above) NORMAL RIGHT VENTRICULAR SYSTOLIC FUNCTION MODERATE VALVULAR REGURGITATION (See above) NO VALVULAR STENOSIS MILD PHTN EF 20%   Neuro/Psych  Headaches, PSYCHIATRIC DISORDERS Anxiety Depression    GI/Hepatic negative GI ROS, Neg liver ROS,   Endo/Other  negative endocrine ROSneg diabetes  Renal/GU negative Renal ROS     Musculoskeletal negative musculoskeletal ROS (+)   Abdominal (+) + obese,   Peds  Hematology negative hematology ROS (+)   Anesthesia Other Findings Past Medical History: No date: AICD (automatic cardioverter/defibrillator) present No date: Anxiety No date: CAD (coronary artery disease) No date: Cardiomyopathy ([place]) No date: CHF (congestive heart failure) (HCC) No date: COPD (chronic obstructive pulmonary disease) (HCC) No date: Depression No date: Dyspnea No date: Headache No date: History of shingles No date: Hypertension No date: Insomnia No date: On home oxygen therapy     Comment:  bedtime and prn No date: Restless leg syndrome  EF 20-25%  Reproductive/Obstetrics                             Anesthesia Physical  Anesthesia Plan  ASA: IV  Anesthesia Plan: General   Post-op Pain Management:    Induction: Intravenous  PONV Risk Score and Plan: 2 and Ondansetron and Dexamethasone  Airway Management Planned: Oral ETT  Additional Equipment:   Intra-op Plan:   Post-operative Plan: Extubation in OR and Possible Post-op intubation/ventilation  Informed Consent: I have reviewed the patients History and Physical, chart, labs and discussed the procedure including the risks, benefits and alternatives for the proposed anesthesia with the patient or authorized representative who has indicated his/her understanding and acceptance.     Dental advisory given  Plan Discussed with: CRNA and Anesthesiologist  Anesthesia Plan Comments: (Discussed possible need for postop ventilation given hx of severe COPD and oxygen dependence  Plan to use magnet during procedure to inhibit sensing function, per [employer] rep function will return to normal when magnet is removed, will not need postop reprogramming)        Anesthesia Quick Evaluation

## 2018-02-17 NOTE — Progress Notes (Signed)
Pharmacy Antibiotic Note  Maria Neal is a 68 y.o. female admitted on 02/17/2018 with Intra-abdominal infection.  Pharmacy has been consulted for Meropenem dosing.  Plan: Meropenem 1g IV q8h  Height: 5\' 4"  (162.6 cm) Weight: 200 lb (90.7 kg) IBW/kg (Calculated) : 54.7  Temp (24hrs), Avg:97.7 F (36.5 C), Min:97.5 F (36.4 C), Max:98 F (36.7 C)  Recent Labs  Lab 02/17/18 1111  WBC 7.2  CREATININE 0.81    Estimated Creatinine Clearance: 73.5 mL/min (by C-G formula based on SCr of 0.81 mg/dL).    Allergies  Allergen Reactions  . Levaquin [Levofloxacin] Anaphylaxis  . Amoxicillin Hives  . Nitrofuran Derivatives Other (See Comments)    Reaction: unknown  . Penicillins Other (See Comments)    Thinks it made her itch a lot, but isn't sure.  Marland Kitchen Zithromax [Azithromycin] Other (See Comments)  . Antihistamines, Chlorpheniramine-Type Rash    Antimicrobials this admission: Meropenem 2/16 >>   Thank you for allowing pharmacy to be a part of this patient's care.  Paulina Fusi, PharmD, BCPS 02/17/2018 8:10 PM

## 2018-02-17 NOTE — Transfer of Care (Signed)
Immediate Anesthesia Transfer of Care Note  Patient: Maria Neal  Procedure(s) Performed: EXAM UNDER ANESTHESIA with endometrial biopsy (Vagina ) APPENDECTOMY LAPAROSCOPIC (N/A Abdomen)  Patient Location: PACU  Anesthesia Type:General  Level of Consciousness: awake and patient cooperative  Airway & Oxygen Therapy: Patient Spontanous Breathing and Patient connected to face mask oxygen  Post-op Assessment: Report given to RN and Post -op Vital signs reviewed and stable  Post vital signs: Reviewed and stable  Last Vitals:  Vitals Value Taken Time  BP 131/61 02/17/2018 11:18 PM  Temp    Pulse 70 02/17/2018 11:23 PM  Resp 18 02/17/2018 11:23 PM  SpO2 97 % 02/17/2018 11:23 PM  Vitals shown include unvalidated device data.  Last Pain:  Vitals:   02/17/18 2003  TempSrc: Oral  PainSc:          Complications: No apparent anesthesia complications

## 2018-02-17 NOTE — Anesthesia Post-op Follow-up Note (Signed)
Anesthesia QCDR form completed.        

## 2018-02-17 NOTE — ED Provider Notes (Signed)
[facility] Emergency Department Provider Note   ____________________________________________   First MD Initiated Contact with Patient 02/17/18 1403     (approximate)  I have reviewed the triage vital signs and the nursing notes.   HISTORY  Chief Complaint Vaginal Bleeding    HPI Maria Neal is a 68 y.o. female comes in complaining of epigastric and right mid abdominal pain for about 3 days.  She is also having spotting for about 3 days.  Her last menstrual period was she thinks about 20 years ago.  Pain she is having is worse with palpation worse with eating worse with movement it is moderate to moderately severe sharp in nature.   Past Medical History:  Diagnosis Date  . AICD (automatic cardioverter/defibrillator) present   . Anxiety   . CAD (coronary artery disease)   . Cardiomyopathy ([place])   . CHF (congestive heart failure) ([place])   . COPD (chronic obstructive pulmonary disease) ([place])   . Depression   . Dyspnea   . Headache   . History of shingles   . Hypertension   . Insomnia   . On home oxygen therapy    bedtime and prn  . Restless leg syndrome     Patient Active Problem List   Diagnosis Date Noted  . Hypercholesteremia 05/02/2017  . Obesity (BMI 30-39.9) 05/02/2017  . Mitral valve insufficiency 04/16/2017  . Closed fracture of lateral malleolus 03/08/2017  . Biliary colic 41/32/4401  . Chronic systolic congestive heart failure, NYHA class 3 ([place]) 08/07/2016  . NICM (nonischemic cardiomyopathy) ([place]) 08/07/2016  . Bundle branch block, left 10/02/2014  . Benign essential HTN 05/07/2014  . CAD (coronary artery disease) 05/07/2014  . COPD (chronic obstructive pulmonary disease) ([place]) 05/07/2014  . S/P cardiac catheterization 04/23/2014  . Calculus of gallbladder with cholecystitis without biliary obstruction 03/31/2014  . SOB (shortness of breath) on exertion 11/06/2013    Past Surgical History:  Procedure Laterality Date  .  CARDIAC CATHETERIZATION  04/2014   Dr. Idelle Leech  . CARDIAC CATHETERIZATION    . CARDIAC DEFIBRILLATOR PLACEMENT    . CHOLECYSTECTOMY N/A 08/23/2016   Procedure: LAPAROSCOPIC CHOLECYSTECTOMY;  Surgeon: Clayburn Pert, MD;  Location: [facility] ORS;  Service: General;  Laterality: N/A;  . HERNIA REPAIR      Prior to Admission medications   Medication Sig Start Date End Date Taking? Authorizing Provider  acetaminophen (TYLENOL) 500 MG tablet Take 500-1,000 mg by mouth every 8 (eight) hours as needed for mild pain or headache.     [provider]  aspirin EC 81 MG tablet Take 81 mg by mouth daily.    [provider]  atorvastatin (LIPITOR) 20 MG tablet Take 1 tablet (20 mg total) by mouth daily. 05/02/17   Kathrine Haddock, NP  carvedilol (COREG) 25 MG tablet Take 25 mg by mouth 2 (two) times daily. 04/12/17   [provider]  losartan (COZAAR) 50 MG tablet Take 50 mg by mouth daily. 04/10/17   [provider]  nystatin (MYCOSTATIN/NYSTOP) powder APPLY TOPICALLY 4 TIMES DAILY 11/05/17   Volney American, PA-C  nystatin cream (MYCOSTATIN) Apply 1 application topically 2 (two) times daily. 07/03/17   Volney American, PA-C  OXYGEN Inhale 2 L into the lungs daily.    [provider]  spironolactone (ALDACTONE) 25 MG tablet Take 12.5 mg by mouth daily. 04/09/17   [provider]    Allergies Levaquin [levofloxacin]; Amoxicillin; Nitrofuran derivatives; Penicillins; Zithromax [azithromycin]; and Antihistamines, chlorpheniramine-type  Family History  Problem Relation Age of Onset  . Diabetes Mellitus II Mother   . Hypertension Mother   . Heart attack Father   . Heart disease Brother   . Arthritis Sister   . Depression Daughter   . Depression Son   . Schizophrenia Son   . Arthritis Sister   . Diabetes Brother     Social History Social History   Tobacco Use  . Smoking status: Former Smoker    Packs/day: 0.25    Types: Cigarettes  .  Smokeless tobacco: Never Used  . Tobacco comment: quit at age 29  Substance Use Topics  . Alcohol use: No    Alcohol/week: 0.0 standard drinks  . Drug use: No    Review of Systems  Constitutional: No fever/chills Eyes: No visual changes. ENT: No sore throat. Cardiovascular: Denies chest pain. Respiratory: Denies shortness of breath. Gastrointestinal: No abdominal pain.  No nausea, no vomiting.  No diarrhea.  No constipation. Genitourinary: Negative for dysuria. Musculoskeletal: Negative for back pain. Skin: Negative for rash. . Neurology: Normal mental status no obvious focal neurological signs are seen  ____________________________________________   PHYSICAL EXAM:  VITAL SIGNS: ED Triage Vitals  Enc Vitals Group     BP 02/17/18 1103 129/65     Pulse Rate 02/17/18 1103 78     Resp 02/17/18 1103 18     Temp 02/17/18 1102 (!) 97.5 F (36.4 C)     Temp Source 02/17/18 1102 Oral     SpO2 02/17/18 1103 (!) 87 %     Weight 02/17/18 1110 200 lb (90.7 kg)     Height 02/17/18 1110 5\' 4"  (1.626 m)     Head Circumference --      Peak Flow --      Pain Score 02/17/18 1109 9     Pain Loc --      Pain Edu? --      Excl. in [place]? --     Constitutional: Alert and oriented. Well appearing and in no acute distress. Eyes: Conjunctivae are normal.  Head: Atraumatic. Nose: No congestion/rhinnorhea. Mouth/Throat: Mucous membranes are moist.  Oropharynx non-erythematous. Neck: No stridor.   Cardiovascular: Normal rate, regular rhythm. Grossly normal heart sounds.  Good peripheral circulation. Respiratory: Normal respiratory effort.  No retractions. Lungs CTAB. Gastrointestinal: Soft and nontender. No distention. No abdominal bruits. No CVA tenderness. Musculoskeletal: No lower extremity tenderness nor edema.  Neurologic:  Normal speech and language. No gross focal neurologic deficits are appreciated. No gait instability. Skin:  Skin is warm, dry and intact. No rash  noted.   ____________________________________________   LABS (all labs ordered are listed, but only abnormal results are displayed)  Labs Reviewed  COMPREHENSIVE METABOLIC PANEL - Abnormal; Notable for the following components:      Result Value   Glucose, Bld 128 (*)    Total Bilirubin 1.3 (*)    All other components within normal limits  URINALYSIS, ROUTINE W REFLEX MICROSCOPIC - Abnormal; Notable for the following components:   Color, Urine YELLOW (*)    APPearance CLEAR (*)    Hgb urine dipstick MODERATE (*)    RBC / HPF >50 (*)    All other components within normal limits  CBC WITH DIFFERENTIAL/PLATELET  LIPASE, BLOOD   ____________________________________________  EKG EKG read interpreted by me shows atrially paced rhythm looks very similar to one from 2 years ago.  ____________________________________________  RADIOLOGY  ED MD interpretation:  Official radiology report(s): Dg Chest 2 View  Result Date: 02/17/2018 CLINICAL DATA:  Hypoxia. EXAM: CHEST - 2 VIEW COMPARISON:  August 18, 2016 FINDINGS: The 3 lead AICD device is stable. No pneumothorax. Cardiomegaly. The hila and mediastinum are unremarkable. Coarsened lung markings bilaterally. No focal infiltrate or other abnormality. IMPRESSION: Coarsened lung markings bilaterally may represent bronchitic change versus developing pulmonary venous congestion/mild edema. No other change. Electronically Signed   By: Dorise Bullion III M.D   On: 02/17/2018 15:18    ____________________________________________   PROCEDURES  Procedure(s) performed:   Procedures  Critical Care performed:   ____________________________________________   INITIAL IMPRESSION / ASSESSMENT AND PLAN / ED COURSE Patient's chest x-ray really does not show much.  We will get a CT scan of her chest along with the belly CT to see if there is a pneumonia or some other abnormality causing her hypoxia.  She has no wheezing.  She has no sign of  CHF.  ----------------------------------------- 3:56 PM on 02/17/2018 -----------------------------------------  Patient studies are pending and in process.  I will sign the patient out to Dr. Archie Balboa.         ____________________________________________   FINAL CLINICAL IMPRESSION(S) / ED DIAGNOSES  Final diagnoses:  Vaginal bleeding  Hypoxia  Abdominal pain, unspecified abdominal location     ED Discharge Orders    None       Note:  This document was prepared using Dragon voice recognition software and may include unintentional dictation errors.    Nena Polio, MD 02/17/18 1556

## 2018-02-17 NOTE — Progress Notes (Signed)
Patient taken to procedural area by tech.

## 2018-02-17 NOTE — ED Notes (Addendum)
Gi cocktail ordered from pharmacy - it is mixed there now and sent to [place] pt is here for abd pain on and off x 3 days and today vaginal bleeding. Pain is 5/10. Pt also has been reading low sp02 with no complaints of sob

## 2018-02-17 NOTE — ED Notes (Signed)
Pt finished contrast. Ct aware.

## 2018-02-17 NOTE — Anesthesia Procedure Notes (Signed)
Procedure Name: Intubation Date/Time: 02/17/2018 9:27 PM Performed by: Dionne Bucy, CRNA Pre-anesthesia Checklist: Patient identified, Patient being monitored, Timeout performed, Emergency Drugs available and Suction available Patient Re-evaluated:Patient Re-evaluated prior to induction Oxygen Delivery Method: Circle system utilized Preoxygenation: Pre-oxygenation with 100% oxygen Induction Type: IV induction Ventilation: Mask ventilation without difficulty Laryngoscope Size: Mac and 3 Grade View: Grade I Tube type: Oral Tube size: 7.0 mm Number of attempts: 1 Airway Equipment and Method: Stylet Placement Confirmation: ETT inserted through vocal cords under direct vision,  positive ETCO2 and breath sounds checked- equal and bilateral Secured at: 21 cm Tube secured with: Tape Dental Injury: Teeth and Oropharynx as per pre-operative assessment

## 2018-02-17 NOTE — Consult Note (Signed)
Obstetrics & Gynecology Consult H&P   Consulting Department: General Surgery  Consulting Physician: Benjamine Sprague, DO  Consulting Question: Postmenopausal bleeding and thickened endometrium   History of Present Illness: Patient is a 68 y.o. caucasian femle with significant cardiac past medical history who presents to ED today with 01-VCB of periumbilical abdominal pain.  The patient states she has had a single episode(s) of bleeding in the past 1 day(s).  The most recent episode occurred  1  day(s) ago.  The bleeding has been limited to spotting . She describes the blood as Bright red in appearance.  She has had bloating, abdominal pain and early satiety. She deniestrauma or other inciting event. She does not have a history of abnormal pap smears.   There are no other aggravating factors reported. There are no alleviating factors reported. No exogenous hormone exposures reported, no on anticoagulation or antiplatelet therapy (other than aspirin) She has nothad prior work up for postmenopausal bleeding.  Review of Systems:10 point review of systems  Past Medical History:  Past Medical History:  Diagnosis Date  . AICD (automatic cardioverter/defibrillator) present   . Anxiety   . CAD (coronary artery disease)   . Cardiomyopathy ([place])   . CHF (congestive heart failure) ([place])   . COPD (chronic obstructive pulmonary disease) ([place])   . Depression   . Dyspnea   . Headache   . History of shingles   . Hypertension   . Insomnia   . On home oxygen therapy    bedtime and prn  . Restless leg syndrome     Past Surgical History:  Past Surgical History:  Procedure Laterality Date  . CARDIAC CATHETERIZATION  04/2014   Dr. Idelle Leech  . CARDIAC CATHETERIZATION    . CARDIAC DEFIBRILLATOR PLACEMENT    . CHOLECYSTECTOMY N/A 08/23/2016   Procedure: LAPAROSCOPIC CHOLECYSTECTOMY;  Surgeon: Clayburn Pert, MD;  Location: [facility] ORS;  Service: General;  Laterality: N/A;  . HERNIA REPAIR       Family History:  Family History  Problem Relation Age of Onset  . Diabetes Mellitus II Mother   . Hypertension Mother   . Heart attack Father   . Heart disease Brother   . Arthritis Sister   . Depression Daughter   . Depression Son   . Schizophrenia Son   . Arthritis Sister   . Diabetes Brother     Social History:  Social History   Socioeconomic History  . Marital status: Divorced    Spouse name: Not on file  . Number of children: Not on file  . Years of education: Not on file  . Highest education level: Not on file  Occupational History  . Not on file  Social Needs  . Financial resource strain: Not hard at all  . Food insecurity:    Worry: Never true    Inability: Never true  . Transportation needs:    Medical: No    Non-medical: No  Tobacco Use  . Smoking status: Former Smoker    Packs/day: 0.25    Types: Cigarettes  . Smokeless tobacco: Never Used  . Tobacco comment: quit at age 8  Substance and Sexual Activity  . Alcohol use: No    Alcohol/week: 0.0 standard drinks  . Drug use: No  . Sexual activity: Not on file  Lifestyle  . Physical activity:    Days per week: 0 days    Minutes per session: 0 min  . Stress: Not at all  Relationships  . Social  connections:    Talks on phone: More than three times a week    Gets together: Twice a week    Attends religious service: More than 4 times per year    Active member of club or organization: No    Attends meetings of clubs or organizations: Never    Relationship status: Divorced  . Intimate partner violence:    Fear of current or ex partner: No    Emotionally abused: No    Physically abused: No    Forced sexual activity: No  Other Topics Concern  . Not on file  Social History Narrative  . Not on file    Allergies:  Allergies  Allergen Reactions  . Levaquin [Levofloxacin] Anaphylaxis  . Amoxicillin Hives  . Nitrofuran Derivatives Other (See Comments)    Reaction: unknown  . Penicillins Other  (See Comments)    Thinks it made her itch a lot, but isn't sure.  Marland Kitchen Zithromax [Azithromycin] Other (See Comments)  . Antihistamines, Chlorpheniramine-Type Rash    Medications: Prior to Admission medications   Medication Sig Start Date End Date Taking? Authorizing Provider  acetaminophen (TYLENOL) 500 MG tablet Take 500-1,000 mg by mouth every 8 (eight) hours as needed for mild pain or headache.     [provider]  aspirin EC 81 MG tablet Take 81 mg by mouth daily.    [provider]  atorvastatin (LIPITOR) 20 MG tablet Take 1 tablet (20 mg total) by mouth daily. 05/02/17   Kathrine Haddock, NP  carvedilol (COREG) 25 MG tablet Take 25 mg by mouth 2 (two) times daily. 04/12/17   [provider]  losartan (COZAAR) 50 MG tablet Take 50 mg by mouth daily. 04/10/17   [provider]  nystatin (MYCOSTATIN/NYSTOP) powder APPLY TOPICALLY 4 TIMES DAILY 11/05/17   Volney American, PA-C  nystatin cream (MYCOSTATIN) Apply 1 application topically 2 (two) times daily. 07/03/17   Volney American, PA-C  OXYGEN Inhale 2 L into the lungs daily.    [provider]  spironolactone (ALDACTONE) 25 MG tablet Take 12.5 mg by mouth daily. 04/09/17   [provider]    Physical Exam Vitals: Blood pressure 134/75, pulse 88, temperature (!) 97.5 F (36.4 C), temperature source Oral, resp. rate 18, height 5\' 4"  (1.626 m), weight 90.7 kg, SpO2 90 %. General: NAD HEENT: normocephalic, anicteric Pulmonary: No increased work of breathing Cardiovascular: RRR, distal pulses 2+ Genitourinary: deferred to OR Extremities: no edema, erythema, or tenderness Neurologic: Grossly intact Psychiatric: mood appropriate, affect full  Labs: Results for orders placed or performed during the hospital encounter of 02/17/18 (from the past 72 hour(s))  CBC with Differential     Status: None   Collection Time: 02/17/18 11:11 AM  Result Value Ref Range   WBC 7.2 4.0 - 10.5 K/uL    RBC 4.91 3.87 - 5.11 MIL/uL   Hemoglobin 13.8 12.0 - 15.0 g/dL   HCT 43.8 36.0 - 46.0 %   MCV 89.2 80.0 - 100.0 fL   MCH 28.1 26.0 - 34.0 pg   MCHC 31.5 30.0 - 36.0 g/dL   RDW 15.0 11.5 - 15.5 %   Platelets 228 150 - 400 K/uL   nRBC 0.0 0.0 - 0.2 %   Neutrophils Relative % 55 %   Neutro Abs 4.0 1.7 - 7.7 K/uL   Lymphocytes Relative 31 %   Lymphs Abs 2.2 0.7 - 4.0 K/uL   Monocytes Relative 8 %   Monocytes Absolute 0.6 0.1 -  1.0 K/uL   Eosinophils Relative 5 %   Eosinophils Absolute 0.3 0.0 - 0.5 K/uL   Basophils Relative 1 %   Basophils Absolute 0.1 0.0 - 0.1 K/uL   Immature Granulocytes 0 %   Abs Immature Granulocytes 0.02 0.00 - 0.07 K/uL    Comment: Performed at [facility], [address] [place], [place] [address]  Comprehensive metabolic panel     Status: Abnormal   Collection Time: 02/17/18 11:11 AM  Result Value Ref Range   Sodium 139 135 - 145 mmol/L   Potassium 4.9 3.5 - 5.1 mmol/L   Chloride 100 98 - 111 mmol/L   CO2 31 22 - 32 mmol/L   Glucose, Bld 128 (H) 70 - 99 mg/dL   BUN 11 8 - 23 mg/dL   Creatinine, Ser 0.81 0.44 - 1.00 mg/dL   Calcium 9.1 8.9 - 10.3 mg/dL   Total Protein 7.7 6.5 - 8.1 g/dL   Albumin 4.5 3.5 - 5.0 g/dL   AST 24 15 - 41 U/L   ALT 16 0 - 44 U/L   Alkaline Phosphatase 73 38 - 126 U/L   Total Bilirubin 1.3 (H) 0.3 - 1.2 mg/dL   GFR calc non Af Amer >60 >60 mL/min   GFR calc Af Amer >60 >60 mL/min   Anion gap 8 5 - 15    Comment: Performed at [facility], [address] [place], [place] [address]  Lipase, blood     Status: None   Collection Time: 02/17/18 11:11 AM  Result Value Ref Range   Lipase 29 11 - 51 U/L    Comment: Performed at [facility], [address] [place], [place] [address]  Urinalysis, Routine w reflex microscopic     Status: Abnormal   Collection Time: 02/17/18  3:05 PM  Result Value Ref Range   Color, Urine YELLOW (A) YELLOW   APPearance CLEAR (A) CLEAR   Specific Gravity, Urine  1.013 1.005 - 1.030   pH 7.0 5.0 - 8.0   Glucose, UA NEGATIVE NEGATIVE mg/dL   Hgb urine dipstick MODERATE (A) NEGATIVE   Bilirubin Urine NEGATIVE NEGATIVE   Ketones, ur NEGATIVE NEGATIVE mg/dL   Protein, ur NEGATIVE NEGATIVE mg/dL   Nitrite NEGATIVE NEGATIVE   Leukocytes,Ua NEGATIVE NEGATIVE   RBC / HPF >50 (H) 0 - 5 RBC/hpf   WBC, UA 0-5 0 - 5 WBC/hpf   Bacteria, UA NONE SEEN NONE SEEN   Squamous Epithelial / LPF 0-5 0 - 5    Comment: Performed at [facility], [address], [place], [place] [address]    Imaging Dg Chest 2 View  Result Date: 02/17/2018 CLINICAL DATA:  Hypoxia. EXAM: CHEST - 2 VIEW COMPARISON:  August 18, 2016 FINDINGS: The 3 lead AICD device is stable. No pneumothorax. Cardiomegaly. The hila and mediastinum are unremarkable. Coarsened lung markings bilaterally. No focal infiltrate or other abnormality. IMPRESSION: Coarsened lung markings bilaterally may represent bronchitic change versus developing pulmonary venous congestion/mild edema. No other change. Electronically Signed   By: Dorise Bullion III M.D   On: 02/17/2018 15:18   Ct Chest W Contrast  Result Date: 02/17/2018 CLINICAL DATA:  Dyspnea. Epigastric and right upper abdominal pain. Vaginal bleeding. Prior cholecystectomy. EXAM: CT CHEST, ABDOMEN, AND PELVIS WITH CONTRAST TECHNIQUE: Multidetector CT imaging of the chest, abdomen and pelvis was performed following the standard protocol during bolus administration of intravenous contrast. CONTRAST:  173mL OMNIPAQUE IOHEXOL 300 MG/ML  SOLN COMPARISON:  12/09/2010 chest  CT. 09/21/2012 CT abdomen/pelvis. 08/01/2016 right upper quadrant abdominal sonogram. Chest radiograph from earlier today. FINDINGS: CT CHEST FINDINGS Significantly motion degraded scan, limiting assessment. Cardiovascular: Moderate cardiomegaly. No significant pericardial effusion/thickening. Three-vessel coronary atherosclerosis. Three lead left subclavian ICD is noted with lead tips in the  right atrium, right ventricular apex and coronary sinus. Atherosclerotic nonaneurysmal thoracic aorta. Dilated main pulmonary artery (3.6 cm diameter). No central pulmonary emboli. Mediastinum/Nodes: No discrete thyroid nodules. Unremarkable esophagus. No pathologically enlarged axillary, mediastinal or hilar lymph nodes. Lungs/Pleura: No pneumothorax. No pleural effusion. Mildly thickened parenchymal bands throughout the left upper lobe and less prominently in the left lower lobe and anterior right upper lobe, compatible with scarring or atelectasis. Subsolid nodular 2.3 cm posterior right upper lobe opacity (series 4/image 45), which appears somewhat bandlike on the coronal reconstruction, which may indicate evolving postinfectious scarring. Mild patchy ground-glass attenuation throughout both lungs with mild mosaic attenuation. Musculoskeletal: No aggressive appearing focal osseous lesions. Moderate thoracic spondylosis. CT ABDOMEN PELVIS FINDINGS Hepatobiliary: Normal liver with no liver mass. Cholecystectomy. No biliary ductal dilatation. Pancreas: Normal, with no mass or duct dilation. Spleen: Normal size. No mass. Adrenals/Urinary Tract: Normal adrenals. Scattered subcentimeter hypodense renal cortical lesions in both kidneys are too small to characterize and require no follow-up. No hydronephrosis. Normal bladder. Stomach/Bowel: Normal non-distended stomach. Normal caliber small bowel with no small bowel wall thickening. The appendix is located in the anterior right upper quadrant and is dilated (11 mm diameter on series 2/image 68) with periappendiceal fat stranding. Appendix: Location: Anterior right upper quadrant Diameter: 11 mm Appendicolith: Not press Mucosal hyper-enhancement: Not present Extraluminal gas: Not present Periappendiceal collection: Not present Normal large bowel with no diverticulosis, large bowel wall thickening or pericolonic fat stranding. Vascular/Lymphatic: Atherosclerotic  nonaneurysmal abdominal aorta. Patent portal, splenic, hepatic and renal veins. No pathologically enlarged lymph nodes in the abdomen or pelvis. Reproductive: Suggestion of endometrial thickening (approximately 17 mm). Anteverted uterus. Simple 2.4 cm right adnexal cyst. No left adnexal mass. Other: No pneumoperitoneum, ascites or focal fluid collection. Musculoskeletal: No aggressive appearing focal osseous lesions. Mild lumbar spondylosis. IMPRESSION: 1. CT findings are compatible with acute uncomplicated appendicitis, noting an unusual location of the appendix in the anterior right upper quadrant near the gallbladder fossa. 2. Suggestion of abnormal endometrial thickening (17 mm). Endometrial malignancy can not be excluded. Simple 2.4 cm right adnexal cyst. Recommend correlation with transabdominal/transvaginal pelvic ultrasound. 3. Moderate cardiomegaly. 4. Mild patchy ground-glass attenuation throughout both lungs with mild mosaic attenuation, suggesting a combination of mild pulmonary edema and mosaic perfusion due to pulmonary vascular disease (dilated main pulmonary artery suggests pulmonary arterial hypertension). 5. Three-vessel coronary atherosclerosis. 6. Nodular subsolid 2.3 cm posterior right upper lobe opacity, which appears bandlike on coronal reconstructions, possibly evolving postinfectious/postinflammatory scarring. Recommend attention on follow-up chest CT in 3 months. 7.  Aortic Atherosclerosis (ICD10-I70.0). Electronically Signed   By: Ilona Sorrel M.D.   On: 02/17/2018 16:52   Ct Abdomen Pelvis W Contrast  Result Date: 02/17/2018 CLINICAL DATA:  Dyspnea. Epigastric and right upper abdominal pain. Vaginal bleeding. Prior cholecystectomy. EXAM: CT CHEST, ABDOMEN, AND PELVIS WITH CONTRAST TECHNIQUE: Multidetector CT imaging of the chest, abdomen and pelvis was performed following the standard protocol during bolus administration of intravenous contrast. CONTRAST:  161mL OMNIPAQUE IOHEXOL 300  MG/ML  SOLN COMPARISON:  12/09/2010 chest CT. 09/21/2012 CT abdomen/pelvis. 08/01/2016 right upper quadrant abdominal sonogram. Chest radiograph from earlier today. FINDINGS: CT CHEST FINDINGS Significantly motion degraded scan, limiting assessment. Cardiovascular: Moderate cardiomegaly. No  significant pericardial effusion/thickening. Three-vessel coronary atherosclerosis. Three lead left subclavian ICD is noted with lead tips in the right atrium, right ventricular apex and coronary sinus. Atherosclerotic nonaneurysmal thoracic aorta. Dilated main pulmonary artery (3.6 cm diameter). No central pulmonary emboli. Mediastinum/Nodes: No discrete thyroid nodules. Unremarkable esophagus. No pathologically enlarged axillary, mediastinal or hilar lymph nodes. Lungs/Pleura: No pneumothorax. No pleural effusion. Mildly thickened parenchymal bands throughout the left upper lobe and less prominently in the left lower lobe and anterior right upper lobe, compatible with scarring or atelectasis. Subsolid nodular 2.3 cm posterior right upper lobe opacity (series 4/image 45), which appears somewhat bandlike on the coronal reconstruction, which may indicate evolving postinfectious scarring. Mild patchy ground-glass attenuation throughout both lungs with mild mosaic attenuation. Musculoskeletal: No aggressive appearing focal osseous lesions. Moderate thoracic spondylosis. CT ABDOMEN PELVIS FINDINGS Hepatobiliary: Normal liver with no liver mass. Cholecystectomy. No biliary ductal dilatation. Pancreas: Normal, with no mass or duct dilation. Spleen: Normal size. No mass. Adrenals/Urinary Tract: Normal adrenals. Scattered subcentimeter hypodense renal cortical lesions in both kidneys are too small to characterize and require no follow-up. No hydronephrosis. Normal bladder. Stomach/Bowel: Normal non-distended stomach. Normal caliber small bowel with no small bowel wall thickening. The appendix is located in the anterior right upper  quadrant and is dilated (11 mm diameter on series 2/image 68) with periappendiceal fat stranding. Appendix: Location: Anterior right upper quadrant Diameter: 11 mm Appendicolith: Not press Mucosal hyper-enhancement: Not present Extraluminal gas: Not present Periappendiceal collection: Not present Normal large bowel with no diverticulosis, large bowel wall thickening or pericolonic fat stranding. Vascular/Lymphatic: Atherosclerotic nonaneurysmal abdominal aorta. Patent portal, splenic, hepatic and renal veins. No pathologically enlarged lymph nodes in the abdomen or pelvis. Reproductive: Suggestion of endometrial thickening (approximately 17 mm). Anteverted uterus. Simple 2.4 cm right adnexal cyst. No left adnexal mass. Other: No pneumoperitoneum, ascites or focal fluid collection. Musculoskeletal: No aggressive appearing focal osseous lesions. Mild lumbar spondylosis. IMPRESSION: 1. CT findings are compatible with acute uncomplicated appendicitis, noting an unusual location of the appendix in the anterior right upper quadrant near the gallbladder fossa. 2. Suggestion of abnormal endometrial thickening (17 mm). Endometrial malignancy can not be excluded. Simple 2.4 cm right adnexal cyst. Recommend correlation with transabdominal/transvaginal pelvic ultrasound. 3. Moderate cardiomegaly. 4. Mild patchy ground-glass attenuation throughout both lungs with mild mosaic attenuation, suggesting a combination of mild pulmonary edema and mosaic perfusion due to pulmonary vascular disease (dilated main pulmonary artery suggests pulmonary arterial hypertension). 5. Three-vessel coronary atherosclerosis. 6. Nodular subsolid 2.3 cm posterior right upper lobe opacity, which appears bandlike on coronal reconstructions, possibly evolving postinfectious/postinflammatory scarring. Recommend attention on follow-up chest CT in 3 months. 7.  Aortic Atherosclerosis (ICD10-I70.0). Electronically Signed   By: Ilona Sorrel M.D.   On:  02/17/2018 16:52    Assessment: 68 y.o. with postmenopausal bleeding and thickened endometrial lining on imaging  Plan:  1) Postmenopausal bleeding - We discussed that menopause is a clinical diagnosis made after 12 months of amenorrhea.  The average age of menopause in the  [place] population is 60 but there may be significant variation.  Any bleeding that happens after a 12 month period of amenorrhea warrants further work.  Possible etiologies of postmenopausal bleeding were discussed with the patient today.  These may range from benign etiologies such as urethral prolapse and atrophy, to indeterminate lesions such as submucosal fibroids or polyps which would require resection to accurately evaluate. The role of unopposed estrogen in the development of endometrial hyperplasia or carcinoma is discussed.  The risk of endometrial hyperplasia is linearly correlated with increasing BMI given the production of estrone by adipose tissue.  Work up will be include transvaginal ultrasound to assess the thickness of the endometrial lining as well as to assess for focal uterine lesions.  Negative ultrasound evaluation, defined as the absence of focal lesions and endometrial stripe of <40mm, effectively rules out carcinoma and confirms atrophy as the most likely etiology.  Should focal lesions be present these generally require hysteroscopic resection.  Should lining be greater >28mm endometrial biopsy is warranted to rule out hyperplasia or frank endometrial cancer.  Given that patient has thickened endometrial lining on CT and is going back for surgery will plan on concurrent endometrial biopsy to get work up started.  Continued episodes of bleeding despite negative ultrasound also warrant endometrial sampling.  As the cervical pathology may also be implicated in postmenopausal bleeding prior cervical cytology was reviewed and repeated if required per ASCCP guidelines.   - no preoperative antibiotics required for  endometrial biopsy - Lithotomy position using yellow fin stirups.  Does not have to be done sterile just betadine cervical prep so can be done pre or post appendectomy  2) Simple asymptomatic right ovarian cyst - recommend follow imaging outpatient.      A)  Cysts ?1 cm: Are clinically inconsequential; at the discretion of the interpreting physician whether or not to describe them in the imaging report; do not need follow-up.      B) Cysts >1 and ?7 cm: Should be described in the imaging report with statement that they are almost certainly benign; yearly follow-up, at least initially, with [place] recommended. Some practices may opt to increase the lower size threshold for follow-up from 1 cm to as high as 3 cm. One may opt to continue follow-up annually or to decrease the frequency of follow-up once stability or decrease in size has been confirmed. Cysts in the larger end of this range should still generally be followed on a regular basis.     C) Cysts >7 cm: Since these may be difficult to assess completely with [place], further imaging with MR or surgical evaluation should be considered.  Gordy Levan et al. Management of Asymptomatic Ovarian and Other Adnexal Cysts Imaged at [place]: Society of Radiologists in [place] Statement 2010. Radiology 256 (Sept 2010): [phone].).    Malachy Mood, MD, [facility], [facility] 02/17/2018, 6:39 PM

## 2018-02-17 NOTE — H&P (Signed)
Subjective:   CC: acute appendicitis  HPI:  Maria Neal is a 68 y.o. female who is consulted by Archie Balboa for evaluation of  above cc.  Symptoms were first noted 2 days ago. Pain is sharp, located in RUQ.   Associated with nausea, no emesis, exacerbated by nothing specific.   Patient incidentally also started having some spontaneous vaginal bleeding starting yesterday.  Last menstrual cycle was years ago.  Denies any recent trauma to the area.   Past Medical History:  has a past medical history of AICD (automatic cardioverter/defibrillator) present, Anxiety, CAD (coronary artery disease), Cardiomyopathy ([place]), CHF (congestive heart failure) ([place]), COPD (chronic obstructive pulmonary disease) ([place]), Depression, Dyspnea, Headache, History of shingles, Hypertension, Insomnia, On home oxygen therapy, and Restless leg syndrome.  Past Surgical History:  has a past surgical history that includes Cardiac catheterization (04/2014); Cardiac catheterization; Cardiac defibrillator placement; Hernia repair; and Cholecystectomy (N/A, 08/23/2016).  Family History: family history includes Arthritis in her sister and sister; Depression in her daughter and son; Diabetes in her brother; Diabetes Mellitus II in her mother; Heart attack in her father; Heart disease in her brother; Hypertension in her mother; Schizophrenia in her son.  Social History:  reports that she has quit smoking. Her smoking use included cigarettes. She smoked 0.25 packs per day. She has never used smokeless tobacco. She reports that she does not drink alcohol or use drugs.  Current Medications: includes carvedilol and aspirin  Allergies:  Allergies as of 02/17/2018 - Review Complete 02/17/2018  Allergen Reaction Noted  . Levaquin [levofloxacin] Anaphylaxis 05/03/2014  . Amoxicillin Hives 05/03/2014  . Nitrofuran derivatives Other (See Comments) 05/03/2014  . Penicillins Other (See Comments) 05/03/2014  . Zithromax [azithromycin] Other  (See Comments) 05/03/2014  . Antihistamines, chlorpheniramine-type Rash 04/22/2010    ROS:  General: Denies weight loss, weight gain, fatigue, fevers, chills, and night sweats. Eyes: Denies blurry vision, double vision, eye pain, itchy eyes, and tearing. Ears: Denies hearing loss, earache, and ringing in ears. Nose: Denies sinus pain, congestion, infections, runny nose, and nosebleeds. Mouth/throat: Denies hoarseness, sore throat, bleeding gums, and difficulty swallowing. Heart: Denies chest pain, palpitations, racing heart, irregular heartbeat, leg pain or swelling, and decreased activity tolerance. Respiratory: Denies breathing difficulty, shortness of breath, wheezing, cough, and sputum. GI: Denies change in appetite, heartburn, vomiting, constipation, diarrhea, and blood in stool. GU: Denies difficulty urinating, pain with urinating, urgency, frequency, blood in urine.  Positive vaginal bleeding Musculoskeletal: Denies joint stiffness, pain, swelling, muscle weakness. Skin: Denies rash, itching, mass, tumors, sores, and boils Neurologic: Denies headache, fainting, dizziness, seizures, numbness, and tingling. Psychiatric: Denies depression, anxiety, difficulty sleeping, and memory loss. Endocrine: Denies heat or cold intolerance, and increased thirst or urination. Blood/lymph: Denies easy bruising, easy bruising, and swollen glands    Objective:     BP 134/75   Pulse 88   Temp (!) 97.5 F (36.4 C) (Oral)   Resp 18   Ht 5\' 4"  (1.626 m)   Wt 90.7 kg   SpO2 90%   BMI 34.33 kg/m    Constitutional :  alert, cooperative, appears stated age and no distress  Lymphatics/Throat:  no asymmetry, masses, or scars  Respiratory:  clear to auscultation bilaterally  Cardiovascular:  regular rate and rhythm  Gastrointestinal: Soft, no guarding, focal guarding noted in the epigastric region towards the right upper quadrant consistent with the area of the appendix noted on CT scan..    Musculoskeletal: Steady gait and movement  Skin: Cool and moist.  Midline scar  Psychiatric: Normal affect, non-agitated, not confused       LABS:  CMP Latest Ref Rng & Units 02/17/2018 04/16/2017 08/01/2016  Glucose 70 - 99 mg/dL 128(H) 97 94  BUN 8 - 23 mg/dL 11 16 29(H)  Creatinine 0.44 - 1.00 mg/dL 0.81 0.95 1.23(H)  Sodium 135 - 145 mmol/L 139 141 142  Potassium 3.5 - 5.1 mmol/L 4.9 4.6 4.4  Chloride 98 - 111 mmol/L 100 98 103  CO2 22 - 32 mmol/L 31 26 29   Calcium 8.9 - 10.3 mg/dL 9.1 9.8 9.9  Total Protein 6.5 - 8.1 g/dL 7.7 7.3 8.2(H)  Total Bilirubin 0.3 - 1.2 mg/dL 1.3(H) 0.4 0.8  Alkaline Phos 38 - 126 U/L 73 80 71  AST 15 - 41 U/L 24 17 23   ALT 0 - 44 U/L 16 11 14    CBC Latest Ref Rng & Units 02/17/2018 08/01/2016 09/02/2014  WBC 4.0 - 10.5 K/uL 7.2 6.7 7.0  Hemoglobin 12.0 - 15.0 g/dL 13.8 13.5 13.0  Hematocrit 36.0 - 46.0 % 43.8 40.3 38.9  Platelets 150 - 400 K/uL 228 191 194     RADS: CLINICAL DATA:  Dyspnea. Epigastric and right upper abdominal pain. Vaginal bleeding. Prior cholecystectomy.  EXAM: CT CHEST, ABDOMEN, AND PELVIS WITH CONTRAST  TECHNIQUE: Multidetector CT imaging of the chest, abdomen and pelvis was performed following the standard protocol during bolus administration of intravenous contrast.  CONTRAST:  125mL OMNIPAQUE IOHEXOL 300 MG/ML  SOLN  COMPARISON:  12/09/2010 chest CT. 09/21/2012 CT abdomen/pelvis. 08/01/2016 right upper quadrant abdominal sonogram. Chest radiograph from earlier today.  FINDINGS: CT CHEST FINDINGS  Significantly motion degraded scan, limiting assessment.  Cardiovascular: Moderate cardiomegaly. No significant pericardial effusion/thickening. Three-vessel coronary atherosclerosis. Three lead left subclavian ICD is noted with lead tips in the right atrium, right ventricular apex and coronary sinus. Atherosclerotic nonaneurysmal thoracic aorta. Dilated main pulmonary artery (3.6 cm diameter). No central  pulmonary emboli.  Mediastinum/Nodes: No discrete thyroid nodules. Unremarkable esophagus. No pathologically enlarged axillary, mediastinal or hilar lymph nodes.  Lungs/Pleura: No pneumothorax. No pleural effusion. Mildly thickened parenchymal bands throughout the left upper lobe and less prominently in the left lower lobe and anterior right upper lobe, compatible with scarring or atelectasis. Subsolid nodular 2.3 cm posterior right upper lobe opacity (series 4/image 45), which appears somewhat bandlike on the coronal reconstruction, which may indicate evolving postinfectious scarring. Mild patchy ground-glass attenuation throughout both lungs with mild mosaic attenuation.  Musculoskeletal: No aggressive appearing focal osseous lesions. Moderate thoracic spondylosis.  CT ABDOMEN PELVIS FINDINGS  Hepatobiliary: Normal liver with no liver mass. Cholecystectomy. No biliary ductal dilatation.  Pancreas: Normal, with no mass or duct dilation.  Spleen: Normal size. No mass.  Adrenals/Urinary Tract: Normal adrenals. Scattered subcentimeter hypodense renal cortical lesions in both kidneys are too small to characterize and require no follow-up. No hydronephrosis. Normal bladder.  Stomach/Bowel: Normal non-distended stomach. Normal caliber small bowel with no small bowel wall thickening. The appendix is located in the anterior right upper quadrant and is dilated (11 mm diameter on series 2/image 68) with periappendiceal fat stranding.  Appendix: Location: Anterior right upper quadrant  Diameter: 11 mm  Appendicolith: Not press  Mucosal hyper-enhancement: Not present  Extraluminal gas: Not present  Periappendiceal collection: Not present  Normal large bowel with no diverticulosis, large bowel wall thickening or pericolonic fat stranding.  Vascular/Lymphatic: Atherosclerotic nonaneurysmal abdominal aorta. Patent portal, splenic, hepatic and renal veins. No  pathologically enlarged lymph nodes in the abdomen or pelvis.  Reproductive: Suggestion of endometrial thickening (approximately 17 mm). Anteverted uterus. Simple 2.4 cm right adnexal cyst. No left adnexal mass.  Other: No pneumoperitoneum, ascites or focal fluid collection.  Musculoskeletal: No aggressive appearing focal osseous lesions. Mild lumbar spondylosis.  IMPRESSION: 1. CT findings are compatible with acute uncomplicated appendicitis, noting an unusual location of the appendix in the anterior right upper quadrant near the gallbladder fossa. 2. Suggestion of abnormal endometrial thickening (17 mm). Endometrial malignancy can not be excluded. Simple 2.4 cm right adnexal cyst. Recommend correlation with transabdominal/transvaginal pelvic ultrasound. 3. Moderate cardiomegaly. 4. Mild patchy ground-glass attenuation throughout both lungs with mild mosaic attenuation, suggesting a combination of mild pulmonary edema and mosaic perfusion due to pulmonary vascular disease (dilated main pulmonary artery suggests pulmonary arterial hypertension). 5. Three-vessel coronary atherosclerosis. 6. Nodular subsolid 2.3 cm posterior right upper lobe opacity, which appears bandlike on coronal reconstructions, possibly evolving postinfectious/postinflammatory scarring. Recommend attention on follow-up chest CT in 3 months. 7.  Aortic Atherosclerosis (ICD10-I70.0).   Electronically Signed   By: Ilona Sorrel M.D.   On: 02/17/2018 16:52  Assessment:      Acute appendicitis Vaginal bleeding concerning possible endometrial neoplasm  Plan:      Discussed the risk of surgery including post-op infxn, seroma, hematoma, abscess formation, chronic pain, poor-delayed wound healing, possible bowel resection, possible ostomy, possible conversion to open procedure, post-op SBO or ileus, and need for additional procedures to address said risks.  The risks of general anesthetic including  MI, CVA, sudden death or even reaction to anesthetic medications also discussed. Alternatives include continued observation, or antibiotic treatment.  Benefits include possible symptom relief,   Typical post operative recovery of 3-5 days rest, also discussed.  The patient understands the risks, any and all questions were answered to the patient's satisfaction.  Discussed with OBGYN oncall and will arrange biopsy at time of lap appy to start workup for endometrial wall thickening.    She is at higher risk of perioperaitve complications due to comorbidities, along with previous surgeries in the past.  We discussed abx therapy but patient did not want to worry about recurrence so will like to proceed with surgery, accepting increased risk

## 2018-02-17 NOTE — Op Note (Signed)
   ENDOMETRIAL BIOPSY      The indications for endometrial biopsy were reviewed.   Risks of the biopsy including cramping, bleeding, infection, uterine perforation, inadequate specimen and need for additional procedures  were discussed. The patient states she understands and agrees to undergo procedure today. Consent was signed. Time out was performed.   A Graves speculum was placed and the cervix was brought into view.  The cervix was prepped with Betadine. A single-toothed tenaculum was placed on the anterior lip of the cervix for traction. A 3 mm pipelle was introduced through the cervix into the endometrial cavity without difficulty to a depth of 9cm, and a  Large amount of necrotic appearing tissue was obtained with 5 passes, the resulting specime sent to pathology. The instruments were removed from the patient's vagina. Minimal bleeding from the cervix was noted. The patient tolerated the procedure well.   She will be contacted by phone one results become available.     Malachy Mood, MD, [facility], [facility]

## 2018-02-17 NOTE — ED Triage Notes (Signed)
Pt presents to ED via POV c/o vaginal bleeding and abd pain x3 days.

## 2018-02-18 ENCOUNTER — Encounter (INDEPENDENT_AMBULATORY_CARE_PROVIDER_SITE_OTHER): Payer: Medicare Other | Admitting: Vascular Surgery

## 2018-02-18 ENCOUNTER — Encounter: Payer: Self-pay | Admitting: Obstetrics and Gynecology

## 2018-02-18 LAB — CBC
HCT: 40.7 % (ref 36.0–46.0)
HEMOGLOBIN: 12.9 g/dL (ref 12.0–15.0)
MCH: 28.5 pg (ref 26.0–34.0)
MCHC: 31.7 g/dL (ref 30.0–36.0)
MCV: 89.8 fL (ref 80.0–100.0)
Platelets: 172 10*3/uL (ref 150–400)
RBC: 4.53 MIL/uL (ref 3.87–5.11)
RDW: 14.9 % (ref 11.5–15.5)
WBC: 7 10*3/uL (ref 4.0–10.5)
nRBC: 0 % (ref 0.0–0.2)

## 2018-02-18 LAB — MAGNESIUM: Magnesium: 1.9 mg/dL (ref 1.7–2.4)

## 2018-02-18 LAB — BASIC METABOLIC PANEL
Anion gap: 9 (ref 5–15)
BUN: 12 mg/dL (ref 8–23)
CO2: 28 mmol/L (ref 22–32)
Calcium: 8.9 mg/dL (ref 8.9–10.3)
Chloride: 101 mmol/L (ref 98–111)
Creatinine, Ser: 0.94 mg/dL (ref 0.44–1.00)
GFR calc Af Amer: >60 mL/min (ref >60)
GFR calc non Af Amer: >60 mL/min (ref >60)
GLUCOSE: 146 mg/dL — AB (ref 70–99)
Potassium: 4.4 mmol/L (ref 3.5–5.1)
Sodium: 138 mmol/L (ref 135–145)

## 2018-02-18 LAB — PHOSPHORUS: Phosphorus: 4.5 mg/dL (ref 2.5–4.6)

## 2018-02-18 MED ORDER — IBUPROFEN 800 MG PO TABS: 800.0000 mg | ORAL_TABLET | Freq: Three times a day (TID) | ORAL | 0 refills | Status: DC | PRN

## 2018-02-18 MED ORDER — CARVEDILOL 25 MG PO TABS
25.0000 mg | ORAL_TABLET | Freq: Two times a day (BID) | ORAL | Status: DC
  Administered 2018-02-18 (×2): 25 mg via ORAL
  Filled 2018-02-18 (×2): qty 1

## 2018-02-18 MED ORDER — SPIRONOLACTONE 25 MG PO TABS
12.5000 mg | ORAL_TABLET | Freq: Every day | ORAL | Status: DC
  Administered 2018-02-18: 12.5 mg via ORAL
  Filled 2018-02-18: qty 0.5
  Filled 2018-02-18: qty 1

## 2018-02-18 MED ORDER — IPRATROPIUM-ALBUTEROL 0.5-2.5 (3) MG/3ML IN SOLN
3.0000 mL | Freq: Once | RESPIRATORY_TRACT | Status: AC
  Administered 2018-02-18: 3 mL via RESPIRATORY_TRACT

## 2018-02-18 MED ORDER — HYDROCODONE-ACETAMINOPHEN 5-325 MG PO TABS: 1.0000 | ORAL_TABLET | Freq: Four times a day (QID) | ORAL | 0 refills | Status: AC | PRN

## 2018-02-18 MED ORDER — IPRATROPIUM-ALBUTEROL 0.5-2.5 (3) MG/3ML IN SOLN
RESPIRATORY_TRACT | Status: AC
  Administered 2018-02-18: 3 mL via RESPIRATORY_TRACT
  Filled 2018-02-18: qty 3

## 2018-02-18 MED ORDER — NYSTATIN 100000 UNIT/GM EX POWD
Freq: Three times a day (TID) | CUTANEOUS | Status: DC
  Administered 2018-02-18: 09:00:00 via TOPICAL
  Filled 2018-02-18: qty 15

## 2018-02-18 MED ORDER — ENOXAPARIN SODIUM 40 MG/0.4ML ~~LOC~~ SOLN
40.0000 mg | SUBCUTANEOUS | Status: DC
  Filled 2018-02-18: qty 0.4

## 2018-02-18 MED ORDER — ACETAMINOPHEN 325 MG PO TABS: 650.0000 mg | ORAL_TABLET | Freq: Three times a day (TID) | ORAL | 0 refills | Status: AC | PRN

## 2018-02-18 MED ORDER — DOCUSATE SODIUM 100 MG PO CAPS: 100.0000 mg | ORAL_CAPSULE | Freq: Two times a day (BID) | ORAL | 0 refills | Status: AC | PRN

## 2018-02-18 MED ORDER — NYSTATIN 100000 UNIT/GM EX CREA
1.0000 "application " | TOPICAL_CREAM | Freq: Two times a day (BID) | CUTANEOUS | Status: DC
  Administered 2018-02-18: 1 via TOPICAL
  Filled 2018-02-18: qty 15

## 2018-02-18 MED ORDER — LOSARTAN POTASSIUM 50 MG PO TABS
50.0000 mg | ORAL_TABLET | Freq: Every day | ORAL | Status: DC
  Administered 2018-02-18: 50 mg via ORAL
  Filled 2018-02-18: qty 1

## 2018-02-18 NOTE — Op Note (Signed)
Preoperative diagnosis: Acute appendicitis.  Postoperative diagnosis: Acute appendicitis  Procedure: Laparoscopic appendectomy.  Anesthesia: GETA  Surgeon: Benjamine Sprague  Wound Classification: clean contaminated  Specimen: Appendix  Complications: None  Estimated Blood Loss: 3 mL   Indications: Patient is a 68 y.o. female  presented with right upper quadrant pain.  Computed tomography scan and physical examination were consistent with acute appendicitis. Incidental note of endometrial thickening consistent with bloody vaginal discharge complaint.  OBGYN performed endometrial biopsy prior to case.  Findings: 1. Acutely inflamed appendix 2. No peri-appendiceal abscess or phlegmon 3. Appendiceal artery ligated and divided with EndoGIA 4. Adequate hemostasis.   Description of procedure: The patient was placed on the operating table in the supine position, left arm tucked. General anesthesia was induced. A time-out was completed verifying correct patient, procedure, site, positioning, and implant(s) and/or special equipment prior to beginning this procedure. A Foley catheter placed. The abdomen was prepped and draped in the usual sterile fashion.   After local was infused, Veress insufflation attempted at [place].  Couple attempts wre unsuccessful despite two clicks and positive saline drop test.  Optiview was directly attempted and successfully entered peritoneal cavity Abdomen insufflated with carbon dioxide to a pressure of 15 mmHg. The patient tolerated insufflation well.  The laparoscope was reinserted and the abdomen inspected. No injuries from initial trocar placement or veress needle attempt were noted. Dense adhesion from previous ventral hernia repair noted around easily visible appendix in RUQ.  Two 43mm port and one 12-mm port was then placed along right abdominal wall to triangulate the area where appendix was noted under direct visualization.  Care was taken to avoid injury  to the colon and liver. TAn inflamed appendix was identified and elevated.  Window created at base of appendix in the mesentery.   An endoscopic blue load linear cutting stapler was then used to divide and staple the base of the appendix. It was reloaded with a vascular cartridge and the mesoappendix similarly divided.  The appendix was placed in an endoscopic retrieval bag and removed.   The appendiceal stump was examined and hemostasis achieved with clips. Scant serosanguinous fluid and minimal blood suctioned out.  No other pathology was identified. The 59mml trocar removed and port site closed with PMI using 0 vicryl under direct vision. The abdomen was allowed to collapse. Deep dermal then closed with 3-0 vicryl interrupted fashion.  All skin incisions then closed with subcuticular sutures Monocryl 4-0.  Wounds then dressed with dermabond.  The patient tolerated the procedure well, foley removed, awakened from anesthesia and was taken to the postanesthesia care unit in satisfactory condition.  Sponge count and instrument count correct at the end of the procedure.

## 2018-02-18 NOTE — Care Management Obs Status (Signed)
[place] NOTIFICATION   Patient Details  Name: Maria Neal MRN: [id number] Date of Birth: 05-28-1950   Medicare Observation Status Notification Given:  No(admitted obs less than 24 hours)    Beverly Sessions, RN 02/18/2018, 11:05 AM

## 2018-02-18 NOTE — Progress Notes (Signed)
Pt to be transferred to telemetry   Dr Lysle Pearl aware  Pt has been seen by dr Haywood Pao has seen pt in past of unsustained v tach    Pt had short run of v tach but not sustained  Pt stable  Vital signs stable

## 2018-02-18 NOTE — Progress Notes (Signed)
Dr Kayleen Memos  In to see pt    Duo neb given for low sats in upper 90's   hospitalist to see pt

## 2018-02-18 NOTE — Progress Notes (Signed)
Dr Lysle Pearl good to have pt stay on medical surgery floor and have off unit telemetry

## 2018-02-18 NOTE — Progress Notes (Addendum)
Pt states she does not have her home O2 with her. Pt educated on need to have O2 for her drive home. Pt states she does not need it and will be fine. Pt states she  Only lives 10 min away. Pt family member at bedside states that she will take straight home. Pt family member asked to return home to get her O2.  Pt and family verbalize understanding. Family member will go get pt's O2.

## 2018-02-18 NOTE — Discharge Summary (Signed)
Physician Discharge Summary  Patient ID: Maria Neal MRN: [id number] DOB/AGE: 1950/08/27 68 y.o.  Admit date: 02/17/2018 Discharge date: 02/18/2018  Admission Diagnoses: acute appendicitis and vaginal bleeding  Discharge Diagnoses:  Same as above  Discharged Condition: good  Hospital Course: admitted for above. Underwent lap appy and endometrial biopsy.  Recovered without any issue  Consults: gynecology  Discharge Exam: Blood pressure 123/61, pulse 67, temperature 97.6 F (36.4 C), temperature source Oral, resp. rate 20, height 5\' 4"  (1.626 m), weight 90.7 kg, SpO2 93 %. Gen: NAD,  ABd: soft, appropriately tender around incision sites that are c/d/i  Disposition:  Discharge disposition: 01-Home or Self Care       Discharge Instructions    Discharge patient   Complete by:  As directed    Discharge disposition:  01-Home or Self Care   Discharge patient date:  02/18/2018     Allergies as of 02/18/2018      Reactions   Levaquin [levofloxacin] Anaphylaxis   Amoxicillin Hives   Nitrofuran Derivatives Other (See Comments)   Reaction: unknown   Penicillins Other (See Comments)   Thinks it made her itch a lot, but isn't sure.   Zithromax [azithromycin] Other (See Comments)   Antihistamines, Chlorpheniramine-type Rash      Medication List    TAKE these medications   acetaminophen 500 MG tablet Commonly known as:  TYLENOL Take 500-1,000 mg by mouth every 8 (eight) hours as needed for mild pain or headache. What changed:  Another medication with the same name was added. Make sure you understand how and when to take each.   acetaminophen 325 MG tablet Commonly known as:  TYLENOL Take 2 tablets (650 mg total) by mouth every 8 (eight) hours as needed for up to 30 days for mild pain. What changed:  You were already taking a medication with the same name, and this prescription was added. Make sure you understand how and when to take each.   aspirin EC 81 MG tablet Take 81  mg by mouth daily. Notes to patient:  Restart on wed, 02/20/2018   atorvastatin 20 MG tablet Commonly known as:  LIPITOR Take 1 tablet (20 mg total) by mouth daily.   carvedilol 25 MG tablet Commonly known as:  COREG Take 25 mg by mouth 2 (two) times daily.   docusate sodium 100 MG capsule Commonly known as:  COLACE Take 1 capsule (100 mg total) by mouth 2 (two) times daily as needed for up to 10 days for mild constipation.   HYDROcodone-acetaminophen 5-325 MG tablet Commonly known as:  NORCO Take 1 tablet by mouth every 6 (six) hours as needed for up to 3 days for moderate pain.   ibuprofen 800 MG tablet Commonly known as:  ADVIL,MOTRIN Take 1 tablet (800 mg total) by mouth every 8 (eight) hours as needed for mild pain or moderate pain.   losartan 50 MG tablet Commonly known as:  COZAAR Take 50 mg by mouth daily.   nystatin cream Commonly known as:  MYCOSTATIN Apply 1 application topically 2 (two) times daily.   nystatin powder Commonly known as:  MYCOSTATIN/NYSTOP APPLY TOPICALLY 4 TIMES DAILY   spironolactone 25 MG tablet Commonly known as:  ALDACTONE Take 12.5 mg by mouth daily.      Follow-up Information    Kountze, Caidynce Muzyka, DO. Go on 03/04/2018.   Specialty:  Surgery Why:  @10 :30am  Contact information: [address] [place] [address] [phone]        Georgianne Fick,  Conan Bowens, MD. Daphane Shepherd on 03/04/2018.   Specialty:  Obstetrics and Gynecology Why:  @2 :50pm for followup endomtrial biopsy results Contact information: [place] [address] [phone]        Isaias Cowman, MD. Go on 02/21/2018.   Specialty:  Cardiology Why:  @10  for pacemaker check and s/p admission for lap appy, documented SVTs while inpatient Contact information: [facility] [place] [address] [phone]            Total time spent arranging discharge was >19min. Signed: Benjamine Sprague 02/18/2018, 5:20 PM

## 2018-02-18 NOTE — Progress Notes (Signed)
Noted a run of v tach  Pt stable  Blood pressure good

## 2018-02-18 NOTE — Progress Notes (Signed)
Nsg Discharge Note  Admit Date:  02/17/2018 Discharge date: 02/18/2018   BRIANNA BENNETT to be D/C'd Home per MD order.  AVS completed.  Copy for chart, and copy for patient signed, and dated. Patient/caregiver able to verbalize understanding.  Discharge Medication: Allergies as of 02/18/2018      Reactions   Levaquin [levofloxacin] Anaphylaxis   Amoxicillin Hives   Nitrofuran Derivatives Other (See Comments)   Reaction: unknown   Penicillins Other (See Comments)   Thinks it made her itch a lot, but isn't sure.   Zithromax [azithromycin] Other (See Comments)   Antihistamines, Chlorpheniramine-type Rash      Medication List    TAKE these medications   acetaminophen 500 MG tablet Commonly known as:  TYLENOL Take 500-1,000 mg by mouth every 8 (eight) hours as needed for mild pain or headache. What changed:  Another medication with the same name was added. Make sure you understand how and when to take each.   acetaminophen 325 MG tablet Commonly known as:  TYLENOL Take 2 tablets (650 mg total) by mouth every 8 (eight) hours as needed for up to 30 days for mild pain. What changed:  You were already taking a medication with the same name, and this prescription was added. Make sure you understand how and when to take each.   aspirin EC 81 MG tablet Take 81 mg by mouth daily. Notes to patient:  Restart on wed, 02/20/2018   atorvastatin 20 MG tablet Commonly known as:  LIPITOR Take 1 tablet (20 mg total) by mouth daily.   carvedilol 25 MG tablet Commonly known as:  COREG Take 25 mg by mouth 2 (two) times daily.   docusate sodium 100 MG capsule Commonly known as:  COLACE Take 1 capsule (100 mg total) by mouth 2 (two) times daily as needed for up to 10 days for mild constipation.   HYDROcodone-acetaminophen 5-325 MG tablet Commonly known as:  NORCO Take 1 tablet by mouth every 6 (six) hours as needed for up to 3 days for moderate pain.   ibuprofen 800 MG tablet Commonly known as:   ADVIL,MOTRIN Take 1 tablet (800 mg total) by mouth every 8 (eight) hours as needed for mild pain or moderate pain.   losartan 50 MG tablet Commonly known as:  COZAAR Take 50 mg by mouth daily.   nystatin cream Commonly known as:  MYCOSTATIN Apply 1 application topically 2 (two) times daily.   nystatin powder Commonly known as:  MYCOSTATIN/NYSTOP APPLY TOPICALLY 4 TIMES DAILY   spironolactone 25 MG tablet Commonly known as:  ALDACTONE Take 12.5 mg by mouth daily.       Discharge Assessment: Vitals:   02/18/18 0457 02/18/18 0925  BP: 126/60 (!) 127/55  Pulse: 76 79  Resp: 20 18  Temp: 97.8 F (36.6 C) 98.2 F (36.8 C)  SpO2: 91% 92%   Skin clean, dry and intact without evidence of skin break down, no evidence of skin tears noted. IV catheter discontinued intact. Site without signs and symptoms of complications - no redness or edema noted at insertion site, patient denies c/o pain - only slight tenderness at site.  Dressing with slight pressure applied.  D/c Instructions-Education: Discharge instructions given to patient/family with verbalized understanding. D/c education completed with patient/family including follow up instructions, medication list, d/c activities limitations if indicated, with other d/c instructions as indicated by MD - patient able to verbalize understanding, all questions fully answered. Patient instructed to return to ED, call 911, or  call MD for any changes in condition.  Patient escorted via [place], and D/C home via private auto.  Eda Keys, RN 02/18/2018 11:26 AM

## 2018-02-18 NOTE — Progress Notes (Signed)
Pt refused to work with PT

## 2018-02-19 LAB — SURGICAL PATHOLOGY

## 2018-02-19 LAB — HIV ANTIBODY (ROUTINE TESTING W REFLEX): HIV Screen 4th Generation wRfx: NONREACTIVE

## 2018-02-26 NOTE — Anesthesia Postprocedure Evaluation (Signed)
Anesthesia Post Note  Patient: Maria Neal  Procedure(s) Performed: EXAM UNDER ANESTHESIA with endometrial biopsy (Vagina ) APPENDECTOMY LAPAROSCOPIC (N/A Abdomen)  Patient location during evaluation: PACU Anesthesia Type: General Level of consciousness: awake and alert and oriented Pain management: pain level controlled Vital Signs Assessment: post-procedure vital signs reviewed and stable Respiratory status: spontaneous breathing Cardiovascular status: blood pressure returned to baseline Anesthetic complications: no     Last Vitals:  Vitals:   02/18/18 0925 02/18/18 1130  BP: (!) 127/55 123/61  Pulse: 79 67  Resp: 18 20  Temp: 36.8 C 36.4 C  SpO2: 92% 93%    Last Pain:  Vitals:   02/18/18 1130  TempSrc: Oral  PainSc:                  Allah Reason

## 2018-03-04 ENCOUNTER — Encounter: Payer: Self-pay | Admitting: Obstetrics and Gynecology

## 2018-03-04 ENCOUNTER — Ambulatory Visit (INDEPENDENT_AMBULATORY_CARE_PROVIDER_SITE_OTHER): Payer: Medicare Other | Admitting: Obstetrics and Gynecology

## 2018-03-04 VITALS — BP 124/62 | HR 62 | Ht 64.0 in | Wt 199.0 lb

## 2018-03-04 DIAGNOSIS — N840 Polyp of corpus uteri: Secondary | ICD-10-CM

## 2018-03-05 ENCOUNTER — Encounter: Payer: Self-pay | Admitting: Obstetrics and Gynecology

## 2018-03-05 ENCOUNTER — Ambulatory Visit: Payer: Medicare Other | Admitting: Obstetrics and Gynecology

## 2018-03-05 ENCOUNTER — Ambulatory Visit (INDEPENDENT_AMBULATORY_CARE_PROVIDER_SITE_OTHER): Payer: Medicare Other

## 2018-03-05 VITALS — BP 114/62 | HR 74 | Wt 200.0 lb

## 2018-03-05 DIAGNOSIS — N83201 Unspecified ovarian cyst, right side: Secondary | ICD-10-CM | POA: Diagnosis not present

## 2018-03-05 DIAGNOSIS — N840 Polyp of corpus uteri: Secondary | ICD-10-CM

## 2018-03-05 DIAGNOSIS — N950 Postmenopausal bleeding: Secondary | ICD-10-CM

## 2018-03-05 NOTE — Progress Notes (Signed)
Postoperative Follow-up Patient presents post op from endometrial biopsy 2weeks ago for PMB.  Subjective: Patient reports marked improvement in her preop symptoms. Eating a regular diet without difficulty. Pain is controlled without any medications.  Activity: normal activities of daily living.  Objective: Blood pressure 124/62, pulse 62, height 5\' 4"  (1.626 m), weight 199 lb (90.3 kg).  General: NAD HEENT: normocephalic, anicteric Pulmonary: no increased work of breathing Extremities: no edema Neurologic: normal gait   Admission on 02/17/2018, Discharged on 02/18/2018  Component Date Value Ref Range Status  . WBC 02/17/2018 7.2  4.0 - 10.5 K/uL Final  . RBC 02/17/2018 4.91  3.87 - 5.11 MIL/uL Final  . Hemoglobin 02/17/2018 13.8  12.0 - 15.0 g/dL Final  . HCT 02/17/2018 43.8  36.0 - 46.0 % Final  . MCV 02/17/2018 89.2  80.0 - 100.0 fL Final  . MCH 02/17/2018 28.1  26.0 - 34.0 pg Final  . MCHC 02/17/2018 31.5  30.0 - 36.0 g/dL Final  . RDW 02/17/2018 15.0  11.5 - 15.5 % Final  . Platelets 02/17/2018 228  150 - 400 K/uL Final  . nRBC 02/17/2018 0.0  0.0 - 0.2 % Final  . Neutrophils Relative % 02/17/2018 55  % Final  . Neutro Abs 02/17/2018 4.0  1.7 - 7.7 K/uL Final  . Lymphocytes Relative 02/17/2018 31  % Final  . Lymphs Abs 02/17/2018 2.2  0.7 - 4.0 K/uL Final  . Monocytes Relative 02/17/2018 8  % Final  . Monocytes Absolute 02/17/2018 0.6  0.1 - 1.0 K/uL Final  . Eosinophils Relative 02/17/2018 5  % Final  . Eosinophils Absolute 02/17/2018 0.3  0.0 - 0.5 K/uL Final  . Basophils Relative 02/17/2018 1  % Final  . Basophils Absolute 02/17/2018 0.1  0.0 - 0.1 K/uL Final  . Immature Granulocytes 02/17/2018 0  % Final  . Abs Immature Granulocytes 02/17/2018 0.02  0.00 - 0.07 K/uL Final   Performed at [facility], [address] [place], [place] [address]  . Sodium 02/17/2018 139  135 - 145 mmol/L Final  . Potassium 02/17/2018 4.9  3.5 - 5.1 mmol/L Final  .  Chloride 02/17/2018 100  98 - 111 mmol/L Final  . CO2 02/17/2018 31  22 - 32 mmol/L Final  . Glucose, Bld 02/17/2018 128* 70 - 99 mg/dL Final  . BUN 02/17/2018 11  8 - 23 mg/dL Final  . Creatinine, Ser 02/17/2018 0.81  0.44 - 1.00 mg/dL Final  . Calcium 02/17/2018 9.1  8.9 - 10.3 mg/dL Final  . Total Protein 02/17/2018 7.7  6.5 - 8.1 g/dL Final  . Albumin 02/17/2018 4.5  3.5 - 5.0 g/dL Final  . AST 02/17/2018 24  15 - 41 U/L Final  . ALT 02/17/2018 16  0 - 44 U/L Final  . Alkaline Phosphatase 02/17/2018 73  38 - 126 U/L Final  . Total Bilirubin 02/17/2018 1.3* 0.3 - 1.2 mg/dL Final  . GFR calc non Af Amer 02/17/2018 >60  >60 mL/min Final  . GFR calc Af Amer 02/17/2018 >60  >60 mL/min Final  . Anion gap 02/17/2018 8  5 - 15 Final   Performed at [facility], [address], [place], [place] [address]  . Color, Urine 02/17/2018 YELLOW* YELLOW Final  . APPearance 02/17/2018 CLEAR* CLEAR Final  . Specific Gravity, Urine 02/17/2018 1.013  1.005 - 1.030 Final  . pH 02/17/2018 7.0  5.0 - 8.0 Final  . Glucose, UA 02/17/2018 NEGATIVE  NEGATIVE mg/dL Final  . Hgb urine dipstick 02/17/2018 MODERATE* NEGATIVE Final  . Bilirubin Urine 02/17/2018 NEGATIVE  NEGATIVE Final  . Ketones, ur 02/17/2018 NEGATIVE  NEGATIVE mg/dL Final  . Protein, ur 02/17/2018 NEGATIVE  NEGATIVE mg/dL Final  . Nitrite 02/17/2018 NEGATIVE  NEGATIVE Final  . Chalmers Guest 02/17/2018 NEGATIVE  NEGATIVE Final  . RBC / HPF 02/17/2018 >50* 0 - 5 RBC/hpf Final  . WBC, UA 02/17/2018 0-5  0 - 5 WBC/hpf Final  . Bacteria, UA 02/17/2018 NONE SEEN  NONE SEEN Final  . Squamous Epithelial / LPF 02/17/2018 0-5  0 - 5 Final   Performed at [facility], [address] [place], [place] [address]  . Lipase 02/17/2018 29  11 - 51 U/L Final   Performed at [facility], [address] [place], [place] [address]  . HIV Screen 4th Generation wRfx 02/18/2018 Non Reactive  Non Reactive Final   Comment:  (NOTE) Performed At: [facility] [place], [place] [id number] Rush Farmer MD TZ:[id number]   . Sodium 02/18/2018 138  135 - 145 mmol/L Final  . Potassium 02/18/2018 4.4  3.5 - 5.1 mmol/L Final  . Chloride 02/18/2018 101  98 - 111 mmol/L Final  . CO2 02/18/2018 28  22 - 32 mmol/L Final  . Glucose, Bld 02/18/2018 146* 70 - 99 mg/dL Final  . BUN 02/18/2018 12  8 - 23 mg/dL Final  . Creatinine, Ser 02/18/2018 0.94  0.44 - 1.00 mg/dL Final  . Calcium 02/18/2018 8.9  8.9 - 10.3 mg/dL Final  . GFR calc non Af Amer 02/18/2018 >60  >60 mL/min Final  . GFR calc Af Amer 02/18/2018 >60  >60 mL/min Final  . Anion gap 02/18/2018 9  5 - 15 Final   Performed at [facility], [address] [place], [place] [address]  . Magnesium 02/18/2018 1.9  1.7 - 2.4 mg/dL Final   Performed at [facility], [address] [place], [place] [address]  . Phosphorus 02/18/2018 4.5  2.5 - 4.6 mg/dL Final   Performed at [facility], [address] [place], [place] [address]  . WBC 02/18/2018 7.0  4.0 - 10.5 K/uL Final  . RBC 02/18/2018 4.53  3.87 - 5.11 MIL/uL Final  . Hemoglobin 02/18/2018 12.9  12.0 - 15.0 g/dL Final  . HCT 02/18/2018 40.7  36.0 - 46.0 % Final  . MCV 02/18/2018 89.8  80.0 - 100.0 fL Final  . MCH 02/18/2018 28.5  26.0 - 34.0 pg Final  . MCHC 02/18/2018 31.7  30.0 - 36.0 g/dL Final  . RDW 02/18/2018 14.9  11.5 - 15.5 % Final  . Platelets 02/18/2018 172  150 - 400 K/uL Final  . nRBC 02/18/2018 0.0  0.0 - 0.2 % Final   Performed at [facility], [address], [place], [place] [address]  . MRSA, PCR 02/17/2018 POSITIVE* NEGATIVE Final   Comment: RESULT CALLED TO, READ BACK BY AND VERIFIED WITH: CRYSTAL BASS 02/17/18 @ 2140  [place]   . Staphylococcus aureus 02/17/2018 POSITIVE* NEGATIVE Final   Comment: (NOTE) The Xpert SA Assay (FDA approved for NASAL specimens in patients 51 years of age and older), is one component of a  comprehensive surveillance program. It is not intended to diagnose infection nor to guide or monitor treatment. Performed at [facility], [address], [place], [place] [address]   . SURGICAL PATHOLOGY 02/17/2018    Final  Value:Surgical Pathology CASE: [id number] PATIENT: Larrisha Damiano Surgical Pathology Report     SPECIMEN SUBMITTED: A. Endometrium, bx B. Appendix  CLINICAL HISTORY: None provided  PRE-OPERATIVE DIAGNOSIS: Acute appendicitis, post menopausal bleeding with thickened endometrium  POST-OPERATIVE DIAGNOSIS: Same as pre op     DIAGNOSIS: A. ENDOMETRIUM; BIOPSY: - FRAGMENTS OF INFARCTED ENDOMETRIAL POLYP. - SUPERFICIAL STRIPS OF INACTIVE ENDOMETRIAL TISSUE. - NEGATIVE FOR ATYPICAL HYPERPLASIA/EIN AND MALIGNANCY.  B. APPENDIX; APPENDECTOMY: - ACUTE APPENDICITIS WITH MILD SEROSITIS AND FIBRINOUS ADHESIONS. - NEGATIVE FOR MALIGNANCY.   GROSS DESCRIPTION: A. Labeled: Endometrial biopsy Received: Formalin Tissue fragment(s): Multiple Size: Aggregate, 4.0 x 2.5 x 0.3 cm Description: Mucinous/hemorrhagic material admixed with possible soft tissue.  Filtered into mesh bags. Entirely submitted in cassettes 1-2.  B. Labeled: Appendix Received: Formalin Size: 4.0                          cm in length by 0.8 cm in diameter with attached mesoappendix up to 1.2 cm in thickness External surface: Tan-pink and smooth with multifocal areas of hemorrhage Perforation: No site of perforation is grossly identified. Fecalith: Absent Description: The proximal margin is inked blue.  Sectioning displays a patent, pinpoint lumen containing minimal, hemorrhagic material.  The mucosa is tan-pink with multifocal areas of hemorrhage.  Block summary: 1 - appendiceal resection margin (perpendicular), tip (representative), and cross-sections   Final Diagnosis performed by Allena Napoleon, MD.   Electronically signed 02/19/2018 12:54:36PM The  electronic signature indicates that the named Attending Pathologist has evaluated the specimen  Technical component performed at [facility], [address] [place], [place] [address] Lab: [phone] Dir: Rush Farmer, MD, MMM  Professional component performed at [facility], [facility], [place], [place], [place] Lab: [phone] Dir: Dellia Nims. Rubinas, MD     Assessment: 68 y.o. s/p endometrial biopsy stable  Plan: Patient has done well after surgery with no apparent complications.  I have discussed the post-operative course to date, and the expected progress moving forward.  The patient understands what complications to be concerned about.  I will see the patient in routine follow up, or sooner if needed.    Activity plan: No restriction.  Given finding of necotic polyp recommend ultrasound to evaluate for any residual polyp.  If present discussed hysteroscopy D&C   Malachy Mood, MD, [place], [place]

## 2018-03-05 NOTE — Progress Notes (Signed)
Gynecology Ultrasound Follow Up  Chief Complaint:  Chief Complaint  Patient presents with  . Follow-up    Gyn ultrasound     History of Present Illness: Patient is a 68 y.o. female who presents today for ultrasound evaluation of endometrial polyp on recent endometrial biopsy, postmenopausal bleeding .  Ultrasound demonstrates the following findgins Adnexa: no masses seen Uterus: Non-enlarged with endometrial stripe  5.73mm but containing complex material consistent with residual polyp Additional: no free fluid  Review of Systems: Review of Systems  Constitutional: Negative.   Gastrointestinal: Negative.   Skin: Negative.   Endo/Heme/Allergies: Bruises/bleeds easily.    Past Medical History:  Past Medical History:  Diagnosis Date  . AICD (automatic cardioverter/defibrillator) present   . Anxiety   . CAD (coronary artery disease)   . Cardiomyopathy ([place])   . CHF (congestive heart failure) ([place])   . COPD (chronic obstructive pulmonary disease) ([place])   . Depression   . Dyspnea   . Headache   . History of shingles   . Hypertension   . Insomnia   . On home oxygen therapy    bedtime and prn  . Restless leg syndrome     Past Surgical History:  Past Surgical History:  Procedure Laterality Date  . CARDIAC CATHETERIZATION  04/2014   Dr. Idelle Leech  . CARDIAC CATHETERIZATION    . CARDIAC DEFIBRILLATOR PLACEMENT    . CHOLECYSTECTOMY N/A 08/23/2016   Procedure: LAPAROSCOPIC CHOLECYSTECTOMY;  Surgeon: Clayburn Pert, MD;  Location: [facility] ORS;  Service: General;  Laterality: N/A;  . HERNIA REPAIR    . LAPAROSCOPIC APPENDECTOMY N/A 02/17/2018   Procedure: APPENDECTOMY LAPAROSCOPIC;  Surgeon: Benjamine Sprague, DO;  Location: [facility] ORS;  Service: General;  Laterality: N/A;    Gynecologic History:  No LMP recorded. Patient is postmenopausal.   Family History:  Family History  Problem Relation Age of Onset  . Diabetes Mellitus II Mother   . Hypertension Mother   . Heart  attack Father   . Heart disease Brother   . Arthritis Sister   . Depression Daughter   . Depression Son   . Schizophrenia Son   . Arthritis Sister   . Diabetes Brother     Social History:  Social History   Socioeconomic History  . Marital status: Divorced    Spouse name: Not on file  . Number of children: Not on file  . Years of education: Not on file  . Highest education level: Not on file  Occupational History  . Not on file  Social Needs  . Financial resource strain: Not hard at all  . Food insecurity:    Worry: Never true    Inability: Never true  . Transportation needs:    Medical: No    Non-medical: No  Tobacco Use  . Smoking status: Former Smoker    Packs/day: 0.25    Types: Cigarettes  . Smokeless tobacco: Never Used  . Tobacco comment: quit at age 58  Substance and Sexual Activity  . Alcohol use: No    Alcohol/week: 0.0 standard drinks  . Drug use: No  . Sexual activity: Not Currently    Birth control/protection: None  Lifestyle  . Physical activity:    Days per week: 0 days    Minutes per session: 0 min  . Stress: Not at all  Relationships  . Social connections:    Talks on phone: More than three times a week    Gets together: Twice a week  Attends religious service: More than 4 times per year    Active member of club or organization: No    Attends meetings of clubs or organizations: Never    Relationship status: Divorced  . Intimate partner violence:    Fear of current or ex partner: No    Emotionally abused: No    Physically abused: No    Forced sexual activity: No  Other Topics Concern  . Not on file  Social History Narrative  . Not on file    Allergies:  Allergies  Allergen Reactions  . Levaquin [Levofloxacin] Anaphylaxis  . Amoxicillin Hives  . Nitrofuran Derivatives Other (See Comments)    Reaction: unknown  . Penicillins Other (See Comments)    Thinks it made her itch a lot, but isn't sure.  Marland Kitchen Zithromax [Azithromycin] Other  (See Comments)  . Antihistamines, Chlorpheniramine-Type Rash    Medications: Prior to Admission medications   Medication Sig Start Date End Date Taking? Authorizing Provider  acetaminophen (TYLENOL) 325 MG tablet Take 2 tablets (650 mg total) by mouth every 8 (eight) hours as needed for up to 30 days for mild pain. 02/18/18 03/20/18  Benjamine Sprague, DO  acetaminophen (TYLENOL) 500 MG tablet Take 500-1,000 mg by mouth every 8 (eight) hours as needed for mild pain or headache.     [provider]  aspirin EC 81 MG tablet Take 81 mg by mouth daily.    [provider]  atorvastatin (LIPITOR) 20 MG tablet Take 1 tablet (20 mg total) by mouth daily. 05/02/17   Kathrine Haddock, NP  carvedilol (COREG) 25 MG tablet Take 25 mg by mouth 2 (two) times daily. 04/12/17   [provider]  ibuprofen (ADVIL,MOTRIN) 800 MG tablet Take 1 tablet (800 mg total) by mouth every 8 (eight) hours as needed for mild pain or moderate pain. 02/18/18   Lysle Pearl, Isami, DO  losartan (COZAAR) 50 MG tablet Take 50 mg by mouth daily. 04/10/17   [provider]  nystatin (MYCOSTATIN/NYSTOP) powder APPLY TOPICALLY 4 TIMES DAILY 11/05/17   Volney American, PA-C  nystatin cream (MYCOSTATIN) Apply 1 application topically 2 (two) times daily. 07/03/17   Volney American, PA-C  spironolactone (ALDACTONE) 25 MG tablet Take 12.5 mg by mouth daily. 04/09/17   [provider]    Physical Exam Vitals: Blood pressure 114/62, pulse 74, weight 200 lb (90.7 kg).  General: NAD HEENT: normocephalic, anicteric Pulmonary: No increased work of breathing Extremities: no edema, erythema, or tenderness Neurologic: Grossly intact, normal gait Psychiatric: mood appropriate, affect full   Assessment: 68 y.o. No obstetric history on file. No problem-specific Assessment & Plan notes found for this encounter.   Plan: Problem List Items Addressed This Visit    None      1) I have discussed with the  patient the indications for the procedure. Included in the discussion were the options of therapy, as wall as their individual risks, benefits, and complications. Ample time was given to answer all questions.   In office pipelle biopsy generally provides comparable results to [facility], however this sampling modality may miss focal lesions if these were previously documented on ultrasound.  It is because of the potential to miss focal lesions that hysteroscopy D&C is also warranted in patient with continued postmenopausal bleeding that is not self limited regardless of prior in office biopsy results or ultrasound findings.  She understands that the risk of continued observation include worsening bleeding or worsening of any underlying pathology.  The  choices include: 1. Doing nothing but following her symptoms 2. Attempts at hormonal manipulation with either BCP or Depo-Provera for premenopausal patients with no concern for focal lesion or endometrial pathology 3. D&C/hysteroscopy. 4. Endometrial ablation via Novasure or other techniques for premenopausal patients with no concern for focal lesion or endometrial pathology  5. As final resort, hysterectomy. After consideration of her history and findings, mutual decision has been made to proceed with D+C/hysteroscopy. While the incidence is low, the risks from this surgery include, but are not limited to, the risks of anesthesia, hemorrhage, infection, perforation, and injury to adjacent structures including bowel, bladder and blood vessels.   2) A total of 15 minutes were spent in face-to-face contact with the patient during this encounter with over half of that time devoted to counseling and coordination of care.  3) Return if symptoms worsen or fail to improve.    Malachy Mood, MD, [facility], [place]

## 2018-03-07 ENCOUNTER — Telehealth: Payer: Self-pay | Admitting: Obstetrics and Gynecology

## 2018-03-07 NOTE — Telephone Encounter (Signed)
No answer, no v/m, no MyChart.

## 2018-03-07 NOTE — Telephone Encounter (Signed)
Daughter [name] (on [place]) returned call w/ patient. Patient did not have questions about the procedure and chose consents day of surgery, is aware of Pre-admit Testing to be scheduled, and OR on 03/21/18. Patient is aware she may receive calls from the [facility] and  [facility]. Patient confirmed [facility] Medicare and no other insurance. Patient's daughter [name] plans to drive patient on day of surgery. Patient saw Dr Saralyn Pilar last week, and is aware anesthesia may ask for additional clearance.

## 2018-03-07 NOTE — Telephone Encounter (Signed)
-----   Message from Malachy Mood, MD sent at 03/06/2018  8:01 AM EST ----- Surgery Date:   LOS: same day surgery  Surgery Booking Request Patient Full Name: Maria Neal MRN: [id number]  DOB: 1950/06/06  Surgeon: Malachy Mood, MD  Requested Surgery Date and Time: 1-4 weeks Primary Diagnosis and Code: Endometrial polyp Secondary Diagnosis and Code:  Surgical Procedure: Hysteroscopy, D&C L&D Notification:N/A Admission Status: same day surgery Length of Surgery: 1hr Special Case Needs: none H&P: week of or same day (date) Phone Interview or Office Pre-Admit: pre-admit Interpreter: No Language: English Medical Clearance: Yes (Dr. Miquel Dunn - cardiology).  She recently had appendectomy so not sure if anesthesia wants additional clearance Special Scheduling Instructions: none

## 2018-03-08 ENCOUNTER — Telehealth: Payer: Self-pay | Admitting: Obstetrics and Gynecology

## 2018-03-08 NOTE — Telephone Encounter (Signed)
Patient is aware of Pre-admit Testing appointment on Friday, 03/15/18 @ 10:45am at [facility]. Directions given.

## 2018-03-15 ENCOUNTER — Other Ambulatory Visit: Payer: Self-pay

## 2018-03-15 ENCOUNTER — Encounter
Admission: RE | Admit: 2018-03-15 | Discharge: 2018-03-15 | Disposition: A | Discharge status: Home / Self Care | Payer: Medicare Other | Source: Ambulatory Visit | Attending: Obstetrics and Gynecology | Admitting: Obstetrics and Gynecology

## 2018-03-15 ENCOUNTER — Other Ambulatory Visit
Admission: RE | Admit: 2018-03-15 | Discharge: 2018-03-15 | Disposition: A | Discharge status: Home / Self Care | Payer: Medicare Other | Source: Ambulatory Visit | Attending: Obstetrics and Gynecology | Admitting: Obstetrics and Gynecology

## 2018-03-15 HISTORY — DX: Personal history of urinary calculi: Z87.442

## 2018-03-15 NOTE — Pre-Procedure Instructions (Signed)
Cardiac device programming sheet and clearance request faxed to Dr Saralyn Pilar office

## 2018-03-15 NOTE — Patient Instructions (Addendum)
Your procedure is scheduled on: MARCH 19 , 2020 THURSDAY Report to Day Surgery on the 2nd floor of the [facility]. To find out your arrival time, please call [phone] between 1PM - 3PM on: March 20, 2018 [facility]  REMEMBER: Instructions that are not followed completely may result in serious medical risk, up to and including death; or upon the discretion of your surgeon and anesthesiologist your surgery may need to be rescheduled.  Do not eat food after midnight the night before surgery.  No gum chewing, lozengers or hard candies.  You may however, drink CLEAR liquids up to 2 hours before you are scheduled to arrive for your surgery. Do not drink anything within 2 hours of the start of your surgery.  Clear liquids include: - water  - apple juice without pulp - CLEAR gatorade - black coffee or tea (Do NOT add milk or creamers to the coffee or tea) Do NOT drink anything that is not on this list.  Type 1 and Type 2 diabetics should only drink water.  ENSURE PRE-SURGERY CARBOHYDRATE DRINK:   (he wants it 3 hours before arrival to the hospital.)  No Alcohol for 24 hours before or after surgery.  No Smoking including e-cigarettes for 24 hours prior to surgery.  No chewable tobacco products for at least 6 hours prior to surgery.  No nicotine patches on the day of surgery.  On the morning of surgery brush your teeth with toothpaste and water, you may rinse your mouth with mouthwash if you wish. Do not swallow any toothpaste or mouthwash.  Notify your doctor if there is any change in your medical condition (cold, fever, infection).  Do not wear jewelry, make-up, hairpins, clips or nail polish.  Do not wear lotions, powders, or perfumes.   Do not shave 48 hours prior to surgery.   Contacts and dentures may not be worn into surgery.  Do not bring valuables to the hospital, including drivers license, insurance or credit cards.  [facility] is not responsible for any belongings  or valuables.   TAKE THESE MEDICATIONS THE MORNING OF SURGERY: CARVEDILOL SPIRONOLACTONE ATORVASTATIN  DO NOT TAKE LOSARTAN MORNING OF SURGERY  SHOWER MORNING OF SURGERY DIAL SOAP   USE INHALER DAY OF SURGERY  Follow recommendations from Cardiologist, Pulmonologist or PCP regarding stopping Aspirin,   Stop Anti-inflammatories (NSAIDS) such as Advil, Aleve, Ibuprofen, Motrin, Naproxen, Naprosyn and Aspirin based products such as Excedrin, Goodys Powder, BC Powder. (May take Tylenol or Acetaminophen if needed.)  Stop ANY OVER THE COUNTER supplements until after surgery. (May continue Vitamin D, Vitamin B, and multivitamin.)  Wear comfortable clothing (specific to your surgery type) to the hospital.  Plan for stool softeners for home use.  If you are being discharged the day of surgery, you will not be allowed to drive home. You will need a responsible adult to drive you home and stay with you that night.   If you are taking public transportation, you will need to have a responsible adult with you. Please confirm with your physician that it is acceptable to use public transportation.   Please call [phone] if you have any questions about these instructions.

## 2018-03-19 NOTE — Pre-Procedure Instructions (Signed)
Clearance and pacemaker sheet on chart

## 2018-03-21 ENCOUNTER — Ambulatory Visit
Admission: RE | Admit: 2018-03-21 | Payer: Medicare Other | Source: Home / Self Care | Admitting: Obstetrics and Gynecology

## 2018-03-21 ENCOUNTER — Encounter: Admission: RE | Payer: Self-pay | Source: Home / Self Care

## 2018-03-21 SURGERY — DILATATION & CURETTAGE/HYSTEROSCOPY WITH MYOSURE
Anesthesia: Choice

## 2018-04-01 ENCOUNTER — Telehealth: Payer: Self-pay

## 2018-04-01 NOTE — Telephone Encounter (Signed)
Called pt no answer °

## 2018-04-01 NOTE — Telephone Encounter (Signed)
So I am not seeing her for anything that may cause pain so that would be something to bring up to her PCP

## 2018-04-01 NOTE — Telephone Encounter (Signed)
Pt calling triage line wanting something for pain, her surgery was canceled, pt having pelvic pain that goes to her back.

## 2018-04-04 ENCOUNTER — Other Ambulatory Visit: Payer: Self-pay

## 2018-04-04 NOTE — Patient Outreach (Signed)
[facility]) Care Management  04/04/2018  CORNESHIA HINES March 21, 1950 [id number]   Medication Adherence call to Mrs. Colin Ina Hippa Identifiers Verify spoke with patient she is past due on Losartan 50 mg she explain she has plenty of medication and at some point the pharmacy gave her two bottle of this medication and at this time she has plenty she assure that she wont go with out any medication patient is still taking 1 tablet daily. Mrs. Spiewak is showing past due under [facility].   [facility] Management Direct Dial [phone]  Fax [phone] [email or web address]

## 2018-04-23 ENCOUNTER — Other Ambulatory Visit: Payer: Self-pay

## 2018-04-23 ENCOUNTER — Ambulatory Visit (INDEPENDENT_AMBULATORY_CARE_PROVIDER_SITE_OTHER): Payer: Medicare Other | Admitting: Family Medicine

## 2018-04-23 ENCOUNTER — Encounter: Payer: Self-pay | Admitting: Family Medicine

## 2018-04-23 DIAGNOSIS — J029 Acute pharyngitis, unspecified: Secondary | ICD-10-CM

## 2018-04-23 MED ORDER — DOXYCYCLINE HYCLATE 100 MG PO TABS: 100.0000 mg | ORAL_TABLET | Freq: Two times a day (BID) | ORAL | 0 refills | Status: DC

## 2018-04-23 NOTE — Progress Notes (Signed)
SpO2 (!) 89%    Subjective:    Patient ID: [name], female    DOB: [birth year], 68 y.o.   MRN: [id number]  HPI: MELVINE JULIN is a 68 y.o. female  Chief Complaint  Patient presents with  . Sore Throat    x 4 days.     . This visit was completed via telephone due to the restrictions of the COVID-19 pandemic. All issues as above were discussed and addressed but no physical exam was performed. If it was felt that the patient should be evaluated in the office, they were directed there. The patient verbally consented to this visit. Patient was unable to complete an audio/visual visit due to Technical difficulties,Lack of internet. Due to the catastrophic nature of the COVID-19 pandemic, this visit was done through audio contact only. . Location of the patient: home . Location of the provider: home . Those involved with this call:  . Provider: Merrie Roof, PA-C . CMA: Gerda Diss, CMA . Front Desk/Registration: Jill Side  . Time spent on call: 15 minutes on the phone discussing health concerns. 5 minutes total spent in review of patient's record and preparation of their chart.  I verified patient identity using two factors (patient name and date of birth). Patient consents verbally to being seen via telemedicine visit today.   Patient presents today with 4 days of sore, swollen throat. Denies fevers, chills, dysphagia, N/V, abdominal pain, congestion, post nasal drainage, cough. Trying salt water gargles and OTC throat sprays without relief. No known sick contacts, no recent travel.   Relevant past medical, surgical, family and social history reviewed and updated as indicated. Interim medical history since our last visit reviewed. Allergies and medications reviewed and updated.  Review of Systems  Per HPI unless specifically indicated above     Objective:    SpO2 (!) 89%   Wt Readings from Last 3 Encounters:  03/15/18 201 lb 14.4 oz (91.6 kg)  03/05/18 200 lb (90.7 kg)   03/04/18 199 lb (90.3 kg)    Physical Exam Unable to perform PE as she does not have video conference capabilities Results for orders placed or performed during the hospital encounter of 02/17/18  Surgical PCR screen  Result Value Ref Range   MRSA, PCR POSITIVE (A) NEGATIVE   Staphylococcus aureus POSITIVE (A) NEGATIVE  CBC with Differential  Result Value Ref Range   WBC 7.2 4.0 - 10.5 K/uL   RBC 4.91 3.87 - 5.11 MIL/uL   Hemoglobin 13.8 12.0 - 15.0 g/dL   HCT 43.8 36.0 - 46.0 %   MCV 89.2 80.0 - 100.0 fL   MCH 28.1 26.0 - 34.0 pg   MCHC 31.5 30.0 - 36.0 g/dL   RDW 15.0 11.5 - 15.5 %   Platelets 228 150 - 400 K/uL   nRBC 0.0 0.0 - 0.2 %   Neutrophils Relative % 55 %   Neutro Abs 4.0 1.7 - 7.7 K/uL   Lymphocytes Relative 31 %   Lymphs Abs 2.2 0.7 - 4.0 K/uL   Monocytes Relative 8 %   Monocytes Absolute 0.6 0.1 - 1.0 K/uL   Eosinophils Relative 5 %   Eosinophils Absolute 0.3 0.0 - 0.5 K/uL   Basophils Relative 1 %   Basophils Absolute 0.1 0.0 - 0.1 K/uL   Immature Granulocytes 0 %   Abs Immature Granulocytes 0.02 0.00 - 0.07 K/uL  Comprehensive metabolic panel  Result Value Ref Range   Sodium 139 135 - 145 mmol/L  Potassium 4.9 3.5 - 5.1 mmol/L   Chloride 100 98 - 111 mmol/L   CO2 31 22 - 32 mmol/L   Glucose, Bld 128 (H) 70 - 99 mg/dL   BUN 11 8 - 23 mg/dL   Creatinine, Ser 0.81 0.44 - 1.00 mg/dL   Calcium 9.1 8.9 - 10.3 mg/dL   Total Protein 7.7 6.5 - 8.1 g/dL   Albumin 4.5 3.5 - 5.0 g/dL   AST 24 15 - 41 U/L   ALT 16 0 - 44 U/L   Alkaline Phosphatase 73 38 - 126 U/L   Total Bilirubin 1.3 (H) 0.3 - 1.2 mg/dL   GFR calc non Af Amer >60 >60 mL/min   GFR calc Af Amer >60 >60 mL/min   Anion gap 8 5 - 15  Urinalysis, Routine w reflex microscopic  Result Value Ref Range   Color, Urine YELLOW (A) YELLOW   APPearance CLEAR (A) CLEAR   Specific Gravity, Urine 1.013 1.005 - 1.030   pH 7.0 5.0 - 8.0   Glucose, UA NEGATIVE NEGATIVE mg/dL   Hgb urine dipstick MODERATE  (A) NEGATIVE   Bilirubin Urine NEGATIVE NEGATIVE   Ketones, ur NEGATIVE NEGATIVE mg/dL   Protein, ur NEGATIVE NEGATIVE mg/dL   Nitrite NEGATIVE NEGATIVE   Leukocytes,Ua NEGATIVE NEGATIVE   RBC / HPF >50 (H) 0 - 5 RBC/hpf   WBC, UA 0-5 0 - 5 WBC/hpf   Bacteria, UA NONE SEEN NONE SEEN   Squamous Epithelial / LPF 0-5 0 - 5  Lipase, blood  Result Value Ref Range   Lipase 29 11 - 51 U/L  HIV antibody (Routine Testing)  Result Value Ref Range   HIV Screen 4th Generation wRfx Non Reactive Non Reactive  Basic metabolic panel  Result Value Ref Range   Sodium 138 135 - 145 mmol/L   Potassium 4.4 3.5 - 5.1 mmol/L   Chloride 101 98 - 111 mmol/L   CO2 28 22 - 32 mmol/L   Glucose, Bld 146 (H) 70 - 99 mg/dL   BUN 12 8 - 23 mg/dL   Creatinine, Ser 0.94 0.44 - 1.00 mg/dL   Calcium 8.9 8.9 - 10.3 mg/dL   GFR calc non Af Amer >60 >60 mL/min   GFR calc Af Amer >60 >60 mL/min   Anion gap 9 5 - 15  Magnesium  Result Value Ref Range   Magnesium 1.9 1.7 - 2.4 mg/dL  Phosphorus  Result Value Ref Range   Phosphorus 4.5 2.5 - 4.6 mg/dL  CBC  Result Value Ref Range   WBC 7.0 4.0 - 10.5 K/uL   RBC 4.53 3.87 - 5.11 MIL/uL   Hemoglobin 12.9 12.0 - 15.0 g/dL   HCT 40.7 36.0 - 46.0 %   MCV 89.8 80.0 - 100.0 fL   MCH 28.5 26.0 - 34.0 pg   MCHC 31.7 30.0 - 36.0 g/dL   RDW 14.9 11.5 - 15.5 %   Platelets 172 150 - 400 K/uL   nRBC 0.0 0.0 - 0.2 %  Surgical pathology  Result Value Ref Range   SURGICAL PATHOLOGY      Surgical Pathology CASE: [id number] PATIENT: Ivorie Arizmendi Surgical Pathology Report     SPECIMEN SUBMITTED: A. Endometrium, bx B. Appendix  CLINICAL HISTORY: None provided  PRE-OPERATIVE DIAGNOSIS: Acute appendicitis, post menopausal bleeding with thickened endometrium  POST-OPERATIVE DIAGNOSIS: Same as pre op     DIAGNOSIS: A. ENDOMETRIUM; BIOPSY: - FRAGMENTS OF INFARCTED ENDOMETRIAL POLYP. - SUPERFICIAL STRIPS OF INACTIVE ENDOMETRIAL TISSUE. -  NEGATIVE FOR  ATYPICAL HYPERPLASIA/EIN AND MALIGNANCY.  B. APPENDIX; APPENDECTOMY: - ACUTE APPENDICITIS WITH MILD SEROSITIS AND FIBRINOUS ADHESIONS. - NEGATIVE FOR MALIGNANCY.   GROSS DESCRIPTION: A. Labeled: Endometrial biopsy Received: Formalin Tissue fragment(s): Multiple Size: Aggregate, 4.0 x 2.5 x 0.3 cm Description: Mucinous/hemorrhagic material admixed with possible soft tissue.  Filtered into mesh bags. Entirely submitted in cassettes 1-2.  B. Labeled: Appendix Received: Formalin Size: 4.0  cm in length by 0.8 cm in diameter with attached mesoappendix up to 1.2 cm in thickness External surface: Tan-pink and smooth with multifocal areas of hemorrhage Perforation: No site of perforation is grossly identified. Fecalith: Absent Description: The proximal margin is inked blue.  Sectioning displays a patent, pinpoint lumen containing minimal, hemorrhagic material.  The mucosa is tan-pink with multifocal areas of hemorrhage.  Block summary: 1 - appendiceal resection margin (perpendicular), tip (representative), and cross-sections   Final Diagnosis performed by Allena Napoleon, MD.   Electronically signed 02/19/2018 12:54:36PM The electronic signature indicates that the named Attending Pathologist has evaluated the specimen  Technical component performed at [facility], [address] , [place],  [address] Lab: [phone] Dir: Rush Farmer, MD, MMM  Professional component performed at [facility], [facility], [place], [place], [place] Lab: [phone] Dir: Dellia Nims. Rubinas, MD       Assessment & Plan:   Problem List Items Addressed This Visit    None    Visit Diagnoses    Pharyngitis, unspecified etiology    -  Primary    Unable to perform PE or lab testing to determine whether allergic, bacterial, viral, or reflux/irritation/etc. Given lack of other sxs or changes in routine, suspect bacterial infection and will tx with doxycycline course and  continued gargles. If not improving in next few days recommended she go to UC for in person evaluation.    Follow up plan: Return if symptoms worsen or fail to improve.

## 2018-04-26 ENCOUNTER — Ambulatory Visit: Payer: Self-pay

## 2018-04-29 ENCOUNTER — Ambulatory Visit (INDEPENDENT_AMBULATORY_CARE_PROVIDER_SITE_OTHER): Payer: Medicare Other

## 2018-04-29 ENCOUNTER — Other Ambulatory Visit: Payer: Self-pay

## 2018-04-29 DIAGNOSIS — Z0000 Encounter for general adult medical examination without abnormal findings: Secondary | ICD-10-CM

## 2018-04-29 NOTE — Patient Instructions (Signed)
Maria Neal , Thank you for taking time to come for your Medicare Wellness Visit. I appreciate your ongoing commitment to your health goals. Please review the following plan we discussed and let me know if I can assist you in the future.   Screening recommendations/referrals: Colonoscopy: declined Mammogram: declined Bone Density: declined Recommended yearly ophthalmology/optometry visit for glaucoma screening and checkup Recommended yearly dental visit for hygiene and checkup  Vaccinations: Influenza vaccine: up to date Pneumococcal vaccine: up to date Tdap vaccine: up to date Shingles vaccine: up to date    Advanced directives: Please pick up information at the office to fill out at your convenience.   Conditions/risks identified: Continue to use oxygen when needed  Next appointment: follow up in one year for your annual wellness exam.    Preventive Care 65 Years and Older, Female Preventive care refers to lifestyle choices and visits with your health care provider that can promote health and wellness. What does preventive care include?  A yearly physical exam. This is also called an annual well check.  Dental exams once or twice a year.  Routine eye exams. Ask your health care provider how often you should have your eyes checked.  Personal lifestyle choices, including:  Daily care of your teeth and gums.  Regular physical activity.  Eating a healthy diet.  Avoiding tobacco and drug use.  Limiting alcohol use.  Practicing safe sex.  Taking low-dose aspirin every day.  Taking vitamin and mineral supplements as recommended by your health care provider. What happens during an annual well check? The services and screenings done by your health care provider during your annual well check will depend on your age, overall health, lifestyle risk factors, and family history of disease. Counseling  Your health care provider may ask you questions about your:  Alcohol use.   Tobacco use.  Drug use.  Emotional well-being.  Home and relationship well-being.  Sexual activity.  Eating habits.  History of falls.  Memory and ability to understand (cognition).  Work and work Statistician.  Reproductive health. Screening  You may have the following tests or measurements:  Height, weight, and BMI.  Blood pressure.  Lipid and cholesterol levels. These may be checked every 5 years, or more frequently if you are over 9 years old.  Skin check.  Lung cancer screening. You may have this screening every year starting at age 27 if you have a 30-pack-year history of smoking and currently smoke or have quit within the past 15 years.  Fecal occult blood test (FOBT) of the stool. You may have this test every year starting at age 86.  Flexible sigmoidoscopy or colonoscopy. You may have a sigmoidoscopy every 5 years or a colonoscopy every 10 years starting at age 35.  Hepatitis C blood test.  Hepatitis B blood test.  Sexually transmitted disease (STD) testing.  Diabetes screening. This is done by checking your blood sugar (glucose) after you have not eaten for a while (fasting). You may have this done every 1-3 years.  Bone density scan. This is done to screen for osteoporosis. You may have this done starting at age 9.  Mammogram. This may be done every 1-2 years. Talk to your health care provider about how often you should have regular mammograms. Talk with your health care provider about your test results, treatment options, and if necessary, the need for more tests. Vaccines  Your health care provider may recommend certain vaccines, such as:  Influenza vaccine. This is recommended every  year.  Tetanus, diphtheria, and acellular pertussis (Tdap, Td) vaccine. You may need a Td booster every 10 years.  Zoster vaccine. You may need this after age 30.  Pneumococcal 13-valent conjugate (PCV13) vaccine. One dose is recommended after age 30.  Pneumococcal  polysaccharide (PPSV23) vaccine. One dose is recommended after age 51. Talk to your health care provider about which screenings and vaccines you need and how often you need them. This information is not intended to replace advice given to you by your health care provider. Make sure you discuss any questions you have with your health care provider. Document Released: 01/15/2015 Document Revised: 09/08/2015 Document Reviewed: 10/20/2014 Elsevier Interactive Patient Education  2017 [place] Prevention in the Home Falls can cause injuries. They can happen to people of all ages. There are many things you can do to make your home safe and to help prevent falls. What can I do on the outside of my home?  Regularly fix the edges of walkways and driveways and fix any cracks.  Remove anything that might make you trip as you walk through a door, such as a raised step or threshold.  Trim any bushes or trees on the path to your home.  Use bright outdoor lighting.  Clear any walking paths of anything that might make someone trip, such as rocks or tools.  Regularly check to see if handrails are loose or broken. Make sure that both sides of any steps have handrails.  Any raised decks and porches should have guardrails on the edges.  Have any leaves, snow, or ice cleared regularly.  Use sand or salt on walking paths during winter.  Clean up any spills in your garage right away. This includes oil or grease spills. What can I do in the bathroom?  Use night lights.  Install grab bars by the toilet and in the tub and shower. Do not use towel bars as grab bars.  Use non-skid mats or decals in the tub or shower.  If you need to sit down in the shower, use a plastic, non-slip stool.  Keep the floor dry. Clean up any water that spills on the floor as soon as it happens.  Remove soap buildup in the tub or shower regularly.  Attach bath mats securely with double-sided non-slip rug tape.   Do not have throw rugs and other things on the floor that can make you trip. What can I do in the bedroom?  Use night lights.  Make sure that you have a light by your bed that is easy to reach.  Do not use any sheets or blankets that are too big for your bed. They should not hang down onto the floor.  Have a firm chair that has side arms. You can use this for support while you get dressed.  Do not have throw rugs and other things on the floor that can make you trip. What can I do in the kitchen?  Clean up any spills right away.  Avoid walking on wet floors.  Keep items that you use a lot in easy-to-reach places.  If you need to reach something above you, use a strong step stool that has a grab bar.  Keep electrical cords out of the way.  Do not use floor polish or wax that makes floors slippery. If you must use wax, use non-skid floor wax.  Do not have throw rugs and other things on the floor that can make you trip. What can  I do with my stairs?  Do not leave any items on the stairs.  Make sure that there are handrails on both sides of the stairs and use them. Fix handrails that are broken or loose. Make sure that handrails are as long as the stairways.  Check any carpeting to make sure that it is firmly attached to the stairs. Fix any carpet that is loose or worn.  Avoid having throw rugs at the top or bottom of the stairs. If you do have throw rugs, attach them to the floor with carpet tape.  Make sure that you have a light switch at the top of the stairs and the bottom of the stairs. If you do not have them, ask someone to add them for you. What else can I do to help prevent falls?  Wear shoes that:  Do not have high heels.  Have rubber bottoms.  Are comfortable and fit you well.  Are closed at the toe. Do not wear sandals.  If you use a stepladder:  Make sure that it is fully opened. Do not climb a closed stepladder.  Make sure that both sides of the  stepladder are locked into place.  Ask someone to hold it for you, if possible.  Clearly mark and make sure that you can see:  Any grab bars or handrails.  First and last steps.  Where the edge of each step is.  Use tools that help you move around (mobility aids) if they are needed. These include:  Canes.  Walkers.  Scooters.  Crutches.  Turn on the lights when you go into a dark area. Replace any light bulbs as soon as they burn out.  Set up your furniture so you have a clear path. Avoid moving your furniture around.  If any of your floors are uneven, fix them.  If there are any pets around you, be aware of where they are.  Review your medicines with your doctor. Some medicines can make you feel dizzy. This can increase your chance of falling. Ask your doctor what other things that you can do to help prevent falls. This information is not intended to replace advice given to you by your health care provider. Make sure you discuss any questions you have with your health care provider. Document Released: 10/15/2008 Document Revised: 05/27/2015 Document Reviewed: 01/23/2014 Elsevier Interactive Patient Education  2017 [employer].

## 2018-04-29 NOTE — Progress Notes (Signed)
Subjective:   Maria Neal is a 68 y.o. female who presents for Medicare Annual (Subsequent) preventive examination.  This visit is being conducted through telemedicine due to the COVID-19 pandemic. This patient has given me verbal consent via doximity to conduct this visit, patient states they are participating from their home address. Some vital signs may be absent or patient reported.   Patient identification: identified by name, DOB, and current address.    Review of Systems:   Cardiac Risk Factors include: advanced age (>52men, >71 women);hypertension;dyslipidemia     Objective:     Vitals: There were no vitals taken for this visit.  There is no height or weight on file to calculate BMI.  Advanced Directives 04/29/2018 03/15/2018 02/17/2018 02/17/2018 04/23/2017 09/22/2016 08/18/2016  Does Patient Have a Medical Advance Directive? No No No No No No No  Would patient like information on creating a medical advance directive? - No - Patient declined No - Patient declined No - Patient declined Yes (MAU/Ambulatory/Procedural Areas - Information given) - No - Patient declined    Tobacco Social History   Tobacco Use  Smoking Status Former Smoker  . Packs/day: 0.25  . Types: Cigarettes  Smokeless Tobacco Never Used  Tobacco Comment   quit at age 47, whole pack lasted 3 weeks      Counseling given: Not Answered Comment: quit at age 70, whole pack lasted 3 weeks    Clinical Intake:  Pre-visit preparation completed: Yes  Pain : No/denies pain Pain Score: 0-No pain     Nutritional Risks: None Diabetes: No  How often do you need to have someone help you when you read instructions, pamphlets, or other written materials from your doctor or pharmacy?: 1 - Never What is the last grade level you completed in school?: 12th grade  Interpreter Needed?: No  Information entered by :: Trinetta Alemu,LPN   Past Medical History:  Diagnosis Date  . AICD (automatic  cardioverter/defibrillator) present   . Anxiety   . CAD (coronary artery disease)   . Cardiomyopathy ([place])   . CHF (congestive heart failure) ([place])   . COPD (chronic obstructive pulmonary disease) ([place])   . Depression   . Dyspnea   . Headache   . History of kidney stones   . History of shingles   . Hypertension   . Insomnia   . On home oxygen therapy    bedtime and prn  . Restless leg syndrome    Past Surgical History:  Procedure Laterality Date  . CARDIAC CATHETERIZATION  04/2014   Dr. Idelle Leech  . CARDIAC CATHETERIZATION    . CARDIAC DEFIBRILLATOR PLACEMENT    . CHOLECYSTECTOMY N/A 08/23/2016   Procedure: LAPAROSCOPIC CHOLECYSTECTOMY;  Surgeon: Clayburn Pert, MD;  Location: [facility] ORS;  Service: General;  Laterality: N/A;  . HERNIA REPAIR    . LAPAROSCOPIC APPENDECTOMY N/A 02/17/2018   Procedure: APPENDECTOMY LAPAROSCOPIC;  Surgeon: Benjamine Sprague, DO;  Location: [facility] ORS;  Service: General;  Laterality: N/A;   Family History  Problem Relation Age of Onset  . Diabetes Mellitus II Mother   . Hypertension Mother   . Heart attack Father   . Heart disease Brother   . Arthritis Sister   . Depression Daughter   . Depression Son   . Schizophrenia Son   . Arthritis Sister   . Diabetes Brother    Social History   Socioeconomic History  . Marital status: Divorced    Spouse name: Not on file  . Number of  children: Not on file  . Years of education: Not on file  . Highest education level: High school graduate  Occupational History  . Occupation: retired  Scientific laboratory technician  . Financial resource strain: Not hard at all  . Food insecurity:    Worry: Never true    Inability: Never true  . Transportation needs:    Medical: No    Non-medical: No  Tobacco Use  . Smoking status: Former Smoker    Packs/day: 0.25    Types: Cigarettes  . Smokeless tobacco: Never Used  . Tobacco comment: quit at age 62, whole pack lasted 3 weeks   Substance and Sexual Activity  . Alcohol use: No     Alcohol/week: 0.0 standard drinks  . Drug use: No  . Sexual activity: Not Currently    Birth control/protection: None  Lifestyle  . Physical activity:    Days per week: 0 days    Minutes per session: 0 min  . Stress: Not at all  Relationships  . Social connections:    Talks on phone: More than three times a week    Gets together: Twice a week    Attends religious service: More than 4 times per year    Active member of club or organization: No    Attends meetings of clubs or organizations: Never    Relationship status: Divorced  Other Topics Concern  . Not on file  Social History Narrative  . Not on file    Outpatient Encounter Medications as of 04/29/2018  Medication Sig  . albuterol (PROVENTIL HFA;VENTOLIN HFA) 108 (90 Base) MCG/ACT inhaler Inhale 2 puffs into the lungs every 6 (six) hours as needed for wheezing or shortness of breath.  Marland Kitchen aspirin EC 81 MG tablet Take 81 mg by mouth daily.  Marland Kitchen atorvastatin (LIPITOR) 20 MG tablet Take 1 tablet (20 mg total) by mouth daily.  . carvedilol (COREG) 25 MG tablet Take 25 mg by mouth 2 (two) times daily.  Marland Kitchen doxycycline (VIBRA-TABS) 100 MG tablet Take 1 tablet (100 mg total) by mouth 2 (two) times daily.  Marland Kitchen ibuprofen (ADVIL,MOTRIN) 800 MG tablet Take 1 tablet (800 mg total) by mouth every 8 (eight) hours as needed for mild pain or moderate pain.  Marland Kitchen losartan (COZAAR) 50 MG tablet Take 50 mg by mouth daily.  Marland Kitchen nystatin (MYCOSTATIN/NYSTOP) powder APPLY TOPICALLY 4 TIMES DAILY (Patient taking differently: Apply 1 g topically 2 (two) times daily as needed (irritation). )  . nystatin cream (MYCOSTATIN) Apply 1 application topically 2 (two) times daily. (Patient taking differently: Apply 1 application topically 2 (two) times daily as needed for dry skin. )  . OXYGEN Inhale 3 L into the lungs 3 (three) times daily as needed (shortness of breath or coughing).  Marland Kitchen spironolactone (ALDACTONE) 25 MG tablet Take 12.5 mg by mouth daily.   No  facility-administered encounter medications on file as of 04/29/2018.     Activities of Daily Living In your present state of health, do you have any difficulty performing the following activities: 04/29/2018 03/15/2018  Hearing? N N  Vision? N N  Difficulty concentrating or making decisions? N N  Walking or climbing stairs? N N  Dressing or bathing? N N  Doing errands, shopping? N N  Comment daughter transports -  Conservation officer, nature and eating ? N -  Using the Toilet? N -  In the past six months, have you accidently leaked urine? N -  Do you have problems with loss of bowel control?  N -  Managing your Medications? N -  Managing your Finances? N -  Housekeeping or managing your Housekeeping? N -  Some recent data might be hidden    Patient Care Team: Volney American, PA-C as PCP - General (Family Medicine) Isaias Cowman, MD as Consulting Physician (Cardiology) Malachy Mood, MD as Consulting Physician (Obstetrics and Gynecology) Erby Pian, MD as Referring Physician (Specialist)    Assessment:   This is a routine wellness examination for [place].  Exercise Activities and Dietary recommendations Current Exercise Habits: The patient does not participate in regular exercise at present, Exercise limited by: None identified  Goals    . DIET - INCREASE WATER INTAKE     Recommend drinking at least 6-8 glasses of water a day        Fall Risk: Fall Risk  04/29/2018 04/23/2018 05/16/2017 05/02/2017 04/23/2017  Falls in the past year? 0 0 Yes Yes Yes  Number falls in past yr: - - 1 1 1   Injury with Fall? - - Yes Yes Yes  Risk Factor Category  - - High Fall Risk - High Fall Risk  Follow up - Falls evaluation completed Falls evaluation completed Falls evaluation completed Falls prevention discussed;Falls evaluation completed    FALL RISK PREVENTION PERTAINING TO THE HOME:  Any stairs in or around the home? Yes  If so, are there any without handrails? No   Home free  of loose throw rugs in walkways, pet beds, electrical cords, etc? Yes  Adequate lighting in your home to reduce risk of falls? Yes   ASSISTIVE DEVICES UTILIZED TO PREVENT FALLS:  Life alert? No  Use of a cane, walker or w/c? No  Grab bars in the bathroom? No  Shower chair or bench in shower? No  Elevated toilet seat or a handicapped toilet? No   DME ORDERS:  DME order needed?  No   TIMED UP AND GO:  Unable to perform    Depression Screen PHQ 2/9 Scores 04/29/2018 05/02/2017 04/23/2017 04/16/2017  PHQ - 2 Score 0 0 0 0  PHQ- 9 Score - 1 - 0     Cognitive Function     6CIT Screen 04/29/2018 04/23/2017  What Year? 0 points 0 points  What month? 0 points 0 points  What time? 0 points 0 points  Count back from 20 0 points 0 points  Months in reverse 0 points 0 points  Repeat phrase 0 points 0 points  Total Score 0 0    Immunization History  Administered Date(s) Administered  . Influenza, High Dose Seasonal PF 10/13/2015, 09/18/2017  . Influenza,inj,quad, With Preservative 10/01/2014  . Influenza-Unspecified 09/22/2013, 10/07/2014  . Pneumococcal Conjugate-13 10/13/2015  . Pneumococcal Polysaccharide-23 09/14/2010, 05/09/2014  . Td 01/02/2013  . Zoster Recombinat (Shingrix) 02/04/2018    Qualifies for Shingles Vaccine? Yes  Shingrix completed completed   Tdap: up to date  Flu Vaccine: up to date  Pneumococcal Vaccine: up to date   Screening Tests Health Maintenance  Topic Date Due  . Hepatitis C Screening  11/06/2018 (Originally 1950/12/20)  . COLONOSCOPY  04/23/2019 (Originally 09/13/2000)  . MAMMOGRAM  04/29/2019 (Originally 09/13/2000)  . DEXA SCAN  04/29/2019 (Originally 09/14/2015)  . INFLUENZA VACCINE  08/03/2018  . PNA vac Low Risk Adult (2 of 2 - PPSV23) 05/09/2019  . TETANUS/TDAP  01/03/2023    Cancer Screenings:  Colorectal Screening: declined, states insurance company will send her hemoccult test.  Mammogram: Declined   Bone Density: Declined  Lung Cancer Screening: (Low Dose CT Chest recommended if Age 43-80 years, 30 pack-year currently smoking OR have quit w/in 15years.) does not qualify.     Additional Screening:  Hepatitis C Screening: does qualify; will order at future labs   Vision Screening: Recommended annual ophthalmology exams for early detection of glaucoma and other disorders of the eye. Is the patient up to date with their annual eye exam?  Yes  Who is the provider or what is the name of the office in which the pt attends annual eye exams? Dr.Bell   Dental Screening: Recommended annual dental exams for proper oral hygiene  Community Resource Referral:  CRR required this visit?  No       Plan:  I have personally reviewed and addressed the Medicare Annual Wellness questionnaire and have noted the following in the patient's chart:  A. Medical and social history B. Use of alcohol, tobacco or illicit drugs  C. Current medications and supplements D. Functional ability and status E.  Nutritional status F.  Physical activity G. Advance directives H. List of other physicians I.  Hospitalizations, surgeries, and ER visits in previous 12 months J.  [place] such as hearing and vision if needed, cognitive and depression L. Referrals and appointments   In addition, I have reviewed and discussed with patient certain preventive protocols, quality metrics, and best practice recommendations. A written personalized care plan for preventive services as well as general preventive health recommendations was offered to patient via mychart or mail, patient prefers to pick up in office next time she at the office.. Nurse Health Advisor  Signed,    [place], [place], [place]  3/56/7014 Nurse Health Advisor   Nurse Notes: none

## 2018-04-30 ENCOUNTER — Other Ambulatory Visit: Payer: Self-pay | Admitting: Unknown Physician Specialty

## 2018-04-30 NOTE — Telephone Encounter (Signed)
Requested medication (s) are due for refill today:   Yes  Requested medication (s) are on the active medication list:   Yes  Future visit scheduled:   No   Last ordered: 05/02/17  #90  3 refills Not reordered due to abnormal labs   Requested Prescriptions  Pending Prescriptions Disp Refills   atorvastatin (LIPITOR) 20 MG tablet [Pharmacy Med Name: ATORVASTATIN CALCIUM 20 MG TAB] 90 tablet 3    Sig: TAKE 1 TABLET BY MOUTH ONCE DAILY     Cardiovascular:  Antilipid - Statins Failed - 04/30/2018  1:12 PM      Failed - Total Cholesterol in normal range and within 360 days    Cholesterol, Total  Date Value Ref Range Status  04/16/2017 204 (H) 100 - 199 mg/dL Final         Failed - LDL in normal range and within 360 days    LDL Calculated  Date Value Ref Range Status  04/16/2017 127 (H) 0 - 99 mg/dL Final         Failed - HDL in normal range and within 360 days    HDL  Date Value Ref Range Status  04/16/2017 47 >39 mg/dL Final         Failed - Triglycerides in normal range and within 360 days    Triglycerides  Date Value Ref Range Status  04/16/2017 150 (H) 0 - 149 mg/dL Final         Passed - Patient is not pregnant      Passed - Valid encounter within last 12 months    Recent Outpatient Visits          1 week ago Pharyngitis, unspecified etiology   [facility] Volney American, PA-C   3 months ago Varicose veins of left lower extremity with pain   [facility] Volney American, PA-C   5 months ago Acute back pain, unspecified back location, unspecified back pain laterality   [facility] Volney American, [place]   10 months ago Cutaneous candidiasis   [place], [place], [place]   11 months ago Disorder of left rotator cuff   [facility] Kathrine Haddock, NP

## 2018-05-08 ENCOUNTER — Other Ambulatory Visit: Payer: Self-pay

## 2018-05-08 ENCOUNTER — Encounter: Payer: Self-pay | Admitting: Family Medicine

## 2018-05-08 ENCOUNTER — Ambulatory Visit (INDEPENDENT_AMBULATORY_CARE_PROVIDER_SITE_OTHER): Payer: Medicare Other | Admitting: Family Medicine

## 2018-05-08 ENCOUNTER — Telehealth: Payer: Self-pay | Admitting: Family Medicine

## 2018-05-08 VITALS — Ht 64.0 in | Wt 198.0 lb

## 2018-05-08 DIAGNOSIS — J029 Acute pharyngitis, unspecified: Secondary | ICD-10-CM

## 2018-05-08 MED ORDER — NYSTATIN 100000 UNIT/ML MT SUSP: 5.0000 mL | Freq: Four times a day (QID) | OROMUCOSAL | 0 refills | Status: DC

## 2018-05-08 MED ORDER — FLUCONAZOLE 150 MG PO TABS: 150.0000 mg | ORAL_TABLET | Freq: Once | ORAL | 0 refills | Status: AC

## 2018-05-08 MED ORDER — NYSTATIN 100000 UNIT/GM EX CREA: 1.0000 "application " | TOPICAL_CREAM | Freq: Two times a day (BID) | CUTANEOUS | 1 refills | Status: DC

## 2018-05-08 NOTE — Telephone Encounter (Signed)
Copied from [place] [id number]. Topic: Quick Communication - Rx Refill/Question >> May 08, 2018  9:56 AM Maria Neal wrote: Medication: nystatin cream (MYCOSTATIN)  Has the patient contacted their pharmacy? yes (Agent: If no, request that the patient contact the pharmacy for the refill.) (Agent: If yes, when and what did the pharmacy advise?)  Preferred Pharmacy (with phone number or street name): [facility], [place]. [phone] (Phone) [phone] (Fax)  Agent: Please be advised that RX refills may take up to 3 business days. We ask that you follow-up with your pharmacy.

## 2018-05-08 NOTE — Progress Notes (Signed)
Ht 5\' 4"  (1.626 m)   Wt 198 lb (89.8 kg)   SpO2 90%   BMI 33.99 kg/m    Subjective:    Patient ID: Maria Neal, female    DOB: 07-Apr-1950, 68 y.o.   MRN: [id number]  HPI: Maria Neal is a 68 y.o. female  Chief Complaint  Patient presents with  . Sore Throat    Patient states her throat is still very sore.     . This visit was completed via telephone due to the restrictions of the COVID-19 pandemic. All issues as above were discussed and addressed but no physical exam was performed. If it was felt that the patient should be evaluated in the office, they were directed there. The patient verbally consented to this visit. Patient was unable to complete an audio/visual visit due to Technical difficulties,Lack of internet. Due to the catastrophic nature of the COVID-19 pandemic, this visit was done through audio contact only. . Location of the patient: home . Location of the provider: home . Those involved with this call:  . Provider: Merrie Roof, PA-C . CMA: Merilyn Baba, [place] . Front Desk/Registration: Jill Side  . Time spent on call: 15 minutes on the phone discussing health concerns. 5 minutes total spent in review of patient's record and preparation of their chart. I verified patient identity using two factors (patient name and date of birth). Patient consents verbally to being seen via telemedicine visit today.   Presenting today with persistent sore throat for 1-2 months. Has been treated with two rounds of abx for suspected strep pharyngitis with minimal improvement. Still having soreness, now white coating on tongue as well. No sores, fevers, dysphagia, difficulty breathing, cough, congestion. Currently using salt water gargles and OTC throat sprays with mild temporary relief.   Relevant past medical, surgical, family and social history reviewed and updated as indicated. Interim medical history since our last visit reviewed. Allergies and medications reviewed and updated.   Review of Systems  Per HPI unless specifically indicated above     Objective:    Ht 5\' 4"  (1.626 m)   Wt 198 lb (89.8 kg)   SpO2 90%   BMI 33.99 kg/m   Wt Readings from Last 3 Encounters:  05/08/18 198 lb (89.8 kg)  03/15/18 201 lb 14.4 oz (91.6 kg)  03/05/18 200 lb (90.7 kg)    Physical Exam  Unable to perform PE as there were technical difficulties   Results for orders placed or performed during the hospital encounter of 02/17/18  Surgical PCR screen  Result Value Ref Range   MRSA, PCR POSITIVE (A) NEGATIVE   Staphylococcus aureus POSITIVE (A) NEGATIVE  CBC with Differential  Result Value Ref Range   WBC 7.2 4.0 - 10.5 K/uL   RBC 4.91 3.87 - 5.11 MIL/uL   Hemoglobin 13.8 12.0 - 15.0 g/dL   HCT 43.8 36.0 - 46.0 %   MCV 89.2 80.0 - 100.0 fL   MCH 28.1 26.0 - 34.0 pg   MCHC 31.5 30.0 - 36.0 g/dL   RDW 15.0 11.5 - 15.5 %   Platelets 228 150 - 400 K/uL   nRBC 0.0 0.0 - 0.2 %   Neutrophils Relative % 55 %   Neutro Abs 4.0 1.7 - 7.7 K/uL   Lymphocytes Relative 31 %   Lymphs Abs 2.2 0.7 - 4.0 K/uL   Monocytes Relative 8 %   Monocytes Absolute 0.6 0.1 - 1.0 K/uL   Eosinophils Relative 5 %  Eosinophils Absolute 0.3 0.0 - 0.5 K/uL   Basophils Relative 1 %   Basophils Absolute 0.1 0.0 - 0.1 K/uL   Immature Granulocytes 0 %   Abs Immature Granulocytes 0.02 0.00 - 0.07 K/uL  Comprehensive metabolic panel  Result Value Ref Range   Sodium 139 135 - 145 mmol/L   Potassium 4.9 3.5 - 5.1 mmol/L   Chloride 100 98 - 111 mmol/L   CO2 31 22 - 32 mmol/L   Glucose, Bld 128 (H) 70 - 99 mg/dL   BUN 11 8 - 23 mg/dL   Creatinine, Ser 0.81 0.44 - 1.00 mg/dL   Calcium 9.1 8.9 - 10.3 mg/dL   Total Protein 7.7 6.5 - 8.1 g/dL   Albumin 4.5 3.5 - 5.0 g/dL   AST 24 15 - 41 U/L   ALT 16 0 - 44 U/L   Alkaline Phosphatase 73 38 - 126 U/L   Total Bilirubin 1.3 (H) 0.3 - 1.2 mg/dL   GFR calc non Af Amer >60 >60 mL/min   GFR calc Af Amer >60 >60 mL/min   Anion gap 8 5 - 15   Urinalysis, Routine w reflex microscopic  Result Value Ref Range   Color, Urine YELLOW (A) YELLOW   APPearance CLEAR (A) CLEAR   Specific Gravity, Urine 1.013 1.005 - 1.030   pH 7.0 5.0 - 8.0   Glucose, UA NEGATIVE NEGATIVE mg/dL   Hgb urine dipstick MODERATE (A) NEGATIVE   Bilirubin Urine NEGATIVE NEGATIVE   Ketones, ur NEGATIVE NEGATIVE mg/dL   Protein, ur NEGATIVE NEGATIVE mg/dL   Nitrite NEGATIVE NEGATIVE   Leukocytes,Ua NEGATIVE NEGATIVE   RBC / HPF >50 (H) 0 - 5 RBC/hpf   WBC, UA 0-5 0 - 5 WBC/hpf   Bacteria, UA NONE SEEN NONE SEEN   Squamous Epithelial / LPF 0-5 0 - 5  Lipase, blood  Result Value Ref Range   Lipase 29 11 - 51 U/L  HIV antibody (Routine Testing)  Result Value Ref Range   HIV Screen 4th Generation wRfx Non Reactive Non Reactive  Basic metabolic panel  Result Value Ref Range   Sodium 138 135 - 145 mmol/L   Potassium 4.4 3.5 - 5.1 mmol/L   Chloride 101 98 - 111 mmol/L   CO2 28 22 - 32 mmol/L   Glucose, Bld 146 (H) 70 - 99 mg/dL   BUN 12 8 - 23 mg/dL   Creatinine, Ser 0.94 0.44 - 1.00 mg/dL   Calcium 8.9 8.9 - 10.3 mg/dL   GFR calc non Af Amer >60 >60 mL/min   GFR calc Af Amer >60 >60 mL/min   Anion gap 9 5 - 15  Magnesium  Result Value Ref Range   Magnesium 1.9 1.7 - 2.4 mg/dL  Phosphorus  Result Value Ref Range   Phosphorus 4.5 2.5 - 4.6 mg/dL  CBC  Result Value Ref Range   WBC 7.0 4.0 - 10.5 K/uL   RBC 4.53 3.87 - 5.11 MIL/uL   Hemoglobin 12.9 12.0 - 15.0 g/dL   HCT 40.7 36.0 - 46.0 %   MCV 89.8 80.0 - 100.0 fL   MCH 28.5 26.0 - 34.0 pg   MCHC 31.7 30.0 - 36.0 g/dL   RDW 14.9 11.5 - 15.5 %   Platelets 172 150 - 400 K/uL   nRBC 0.0 0.0 - 0.2 %  Surgical pathology  Result Value Ref Range   SURGICAL PATHOLOGY      Surgical Pathology CASE: [id number] PATIENT: Maria Neal Surgical Pathology  Report     SPECIMEN SUBMITTED: A. Endometrium, bx B. Appendix  CLINICAL HISTORY: None provided  PRE-OPERATIVE DIAGNOSIS: Acute  appendicitis, post menopausal bleeding with thickened endometrium  POST-OPERATIVE DIAGNOSIS: Same as pre op     DIAGNOSIS: A. ENDOMETRIUM; BIOPSY: - FRAGMENTS OF INFARCTED ENDOMETRIAL POLYP. - SUPERFICIAL STRIPS OF INACTIVE ENDOMETRIAL TISSUE. - NEGATIVE FOR ATYPICAL HYPERPLASIA/EIN AND MALIGNANCY.  B. APPENDIX; APPENDECTOMY: - ACUTE APPENDICITIS WITH MILD SEROSITIS AND FIBRINOUS ADHESIONS. - NEGATIVE FOR MALIGNANCY.   GROSS DESCRIPTION: A. Labeled: Endometrial biopsy Received: Formalin Tissue fragment(s): Multiple Size: Aggregate, 4.0 x 2.5 x 0.3 cm Description: Mucinous/hemorrhagic material admixed with possible soft tissue.  Filtered into mesh bags. Entirely submitted in cassettes 1-2.  B. Labeled: Appendix Received: Formalin Size: 4.0  cm in length by 0.8 cm in diameter with attached mesoappendix up to 1.2 cm in thickness External surface: Tan-pink and smooth with multifocal areas of hemorrhage Perforation: No site of perforation is grossly identified. Fecalith: Absent Description: The proximal margin is inked blue.  Sectioning displays a patent, pinpoint lumen containing minimal, hemorrhagic material.  The mucosa is tan-pink with multifocal areas of hemorrhage.  Block summary: 1 - appendiceal resection margin (perpendicular), tip (representative), and cross-sections   Final Diagnosis performed by Allena Napoleon, MD.   Electronically signed 02/19/2018 12:54:36PM The electronic signature indicates that the named Attending Pathologist has evaluated the specimen  Technical component performed at [facility], [address] [place], [place] [address] Lab: [phone] Dir: Rush Farmer, MD, MMM  Professional component performed at [facility], [facility], [place], [place], [place] Lab: [phone] Dir: Dellia Nims. Reuel Derby, MD       Assessment & Plan:   Problem List Items Addressed This Visit    None    Visit Diagnoses    Sore  throat    -  Primary   Suspect thrush causing sxs. Tx with one tab oral diflucan and nystatin mouthwash. Probiotics recommended. F/u if not improving for in person eval       Follow up plan: Return for as scheduled.

## 2018-05-08 NOTE — Telephone Encounter (Signed)
Discussed this refill and sent during OV this morning

## 2018-05-20 ENCOUNTER — Telehealth: Payer: Self-pay | Admitting: Obstetrics and Gynecology

## 2018-05-20 NOTE — Telephone Encounter (Signed)
Spoke to patient to reschedule surgery. Patient is aware we can reschedule elective surgeries in June, and that Dr. Georgianne Fick said she was fine to wait until August/September if she wanted. Patient decided on 06/27/18. Patient is aware of H&P at [facility] on 06/18/18 @ 8:50am w/ Dr. Georgianne Fick, Pre-admit Testing and COVID testing to be scheduled, and OR on 06/27/18. Patient is aware she may be asked to quarantine after COVID testing. Patient is aware Dr. Georgianne Fick requests cardiology clearance, and has a televisit w/ Dr. Saralyn Pilar this month.

## 2018-06-05 ENCOUNTER — Ambulatory Visit (INDEPENDENT_AMBULATORY_CARE_PROVIDER_SITE_OTHER): Payer: Medicare Other | Admitting: Family Medicine

## 2018-06-05 ENCOUNTER — Encounter: Payer: Self-pay | Admitting: Family Medicine

## 2018-06-05 ENCOUNTER — Other Ambulatory Visit: Payer: Self-pay

## 2018-06-05 VITALS — BP 110/72 | HR 69 | Temp 98.1°F | Ht 64.0 in | Wt 196.0 lb

## 2018-06-05 DIAGNOSIS — R1013 Epigastric pain: Secondary | ICD-10-CM

## 2018-06-05 DIAGNOSIS — J029 Acute pharyngitis, unspecified: Secondary | ICD-10-CM

## 2018-06-05 MED ORDER — NYSTATIN 100000 UNIT/ML MT SUSP: 5.0000 mL | Freq: Four times a day (QID) | OROMUCOSAL | 0 refills | Status: DC

## 2018-06-05 MED ORDER — PANTOPRAZOLE SODIUM 40 MG PO TBEC: 40.0000 mg | DELAYED_RELEASE_TABLET | Freq: Every day | ORAL | 3 refills | Status: DC

## 2018-06-05 NOTE — Progress Notes (Signed)
BP 110/72   Pulse 69   Temp 98.1 F (36.7 C) (Oral)   Ht 5\' 4"  (1.626 m)   Wt 196 lb (88.9 kg)   SpO2 93%   BMI 33.64 kg/m    Subjective:    Patient ID: Maria Neal, female    DOB: Aug 15, 1950, 68 y.o.   MRN: [id number]  HPI: Maria Neal is a 68 y.o. female  Chief Complaint  Patient presents with  . Abdominal Pain    on and off for about 3-4 weeks. tried OTC peptobismol  . Sore Throat    pt states still red behing throat   Patient here today with persistent sore throat, feels like the left side is swollen a bit at times. Was treated about 2 months ago with abx for suspected strep pharyngitis without much improvement, then last month with nystatin mouthwash and diflucan which she states did help for a bit but sxs returned. Denies fevers, chills, dysphagia, white coating on tongue. Does note some reflux sxs bothering her lately though, of note.   Has had some intermittent upper abdominal pain, seems to come on with eating greasy foods. Worst episode was yesterday after eating a fish sandwhich from a fast food restaurant, pain in epigastric area and bloating that lasted about 3-4 hours and then subsided. Pepto bismol seems to help. Denies vomiting, diarrhea, fevers, melena. Hx of cholecystectomy.   Relevant past medical, surgical, family and social history reviewed and updated as indicated. Interim medical history since our last visit reviewed. Allergies and medications reviewed and updated.  Review of Systems  Per HPI unless specifically indicated above     Objective:    BP 110/72   Pulse 69   Temp 98.1 F (36.7 C) (Oral)   Ht 5\' 4"  (1.626 m)   Wt 196 lb (88.9 kg)   SpO2 93%   BMI 33.64 kg/m   Wt Readings from Last 3 Encounters:  06/05/18 196 lb (88.9 kg)  05/08/18 198 lb (89.8 kg)  03/15/18 201 lb 14.4 oz (91.6 kg)    Physical Exam Vitals signs and nursing note reviewed.  Constitutional:      Appearance: Normal appearance. She is not ill-appearing.  HENT:    Head: Atraumatic.     Mouth/Throat:     Mouth: Mucous membranes are moist.     Pharynx: Posterior oropharyngeal erythema (mildly erythematous diffusely) present.     Comments: No white coating or exudates noted, tonsils appear benign Eyes:     Extraocular Movements: Extraocular movements intact.     Conjunctiva/sclera: Conjunctivae normal.  Neck:     Musculoskeletal: Normal range of motion and neck supple.  Cardiovascular:     Rate and Rhythm: Normal rate and regular rhythm.     Heart sounds: Normal heart sounds.  Pulmonary:     Effort: Pulmonary effort is normal.     Breath sounds: Normal breath sounds.  Abdominal:     General: Bowel sounds are normal.     Palpations: Abdomen is soft.     Tenderness: There is no abdominal tenderness. There is no right CVA tenderness, left CVA tenderness or guarding.  Musculoskeletal: Normal range of motion.  Lymphadenopathy:     Cervical: No cervical adenopathy.  Skin:    General: Skin is warm and dry.  Neurological:     Mental Status: She is alert and oriented to person, place, and time.  Psychiatric:        Mood and Affect: Mood normal.  Thought Content: Thought content normal.        Judgment: Judgment normal.     Results for orders placed or performed in visit on 06/05/18  Rapid Strep Screen ([facility] ONLY)  Result Value Ref Range   Strep Gp A Ag, IA W/Reflex Negative Negative  Culture, Group A Strep  Result Value Ref Range   Strep A Culture Negative       Assessment & Plan:   Problem List Items Addressed This Visit    None    Visit Diagnoses    Sore throat    -  Primary   Restart nystatin, start protonix in case GERD related. F/u if sxs worsening or not improving. Rapid strep test neg.    Relevant Orders   Rapid Strep Screen ([facility] ONLY) (Completed)   Epigastric pain       Suspect GERD/gastritis. Avoid trigger foods, start protonix. F/u if worsening or not improving       Follow up plan: Return  for as scheduled.

## 2018-06-08 LAB — CULTURE, GROUP A STREP: Strep A Culture: NEGATIVE

## 2018-06-08 LAB — RAPID STREP SCREEN (MED CTR MEBANE ONLY): Strep Gp A Ag, IA W/Reflex: NEGATIVE

## 2018-06-18 ENCOUNTER — Other Ambulatory Visit: Payer: Self-pay

## 2018-06-18 ENCOUNTER — Encounter: Payer: Self-pay | Admitting: Obstetrics and Gynecology

## 2018-06-18 ENCOUNTER — Ambulatory Visit (INDEPENDENT_AMBULATORY_CARE_PROVIDER_SITE_OTHER): Payer: Medicare Other | Admitting: Obstetrics and Gynecology

## 2018-06-18 VITALS — BP 116/72 | HR 80 | Ht 64.0 in | Wt 199.0 lb

## 2018-06-18 DIAGNOSIS — Z01818 Encounter for other preprocedural examination: Secondary | ICD-10-CM | POA: Diagnosis not present

## 2018-06-18 DIAGNOSIS — N840 Polyp of corpus uteri: Secondary | ICD-10-CM

## 2018-06-18 NOTE — Progress Notes (Signed)
Obstetrics & Gynecology Surgery H&P    Chief Complaint: Scheduled Surgery   History of Present Illness: Patient is a 68 y.o. No obstetric history on file. presenting for scheduled hysteroscopy D&C, for the treatment or further evaluation of PMB.   Prior Treatments prior to proceeding with surgery include: prior ultrasound and biopsy   Preoperative Pap: N/A >65 Preoperative Endometrial biopsy: 02/17/2018  Findings: benign polyp Preoperative Ultrasound: 03/05/2018 Findings: thickened endometrial lining   Review of Systems:10 point review of systems  Past Medical History:  Past Medical History:  Diagnosis Date   AICD (automatic cardioverter/defibrillator) present    Anxiety    CAD (coronary artery disease)    Cardiomyopathy ([place])    CHF (congestive heart failure) ([facility])    COPD (chronic obstructive pulmonary disease) (H[facility])    Depression    Dyspnea    Headache    History of kidney stones    History of shingles    Hypertension    Insomnia    On home oxygen therapy    bedtime and prn   Restless leg syndrome     Past Surgical History:  Past Surgical History:  Procedure Laterality Date   CARDIAC CATHETERIZATION  04/2014   Dr. Idelle Leech   CARDIAC CATHETERIZATION     CARDIAC DEFIBRILLATOR PLACEMENT     CHOLECYSTECTOMY N/A 08/23/2016   Procedure: LAPAROSCOPIC CHOLECYSTECTOMY;  Surgeon: Clayburn Pert, MD;  Location: [facility] ORS;  Service: General;  Laterality: N/A;   HERNIA REPAIR     LAPAROSCOPIC APPENDECTOMY N/A 02/17/2018   Procedure: APPENDECTOMY LAPAROSCOPIC;  Surgeon: Benjamine Sprague, DO;  Location: [facility] ORS;  Service: General;  Laterality: N/A;    Family History:  Family History  Problem Relation Age of Onset   Diabetes Mellitus II Mother    Hypertension Mother    Heart attack Father    Heart disease Brother    Arthritis Sister    Depression Daughter    Depression Son    Schizophrenia Son    Arthritis Sister    Diabetes Brother      Social History:  Social History   Socioeconomic History   Marital status: Divorced    Spouse name: Not on file   Number of children: Not on file   Years of education: Not on file   Highest education level: High school graduate  Occupational History   Occupation: retired  Scientist, product/process development strain: Not hard at all   Food insecurity    Worry: Never true    Inability: Never true   Transportation needs    Medical: No    Non-medical: No  Tobacco Use   Smoking status: Former Smoker    Packs/day: 0.25    Types: Cigarettes   Smokeless tobacco: Never Used   Tobacco comment: quit at age 43, whole pack lasted 3 weeks   Substance and Sexual Activity   Alcohol use: No    Alcohol/week: 0.0 standard drinks   Drug use: No   Sexual activity: Not Currently    Birth control/protection: None  Lifestyle   Physical activity    Days per week: 0 days    Minutes per session: 0 min   Stress: Not at all  Relationships   Social connections    Talks on phone: More than three times a week    Gets together: Twice a week    Attends religious service: More than 4 times per year    Active member of club or organization: No  Attends meetings of clubs or organizations: Never    Relationship status: Divorced   Intimate partner violence    Fear of current or ex partner: No    Emotionally abused: No    Physically abused: No    Forced sexual activity: No  Other Topics Concern   Not on file  Social History Narrative   Not on file    Allergies:  Allergies  Allergen Reactions   Levaquin [Levofloxacin] Anaphylaxis   Amoxicillin Hives    Did it involve swelling of the face/tongue/throat, SOB, or low BP? No Did it involve sudden or severe rash/hives, skin peeling, or any reaction on the inside of your mouth or nose? No Did you need to seek medical attention at a hospital or doctor's office? No When did it last happen?10+ years If all above answers  are "NO", may proceed with cephalosporin use.   Nitrofuran Derivatives Other (See Comments)    Unknown   Penicillins Other (See Comments)    Thinks it made her itch a lot, but isn't sure. Did it involve swelling of the face/tongue/throat, SOB, or low BP? No Did it involve sudden or severe rash/hives, skin peeling, or any reaction on the inside of your mouth or nose? No Did you need to seek medical attention at a hospital or doctor's office? No When did it last happen?10+ years If all above answers are "NO", may proceed with cephalosporin use.    Zithromax [Azithromycin] Other (See Comments)   Antihistamines, Chlorpheniramine-Type Rash    Medications: Prior to Admission medications   Medication Sig Start Date End Date Taking? Authorizing Provider  albuterol (PROVENTIL HFA;VENTOLIN HFA) 108 (90 Base) MCG/ACT inhaler Inhale 2 puffs into the lungs every 6 (six) hours as needed for wheezing or shortness of breath.   Yes [provider]  aspirin EC 81 MG tablet Take 81 mg by mouth daily.   Yes [provider]  atorvastatin (LIPITOR) 20 MG tablet TAKE 1 TABLET BY MOUTH ONCE DAILY 05/01/18  Yes Volney American, PA-C  carvedilol (COREG) 25 MG tablet Take 25 mg by mouth 2 (two) times daily. 04/12/17  Yes [provider]  ibuprofen (ADVIL,MOTRIN) 800 MG tablet Take 1 tablet (800 mg total) by mouth every 8 (eight) hours as needed for mild pain or moderate pain. 02/18/18  Yes Sakai, Isami, DO  losartan (COZAAR) 50 MG tablet Take 50 mg by mouth daily. 04/10/17  Yes [provider]  OXYGEN Inhale 3 L into the lungs 3 (three) times daily as needed (shortness of breath or coughing).   Yes [provider]  pantoprazole (PROTONIX) 40 MG tablet Take 1 tablet (40 mg total) by mouth daily. 06/05/18  Yes Volney American, PA-C  spironolactone (ALDACTONE) 25 MG tablet Take 12.5 mg by mouth daily. 04/09/17  Yes [provider]  nystatin  (MYCOSTATIN) 100000 UNIT/ML suspension Take 5 mLs (500,000 Units total) by mouth 4 (four) times daily. Patient not taking: Reported on 06/18/2018 06/05/18   Volney American, PA-C  nystatin cream (MYCOSTATIN) Apply 1 application topically 2 (two) times daily. Patient not taking: Reported on 06/18/2018 05/08/18   Volney American, PA-C    Physical Exam Vitals: Blood pressure 116/72, pulse 80, height 5\' 4"  (1.626 m), weight 199 lb (90.3 kg). General: NAD HEENT: normocephalic, anicteric Pulmonary: No increased work of breathing Cardiovascular: RRR, distal pulses 2 Extremities: no edema, erythema, or tenderness Neurologic: Grossly intact Psychiatric: mood appropriate, affect full  Imaging No results found.  Assessment:  68 y.o. No obstetric history on file. presenting for scheduled hysteroscopy, D&C  Plan: 1) I have discussed with the patient the indications for the procedure. Included in the discussion were the options of therapy, as wall as their individual risks, benefits, and complications. Ample time was given to answer all questions.   In office pipelle biopsy generally provides comparable results to [facility], however this sampling modality may miss focal lesions if these were previously documented on ultrasound.  It is because of the potential to miss focal lesions that hysteroscopy D&C is also warranted in patient with continued postmenopausal bleeding that is not self limited regardless of prior in office biopsy results or ultrasound findings.  She understands that the risk of continued observation include worsening bleeding or worsening of any underlying pathology.  The choices include: 1. Doing nothing but following her symptoms 2. Attempts at hormonal manipulation with either BCP or Depo-Provera for premenopausal patients with no concern for focal lesion or endometrial pathology 3. D&C/hysteroscopy. 4. Endometrial ablation via Novasure or other techniques for premenopausal patients with  no concern for focal lesion or endometrial pathology  5. As final resort, hysterectomy. After consideration of her history and findings, mutual decision has been made to proceed with D+C/hysteroscopy. While the incidence is low, the risks from this surgery include, but are not limited to, the risks of anesthesia, hemorrhage, infection, perforation, and injury to adjacent structures including bowel, bladder and blood vessels.    2) Routine postoperative instructions were reviewed with the patient and her family in detail today including the expected length of recovery and likely postoperative course.  The patient concurred with the proposed plan, giving informed written consent for the surgery today.  Patient instructed on the importance of being NPO after midnight prior to her procedure.  If warranted preoperative prophylactic antibiotics and SCDs ordered on call to the OR to meet SCIP guidelines and adhere to recommendation laid forth in [place] Number 104 May 2009  "Antibiotic Prophylaxis for Gynecologic Procedures".     Malachy Mood, MD, [facility], [facility] 06/18/2018, 9:23 AM

## 2018-06-20 ENCOUNTER — Encounter: Payer: Self-pay | Admitting: Family Medicine

## 2018-06-20 ENCOUNTER — Other Ambulatory Visit: Payer: Self-pay

## 2018-06-20 ENCOUNTER — Ambulatory Visit (INDEPENDENT_AMBULATORY_CARE_PROVIDER_SITE_OTHER): Payer: Medicare Other | Admitting: Family Medicine

## 2018-06-20 VITALS — BP 116/69 | HR 73 | Temp 98.4°F | Ht 64.0 in | Wt 199.0 lb

## 2018-06-20 DIAGNOSIS — H9202 Otalgia, left ear: Secondary | ICD-10-CM | POA: Diagnosis not present

## 2018-06-20 NOTE — Progress Notes (Signed)
BP 116/69   Pulse 73   Temp 98.4 F (36.9 C) (Oral)   Ht 5\' 4"  (1.626 m)   Wt 199 lb (90.3 kg)   SpO2 97%   BMI 34.16 kg/m    Subjective:    Patient ID: Maria Neal, female    DOB: 11-Jan-1950, 68 y.o.   MRN: [id number]  HPI: Maria Neal is a 68 y.o. female  Chief Complaint  Patient presents with  . Ear Pain    Left. x 3 days. Has scheduled surgery next week.    Here today with left ear pain x 3 days. Feels like there's a bump on the outside of left ear. Pain radiates down to jaw at times, especially with chewing. Denies muffled hearing, ear pressure, drainage, fevers, congestion or other recent illness. Has not been trying anything OTC for sxs.   Relevant past medical, surgical, family and social history reviewed and updated as indicated. Interim medical history since our last visit reviewed. Allergies and medications reviewed and updated.  Review of Systems  Per HPI unless specifically indicated above     Objective:    BP 116/69   Pulse 73   Temp 98.4 F (36.9 C) (Oral)   Ht 5\' 4"  (1.626 m)   Wt 199 lb (90.3 kg)   SpO2 97%   BMI 34.16 kg/m   Wt Readings from Last 3 Encounters:  06/24/18 198 lb (89.8 kg)  06/20/18 199 lb (90.3 kg)  06/18/18 199 lb (90.3 kg)    Physical Exam Vitals signs and nursing note reviewed.  Constitutional:      Appearance: Normal appearance. She is not ill-appearing.  HENT:     Head: Atraumatic.     Right Ear: Tympanic membrane normal.     Left Ear: Tympanic membrane normal.     Ears:     Comments: B/l outer ears and TMs benign, no erythema, papules or pustules.     Nose: Nose normal.     Mouth/Throat:     Mouth: Mucous membranes are moist.     Pharynx: Oropharynx is clear.     Comments: No obvious dental infection, with particular attention to left lower jaw Eyes:     Extraocular Movements: Extraocular movements intact.     Conjunctiva/sclera: Conjunctivae normal.  Neck:     Musculoskeletal: Normal range of motion and  neck supple.  Cardiovascular:     Rate and Rhythm: Normal rate and regular rhythm.     Heart sounds: Normal heart sounds.  Pulmonary:     Effort: Pulmonary effort is normal.     Breath sounds: Normal breath sounds.  Musculoskeletal: Normal range of motion.        General: No tenderness (left TMJ nontender to papation and without crepitus).  Skin:    General: Skin is warm and dry.  Neurological:     Mental Status: She is alert and oriented to person, place, and time.  Psychiatric:        Mood and Affect: Mood normal.        Thought Content: Thought content normal.        Judgment: Judgment normal.     Results for orders placed or performed in visit on 06/05/18  Rapid Strep Screen ([facility] ONLY)   Specimen: Throat; Other   OTHER  Result Value Ref Range   Strep Gp A Ag, IA W/Reflex Negative Negative  Culture, Group A Strep   OTHER  Result Value Ref Range  Strep A Culture Negative       Assessment & Plan:   Problem List Items Addressed This Visit    None    Visit Diagnoses    Left ear pain    -  Primary   No obvious cause of the pain. Warm compresses, OTC pain relievers. F/u if worsening or not improving       Follow up plan: Return if symptoms worsen or fail to improve.

## 2018-06-24 ENCOUNTER — Encounter
Admission: RE | Admit: 2018-06-24 | Discharge: 2018-06-24 | Disposition: A | Discharge status: Home / Self Care | Payer: Medicare Other | Source: Ambulatory Visit | Attending: Obstetrics and Gynecology | Admitting: Obstetrics and Gynecology

## 2018-06-24 ENCOUNTER — Other Ambulatory Visit: Payer: Self-pay

## 2018-06-24 DIAGNOSIS — N840 Polyp of corpus uteri: Secondary | ICD-10-CM | POA: Diagnosis not present

## 2018-06-24 DIAGNOSIS — J449 Chronic obstructive pulmonary disease, unspecified: Secondary | ICD-10-CM | POA: Diagnosis not present

## 2018-06-24 DIAGNOSIS — Z1159 Encounter for screening for other viral diseases: Secondary | ICD-10-CM | POA: Diagnosis not present

## 2018-06-24 DIAGNOSIS — Z7982 Long term (current) use of aspirin: Secondary | ICD-10-CM | POA: Diagnosis not present

## 2018-06-24 DIAGNOSIS — Z87891 Personal history of nicotine dependence: Secondary | ICD-10-CM | POA: Diagnosis not present

## 2018-06-24 DIAGNOSIS — Z95810 Presence of automatic (implantable) cardiac defibrillator: Secondary | ICD-10-CM | POA: Diagnosis not present

## 2018-06-24 DIAGNOSIS — I2510 Atherosclerotic heart disease of native coronary artery without angina pectoris: Secondary | ICD-10-CM | POA: Diagnosis not present

## 2018-06-24 DIAGNOSIS — Z01812 Encounter for preprocedural laboratory examination: Secondary | ICD-10-CM | POA: Insufficient documentation

## 2018-06-24 DIAGNOSIS — Z79899 Other long term (current) drug therapy: Secondary | ICD-10-CM | POA: Diagnosis not present

## 2018-06-24 DIAGNOSIS — I110 Hypertensive heart disease with heart failure: Secondary | ICD-10-CM | POA: Diagnosis not present

## 2018-06-24 DIAGNOSIS — Z9981 Dependence on supplemental oxygen: Secondary | ICD-10-CM | POA: Diagnosis not present

## 2018-06-24 DIAGNOSIS — I509 Heart failure, unspecified: Secondary | ICD-10-CM | POA: Diagnosis not present

## 2018-06-24 DIAGNOSIS — N950 Postmenopausal bleeding: Secondary | ICD-10-CM | POA: Diagnosis present

## 2018-06-24 LAB — TYPE AND SCREEN
ABO/RH(D): A POS
Antibody Screen: NEGATIVE

## 2018-06-24 NOTE — Patient Instructions (Signed)
REVIEWED PREVIOUS PAT INSTRUCTIONS. UPDATED ON PATIENT COPY AND PROVIDED A COPY. PATIENT VERBALIZED UNDERSTANDING.

## 2018-06-25 LAB — NOVEL CORONAVIRUS, NAA (HOSP ORDER, SEND-OUT TO REF LAB; TAT 18-24 HRS): SARS-CoV-2, NAA: NOT DETECTED

## 2018-06-27 ENCOUNTER — Encounter
Admission: RE | Disposition: A | Discharge status: Home / Self Care | Payer: Self-pay | Source: Home / Self Care | Attending: Obstetrics and Gynecology

## 2018-06-27 ENCOUNTER — Ambulatory Visit: Payer: Medicare Other | Admitting: Anesthesiology

## 2018-06-27 ENCOUNTER — Ambulatory Visit
Admission: RE | Admit: 2018-06-27 | Discharge: 2018-06-27 | Disposition: A | Discharge status: Home / Self Care | Payer: Medicare Other | Attending: Obstetrics and Gynecology | Admitting: Obstetrics and Gynecology

## 2018-06-27 ENCOUNTER — Other Ambulatory Visit: Payer: Self-pay

## 2018-06-27 DIAGNOSIS — Z1159 Encounter for screening for other viral diseases: Secondary | ICD-10-CM | POA: Insufficient documentation

## 2018-06-27 DIAGNOSIS — I2510 Atherosclerotic heart disease of native coronary artery without angina pectoris: Secondary | ICD-10-CM | POA: Insufficient documentation

## 2018-06-27 DIAGNOSIS — Z95810 Presence of automatic (implantable) cardiac defibrillator: Secondary | ICD-10-CM | POA: Insufficient documentation

## 2018-06-27 DIAGNOSIS — N840 Polyp of corpus uteri: Secondary | ICD-10-CM | POA: Insufficient documentation

## 2018-06-27 DIAGNOSIS — I110 Hypertensive heart disease with heart failure: Secondary | ICD-10-CM | POA: Insufficient documentation

## 2018-06-27 DIAGNOSIS — N950 Postmenopausal bleeding: Secondary | ICD-10-CM | POA: Diagnosis not present

## 2018-06-27 DIAGNOSIS — R9389 Abnormal findings on diagnostic imaging of other specified body structures: Secondary | ICD-10-CM

## 2018-06-27 DIAGNOSIS — Z7982 Long term (current) use of aspirin: Secondary | ICD-10-CM | POA: Insufficient documentation

## 2018-06-27 DIAGNOSIS — I509 Heart failure, unspecified: Secondary | ICD-10-CM | POA: Insufficient documentation

## 2018-06-27 DIAGNOSIS — J449 Chronic obstructive pulmonary disease, unspecified: Secondary | ICD-10-CM | POA: Insufficient documentation

## 2018-06-27 DIAGNOSIS — Z87891 Personal history of nicotine dependence: Secondary | ICD-10-CM | POA: Insufficient documentation

## 2018-06-27 DIAGNOSIS — Z9981 Dependence on supplemental oxygen: Secondary | ICD-10-CM | POA: Insufficient documentation

## 2018-06-27 DIAGNOSIS — Z79899 Other long term (current) drug therapy: Secondary | ICD-10-CM | POA: Insufficient documentation

## 2018-06-27 DIAGNOSIS — Z9889 Other specified postprocedural states: Secondary | ICD-10-CM

## 2018-06-27 HISTORY — PX: DILATATION & CURETTAGE/HYSTEROSCOPY WITH MYOSURE: SHX6511

## 2018-06-27 LAB — ABO/RH: ABO/RH(D): A POS

## 2018-06-27 SURGERY — DILATATION & CURETTAGE/HYSTEROSCOPY WITH MYOSURE
Anesthesia: General

## 2018-06-27 MED ORDER — LIDOCAINE HCL (CARDIAC) PF 100 MG/5ML IV SOSY
PREFILLED_SYRINGE | INTRAVENOUS | Status: DC | PRN
  Administered 2018-06-27: 30 mg via INTRAVENOUS

## 2018-06-27 MED ORDER — PROPOFOL 10 MG/ML IV BOLUS
INTRAVENOUS | Status: DC | PRN
  Administered 2018-06-27: 150 mg via INTRAVENOUS

## 2018-06-27 MED ORDER — PHENYLEPHRINE HCL (PRESSORS) 10 MG/ML IV SOLN
INTRAVENOUS | Status: DC | PRN
  Administered 2018-06-27 (×3): 100 ug via INTRAVENOUS

## 2018-06-27 MED ORDER — SUGAMMADEX SODIUM 200 MG/2ML IV SOLN
INTRAVENOUS | Status: AC
  Filled 2018-06-27: qty 2

## 2018-06-27 MED ORDER — FENTANYL CITRATE (PF) 100 MCG/2ML IJ SOLN
INTRAMUSCULAR | Status: DC | PRN
  Administered 2018-06-27 (×2): 50 ug via INTRAVENOUS

## 2018-06-27 MED ORDER — IBUPROFEN 800 MG PO TABS: 800.0000 mg | ORAL_TABLET | Freq: Three times a day (TID) | ORAL | 0 refills | Status: DC | PRN

## 2018-06-27 MED ORDER — FENTANYL CITRATE (PF) 100 MCG/2ML IJ SOLN
INTRAMUSCULAR | Status: AC
  Filled 2018-06-27: qty 2

## 2018-06-27 MED ORDER — LACTATED RINGERS IV SOLN
INTRAVENOUS | Status: DC
  Administered 2018-06-27 (×2): via INTRAVENOUS

## 2018-06-27 MED ORDER — ROCURONIUM BROMIDE 100 MG/10ML IV SOLN
INTRAVENOUS | Status: DC | PRN
  Administered 2018-06-27: 30 mg via INTRAVENOUS

## 2018-06-27 MED ORDER — PROPOFOL 10 MG/ML IV BOLUS
INTRAVENOUS | Status: AC
  Filled 2018-06-27: qty 20

## 2018-06-27 MED ORDER — MIDAZOLAM HCL 2 MG/2ML IJ SOLN
INTRAMUSCULAR | Status: AC
  Filled 2018-06-27: qty 2

## 2018-06-27 MED ORDER — ONDANSETRON HCL 4 MG/2ML IJ SOLN
INTRAMUSCULAR | Status: DC | PRN
  Administered 2018-06-27: 4 mg via INTRAVENOUS

## 2018-06-27 MED ORDER — OXYCODONE HCL 5 MG PO TABS: 5.0000 mg | ORAL_TABLET | Freq: Once | ORAL | Status: DC | PRN

## 2018-06-27 MED ORDER — DEXAMETHASONE SODIUM PHOSPHATE 10 MG/ML IJ SOLN
INTRAMUSCULAR | Status: DC | PRN
  Administered 2018-06-27: 10 mg via INTRAVENOUS

## 2018-06-27 MED ORDER — FENTANYL CITRATE (PF) 100 MCG/2ML IJ SOLN: 25.0000 ug | INTRAMUSCULAR | Status: DC | PRN

## 2018-06-27 MED ORDER — OXYCODONE HCL 5 MG/5ML PO SOLN: 5.0000 mg | Freq: Once | ORAL | Status: DC | PRN

## 2018-06-27 MED ORDER — SUGAMMADEX SODIUM 200 MG/2ML IV SOLN
INTRAVENOUS | Status: DC | PRN
  Administered 2018-06-27: 200 mg via INTRAVENOUS

## 2018-06-27 MED ORDER — HYDROCODONE-ACETAMINOPHEN 5-325 MG PO TABS: 1.0000 | ORAL_TABLET | Freq: Four times a day (QID) | ORAL | 0 refills | Status: DC | PRN

## 2018-06-27 MED ORDER — MIDAZOLAM HCL 2 MG/2ML IJ SOLN
INTRAMUSCULAR | Status: DC | PRN
  Administered 2018-06-27: 1 mg via INTRAVENOUS

## 2018-06-27 SURGICAL SUPPLY — 20 items
CANISTER SUC SOCK COL 7IN (MISCELLANEOUS) ×1 IMPLANT
CATH ROBINSON RED A/P 16FR (CATHETERS) ×2 IMPLANT
COVER WAND RF STERILE (DRAPES) ×2 IMPLANT
DEVICE MYOSURE LITE (MISCELLANEOUS) ×1 IMPLANT
DEVICE MYOSURE REACH (MISCELLANEOUS) IMPLANT
ELECT REM PT RETURN 9FT ADLT (ELECTROSURGICAL) ×2
ELECTRODE REM PT RTRN 9FT ADLT (ELECTROSURGICAL) ×1 IMPLANT
GLOVE BIO SURGEON STRL SZ7 (GLOVE) ×2 IMPLANT
GOWN STRL REUS W/ TWL LRG LVL3 (GOWN DISPOSABLE) ×2 IMPLANT
GOWN STRL REUS W/TWL LRG LVL3 (GOWN DISPOSABLE) ×2
KIT PROCEDURE FLUENT (KITS) ×1 IMPLANT
KIT TURNOVER CYSTO (KITS) ×2 IMPLANT
PACK DNC HYST (MISCELLANEOUS) ×2 IMPLANT
PAD OB MATERNITY 4.3X12.25 (PERSONAL CARE ITEMS) ×2 IMPLANT
PAD PREP 24X41 OB/GYN DISP (PERSONAL CARE ITEMS) ×2 IMPLANT
SEAL ROD LENS SCOPE MYOSURE (ABLATOR) ×2 IMPLANT
SOL .9 NS 3000ML IRR  AL (IV SOLUTION) ×2
SOL .9 NS 3000ML IRR UROMATIC (IV SOLUTION) ×2 IMPLANT
TOWEL OR 17X26 4PK STRL BLUE (TOWEL DISPOSABLE) ×2 IMPLANT
TUBING CONNECTING 10 (TUBING) ×2 IMPLANT

## 2018-06-27 NOTE — Anesthesia Postprocedure Evaluation (Signed)
Anesthesia Post Note  Patient: Maria Neal  Procedure(s) Performed: DILATATION & CURETTAGE/HYSTEROSCOPY WITH MYOSURE (N/A )  Patient location during evaluation: PACU Anesthesia Type: General Level of consciousness: awake and alert Pain management: pain level controlled Vital Signs Assessment: post-procedure vital signs reviewed and stable Respiratory status: spontaneous breathing, nonlabored ventilation, respiratory function stable and patient connected to nasal cannula oxygen Cardiovascular status: blood pressure returned to baseline and stable Postop Assessment: no apparent nausea or vomiting Anesthetic complications: no     Last Vitals:  Vitals:   06/27/18 1343 06/27/18 1345  BP: 94/70 (!) 101/57  Pulse: 66 81  Resp: 17 19  Temp:  (!) 36.4 C  SpO2: 95% 91%    Last Pain:  Vitals:   06/27/18 1345  TempSrc:   PainSc: 0-No pain                 Precious Haws Leanthony Rhett

## 2018-06-27 NOTE — Anesthesia Procedure Notes (Signed)
Procedure Name: Intubation Performed by: Allean Found, CRNA Pre-anesthesia Checklist: Patient identified, Patient being monitored, Timeout performed, Emergency Drugs available and Suction available Patient Re-evaluated:Patient Re-evaluated prior to induction Oxygen Delivery Method: Circle system utilized Preoxygenation: Pre-oxygenation with 100% oxygen Induction Type: IV induction Ventilation: Mask ventilation without difficulty Laryngoscope Size: 3 and McGraph Grade View: Grade I Tube type: Oral Tube size: 7.0 mm Number of attempts: 1 Airway Equipment and Method: Stylet and Video-laryngoscopy Placement Confirmation: ETT inserted through vocal cords under direct vision,  positive ETCO2 and breath sounds checked- equal and bilateral Secured at: 21 cm Tube secured with: Tape Dental Injury: Teeth and Oropharynx as per pre-operative assessment

## 2018-06-27 NOTE — Transfer of Care (Signed)
Immediate Anesthesia Transfer of Care Note  Patient: Maria Neal  Procedure(s) Performed: DILATATION & CURETTAGE/HYSTEROSCOPY WITH MYOSURE (N/A )  Patient Location: PACU  Anesthesia Type:General  Level of Consciousness: awake  Airway & Oxygen Therapy: Patient Spontanous Breathing and Patient connected to face mask oxygen  Post-op Assessment: Report given to RN and Post -op Vital signs reviewed and stable  Post vital signs: Reviewed  Last Vitals:  Vitals Value Taken Time  BP 107/52 06/27/18 1228  Temp    Pulse 76 06/27/18 1228  Resp 23 06/27/18 1228  SpO2 97 % 06/27/18 1228  Vitals shown include unvalidated device data.  Last Pain:  Vitals:   06/27/18 1017  TempSrc: Temporal         Complications: No apparent anesthesia complications

## 2018-06-27 NOTE — Discharge Instructions (Signed)

## 2018-06-27 NOTE — Anesthesia Post-op Follow-up Note (Signed)
Anesthesia QCDR form completed.        

## 2018-06-27 NOTE — Progress Notes (Signed)
Date of Initial H&P: 06/18/2018  History reviewed, patient examined, no change in status, stable for surgery. Pulmonary exam update: CTAB, no increased work of breathing CV: RRR

## 2018-06-27 NOTE — Op Note (Signed)
Preoperative Diagnosis: 1) 68 y.o. with postmenopausal bleeding 2) Thickened endometrium 3) Endometrial polyp on Pipelle biopsy  Postoperative Diagnosis: 1) 68 y.o. with postmenopausal bleeding 2) Thickened endometrium 3) Endometrial polyp on Pipelle biopsy  Operation Performed: Hysteroscopy, dilation and curettage  Indication: Endometrial polyp  Anesthesia: General  Primary Surgeon: Malachy Mood, MD  Assistant: none  Preoperative Antibiotics: none  Estimated Blood Loss: Minimal  IV Fluids: 5100mL  Urine Output:: ~44mL straight cath  Drains or Tubes: none  Implants: none  Specimens Removed: endometrial curettings  Complications: none  Intraoperative Findings:   Normal cervix, cervical canal.  Normal tubal ostia bilaterally.  A small polyp in the right uterine cornua, and a second small polyp left mid uterine segment.    Patient Condition: stable  Procedure in Detail:  Patient was taken to the operating room were she was administered general endotracheal anesthesia.  She was positioned in the dorsal lithotomy position utilizing Allen stirups, prepped and draped in the usual sterile fashion.  Uterus was noted to be non-enlarged in size, anteverted.   Prior to proceeding with the case a time out was performed.  Attention was turned to the patient's pelvis.  A red rubber catheter was used to empty the patient's bladder.  An operative speculum was placed to allow visualization of the cervix.  The anterior lip of the cervix was grasped with a single tooth tenaculum and the cervix was sequentially dilated using pratt dilators.  The hysteroscope was then advanced into the uterine cavity noting the above findings.  Curettage was conducted with Myosure and the resulting specimen collected and sent to pathology.    The single tooth tenaculum was removed from the cervix.  The tenaculum sites and cervix were noted to be  Hemostatic before removing the operative speculum.  Sponge  needle and instrument counts were corrects times two.  The patient tolerated the procedure well and was taken to the recovery room in stable condition.

## 2018-06-27 NOTE — Anesthesia Preprocedure Evaluation (Signed)
Anesthesia Evaluation  Patient identified by MRN, date of birth, ID band Patient awake    Reviewed: Allergy & Precautions, H&P , NPO status , Patient's Chart, lab work & pertinent test results  History of Anesthesia Complications Negative for: history of anesthetic complications  Airway Mallampati: II  TM Distance: >3 FB Neck ROM: limited    Dental  (+) Edentulous Lower, Edentulous Upper   Pulmonary shortness of breath and with exertion, COPD,  oxygen dependent, former smoker,           Cardiovascular Exercise Tolerance: Poor hypertension, + CAD, +CHF and + DOE  (-) Past MI + dysrhythmias + Cardiac Defibrillator      Neuro/Psych  Headaches, PSYCHIATRIC DISORDERS  Neuromuscular disease    GI/Hepatic negative GI ROS, Neg liver ROS, neg GERD  ,  Endo/Other  negative endocrine ROS  Renal/GU      Musculoskeletal   Abdominal   Peds  Hematology negative hematology ROS (+)   Anesthesia Other Findings Past Medical History: No date: AICD (automatic cardioverter/defibrillator) present No date: Anxiety No date: CAD (coronary artery disease) No date: Cardiomyopathy ([place]) No date: CHF (congestive heart failure) (HCC) No date: COPD (chronic obstructive pulmonary disease) (HCC) No date: Depression No date: Dyspnea No date: Headache No date: History of kidney stones No date: History of shingles No date: Hypertension No date: Insomnia No date: On home oxygen therapy     Comment:  bedtime and prn No date: Restless leg syndrome  Past Surgical History: 04/2014: CARDIAC CATHETERIZATION     Comment:  Dr. Idelle Leech No date: CARDIAC CATHETERIZATION No date: [place] 08/23/2016: CHOLECYSTECTOMY; N/A     Comment:  Procedure: LAPAROSCOPIC CHOLECYSTECTOMY;  Surgeon:               Clayburn Pert, MD;  Location: [facility] ORS;  Service:               General;  Laterality: N/A; No date: HERNIA REPAIR 02/17/2018:  LAPAROSCOPIC APPENDECTOMY; N/A     Comment:  Procedure: APPENDECTOMY LAPAROSCOPIC;  Surgeon: Benjamine Sprague, DO;  Location: [facility] ORS;  Service: General;                Laterality: N/A;  BMI    Body Mass Index: 33.99 kg/m      Reproductive/Obstetrics negative OB ROS                             Anesthesia Physical Anesthesia Plan  ASA: IV  Anesthesia Plan: General ETT   Post-op Pain Management:    Induction: Intravenous  PONV Risk Score and Plan: Ondansetron, Dexamethasone, Midazolam and Treatment may vary due to age or medical condition  Airway Management Planned: Oral ETT  Additional Equipment:   Intra-op Plan:   Post-operative Plan: Extubation in OR  Informed Consent: I have reviewed the patients History and Physical, chart, labs and discussed the procedure including the risks, benefits and alternatives for the proposed anesthesia with the patient or authorized representative who has indicated his/her understanding and acceptance.     Dental Advisory Given  Plan Discussed with: Anesthesiologist, CRNA and Surgeon  Anesthesia Plan Comments: (Patient consented for risks of anesthesia including but not limited to:  - adverse reactions to medications - damage to teeth, lips or other oral mucosa - sore throat or hoarseness - Damage to heart, brain, lungs or loss of  life  Patient voiced understanding.)        Anesthesia Quick Evaluation

## 2018-06-28 ENCOUNTER — Ambulatory Visit: Payer: Medicare Other | Admitting: Nurse Practitioner

## 2018-06-28 ENCOUNTER — Telehealth: Payer: Self-pay | Admitting: Family Medicine

## 2018-06-28 ENCOUNTER — Other Ambulatory Visit: Payer: Self-pay

## 2018-06-28 ENCOUNTER — Encounter: Payer: Self-pay | Admitting: Nurse Practitioner

## 2018-06-28 ENCOUNTER — Ambulatory Visit (INDEPENDENT_AMBULATORY_CARE_PROVIDER_SITE_OTHER): Payer: Medicare Other | Admitting: Nurse Practitioner

## 2018-06-28 VITALS — Temp 98.0°F | Ht 64.0 in | Wt 196.0 lb

## 2018-06-28 DIAGNOSIS — R1013 Epigastric pain: Secondary | ICD-10-CM | POA: Insufficient documentation

## 2018-06-28 DIAGNOSIS — I83812 Varicose veins of left lower extremities with pain: Secondary | ICD-10-CM

## 2018-06-28 LAB — SURGICAL PATHOLOGY

## 2018-06-28 NOTE — H&P (Signed)
Signed         Show:Clear all [x] Manual[x] Template[] Copied  Added by: [x] Malachy Mood, MD  [] Hover for details Date of Initial H&P: 06/18/2018  History reviewed, patient examined, no change in status, stable for surgery. Pulmonary exam update: CTAB, no increased work of breathing CV: RRR

## 2018-06-28 NOTE — Assessment & Plan Note (Addendum)
Intermittent episodes with history of cholescytecomy.  Offered reassurance to daughter and her + provided diet education.  Return for worsening or continued symptoms.  Continue current medication regimen.

## 2018-06-28 NOTE — Telephone Encounter (Signed)
Referral re-sent  Copied from [place] [phone]. Topic: Referral - Question >> Jun 26, 2018  4:42 PM Marin Olp L wrote: Reason for CRM: Patients daughter wants to know if Apolonio Schneiders will resend vein and vascular surgery referral? Patient wasn't able to go right away due to major surgeries.

## 2018-06-28 NOTE — Patient Instructions (Signed)
Gallbladder Eating Plan If you have a gallbladder condition, you may have trouble digesting fats. Eating a low-fat diet can help reduce your symptoms, and may be helpful before and after having surgery to remove your gallbladder (cholecystectomy). Your health care provider may recommend that you work with a diet and nutrition specialist (dietitian) to help you reduce the amount of fat in your diet. What are tips for following this plan? General guidelines  Limit your fat intake to less than 30% of your total daily calories. If you eat around 1,800 calories each day, this is less than 60 grams (g) of fat per day.  Fat is an important part of a healthy diet. Eating a low-fat diet can make it hard to maintain a healthy body weight. Ask your dietitian how much fat, calories, and other nutrients you need each day.  Eat small, frequent meals throughout the day instead of three large meals.  Drink at least 8-10 cups of fluid a day. Drink enough fluid to keep your urine clear or pale yellow.  Limit alcohol intake to no more than 1 drink a day for nonpregnant women and 2 drinks a day for men. One drink equals 12 oz of beer, 5 oz of wine, or 1 oz of hard liquor. Reading food labels  Check Nutrition Facts on food labels for the amount of fat per serving. Choose foods with less than 3 grams of fat per serving. Shopping  Choose nonfat and low-fat healthy foods. Look for the words "nonfat," "low fat," or "fat free."  Avoid buying processed or prepackaged foods. Cooking  Cook using low-fat methods, such as baking, broiling, grilling, or boiling.  Cook with small amounts of healthy fats, such as olive oil, grapeseed oil, canola oil, or sunflower oil. What foods are recommended?   All fresh, frozen, or canned fruits and vegetables.  Whole grains.  Low-fat or non-fat (skim) milk and yogurt.  Lean meat, skinless poultry, fish, eggs, and beans.  Low-fat protein supplement powders or  drinks.  Spices and herbs. What foods are not recommended?  High-fat foods. These include baked goods, fast food, fatty cuts of meat, ice cream, french toast, sweet rolls, pizza, cheese bread, foods covered with butter, creamy sauces, or cheese.  Fried foods. These include french fries, tempura, battered fish, breaded chicken, fried breads, and sweets.  Foods with strong odors.  Foods that cause bloating and gas. Summary  A low-fat diet can be helpful if you have a gallbladder condition, or before and after gallbladder surgery.  Limit your fat intake to less than 30% of your total daily calories. This is about 60 g of fat if you eat 1,800 calories each day.  Eat small, frequent meals throughout the day instead of three large meals. This information is not intended to replace advice given to you by your health care provider. Make sure you discuss any questions you have with your health care provider. Document Released: 12/24/2012 Document Revised: 01/27/2016 Document Reviewed: 01/27/2016 Elsevier Interactive Patient Education  2019 Elsevier Inc.  

## 2018-06-28 NOTE — Progress Notes (Signed)
Temp 98 F (36.7 C) (Oral)   Ht 5\' 4"  (1.626 m)   Wt 196 lb (88.9 kg)   SpO2 92%   BMI 33.64 kg/m    Subjective:    Patient ID: Maria Neal, female    DOB: [birth year], 68 y.o.   MRN: [id number]  HPI: Maria Neal is a 68 y.o. female  Chief Complaint  Patient presents with  . Abdominal Pain    Patient has to eat only bland food. Patient takes Pepto-Bismol often. Pain is above belly button.     . This visit was completed via telephone due to the restrictions of the COVID-19 pandemic. All issues as above were discussed and addressed but no physical exam was performed. If it was felt that the patient should be evaluated in the office, they were directed there. The patient verbally consented to this visit. Patient was unable to complete an audio/visual visit due to Lack of equipment. Due to the catastrophic nature of the COVID-19 pandemic, this visit was done through audio contact only. . Location of the patient: home . Location of the provider: home . Those involved with this call:  . Provider: Marnee Guarneri, DNP . CMA: Merilyn Baba, CMA . Front Desk/Registration: Jill Side  . Time spent on call: 15 minutes on the phone discussing health concerns. 5 minutes total spent in review of patient's record and preparation of their chart. I verified patient identity using two factors (patient name and date of birth). Patient consents verbally to being seen via telemedicine visit today.   ABDOMINAL PAIN  She states no abdominal pain today, has not had in two weeks. Reports all she ate was cereal today.  States when she eats greasy food that is when stomach acts up.  When has the discomfort it is above her belly button and she drinks Pepto Bismol and that improves it.  Has to take Pepto Bismol twice a week at times, sometimes less, and continues to take Protonix daily.  For past two weeks she has tried to avoid greasy food and this has helped with symptoms.  She reports she is being seen  today because her daughter "worries too much about me, but I am okay".  Patient reports this abdominal pain has been going on for months and only if she eats greasy foods. Denies any N&V with pain.  Again she states "my daughter just worries too much".  She states "I feel the best I have in awhile since surgery yesterday".  Of note on review patient just had a hysteroscopy D&C yesterday performed by Dr. Georgianne Fick due to endometrial polyp and bleeding.  Has history of uncomplicated appendicitis in February 2020.  Imaging at that time noted "compatible with acute uncomplicated appendicitis, noting an unusual location of the appendix in the anterior right upper quadrant near the gallbladder fossa".  Has history of cholescystectomy in August 2018.    Spoke to her daughter, who was on phone call as well today.  She states her mom has had this intermittent pain on and off since "she had her gall bladder out", it is not changing or worsening.  She states she was "just worried".  Discussed at length with her and educated her on diet changes after cholecystectomy.  Daughter stated appreciation for education.  Discussed that if worsening pain, then to notify office. Alleviating factors:  Aggravating factors: Status: better Treatments attempted: antacids and PPI Fever: no Nausea: no Vomiting: no Weight loss: no Decreased appetite: no Diarrhea: no  Constipation: no Blood in stool: no Heartburn: no Jaundice: no Rash: no Dysuria/urinary frequency: no Hematuria: no History of sexually transmitted disease: no Recurrent NSAID use: no  Relevant past medical, surgical, family and social history reviewed and updated as indicated. Interim medical history since our last visit reviewed. Allergies and medications reviewed and updated.  Review of Systems  Constitutional: Negative for activity change, appetite change, diaphoresis, fatigue and fever.  Respiratory: Negative for cough, chest tightness and shortness of  breath.   Cardiovascular: Negative for chest pain, palpitations and leg swelling.  Gastrointestinal: Negative for abdominal distention, abdominal pain, blood in stool, constipation, diarrhea, nausea and vomiting.  Neurological: Negative for dizziness, syncope, weakness, light-headedness, numbness and headaches.  Psychiatric/Behavioral: Negative.     Per HPI unless specifically indicated above     Objective:    Temp 98 F (36.7 C) (Oral)   Ht 5\' 4"  (1.626 m)   Wt 196 lb (88.9 kg)   SpO2 92%   BMI 33.64 kg/m   Wt Readings from Last 3 Encounters:  06/28/18 196 lb (88.9 kg)  06/27/18 198 lb 0.1 oz (89.8 kg)  06/24/18 198 lb (89.8 kg)    Physical Exam   Unable to perform today due to telephone visit only.  Results for orders placed or performed during the hospital encounter of 06/27/18  ABO/Rh  Result Value Ref Range   ABO/RH(D)      A POS Performed at [facility], [address] [place], [place] [address]   Surgical pathology  Result Value Ref Range   SURGICAL PATHOLOGY      Surgical Pathology CASE: [id number] PATIENT: Eastyn Pieczynski Surgical Pathology Report     SPECIMEN SUBMITTED: A. Endometrial curettings and polyps  CLINICAL HISTORY: None provided  PRE-OPERATIVE DIAGNOSIS: Endometrial polyp  POST-OPERATIVE DIAGNOSIS: Same as pre-op    DIAGNOSIS: A. ENDOMETRIUM; CURETTAGE AND POLYPECTOMY: - POLYPOID FRAGMENTS OF BENIGN ENDOMETRIAL TISSUE WITH SIMPLE, NON-ATYPICAL HYPERPLASIA. - BACKGROUND DISORDERED PROLIFERATIVE ENDOMETRIUM. - NEGATIVE FOR ATYPICAL HYPERPLASIA/EIN AND MALIGNANCY.  GROSS DESCRIPTION: A. Labeled: Endometrial curettings and polyps Received: Formalin Tissue fragment(s): Multiple Size: Aggregate, 2.0 x 0.5 x 0.2 cm Description: Received are irregular fragments of tan-pink soft tissue. Filtered into a mesh bag. Entirely submitted in cassette 1.   Final Diagnosis performed by Allena Napoleon, MD.   Electronically signed  06/28/2018 11:36:01AM The electronic signature indicates that the named Attending Patholo gist has evaluated the specimen  Technical component performed at [facility], [address] [place], [place] [address] Lab: [phone] Dir: Rush Farmer, MD, MMM  Professional component performed at [facility], [facility], [place], [place], [place] [address] Lab: [phone] Dir: Dellia Nims. Reuel Derby, MD       Assessment & Plan:   Problem List Items Addressed This Visit      Other   Epigastric pain - Primary    Intermittent episodes with history of cholescytecomy.  Offered reassurance to daughter and her + provided diet education.  Return for worsening or continued symptoms.  Continue current medication regimen.         I discussed the assessment and treatment plan with the patient. The patient was provided an opportunity to ask questions and all were answered. The patient agreed with the plan and demonstrated an understanding of the instructions.   The patient was advised to call back or seek an in-person evaluation if the symptoms worsen or if the condition fails to improve as anticipated.   I provided 15 minutes of time during this  encounter.  Follow up plan: Return if symptoms worsen or fail to improve.

## 2018-07-04 ENCOUNTER — Other Ambulatory Visit: Payer: Self-pay

## 2018-07-04 ENCOUNTER — Ambulatory Visit: Payer: Medicare Other

## 2018-07-04 ENCOUNTER — Ambulatory Visit (INDEPENDENT_AMBULATORY_CARE_PROVIDER_SITE_OTHER): Payer: Medicare Other | Admitting: Obstetrics and Gynecology

## 2018-07-04 DIAGNOSIS — N8501 Benign endometrial hyperplasia: Secondary | ICD-10-CM

## 2018-07-04 DIAGNOSIS — Z4889 Encounter for other specified surgical aftercare: Secondary | ICD-10-CM

## 2018-07-04 NOTE — Progress Notes (Signed)
I connected with [name] on 07/04/18 at  2:10 PM EDT by telephone and verified that I am speaking with the correct person using two identifiers.   I discussed the limitations, risks, security and privacy concerns of performing an evaluation and management service by telephone and the availability of in person appointments. I also discussed with the patient that there may be a patient responsible charge related to this service. The patient expressed understanding and agreed to proceed.  The patient was at home I spoke with the patient from my workstation phone The names of people involved in this encounter were: Maria Neal , and Maria Neal    Postoperative Follow-up Patient presents post op from hysteroscopy D&C 1weeks ago for PMB, endometrial polyp.  Subjective: Patient reports marked improvement in her preop symptoms. Eating a regular diet without difficulty. The patient is not having any pain.  Activity: normal activities of daily living.  Objective: There were no vitals taken for this visit.   No physical exam as this was a remote telephone visit to promote social distancing during the current COVID-19 Pandemic   Admission on 06/27/2018, Discharged on 06/27/2018  Component Date Value Ref Range Status  . ABO/RH(D) 06/27/2018    Final                   Value:A POS Performed at [facility], [address], [place], [place] [address]   . SURGICAL PATHOLOGY 06/27/2018    Final                   Value:Surgical Pathology CASE: [id number] PATIENT: Maria Neal Surgical Pathology Report     SPECIMEN SUBMITTED: A. Endometrial curettings and polyps  CLINICAL HISTORY: None provided  PRE-OPERATIVE DIAGNOSIS: Endometrial polyp  POST-OPERATIVE DIAGNOSIS: Same as pre-op    DIAGNOSIS: A. ENDOMETRIUM; CURETTAGE AND POLYPECTOMY: - POLYPOID FRAGMENTS OF BENIGN ENDOMETRIAL TISSUE WITH SIMPLE, NON-ATYPICAL HYPERPLASIA. - BACKGROUND DISORDERED  PROLIFERATIVE ENDOMETRIUM. - NEGATIVE FOR ATYPICAL HYPERPLASIA/EIN AND MALIGNANCY.  GROSS DESCRIPTION: A. Labeled: Endometrial curettings and polyps Received: Formalin Tissue fragment(s): Multiple Size: Aggregate, 2.0 x 0.5 x 0.2 cm Description: Received are irregular fragments of tan-pink soft tissue. Filtered into a mesh bag. Entirely submitted in cassette 1.   Final Diagnosis performed by Allena Napoleon, MD.   Electronically signed 06/28/2018 11:36:01AM The electronic signature indicates that the named Attending Patholo                         gist has evaluated the specimen  Technical component performed at [facility], [address], [place], [place] [address] Lab: [phone] Dir: Rush Farmer, MD, MMM  Professional component performed at [facility], [facility], [place], [place], [place] [address] Lab: [phone] Dir: Dellia Nims. Reuel Derby, MD     Assessment: 68 y.o. s/p hysteroscopy, D&C stable  Plan: Patient has done well after surgery with no apparent complications.  I have discussed the post-operative course to date, and the expected progress moving forward.  The patient understands what complications to be concerned about.  I will see the patient in routine follow up, or sooner if needed.    Activity plan: No restriction.   We discussed that simple hyperplasia without atypia has low malignant potential. Management options may be expectant or medical.  Medical management options generally consist of po progestins although IUD are also an option.  Continued surveillance with endometrial biopsy every 6 months for the following two years  is recommened.  Medical therapy can be continued indefinitely or discontinued after 2 years if normal results on surveillance biopsies and no further bleeding.  Given her cardiac history I will discuss with her cardiologist level of comfort with starting a po progestin.  I think it is reasonable to hold off on po progestin for  now unless follow up surveillance biopsy reveals and residual hyperplasia.       Return in about 5 weeks (around 08/08/2018) for postoperative visit.  Telephone time 5 minutes  Maria Mood, MD, [place], [facility] 07/04/2018, 7:59 AM

## 2018-07-08 ENCOUNTER — Telehealth: Payer: Self-pay | Admitting: Family Medicine

## 2018-07-08 NOTE — Telephone Encounter (Signed)
Patient notified and verbalized understand of what Maria Neal said. Patient states that she does not want to schedule right now because she is better today. Patient states she will definitely call us or go to the ER if pain or symptoms worsen.

## 2018-07-08 NOTE — Telephone Encounter (Signed)
Needs appt, this is lower than the pain she's been discussing with [place] the past month or so - ok to see providers in office tomorrow, last saw Jolene for abdominal pain. May need imaging or GI consultation as this is ongoing issue. Bland diet, and needs to go to ER if pain is worsening, having fevers, not tolerating food or liquids

## 2018-07-08 NOTE — Telephone Encounter (Signed)
Pt daughter called and stated that the patient is having pain around belly button and would like to know what they should do. Pts only relief is pepto. Please advise

## 2018-07-22 ENCOUNTER — Other Ambulatory Visit: Payer: Self-pay

## 2018-07-22 ENCOUNTER — Telehealth (INDEPENDENT_AMBULATORY_CARE_PROVIDER_SITE_OTHER): Payer: Self-pay

## 2018-07-22 ENCOUNTER — Ambulatory Visit (INDEPENDENT_AMBULATORY_CARE_PROVIDER_SITE_OTHER): Payer: Medicare Other | Admitting: Vascular Surgery

## 2018-07-22 ENCOUNTER — Encounter (INDEPENDENT_AMBULATORY_CARE_PROVIDER_SITE_OTHER): Payer: Self-pay | Admitting: Vascular Surgery

## 2018-07-22 VITALS — BP 120/73 | HR 76 | Resp 16 | Ht 64.0 in | Wt 198.0 lb

## 2018-07-22 DIAGNOSIS — I8311 Varicose veins of right lower extremity with inflammation: Secondary | ICD-10-CM

## 2018-07-22 DIAGNOSIS — E7800 Pure hypercholesterolemia, unspecified: Secondary | ICD-10-CM

## 2018-07-22 DIAGNOSIS — I872 Venous insufficiency (chronic) (peripheral): Secondary | ICD-10-CM

## 2018-07-22 DIAGNOSIS — I10 Essential (primary) hypertension: Secondary | ICD-10-CM

## 2018-07-22 DIAGNOSIS — M79604 Pain in right leg: Secondary | ICD-10-CM

## 2018-07-22 DIAGNOSIS — Z79899 Other long term (current) drug therapy: Secondary | ICD-10-CM

## 2018-07-22 DIAGNOSIS — Z87891 Personal history of nicotine dependence: Secondary | ICD-10-CM

## 2018-07-22 DIAGNOSIS — I8312 Varicose veins of left lower extremity with inflammation: Secondary | ICD-10-CM

## 2018-07-22 DIAGNOSIS — J449 Chronic obstructive pulmonary disease, unspecified: Secondary | ICD-10-CM

## 2018-07-22 DIAGNOSIS — I25118 Atherosclerotic heart disease of native coronary artery with other forms of angina pectoris: Secondary | ICD-10-CM | POA: Diagnosis not present

## 2018-07-22 DIAGNOSIS — M79605 Pain in left leg: Secondary | ICD-10-CM

## 2018-07-22 NOTE — Progress Notes (Signed)
MRN : [id number]  Maria Neal is a 68 y.o. (18-Nov-1950) female who presents with chief complaint of  Chief Complaint  Patient presents with   New Patient (Initial Visit)    ref Orene Desanctis for varicose veins  .  History of Present Illness:   The patient is seen for evaluation of symptomatic varicose veins. The patient relates burning and stinging which worsened steadily throughout the course of the day, particularly with standing. The patient also notes an aching and throbbing pain over the varicosities, particularly with prolonged dependent positions. The symptoms are significantly improved with elevation.  The patient also notes that during hot weather the symptoms are greatly intensified. The patient states the pain from the varicose veins interferes with work, daily exercise, shopping and household maintenance. At this point, the symptoms are persistent and severe enough that they're having a negative impact on lifestyle and are interfering with daily activities.  There is no history of DVT, PE or superficial thrombophlebitis. There is no history of ulceration or hemorrhage. The patient denies a significant family history of varicose veins. OB history: G5P3  The patient has not worn graduated compression in the past. At the present time the patient has not been using over-the-counter analgesics. There is no history of prior surgical intervention or sclerotherapy.    Current Meds  Medication Sig   albuterol (PROVENTIL HFA;VENTOLIN HFA) 108 (90 Base) MCG/ACT inhaler Inhale 2 puffs into the lungs every 6 (six) hours as needed for wheezing or shortness of breath.   aspirin EC 81 MG tablet Take 81 mg by mouth daily.   atorvastatin (LIPITOR) 20 MG tablet TAKE 1 TABLET BY MOUTH ONCE DAILY (Patient taking differently: Take 20 mg by mouth daily. )   bismuth subsalicylate (PEPTO BISMOL) 262 MG/15ML suspension Take 30 mLs by mouth every 6 (six) hours as needed.   carvedilol (COREG) 25 MG  tablet Take 25 mg by mouth 2 (two) times daily.   ibuprofen (ADVIL) 800 MG tablet Take 1 tablet (800 mg total) by mouth every 8 (eight) hours as needed for mild pain or moderate pain.   losartan (COZAAR) 50 MG tablet Take 50 mg by mouth daily.   nystatin cream (MYCOSTATIN) Apply 1 application topically 2 (two) times daily.   OXYGEN Inhale 3 L into the lungs 3 (three) times daily as needed (shortness of breath or coughing).   pantoprazole (PROTONIX) 40 MG tablet Take 1 tablet (40 mg total) by mouth daily.   spironolactone (ALDACTONE) 25 MG tablet Take 12.5 mg by mouth daily.    Past Medical History:  Diagnosis Date   AICD (automatic cardioverter/defibrillator) present    Anxiety    CAD (coronary artery disease)    Cardiomyopathy (HCC)    CHF (congestive heart failure) (HCC)    COPD (chronic obstructive pulmonary disease) (HCC)    Depression    Dyspnea    Headache    History of kidney stones    History of shingles    Hypertension    Insomnia    On home oxygen therapy    bedtime and prn   Restless leg syndrome     Past Surgical History:  Procedure Laterality Date   CARDIAC CATHETERIZATION  04/2014   Dr. Idelle Leech   CARDIAC CATHETERIZATION     CARDIAC DEFIBRILLATOR PLACEMENT     CHOLECYSTECTOMY N/A 08/23/2016   Procedure: LAPAROSCOPIC CHOLECYSTECTOMY;  Surgeon: Clayburn Pert, MD;  Location: [facility];  Service: General;  Laterality: N/A;   DILATATION & CURETTAGE/HYSTEROSCOPY  WITH MYOSURE N/A 06/27/2018   Procedure: DILATATION & CURETTAGE/HYSTEROSCOPY WITH MYOSURE;  Surgeon: Malachy Mood, MD;  Location: [facility];  Service: Gynecology;  Laterality: N/A;   HERNIA REPAIR     LAPAROSCOPIC APPENDECTOMY N/A 02/17/2018   Procedure: APPENDECTOMY LAPAROSCOPIC;  Surgeon: Benjamine Sprague, DO;  Location: [facility];  Service: General;  Laterality: N/A;    Social History Social History   Tobacco Use   Smoking status: Former Smoker    Packs/day: 0.25     Types: Cigarettes   Smokeless tobacco: Never Used   Tobacco comment: quit at age 73, whole pack lasted 3 weeks   Substance Use Topics   Alcohol use: No    Alcohol/week: 0.0 standard drinks   Drug use: No    Family History Family History  Problem Relation Age of Onset   Diabetes Mellitus II Mother    Hypertension Mother    Heart attack Father    Heart disease Brother    Arthritis Sister    Depression Daughter    Depression Son    Schizophrenia Son    Arthritis Sister    Diabetes Brother   No family history of bleeding/clotting disorders, porphyria or autoimmune disease   Allergies  Allergen Reactions   Levaquin [Levofloxacin] Anaphylaxis   Amoxicillin Hives    Did it involve swelling of the face/tongue/throat, SOB, or low BP? No Did it involve sudden or severe rash/hives, skin peeling, or any reaction on the inside of your mouth or nose? No Did you need to seek medical attention at a hospital or doctor's office? No When did it last happen?10+ years If all above answers are "NO", may proceed with cephalosporin use.   Nitrofuran Derivatives Other (See Comments)    Unknown   Penicillins Other (See Comments)    Thinks it made her itch a lot, but isn't sure. Did it involve swelling of the face/tongue/throat, SOB, or low BP? No Did it involve sudden or severe rash/hives, skin peeling, or any reaction on the inside of your mouth or nose? No Did you need to seek medical attention at a hospital or doctor's office? No When did it last happen?10+ years If all above answers are "NO", may proceed with cephalosporin use.    Zithromax [Azithromycin] Other (See Comments)   Antihistamines, Chlorpheniramine-Type Rash     REVIEW OF SYSTEMS (Negative unless checked)  Constitutional: [] Weight loss  [] Fever  [] Chills Cardiac: [] Chest pain   [] Chest pressure   [] Palpitations   [] Shortness of breath when laying flat   [] Shortness of breath with  exertion. Vascular:  [] Pain in legs with walking   [x] Pain in legs at rest  [] History of DVT   [] Phlebitis   [x] Swelling in legs   [x] Varicose veins   [] Non-healing ulcers Pulmonary:   [] Uses home oxygen   [] Productive cough   [] Hemoptysis   [] Wheeze  [x] COPD   [] Asthma Neurologic:  [] Dizziness   [] Seizures   [] History of stroke   [] History of TIA  [] Aphasia   [] Vissual changes   [] Weakness or numbness in arm   [] Weakness or numbness in leg Musculoskeletal:   [] Joint swelling   [] Joint pain   [] Low back pain Hematologic:  [] Easy bruising  [] Easy bleeding   [] Hypercoagulable state   [] Anemic Gastrointestinal:  [] Diarrhea   [] Vomiting  [x] Gastroesophageal reflux/heartburn   [] Difficulty swallowing. Genitourinary:  [] Chronic kidney disease   [] Difficult urination  [] Frequent urination   [] Blood in urine Skin:  [] Rashes   [] Ulcers  Psychological:  [] History of anxiety   []   History of major depression.  Physical Examination  Vitals:   07/22/18 1028  BP: 120/73  Pulse: 76  Resp: 16  Weight: 198 lb (89.8 kg)  Height: 5\' 4"  (1.626 m)   Body mass index is 33.99 kg/m. Gen: WD/WN, NAD Head: [place]/AT, No temporalis wasting.  Ear/Nose/Throat: Hearing grossly intact, nares w/o erythema or drainage, poor dentition Eyes: PER, EOMI, sclera nonicteric.  Neck: Supple, no masses.  No bruit or JVD.  Pulmonary:  Good air movement, clear to auscultation bilaterally, no use of accessory muscles.  Cardiac: RRR, normal S1, S2, no Murmurs. Vascular: Large varicosities present extensively greater than 7 mm bilaterally.  Mild venous stasis changes to the legs bilaterally.  2+ soft pitting edema Vessel Right Left  PT Palpable Palpable  DP Palpable Palpable   Gastrointestinal: soft, non-distended. No guarding/no peritoneal signs.  Musculoskeletal: M/S 5/5 throughout.  No deformity or atrophy.  Neurologic: CN 2-12 intact. Pain and light touch intact in extremities.  Symmetrical.  Speech is fluent. Motor exam as  listed above. Psychiatric: Judgment intact, Mood & affect appropriate for pt's clinical situation. Dermatologic: Mild venous rashes no ulcers noted.  No changes consistent with cellulitis. Lymph : No Cervical lymphadenopathy, no lichenification or skin changes of chronic lymphedema.  CBC Lab Results  Component Value Date   WBC 7.0 02/18/2018   HGB 12.9 02/18/2018   HCT 40.7 02/18/2018   MCV 89.8 02/18/2018   PLT 172 02/18/2018    BMET    Component Value Date/Time   NA 138 02/18/2018 0631   NA 141 04/16/2017 1053   NA 139 12/07/2013 1207   K 4.4 02/18/2018 0631   K 3.8 12/07/2013 1207   CL 101 02/18/2018 0631   CL 99 12/07/2013 1207   CO2 28 02/18/2018 0631   CO2 31 12/07/2013 1207   GLUCOSE 146 (H) 02/18/2018 0631   GLUCOSE 97 12/07/2013 1207   BUN 12 02/18/2018 0631   BUN 16 04/16/2017 1053   BUN 8 12/07/2013 1207   CREATININE 0.94 02/18/2018 0631   CREATININE 0.77 12/07/2013 1207   CALCIUM 8.9 02/18/2018 0631   CALCIUM 8.7 12/07/2013 1207   GFRNONAA >60 02/18/2018 0631   GFRNONAA >60 12/07/2013 1207   GFRNONAA >60 09/21/2013 1133   GFRAA >60 02/18/2018 0631   GFRAA >60 12/07/2013 1207   GFRAA >60 09/21/2013 1133   CrCl cannot be calculated (Patient's most recent lab result is older than the maximum 21 days allowed.).  COAG No results found for: INR, PROTIME  Radiology No results found.   Assessment/Plan 1. Pain in both lower extremities  Recommend:  The patient has large symptomatic varicose veins that are painful and associated with swelling.  I have had a long discussion with the patient regarding  varicose veins and why they cause symptoms.  Patient will begin wearing graduated compression stockings class 1 on a daily basis, beginning first thing in the morning and removing them in the evening. The patient is instructed specifically not to sleep in the stockings.    The patient  will also begin using over-the-counter analgesics such as Motrin 600 mg po  TID to help control the symptoms.    In addition, behavioral modification including elevation during the day will be initiated.    Pending the results of these changes the  patient will be reevaluated in three months.   An  ultrasound of the venous system will be obtained.   Further plans will be based on the ultrasound results and whether  conservative therapies are successful at eliminating the pain and swelling.  - VAS [place] LOWER EXTREMITY VENOUS REFLUX; Future  2. Varicose veins of both lower extremities with inflammation  Recommend:  The patient has large symptomatic varicose veins that are painful and associated with swelling.  I have had a long discussion with the patient regarding  varicose veins and why they cause symptoms.  Patient will begin wearing graduated compression stockings class 1 on a daily basis, beginning first thing in the morning and removing them in the evening. The patient is instructed specifically not to sleep in the stockings.    The patient  will also begin using over-the-counter analgesics such as Motrin 600 mg po TID to help control the symptoms.    In addition, behavioral modification including elevation during the day will be initiated.    Pending the results of these changes the  patient will be reevaluated in three months.   An  ultrasound of the venous system will be obtained.   Further plans will be based on the ultrasound results and whether conservative therapies are successful at eliminating the pain and swelling.  - VAS [place] LOWER EXTREMITY VENOUS REFLUX; Future  3. Chronic venous insufficiency No surgery or intervention at this point in time.    I have had a long discussion with the patient regarding venous insufficiency and why it  causes symptoms. I have discussed with the patient the chronic skin changes that accompany venous insufficiency and the long term sequela such as infection and ulceration.  Patient will begin wearing graduated compression  stockings class 1 (20-30 mmHg) or compression wraps on a daily basis a prescription was given. The patient will put the stockings on first thing in the morning and removing them in the evening. The patient is instructed specifically not to sleep in the stockings.    In addition, behavioral modification including several periods of elevation of the lower extremities during the day will be continued. I have demonstrated that proper elevation is a position with the ankles at heart level.  The patient is instructed to begin routine exercise, especially walking on a daily basis  Patient should undergo duplex ultrasound of the venous system to ensure that DVT or reflux is not present.  Following the review of the ultrasound the patient will follow up in 2-3 months to reassess the degree of swelling and the control that graduated compression stockings or compression wraps  is offering.   The patient can be assessed for a Lymph Pump at that time  4. Coronary artery disease of native artery of native heart with stable angina pectoris (HCC) Continue cardiac and antihypertensive medications as already ordered and reviewed, no changes at this time.  Continue statin as ordered and reviewed, no changes at this time  Nitrates PRN for chest pain   5. Benign essential HTN Continue antihypertensive medications as already ordered, these medications have been reviewed and there are no changes at this time.   6. Chronic obstructive pulmonary disease, unspecified COPD type ([place]) Continue pulmonary medications and aerosols as already ordered, these medications have been reviewed and there are no changes at this time.    7. Hypercholesteremia Continue statin as ordered and reviewed, no changes at this time     Hortencia Pilar, MD  07/22/2018 12:26 PM

## 2018-07-22 NOTE — Telephone Encounter (Signed)
Patient daughter left a message stating that her mom was told she needed to get compression hose or socks at her visit and was asking if diabetic socks would do the same for her circulation problem.I spoke with Dr Delana Meyer and he recommended the patient to get the compression hose or socks

## 2018-07-23 DIAGNOSIS — I872 Venous insufficiency (chronic) (peripheral): Secondary | ICD-10-CM | POA: Insufficient documentation

## 2018-07-23 DIAGNOSIS — I8311 Varicose veins of right lower extremity with inflammation: Secondary | ICD-10-CM | POA: Insufficient documentation

## 2018-07-23 DIAGNOSIS — M79606 Pain in leg, unspecified: Secondary | ICD-10-CM | POA: Insufficient documentation

## 2018-07-26 ENCOUNTER — Ambulatory Visit (INDEPENDENT_AMBULATORY_CARE_PROVIDER_SITE_OTHER): Payer: Medicare Other | Admitting: Family Medicine

## 2018-07-26 ENCOUNTER — Encounter: Payer: Self-pay | Admitting: Family Medicine

## 2018-07-26 ENCOUNTER — Other Ambulatory Visit: Payer: Self-pay

## 2018-07-26 VITALS — BP 113/71 | HR 71 | Temp 98.1°F | Ht 64.0 in | Wt 198.0 lb

## 2018-07-26 DIAGNOSIS — H6593 Unspecified nonsuppurative otitis media, bilateral: Secondary | ICD-10-CM | POA: Diagnosis not present

## 2018-07-26 MED ORDER — FLUTICASONE PROPIONATE 50 MCG/ACT NA SUSP: 2.0000 | Freq: Every day | NASAL | 6 refills | Status: AC

## 2018-07-26 NOTE — Progress Notes (Signed)
BP 113/71   Pulse 71   Temp 98.1 F (36.7 C) (Oral)   Ht 5\' 4"  (1.626 m)   Wt 198 lb (89.8 kg)   SpO2 94%   BMI 33.99 kg/m    Subjective:    Patient ID: Maria Neal, female    DOB: May 23, 1950, 68 y.o.   MRN: [id number]  HPI: Maria Neal is a 68 y.o. female  Chief Complaint  Patient presents with  . Ear Pain    bilateral but left is worse. pt states feels pain inside and outside behind the ears. x about  a week. tried OTC sweet oil   2 days of b/l ear pain, pressure and popping, left worse than right. States she has a sore in there she believes. Sweet oil drops are not helping. Denies fevers, chills, muffled hearing, congestion.   Relevant past medical, surgical, family and social history reviewed and updated as indicated. Interim medical history since our last visit reviewed. Allergies and medications reviewed and updated.  Review of Systems  Per HPI unless specifically indicated above     Objective:    BP 113/71   Pulse 71   Temp 98.1 F (36.7 C) (Oral)   Ht 5\' 4"  (1.626 m)   Wt 198 lb (89.8 kg)   SpO2 94%   BMI 33.99 kg/m   Wt Readings from Last 3 Encounters:  07/26/18 198 lb (89.8 kg)  07/22/18 198 lb (89.8 kg)  06/28/18 196 lb (88.9 kg)    Physical Exam Vitals signs and nursing note reviewed.  Constitutional:      Appearance: Normal appearance. She is not ill-appearing.  HENT:     Head: Atraumatic.     Ears:     Comments: Mild middle ear effusion b/l, no erythema or edema    Nose: Nose normal. No rhinorrhea.     Mouth/Throat:     Mouth: Mucous membranes are moist.     Pharynx: Oropharynx is clear.  Eyes:     Extraocular Movements: Extraocular movements intact.     Conjunctiva/sclera: Conjunctivae normal.  Neck:     Musculoskeletal: Normal range of motion and neck supple.  Cardiovascular:     Rate and Rhythm: Normal rate and regular rhythm.     Heart sounds: Normal heart sounds.  Pulmonary:     Effort: Pulmonary effort is normal.     Breath  sounds: Normal breath sounds.  Musculoskeletal: Normal range of motion.  Skin:    General: Skin is warm and dry.  Neurological:     Mental Status: She is alert and oriented to person, place, and time.  Psychiatric:        Mood and Affect: Mood normal.        Thought Content: Thought content normal.        Judgment: Judgment normal.     Results for orders placed or performed during the hospital encounter of 06/27/18  ABO/Rh  Result Value Ref Range   ABO/RH(D)      A POS Performed at [facility], [address] [place], [place] [address]   Surgical pathology  Result Value Ref Range   SURGICAL PATHOLOGY      Surgical Pathology CASE: [id number] PATIENT: [name] Surgical Pathology Report     SPECIMEN SUBMITTED: A. Endometrial curettings and polyps  CLINICAL HISTORY: None provided  PRE-OPERATIVE DIAGNOSIS: Endometrial polyp  POST-OPERATIVE DIAGNOSIS: Same as pre-op    DIAGNOSIS: A. ENDOMETRIUM; CURETTAGE AND POLYPECTOMY: - POLYPOID FRAGMENTS OF BENIGN  ENDOMETRIAL TISSUE WITH SIMPLE, NON-ATYPICAL HYPERPLASIA. - BACKGROUND DISORDERED PROLIFERATIVE ENDOMETRIUM. - NEGATIVE FOR ATYPICAL HYPERPLASIA/EIN AND MALIGNANCY.  GROSS DESCRIPTION: A. Labeled: Endometrial curettings and polyps Received: Formalin Tissue fragment(s): Multiple Size: Aggregate, 2.0 x 0.5 x 0.2 cm Description: Received are irregular fragments of tan-pink soft tissue. Filtered into a mesh bag. Entirely submitted in cassette 1.   Final Diagnosis performed by Allena Napoleon, MD.   Electronically signed 06/28/2018 11:36:01AM The electronic signature indicates that the named Attending Patholo gist has evaluated the specimen  Technical component performed at [facility], [address], [place], [place] [address] Lab: [phone] Dir: Rush Farmer, MD, MMM  Professional component performed at [facility], [facility], [place], [place], [place] [address] Lab:  [phone] Dir: Dellia Nims. Rubinas, MD       Assessment & Plan:   Problem List Items Addressed This Visit    None    Visit Diagnoses    Fluid level behind tympanic membrane of both ears    -  Primary   Flonase BID, antihistamine. No sign of infection at this time       Follow up plan: Return if symptoms worsen or fail to improve.

## 2018-08-07 ENCOUNTER — Ambulatory Visit: Payer: Medicare Other | Admitting: Obstetrics and Gynecology

## 2018-08-09 ENCOUNTER — Ambulatory Visit: Payer: Medicare Other | Admitting: Obstetrics and Gynecology

## 2018-08-23 ENCOUNTER — Ambulatory Visit (INDEPENDENT_AMBULATORY_CARE_PROVIDER_SITE_OTHER): Payer: Medicare Other | Admitting: Obstetrics and Gynecology

## 2018-08-23 ENCOUNTER — Other Ambulatory Visit: Payer: Self-pay

## 2018-08-23 ENCOUNTER — Encounter: Payer: Self-pay | Admitting: Obstetrics and Gynecology

## 2018-08-23 VITALS — BP 108/62 | HR 77 | Wt 199.0 lb

## 2018-08-23 DIAGNOSIS — N8501 Benign endometrial hyperplasia: Secondary | ICD-10-CM

## 2018-08-23 DIAGNOSIS — Z4889 Encounter for other specified surgical aftercare: Secondary | ICD-10-CM | POA: Diagnosis not present

## 2018-08-23 NOTE — Progress Notes (Signed)
      Postoperative Follow-up Patient presents post op from hysteroscopy, D&C 6weeks ago for postmenopausal bleeding and endometrial polyp.  Subjective: Patient reports marked improvement in her preop symptoms. Eating a regular diet without difficulty. The patient is not having any pain. related to her surgery but is reporting supraumbilical pain.   Activity: normal activities of daily living.  Objective: Blood pressure 108/62, pulse 77, weight 199 lb (90.3 kg).  General: NAD Pulmonary: no increased work of breathing Abdomen: soft, non-tender, non-distended, incision(s) D/C/I GU: normal external female genitalia normal cervix, no CMT, uterus normal in shape and contour, no adnexal tenderness or masses Extremities: no edema Neurologic: normal gait    Admission on 06/27/2018, Discharged on 06/27/2018  Component Date Value Ref Range Status  . ABO/RH(D) 06/27/2018    Final                   Value:A POS Performed at [facility], [address], [place], [place] [address]   . SURGICAL PATHOLOGY 06/27/2018    Final                   Value:Surgical Pathology CASE: [id number] PATIENT: Gurpreet Guerrier Surgical Pathology Report     SPECIMEN SUBMITTED: A. Endometrial curettings and polyps  CLINICAL HISTORY: None provided  PRE-OPERATIVE DIAGNOSIS: Endometrial polyp  POST-OPERATIVE DIAGNOSIS: Same as pre-op    DIAGNOSIS: A. ENDOMETRIUM; CURETTAGE AND POLYPECTOMY: - POLYPOID FRAGMENTS OF BENIGN ENDOMETRIAL TISSUE WITH SIMPLE, NON-ATYPICAL HYPERPLASIA. - BACKGROUND DISORDERED PROLIFERATIVE ENDOMETRIUM. - NEGATIVE FOR ATYPICAL HYPERPLASIA/EIN AND MALIGNANCY.  GROSS DESCRIPTION: A. Labeled: Endometrial curettings and polyps Received: Formalin Tissue fragment(s): Multiple Size: Aggregate, 2.0 x 0.5 x 0.2 cm Description: Received are irregular fragments of tan-pink soft tissue. Filtered into a mesh bag. Entirely submitted in cassette 1.   Final Diagnosis  performed by Allena Napoleon, MD.   Electronically signed 06/28/2018 11:36:01AM The electronic signature indicates that the named Attending Patholo                         gist has evaluated the specimen  Technical component performed at [facility], [address], [place], [place] [address] Lab: [phone] Dir: Rush Farmer, MD, MMM  Professional component performed at [facility], [facility], [place], [place], [place] [address] Lab: [phone] Dir: Dellia Nims. Reuel Derby, MD     Assessment: 68 y.o. s/p hysteroscopy, D&C stable  Plan: Patient has done well after surgery with no apparent complications.  I have discussed the post-operative course to date, and the expected progress moving forward.  The patient understands what complications to be concerned about.  I will see the patient in routine follow up, or sooner if needed.    Activity plan: No restriction.  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  We will start with expectant management repeat endometrial biopsy in 3 months.  If residual hyperplasia will add progestin therapy.       2) Return in about 2 months (around 10/23/2018) for endometrial biopsy.   Malachy Mood, MD, [facility], [facility] 08/23/2018, 10:53 AM

## 2018-09-05 ENCOUNTER — Encounter: Payer: Self-pay | Admitting: Unknown Physician Specialty

## 2018-09-05 ENCOUNTER — Ambulatory Visit (INDEPENDENT_AMBULATORY_CARE_PROVIDER_SITE_OTHER): Payer: Medicare Other | Admitting: Unknown Physician Specialty

## 2018-09-05 ENCOUNTER — Other Ambulatory Visit: Payer: Self-pay

## 2018-09-05 VITALS — BP 129/75 | HR 85 | Temp 98.4°F

## 2018-09-05 DIAGNOSIS — R109 Unspecified abdominal pain: Secondary | ICD-10-CM

## 2018-09-05 DIAGNOSIS — R1011 Right upper quadrant pain: Secondary | ICD-10-CM

## 2018-09-05 LAB — MICROSCOPIC EXAMINATION
Bacteria, UA: NONE SEEN
RBC, Urine: NONE SEEN /hpf (ref 0–2)
WBC, UA: NONE SEEN /hpf (ref 0–5)

## 2018-09-05 LAB — CBC WITH DIFFERENTIAL/PLATELET
Hematocrit: 40.4 % (ref 34.0–46.6)
Hemoglobin: 13.6 g/dL (ref 11.1–15.9)
Lymphocytes Absolute: 2.7 10*3/uL (ref 0.7–3.1)
Lymphs: 43 %
MCH: 29.8 pg (ref 26.6–33.0)
MCHC: 33.7 g/dL (ref 31.5–35.7)
MCV: 88 fL (ref 79–97)
MID (Absolute): 0.7 10*3/uL (ref 0.1–1.6)
MID: 12 %
Neutrophils Absolute: 2.8 10*3/uL (ref 1.4–7.0)
Neutrophils: 45 %
Platelets: 178 10*3/uL (ref 150–450)
RBC: 4.57 x10E6/uL (ref 3.77–5.28)
RDW: 15.6 % — ABNORMAL HIGH (ref 11.7–15.4)
WBC: 6.2 10*3/uL (ref 3.4–10.8)

## 2018-09-05 LAB — UA/M W/RFLX CULTURE, ROUTINE
Bilirubin, UA: NEGATIVE
Glucose, UA: NEGATIVE
Ketones, UA: NEGATIVE
Leukocytes,UA: NEGATIVE
Nitrite, UA: NEGATIVE
RBC, UA: NEGATIVE
Specific Gravity, UA: 1.025 (ref 1.005–1.030)
Urobilinogen, Ur: 0.2 mg/dL (ref 0.2–1.0)
pH, UA: 5 (ref 5.0–7.5)

## 2018-09-05 NOTE — Progress Notes (Signed)
BP 129/75   Pulse 85   Temp 98.4 F (36.9 C) (Oral)   SpO2 93%    Subjective:    Patient ID: Maria Neal, female    DOB: October 16, 1950, 68 y.o.   MRN: [id number]  HPI: Maria Neal is a 68 y.o. female  Chief Complaint  Patient presents with  . Abdominal Pain    pt states she has a pain right above her belly button that comes and goes for the last 2 weeks, states it swells up and hurts sometimes   Pt states she has had abdominal pain off and on for 3 months.  She had a gyn exam and checked out OK.  States the pain is worse with walking.  When it's worse she has to lie down, prop feet up, takes Tylenol and waits 6-8 hours.  Pain is getting worse and last night was awake until 4:30 with pain. States it is so tight feels it is going to "pop." No constipation or diarrhea.  No problems urinating.   Hx of hernia repair, gall bladder, and "2 female operations."  This is a problem that has been coming and going for quite some time  Past Surgical History:  Procedure Laterality Date  . CARDIAC CATHETERIZATION  04/2014   Dr. Idelle Leech  . CARDIAC CATHETERIZATION    . CARDIAC DEFIBRILLATOR PLACEMENT    . CHOLECYSTECTOMY N/A 08/23/2016   Procedure: LAPAROSCOPIC CHOLECYSTECTOMY;  Surgeon: Clayburn Pert, MD;  Location: [facility] ORS;  Service: General;  Laterality: N/A;  . DILATATION & CURETTAGE/HYSTEROSCOPY WITH MYOSURE N/A 06/27/2018   Procedure: DILATATION & CURETTAGE/HYSTEROSCOPY WITH MYOSURE;  Surgeon: Malachy Mood, MD;  Location: [facility] ORS;  Service: Gynecology;  Laterality: N/A;  . HERNIA REPAIR    . LAPAROSCOPIC APPENDECTOMY N/A 02/17/2018   Procedure: APPENDECTOMY LAPAROSCOPIC;  Surgeon: Benjamine Sprague, DO;  Location: [facility] ORS;  Service: General;  Laterality: N/A;   Past Medical History:  Diagnosis Date  . AICD (automatic cardioverter/defibrillator) present   . Anxiety   . CAD (coronary artery disease)   . Cardiomyopathy ([place])   . CHF (congestive heart failure) ([place])   . COPD (chronic  obstructive pulmonary disease) ([place])   . Depression   . Dyspnea   . Headache   . History of kidney stones   . History of shingles   . Hypertension   . Insomnia   . On home oxygen therapy    bedtime and prn  . Restless leg syndrome     Relevant past medical, surgical, family and social history reviewed and updated as indicated. Interim medical history since our last visit reviewed. Allergies and medications reviewed and updated.  Review of Systems  Per HPI unless specifically indicated above     Objective:    BP 129/75   Pulse 85   Temp 98.4 F (36.9 C) (Oral)   SpO2 93%   Wt Readings from Last 3 Encounters:  08/23/18 199 lb (90.3 kg)  07/26/18 198 lb (89.8 kg)  07/22/18 198 lb (89.8 kg)    Physical Exam Constitutional:      General: She is not in acute distress.    Appearance: Normal appearance. She is well-developed.  HENT:     Head: Normocephalic and atraumatic.  Eyes:     General: Lids are normal. No scleral icterus.       Right eye: No discharge.        Left eye: No discharge.     Conjunctiva/sclera: Conjunctivae normal.  Cardiovascular:  Rate and Rhythm: Normal rate.     Heart sounds: Murmur present.  Pulmonary:     Effort: Pulmonary effort is normal.  Abdominal:     General: Bowel sounds are decreased.     Palpations: Abdomen is soft. There is no hepatomegaly, splenomegaly or mass.     Tenderness: There is abdominal tenderness in the epigastric area and left upper quadrant. There is no guarding or rebound.  Musculoskeletal: Normal range of motion.  Skin:    General: Skin is warm and dry.     Coloration: Skin is not pale.     Findings: No rash.  Neurological:     Mental Status: She is alert and oriented to person, place, and time.  Psychiatric:        Behavior: Behavior normal.        Thought Content: Thought content normal.        Judgment: Judgment normal.   CBC normal with WBC of 6.2 Urine is negative  Results for orders placed or  performed during the hospital encounter of 06/27/18  ABO/Rh  Result Value Ref Range   ABO/RH(D)      A POS Performed at [facility], [address], [place], [place] [address]   Surgical pathology  Result Value Ref Range   SURGICAL PATHOLOGY      Surgical Pathology CASE: [id number] PATIENT: Maria Neal Surgical Pathology Report     SPECIMEN SUBMITTED: A. Endometrial curettings and polyps  CLINICAL HISTORY: None provided  PRE-OPERATIVE DIAGNOSIS: Endometrial polyp  POST-OPERATIVE DIAGNOSIS: Same as pre-op    DIAGNOSIS: A. ENDOMETRIUM; CURETTAGE AND POLYPECTOMY: - POLYPOID FRAGMENTS OF BENIGN ENDOMETRIAL TISSUE WITH SIMPLE, NON-ATYPICAL HYPERPLASIA. - BACKGROUND DISORDERED PROLIFERATIVE ENDOMETRIUM. - NEGATIVE FOR ATYPICAL HYPERPLASIA/EIN AND MALIGNANCY.  GROSS DESCRIPTION: A. Labeled: Endometrial curettings and polyps Received: Formalin Tissue fragment(s): Multiple Size: Aggregate, 2.0 x 0.5 x 0.2 cm Description: Received are irregular fragments of tan-pink soft tissue. Filtered into a mesh bag. Entirely submitted in cassette 1.   Final Diagnosis performed by Allena Napoleon, MD.   Electronically signed 06/28/2018 11:36:01AM The electronic signature indicates that the named Attending Patholo gist has evaluated the specimen  Technical component performed at [facility], [address], [place], [place] [address] Lab: [phone] Dir: Rush Farmer, MD, MMM  Professional component performed at [facility], [facility], [place], [place], [place] [address] Lab: [phone] Dir: Dellia Nims. Reuel Derby, MD       Assessment & Plan:   Problem List Items Addressed This Visit    None    Visit Diagnoses    Abdominal pain, unspecified abdominal location    -  Primary   RUQ and epigastic  abdominal pain. This has been coming and going for a period of time.  Will rx KUB and refer to GI   Relevant Orders   UA/M w/rflx Culture, Routine   DG  Abd 1 View   Ambulatory referral to Gastroenterology   Right upper quadrant abdominal pain       Relevant Orders   CBC With Differential/Platelet   Comprehensive metabolic panel   DG Abd 1 View   Ambulatory referral to Gastroenterology       Follow up plan: CT if abd x-ray suggests further imaging.  Will refer to GI for further management

## 2018-09-06 LAB — COMPREHENSIVE METABOLIC PANEL
ALT: 12 IU/L (ref 0–32)
AST: 25 IU/L (ref 0–40)
Albumin/Globulin Ratio: 1.9 (ref 1.2–2.2)
Albumin: 4.9 g/dL — ABNORMAL HIGH (ref 3.8–4.8)
Alkaline Phosphatase: 79 IU/L (ref 39–117)
BUN/Creatinine Ratio: 26 (ref 12–28)
BUN: 30 mg/dL — ABNORMAL HIGH (ref 8–27)
Bilirubin Total: 0.7 mg/dL (ref 0.0–1.2)
CO2: 26 mmol/L (ref 20–29)
Calcium: 10.1 mg/dL (ref 8.7–10.3)
Chloride: 103 mmol/L (ref 96–106)
Creatinine, Ser: 1.14 mg/dL — ABNORMAL HIGH (ref 0.57–1.00)
GFR calc Af Amer: 57 mL/min/{1.73_m2} — ABNORMAL LOW (ref >59)
GFR calc non Af Amer: 50 mL/min/{1.73_m2} — ABNORMAL LOW (ref >59)
Globulin, Total: 2.6 g/dL (ref 1.5–4.5)
Glucose: 107 mg/dL — ABNORMAL HIGH (ref 65–99)
Potassium: 5.1 mmol/L (ref 3.5–5.2)
Sodium: 146 mmol/L — ABNORMAL HIGH (ref 134–144)
Total Protein: 7.5 g/dL (ref 6.0–8.5)

## 2018-10-05 ENCOUNTER — Other Ambulatory Visit: Payer: Self-pay | Admitting: Family Medicine

## 2018-10-06 ENCOUNTER — Other Ambulatory Visit: Payer: Self-pay

## 2018-10-06 ENCOUNTER — Emergency Department
Admission: EM | Admit: 2018-10-06 | Discharge: 2018-10-07 | Disposition: A | Discharge status: Home / Self Care | Payer: Medicare Other | Attending: Student | Admitting: Student

## 2018-10-06 ENCOUNTER — Emergency Department: Payer: Medicare Other

## 2018-10-06 DIAGNOSIS — Z79899 Other long term (current) drug therapy: Secondary | ICD-10-CM | POA: Insufficient documentation

## 2018-10-06 DIAGNOSIS — Z87891 Personal history of nicotine dependence: Secondary | ICD-10-CM | POA: Insufficient documentation

## 2018-10-06 DIAGNOSIS — Z7982 Long term (current) use of aspirin: Secondary | ICD-10-CM | POA: Insufficient documentation

## 2018-10-06 DIAGNOSIS — I110 Hypertensive heart disease with heart failure: Secondary | ICD-10-CM | POA: Diagnosis not present

## 2018-10-06 DIAGNOSIS — J449 Chronic obstructive pulmonary disease, unspecified: Secondary | ICD-10-CM | POA: Diagnosis not present

## 2018-10-06 DIAGNOSIS — I5022 Chronic systolic (congestive) heart failure: Secondary | ICD-10-CM | POA: Insufficient documentation

## 2018-10-06 DIAGNOSIS — I2510 Atherosclerotic heart disease of native coronary artery without angina pectoris: Secondary | ICD-10-CM | POA: Insufficient documentation

## 2018-10-06 DIAGNOSIS — R1013 Epigastric pain: Secondary | ICD-10-CM | POA: Diagnosis not present

## 2018-10-06 DIAGNOSIS — N390 Urinary tract infection, site not specified: Secondary | ICD-10-CM | POA: Diagnosis not present

## 2018-10-06 DIAGNOSIS — R109 Unspecified abdominal pain: Secondary | ICD-10-CM | POA: Diagnosis present

## 2018-10-06 LAB — COMPREHENSIVE METABOLIC PANEL
ALT: 16 U/L (ref 0–44)
AST: 22 U/L (ref 15–41)
Albumin: 4.3 g/dL (ref 3.5–5.0)
Alkaline Phosphatase: 72 U/L (ref 38–126)
Anion gap: 9 (ref 5–15)
BUN: 22 mg/dL (ref 8–23)
CO2: 29 mmol/L (ref 22–32)
Calcium: 9.5 mg/dL (ref 8.9–10.3)
Chloride: 102 mmol/L (ref 98–111)
Creatinine, Ser: 1.06 mg/dL — ABNORMAL HIGH (ref 0.44–1.00)
GFR calc Af Amer: >60 mL/min (ref >60)
GFR calc non Af Amer: 54 mL/min — ABNORMAL LOW (ref >60)
Glucose, Bld: 78 mg/dL (ref 70–99)
Potassium: 4.9 mmol/L (ref 3.5–5.1)
Sodium: 140 mmol/L (ref 135–145)
Total Bilirubin: 0.9 mg/dL (ref 0.3–1.2)
Total Protein: 7.5 g/dL (ref 6.5–8.1)

## 2018-10-06 LAB — CBC
HCT: 38.8 % (ref 36.0–46.0)
Hemoglobin: 12.7 g/dL (ref 12.0–15.0)
MCH: 29.4 pg (ref 26.0–34.0)
MCHC: 32.7 g/dL (ref 30.0–36.0)
MCV: 89.8 fL (ref 80.0–100.0)
Platelets: 180 10*3/uL (ref 150–400)
RBC: 4.32 MIL/uL (ref 3.87–5.11)
RDW: 13.9 % (ref 11.5–15.5)
WBC: 7.7 10*3/uL (ref 4.0–10.5)
nRBC: 0 % (ref 0.0–0.2)

## 2018-10-06 LAB — URINALYSIS, COMPLETE (UACMP) WITH MICROSCOPIC
Bilirubin Urine: NEGATIVE
Glucose, UA: NEGATIVE mg/dL
Hgb urine dipstick: NEGATIVE
Ketones, ur: NEGATIVE mg/dL
Nitrite: NEGATIVE
Protein, ur: NEGATIVE mg/dL
Specific Gravity, Urine: 1.012 (ref 1.005–1.030)
pH: 6 (ref 5.0–8.0)

## 2018-10-06 LAB — LIPASE, BLOOD: Lipase: 35 U/L (ref 11–51)

## 2018-10-06 MED ORDER — LIDOCAINE VISCOUS HCL 2 % MT SOLN
15.0000 mL | Freq: Once | OROMUCOSAL | Status: AC
  Administered 2018-10-06: 15 mL via ORAL
  Filled 2018-10-06: qty 15

## 2018-10-06 MED ORDER — SULFAMETHOXAZOLE-TRIMETHOPRIM 800-160 MG PO TABS
1.0000 | ORAL_TABLET | Freq: Once | ORAL | Status: AC
  Administered 2018-10-06: 23:00:00 1 via ORAL
  Filled 2018-10-06: qty 1

## 2018-10-06 MED ORDER — IOHEXOL 300 MG/ML  SOLN
100.0000 mL | Freq: Once | INTRAMUSCULAR | Status: AC | PRN
  Administered 2018-10-06: 23:00:00 100 mL via INTRAVENOUS

## 2018-10-06 MED ORDER — SODIUM CHLORIDE 0.9% FLUSH: 3.0000 mL | Freq: Once | INTRAVENOUS | Status: DC

## 2018-10-06 MED ORDER — SULFAMETHOXAZOLE-TRIMETHOPRIM 800-160 MG PO TABS: 1.0000 | ORAL_TABLET | Freq: Two times a day (BID) | ORAL | 0 refills | Status: DC

## 2018-10-06 MED ORDER — ALUM & MAG HYDROXIDE-SIMETH 200-200-20 MG/5ML PO SUSP
30.0000 mL | Freq: Once | ORAL | Status: AC
  Administered 2018-10-06: 30 mL via ORAL
  Filled 2018-10-06: qty 30

## 2018-10-06 NOTE — ED Notes (Signed)
Patient states midabdominal pain x 3 days. Feels bloated, complains of dysuria, urgency and frequency. Feels like she gets bloated easily. Has cut her soda intake down to one per day and drinks "a lot of water." States some nausea without vomiting or diarrhea.

## 2018-10-06 NOTE — ED Provider Notes (Signed)
[facility] Emergency Department Provider Note  ____________________________________________  Time seen: Approximately 8:29 PM  I have reviewed the triage vital signs and the nursing notes.   HISTORY  Chief Complaint Abdominal Pain    HPI Maria Neal is a 68 y.o. female that presents to the emergency department for evaluation of mid abdominal discomfort for 3 days.  Patient states that abdominal discomfort has been intermittent for several months.  She has had some urinary urgency and frequency but no dysuria.  She was referred to GI by primary care and has her follow-up appointment tomorrow.  No fever, shortness of breath, chest pain, vomiting, diarrhea.  Past Medical History:  Diagnosis Date  . AICD (automatic cardioverter/defibrillator) present   . Anxiety   . CAD (coronary artery disease)   . Cardiomyopathy ([place])   . CHF (congestive heart failure) ([place])   . COPD (chronic obstructive pulmonary disease) ([place])   . Depression   . Dyspnea   . Headache   . History of kidney stones   . History of shingles   . Hypertension   . Insomnia   . On home oxygen therapy    bedtime and prn  . Restless leg syndrome     Patient Active Problem List   Diagnosis Date Noted  . Leg pain 07/23/2018  . 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/23/2018  . Chronic venous insufficiency 07/23/2018  . Simple endometrial hyperplasia without atypia 07/04/2018  . Epigastric pain 06/28/2018  . Acute appendicitis 02/17/2018  . Cervical radiculitis 05/17/2017  . Hypercholesteremia 05/02/2017  . Obesity (BMI 30-39.9) 05/02/2017  . Mitral valve insufficiency 04/16/2017  . Closed fracture of lateral malleolus 03/08/2017  . Chronic systolic congestive heart failure, NYHA class 3 ([place]) 08/07/2016  . NICM (nonischemic cardiomyopathy) ([place]) 08/07/2016  . Bundle branch block, left 10/02/2014  . Benign essential HTN 05/07/2014  . CAD (coronary artery disease)  05/07/2014  . COPD (chronic obstructive pulmonary disease) ([place]) 05/07/2014  . S/P cardiac catheterization 04/23/2014  . SOB (shortness of breath) on exertion 11/06/2013    Past Surgical History:  Procedure Laterality Date  . CARDIAC CATHETERIZATION  04/2014   Dr. Idelle Leech  . CARDIAC CATHETERIZATION    . CARDIAC DEFIBRILLATOR PLACEMENT    . CHOLECYSTECTOMY N/A 08/23/2016   Procedure: LAPAROSCOPIC CHOLECYSTECTOMY;  Surgeon: Clayburn Pert, MD;  Location: [facility] ORS;  Service: General;  Laterality: N/A;  . DILATATION & CURETTAGE/HYSTEROSCOPY WITH MYOSURE N/A 06/27/2018   Procedure: DILATATION & CURETTAGE/HYSTEROSCOPY WITH MYOSURE;  Surgeon: Malachy Mood, MD;  Location: [facility] ORS;  Service: Gynecology;  Laterality: N/A;  . HERNIA REPAIR    . LAPAROSCOPIC APPENDECTOMY N/A 02/17/2018   Procedure: APPENDECTOMY LAPAROSCOPIC;  Surgeon: Benjamine Sprague, DO;  Location: [facility] ORS;  Service: General;  Laterality: N/A;    Prior to Admission medications   Medication Sig Start Date End Date Taking? Authorizing Provider  albuterol (PROVENTIL HFA;VENTOLIN HFA) 108 (90 Base) MCG/ACT inhaler Inhale 2 puffs into the lungs every 6 (six) hours as needed for wheezing or shortness of breath.    [provider]  aspirin EC 81 MG tablet Take 81 mg by mouth daily.    [provider]  atorvastatin (LIPITOR) 20 MG tablet TAKE 1 TABLET BY MOUTH ONCE DAILY Patient taking differently: Take 20 mg by mouth daily.  05/01/18   Volney American, PA-C  carvedilol (COREG) 25 MG tablet Take 25 mg by mouth 2 (two) times daily. 04/12/17   [provider]  fluticasone (  FLONASE) 50 MCG/ACT nasal spray Place 2 sprays into both nostrils daily. 07/26/18   Volney American, PA-C  HYDROcodone-acetaminophen (NORCO/VICODIN) 5-325 MG tablet Take 1 tablet by mouth every 6 (six) hours as needed. Patient not taking: Reported on 08/23/2018 06/27/18   Malachy Mood, MD  ibuprofen (ADVIL) 800 MG tablet Take  1 tablet (800 mg total) by mouth every 8 (eight) hours as needed for mild pain or moderate pain. 06/27/18   Malachy Mood, MD  losartan (COZAAR) 50 MG tablet Take 50 mg by mouth daily. 04/10/17   [provider]  nystatin cream (MYCOSTATIN) Apply 1 application topically 2 (two) times daily. 05/08/18   Volney American, PA-C  OXYGEN Inhale 3 L into the lungs 3 (three) times daily as needed (shortness of breath or coughing).    [provider]  pantoprazole (PROTONIX) 40 MG tablet TAKE 1 TABLET BY MOUTH ONCE DAILY 10/05/18   Volney American, PA-C  spironolactone (ALDACTONE) 25 MG tablet Take 12.5 mg by mouth daily. 04/09/17   [provider]    Allergies Levaquin [levofloxacin]; Amoxicillin; Nitrofuran derivatives; Penicillins; Zithromax [azithromycin]; and Antihistamines, chlorpheniramine-type  Family History  Problem Relation Age of Onset  . Diabetes Mellitus II Mother   . Hypertension Mother   . Heart attack Father   . Heart disease Brother   . Arthritis Sister   . Depression Daughter   . Depression Son   . Schizophrenia Son   . Arthritis Sister   . Diabetes Brother     Social History Social History   Tobacco Use  . Smoking status: Former Smoker    Packs/day: 0.25    Types: Cigarettes  . Smokeless tobacco: Never Used  . Tobacco comment: quit at age 36, whole pack lasted 3 weeks   Substance Use Topics  . Alcohol use: No    Alcohol/week: 0.0 standard drinks  . Drug use: No     Review of Systems  Constitutional: No fever/chills Cardiovascular: No chest pain. Respiratory: No cough. No SOB. Gastrointestinal: Positive for abdominal pain.  Positive for nausea.  No vomiting.  Genitourinary: Positive for urgency and frequency. Musculoskeletal: Negative for musculoskeletal pain. Skin: Negative for rash, abrasions, lacerations, ecchymosis. Neurological: Negative for headaches   ____________________________________________   PHYSICAL  EXAM:  VITAL SIGNS: ED Triage Vitals  Enc Vitals Group     BP 10/06/18 1729 118/69     Pulse Rate 10/06/18 1729 77     Resp 10/06/18 1729 18     Temp 10/06/18 1729 98.3 F (36.8 C)     Temp Source 10/06/18 1729 Oral     SpO2 10/06/18 1729 99 %     Weight 10/06/18 1732 189 lb (85.7 kg)     Height 10/06/18 1732 5\' 4"  (1.626 m)     Head Circumference --      Peak Flow --      Pain Score 10/06/18 1732 8     Pain Loc --      Pain Edu? --      Excl. in [place]? --      Constitutional: Alert and oriented. Well appearing and in no acute distress. Eyes: Conjunctivae are normal. PERRL. EOMI. Head: Atraumatic. ENT:      Ears:      Nose: No congestion/rhinnorhea.      Mouth/Throat: Mucous membranes are moist.  Neck: No stridor. Cardiovascular: Normal rate, regular rhythm.  Good peripheral circulation. Respiratory: Normal respiratory effort without tachypnea or retractions. Lungs CTAB. Good air entry  to the bases with no decreased or absent breath sounds. Gastrointestinal: Bowel sounds 4 quadrants.  Epigastric tenderness. No guarding or rigidity. No palpable masses. No distention. No CVA tenderness. Musculoskeletal: Full range of motion to all extremities. No gross deformities appreciated. Neurologic:  Normal speech and language. No gross focal neurologic deficits are appreciated.  Skin:  Skin is warm, dry and intact. No rash noted. Psychiatric: Mood and affect are normal. Speech and behavior are normal. Patient exhibits appropriate insight and judgement.   ____________________________________________   LABS (all labs ordered are listed, but only abnormal results are displayed)  Labs Reviewed  COMPREHENSIVE METABOLIC PANEL - Abnormal; Notable for the following components:      Result Value   Creatinine, Ser 1.06 (*)    GFR calc non Af Amer 54 (*)    All other components within normal limits  URINALYSIS, COMPLETE (UACMP) WITH MICROSCOPIC - Abnormal; Notable for the following  components:   Color, Urine YELLOW (*)    APPearance HAZY (*)    Leukocytes,Ua TRACE (*)    Bacteria, UA RARE (*)    All other components within normal limits  LIPASE, BLOOD  CBC   ____________________________________________  EKG   ____________________________________________  RADIOLOGY   No results found.  ____________________________________________    PROCEDURES  Procedure(s) performed:    Procedures    Medications  sodium chloride flush (NS) 0.9 % injection 3 mL (has no administration in time range)  alum & mag hydroxide-simeth (MAALOX/MYLANTA) 200-200-20 MG/5ML suspension 30 mL (30 mLs Oral Given 10/06/18 2109)    And  lidocaine (XYLOCAINE) 2 % viscous mouth solution 15 mL (15 mLs Oral Given 10/06/18 2109)     ____________________________________________   INITIAL IMPRESSION / ASSESSMENT AND PLAN / ED COURSE  Pertinent labs & imaging results that were available during my care of the patient were reviewed by me and considered in my medical decision making (see chart for details).  Review of the [place] CSRS was performed in accordance of the [place] prior to dispensing any controlled drugs.   Patient presented to the emergency department for evaluation of mid to upper abdominal discomfort for 3 days.  Vital signs and exam are reassuring.  CBC is unremarkable.  CMP remarkable for creatinine 1.06 and GFR 54.  Urinalysis consistent with urinary tract infection.  Patient was given GI cocktail for epigastric discomfort.  CT scan ordered and is pending.  Report was given to Dr. Joan Mayans for follow-up of CT scan and disposition.    Maria Neal was evaluated in Emergency Department on 10/06/2018 for the symptoms described in the history of present illness. She was evaluated in the context of the global COVID-19 pandemic, which necessitated consideration that the patient might be at risk for infection with the SARS-CoV-2 virus that causes COVID-19. Institutional protocols and  algorithms that pertain to the evaluation of patients at risk for COVID-19 are in a state of rapid change based on information released by regulatory bodies including the CDC and federal and state organizations. These policies and algorithms were followed during the patient's care in the ED.  ____________________________________________  FINAL CLINICAL IMPRESSION(S) / ED DIAGNOSES  Final diagnoses:  None      NEW MEDICATIONS STARTED DURING THIS VISIT:  ED Discharge Orders    None          This chart was dictated using voice recognition software/Dragon. Despite best efforts to proofread, errors can occur which can change the meaning. Any change was purely unintentional.  Laban Emperor, PA-C 10/06/18 2128    Lilia Pro., MD 10/07/18 [phone]

## 2018-10-06 NOTE — ED Notes (Signed)
Patient tolerated po medication well. Instructed not to drink any water for 30 mins.

## 2018-10-06 NOTE — ED Triage Notes (Addendum)
Pt to the er for abd pain across the middle of the abdomen with bloating. Pt denies n/v/d. Pt has seen her PCP about abd pain and is to follow up with GI tomorrow for testing.

## 2018-10-06 NOTE — ED Notes (Signed)
Patient tolerated po medication well. Will continue to monitor.

## 2018-10-07 ENCOUNTER — Ambulatory Visit (INDEPENDENT_AMBULATORY_CARE_PROVIDER_SITE_OTHER): Payer: Medicare Other | Admitting: Gastroenterology

## 2018-10-07 ENCOUNTER — Encounter: Payer: Self-pay | Admitting: Gastroenterology

## 2018-10-07 VITALS — BP 94/55 | HR 71 | Temp 97.6°F | Ht 64.0 in | Wt 196.2 lb

## 2018-10-07 DIAGNOSIS — R1013 Epigastric pain: Secondary | ICD-10-CM

## 2018-10-07 DIAGNOSIS — G8929 Other chronic pain: Secondary | ICD-10-CM | POA: Diagnosis not present

## 2018-10-07 MED ORDER — CYCLOBENZAPRINE HCL 5 MG PO TABS: 5.0000 mg | ORAL_TABLET | Freq: Three times a day (TID) | ORAL | 0 refills | Status: DC | PRN

## 2018-10-07 NOTE — Progress Notes (Signed)
Gastroenterology Consultation  Referring Provider:     Kathrine Haddock, NP Primary Care Physician:  Volney American, PA-C Primary Gastroenterologist:  Dr. Allen Norris     Reason for Consultation:     Abdominal pain        HPI:   Maria Neal is a 68 y.o. y/o female referred for consultation & management of abdominal pain by Dr. Orene Desanctis, Lilia Argue, PA-C.  This patient was seen by her primary care provider back in the beginning of September and was referred for right upper quadrant abdominal pain.  The patient ended up in the emergency room yesterday for 3 days abdominal pain with a normal CBC, slightly elevated creatinine and what appears to be a urinary tract infection.  The patient also underwent a CT scan of the abdomen that showed:  IMPRESSION: 1. Cardiomegaly 2. Slightly heterogeneous posterior right liver parenchyma as on theprior exam. This is nonspecific, could be due to hepatic steatosis. 3. No acute intra-abdominal or pelvic pathology to explain the patient's symptoms.  The patient reports that her abdominal pain is worse when she coughs and sneezes.  The pain has also woken her up in the middle the night.  There is no report of any fevers chills nausea vomiting or black/bloody stools.  The pain is not get better when she moves her bowels or by eating.  Past Medical History:  Diagnosis Date  . AICD (automatic cardioverter/defibrillator) present   . Anxiety   . CAD (coronary artery disease)   . Cardiomyopathy ([place])   . CHF (congestive heart failure) ([place])   . COPD (chronic obstructive pulmonary disease) ([place])   . Depression   . Dyspnea   . Headache   . History of kidney stones   . History of shingles   . Hypertension   . Insomnia   . On home oxygen therapy    bedtime and prn  . Restless leg syndrome     Past Surgical History:  Procedure Laterality Date  . CARDIAC CATHETERIZATION  04/2014   Dr. Idelle Leech  . CARDIAC CATHETERIZATION    . CARDIAC DEFIBRILLATOR  PLACEMENT    . CHOLECYSTECTOMY N/A 08/23/2016   Procedure: LAPAROSCOPIC CHOLECYSTECTOMY;  Surgeon: Clayburn Pert, MD;  Location: [facility] ORS;  Service: General;  Laterality: N/A;  . DILATATION & CURETTAGE/HYSTEROSCOPY WITH MYOSURE N/A 06/27/2018   Procedure: DILATATION & CURETTAGE/HYSTEROSCOPY WITH MYOSURE;  Surgeon: Malachy Mood, MD;  Location: [facility] OR[facility];  Service: Gynecology;  Laterality: N/A;  . HERNIA REPAIR    . LAPAROSCOPIC APPENDECTOMY N/A 02/17/2018   Procedure: APPENDECTOMY LAPAROSCOPIC;  Surgeon: Benjamine Sprague, DO;  Location: [facility];  Service: General;  Laterality: N/A;    Prior to Admission medications   Medication Sig Start Date End Date Taking? Authorizing Provider  albuterol (PROVENTIL HFA;VENTOLIN HFA) 108 (90 Base) MCG/ACT inhaler Inhale 2 puffs into the lungs every 6 (six) hours as needed for wheezing or shortness of breath.    [provider]  aspirin EC 81 MG tablet Take 81 mg by mouth daily.    [provider]  atorvastatin (LIPITOR) 20 MG tablet TAKE 1 TABLET BY MOUTH ONCE DAILY Patient taking differently: Take 20 mg by mouth daily.  05/01/18   Volney American, PA-C  carvedilol (COREG) 25 MG tablet Take 25 mg by mouth 2 (two) times daily. 04/12/17   [provider]  fluticasone (FLONASE) 50 MCG/ACT nasal spray Place 2 sprays into both nostrils daily. 07/26/18   Volney American, PA-C  HYDROcodone-acetaminophen (  NORCO/VICODIN) 5-325 MG tablet Take 1 tablet by mouth every 6 (six) hours as needed. Patient not taking: Reported on 08/23/2018 06/27/18   Malachy Mood, MD  ibuprofen (ADVIL) 800 MG tablet Take 1 tablet (800 mg total) by mouth every 8 (eight) hours as needed for mild pain or moderate pain. 06/27/18   Malachy Mood, MD  losartan (COZAAR) 50 MG tablet Take 50 mg by mouth daily. 04/10/17   [provider]  nystatin cream (MYCOSTATIN) Apply 1 application topically 2 (two) times daily. 05/08/18   Volney American,  PA-C  OXYGEN Inhale 3 L into the lungs 3 (three) times daily as needed (shortness of breath or coughing).    [provider]  pantoprazole (PROTONIX) 40 MG tablet TAKE 1 TABLET BY MOUTH ONCE DAILY 10/05/18   Volney American, PA-C  spironolactone (ALDACTONE) 25 MG tablet Take 12.5 mg by mouth daily. 04/09/17   [provider]  sulfamethoxazole-trimethoprim (BACTRIM DS) 800-160 MG tablet Take 1 tablet by mouth 2 (two) times daily. 10/06/18   Laban Emperor, PA-C    Family History  Problem Relation Age of Onset  . Diabetes Mellitus II Mother   . Hypertension Mother   . Heart attack Father   . Heart disease Brother   . Arthritis Sister   . Depression Daughter   . Depression Son   . Schizophrenia Son   . Arthritis Sister   . Diabetes Brother      Social History   Tobacco Use  . Smoking status: Former Smoker    Packs/day: 0.25    Types: Cigarettes  . Smokeless tobacco: Never Used  . Tobacco comment: quit at age 60, whole pack lasted 3 weeks   Substance Use Topics  . Alcohol use: No    Alcohol/week: 0.0 standard drinks  . Drug use: No    Allergies as of 10/07/2018 - Review Complete 10/06/2018  Allergen Reaction Noted  . Levaquin [levofloxacin] Anaphylaxis 05/03/2014  . Amoxicillin Hives 05/03/2014  . Nitrofuran derivatives Other (See Comments) 05/03/2014  . Penicillins Other (See Comments) 05/03/2014  . Zithromax [azithromycin] Other (See Comments) 05/03/2014  . Antihistamines, chlorpheniramine-type Rash 04/22/2010    Review of Systems:    All systems reviewed and negative except where noted in HPI.   Physical Exam:  There were no vitals taken for this visit. No LMP recorded. Patient is postmenopausal. General:   Alert,  Well-developed, well-nourished, pleasant and cooperative in NAD Head:  Normocephalic and atraumatic. Eyes:  Sclera clear, no icterus.   Conjunctiva pink. Ears:  Normal auditory acuity. Nose:  No deformity, discharge, or lesions.  Mouth:  No deformity or lesions,oropharynx pink & moist. Neck:  Supple; no masses or thyromegaly. Lungs:  Respirations even and unlabored.  Clear throughout to auscultation.   No wheezes, crackles, or rhonchi. No acute distress. Heart:  Regular rate and rhythm; no murmurs, clicks, rubs, or gallops. Abdomen:  Normal bowel sounds.  No bruits.  Soft, positive tenderness to one finger palpation while flexing the abdominal wall muscles by raising the patient's leg 6 inches above the exam table and non-distended without masses, hepatosplenomegaly or hernias noted.  No guarding or rebound tenderness.  Positive Carnett sign.   Rectal:  Deferred.  Msk:  Symmetrical without gross deformities.  Good, equal movement & strength bilaterally. Pulses:  Normal pulses noted. Extremities:  No clubbing or edema.  No cyanosis. Neurologic:  Alert and oriented x3;  grossly normal neurologically. Skin:  Intact without significant lesions or rashes.  No  jaundice. Lymph Nodes:  No significant cervical adenopathy. Psych:  Alert and cooperative. Normal mood and affect.  Imaging Studies: Ct Abdomen Pelvis W Contrast  Result Date: 10/06/2018 CLINICAL DATA:  Abdominal pain, bloating EXAM: CT ABDOMEN AND PELVIS WITH CONTRAST TECHNIQUE: Multidetector CT imaging of the abdomen and pelvis was performed using the standard protocol following bolus administration of intravenous contrast. CONTRAST:  178mL OMNIPAQUE IOHEXOL 300 MG/ML  SOLN COMPARISON:  February 17, 2018 FINDINGS: Lower chest: There is cardiomegaly as on the prior exam. No hiatal hernia. The visualized portions of the lungs are clear. Hepatobiliary: There is a slightly heterogeneous appearance to the posterior right liver lobe. A coarse calcifications seen in the inferior right liver lobe.The main portal vein is patent. The patient is status post cholecystectomy. No biliary ductal dilation. Pancreas: Unremarkable. No pancreatic ductal dilatation or surrounding  inflammatory changes. Spleen: Normal in size without focal abnormality. Adrenals/Urinary Tract: Both adrenal glands appear normal. The kidneys and collecting system appear normal without evidence of urinary tract calculus or hydronephrosis. Bladder is unremarkable. Stomach/Bowel: The stomach, small bowel, and colon are normal in appearance. No inflammatory changes, wall thickening, or obstructive findings. Vascular/Lymphatic: There are no enlarged mesenteric, retroperitoneal, or pelvic lymph nodes. Scattered aortic atherosclerotic calcifications are seen without aneurysmal dilatation. Reproductive: The uterus and adnexa are unremarkable. Other: A small fat containing anterior umbilical hernia seen. Anterior abdominal wall mesh repair material is seen. Musculoskeletal: No acute or significant osseous findings. IMPRESSION: 1. Cardiomegaly 2. Slightly heterogeneous posterior right liver parenchyma as on the prior exam. This is nonspecific, could be due to hepatic steatosis. 3. No acute intra-abdominal or pelvic pathology to explain the patient's symptoms. Electronically Signed   By: Prudencio Pair M.D.   On: 10/06/2018 23:46    Assessment and Plan:   LORRANE MCCAY is a 68 y.o. y/o female who comes in today with a history of abdominal pain that is been intermittent for months.  The patient has clear musculoskeletal pain with reproducible pain with one finger palpation while flexing the abdominal wall muscles.  The patient will be started on Flexeril and warm compresses to the abdominal wall.  The patient's symptoms are not consistent with intestinal pain therefore the patient does not need any endoscopic procedures for the pain.  The patient has never had a colonoscopy and will be set up for a screening colonoscopy. I have discussed risks & benefits which include, but are not limited to, bleeding, infection, perforation & drug reaction.  The patient agrees with this plan & written consent will be obtained.      Lucilla Lame, MD. Marval Regal    Note: This dictation was prepared with Dragon dictation along with smaller phrase technology. Any transcriptional errors that result from this process are unintentional.

## 2018-10-08 LAB — URINE CULTURE: Culture: 70000 — AB

## 2018-10-11 ENCOUNTER — Other Ambulatory Visit: Payer: Self-pay

## 2018-10-11 ENCOUNTER — Ambulatory Visit (INDEPENDENT_AMBULATORY_CARE_PROVIDER_SITE_OTHER): Payer: Medicare Other

## 2018-10-11 DIAGNOSIS — Z23 Encounter for immunization: Secondary | ICD-10-CM

## 2018-10-22 ENCOUNTER — Ambulatory Visit: Payer: Medicare Other | Admitting: Obstetrics and Gynecology

## 2018-10-23 NOTE — Progress Notes (Signed)
MRN : [id number]  Maria Neal is a 68 y.o. (1950/07/13) female who presents with chief complaint of No chief complaint on file. Marland Kitchen  History of Present Illness:   The patient returns to the office for followup evaluation regarding leg swelling.  The swelling has persisted and the pain associated with swelling continues. There have not been any interval development of a ulcerations or wounds.  Since the previous visit the patient has been wearing graduated compression stockings and has noted little if any improvement in the lymphedema. The patient has been using compression routinely morning until night.  The patient also states elevation during the day and exercise is being done too.  Venous ultrasound shows reflux in the left GSV  No outpatient medications have been marked as taking for the 10/24/18 encounter (Appointment) with Delana Meyer, Dolores Lory, MD.    Past Medical History:  Diagnosis Date   AICD (automatic cardioverter/defibrillator) present    Anxiety    CAD (coronary artery disease)    Cardiomyopathy ([place])    CHF (congestive heart failure) ([place])    COPD (chronic obstructive pulmonary disease) ([place])    Depression    Dyspnea    Headache    History of kidney stones    History of shingles    Hypertension    Insomnia    On home oxygen therapy    bedtime and prn   Restless leg syndrome     Past Surgical History:  Procedure Laterality Date   CARDIAC CATHETERIZATION  04/2014   Dr. Idelle Leech   CARDIAC CATHETERIZATION     CARDIAC DEFIBRILLATOR PLACEMENT     CHOLECYSTECTOMY N/A 08/23/2016   Procedure: LAPAROSCOPIC CHOLECYSTECTOMY;  Surgeon: Clayburn Pert, MD;  Location: [facility];  Service: General;  Laterality: N/A;   [place] N/A 06/27/2018   Procedure: DILATATION & CURETTAGE/HYSTEROSCOPY WITH MYOSURE;  Surgeon: Malachy Mood, MD;  Location: [facility];  Service: Gynecology;  Laterality: N/A;   HERNIA REPAIR      LAPAROSCOPIC APPENDECTOMY N/A 02/17/2018   Procedure: APPENDECTOMY LAPAROSCOPIC;  Surgeon: Benjamine Sprague, DO;  Location: [facility];  Service: General;  Laterality: N/A;    Social History Social History   Tobacco Use   Smoking status: Former Smoker    Packs/day: 0.25    Types: Cigarettes   Smokeless tobacco: Never Used   Tobacco comment: quit at age 41, whole pack lasted 3 weeks   Substance Use Topics   Alcohol use: No    Alcohol/week: 0.0 standard drinks   Drug use: No    Family History Family History  Problem Relation Age of Onset   Diabetes Mellitus II Mother    Hypertension Mother    Heart attack Father    Heart disease Brother    Arthritis Sister    Depression Daughter    Depression Son    Schizophrenia Son    Arthritis Sister    Diabetes Brother     Allergies  Allergen Reactions   Levaquin [Levofloxacin] Anaphylaxis   Amoxicillin Hives    Did it involve swelling of the face/tongue/throat, SOB, or low BP? No Did it involve sudden or severe rash/hives, skin peeling, or any reaction on the inside of your mouth or nose? No Did you need to seek medical attention at a hospital or doctor's office? No When did it last happen?10+ years If all above answers are "NO", may proceed with cephalosporin use.   Nitrofuran Derivatives Other (See Comments)    Unknown  Penicillins Other (See Comments)    Thinks it made her itch a lot, but isn't sure. Did it involve swelling of the face/tongue/throat, SOB, or low BP? No Did it involve sudden or severe rash/hives, skin peeling, or any reaction on the inside of your mouth or nose? No Did you need to seek medical attention at a hospital or doctor's office? No When did it last happen?10+ years If all above answers are "NO", may proceed with cephalosporin use.    Zithromax [Azithromycin] Other (See Comments)   Antihistamines, Chlorpheniramine-Type Rash     REVIEW OF SYSTEMS (Negative unless  checked)  Constitutional: [] Weight loss  [] Fever  [] Chills Cardiac: [] Chest pain   [] Chest pressure   [] Palpitations   [] Shortness of breath when laying flat   [] Shortness of breath with exertion. Vascular:  [] Pain in legs with walking   [x] Pain in legs at rest  [] History of DVT   [] Phlebitis   [x] Swelling in legs   [x] Varicose veins   [] Non-healing ulcers Pulmonary:   [] Uses home oxygen   [] Productive cough   [] Hemoptysis   [] Wheeze  [] COPD   [] Asthma Neurologic:  [] Dizziness   [] Seizures   [] History of stroke   [] History of TIA  [] Aphasia   [] Vissual changes   [] Weakness or numbness in arm   [] Weakness or numbness in leg Musculoskeletal:   [] Joint swelling   [] Joint pain   [] Low back pain Hematologic:  [] Easy bruising  [] Easy bleeding   [] Hypercoagulable state   [] Anemic Gastrointestinal:  [] Diarrhea   [] Vomiting  [] Gastroesophageal reflux/heartburn   [] Difficulty swallowing. Genitourinary:  [] Chronic kidney disease   [] Difficult urination  [] Frequent urination   [] Blood in urine Skin:  [] Rashes   [] Ulcers  Psychological:  [] History of anxiety   []  History of major depression.  Physical Examination  There were no vitals filed for this visit. There is no height or weight on file to calculate BMI. Gen: WD/WN, NAD Head: [place]/AT, No temporalis wasting.  Ear/Nose/Throat: Hearing grossly intact, nares w/o erythema or drainage Eyes: PER, EOMI, sclera nonicteric.  Neck: Supple, no large masses.   Pulmonary:  Good air movement, no audible wheezing bilaterally, no use of accessory muscles.  Cardiac: RRR, no JVD Vascular: Large varicosities present extensively greater than 10 mm left.  Moderate venous stasis changes to the legs bilaterally.  2+ soft pitting edema Vessel Right Left  Radial Palpable Palpable  PT Palpable Palpable  DP Palpable Palpable  Gastrointestinal: Non-distended. No guarding/no peritoneal signs.  Musculoskeletal: M/S 5/5 throughout.  No deformity or atrophy.  Neurologic: CN  2-12 intact. Symmetrical.  Speech is fluent. Motor exam as listed above. Psychiatric: Judgment intact, Mood & affect appropriate for pt's clinical situation. Dermatologic: Moderate venous rashes no ulcers noted.  No changes consistent with cellulitis. Lymph : No lichenification or skin changes of chronic lymphedema.  CBC Lab Results  Component Value Date   WBC 7.7 10/06/2018   HGB 12.7 10/06/2018   HCT 38.8 10/06/2018   MCV 89.8 10/06/2018   PLT 180 10/06/2018    BMET    Component Value Date/Time   NA 140 10/06/2018 1737   NA 146 (H) 09/05/2018 1404   NA 139 12/07/2013 1207   K 4.9 10/06/2018 1737   K 3.8 12/07/2013 1207   CL 102 10/06/2018 1737   CL 99 12/07/2013 1207   CO2 29 10/06/2018 1737   CO2 31 12/07/2013 1207   GLUCOSE 78 10/06/2018 1737   GLUCOSE 97 12/07/2013 1207   BUN 22 10/06/2018  1737   BUN 30 (H) 09/05/2018 1404   BUN 8 12/07/2013 1207   CREATININE 1.06 (H) 10/06/2018 1737   CREATININE 0.77 12/07/2013 1207   CALCIUM 9.5 10/06/2018 1737   CALCIUM 8.7 12/07/2013 1207   GFRNONAA 54 (L) 10/06/2018 1737   GFRNONAA >60 12/07/2013 1207   GFRNONAA >60 09/21/2013 1133   GFRAA >60 10/06/2018 1737   GFRAA >60 12/07/2013 1207   GFRAA >60 09/21/2013 1133   CrCl cannot be calculated (Unknown ideal weight.).  COAG No results found for: INR, PROTIME  Radiology Ct Abdomen Pelvis W Contrast  Result Date: 10/06/2018 CLINICAL DATA:  Abdominal pain, bloating EXAM: CT ABDOMEN AND PELVIS WITH CONTRAST TECHNIQUE: Multidetector CT imaging of the abdomen and pelvis was performed using the standard protocol following bolus administration of intravenous contrast. CONTRAST:  175mL OMNIPAQUE IOHEXOL 300 MG/ML  SOLN COMPARISON:  February 17, 2018 FINDINGS: Lower chest: There is cardiomegaly as on the prior exam. No hiatal hernia. The visualized portions of the lungs are clear. Hepatobiliary: There is a slightly heterogeneous appearance to the posterior right liver lobe. A coarse  calcifications seen in the inferior right liver lobe.The main portal vein is patent. The patient is status post cholecystectomy. No biliary ductal dilation. Pancreas: Unremarkable. No pancreatic ductal dilatation or surrounding inflammatory changes. Spleen: Normal in size without focal abnormality. Adrenals/Urinary Tract: Both adrenal glands appear normal. The kidneys and collecting system appear normal without evidence of urinary tract calculus or hydronephrosis. Bladder is unremarkable. Stomach/Bowel: The stomach, small bowel, and colon are normal in appearance. No inflammatory changes, wall thickening, or obstructive findings. Vascular/Lymphatic: There are no enlarged mesenteric, retroperitoneal, or pelvic lymph nodes. Scattered aortic atherosclerotic calcifications are seen without aneurysmal dilatation. Reproductive: The uterus and adnexa are unremarkable. Other: A small fat containing anterior umbilical hernia seen. Anterior abdominal wall mesh repair material is seen. Musculoskeletal: No acute or significant osseous findings. IMPRESSION: 1. Cardiomegaly 2. Slightly heterogeneous posterior right liver parenchyma as on the prior exam. This is nonspecific, could be due to hepatic steatosis. 3. No acute intra-abdominal or pelvic pathology to explain the patient's symptoms. Electronically Signed   By: Prudencio Pair M.D.   On: 10/06/2018 23:46     Assessment/Plan 1. Varicose veins of both lower extremities with inflammation Recommend  I have reviewed my previous  discussion with the patient regarding  varicose veins and why they cause symptoms. Patient will continue  wearing graduated compression stockings class 1 on a daily basis, beginning first thing in the morning and removing them in the evening.    In addition, behavioral modification including elevation during the day was again discussed and this will continue.  The patient has utilized over the counter pain medications and has been  exercising.  However, at this time conservative therapy has not alleviated the patient's symptoms of leg pain and swelling  Recommend: laser ablation of the left great saphenous vein to eliminate the symptoms of pain and swelling of the lower extremities caused by the severe superficial venous reflux disease.   2. Chronic venous insufficiency No surgery or intervention at this point in time.    I have had a long discussion with the patient regarding venous insufficiency and why it  causes symptoms. I have discussed with the patient the chronic skin changes that accompany venous insufficiency and the long term sequela such as infection and ulceration.  Patient will begin wearing graduated compression stockings class 1 (20-30 mmHg) or compression wraps on a daily basis a prescription was  given. The patient will put the stockings on first thing in the morning and removing them in the evening. The patient is instructed specifically not to sleep in the stockings.    In addition, behavioral modification including several periods of elevation of the lower extremities during the day will be continued. I have demonstrated that proper elevation is a position with the ankles at heart level.   3. Coronary artery disease of native artery of native heart with stable angina pectoris (HCC) Continue cardiac and antihypertensive medications as already ordered and reviewed, no changes at this time.  Continue statin as ordered and reviewed, no changes at this time  Nitrates PRN for chest pain   4. Benign essential HTN Continue antihypertensive medications as already ordered, these medications have been reviewed and there are no changes at this time.   5. Chronic obstructive pulmonary disease, unspecified COPD type ([place]) Continue pulmonary medications and aerosols as already ordered, these medications have been reviewed and there are no changes at this time.    Hortencia Pilar, MD  10/23/2018 8:39 PM

## 2018-10-24 ENCOUNTER — Other Ambulatory Visit: Payer: Self-pay

## 2018-10-24 ENCOUNTER — Ambulatory Visit (INDEPENDENT_AMBULATORY_CARE_PROVIDER_SITE_OTHER): Payer: Medicare Other | Admitting: Vascular Surgery

## 2018-10-24 ENCOUNTER — Ambulatory Visit (INDEPENDENT_AMBULATORY_CARE_PROVIDER_SITE_OTHER): Payer: Medicare Other

## 2018-10-24 ENCOUNTER — Encounter (INDEPENDENT_AMBULATORY_CARE_PROVIDER_SITE_OTHER): Payer: Self-pay | Admitting: Vascular Surgery

## 2018-10-24 VITALS — BP 96/61 | HR 83 | Resp 16 | Wt 197.2 lb

## 2018-10-24 DIAGNOSIS — I8311 Varicose veins of right lower extremity with inflammation: Secondary | ICD-10-CM | POA: Diagnosis not present

## 2018-10-24 DIAGNOSIS — I8312 Varicose veins of left lower extremity with inflammation: Secondary | ICD-10-CM | POA: Diagnosis not present

## 2018-10-24 DIAGNOSIS — M79604 Pain in right leg: Secondary | ICD-10-CM

## 2018-10-24 DIAGNOSIS — I25118 Atherosclerotic heart disease of native coronary artery with other forms of angina pectoris: Secondary | ICD-10-CM | POA: Diagnosis not present

## 2018-10-24 DIAGNOSIS — I872 Venous insufficiency (chronic) (peripheral): Secondary | ICD-10-CM | POA: Diagnosis not present

## 2018-10-24 DIAGNOSIS — I10 Essential (primary) hypertension: Secondary | ICD-10-CM

## 2018-10-24 DIAGNOSIS — M79605 Pain in left leg: Secondary | ICD-10-CM | POA: Diagnosis not present

## 2018-10-24 DIAGNOSIS — J449 Chronic obstructive pulmonary disease, unspecified: Secondary | ICD-10-CM

## 2018-10-25 ENCOUNTER — Encounter (INDEPENDENT_AMBULATORY_CARE_PROVIDER_SITE_OTHER): Payer: Self-pay | Admitting: Vascular Surgery

## 2018-11-15 ENCOUNTER — Other Ambulatory Visit: Payer: Self-pay | Admitting: Family Medicine

## 2018-12-05 ENCOUNTER — Other Ambulatory Visit (INDEPENDENT_AMBULATORY_CARE_PROVIDER_SITE_OTHER): Payer: Medicare Other | Admitting: Vascular Surgery

## 2018-12-09 ENCOUNTER — Ambulatory Visit (INDEPENDENT_AMBULATORY_CARE_PROVIDER_SITE_OTHER): Payer: Medicare Other | Admitting: Family Medicine

## 2018-12-09 ENCOUNTER — Other Ambulatory Visit: Payer: Self-pay

## 2018-12-09 ENCOUNTER — Encounter (INDEPENDENT_AMBULATORY_CARE_PROVIDER_SITE_OTHER): Payer: Medicare Other

## 2018-12-09 ENCOUNTER — Encounter: Payer: Self-pay | Admitting: Family Medicine

## 2018-12-09 VITALS — Temp 94.0°F | Wt 189.0 lb

## 2018-12-09 DIAGNOSIS — J029 Acute pharyngitis, unspecified: Secondary | ICD-10-CM

## 2018-12-09 NOTE — Progress Notes (Signed)
Temp (!) 94 F (34.4 C) (Oral)   Wt 189 lb (85.7 kg)   BMI 32.44 kg/m    Subjective:    Patient ID: Maria Neal, female    DOB: 20-Oct-1950, 68 y.o.   MRN: [id number]  HPI: Maria Neal is a 68 y.o. female  Chief Complaint  Patient presents with  . Sore Throat    x 4 days. taking OTC alka seltzer    . This visit was completed via telephone due to the restrictions of the COVID-19 pandemic. All issues as above were discussed and addressed. Physical exam was done as above through visual confirmation on telephone. If it was felt that the patient should be evaluated in the office, they were directed there. The patient verbally consented to this visit. . Location of the patient: home . Location of the provider: work . Those involved with this call:  . Provider: Merrie Roof, PA-C . CMA: Lesle Chris, [place] . Front Desk/Registration: Jill Side  . Time spent on call: 15 minutes on the phone discussing health concerns. 5 minutes total spent in review of patient's record and preparation of their chart. I verified patient identity using two factors (patient name and date of birth). Patient consents verbally to being seen via telemedicine visit today.   About 4 days of rhinorrhea, post nasal drip, sore throat. Gargling warm salt water and taking alka seltzer plus with minimal relief. Denies sick contacts, recent travel, fever, chills, body aches, CP, SOB, cough. States she thinks getting her head wet the other day gave her a cold.   Relevant past medical, surgical, family and social history reviewed and updated as indicated. Interim medical history since our last visit reviewed. Allergies and medications reviewed and updated.  Review of Systems  Per HPI unless specifically indicated above     Objective:    Temp (!) 94 F (34.4 C) (Oral)   Wt 189 lb (85.7 kg)   BMI 32.44 kg/m   Wt Readings from Last 3 Encounters:  12/09/18 189 lb (85.7 kg)  10/24/18 197 lb 3.2 oz (89.4 kg)   10/07/18 196 lb 3.2 oz (89 kg)    Physical Exam  Unable to perform PE due to patient lack of access to video technology for today's visit  Results for orders placed or performed during the hospital encounter of 10/06/18  Urine Culture   Specimen: Urine, Random  Result Value Ref Range   Specimen Description      URINE, RANDOM Performed at [facility], [address], [place], [place] [address]    Special Requests      NONE Performed at [facility], [place], [place] [address]    Culture 70,000 COLONIES/mL ESCHERICHIA COLI (A)    Report Status 10/08/2018 FINAL    Organism ID, Bacteria ESCHERICHIA COLI (A)       Susceptibility   Escherichia coli - MIC*    AMPICILLIN <=2 SENSITIVE Sensitive     CEFAZOLIN <=4 SENSITIVE Sensitive     CEFTRIAXONE <=1 SENSITIVE Sensitive     CIPROFLOXACIN <=0.25 SENSITIVE Sensitive     GENTAMICIN <=1 SENSITIVE Sensitive     IMIPENEM <=0.25 SENSITIVE Sensitive     NITROFURANTOIN <=16 SENSITIVE Sensitive     TRIMETH/SULFA <=20 SENSITIVE Sensitive     AMPICILLIN/SULBACTAM <=2 SENSITIVE Sensitive     PIP/TAZO <=4 SENSITIVE Sensitive     Extended ESBL NEGATIVE Sensitive     * 70,000 COLONIES/mL ESCHERICHIA COLI  Lipase, blood  Result Value Ref Range   Lipase 35 11 - 51 U/L  Comprehensive metabolic panel  Result Value Ref Range   Sodium 140 135 - 145 mmol/L   Potassium 4.9 3.5 - 5.1 mmol/L   Chloride 102 98 - 111 mmol/L   CO2 29 22 - 32 mmol/L   Glucose, Bld 78 70 - 99 mg/dL   BUN 22 8 - 23 mg/dL   Creatinine, Ser 1.06 (H) 0.44 - 1.00 mg/dL   Calcium 9.5 8.9 - 10.3 mg/dL   Total Protein 7.5 6.5 - 8.1 g/dL   Albumin 4.3 3.5 - 5.0 g/dL   AST 22 15 - 41 U/L   ALT 16 0 - 44 U/L   Alkaline Phosphatase 72 38 - 126 U/L   Total Bilirubin 0.9 0.3 - 1.2 mg/dL   GFR calc non Af Amer 54 (L) >60 mL/min   GFR calc Af Amer >60 >60 mL/min   Anion gap 9 5 - 15  CBC  Result Value Ref Range   WBC 7.7 4.0 - 10.5 K/uL    RBC 4.32 3.87 - 5.11 MIL/uL   Hemoglobin 12.7 12.0 - 15.0 g/dL   HCT 38.8 36.0 - 46.0 %   MCV 89.8 80.0 - 100.0 fL   MCH 29.4 26.0 - 34.0 pg   MCHC 32.7 30.0 - 36.0 g/dL   RDW 13.9 11.5 - 15.5 %   Platelets 180 150 - 400 K/uL   nRBC 0.0 0.0 - 0.2 %  Urinalysis, Complete w Microscopic  Result Value Ref Range   Color, Urine YELLOW (A) YELLOW   APPearance HAZY (A) CLEAR   Specific Gravity, Urine 1.012 1.005 - 1.030   pH 6.0 5.0 - 8.0   Glucose, UA NEGATIVE NEGATIVE mg/dL   Hgb urine dipstick NEGATIVE NEGATIVE   Bilirubin Urine NEGATIVE NEGATIVE   Ketones, ur NEGATIVE NEGATIVE mg/dL   Protein, ur NEGATIVE NEGATIVE mg/dL   Nitrite NEGATIVE NEGATIVE   Leukocytes,Ua TRACE (A) NEGATIVE   RBC / HPF 0-5 0 - 5 RBC/hpf   WBC, UA 6-10 0 - 5 WBC/hpf   Bacteria, UA RARE (A) NONE SEEN   Squamous Epithelial / LPF 6-10 0 - 5   Mucus PRESENT    Hyaline Casts, UA PRESENT       Assessment & Plan:   Problem List Items Addressed This Visit    None    Visit Diagnoses    Sore throat    -  Primary   Will refer for COVID testing, tx with flonase, antihistamines, continued OTC supportive care. F/u if not improving. Quarantine until results and sxs improve   Relevant Orders   Novel Coronavirus, NAA ([facility])       Follow up plan: Return if symptoms worsen or fail to improve.

## 2018-12-11 ENCOUNTER — Other Ambulatory Visit: Payer: Self-pay

## 2018-12-11 DIAGNOSIS — Z20822 Contact with and (suspected) exposure to covid-19: Secondary | ICD-10-CM

## 2018-12-13 LAB — NOVEL CORONAVIRUS, NAA: SARS-CoV-2, NAA: NOT DETECTED

## 2018-12-17 ENCOUNTER — Emergency Department: Payer: Medicare Other

## 2018-12-17 ENCOUNTER — Telehealth: Payer: Self-pay

## 2018-12-17 ENCOUNTER — Other Ambulatory Visit: Payer: Self-pay

## 2018-12-17 ENCOUNTER — Telehealth (INDEPENDENT_AMBULATORY_CARE_PROVIDER_SITE_OTHER): Payer: Self-pay | Admitting: Vascular Surgery

## 2018-12-17 ENCOUNTER — Emergency Department
Admission: EM | Admit: 2018-12-17 | Discharge: 2018-12-17 | Disposition: A | Discharge status: Home / Self Care | Payer: Medicare Other | Attending: Student in an Organized Health Care Education/Training Program | Admitting: Student in an Organized Health Care Education/Training Program

## 2018-12-17 ENCOUNTER — Ambulatory Visit: Payer: Self-pay

## 2018-12-17 DIAGNOSIS — I259 Chronic ischemic heart disease, unspecified: Secondary | ICD-10-CM | POA: Insufficient documentation

## 2018-12-17 DIAGNOSIS — J449 Chronic obstructive pulmonary disease, unspecified: Secondary | ICD-10-CM | POA: Insufficient documentation

## 2018-12-17 DIAGNOSIS — I110 Hypertensive heart disease with heart failure: Secondary | ICD-10-CM | POA: Insufficient documentation

## 2018-12-17 DIAGNOSIS — Z79899 Other long term (current) drug therapy: Secondary | ICD-10-CM | POA: Insufficient documentation

## 2018-12-17 DIAGNOSIS — Z7982 Long term (current) use of aspirin: Secondary | ICD-10-CM | POA: Diagnosis not present

## 2018-12-17 DIAGNOSIS — Z20828 Contact with and (suspected) exposure to other viral communicable diseases: Secondary | ICD-10-CM | POA: Diagnosis not present

## 2018-12-17 DIAGNOSIS — Z87891 Personal history of nicotine dependence: Secondary | ICD-10-CM | POA: Diagnosis not present

## 2018-12-17 DIAGNOSIS — E119 Type 2 diabetes mellitus without complications: Secondary | ICD-10-CM | POA: Insufficient documentation

## 2018-12-17 DIAGNOSIS — R1013 Epigastric pain: Secondary | ICD-10-CM | POA: Diagnosis not present

## 2018-12-17 DIAGNOSIS — Z95810 Presence of automatic (implantable) cardiac defibrillator: Secondary | ICD-10-CM | POA: Diagnosis not present

## 2018-12-17 DIAGNOSIS — I509 Heart failure, unspecified: Secondary | ICD-10-CM

## 2018-12-17 DIAGNOSIS — I5022 Chronic systolic (congestive) heart failure: Secondary | ICD-10-CM | POA: Diagnosis not present

## 2018-12-17 LAB — LIPASE, BLOOD: Lipase: 26 U/L (ref 11–51)

## 2018-12-17 LAB — COMPREHENSIVE METABOLIC PANEL
ALT: 11 U/L (ref 0–44)
AST: 18 U/L (ref 15–41)
Albumin: 4 g/dL (ref 3.5–5.0)
Alkaline Phosphatase: 68 U/L (ref 38–126)
Anion gap: 11 (ref 5–15)
BUN: 18 mg/dL (ref 8–23)
CO2: 28 mmol/L (ref 22–32)
Calcium: 9 mg/dL (ref 8.9–10.3)
Chloride: 101 mmol/L (ref 98–111)
Creatinine, Ser: 0.84 mg/dL (ref 0.44–1.00)
GFR calc Af Amer: >60 mL/min (ref >60)
GFR calc non Af Amer: >60 mL/min (ref >60)
Glucose, Bld: 207 mg/dL — ABNORMAL HIGH (ref 70–99)
Potassium: 4.1 mmol/L (ref 3.5–5.1)
Sodium: 140 mmol/L (ref 135–145)
Total Bilirubin: 1.3 mg/dL — ABNORMAL HIGH (ref 0.3–1.2)
Total Protein: 7.2 g/dL (ref 6.5–8.1)

## 2018-12-17 LAB — CBC
HCT: 38.8 % (ref 36.0–46.0)
Hemoglobin: 12.6 g/dL (ref 12.0–15.0)
MCH: 29.1 pg (ref 26.0–34.0)
MCHC: 32.5 g/dL (ref 30.0–36.0)
MCV: 89.6 fL (ref 80.0–100.0)
Platelets: 188 10*3/uL (ref 150–400)
RBC: 4.33 MIL/uL (ref 3.87–5.11)
RDW: 15.6 % — ABNORMAL HIGH (ref 11.5–15.5)
WBC: 5.8 10*3/uL (ref 4.0–10.5)
nRBC: 0 % (ref 0.0–0.2)

## 2018-12-17 LAB — BRAIN NATRIURETIC PEPTIDE: B Natriuretic Peptide: 1620 pg/mL — ABNORMAL HIGH (ref 0.0–100.0)

## 2018-12-17 LAB — TROPONIN I (HIGH SENSITIVITY)
Troponin I (High Sensitivity): 11 ng/L (ref <18)
Troponin I (High Sensitivity): 12 ng/L (ref <18)

## 2018-12-17 MED ORDER — METHYLPREDNISOLONE SODIUM SUCC 125 MG IJ SOLR: 125.0000 mg | Freq: Once | INTRAMUSCULAR | Status: DC

## 2018-12-17 MED ORDER — ALBUTEROL SULFATE (2.5 MG/3ML) 0.083% IN NEBU: 2.5000 mg | INHALATION_SOLUTION | Freq: Once | RESPIRATORY_TRACT | Status: DC

## 2018-12-17 MED ORDER — FUROSEMIDE 10 MG/ML IJ SOLN
60.0000 mg | Freq: Once | INTRAMUSCULAR | Status: AC
  Administered 2018-12-17: 60 mg via INTRAVENOUS
  Filled 2018-12-17 (×3): qty 6

## 2018-12-17 MED ORDER — SODIUM CHLORIDE 0.9 % IV SOLN: 2.0000 g | Freq: Once | INTRAVENOUS | Status: DC

## 2018-12-17 MED ORDER — SODIUM CHLORIDE 0.9% FLUSH: 3.0000 mL | Freq: Once | INTRAVENOUS | Status: DC

## 2018-12-17 MED ORDER — VANCOMYCIN HCL 10 G IV SOLR
1750.0000 mg | Freq: Once | INTRAVENOUS | Status: DC
  Filled 2018-12-17: qty 1750

## 2018-12-17 NOTE — ED Notes (Signed)
ED Provider at bedside. 

## 2018-12-17 NOTE — ED Notes (Signed)
Pt ambulatory to bathroom with steady gait.  NAD noted.

## 2018-12-17 NOTE — Telephone Encounter (Signed)
Caller given negative result and verbalized understanding  

## 2018-12-17 NOTE — ED Notes (Signed)
Called lab and per Elmyra Ricks they can add on labs to pt's blood in lab

## 2018-12-17 NOTE — Telephone Encounter (Signed)
Pt c/o chest pain center of chest and SOB with activity. Pt stated onset was 3 days ago. Pt sated pain is moderate and that it comes and goes with episodes lasting 30 minutes. Pt with many heart issues and has COPD. Pt thinks it could be a hiatal hernia. Pt stated that chest pain is worse with activity.  Care advice given and pt advised to go to hospital. Pt stated she was at the hospital and will go to ED to be evaluated.  Pt verbalized understanding.  Reason for Disposition . [1] Chest pain lasts > 5 minutes AND [2] occurred in past 3 days (72 hours)  Answer Assessment - Initial Assessment Questions 1. LOCATION: "Where does it hurt?"       Between breast 2. RADIATION: "Does the pain go anywhere else?" (e.g., into neck, jaw, arms, back)     no 3. ONSET: "When did the chest pain begin?" (Minutes, hours or days)      2 days ago 4. PATTERN "Does the pain come and go, or has it been constant since it started?"  "Does it get worse with exertion?"      Comes and goes- no 5. DURATION: "How long does it last" (e.g., seconds, minutes, hours)     30 minutes 6. SEVERITY: "How bad is the pain?"  (e.g., Scale 1-10; mild, moderate, or severe)    - MILD (1-3): doesn't interfere with normal activities     - MODERATE (4-7): interferes with normal activities or awakens from sleep    - SEVERE (8-10): excruciating pain, unable to do any normal activities       moderate 7. CARDIAC RISK FACTORS: "Do you have any history of heart problems or risk factors for heart disease?" (e.g., angina, prior heart attack; diabetes, high blood pressure, high cholesterol, smoker, or strong family history of heart disease)     Heart and Lung problems, HTN 8. PULMONARY RISK FACTORS: "Do you have any history of lung disease?"  (e.g., blood clots in lung, asthma, emphysema, birth control pills)     COPD  9. CAUSE: "What do you think is causing the chest pain?"     Hiatal hernia 10. OTHER SYMPTOMS: "Do you have any other  symptoms?" (e.g., dizziness, nausea, vomiting, sweating, fever, difficulty breathing, cough)       SOB with activity, pain is worse with activity 11. PREGNANCY: "Is there any chance you are pregnant?" "When was your last menstrual period?"       n/a  Protocols used: CHEST PAIN-A-AH

## 2018-12-17 NOTE — Discharge Instructions (Addendum)
Call Dr. Saralyn Pilar clinic tomorrow morning for close follow up to make sure the lasix is working.  Return if you feel like the lasix is no longer helping or you have trouble getting into clinic.

## 2018-12-17 NOTE — Progress Notes (Signed)
PHARMACY -  BRIEF ANTIBIOTIC NOTE   Pharmacy has received consult(s) for vancomycin from an ED provider.  The patient's profile has been reviewed for ht/wt/allergies/indication/available labs.    One time order(s) placed for vanc 1.75g IV load  Further antibiotics/pharmacy consults should be ordered by admitting physician if indicated.                       Thank you,  Tobie Lords, PharmD, BCPS Clinical Pharmacist 12/17/2018  10:08 PM

## 2018-12-17 NOTE — ED Triage Notes (Signed)
Pt comes via POV from home with c/o epigastric pain that started 2 days ago .  Pt states she has had some SOB also. Pt states she called her PCP and was informed to come to ED.  Pt states she was checked last week for COVID and she was negative.  Pt wears 3L chronically. Pt states a little cough.

## 2018-12-17 NOTE — ED Provider Notes (Signed)
[facility] Emergency Department Provider Note    First MD Initiated Contact with Patient 12/17/18 1921     (approximate)  I have reviewed the triage vital signs and the nursing notes.   HISTORY  Chief Complaint Abdominal Pain    HPI Maria Neal is a 68 y.o. female below listed past medical history presents the ER for evaluation of epigastric twisting discomfort that is been remittent for the past several weeks but became increasingly worse today.  Has also had some mild shortness of breath but states its not significantly worse than her usual.  She does wear 3 L nasal cannula at home.  States she is been compliant with her CHF as well as COPD medication.  Is not having any fevers.  Just had recent Covid test which was negative.  Is not taking anything for the pain.    Past Medical History:  Diagnosis Date  . AICD (automatic cardioverter/defibrillator) present   . Anxiety   . CAD (coronary artery disease)   . Cardiomyopathy ([place])   . CHF (congestive heart failure) ([place])   . COPD (chronic obstructive pulmonary disease) ([place])   . Depression   . Dyspnea   . Headache   . History of kidney stones   . History of shingles   . Hypertension   . Insomnia   . On home oxygen therapy    bedtime and prn  . Restless leg syndrome    Family History  Problem Relation Age of Onset  . Diabetes Mellitus II Mother   . Hypertension Mother   . Heart attack Father   . Heart disease Brother   . Arthritis Sister   . Depression Daughter   . Depression Son   . Schizophrenia Son   . Arthritis Sister   . Diabetes Brother    Past Surgical History:  Procedure Laterality Date  . CARDIAC CATHETERIZATION  04/2014   Dr. Idelle Leech  . CARDIAC CATHETERIZATION    . CARDIAC DEFIBRILLATOR PLACEMENT    . CHOLECYSTECTOMY N/A 08/23/2016   Procedure: LAPAROSCOPIC CHOLECYSTECTOMY;  Surgeon: Clayburn Pert, MD;  Location: [facility] ORS;  Service: General;  Laterality: N/A;  .  DILATATION & CURETTAGE/HYSTEROSCOPY WITH MYOSURE N/A 06/27/2018   Procedure: DILATATION & CURETTAGE/HYSTEROSCOPY WITH MYOSURE;  Surgeon: Malachy Mood, MD;  Location: [facility] ORS;  Service: Gynecology;  Laterality: N/A;  . HERNIA REPAIR    . LAPAROSCOPIC APPENDECTOMY N/A 02/17/2018   Procedure: APPENDECTOMY LAPAROSCOPIC;  Surgeon: Benjamine Sprague, DO;  Location: [facility] ORS;  Service: General;  Laterality: N/A;   Patient Active Problem List   Diagnosis Date Noted  . Leg pain 07/23/2018  . 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/23/2018  . Chronic venous insufficiency 07/23/2018  . Simple endometrial hyperplasia without atypia 07/04/2018  . Epigastric pain 06/28/2018  . Acute appendicitis 02/17/2018  . Cervical radiculitis 05/17/2017  . Hypercholesteremia 05/02/2017  . Obesity (BMI 30-39.9) 05/02/2017  . Mitral valve insufficiency 04/16/2017  . Closed fracture of lateral malleolus 03/08/2017  . Chronic systolic congestive heart failure, NYHA class 3 ([place]) 08/07/2016  . NICM (nonischemic cardiomyopathy) ([place]) 08/07/2016  . Bundle branch block, left 10/02/2014  . Benign essential HTN 05/07/2014  . CAD (coronary artery disease) 05/07/2014  . COPD (chronic obstructive pulmonary disease) ([place]) 05/07/2014  . S/P cardiac catheterization 04/23/2014  . SOB (shortness of breath) on exertion 11/06/2013      Prior to Admission medications   Medication Sig Start Date End Date Taking? Authorizing Provider  albuterol (PROVENTIL HFA;VENTOLIN HFA) 108 (90 Base) MCG/ACT inhaler Inhale 2 puffs into the lungs every 6 (six) hours as needed for wheezing or shortness of breath.    [provider]  aspirin EC 81 MG tablet Take 81 mg by mouth daily.    [provider]  atorvastatin (LIPITOR) 20 MG tablet TAKE 1 TABLET BY MOUTH ONCE DAILY Patient taking differently: Take 20 mg by mouth daily.  05/01/18   Volney American, PA-C  carvedilol (COREG) 25 MG tablet Take 25  mg by mouth 2 (two) times daily. 04/12/17   [provider]  cyclobenzaprine (FLEXERIL) 5 MG tablet Take 1 tablet (5 mg total) by mouth 3 (three) times daily as needed for muscle spasms. 10/07/18   Lucilla Lame, MD  fluticasone (FLONASE) 50 MCG/ACT nasal spray Place 2 sprays into both nostrils daily. 07/26/18   Volney American, PA-C  HYDROcodone-acetaminophen (NORCO/VICODIN) 5-325 MG tablet Take 1 tablet by mouth every 6 (six) hours as needed. Patient not taking: Reported on 08/23/2018 06/27/18   Malachy Mood, MD  ibuprofen (ADVIL) 800 MG tablet Take 1 tablet (800 mg total) by mouth every 8 (eight) hours as needed for mild pain or moderate pain. 06/27/18   Malachy Mood, MD  losartan (COZAAR) 50 MG tablet Take 50 mg by mouth daily. 04/10/17   [provider]  nystatin cream (MYCOSTATIN) Apply 1 application topically 2 (two) times daily. 05/08/18   Volney American, PA-C  OXYGEN Inhale 3 L into the lungs 3 (three) times daily as needed (shortness of breath or coughing).    [provider]  pantoprazole (PROTONIX) 40 MG tablet TAKE 1 TABLET BY MOUTH ONCE DAILY 11/15/18   Volney American, PA-C  spironolactone (ALDACTONE) 25 MG tablet Take 12.5 mg by mouth daily. 04/09/17   [provider]    Allergies Levaquin [levofloxacin]; Amoxicillin; Nitrofuran derivatives; Penicillins; Zithromax [azithromycin]; and Antihistamines, chlorpheniramine-type    Social History Social History   Tobacco Use  . Smoking status: Former Smoker    Packs/day: 0.25    Types: Cigarettes  . Smokeless tobacco: Never Used  . Tobacco comment: quit at age 53, whole pack lasted 3 weeks   Substance Use Topics  . Alcohol use: No    Alcohol/week: 0.0 standard drinks  . Drug use: No    Review of Systems Patient denies headaches, rhinorrhea, blurry vision, numbness, shortness of breath, chest pain, edema, cough, abdominal pain, nausea, vomiting, diarrhea, dysuria, fevers,  rashes or hallucinations unless otherwise stated above in HPI. ____________________________________________   PHYSICAL EXAM:  VITAL SIGNS: Vitals:   12/17/18 1820  BP: 104/69  Pulse: 88  Resp: 18  Temp: 98.1 F (36.7 C)  SpO2: 100%    Constitutional: Alert and oriented.  Eyes: Conjunctivae are normal.  Head: Atraumatic. Nose: No congestion/rhinnorhea. Mouth/Throat: Mucous membranes are moist.   Neck: No stridor. Painless ROM.  Cardiovascular: Normal rate, regular rhythm. Grossly normal heart sounds.  Good peripheral circulation. Respiratory: Normal respiratory effort.  No retractions. Lungs with coarse bibasilar breathsounds Gastrointestinal: Soft, mild epigastric ttp, exam somewhat limited 2/2 obesity No distention. No abdominal bruits. No CVA tenderness. Genitourinary:  Musculoskeletal: trace lower extremity edema.  No joint effusions. Neurologic:  Normal speech and language. No gross focal neurologic deficits are appreciated. No facial droop Skin:  Skin is warm, dry and intact. No rash noted. Psychiatric: Mood and affect are normal. Speech and behavior are normal.  ____________________________________________   LABS (all labs ordered are listed, but only  abnormal results are displayed)  Results for orders placed or performed during the hospital encounter of 12/17/18 (from the past 24 hour(s))  Lipase, blood     Status: None   Collection Time: 12/17/18  6:22 PM  Result Value Ref Range   Lipase 26 11 - 51 U/L  Comprehensive metabolic panel     Status: Abnormal   Collection Time: 12/17/18  6:22 PM  Result Value Ref Range   Sodium 140 135 - 145 mmol/L   Potassium 4.1 3.5 - 5.1 mmol/L   Chloride 101 98 - 111 mmol/L   CO2 28 22 - 32 mmol/L   Glucose, Bld 207 (H) 70 - 99 mg/dL   BUN 18 8 - 23 mg/dL   Creatinine, Ser 0.84 0.44 - 1.00 mg/dL   Calcium 9.0 8.9 - 10.3 mg/dL   Total Protein 7.2 6.5 - 8.1 g/dL   Albumin 4.0 3.5 - 5.0 g/dL   AST 18 15 - 41 U/L   ALT 11 0 -  44 U/L   Alkaline Phosphatase 68 38 - 126 U/L   Total Bilirubin 1.3 (H) 0.3 - 1.2 mg/dL   GFR calc non Af Amer >60 >60 mL/min   GFR calc Af Amer >60 >60 mL/min   Anion gap 11 5 - 15  CBC     Status: Abnormal   Collection Time: 12/17/18  6:22 PM  Result Value Ref Range   WBC 5.8 4.0 - 10.5 K/uL   RBC 4.33 3.87 - 5.11 MIL/uL   Hemoglobin 12.6 12.0 - 15.0 g/dL   HCT 38.8 36.0 - 46.0 %   MCV 89.6 80.0 - 100.0 fL   MCH 29.1 26.0 - 34.0 pg   MCHC 32.5 30.0 - 36.0 g/dL   RDW 15.6 (H) 11.5 - 15.5 %   Platelets 188 150 - 400 K/uL   nRBC 0.0 0.0 - 0.2 %  Troponin I (High Sensitivity)     Status: None   Collection Time: 12/17/18  6:22 PM  Result Value Ref Range   Troponin I (High Sensitivity) 11 <18 ng/L  Brain natriuretic peptide     Status: Abnormal   Collection Time: 12/17/18  6:22 PM  Result Value Ref Range   B Natriuretic Peptide 1,620.0 (H) 0.0 - 100.0 pg/mL  Troponin I (High Sensitivity)     Status: None   Collection Time: 12/17/18  9:24 PM  Result Value Ref Range   Troponin I (High Sensitivity) 12 <18 ng/L   ____________________________________________  EKG ED ECG REPORT I, Merlyn Lot, the attending physician, personally viewed and interpreted this ECG.   Date: 12/17/2018  EKG Time: 18:30  Rate: 80  Rhythm:  A sensed, v-paced  Axis: right  Intervals: borderling prolonged qt  ST&T Change: paced rhythm, no sgarbossa criteria  ____________________________________________  RADIOLOGY I personally reviewed all radiographic images ordered to evaluate for the above acute complaints and reviewed radiology reports and findings.  These findings were personally discussed with the patient.  Please see medical record for radiology report.  ____________________________________________   PROCEDURES  Procedure(s) performed:  Procedures    Critical Care performed: no ____________________________________________   INITIAL IMPRESSION / ASSESSMENT AND PLAN / ED  COURSE  Pertinent labs & imaging results that were available during my care of the patient were reviewed by me and considered in my medical decision making (see chart for details).   DDX: gastritis, enteritis, hernia, sbo, chf, pna, copd, covid 19  NALLELY YOST is a 68 y.o. who presents  to the ED with symptoms as described above.  Patient clinically appears in no acute distress.  Borderline tachypnea the patient states she feels like her respirations are baseline.  Chest x-ray does show vascular congestion and cardiomegaly concerning for edema and acute on chronic congestive heart failure have the patient's on her home oxygen.  Will give Lasix and reassess.  EKG is paced but does not show any evidence of ischemia.  She is not having any active chest pain.  CT imaging will be ordered to exclude abdominal pathology.  No signs of infectious process.  Clinical Course as of Dec 17 2223  Tue Dec 17, 2018  2218 Reassessed.  That she feels well.  States she is not having any worsening shortness of breath in excess of her norm.  Serial enzymes are negative.  At this point I think she would be appropriate for close outpatient follow-up in heart failure clinic.  No reported barriers to close outpatient follow-up per patient.  Repeat abdominal exam soft and benign.  At this point I think she is appropriate for discharge.   [PR]    Clinical Course User Index [PR] Merlyn Lot, MD     The patient was evaluated in Emergency Department today for the symptoms described in the history of present illness. He/she was evaluated in the context of the global COVID-19 pandemic, which necessitated consideration that the patient might be at risk for infection with the SARS-CoV-2 virus that causes COVID-19. Institutional protocols and algorithms that pertain to the evaluation of patients at risk for COVID-19 are in a state of rapid change based on information released by regulatory bodies including the CDC and federal  and state organizations. These policies and algorithms were followed during the patient's care in the ED.  As part of my medical decision making, I reviewed the following data within the [place] notes reviewed and incorporated, Labs reviewed, notes from prior ED visits and [place] Controlled Substance Database   ____________________________________________   FINAL CLINICAL IMPRESSION(S) / ED DIAGNOSES  Final diagnoses:  Epigastric pain  Acute on chronic congestive heart failure, unspecified heart failure type ([place])      NEW MEDICATIONS STARTED DURING THIS VISIT:  New Prescriptions   No medications on file     Note:  This document was prepared using Dragon voice recognition software and may include unintentional dictation errors.    Merlyn Lot, MD 12/17/18 2225

## 2018-12-17 NOTE — ED Notes (Signed)
Pt ambulated w/a steady gait. Denies dizziness/SOB at this time. Pt O2 level at 95%-100%. HR 93.

## 2018-12-18 LAB — SARS CORONAVIRUS 2 (TAT 6-24 HRS): SARS Coronavirus 2: NEGATIVE

## 2018-12-20 ENCOUNTER — Ambulatory Visit: Payer: Medicare Other | Admitting: Family

## 2018-12-24 ENCOUNTER — Other Ambulatory Visit: Payer: Self-pay | Admitting: Family Medicine

## 2019-01-16 ENCOUNTER — Other Ambulatory Visit: Payer: Self-pay

## 2019-01-16 MED ORDER — CYCLOBENZAPRINE HCL 5 MG PO TABS: 5.0000 mg | ORAL_TABLET | Freq: Three times a day (TID) | ORAL | 0 refills | Status: DC | PRN

## 2019-01-17 ENCOUNTER — Telehealth: Payer: Self-pay

## 2019-01-17 NOTE — Telephone Encounter (Signed)
Tried calling patient, no answer and no VM available. Will try to call again later.

## 2019-01-17 NOTE — Telephone Encounter (Signed)
Copied from [place] [id number]. Topic: General - Inquiry >> Jan 16, 2019  3:02 PM Richardo Priest, [place] wrote: Reason for CRM: Pt called in stating she has been having the abdominal pain again since before the holidays. Pt states she is wondering what else can be done as she was advised in the ED it may be all her past surgeries and medical issues coming together causing this pain. Pt was already referred to GI. Please advise.   Routing to provider. Please advise.

## 2019-01-17 NOTE — Telephone Encounter (Signed)
Needs appt for ER f/u if not improving

## 2019-01-20 NOTE — Telephone Encounter (Signed)
Called and spoke with patient. Advised her of what Apolonio Schneiders said. Patient states she is doing better and that she has an appointment with GI coming up. Advised patient to call us if she needs anything and patient verbalized understanding.

## 2019-02-03 ENCOUNTER — Other Ambulatory Visit: Payer: Self-pay

## 2019-02-03 ENCOUNTER — Encounter: Payer: Self-pay | Admitting: Gastroenterology

## 2019-02-03 ENCOUNTER — Ambulatory Visit (INDEPENDENT_AMBULATORY_CARE_PROVIDER_SITE_OTHER): Payer: Medicare Other | Admitting: Gastroenterology

## 2019-02-03 VITALS — Temp 96.5°F | Ht 64.0 in | Wt 210.2 lb

## 2019-02-03 DIAGNOSIS — M7918 Myalgia, other site: Secondary | ICD-10-CM

## 2019-02-03 NOTE — Progress Notes (Signed)
Primary Care Physician: Volney American, PA-C  Primary Gastroenterologist:  Dr. Lucilla Lame  No chief complaint on file.   HPI: Maria Neal is a 69 y.o. female here for follow-up of her abdominal pain.  The patient was seen by me back in October 2020 with abdominal discomfort that she reported to be worse when she coughed or moved.  The patient's physical exam showed the discomfort to be reproducible by 1 finger palpation while flexing the abdominal wall muscles consistent with musculoskeletal pain.  In December the patient went back to the emergency room with continued abdominal pain and was found to have acute on chronic CHF and was treated for that and had a CT scan that showed no sign of any acute process or cause of her abdominal pain.  The patient reports that she does get some relief with Flexeril and with warm compresses.  She also reports that she continues to have worsening of her symptoms when she moves.  She has stayed away from greasy and fatty foods despite not having her gallbladder.  Current Outpatient Medications  Medication Sig Dispense Refill  . albuterol (PROVENTIL HFA;VENTOLIN HFA) 108 (90 Base) MCG/ACT inhaler Inhale 2 puffs into the lungs every 6 (six) hours as needed for wheezing or shortness of breath.    Marland Kitchen aspirin EC 81 MG tablet Take 81 mg by mouth daily.    Marland Kitchen atorvastatin (LIPITOR) 20 MG tablet TAKE 1 TABLET BY MOUTH ONCE DAILY 90 tablet 1  . carvedilol (COREG) 25 MG tablet Take 25 mg by mouth 2 (two) times daily.    . cyclobenzaprine (FLEXERIL) 5 MG tablet Take 1 tablet (5 mg total) by mouth 3 (three) times daily as needed for muscle spasms. 30 tablet 0  . fluticasone (FLONASE) 50 MCG/ACT nasal spray Place 2 sprays into both nostrils daily. 16 g 6  . HYDROcodone-acetaminophen (NORCO/VICODIN) 5-325 MG tablet Take 1 tablet by mouth every 6 (six) hours as needed. (Patient not taking: Reported on 08/23/2018) 12 tablet 0  . ibuprofen (ADVIL) 800 MG tablet Take  1 tablet (800 mg total) by mouth every 8 (eight) hours as needed for mild pain or moderate pain. 30 tablet 0  . losartan (COZAAR) 50 MG tablet Take 50 mg by mouth daily.    Marland Kitchen nystatin cream (MYCOSTATIN) Apply 1 application topically 2 (two) times daily. 90 g 1  . OXYGEN Inhale 3 L into the lungs 3 (three) times daily as needed (shortness of breath or coughing).    . pantoprazole (PROTONIX) 40 MG tablet TAKE 1 TABLET BY MOUTH ONCE DAILY 30 tablet 3  . spironolactone (ALDACTONE) 25 MG tablet Take 12.5 mg by mouth daily.     No current facility-administered medications for this visit.    Allergies as of 02/03/2019 - Review Complete 12/17/2018  Allergen Reaction Noted  . Levaquin [levofloxacin] Anaphylaxis 05/03/2014  . Amoxicillin Hives 05/03/2014  . Nitrofuran derivatives Other (See Comments) 05/03/2014  . Penicillins Other (See Comments) 05/03/2014  . Zithromax [azithromycin] Other (See Comments) 05/03/2014  . Antihistamines, chlorpheniramine-type Rash 04/22/2010    ROS:  General: Negative for anorexia, weight loss, fever, chills, fatigue, weakness. ENT: Negative for hoarseness, difficulty swallowing , nasal congestion. CV: Negative for chest pain, angina, palpitations, dyspnea on exertion, peripheral edema.  Respiratory: Negative for dyspnea at rest, dyspnea on exertion, cough, sputum, wheezing.  GI: See history of present illness. GU:  Negative for dysuria, hematuria, urinary incontinence, urinary frequency, nocturnal urination.  Endo: Negative for unusual  weight change.    Physical Examination:   There were no vitals taken for this visit.  General: Well-nourished, well-developed in no acute distress.  Eyes: No icterus. Conjunctivae pink. Lungs: Clear to auscultation bilaterally. Non-labored. Heart: Regular rate and rhythm, no murmurs rubs or gallops.  Abdomen: Bowel sounds are normal, positive tenderness with reproduction of the pain with flexing the abdominal wall muscles  nondistended, no hepatosplenomegaly or masses, no abdominal bruits or hernia , no rebound or guarding.   Extremities: No lower extremity edema. No clubbing or deformities. Neuro: Alert and oriented x 3.  Grossly intact. Skin: Warm and dry, no jaundice.   Psych: Alert and cooperative, normal mood and affect. Labs:    Imaging Studies: No results found.  Assessment and Plan:   Maria Neal is a 69 y.o. y/o female who comes in with continued abdominal pain.  On physical exam the abdominal pain is again clearly musculoskeletal.  The patient also reports that the Flexeril and warm compresses help her which would not make a difference if this was intestinal pain.  The patient has been told to continue the Flexeril and use warm compresses to the abdominal wall and use NSAIDs with food when needed for the musculoskeletal pain.  The patient has been explained the plan and agrees with it.     Lucilla Lame, MD. Marval Regal    Note: This dictation was prepared with Dragon dictation along with smaller phrase technology. Any transcriptional errors that result from this process are unintentional.

## 2019-02-06 ENCOUNTER — Encounter (INDEPENDENT_AMBULATORY_CARE_PROVIDER_SITE_OTHER): Payer: Self-pay | Admitting: Vascular Surgery

## 2019-02-06 ENCOUNTER — Other Ambulatory Visit: Payer: Self-pay

## 2019-02-06 ENCOUNTER — Ambulatory Visit (INDEPENDENT_AMBULATORY_CARE_PROVIDER_SITE_OTHER): Payer: Medicare Other | Admitting: Vascular Surgery

## 2019-02-06 VITALS — BP 94/58 | HR 79 | Resp 17 | Ht 64.0 in | Wt 203.0 lb

## 2019-02-06 DIAGNOSIS — I8312 Varicose veins of left lower extremity with inflammation: Secondary | ICD-10-CM | POA: Diagnosis not present

## 2019-02-06 DIAGNOSIS — I8311 Varicose veins of right lower extremity with inflammation: Secondary | ICD-10-CM | POA: Diagnosis not present

## 2019-02-09 ENCOUNTER — Encounter (INDEPENDENT_AMBULATORY_CARE_PROVIDER_SITE_OTHER): Payer: Self-pay | Admitting: Vascular Surgery

## 2019-02-09 NOTE — Progress Notes (Signed)
    MRN : [id number]  Maria Neal is a 69 y.o. (1950/05/04) female who presents with chief complaint of painful varicose veins.    The patient's left lower extremity was sterilely prepped and draped.  The ultrasound machine was used to visualize the left great saphenous vein throughout its course.  A segment at the knee was selected for access.  The saphenous vein was accessed without difficulty using ultrasound guidance with a micropuncture needle.   An 0.018  wire was placed beyond the saphenofemoral junction through the sheath and the microneedle was removed.  The 65 cm sheath was then placed over the wire and the wire and dilator were removed.  The laser fiber was placed through the sheath and its tip was placed approximately 2 cm below the saphenofemoral junction.  Tumescent anesthesia was then created with a dilute lidocaine solution.  Laser energy was then delivered with constant withdrawal of the sheath and laser fiber.  Approximately 1857 Joules of energy were delivered over a length of 33 cm.  Sterile dressings were placed.  The patient tolerated the procedure well without complications.

## 2019-02-10 ENCOUNTER — Other Ambulatory Visit: Payer: Self-pay

## 2019-02-10 ENCOUNTER — Other Ambulatory Visit (INDEPENDENT_AMBULATORY_CARE_PROVIDER_SITE_OTHER): Payer: Self-pay | Admitting: Vascular Surgery

## 2019-02-10 ENCOUNTER — Ambulatory Visit (INDEPENDENT_AMBULATORY_CARE_PROVIDER_SITE_OTHER): Payer: Medicare Other

## 2019-02-10 DIAGNOSIS — Z9889 Other specified postprocedural states: Secondary | ICD-10-CM | POA: Diagnosis not present

## 2019-02-18 ENCOUNTER — Ambulatory Visit: Payer: Self-pay | Admitting: *Deleted

## 2019-02-18 ENCOUNTER — Ambulatory Visit (INDEPENDENT_AMBULATORY_CARE_PROVIDER_SITE_OTHER): Payer: Medicare Other | Admitting: Nurse Practitioner

## 2019-02-18 ENCOUNTER — Telehealth (INDEPENDENT_AMBULATORY_CARE_PROVIDER_SITE_OTHER): Payer: Self-pay

## 2019-02-18 ENCOUNTER — Encounter: Payer: Self-pay | Admitting: Nurse Practitioner

## 2019-02-18 ENCOUNTER — Other Ambulatory Visit: Payer: Self-pay

## 2019-02-18 VITALS — BP 114/70 | HR 89 | Temp 97.4°F | Ht 64.0 in | Wt 198.0 lb

## 2019-02-18 DIAGNOSIS — R21 Rash and other nonspecific skin eruption: Secondary | ICD-10-CM

## 2019-02-18 MED ORDER — DIPHENHYDRAMINE HCL 12.5 MG PO CHEW: 12.5000 mg | CHEWABLE_TABLET | Freq: Every evening | ORAL | 0 refills | Status: DC | PRN

## 2019-02-18 MED ORDER — TRIAMCINOLONE ACETONIDE 0.025 % EX CREA: 1.0000 "application " | TOPICAL_CREAM | Freq: Two times a day (BID) | CUTANEOUS | 0 refills | Status: DC

## 2019-02-18 NOTE — Progress Notes (Signed)
BP 114/70 (BP Location: Left Arm, Patient Position: Sitting, Cuff Size: Normal)   Pulse 89   Temp (!) 97.4 F (36.3 C) (Oral)   Ht 5\' 4"  (1.626 m)   Wt 198 lb (89.8 kg)   SpO2 (!) 84%   BMI 33.99 kg/m    Subjective:    Patient ID: Maria Neal, female    DOB: [birth year], 69 y.o.   MRN: [id number]  HPI: Maria Neal is a 69 y.o. female  Chief Complaint  Patient presents with  . Rash    Ongoing 3 days. Left arm and back. Rash itches.   RASH Duration:  days  Location: back, left upper arm  Itching: yes Burning: no Redness: yes Oozing: no Scaling: no Blisters: no Painful: yes Fevers: no Change in detergents/soaps/personal care products: no Recent illness: no Recent travel:no Context: fluctuating Alleviating factors: alcohol Treatments attempted:alcohol Shortness of breath: yes ; baseline for patient Throat/tongue swelling: no Myalgias/arthralgias: no  Itches mostly at night, tried   Allergies  Allergen Reactions  . Levaquin [Levofloxacin] Anaphylaxis  . Amoxicillin Hives    Did it involve swelling of the face/tongue/throat, SOB, or low BP? No Did it involve sudden or severe rash/hives, skin peeling, or any reaction on the inside of your mouth or nose? No Did you need to seek medical attention at a hospital or doctor's office? No When did it last happen?10+ years If all above answers are "NO", may proceed with cephalosporin use.  [place] Spikes Derivatives Other (See Comments)    Unknown  . Penicillins Other (See Comments)    Thinks it made her itch a lot, but isn't sure. Did it involve swelling of the face/tongue/throat, SOB, or low BP? No Did it involve sudden or severe rash/hives, skin peeling, or any reaction on the inside of your mouth or nose? No Did you need to seek medical attention at a hospital or doctor's office? No When did it last happen?10+ years If all above answers are "NO", may proceed with cephalosporin use.   Marland Kitchen Zithromax  [Azithromycin] Other (See Comments)  . Antihistamines, Chlorpheniramine-Type Rash   Outpatient Encounter Medications as of 02/18/2019  Medication Sig Note  . albuterol (PROVENTIL HFA;VENTOLIN HFA) 108 (90 Base) MCG/ACT inhaler Inhale 2 puffs into the lungs every 6 (six) hours as needed for wheezing or shortness of breath.   Marland Kitchen aspirin EC 81 MG tablet Take 81 mg by mouth daily.   Marland Kitchen atorvastatin (LIPITOR) 20 MG tablet TAKE 1 TABLET BY MOUTH ONCE DAILY   . carvedilol (COREG) 25 MG tablet Take 25 mg by mouth 2 (two) times daily.   . cyclobenzaprine (FLEXERIL) 5 MG tablet Take 1 tablet (5 mg total) by mouth 3 (three) times daily as needed for muscle spasms.   . fluticasone (FLONASE) 50 MCG/ACT nasal spray Place 2 sprays into both nostrils daily.   Marland Kitchen HYDROcodone-acetaminophen (NORCO/VICODIN) 5-325 MG tablet Take 1 tablet by mouth every 6 (six) hours as needed.   Marland Kitchen ibuprofen (ADVIL) 800 MG tablet Take 1 tablet (800 mg total) by mouth every 8 (eight) hours as needed for mild pain or moderate pain.   Marland Kitchen losartan (COZAAR) 50 MG tablet Take 50 mg by mouth daily.   Marland Kitchen nystatin cream (MYCOSTATIN) Apply 1 application topically 2 (two) times daily.   . OXYGEN Inhale 3 L into the lungs 3 (three) times daily as needed (shortness of breath or coughing).   . pantoprazole (PROTONIX) 40 MG tablet TAKE 1 TABLET BY MOUTH ONCE  DAILY 12/09/2018: As needed  . spironolactone (ALDACTONE) 25 MG tablet Take 12.5 mg by mouth daily.   . diphenhydrAMINE (BENADRYL) 12.5 MG chewable tablet Chew 1 tablet (12.5 mg total) by mouth at bedtime as needed for itching.   . triamcinolone (KENALOG) 0.025 % cream Apply 1 application topically 2 (two) times daily.    No facility-administered encounter medications on file as of 02/18/2019.   Patient Active Problem List   Diagnosis Date Noted  . Leg pain 07/23/2018  . 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/23/2018  . Chronic venous insufficiency 07/23/2018  . Simple  endometrial hyperplasia without atypia 07/04/2018  . Epigastric pain 06/28/2018  . Acute appendicitis 02/17/2018  . Cervical radiculitis 05/17/2017  . Hypercholesteremia 05/02/2017  . Obesity (BMI 30-39.9) 05/02/2017  . Mitral valve insufficiency 04/16/2017  . Closed fracture of lateral malleolus 03/08/2017  . Chronic systolic congestive heart failure, NYHA class 3 ([place]) 08/07/2016  . NICM (nonischemic cardiomyopathy) ([place]) 08/07/2016  . Bundle branch block, left 10/02/2014  . Benign essential HTN 05/07/2014  . CAD (coronary artery disease) 05/07/2014  . COPD (chronic obstructive pulmonary disease) ([place]) 05/07/2014  . S/P cardiac catheterization 04/23/2014  . SOB (shortness of breath) on exertion 11/06/2013   Past Medical History:  Diagnosis Date  . AICD (automatic cardioverter/defibrillator) present   . Anxiety   . CAD (coronary artery disease)   . Cardiomyopathy ([place])   . CHF (congestive heart failure) ([place])   . COPD (chronic obstructive pulmonary disease) ([place])   . Depression   . Dyspnea   . Headache   . History of kidney stones   . History of shingles   . Hypertension   . Insomnia   . On home oxygen therapy    bedtime and prn  . Restless leg syndrome    Review of Systems  Constitutional: Negative.  Negative for activity change, appetite change and fever.  HENT: Negative.  Negative for sore throat, trouble swallowing and voice change.   Eyes: Negative.   Respiratory: Positive for shortness of breath (baseline per patient). Negative for cough, chest tightness and wheezing.   Cardiovascular: Negative.  Negative for chest pain, palpitations and leg swelling.  Gastrointestinal: Negative.   Musculoskeletal: Negative.   Skin: Positive for rash. Negative for pallor and wound.  Neurological: Negative.  Negative for dizziness, weakness, light-headedness and headaches.  Psychiatric/Behavioral: Negative.    Per HPI unless specifically indicated above    Objective:    BP  114/70 (BP Location: Left Arm, Patient Position: Sitting, Cuff Size: Normal)   Pulse 89   Temp (!) 97.4 F (36.3 C) (Oral)   Ht 5\' 4"  (1.626 m)   Wt 198 lb (89.8 kg)   SpO2 (!) 84%   BMI 33.99 kg/m   Wt Readings from Last 3 Encounters:  02/18/19 198 lb (89.8 kg)  02/06/19 203 lb (92.1 kg)  02/03/19 210 lb 3.2 oz (95.3 kg)    Physical Exam Vitals and nursing note reviewed.  Constitutional:      General: She is not in acute distress.    Appearance: Normal appearance. She is normal weight. She is not toxic-appearing.  HENT:     Head: Normocephalic and atraumatic.  Eyes:     General: No scleral icterus.       Right eye: No discharge.        Left eye: No discharge.     Extraocular Movements: Extraocular movements intact.  Cardiovascular:     Rate and Rhythm: Normal  rate.  Pulmonary:     Effort: Pulmonary effort is normal. No respiratory distress.  Musculoskeletal:        General: Normal range of motion.     Cervical back: Normal range of motion.     Right lower leg: No edema.     Left lower leg: No edema.  Skin:    General: Skin is warm.     Coloration: Skin is pale. Skin is not jaundiced.     Findings: Rash present. No bruising or erythema. Rash is macular, papular and urticarial. Rash is not crusting, nodular, purpuric, pustular, scaling or vesicular.       Neurological:     General: No focal deficit present.     Mental Status: She is alert and oriented to person, place, and time.     Motor: No weakness.     Gait: Gait normal.  Psychiatric:        Mood and Affect: Mood normal.        Behavior: Behavior normal.        Thought Content: Thought content normal.        Judgment: Judgment normal.       Assessment & Plan:   Problem List Items Addressed This Visit    None    Visit Diagnoses    Rash and nonspecific skin eruption    -  Primary   Will start on Kenalog bid.  Advised that can also take Benadryl at night to prevent itching.  Return or call clinic if  symptoms not improving by Friday.   Relevant Medications   triamcinolone (KENALOG) 0.025 % cream   diphenhydrAMINE (BENADRYL) 12.5 MG chewable tablet       Follow up plan: Return if symptoms worsen or fail to improve.

## 2019-02-18 NOTE — Telephone Encounter (Signed)
Agree with RN plan to first discuss with prescribing provider but if sxs persist and are not thought to be from the xanax she should be evaluated with an OV

## 2019-02-18 NOTE — Telephone Encounter (Signed)
Patient called c/o itching left arm and shoulder after taking Xanax on Friday morning and again on Friday evening.  The Xanax was prescribed by Dr. Delana Meyer with [facility].  Denies having any other symptoms or changes.    Patient denies increase SOB or change in her breathing.  Note:  Patient is on home oxygen.  Patient instructed to reach out to Dr. Nino Parsley office to inform him of this reaction to Xanax and to get instructions as to what he would have her do for this reaction.  Patient verbalized understanding.  Reminded patient she could always call back at any time.    Reason for Disposition . [1] Caller has URGENT medication question about med that PCP or specialist prescribed AND [2] triager unable to answer question    Xanax prescribed by Vascular Specialist pre and post procedure.  Itching and rash \ itching started after patient took this medication.  Patient strongly encouraged to call Vascular office to discuss this reaction.  Answer Assessment - Initial Assessment Questions 1.   NAME of MEDICATION: "What medicine are you calling about?"     Xanax  2.   QUESTION: "What is your question?"     Rash and the itching - what can I take for this rash and itching.   3.   PRESCRIBING HCP: "Who prescribed it?" Reason: if prescribed by specialist, call should be referred to that group.     Dr. Delana Meyer - [facility] 4. SYMPTOMS: "Do you have any symptoms?"     Itching on left arm and shoulder 5. SEVERITY: If symptoms are present, ask "Are they mild, moderate or severe?"    Itching mild - cannot sleep at night  6.  PREGNANCY:  "Is there any chance that you are pregnant?" "When was your last menstrual period?"     NA  Answer Assessment - Initial Assessment Questions 1. APPEARANCE of RASH: "Describe the rash." (e.g., spots, blisters, raised areas, skin peeling, scaly)     "Rash is like an allergic reaction to a medication.  Left arm and shoulder itches and there is  red.  If it were all over my body, then I would be going crazy?   2. SIZE: "How big are the spots?" (e.g., tip of pen, eraser, coin; inches, centimeters)      3. LOCATION: "Where is the rash located? "Left arm and shoulder"     4. COLOR: "What color is the rash?" (Note: It is difficult to assess rash color in people with darker-colored skin. When this situation occurs, simply ask the caller to describe what they see.)     "red" 5. ONSET: "When did the rash begin?"     "After taking xanax on Friday." 6. FEVER: "Do you have a fever?" If so, ask: "What is your temperature, how was it measured, and when did it start?"   "No fever - no other new symptoms.  No SOB."   7. ITCHING: "Does the rash itch?" If so, ask: "How bad is the itch?" (Scale 1-10; or mild, moderate, severe)     "Yes mild, but I am unable to sleep."  8. CAUSE: "What do you think is causing the rash?"     "Xanax is the only new medication I have been prescribed.  I was instructed to take one before my procedure on Friday morning and again Friday evening."   9. NEW MEDICATION: "What new medication are you taking?" (e.g., name of antibiotic) "When did you start  taking this medication?".     Xanax  10. OTHER SYMPTOMS: "Do you have any other symptoms?" (e.g., sore throat, fever, joint pain)       Denies SOB or any other new symptoms.   11. PREGNANCY: "Is there any chance you are pregnant?" "When was your last menstrual period?"       NA  Answer Assessment - Initial Assessment Questions 1. APPEARANCE of RASH: "Describe the rash."      Red rash that itches on Left arm and shoulder 2. LOCATION: "Where is the rash located?"      Left arm and shoulder 3. NUMBER: "How many spots are there?"      Generalized redness 4. SIZE: "How big are the spots?" (Inches, centimeters or compare to size of a coin)      "Generalized redness on left arm and shoulder and redness from where I have been scratching.  Need to know what I can take." 5. ONSET:  "When did the rash start?"      After taking xanax on Friday.   6. ITCHING: "Does the rash itch?" If so, ask: "How bad is the itch?"  (Scale 1-10; or mild, moderate, severe)     Mild 7. PAIN: "Does the rash hurt?" If so, ask: "How bad is the pain?"  (Scale 1-10; or mild, moderate, severe)     Mild 8. OTHER SYMPTOMS: "Do you have any other symptoms?" (e.g., fever)     Denies any new symptoms other than itching and rash on left arm and shoulder.  Denies SOB or change in breathing pattern.     9. PREGNANCY: "Is there any chance you are pregnant?" "When was your last menstrual period?"    NA  Protocols used: MEDICATION QUESTION CALL-A-AH, RASH - WIDESPREAD ON DRUGS-A-AH, RASH OR REDNESS - LOCALIZED-A-AH

## 2019-02-25 NOTE — Telephone Encounter (Signed)
complete

## 2019-03-13 ENCOUNTER — Telehealth: Payer: Self-pay

## 2019-03-13 NOTE — Telephone Encounter (Signed)
Needs in person appt.

## 2019-03-13 NOTE — Telephone Encounter (Signed)
She can try miralax

## 2019-03-13 NOTE — Telephone Encounter (Addendum)
Laser surgery last month on left leg, both feet are now completely swollen has been elevating them, unsure of what else she can do?  She is having to use her oxygen a lot more, she would like to know about getting her rechecked to get portable oxygen.  She is still having abdominal pain and that when she eats certain foods it will cause her to bloat more, is there anymore options for her.   Daughter states that she is in a lot of pain and does not know what to do to help her.   Please contact patients daughter to discuss these issues.   Copied from [place] [phone]. Topic: General - Other >> Mar 13, 2019 10:40 AM Leward Quan A wrote: Reason for CRM: Patient daughter Maria Neal called to speak to Merrie Roof in regards to concerns that she have about the patient her mom asking for a call back at Ph# [phone]

## 2019-03-13 NOTE — Telephone Encounter (Signed)
Called pt's daughter jennifer scheduled appt for tomorrow. She states pt is constipated and is wondering if it is ok to take something otc. Please advise.

## 2019-03-13 NOTE — Telephone Encounter (Signed)
Patient notified

## 2019-03-14 ENCOUNTER — Encounter: Payer: Self-pay | Admitting: Family Medicine

## 2019-03-14 ENCOUNTER — Other Ambulatory Visit: Payer: Self-pay

## 2019-03-14 ENCOUNTER — Ambulatory Visit
Admission: RE | Admit: 2019-03-14 | Discharge: 2019-03-14 | Disposition: A | Discharge status: Home / Self Care | Payer: Medicare Other | Source: Ambulatory Visit | Attending: Family Medicine | Admitting: Family Medicine

## 2019-03-14 ENCOUNTER — Ambulatory Visit (INDEPENDENT_AMBULATORY_CARE_PROVIDER_SITE_OTHER): Payer: Medicare Other | Admitting: Family Medicine

## 2019-03-14 VITALS — BP 112/68 | HR 74 | Temp 97.5°F | Ht 64.0 in | Wt 214.0 lb

## 2019-03-14 DIAGNOSIS — E7800 Pure hypercholesterolemia, unspecified: Secondary | ICD-10-CM | POA: Diagnosis not present

## 2019-03-14 DIAGNOSIS — R0602 Shortness of breath: Secondary | ICD-10-CM | POA: Diagnosis not present

## 2019-03-14 DIAGNOSIS — I5022 Chronic systolic (congestive) heart failure: Secondary | ICD-10-CM

## 2019-03-14 DIAGNOSIS — J449 Chronic obstructive pulmonary disease, unspecified: Secondary | ICD-10-CM

## 2019-03-14 DIAGNOSIS — I10 Essential (primary) hypertension: Secondary | ICD-10-CM | POA: Diagnosis not present

## 2019-03-14 MED ORDER — ANORO ELLIPTA 62.5-25 MCG/INH IN AEPB: 1.0000 | INHALATION_SPRAY | Freq: Every day | RESPIRATORY_TRACT | 0 refills | Status: DC

## 2019-03-14 MED ORDER — FUROSEMIDE 40 MG PO TABS: 40.0000 mg | ORAL_TABLET | Freq: Every day | ORAL | 0 refills | Status: DC

## 2019-03-14 NOTE — Patient Instructions (Addendum)
[facility] [address] [place], [place] [address] [phone]

## 2019-03-14 NOTE — Progress Notes (Signed)
BP 112/68   Pulse 74   Temp (!) 97.5 F (36.4 C) (Oral)   Ht 5\' 4"  (1.626 m)   Wt 214 lb (97.1 kg)   SpO2 93%   BMI 36.73 kg/m    Subjective:    Patient ID: Maria Neal, female    DOB: 1950-04-12, 69 y.o.   MRN: [id number]  HPI: Maria Neal is a 69 y.o. female  Chief Complaint  Patient presents with  . Shortness of Breath    x a week   Patient presenting today with about a week of SOB, b/l leg edema and wheezing. States her legs become swollen in this way from time to time, and they feel tight and achy currently. Known hx of CHF venous insufficiency, and COPD. Currently taking spironolactone for diuretic therapy and albuterol prn for wheezing. Denies fever, chills, CP, palpitations, hx of DVT. Followed by Cardiology and vascular surgery as well.   Relevant past medical, surgical, family and social history reviewed and updated as indicated. Interim medical history since our last visit reviewed. Allergies and medications reviewed and updated.  Review of Systems  Per HPI unless specifically indicated above     Objective:    BP 112/68   Pulse 74   Temp (!) 97.5 F (36.4 C) (Oral)   Ht 5\' 4"  (1.626 m)   Wt 214 lb (97.1 kg)   SpO2 93%   BMI 36.73 kg/m   Wt Readings from Last 3 Encounters:  04/03/19 213 lb (96.6 kg)  03/19/19 208 lb (94.3 kg)  03/14/19 214 lb (97.1 kg)    Physical Exam Vitals and nursing note reviewed.  Constitutional:      Appearance: Normal appearance. She is not ill-appearing.  HENT:     Head: Atraumatic.  Eyes:     Extraocular Movements: Extraocular movements intact.     Conjunctiva/sclera: Conjunctivae normal.  Cardiovascular:     Rate and Rhythm: Normal rate and regular rhythm.     Heart sounds: Normal heart sounds.  Pulmonary:     Effort: Pulmonary effort is normal.     Breath sounds: No rales.     Comments: Mildly decreased breath sounds throughout, no obvious wheezes on auscultation Musculoskeletal:        General: Swelling (2+  pitting edema b/l lower legs diffucsely) present. Normal range of motion.     Cervical back: Normal range of motion and neck supple.     Comments: - homans sign and squeeze test   Skin:    General: Skin is warm and dry.     Findings: No erythema, lesion or rash.  Neurological:     Mental Status: She is alert and oriented to person, place, and time.  Psychiatric:        Mood and Affect: Mood normal.        Thought Content: Thought content normal.        Judgment: Judgment normal.     Results for orders placed or performed in visit on 03/14/19  Comprehensive metabolic panel  Result Value Ref Range   Glucose 112 (H) 65 - 99 mg/dL   BUN 17 8 - 27 mg/dL   Creatinine, Ser 1.23 (H) 0.57 - 1.00 mg/dL   GFR calc non Af Amer 45 (L) >59 mL/min/1.73   GFR calc Af Amer 52 (L) >59 mL/min/1.73   BUN/Creatinine Ratio 14 12 - 28   Sodium 144 134 - 144 mmol/L   Potassium 4.5 3.5 - 5.2 mmol/L  Chloride 101 96 - 106 mmol/L   CO2 30 (H) 20 - 29 mmol/L   Calcium 8.8 8.7 - 10.3 mg/dL   Total Protein 5.9 (L) 6.0 - 8.5 g/dL   Albumin 3.7 (L) 3.8 - 4.8 g/dL   Globulin, Total 2.2 1.5 - 4.5 g/dL   Albumin/Globulin Ratio 1.7 1.2 - 2.2   Bilirubin Total 0.8 0.0 - 1.2 mg/dL   Alkaline Phosphatase 61 39 - 117 IU/L   AST 19 0 - 40 IU/L   ALT 12 0 - 32 IU/L  Lipid Panel w/o Chol/HDL Ratio  Result Value Ref Range   Cholesterol, Total 81 (L) 100 - 199 mg/dL   Triglycerides 80 0 - 149 mg/dL   HDL 41 >39 mg/dL   VLDL Cholesterol Cal 17 5 - 40 mg/dL   LDL Chol Calc (NIH) 23 0 - 99 mg/dL      Assessment & Plan:   Problem List Items Addressed This Visit      Cardiovascular and Mediastinum   Benign essential HTN (Chronic)   Chronic systolic congestive heart failure, NYHA class 3 (HCC)    Start lasix 40 mg daily x 7 days, recheck at that point with bmp and exam. Monitor BPs closely while taking, and discussed consistent use of compression stockings. Continue remainder of regimen as before. Obtain CXR          Respiratory   COPD (chronic obstructive pulmonary disease) (HCC) (Chronic)    Suspect her SOB is multifactorial. Will start anoro and monitor for benefit given her wheezing and SOB. Continue prn albuterol        Other   Hypercholesteremia   Relevant Orders   Comprehensive metabolic panel (Completed)   Lipid Panel w/o Chol/HDL Ratio (Completed)    Other Visit Diagnoses    SOB (shortness of breath)    -  Primary   Relevant Orders   DG Chest 2 View (Completed)       Follow up plan: Return in about 1 week (around 03/21/2019) for SOB, leg edema f/u.

## 2019-03-15 LAB — COMPREHENSIVE METABOLIC PANEL
ALT: 12 IU/L (ref 0–32)
AST: 19 IU/L (ref 0–40)
Albumin/Globulin Ratio: 1.7 (ref 1.2–2.2)
Albumin: 3.7 g/dL — ABNORMAL LOW (ref 3.8–4.8)
Alkaline Phosphatase: 61 IU/L (ref 39–117)
BUN/Creatinine Ratio: 14 (ref 12–28)
BUN: 17 mg/dL (ref 8–27)
Bilirubin Total: 0.8 mg/dL (ref 0.0–1.2)
CO2: 30 mmol/L — ABNORMAL HIGH (ref 20–29)
Calcium: 8.8 mg/dL (ref 8.7–10.3)
Chloride: 101 mmol/L (ref 96–106)
Creatinine, Ser: 1.23 mg/dL — ABNORMAL HIGH (ref 0.57–1.00)
GFR calc Af Amer: 52 mL/min/{1.73_m2} — ABNORMAL LOW (ref >59)
GFR calc non Af Amer: 45 mL/min/{1.73_m2} — ABNORMAL LOW (ref >59)
Globulin, Total: 2.2 g/dL (ref 1.5–4.5)
Glucose: 112 mg/dL — ABNORMAL HIGH (ref 65–99)
Potassium: 4.5 mmol/L (ref 3.5–5.2)
Sodium: 144 mmol/L (ref 134–144)
Total Protein: 5.9 g/dL — ABNORMAL LOW (ref 6.0–8.5)

## 2019-03-15 LAB — LIPID PANEL W/O CHOL/HDL RATIO
Cholesterol, Total: 81 mg/dL — ABNORMAL LOW (ref 100–199)
HDL: 41 mg/dL (ref >39)
LDL Chol Calc (NIH): 23 mg/dL (ref 0–99)
Triglycerides: 80 mg/dL (ref 0–149)
VLDL Cholesterol Cal: 17 mg/dL (ref 5–40)

## 2019-03-19 ENCOUNTER — Telehealth: Payer: Self-pay

## 2019-03-19 ENCOUNTER — Ambulatory Visit: Payer: Medicare Other | Admitting: Family Medicine

## 2019-03-19 ENCOUNTER — Encounter: Payer: Self-pay | Admitting: Family Medicine

## 2019-03-19 ENCOUNTER — Other Ambulatory Visit: Payer: Self-pay

## 2019-03-19 ENCOUNTER — Ambulatory Visit (INDEPENDENT_AMBULATORY_CARE_PROVIDER_SITE_OTHER): Payer: Medicare Other | Admitting: Family Medicine

## 2019-03-19 VITALS — BP 113/75 | HR 81 | Temp 97.3°F | Wt 208.0 lb

## 2019-03-19 DIAGNOSIS — I5022 Chronic systolic (congestive) heart failure: Secondary | ICD-10-CM | POA: Diagnosis not present

## 2019-03-19 DIAGNOSIS — J449 Chronic obstructive pulmonary disease, unspecified: Secondary | ICD-10-CM | POA: Diagnosis not present

## 2019-03-19 DIAGNOSIS — R600 Localized edema: Secondary | ICD-10-CM | POA: Diagnosis not present

## 2019-03-19 MED ORDER — FUROSEMIDE 40 MG PO TABS: 40.0000 mg | ORAL_TABLET | Freq: Every day | ORAL | 0 refills | Status: DC

## 2019-03-19 MED ORDER — ANORO ELLIPTA 62.5-25 MCG/INH IN AEPB: 1.0000 | INHALATION_SPRAY | Freq: Every day | RESPIRATORY_TRACT | 2 refills | Status: DC

## 2019-03-19 NOTE — Telephone Encounter (Signed)
Advise

## 2019-03-19 NOTE — Progress Notes (Signed)
BP 113/75   Pulse 81   Temp (!) 97.3 F (36.3 C) (Oral)   Wt 208 lb (94.3 kg)   SpO2 (!) 85%   BMI 35.70 kg/m    Subjective:    Patient ID: Maria Neal, female    DOB: 11/07/1950, 69 y.o.   MRN: [id number]  HPI: Maria Neal is a 69 y.o. female  Chief Complaint  Patient presents with  . Edema    f/u  . Shortness of Breath   Presenting for 1 week LE edema and SOB f/u after starting lasix and anoro. Sxs much improved. Still some leg edema but much less. Wearing compression stockings intermittently throughout day. Breathing much better, able to move around much more without dyspnea, sleeping laying down more comfortably. Denies side effects, CP, dizziness.   Relevant past medical, surgical, family and social history reviewed and updated as indicated. Interim medical history since our last visit reviewed. Allergies and medications reviewed and updated.  Review of Systems  Per HPI unless specifically indicated above     Objective:    BP 113/75   Pulse 81   Temp (!) 97.3 F (36.3 C) (Oral)   Wt 208 lb (94.3 kg)   SpO2 (!) 85%   BMI 35.70 kg/m   Wt Readings from Last 3 Encounters:  03/19/19 208 lb (94.3 kg)  03/14/19 214 lb (97.1 kg)  02/18/19 198 lb (89.8 kg)    Physical Exam Vitals and nursing note reviewed.  Constitutional:      Appearance: Normal appearance. She is not ill-appearing.  HENT:     Head: Atraumatic.  Eyes:     Extraocular Movements: Extraocular movements intact.     Conjunctiva/sclera: Conjunctivae normal.  Cardiovascular:     Rate and Rhythm: Normal rate and regular rhythm.  Pulmonary:     Effort: Pulmonary effort is normal. No respiratory distress.     Breath sounds: Normal breath sounds. No wheezing or rales.  Musculoskeletal:        General: Swelling (1+ edema b/l LEs) present. No tenderness. Normal range of motion.     Cervical back: Normal range of motion and neck supple.  Skin:    General: Skin is warm and dry.     Findings: No  erythema.  Neurological:     Mental Status: She is alert and oriented to person, place, and time.  Psychiatric:        Mood and Affect: Mood normal.        Thought Content: Thought content normal.        Judgment: Judgment normal.     Results for orders placed or performed in visit on 71/24/58  Basic metabolic panel  Result Value Ref Range   Glucose 110 (H) 65 - 99 mg/dL   BUN 20 8 - 27 mg/dL   Creatinine, Ser 1.17 (H) 0.57 - 1.00 mg/dL   GFR calc non Af Amer 48 (L) >59 mL/min/1.73   GFR calc Af Amer 55 (L) >59 mL/min/1.73   BUN/Creatinine Ratio 17 12 - 28   Sodium 144 134 - 144 mmol/L   Potassium 3.6 3.5 - 5.2 mmol/L   Chloride 96 96 - 106 mmol/L   CO2 33 (H) 20 - 29 mmol/L   Calcium 8.4 (L) 8.7 - 10.3 mg/dL      Assessment & Plan:   Problem List Items Addressed This Visit      Cardiovascular and Mediastinum   Chronic systolic congestive heart failure, NYHA class 3 (HCC) -  Primary    Will continue the lasix given persistent edema and benefit. Continue compression hose, sodium restriction.       Relevant Medications   furosemide (LASIX) 40 MG tablet   Other Relevant Orders   Basic metabolic panel (Completed)     Respiratory   COPD (chronic obstructive pulmonary disease) (HCC) (Chronic)    Good benefit with anoro, continue current regimen      Relevant Medications   umeclidinium-vilanterol (ANORO ELLIPTA) 62.5-25 MCG/INH AEPB    Other Visit Diagnoses    Bilateral leg edema       Significantly improved but still present. Continue lasix, compression, sodium restriction       Follow up plan: Return in about 4 weeks (around 04/16/2019) for Leg swelling f/u.

## 2019-03-19 NOTE — Telephone Encounter (Signed)
Pt's daughter [name] left msg on triage line that she needed to know name of Dr who along with AMS decided her mom's appendix needed to be taken out. Pt is having similar pain again and was told by PCP they needed to contact provider who did her appendix surgery. Please advise.

## 2019-03-20 LAB — BASIC METABOLIC PANEL
BUN/Creatinine Ratio: 17 (ref 12–28)
BUN: 20 mg/dL (ref 8–27)
CO2: 33 mmol/L — ABNORMAL HIGH (ref 20–29)
Calcium: 8.4 mg/dL — ABNORMAL LOW (ref 8.7–10.3)
Chloride: 96 mmol/L (ref 96–106)
Creatinine, Ser: 1.17 mg/dL — ABNORMAL HIGH (ref 0.57–1.00)
GFR calc Af Amer: 55 mL/min/{1.73_m2} — ABNORMAL LOW (ref >59)
GFR calc non Af Amer: 48 mL/min/{1.73_m2} — ABNORMAL LOW (ref >59)
Glucose: 110 mg/dL — ABNORMAL HIGH (ref 65–99)
Potassium: 3.6 mmol/L (ref 3.5–5.2)
Sodium: 144 mmol/L (ref 134–144)

## 2019-03-21 ENCOUNTER — Other Ambulatory Visit: Payer: Self-pay | Admitting: Family Medicine

## 2019-03-21 DIAGNOSIS — R7309 Other abnormal glucose: Secondary | ICD-10-CM

## 2019-03-21 DIAGNOSIS — R7989 Other specified abnormal findings of blood chemistry: Secondary | ICD-10-CM

## 2019-03-21 NOTE — Assessment & Plan Note (Signed)
Will continue the lasix given persistent edema and benefit. Continue compression hose, sodium restriction.

## 2019-03-21 NOTE — Assessment & Plan Note (Signed)
Good benefit with anoro, continue current regimen

## 2019-03-25 ENCOUNTER — Telehealth (INDEPENDENT_AMBULATORY_CARE_PROVIDER_SITE_OTHER): Payer: Self-pay

## 2019-03-25 NOTE — Telephone Encounter (Signed)
The pt said se was seen the following Friday after her laser . I called her and made her aware of the above.

## 2019-03-25 NOTE — Telephone Encounter (Signed)
The patient had an ultrasound but she didn't see a provider.  We typically have patients back 4 weeks after their laser procedure to see a provider to see how they are healing.  That is the appointment that she needs.

## 2019-03-25 NOTE — Telephone Encounter (Signed)
Pt says she had laser last month she would like to know is there an OTC cream for itching  She can use on the site from her laser.

## 2019-03-25 NOTE — Telephone Encounter (Signed)
She can use OTC cortisone cream, also I noticed that the patient does not have a f/u visit for her laser.  Can we see about getting one scheduled with her or Schnier? Thanks

## 2019-03-25 NOTE — Telephone Encounter (Signed)
I tried to call the pt and make her aware that she still needs to come in and be see for a follow up appointment however the number she called from and that is listed in her chart is no longer working.

## 2019-03-26 ENCOUNTER — Telehealth (INDEPENDENT_AMBULATORY_CARE_PROVIDER_SITE_OTHER): Payer: Self-pay

## 2019-03-26 NOTE — Telephone Encounter (Signed)
Order generated. Will fax after signature.

## 2019-03-26 NOTE — Telephone Encounter (Signed)
I called pt to make her aware that she needs to schedule her F/U appointment from her laser . The number listed for the pt is no longer working and the pt s  Daughter isn't answering the phone I called several times today and yesterday.

## 2019-03-26 NOTE — Telephone Encounter (Signed)
Please generate order for portable O2 tank for her CHF with dyspnea on exertion  Copied from [place] [id number]. Topic: General - Other >> Mar 25, 2019 11:30 AM Leward Quan A wrote: Reason for CRM: Patient daughter Mallori Araque called to check with Merrie Roof on the progress of her mother possibly getting the new oxygen tank that was discussed. She would like a call back for an update on the progress and to know what the next step will be in getting this portable oxygen tank. Please call Sibley Rolison at Ph# [phone] >> Mar 26, 2019 12:05 PM Rainey Pines A wrote: Daughter called back and would like a callback in regards to when patient needs to come back in for labs. Please advise

## 2019-03-26 NOTE — Telephone Encounter (Signed)
Daughter called stating that mom is unable to tolerate any clothing on treated leg, requesting to be seen ASAP

## 2019-03-26 NOTE — Telephone Encounter (Signed)
Returned pt's daughters call and made aware per the NP the Pt her mother still needs to be seen for a follow up from her laser.

## 2019-03-27 NOTE — Telephone Encounter (Signed)
Patients daughter notified that oxygen order was sent earlier today.

## 2019-03-27 NOTE — Telephone Encounter (Signed)
Order faxed to [place]. For the labs, when did you want her to come in for those Apolonio Schneiders? I saw you entered a couple future orders on 03/21/19 but do not see a message as to when you want the patient to come have those done.

## 2019-03-27 NOTE — Telephone Encounter (Signed)
Pt daughter [name] called for an update on the portable oxygen tank. [facility] request call back. Cb# [phone]

## 2019-03-28 ENCOUNTER — Other Ambulatory Visit: Payer: Medicare Other

## 2019-03-28 ENCOUNTER — Other Ambulatory Visit: Payer: Self-pay

## 2019-03-28 DIAGNOSIS — R7309 Other abnormal glucose: Secondary | ICD-10-CM

## 2019-03-28 DIAGNOSIS — R7989 Other specified abnormal findings of blood chemistry: Secondary | ICD-10-CM

## 2019-03-29 LAB — BASIC METABOLIC PANEL
BUN/Creatinine Ratio: 19 (ref 12–28)
BUN: 21 mg/dL (ref 8–27)
CO2: 30 mmol/L — ABNORMAL HIGH (ref 20–29)
Calcium: 8.3 mg/dL — ABNORMAL LOW (ref 8.7–10.3)
Chloride: 97 mmol/L (ref 96–106)
Creatinine, Ser: 1.13 mg/dL — ABNORMAL HIGH (ref 0.57–1.00)
GFR calc Af Amer: 58 mL/min/{1.73_m2} — ABNORMAL LOW (ref >59)
GFR calc non Af Amer: 50 mL/min/{1.73_m2} — ABNORMAL LOW (ref >59)
Glucose: 99 mg/dL (ref 65–99)
Potassium: 3.8 mmol/L (ref 3.5–5.2)
Sodium: 143 mmol/L (ref 134–144)

## 2019-03-29 LAB — HEMOGLOBIN A1C
Est. average glucose Bld gHb Est-mCnc: 131 mg/dL
Hgb A1c MFr Bld: 6.2 % — ABNORMAL HIGH (ref 4.8–5.6)

## 2019-03-31 ENCOUNTER — Telehealth (INDEPENDENT_AMBULATORY_CARE_PROVIDER_SITE_OTHER): Payer: Self-pay | Admitting: Vascular Surgery

## 2019-04-01 ENCOUNTER — Encounter: Payer: Self-pay | Admitting: Family Medicine

## 2019-04-01 DIAGNOSIS — R7301 Impaired fasting glucose: Secondary | ICD-10-CM | POA: Insufficient documentation

## 2019-04-03 ENCOUNTER — Encounter (INDEPENDENT_AMBULATORY_CARE_PROVIDER_SITE_OTHER): Payer: Self-pay | Admitting: Nurse Practitioner

## 2019-04-03 ENCOUNTER — Ambulatory Visit (INDEPENDENT_AMBULATORY_CARE_PROVIDER_SITE_OTHER): Payer: Medicare Other | Admitting: Nurse Practitioner

## 2019-04-03 ENCOUNTER — Other Ambulatory Visit: Payer: Self-pay

## 2019-04-03 VITALS — BP 100/65 | HR 86 | Resp 16 | Ht 64.0 in | Wt 213.0 lb

## 2019-04-03 DIAGNOSIS — I8311 Varicose veins of right lower extremity with inflammation: Secondary | ICD-10-CM

## 2019-04-03 DIAGNOSIS — L03116 Cellulitis of left lower limb: Secondary | ICD-10-CM | POA: Diagnosis not present

## 2019-04-03 DIAGNOSIS — I8312 Varicose veins of left lower extremity with inflammation: Secondary | ICD-10-CM | POA: Diagnosis not present

## 2019-04-03 DIAGNOSIS — I10 Essential (primary) hypertension: Secondary | ICD-10-CM | POA: Diagnosis not present

## 2019-04-03 MED ORDER — DOXYCYCLINE HYCLATE 100 MG PO CAPS: 100.0000 mg | ORAL_CAPSULE | Freq: Two times a day (BID) | ORAL | 0 refills | Status: DC

## 2019-04-03 MED ORDER — LORATADINE 10 MG PO TABS: 10.0000 mg | ORAL_TABLET | Freq: Every day | ORAL | 5 refills | Status: DC

## 2019-04-04 ENCOUNTER — Other Ambulatory Visit: Payer: Self-pay | Admitting: Gastroenterology

## 2019-04-06 NOTE — Assessment & Plan Note (Addendum)
Start lasix 40 mg daily x 7 days, recheck at that point with bmp and exam. Monitor BPs closely while taking, and discussed consistent use of compression stockings. Continue remainder of regimen as before. Obtain CXR

## 2019-04-06 NOTE — Assessment & Plan Note (Signed)
Suspect her SOB is multifactorial. Will start anoro and monitor for benefit given her wheezing and SOB. Continue prn albuterol

## 2019-04-08 ENCOUNTER — Encounter (INDEPENDENT_AMBULATORY_CARE_PROVIDER_SITE_OTHER): Payer: Self-pay | Admitting: Nurse Practitioner

## 2019-04-08 ENCOUNTER — Telehealth: Payer: Self-pay | Admitting: Family Medicine

## 2019-04-08 NOTE — Telephone Encounter (Signed)
Unable to lvm for pt to make this apt.

## 2019-04-08 NOTE — Telephone Encounter (Signed)
Spoke with [name], patient's daughter regarding the patient's back continues to itch. There is no bumps or redness noted. Reports her lower back is bruised from her scratching so much. She was prescribed Claritin daily and OTC benadryl cream by vascular and vein but no improvement. Using Aquaphor lotion to the area with no improvement.  Placed on Doxycycline for cellulitis on her leg which has cleared up.Taking lasix with good daily output.  Requesting something different for the itching. Voiced to the daughter we may need to make appointment since it was  Eulogio Ditch, NP who prescribed the current treatment.  Care advice including keep the area open to air and dry.  Routing to pcp for advice or appointment.  Katy on file.

## 2019-04-08 NOTE — Telephone Encounter (Signed)
Pts daughter called stating that the pt is still dealing with spots on her back. Pts daughter states that the pt has bruises from having to scratch so much and is requesting to have a steroid cream in for her that is stronger. Please advise.     [place], [place] [address]  Phone: [phone] Fax: [phone]  Not a 24 hour pharmacy; exact hours not known.

## 2019-04-08 NOTE — Telephone Encounter (Signed)
Patient daughter states that she will be taking patient to urgent care.

## 2019-04-08 NOTE — Telephone Encounter (Signed)
Routing to provider  

## 2019-04-08 NOTE — Progress Notes (Signed)
Subjective:    Patient ID: Maria Neal, female    DOB: [birth year], 69 y.o.   MRN: [id number] Chief Complaint  Patient presents with  . Follow-up    3-4wks post laser     The patient returns to the office for followup status post laser ablation of the left great saphenous vein on 02/06/2019. The patient notes multiple residual varicosities bilaterally which continued to hurt with dependent positions and remained tender to palpation. The patient's swelling is unchanged from preoperative status. The patient continues to wear graduated compression stockings on a daily basis but these are not eliminating the pain and discomfort. The patient continues to use over-the-counter anti-inflammatory medications to treat the pain and related symptoms but this has not given the patient relief. The patient notes the pain in the lower extremities is causing problems with daily exercise, problems at work and even with household activities such as preparing meals and doing dishes.  The patient is otherwise done well and there have been no complications related to the laser procedure or interval changes in the patient's overall   Venous ultrasound post laser shows successful laser ablation of the left great saphenous vein, no DVT identified.  The patient has also had significant itching since the procedure in her left lower extremity and has developed cellulitis.     Review of Systems  Cardiovascular: Positive for leg swelling.  Skin:       Itching  All other systems reviewed and are negative.      Objective:   Physical Exam Vitals reviewed.  Constitutional:      Appearance: Normal appearance.  HENT:     Head: Normocephalic.  Cardiovascular:     Pulses: Normal pulses.     Comments: Multiple prominent varicosities on LLE  Skin:    Findings: Erythema and wound present.       Neurological:     Mental Status: She is alert.     BP 100/65 (BP Location: Right Arm)   Pulse 86   Resp 16   Ht 5'  4" (1.626 m)   Wt 213 lb (96.6 kg)   BMI 36.56 kg/m   Past Medical History:  Diagnosis Date  . AICD (automatic cardioverter/defibrillator) present   . Anxiety   . CAD (coronary artery disease)   . Cardiomyopathy ([place])   . CHF (congestive heart failure) ([place])   . COPD (chronic obstructive pulmonary disease) ([place])   . Depression   . Dyspnea   . Headache   . History of kidney stones   . History of shingles   . Hypertension   . Insomnia   . On home oxygen therapy    bedtime and prn  . Restless leg syndrome     Social History   Socioeconomic History  . Marital status: Divorced    Spouse name: Not on file  . Number of children: Not on file  . Years of education: Not on file  . Highest education level: High school graduate  Occupational History  . Occupation: retired  Tobacco Use  . Smoking status: Former Smoker    Packs/day: 0.25    Types: Cigarettes  . Smokeless tobacco: Never Used  . Tobacco comment: quit at age 44, whole pack lasted 3 weeks   Substance and Sexual Activity  . Alcohol use: No    Alcohol/week: 0.0 standard drinks  . Drug use: No  . Sexual activity: Not Currently    Birth control/protection: None  Other Topics Concern  .  Not on file  Social History Narrative  . Not on file   Social Determinants of Health   Financial Resource Strain:   . Difficulty of Paying Living Expenses:   Food Insecurity:   . Worried About Charity fundraiser in the Last Year:   . Arboriculturist in the Last Year:   Transportation Needs:   . Film/video editor (Medical):   Marland Kitchen Lack of Transportation (Non-Medical):   Physical Activity:   . Days of Exercise per Week:   . Minutes of Exercise per Session:   Stress:   . Feeling of Stress :   Social Connections:   . Frequency of Communication with Friends and Family:   . Frequency of Social Gatherings with Friends and Family:   . Attends Religious Services:   . Active Member of Clubs or Organizations:   . Attends English as a second language teacher Meetings:   Marland Kitchen Marital Status:   Intimate Partner Violence:   . Fear of Current or Ex-Partner:   . Emotionally Abused:   Marland Kitchen Physically Abused:   . Sexually Abused:     Past Surgical History:  Procedure Laterality Date  . CARDIAC CATHETERIZATION  04/2014   Dr. Idelle Leech  . CARDIAC CATHETERIZATION    . CARDIAC DEFIBRILLATOR PLACEMENT    . CHOLECYSTECTOMY N/A 08/23/2016   Procedure: LAPAROSCOPIC CHOLECYSTECTOMY;  Surgeon: Clayburn Pert, MD;  Location: [facility] ORS;  Service: General;  Laterality: N/A;  . DILATATION & CURETTAGE/HYSTEROSCOPY WITH MYOSURE N/A 06/27/2018   Procedure: DILATATION & CURETTAGE/HYSTEROSCOPY WITH MYOSURE;  Surgeon: Malachy Mood, MD;  Location: [facility] ORS;  Service: Gynecology;  Laterality: N/A;  . HERNIA REPAIR    . LAPAROSCOPIC APPENDECTOMY N/A 02/17/2018   Procedure: APPENDECTOMY LAPAROSCOPIC;  Surgeon: Benjamine Sprague, DO;  Location: [facility] ORS;  Service: General;  Laterality: N/A;    Family History  Problem Relation Age of Onset  . Diabetes Mellitus II Mother   . Hypertension Mother   . Heart attack Father   . Heart disease Brother   . Arthritis Sister   . Depression Daughter   . Depression Son   . Schizophrenia Son   . Arthritis Sister   . Diabetes Brother     Allergies  Allergen Reactions  . Levaquin [Levofloxacin] Anaphylaxis  . Amoxicillin Hives    Did it involve swelling of the face/tongue/throat, SOB, or low BP? No Did it involve sudden or severe rash/hives, skin peeling, or any reaction on the inside of your mouth or nose? No Did you need to seek medical attention at a hospital or doctor's office? No When did it last happen?10+ years If all above answers are "NO", may proceed with cephalosporin use.  [place] Spikes Derivatives Other (See Comments)    Unknown  . Penicillins Other (See Comments)    Thinks it made her itch a lot, but isn't sure. Did it involve swelling of the face/tongue/throat, SOB, or low BP? No Did it  involve sudden or severe rash/hives, skin peeling, or any reaction on the inside of your mouth or nose? No Did you need to seek medical attention at a hospital or doctor's office? No When did it last happen?10+ years If all above answers are "NO", may proceed with cephalosporin use.   Marland Kitchen Zithromax [Azithromycin] Other (See Comments)  . Antihistamines, Chlorpheniramine-Type Rash       Assessment & Plan:   1. Cellulitis of left lower extremity The patient has been itching and this is likely the cause for cellulitis.  We  will send an RX for claritin to help with itching in addition to recommending OTC benadryl cream.  We also prescribe doxycycline for the cellulitis.  Patient will return for evaluation in 3 weeks.   2. Varicose veins of both lower extremities with inflammation Recommend:  The patient has had successful ablation of the previously incompetent saphenous venous system but still has persistent symptoms of pain and swelling that are having a negative impact on daily life and daily activities.  Patient should undergo injection sclerotherapy to treat the residual varicosities.  The risks, benefits and alternative therapies were reviewed in detail with the patient.  All questions were answered.  The patient agrees to proceed with sclerotherapy at their convenience.  The patient will continue wearing the graduated compression stockings and using the over-the-counter pain medications to treat her symptoms.       3. Benign essential HTN Continue antihypertensive medications as already ordered, these medications have been reviewed and there are no changes at this time.    Current Outpatient Medications on File Prior to Visit  Medication Sig Dispense Refill  . albuterol (PROVENTIL HFA;VENTOLIN HFA) 108 (90 Base) MCG/ACT inhaler Inhale 2 puffs into the lungs every 6 (six) hours as needed for wheezing or shortness of breath.    Marland Kitchen aspirin EC 81 MG tablet Take 81 mg by mouth  daily.    Marland Kitchen atorvastatin (LIPITOR) 20 MG tablet TAKE 1 TABLET BY MOUTH ONCE DAILY 90 tablet 1  . carvedilol (COREG) 25 MG tablet Take 25 mg by mouth 2 (two) times daily.    . fluticasone (FLONASE) 50 MCG/ACT nasal spray Place 2 sprays into both nostrils daily. 16 g 6  . furosemide (LASIX) 40 MG tablet Take 1 tablet (40 mg total) by mouth daily. 30 tablet 0  . ibuprofen (ADVIL) 800 MG tablet Take 1 tablet (800 mg total) by mouth every 8 (eight) hours as needed for mild pain or moderate pain. 30 tablet 0  . losartan (COZAAR) 50 MG tablet Take 50 mg by mouth daily.    . OXYGEN Inhale 3 L into the lungs 3 (three) times daily as needed (shortness of breath or coughing).    . pantoprazole (PROTONIX) 40 MG tablet TAKE 1 TABLET BY MOUTH ONCE DAILY 30 tablet 3  . spironolactone (ALDACTONE) 25 MG tablet Take 12.5 mg by mouth daily.    Marland Kitchen umeclidinium-vilanterol (ANORO ELLIPTA) 62.5-25 MCG/INH AEPB Inhale 1 puff into the lungs daily. 60 each 2   No current facility-administered medications on file prior to visit.    There are no Patient Instructions on file for this visit. No follow-ups on file.   Kris Hartmann, NP

## 2019-04-08 NOTE — Telephone Encounter (Signed)
Needs appt

## 2019-04-16 ENCOUNTER — Other Ambulatory Visit: Payer: Self-pay

## 2019-04-16 ENCOUNTER — Encounter (INDEPENDENT_AMBULATORY_CARE_PROVIDER_SITE_OTHER): Payer: Self-pay | Admitting: Vascular Surgery

## 2019-04-16 ENCOUNTER — Ambulatory Visit (INDEPENDENT_AMBULATORY_CARE_PROVIDER_SITE_OTHER): Payer: Medicare Other | Admitting: Vascular Surgery

## 2019-04-16 VITALS — BP 93/62 | HR 81 | Ht 64.0 in | Wt 215.0 lb

## 2019-04-16 DIAGNOSIS — I8312 Varicose veins of left lower extremity with inflammation: Secondary | ICD-10-CM

## 2019-04-16 DIAGNOSIS — I8311 Varicose veins of right lower extremity with inflammation: Secondary | ICD-10-CM

## 2019-04-16 NOTE — Progress Notes (Signed)
Varicose veins of bilateral lower extremity with inflammation (454.1  I83.10) Current Plans   Indication: Patient presents with symptomatic varicose veins of the bilatera lower extremity.   Procedure: Sclerotherapy using hypertonic saline mixed with 1% Lidocaine was performed on the bilateral lower extremity. Compression wraps were placed. The patient tolerated the procedure well.

## 2019-04-17 ENCOUNTER — Telehealth: Payer: Self-pay | Admitting: Family Medicine

## 2019-04-17 NOTE — Telephone Encounter (Signed)
Order generated and placed in folder for signature

## 2019-04-17 NOTE — Telephone Encounter (Signed)
OK to generate order

## 2019-04-17 NOTE — Telephone Encounter (Signed)
Copied from [place] [id number]. Topic: General - Other >> Apr 17, 2019 10:08 AM Celene Kras wrote: Reason for CRM: Pts daughter states that the pts oxygen tank is not working for the pt. She states that it runs out too fast. She is requesting to have the over the shoulder tank.  Please advise.

## 2019-04-18 NOTE — Telephone Encounter (Signed)
Patient daughter  [name] is returning your call best # [phone]

## 2019-04-18 NOTE — Telephone Encounter (Signed)
Called and spoke with patient's daughter. Let her know what was going on and explained the call with [facility]. Does not want to pay out pf pocket due to the price. Asked if there was another company that may have this. Will call [facility] and see if they have this for the patient.

## 2019-04-18 NOTE — Telephone Encounter (Signed)
Order signed by Apolonio Schneiders. Called and spoke with patient's daughter to let her know that I was faxing order for portable oxygen to [facility]. Daughter states that the patient has a portable tank but it is very small and only lasts for about an hour. They are requesting a larger, over the shoulder tank.   Called [facility] because there is no specific place on the order form to specify which tank the patient needs. The rep I spoke with states that they do not currently have any of these in stock and don't know when they will. I was transferred to a rep named Pieter Partridge who states that the patient and her daughter can order the tank they are wanting online through their website but they would have to pay out of pocket, $2,295.00. If they do not want to do this, then they may have to change companies per the rep's with [facility]. Would also need a D/C order sent to them if they need to pick up current tank.  Will call patient's daughter to discuss this with them.

## 2019-04-18 NOTE — Telephone Encounter (Signed)
Called and spoke to [place] at [facility]. Explained the situation to her and she states that they do have the over the shoulder portable tank. But because the patient is on 3 liters of O2, it is probably not going to last much longer than the one the patient currently has. Will call patient's daughter back and let her know.

## 2019-04-18 NOTE — Telephone Encounter (Signed)
Spoke with daughter and gave her the information of what [facility] stated.  Daughter stated since the tank won't last much longer than the one she has, she will stick to the one she has now.  Explained to call back if there are any more questions or concerns.

## 2019-04-22 ENCOUNTER — Telehealth: Payer: Self-pay | Admitting: Family Medicine

## 2019-04-22 NOTE — Telephone Encounter (Signed)
Copied from [place] [id number]. Topic: General - Other >> Apr 22, 2019  2:29 PM Leward Quan A wrote: Reason for CRM: Patient daughter [name] called to inform Merrie Roof that her mom was put on another fluid pill by her heart Dr so they want to know if she should be taking both pills. Please call [name] at Ph# [phone]

## 2019-04-22 NOTE — Telephone Encounter (Signed)
Routing to provider for advise

## 2019-04-23 NOTE — Telephone Encounter (Signed)
Will need to consult Cardiology who wrote the new medication on that. Do not see obvious answer per review of recent notes

## 2019-04-23 NOTE — Telephone Encounter (Signed)
Maria Neal, patient's daughter (DPR reviewed) and spoke to her. Rachel's message relayed. Daughter verbalized understanding.

## 2019-04-30 ENCOUNTER — Telehealth (INDEPENDENT_AMBULATORY_CARE_PROVIDER_SITE_OTHER): Payer: Self-pay

## 2019-04-30 ENCOUNTER — Ambulatory Visit (INDEPENDENT_AMBULATORY_CARE_PROVIDER_SITE_OTHER): Payer: Medicare Other

## 2019-04-30 DIAGNOSIS — Z1211 Encounter for screening for malignant neoplasm of colon: Secondary | ICD-10-CM

## 2019-04-30 DIAGNOSIS — Z87891 Personal history of nicotine dependence: Secondary | ICD-10-CM | POA: Diagnosis not present

## 2019-04-30 DIAGNOSIS — Z0000 Encounter for general adult medical examination without abnormal findings: Secondary | ICD-10-CM

## 2019-04-30 NOTE — Patient Instructions (Signed)
Maria Neal , Thank you for taking time to come for your Medicare Wellness Visit. I appreciate your ongoing commitment to your health goals. Please review the following plan we discussed and let me know if I can assist you in the future.   Screening recommendations/referrals: Colonoscopy: cologuard ordered  Mammogram: declined  Bone Density: declined  Recommended yearly ophthalmology/optometry visit for glaucoma screening and checkup Recommended yearly dental visit for hygiene and checkup  Vaccinations: Influenza vaccine: up to date  Pneumococcal vaccine: up to date  Tdap vaccine: up to date  Shingles vaccine: up to date    Covid-19: declined   Advanced directives: Advance directive discussed with you today.Once this is complete please bring a copy in to our office so we can scan it into your chart.  Conditions/risks identified: none   Next appointment: Follow up in one year for your annual wellness visit.    Preventive Care 38 Years and Older, Female Preventive care refers to lifestyle choices and visits with your health care provider that can promote health and wellness. What does preventive care include?  A yearly physical exam. This is also called an annual well check.  Dental exams once or twice a year.  Routine eye exams. Ask your health care provider how often you should have your eyes checked.  Personal lifestyle choices, including:  Daily care of your teeth and gums.  Regular physical activity.  Eating a healthy diet.  Avoiding tobacco and drug use.  Limiting alcohol use.  Practicing safe sex.  Taking low-dose aspirin every day.  Taking vitamin and mineral supplements as recommended by your health care provider. What happens during an annual well check? The services and screenings done by your health care provider during your annual well check will depend on your age, overall health, lifestyle risk factors, and family history of disease. Counseling  Your  health care provider may ask you questions about your:  Alcohol use.  Tobacco use.  Drug use.  Emotional well-being.  Home and relationship well-being.  Sexual activity.  Eating habits.  History of falls.  Memory and ability to understand (cognition).  Work and work Statistician.  Reproductive health. Screening  You may have the following tests or measurements:  Height, weight, and BMI.  Blood pressure.  Lipid and cholesterol levels. These may be checked every 5 years, or more frequently if you are over 38 years old.  Skin check.  Lung cancer screening. You may have this screening every year starting at age 68 if you have a 30-pack-year history of smoking and currently smoke or have quit within the past 15 years.  Fecal occult blood test (FOBT) of the stool. You may have this test every year starting at age 109.  Flexible sigmoidoscopy or colonoscopy. You may have a sigmoidoscopy every 5 years or a colonoscopy every 10 years starting at age 22.  Hepatitis C blood test.  Hepatitis B blood test.  Sexually transmitted disease (STD) testing.  Diabetes screening. This is done by checking your blood sugar (glucose) after you have not eaten for a while (fasting). You may have this done every 1-3 years.  Bone density scan. This is done to screen for osteoporosis. You may have this done starting at age 74.  Mammogram. This may be done every 1-2 years. Talk to your health care provider about how often you should have regular mammograms. Talk with your health care provider about your test results, treatment options, and if necessary, the need for more tests. Vaccines  Your health care provider may recommend certain vaccines, such as:  Influenza vaccine. This is recommended every year.  Tetanus, diphtheria, and acellular pertussis (Tdap, Td) vaccine. You may need a Td booster every 10 years.  Zoster vaccine. You may need this after age 17.  Pneumococcal 13-valent  conjugate (PCV13) vaccine. One dose is recommended after age 14.  Pneumococcal polysaccharide (PPSV23) vaccine. One dose is recommended after age 55. Talk to your health care provider about which screenings and vaccines you need and how often you need them. This information is not intended to replace advice given to you by your health care provider. Make sure you discuss any questions you have with your health care provider. Document Released: 01/15/2015 Document Revised: 09/08/2015 Document Reviewed: 10/20/2014 Elsevier Interactive Patient Education  2017 [place] Prevention in the Home Falls can cause injuries. They can happen to people of all ages. There are many things you can do to make your home safe and to help prevent falls. What can I do on the outside of my home?  Regularly fix the edges of walkways and driveways and fix any cracks.  Remove anything that might make you trip as you walk through a door, such as a raised step or threshold.  Trim any bushes or trees on the path to your home.  Use bright outdoor lighting.  Clear any walking paths of anything that might make someone trip, such as rocks or tools.  Regularly check to see if handrails are loose or broken. Make sure that both sides of any steps have handrails.  Any raised decks and porches should have guardrails on the edges.  Have any leaves, snow, or ice cleared regularly.  Use sand or salt on walking paths during winter.  Clean up any spills in your garage right away. This includes oil or grease spills. What can I do in the bathroom?  Use night lights.  Install grab bars by the toilet and in the tub and shower. Do not use towel bars as grab bars.  Use non-skid mats or decals in the tub or shower.  If you need to sit down in the shower, use a plastic, non-slip stool.  Keep the floor dry. Clean up any water that spills on the floor as soon as it happens.  Remove soap buildup in the tub or  shower regularly.  Attach bath mats securely with double-sided non-slip rug tape.  Do not have throw rugs and other things on the floor that can make you trip. What can I do in the bedroom?  Use night lights.  Make sure that you have a light by your bed that is easy to reach.  Do not use any sheets or blankets that are too big for your bed. They should not hang down onto the floor.  Have a firm chair that has side arms. You can use this for support while you get dressed.  Do not have throw rugs and other things on the floor that can make you trip. What can I do in the kitchen?  Clean up any spills right away.  Avoid walking on wet floors.  Keep items that you use a lot in easy-to-reach places.  If you need to reach something above you, use a strong step stool that has a grab bar.  Keep electrical cords out of the way.  Do not use floor polish or wax that makes floors slippery. If you must use wax, use non-skid floor wax.  Do  not have throw rugs and other things on the floor that can make you trip. What can I do with my stairs?  Do not leave any items on the stairs.  Make sure that there are handrails on both sides of the stairs and use them. Fix handrails that are broken or loose. Make sure that handrails are as long as the stairways.  Check any carpeting to make sure that it is firmly attached to the stairs. Fix any carpet that is loose or worn.  Avoid having throw rugs at the top or bottom of the stairs. If you do have throw rugs, attach them to the floor with carpet tape.  Make sure that you have a light switch at the top of the stairs and the bottom of the stairs. If you do not have them, ask someone to add them for you. What else can I do to help prevent falls?  Wear shoes that:  Do not have high heels.  Have rubber bottoms.  Are comfortable and fit you well.  Are closed at the toe. Do not wear sandals.  If you use a stepladder:  Make sure that it is fully  opened. Do not climb a closed stepladder.  Make sure that both sides of the stepladder are locked into place.  Ask someone to hold it for you, if possible.  Clearly mark and make sure that you can see:  Any grab bars or handrails.  First and last steps.  Where the edge of each step is.  Use tools that help you move around (mobility aids) if they are needed. These include:  Canes.  Walkers.  Scooters.  Crutches.  Turn on the lights when you go into a dark area. Replace any light bulbs as soon as they burn out.  Set up your furniture so you have a clear path. Avoid moving your furniture around.  If any of your floors are uneven, fix them.  If there are any pets around you, be aware of where they are.  Review your medicines with your doctor. Some medicines can make you feel dizzy. This can increase your chance of falling. Ask your doctor what other things that you can do to help prevent falls. This information is not intended to replace advice given to you by your health care provider. Make sure you discuss any questions you have with your health care provider. Document Released: 10/15/2008 Document Revised: 05/27/2015 Document Reviewed: 01/23/2014 Elsevier Interactive Patient Education  2017 [employer].

## 2019-04-30 NOTE — Progress Notes (Signed)
Subjective:   Maria Neal is a 69 y.o. female who presents for Medicare Annual (Subsequent) preventive examination.  This visit is being conducted via phone call  - after an attmept to do on video chat - due to the COVID-19 pandemic. This patient has given me verbal consent via phone to conduct this visit, patient states they are participating from their home address. Some vital signs may be absent or patient reported.   Patient identification: identified by name, DOB, and current address.    Review of Systems:   Cardiac Risk Factors include: advanced age (>31men, >37 women);hypertension;dyslipidemia     Objective:     Vitals: There were no vitals taken for this visit.  There is no height or weight on file to calculate BMI.  Advanced Directives 04/30/2019 12/17/2018 10/06/2018 06/27/2018 06/24/2018 04/29/2018 03/15/2018  Does Patient Have a Medical Advance Directive? No No Yes No No No No  Type of Advance Directive - Public librarian;Living will - - - -  Does patient want to make changes to medical advance directive? - - Yes (ED - Information included in AVS) - - - -  Would patient like information on creating a medical advance directive? - - - - No - Patient declined - No - Patient declined    Tobacco Social History   Tobacco Use  Smoking Status Former Smoker  . Packs/day: 0.25  . Types: Cigarettes  Smokeless Tobacco Never Used  Tobacco Comment   quit at age 61, whole pack lasted 3 weeks      Counseling given: Not Answered Comment: quit at age 70, whole pack lasted 3 weeks    Clinical Intake:  Pre-visit preparation completed: Yes  Pain : No/denies pain     Nutritional Risks: None Diabetes: No  How often do you need to have someone help you when you read instructions, pamphlets, or other written materials from your doctor or pharmacy?: 1 - Never  Interpreter Needed?: No  Information entered by :: Xaniyah Buchholz,LPN  Past Medical History:  Diagnosis  Date  . AICD (automatic cardioverter/defibrillator) present   . Anxiety   . CAD (coronary artery disease)   . Cardiomyopathy ([place])   . CHF (congestive heart failure) ([place])   . COPD (chronic obstructive pulmonary disease) ([place])   . Depression   . Dyspnea   . Headache   . History of kidney stones   . History of shingles   . Hypertension   . Insomnia   . On home oxygen therapy    bedtime and prn  . Restless leg syndrome    Past Surgical History:  Procedure Laterality Date  . CARDIAC CATHETERIZATION  04/2014   Dr. Idelle Leech  . CARDIAC CATHETERIZATION    . CARDIAC DEFIBRILLATOR PLACEMENT    . CHOLECYSTECTOMY N/A 08/23/2016   Procedure: LAPAROSCOPIC CHOLECYSTECTOMY;  Surgeon: Clayburn Pert, MD;  Location: [facility] ORS;  Service: General;  Laterality: N/A;  . DILATATION & CURETTAGE/HYSTEROSCOPY WITH MYOSURE N/A 06/27/2018   Procedure: DILATATION & CURETTAGE/HYSTEROSCOPY WITH MYOSURE;  Surgeon: Malachy Mood, MD;  Location: [facility] ORS;  Service: Gynecology;  Laterality: N/A;  . HERNIA REPAIR    . LAPAROSCOPIC APPENDECTOMY N/A 02/17/2018   Procedure: APPENDECTOMY LAPAROSCOPIC;  Surgeon: Benjamine Sprague, DO;  Location: [facility] ORS;  Service: General;  Laterality: N/A;   Family History  Problem Relation Age of Onset  . Diabetes Mellitus II Mother   . Hypertension Mother   . Heart attack Father   . Heart disease Brother   .  Arthritis Sister   . Depression Daughter   . Depression Son   . Schizophrenia Son   . Arthritis Sister   . Diabetes Brother    Social History   Socioeconomic History  . Marital status: Divorced    Spouse name: Not on file  . Number of children: Not on file  . Years of education: Not on file  . Highest education level: High school graduate  Occupational History  . Occupation: retired  Tobacco Use  . Smoking status: Former Smoker    Packs/day: 0.25    Types: Cigarettes  . Smokeless tobacco: Never Used  . Tobacco comment: quit at age 88, whole pack lasted 3  weeks   Substance and Sexual Activity  . Alcohol use: No    Alcohol/week: 0.0 standard drinks  . Drug use: No  . Sexual activity: Not Currently    Birth control/protection: None  Other Topics Concern  . Not on file  Social History Narrative  . Not on file   Social Determinants of Health   Financial Resource Strain:   . Difficulty of Paying Living Expenses:   Food Insecurity: No Food Insecurity  . Worried About Charity fundraiser in the Last Year: Never true  . Ran Out of Food in the Last Year: Never true  Transportation Needs: No Transportation Needs  . Lack of Transportation (Medical): No  . Lack of Transportation (Non-Medical): No  Physical Activity:   . Days of Exercise per Week:   . Minutes of Exercise per Session:   Stress:   . Feeling of Stress :   Social Connections: Moderately Isolated  . Frequency of Communication with Friends and Family: More than three times a week  . Frequency of Social Gatherings with Friends and Family: More than three times a week  . Attends Religious Services: Never  . Active Member of Clubs or Organizations: No  . Attends Archivist Meetings: Never  . Marital Status: Widowed    Outpatient Encounter Medications as of 04/30/2019  Medication Sig  . albuterol (PROVENTIL HFA;VENTOLIN HFA) 108 (90 Base) MCG/ACT inhaler Inhale 2 puffs into the lungs every 6 (six) hours as needed for wheezing or shortness of breath.  Marland Kitchen aspirin EC 81 MG tablet Take 81 mg by mouth daily.  Marland Kitchen atorvastatin (LIPITOR) 20 MG tablet TAKE 1 TABLET BY MOUTH ONCE DAILY  . carvedilol (COREG) 25 MG tablet Take 25 mg by mouth 2 (two) times daily.  . cyclobenzaprine (FLEXERIL) 5 MG tablet TAKE 1 TABLET BY MOUTH 3 TIMES DAILY AS NEEDED  . doxycycline (VIBRAMYCIN) 100 MG capsule Take 1 capsule (100 mg total) by mouth 2 (two) times daily.  . fluticasone (FLONASE) 50 MCG/ACT nasal spray Place 2 sprays into both nostrils daily.  . furosemide (LASIX) 40 MG tablet Take 1  tablet (40 mg total) by mouth daily.  Marland Kitchen loratadine (CLARITIN) 10 MG tablet Take 1 tablet (10 mg total) by mouth daily.  Marland Kitchen losartan (COZAAR) 50 MG tablet Take 50 mg by mouth daily.  . OXYGEN Inhale 3 L into the lungs 3 (three) times daily as needed (shortness of breath or coughing).  . pantoprazole (PROTONIX) 40 MG tablet TAKE 1 TABLET BY MOUTH ONCE DAILY  . spironolactone (ALDACTONE) 25 MG tablet Take 12.5 mg by mouth daily.  Marland Kitchen umeclidinium-vilanterol (ANORO ELLIPTA) 62.5-25 MCG/INH AEPB Inhale 1 puff into the lungs daily.  Marland Kitchen ibuprofen (ADVIL) 800 MG tablet Take 1 tablet (800 mg total) by mouth every 8 (eight) hours  as needed for mild pain or moderate pain. (Patient not taking: Reported on 04/30/2019)   No facility-administered encounter medications on file as of 04/30/2019.    Activities of Daily Living In your present state of health, do you have any difficulty performing the following activities: 04/30/2019 06/24/2018  Hearing? N -  Vision? N -  Comment eyeglasses. Dr.bell annually -  Difficulty concentrating or making decisions? N -  Walking or climbing stairs? Y N  Comment SOB -  Dressing or bathing? N -  Doing errands, shopping? N N  Preparing Food and eating ? N -  Using the Toilet? N -  In the past six months, have you accidently leaked urine? N -  Do you have problems with loss of bowel control? N -  Managing your Medications? N -  Managing your Finances? N -  Housekeeping or managing your Housekeeping? N -  Some recent data might be hidden    Patient Care Team: Volney American, PA-C as PCP - General (Family Medicine) Isaias Cowman, MD as Consulting Physician (Cardiology) Malachy Mood, MD as Consulting Physician (Obstetrics and Gynecology) Erby Pian, MD as Referring Physician (Specialist)    Assessment:   This is a routine wellness examination for Ashley.  Exercise Activities and Dietary recommendations Current Exercise Habits: The patient does  not participate in regular exercise at present, Exercise limited by: respiratory conditions(s)  Goals Addressed   None     Fall Risk: Fall Risk  04/30/2019 04/29/2018 04/23/2018 05/16/2017 05/02/2017  Falls in the past year? 0 0 0 Yes Yes  Number falls in past yr: 0 - - 1 1  Injury with Fall? 0 - - Yes Yes  Risk Factor Category  - - - High Fall Risk -  Follow up - - Falls evaluation completed Falls evaluation completed Falls evaluation completed    [employer]:  Any stairs in or around the home? Yes front porch  If so, are there any without handrails? No   Home free of loose throw rugs in walkways, pet beds, electrical cords, etc? Yes  Adequate lighting in your home to reduce risk of falls? Yes   ASSISTIVE DEVICES UTILIZED TO PREVENT FALLS:  Life alert? No  Use of a cane, walker or w/c? No  Grab bars in the bathroom? No  Shower chair or bench in shower? No  Elevated toilet seat or a handicapped toilet? No   DME ORDERS:  DME order needed?  No   TIMED UP AND GO:  UNABLE TO PERFORM   Depression Screen PHQ 2/9 Scores 04/30/2019 04/29/2018 05/02/2017 04/23/2017  PHQ - 2 Score 0 0 0 0  PHQ- 9 Score - - 1 -     Cognitive Function     6CIT Screen 04/29/2018 04/23/2017  What Year? 0 points 0 points  What month? 0 points 0 points  What time? 0 points 0 points  Count back from 20 0 points 0 points  Months in reverse 0 points 0 points  Repeat phrase 0 points 0 points  Total Score 0 0    Immunization History  Administered Date(s) Administered  . Fluad Quad(high Dose 65+) 10/11/2018  . Influenza, High Dose Seasonal PF 10/13/2015, 09/18/2017  . Influenza,inj,quad, With Preservative 10/01/2014  . Influenza-Unspecified 09/22/2013, 10/07/2014  . Pneumococcal Conjugate-13 10/13/2015  . Pneumococcal Polysaccharide-23 09/14/2010, 05/09/2014  . Td 01/02/2013  . Zoster Recombinat (Shingrix) 02/04/2018    Qualifies for Shingles Vaccine? Shingrix  completed  Tdap: up to date   Flu Vaccine: up to date   Pneumococcal Vaccine: up to date   Covid-19 Vaccine: declined   Screening Tests Health Maintenance  Topic Date Due  . Fecal DNA (Cologuard)  Never done  . COVID-19 Vaccine (1) 05/16/2019 (Originally 09/14/1966)  . MAMMOGRAM  04/29/2020 (Originally 09/13/2000)  . DEXA SCAN  04/29/2020 (Originally 09/14/2015)  . Hepatitis C Screening  04/29/2020 (Originally 12-12-1950)  . PNA vac Low Risk Adult (2 of 2 - PPSV23) 05/09/2019  . INFLUENZA VACCINE  08/03/2019  . TETANUS/TDAP  01/03/2023    Cancer Screenings:  Colorectal Screening: cologuard ordered   Mammogram: declined   Bone Density: declined   Lung Cancer Screening: (Low Dose CT Chest recommended if Age 68-80 years, 30 pack-year currently smoking OR have quit w/in 15years.) does not qualify.    Additional Screening:  Hepatitis C Screening: does qualify, declined   Vision Screening: Recommended annual ophthalmology exams for early detection of glaucoma and other disorders of the eye. Is the patient up to date with their annual eye exam?  Yes  Who is the provider or what is the name of the office in which the pt attends annual eye exams? Dr.Bell   Dental Screening: Recommended annual dental exams for proper oral hygiene  Community Resource Referral:  CRR required this visit?  No      Plan:  I have personally reviewed and addressed the Medicare Annual Wellness questionnaire and have noted the following in the patient's chart:  A. Medical and social history B. Use of alcohol, tobacco or illicit drugs  C. Current medications and supplements D. Functional ability and status E.  Nutritional status F.  Physical activity G. Advance directives H. List of other physicians I.  Hospitalizations, surgeries, and ER visits in previous 12 months J.  [place] such as hearing and vision if needed, cognitive and depression L. Referrals and appointments   In  addition, I have reviewed and discussed with patient certain preventive protocols, quality metrics, and best practice recommendations. A written personalized care plan for preventive services as well as general preventive health recommendations were provided to patient.  Signed,    Bevelyn Ngo, LPN  2/44/0102 Nurse Health Advisor   Nurse Notes: none

## 2019-04-30 NOTE — Telephone Encounter (Signed)
Sclerotherapy typically doesn't cause swelling of the whole leg, if anything it may cause some redness and a feeling of knots as the vein is irritated..  What is worrisome to me is that her whole leg is swollen, including her thighs.  I would strongly suggest reaching out to her cardiologist as this could be a sign of worsening heart failure, which we wouldn't be able to address in our office.  We can bring her in to take a look to ensure it isn't a reaction the sclerotherapy.

## 2019-04-30 NOTE — Telephone Encounter (Signed)
Patient daughter was made aware with medical advice and she will reach out to her cardiologist. The patient daughter inform that she contact the office if she need to make an appointment.

## 2019-05-01 ENCOUNTER — Other Ambulatory Visit
Admission: RE | Admit: 2019-05-01 | Discharge: 2019-05-01 | Disposition: A | Discharge status: Home / Self Care | Payer: Medicare Other | Source: Ambulatory Visit | Attending: Physician Assistant | Admitting: Physician Assistant

## 2019-05-01 DIAGNOSIS — Z79899 Other long term (current) drug therapy: Secondary | ICD-10-CM | POA: Diagnosis present

## 2019-05-01 DIAGNOSIS — I5022 Chronic systolic (congestive) heart failure: Secondary | ICD-10-CM | POA: Diagnosis present

## 2019-05-01 LAB — BRAIN NATRIURETIC PEPTIDE: B Natriuretic Peptide: 4012 pg/mL — ABNORMAL HIGH (ref 0.0–100.0)

## 2019-05-07 ENCOUNTER — Other Ambulatory Visit: Payer: Self-pay | Admitting: Family Medicine

## 2019-05-07 ENCOUNTER — Other Ambulatory Visit: Payer: Self-pay | Admitting: Gastroenterology

## 2019-05-07 NOTE — Telephone Encounter (Signed)
Requested Prescriptions  Pending Prescriptions Disp Refills  . furosemide (LASIX) 40 MG tablet [Pharmacy Med Name: FUROSEMIDE 40 MG TAB] 90 tablet 0    Sig: TAKE 1 TABLET BY MOUTH ONCE DAILY     Cardiovascular:  Diuretics - Loop Failed - 05/07/2019 10:27 AM      Failed - Ca in normal range and within 360 days    Calcium  Date Value Ref Range Status  03/28/2019 8.3 (L) 8.7 - 10.3 mg/dL Final   Calcium, Total  Date Value Ref Range Status  12/07/2013 8.7 8.5 - 10.1 mg/dL Final         Failed - Cr in normal range and within 360 days    Creatinine  Date Value Ref Range Status  12/07/2013 0.77 0.60 - 1.30 mg/dL Final   Creatinine, Ser  Date Value Ref Range Status  03/28/2019 1.13 (H) 0.57 - 1.00 mg/dL Final         Passed - K in normal range and within 360 days    Potassium  Date Value Ref Range Status  03/28/2019 3.8 3.5 - 5.2 mmol/L Final  12/07/2013 3.8 3.5 - 5.1 mmol/L Final         Passed - Na in normal range and within 360 days    Sodium  Date Value Ref Range Status  03/28/2019 143 134 - 144 mmol/L Final  12/07/2013 139 136 - 145 mmol/L Final         Passed - Last BP in normal range    BP Readings from Last 1 Encounters:  04/16/19 93/62         Passed - Valid encounter within last 6 months    Recent Outpatient Visits          1 month ago Chronic systolic congestive heart failure, NYHA class 3 ([place])   [place], [place], PA-C   1 month ago SOB (shortness of breath)   [place], [place], [place]   2 months ago Rash and nonspecific skin eruption   [facility] Carnella Guadalajara I, NP   4 months ago Sore throat   [facility] Volney American, [place]   8 months ago Abdominal pain, unspecified abdominal location   [facility] Kathrine Haddock, NP             Lab note 03/28/19 states labs are stable

## 2019-05-14 ENCOUNTER — Ambulatory Visit (INDEPENDENT_AMBULATORY_CARE_PROVIDER_SITE_OTHER): Payer: Medicare Other | Admitting: Vascular Surgery

## 2019-05-14 ENCOUNTER — Encounter (INDEPENDENT_AMBULATORY_CARE_PROVIDER_SITE_OTHER): Payer: Self-pay | Admitting: Vascular Surgery

## 2019-05-14 ENCOUNTER — Other Ambulatory Visit: Payer: Self-pay

## 2019-05-14 VITALS — BP 99/64 | HR 87 | Resp 16 | Wt 191.8 lb

## 2019-05-14 DIAGNOSIS — I8312 Varicose veins of left lower extremity with inflammation: Secondary | ICD-10-CM

## 2019-05-14 DIAGNOSIS — I8311 Varicose veins of right lower extremity with inflammation: Secondary | ICD-10-CM | POA: Diagnosis not present

## 2019-05-14 NOTE — Progress Notes (Signed)
Varicose veins of left lower extremity with inflammation (454.1  I83.10) Current Plans   Indication: Patient presents with symptomatic varicose veins of the left lower extremity.   Procedure: Sclerotherapy using hypertonic saline mixed with 1% Lidocaine was performed on the left lower extremity. Compression wraps were placed. The patient tolerated the procedure well.  Of note: The patient has large varicosities noted to the left leg medial to the knee and inner thigh.  I attempted to treat this area with saline sclerotherapy with minimal improvement during her last visit.  Recommend foam sclerotherapy due to the size and lack of improvement with saline sclerotherapy.  These veins are painful to the patient and interfere with her ability to function on daily basis.

## 2019-06-11 ENCOUNTER — Ambulatory Visit (INDEPENDENT_AMBULATORY_CARE_PROVIDER_SITE_OTHER): Payer: Medicare Other | Admitting: Vascular Surgery

## 2019-06-12 ENCOUNTER — Encounter: Payer: Self-pay | Admitting: Emergency Medicine

## 2019-06-12 ENCOUNTER — Other Ambulatory Visit: Payer: Self-pay

## 2019-06-12 ENCOUNTER — Emergency Department
Admission: EM | Admit: 2019-06-12 | Discharge: 2019-06-13 | Disposition: A | Discharge status: Home / Self Care | Payer: Medicare Other | Attending: Emergency Medicine | Admitting: Emergency Medicine

## 2019-06-12 DIAGNOSIS — Z87891 Personal history of nicotine dependence: Secondary | ICD-10-CM | POA: Diagnosis not present

## 2019-06-12 DIAGNOSIS — I5022 Chronic systolic (congestive) heart failure: Secondary | ICD-10-CM | POA: Insufficient documentation

## 2019-06-12 DIAGNOSIS — Z79899 Other long term (current) drug therapy: Secondary | ICD-10-CM | POA: Diagnosis not present

## 2019-06-12 DIAGNOSIS — Z95811 Presence of heart assist device: Secondary | ICD-10-CM | POA: Insufficient documentation

## 2019-06-12 DIAGNOSIS — I110 Hypertensive heart disease with heart failure: Secondary | ICD-10-CM | POA: Diagnosis not present

## 2019-06-12 DIAGNOSIS — I2510 Atherosclerotic heart disease of native coronary artery without angina pectoris: Secondary | ICD-10-CM | POA: Diagnosis not present

## 2019-06-12 DIAGNOSIS — J449 Chronic obstructive pulmonary disease, unspecified: Secondary | ICD-10-CM | POA: Insufficient documentation

## 2019-06-12 DIAGNOSIS — I83899 Varicose veins of unspecified lower extremities with other complications: Secondary | ICD-10-CM | POA: Diagnosis not present

## 2019-06-12 DIAGNOSIS — Z7982 Long term (current) use of aspirin: Secondary | ICD-10-CM | POA: Insufficient documentation

## 2019-06-12 NOTE — ED Triage Notes (Signed)
Patient ambulatory to triage with steady gait, without difficulty or distress noted; pt reports bleeding varicose vein to left ankle; pt stopped bleeding PTA with use of a tea bag; pt with hx of same

## 2019-06-13 NOTE — ED Provider Notes (Signed)
[facility] Emergency Department Provider Note   ____________________________________________   First MD Initiated Contact with Patient 06/13/19 0002     (approximate)  I have reviewed the triage vital signs and the nursing notes.   HISTORY  Chief Complaint Ankle Injury    HPI Maria Neal is a 69 y.o. female with possible history of CAD, hypertension, CHF, and COPD on chronic oxygen presents to the ED complaining of bleeding.  Patient reports that she was taking a shower earlier this evening and when she was walking back to her kitchen suddenly noticed that she has having significant bleeding from her left ankle.  She has multiple varicose veins in that area, and she applied a tea bag to the bleeding vein before calling EMS.  When EMS arrived, they applied a dressing and patient denies any further bleeding since then.  She does not think she lost more than a cup of blood and is not on any blood thinners.  She denies any lightheadedness, chest pain, or shortness of breath.  She has not noticed any redness or pain to the area of bleeding.        Past Medical History:  Diagnosis Date  . AICD (automatic cardioverter/defibrillator) present   . Anxiety   . CAD (coronary artery disease)   . Cardiomyopathy ([place])   . CHF (congestive heart failure) ([place])   . COPD (chronic obstructive pulmonary disease) ([place])   . Depression   . Dyspnea   . Headache   . History of kidney stones   . History of shingles   . Hypertension   . Insomnia   . On home oxygen therapy    bedtime and prn  . Restless leg syndrome     Patient Active Problem List   Diagnosis Date Noted  . IFG (impaired fasting glucose) 04/01/2019  . Leg pain 07/23/2018  . 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/23/2018  . Chronic venous insufficiency 07/23/2018  . Simple endometrial hyperplasia without atypia 07/04/2018  . Epigastric pain 06/28/2018  . Acute appendicitis  02/17/2018  . Cervical radiculitis 05/17/2017  . Hypercholesteremia 05/02/2017  . Obesity (BMI 30-39.9) 05/02/2017  . Mitral valve insufficiency 04/16/2017  . Closed fracture of lateral malleolus 03/08/2017  . Chronic systolic congestive heart failure, NYHA class 3 ([place]) 08/07/2016  . NICM (nonischemic cardiomyopathy) ([place]) 08/07/2016  . Bundle branch block, left 10/02/2014  . Benign essential HTN 05/07/2014  . CAD (coronary artery disease) 05/07/2014  . COPD (chronic obstructive pulmonary disease) ([place]) 05/07/2014  . S/P cardiac catheterization 04/23/2014  . SOB (shortness of breath) on exertion 11/06/2013    Past Surgical History:  Procedure Laterality Date  . CARDIAC CATHETERIZATION  04/2014   Dr. Idelle Leech  . CARDIAC CATHETERIZATION    . CARDIAC DEFIBRILLATOR PLACEMENT    . CHOLECYSTECTOMY N/A 08/23/2016   Procedure: LAPAROSCOPIC CHOLECYSTECTOMY;  Surgeon: Clayburn Pert, MD;  Location: [facility] ORS;  Service: General;  Laterality: N/A;  . DILATATION & CURETTAGE/HYSTEROSCOPY WITH MYOSURE N/A 06/27/2018   Procedure: DILATATION & CURETTAGE/HYSTEROSCOPY WITH MYOSURE;  Surgeon: Malachy Mood, MD;  Location: [facility] ORS;  Service: Gynecology;  Laterality: N/A;  . HERNIA REPAIR    . LAPAROSCOPIC APPENDECTOMY N/A 02/17/2018   Procedure: APPENDECTOMY LAPAROSCOPIC;  Surgeon: Benjamine Sprague, DO;  Location: [facility] ORS;  Service: General;  Laterality: N/A;    Prior to Admission medications   Medication Sig Start Date End Date Taking? Authorizing Provider  albuterol (PROVENTIL HFA;VENTOLIN HFA) 108 (90 Base) MCG/ACT inhaler  Inhale 2 puffs into the lungs every 6 (six) hours as needed for wheezing or shortness of breath.    [provider]  aspirin EC 81 MG tablet Take 81 mg by mouth daily.    [provider]  atorvastatin (LIPITOR) 20 MG tablet TAKE 1 TABLET BY MOUTH ONCE DAILY 12/24/18   Volney American, PA-C  carvedilol (COREG) 25 MG tablet Take 25 mg by mouth 2 (two)  times daily. 04/12/17   [provider]  cyclobenzaprine (FLEXERIL) 5 MG tablet TAKE 1 TABLET BY MOUTH 3 TIMES DAILY AS NEEDED 04/04/19   Lucilla Lame, MD  doxycycline (VIBRAMYCIN) 100 MG capsule Take 1 capsule (100 mg total) by mouth 2 (two) times daily. 04/03/19   Kris Hartmann, NP  fluticasone (FLONASE) 50 MCG/ACT nasal spray Place 2 sprays into both nostrils daily. 07/26/18   Volney American, PA-C  furosemide (LASIX) 40 MG tablet TAKE 1 TABLET BY MOUTH ONCE DAILY 05/07/19   Volney American, PA-C  ibuprofen (ADVIL) 800 MG tablet Take 1 tablet (800 mg total) by mouth every 8 (eight) hours as needed for mild pain or moderate pain. Patient not taking: Reported on 04/30/2019 06/27/18   Malachy Mood, MD  loratadine (CLARITIN) 10 MG tablet Take 1 tablet (10 mg total) by mouth daily. 04/03/19   Kris Hartmann, NP  losartan (COZAAR) 50 MG tablet Take 50 mg by mouth daily. 04/10/17   [provider]  OXYGEN Inhale 3 L into the lungs 3 (three) times daily as needed (shortness of breath or coughing).    [provider]  pantoprazole (PROTONIX) 40 MG tablet TAKE 1 TABLET BY MOUTH ONCE DAILY 11/15/18   Volney American, PA-C  spironolactone (ALDACTONE) 25 MG tablet Take 12.5 mg by mouth daily. 04/09/17   [provider]  umeclidinium-vilanterol (ANORO ELLIPTA) 62.5-25 MCG/INH AEPB Inhale 1 puff into the lungs daily. 03/19/19   Volney American, PA-C    Allergies Levaquin [levofloxacin]; Amoxicillin; Nitrofuran derivatives; Penicillins; Zithromax [azithromycin]; and Antihistamines, chlorpheniramine-type  Family History  Problem Relation Age of Onset  . Diabetes Mellitus II Mother   . Hypertension Mother   . Heart attack Father   . Heart disease Brother   . Arthritis Sister   . Depression Daughter   . Depression Son   . Schizophrenia Son   . Arthritis Sister   . Diabetes Brother     Social History Social History   Tobacco Use  . Smoking  status: Former Smoker    Packs/day: 0.25    Types: Cigarettes  . Smokeless tobacco: Never Used  . Tobacco comment: quit at age 63, whole pack lasted 3 weeks   Vaping Use  . Vaping Use: Never used  Substance Use Topics  . Alcohol use: No    Alcohol/week: 0.0 standard drinks  . Drug use: No    Review of Systems  Constitutional: No fever/chills Eyes: No visual changes. ENT: No sore throat. Cardiovascular: Denies chest pain. Respiratory: Denies shortness of breath. Gastrointestinal: No abdominal pain.  No nausea, no vomiting.  No diarrhea.  No constipation. Genitourinary: Negative for dysuria. Musculoskeletal: Negative for back pain. Skin: Negative for rash.  Positive for bleeding varicose vein. Neurological: Negative for headaches, focal weakness or numbness.  ____________________________________________   PHYSICAL EXAM:  VITAL SIGNS: ED Triage Vitals  Enc Vitals Group     BP 06/12/19 2138 (!) 112/58     Pulse Rate 06/12/19 2138 83     Resp 06/12/19 2138  18     Temp 06/12/19 2138 97.9 F (36.6 C)     Temp Source 06/12/19 2138 Oral     SpO2 06/12/19 2138 95 %     Weight 06/12/19 2136 195 lb (88.5 kg)     Height 06/12/19 2136 5\' 4"  (1.626 m)     Head Circumference --      Peak Flow --      Pain Score 06/12/19 2136 0     Pain Loc --      Pain Edu? --      Excl. in [place]? --     Constitutional: Alert and oriented. Eyes: Conjunctivae are normal. Head: Atraumatic. Nose: No congestion/rhinnorhea. Mouth/Throat: Mucous membranes are moist. Neck: Normal ROM Cardiovascular: Normal rate, regular rhythm. Grossly normal heart sounds. Respiratory: Normal respiratory effort.  No retractions. Lungs CTAB. Gastrointestinal: Soft and nontender. No distention. Genitourinary: deferred Musculoskeletal: No lower extremity tenderness nor edema.  Large varicose veins to left lower extremity with area of dried blood to left medial ankle but no active bleeding.  2+ DP pulses  bilaterally. Neurologic:  Normal speech and language. No gross focal neurologic deficits are appreciated. Skin:  Skin is warm, dry and intact. No rash noted. Psychiatric: Mood and affect are normal. Speech and behavior are normal.  ____________________________________________   LABS (all labs ordered are listed, but only abnormal results are displayed)  Labs Reviewed - No data to display   PROCEDURES  Procedure(s) performed (including Critical Care):  Procedures   ____________________________________________   INITIAL IMPRESSION / ASSESSMENT AND PLAN / ED COURSE       69 year old female with history of CAD, COPD on chronic oxygen, hypertension, and CHF presents to the ED with bleeding from a varicose vein to her left ankle.  She applied a tea bag and EMS applied dressing prior to arrival and there has been no further bleeding.  She reports minimal blood loss and denies any symptoms of anemia.  Given resolution of bleeding, we will hold off on further intervention and she is appropriate for discharge home.  She was counseled to follow-up with vascular surgery and otherwise return to the ED for new or worsening symptoms.  Patient agrees with plan.      ____________________________________________   FINAL CLINICAL IMPRESSION(S) / ED DIAGNOSES  Final diagnoses:  Bleeding from varicose vein     ED Discharge Orders    None       Note:  This document was prepared using Dragon voice recognition software and may include unintentional dictation errors.   Blake Divine, MD 06/13/19 [phone]

## 2019-06-21 ENCOUNTER — Other Ambulatory Visit: Payer: Self-pay | Admitting: Family Medicine

## 2019-06-21 NOTE — Telephone Encounter (Signed)
Requested Prescriptions  Pending Prescriptions Disp Refills   pantoprazole (PROTONIX) 40 MG tablet [Pharmacy Med Name: PANTOPRAZOLE SODIUM 40 MG DR TAB] 90 tablet 0    Sig: TAKE 1 TABLET BY MOUTH ONCE DAILY     Gastroenterology: Proton Pump Inhibitors Passed - 06/21/2019  2:50 PM      Passed - Valid encounter within last 12 months    Recent Outpatient Visits          3 months ago Chronic systolic congestive heart failure, NYHA class 3 ([place])   [place], [place], [place]   3 months ago SOB (shortness of breath)   [facility], [place], [place]   4 months ago Rash and nonspecific skin eruption   [place], NP   6 months ago Sore throat   [place], [place], [place]   9 months ago Abdominal pain, unspecified abdominal location   [facility] Kathrine Haddock, NP

## 2019-06-30 ENCOUNTER — Encounter (INDEPENDENT_AMBULATORY_CARE_PROVIDER_SITE_OTHER): Payer: Self-pay | Admitting: Vascular Surgery

## 2019-06-30 ENCOUNTER — Other Ambulatory Visit: Payer: Self-pay

## 2019-06-30 ENCOUNTER — Ambulatory Visit (INDEPENDENT_AMBULATORY_CARE_PROVIDER_SITE_OTHER): Payer: Medicare Other | Admitting: Vascular Surgery

## 2019-06-30 VITALS — BP 100/66 | HR 80 | Resp 16 | Wt 175.6 lb

## 2019-06-30 DIAGNOSIS — I8312 Varicose veins of left lower extremity with inflammation: Secondary | ICD-10-CM | POA: Diagnosis not present

## 2019-06-30 DIAGNOSIS — I8311 Varicose veins of right lower extremity with inflammation: Secondary | ICD-10-CM | POA: Diagnosis not present

## 2019-07-02 ENCOUNTER — Encounter (INDEPENDENT_AMBULATORY_CARE_PROVIDER_SITE_OTHER): Payer: Self-pay | Admitting: Vascular Surgery

## 2019-07-02 NOTE — Progress Notes (Signed)
   Indication:  Patient presents with symptomatic varicose veins of the left lower extremity.  Procedure:  Sclerotherapy using 15 cc SDS foam mixed with 1% Lidocaine was performed on the left lower extremity.  Compression wraps were placed.  The patient tolerated the procedure well.  Plan:  Follow up as needed.

## 2019-07-28 ENCOUNTER — Other Ambulatory Visit: Payer: Self-pay | Admitting: Family Medicine

## 2019-07-28 ENCOUNTER — Telehealth: Payer: Self-pay | Admitting: Family Medicine

## 2019-07-28 NOTE — Telephone Encounter (Signed)
Pt received a new oxygen tank/concentrator/ but every time she gets them refilled they break and they have had to come out and fix it several times/ Pt wants to go back to her old concentrator due to disliking this one and the quality / please advise asap/ Pt stated she called and left a message about this a month ago

## 2019-07-29 ENCOUNTER — Ambulatory Visit (INDEPENDENT_AMBULATORY_CARE_PROVIDER_SITE_OTHER): Payer: Medicare Other | Admitting: Family Medicine

## 2019-07-29 ENCOUNTER — Other Ambulatory Visit: Payer: Self-pay

## 2019-07-29 ENCOUNTER — Encounter: Payer: Self-pay | Admitting: Family Medicine

## 2019-07-29 VITALS — BP 106/70 | HR 78 | Temp 97.6°F | Wt 176.0 lb

## 2019-07-29 DIAGNOSIS — G43009 Migraine without aura, not intractable, without status migrainosus: Secondary | ICD-10-CM | POA: Diagnosis not present

## 2019-07-29 DIAGNOSIS — J449 Chronic obstructive pulmonary disease, unspecified: Secondary | ICD-10-CM | POA: Diagnosis not present

## 2019-07-29 MED ORDER — ANORO ELLIPTA 62.5-25 MCG/INH IN AEPB: 1.0000 | INHALATION_SPRAY | Freq: Every day | RESPIRATORY_TRACT | 2 refills | Status: DC

## 2019-07-29 MED ORDER — GABAPENTIN 300 MG PO CAPS: 300.0000 mg | ORAL_CAPSULE | Freq: Three times a day (TID) | ORAL | 0 refills | Status: DC

## 2019-07-29 MED ORDER — ATORVASTATIN CALCIUM 20 MG PO TABS: 20.0000 mg | ORAL_TABLET | Freq: Every day | ORAL | 1 refills | Status: DC

## 2019-07-29 NOTE — Telephone Encounter (Signed)
Order generated and ready for signature

## 2019-07-29 NOTE — Telephone Encounter (Signed)
Please generate order for portable O2 - patient brought in provider who manages her O2 equipment info

## 2019-07-29 NOTE — Telephone Encounter (Signed)
Faxed
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
## 2019-08-02 NOTE — Assessment & Plan Note (Signed)
Fairly stable on anoro regimen, on O2 overnight. Will generate order for a portable O2 machine in case she needs to travel or sleep elsewhere at any time.

## 2019-09-04 ENCOUNTER — Ambulatory Visit (INDEPENDENT_AMBULATORY_CARE_PROVIDER_SITE_OTHER): Payer: Medicare Other | Admitting: Unknown Physician Specialty

## 2019-09-04 ENCOUNTER — Encounter: Payer: Self-pay | Admitting: Unknown Physician Specialty

## 2019-09-04 ENCOUNTER — Other Ambulatory Visit: Payer: Self-pay

## 2019-09-04 VITALS — BP 112/75 | HR 67 | Temp 97.9°F | Wt 176.0 lb

## 2019-09-04 DIAGNOSIS — I10 Essential (primary) hypertension: Secondary | ICD-10-CM | POA: Diagnosis not present

## 2019-09-04 DIAGNOSIS — E7800 Pure hypercholesterolemia, unspecified: Secondary | ICD-10-CM | POA: Diagnosis not present

## 2019-09-04 DIAGNOSIS — L509 Urticaria, unspecified: Secondary | ICD-10-CM

## 2019-09-04 DIAGNOSIS — N1831 Chronic kidney disease, stage 3a: Secondary | ICD-10-CM | POA: Insufficient documentation

## 2019-09-04 DIAGNOSIS — Z23 Encounter for immunization: Secondary | ICD-10-CM

## 2019-09-04 NOTE — Assessment & Plan Note (Signed)
Stable, continue present medications.   

## 2019-09-04 NOTE — Progress Notes (Signed)
BP 112/75   Pulse 67   Temp 97.9 F (36.6 C) (Oral)   Wt 176 lb (79.8 kg)   SpO2 91%   BMI 30.21 kg/m    Subjective:    Patient ID: Maria Neal, female    DOB: [birth year], 69 y.o.   MRN: [id number]  HPI: Maria Neal is a 69 y.o. female  Chief Complaint  Patient presents with  . Headache  . Hyperlipidemia  . Hypertension   Hypertension Using medications without difficulty Average home BPs not checking   No problems or lightheadedness No chest pain with exertion or shortness of breath No Edema   Hyperlipidemia Using medications without problems.  Last LDL was 23.   No Muscle aches  Diet compliance: Lost weight but now stabilized Exercise: Walks  Headache This is improved and not having a problem.  Ear pain behind right ear off and on.    Urticaria Given a steroid cream which did not helps.  Wondering if Patric Dykes is an option.    Relevant past medical, surgical, family and social history reviewed and updated as indicated. Interim medical history since our last visit reviewed. Allergies and medications reviewed and updated.  Review of Systems  Constitutional: Negative.   Respiratory: Negative.   Cardiovascular: Negative.   Psychiatric/Behavioral: Negative.     Per HPI unless specifically indicated above     Objective:    BP 112/75   Pulse 67   Temp 97.9 F (36.6 C) (Oral)   Wt 176 lb (79.8 kg)   SpO2 91%   BMI 30.21 kg/m   Wt Readings from Last 3 Encounters:  09/04/19 176 lb (79.8 kg)  07/29/19 176 lb (79.8 kg)  06/30/19 175 lb 9.6 oz (79.7 kg)    Physical Exam Constitutional:      General: She is not in acute distress.    Appearance: Normal appearance. She is well-developed.  HENT:     Head: Normocephalic and atraumatic.  Eyes:     General: Lids are normal. No scleral icterus.       Right eye: No discharge.        Left eye: No discharge.     Conjunctiva/sclera: Conjunctivae normal.  Neck:     Vascular: No carotid bruit or JVD.    Cardiovascular:     Rate and Rhythm: Normal rate and regular rhythm.     Heart sounds: Murmur heard.   Pulmonary:     Effort: Pulmonary effort is normal.     Breath sounds: Normal breath sounds.  Abdominal:     Palpations: There is no hepatomegaly or splenomegaly.  Musculoskeletal:        General: Normal range of motion.     Cervical back: Normal range of motion and neck supple.  Skin:    General: Skin is warm and dry.     Coloration: Skin is not pale.     Findings: No rash.  Neurological:     Mental Status: She is alert and oriented to person, place, and time.  Psychiatric:        Behavior: Behavior normal.        Thought Content: Thought content normal.        Judgment: Judgment normal.     Results for orders placed or performed during the hospital encounter of 05/01/19  Brain natriuretic peptide  Result Value Ref Range   B Natriuretic Peptide 4,012.0 (H) 0.0 - 100.0 pg/mL      Assessment & Plan:  Problem List Items Addressed This Visit      Unprioritized   Benign essential HTN (Chronic)    Stable, continue present medications.        Relevant Orders   Comprehensive metabolic panel   Chronic kidney disease, stage 3a    Last GFR was 50.  Recheck CMP today      Hypercholesteremia    Stable, continue present medications.         Other Visit Diagnoses    Flu vaccine need    -  Primary   Relevant Orders   Flu Vaccine QUAD High Dose(Fluad) (Completed)   Need for pneumococcal vaccine       Relevant Orders   Pneumococcal polysaccharide vaccine 23-valent greater than or equal to 2yo subcutaneous/IM (Completed)   Urticaria       Continue with steroid cream.  Take Clariton.  Cautioned about Benadryl       Follow up plan: Return in about 6 months (around 03/03/2020).

## 2019-09-04 NOTE — Assessment & Plan Note (Signed)
Last GFR was 50.  Recheck CMP today

## 2019-09-05 LAB — COMPREHENSIVE METABOLIC PANEL
ALT: 10 IU/L (ref 0–32)
AST: 19 IU/L (ref 0–40)
Albumin/Globulin Ratio: 1.5 (ref 1.2–2.2)
Albumin: 4.7 g/dL (ref 3.8–4.8)
Alkaline Phosphatase: 87 IU/L (ref 48–121)
BUN/Creatinine Ratio: 29 — ABNORMAL HIGH (ref 12–28)
BUN: 43 mg/dL — ABNORMAL HIGH (ref 8–27)
Bilirubin Total: 0.7 mg/dL (ref 0.0–1.2)
CO2: 30 mmol/L — ABNORMAL HIGH (ref 20–29)
Calcium: 10.1 mg/dL (ref 8.7–10.3)
Chloride: 96 mmol/L (ref 96–106)
Creatinine, Ser: 1.49 mg/dL — ABNORMAL HIGH (ref 0.57–1.00)
GFR calc Af Amer: 41 mL/min/{1.73_m2} — ABNORMAL LOW (ref >59)
GFR calc non Af Amer: 36 mL/min/{1.73_m2} — ABNORMAL LOW (ref >59)
Globulin, Total: 3.2 g/dL (ref 1.5–4.5)
Glucose: 106 mg/dL — ABNORMAL HIGH (ref 65–99)
Potassium: 5.3 mmol/L — ABNORMAL HIGH (ref 3.5–5.2)
Sodium: 139 mmol/L (ref 134–144)
Total Protein: 7.9 g/dL (ref 6.0–8.5)

## 2019-09-11 ENCOUNTER — Other Ambulatory Visit: Payer: Self-pay | Admitting: Unknown Physician Specialty

## 2019-09-11 DIAGNOSIS — N1832 Chronic kidney disease, stage 3b: Secondary | ICD-10-CM

## 2019-09-19 ENCOUNTER — Other Ambulatory Visit: Payer: Self-pay

## 2019-09-19 ENCOUNTER — Ambulatory Visit (INDEPENDENT_AMBULATORY_CARE_PROVIDER_SITE_OTHER): Payer: Medicare Other

## 2019-09-19 ENCOUNTER — Ambulatory Visit
Admission: EM | Admit: 2019-09-19 | Discharge: 2019-09-19 | Disposition: A | Discharge status: Home / Self Care | Payer: Medicare Other | Attending: Physician Assistant | Admitting: Physician Assistant

## 2019-09-19 ENCOUNTER — Encounter: Payer: Self-pay | Admitting: Emergency Medicine

## 2019-09-19 DIAGNOSIS — M79671 Pain in right foot: Secondary | ICD-10-CM

## 2019-09-19 DIAGNOSIS — M722 Plantar fascial fibromatosis: Secondary | ICD-10-CM

## 2019-09-19 DIAGNOSIS — M7989 Other specified soft tissue disorders: Secondary | ICD-10-CM

## 2019-09-19 MED ORDER — TRAMADOL HCL 50 MG PO TABS: 50.0000 mg | ORAL_TABLET | Freq: Four times a day (QID) | ORAL | 0 refills | Status: AC | PRN

## 2019-09-19 MED ORDER — DICLOFENAC SODIUM 1 % EX GEL: 2.0000 g | Freq: Four times a day (QID) | CUTANEOUS | 1 refills | Status: AC

## 2019-09-19 NOTE — ED Triage Notes (Signed)
Pt c/o right foot pain. Pain is located on the bottom of the foot and top. No known injury recently but has broken this foot in the past. Started yesterday but worse today. She has tried rubbing alcohol and topical pain relief cream.

## 2019-09-19 NOTE — Discharge Instructions (Signed)
Images are normal today.  I suspect that you have plantar fasciitis.  Take pain medicine as prescribed as needed.  You can also apply topical Voltaren gel as directed.  You are advised to elevate and ice the foot to help with swelling.  You can also wear the Ace wrap.  Consider rolling foot over ball and stretching the foot. If you notice any redness or warmth or excessive pain of the foot you should be seen again to ensure there are no clots since you do have venous disease.  If you are not getting better over the next week you should consider following up with Ortho.  You have a condition requiring you to follow up with Orthopedics so please call one of the following office for appointment:   [facility] [address] [place], [place] [address] Phone: [phone]  [facility] [address] [place], [place] [address] Phone: [phone]

## 2019-09-19 NOTE — ED Provider Notes (Signed)
[facility]    CSN: [id number] Arrival date & time: 09/19/19  1033      History   Chief Complaint Chief Complaint  Patient presents with  . Foot Pain    right    HPI Maria Neal is a 69 y.o. female.   Patient presenting with complaints of right foot pain.  She says that the pain is on the bottom of the foot as well as the dorsal aspect.  She denies any current known injury.  Patient states she has fractured the foot in the past.  Symptoms started yesterday, but have worsened today.  She says it hurts to put weight on the foot.  She states that she has tried rubbing alcohol and topical pain relief creams without any relief of the pain.  She has also tried Tylenol without relief.  She denies any fever, redness, numbness or tingling.  Patient does have history of varicose veins and spider veins of the lower extremities.  She sees a vein specialist for this.  No other concerns today.  HPI  Past Medical History:  Diagnosis Date  . AICD (automatic cardioverter/defibrillator) present   . Anxiety   . CAD (coronary artery disease)   . Cardiomyopathy ([place])   . CHF (congestive heart failure) ([place])   . COPD (chronic obstructive pulmonary disease) ([place])   . Depression   . Dyspnea   . Headache   . History of kidney stones   . History of shingles   . Hypertension   . Insomnia   . On home oxygen therapy    bedtime and prn  . Restless leg syndrome     Patient Active Problem List   Diagnosis Date Noted  . Chronic kidney disease, stage 3a 09/04/2019  . IFG (impaired fasting glucose) 04/01/2019  . Leg pain 07/23/2018  . 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/23/2018  . Chronic venous insufficiency 07/23/2018  . Simple endometrial hyperplasia without atypia 07/04/2018  . Epigastric pain 06/28/2018  . Acute appendicitis 02/17/2018  . Cervical radiculitis 05/17/2017  . Hypercholesteremia 05/02/2017  . Obesity (BMI 30-39.9) 05/02/2017  . Mitral valve  insufficiency 04/16/2017  . Closed fracture of lateral malleolus 03/08/2017  . Chronic systolic congestive heart failure, NYHA class 3 ([place]) 08/07/2016  . NICM (nonischemic cardiomyopathy) ([place]) 08/07/2016  . Bundle branch block, left 10/02/2014  . Benign essential HTN 05/07/2014  . CAD (coronary artery disease) 05/07/2014  . COPD (chronic obstructive pulmonary disease) ([place]) 05/07/2014  . S/P cardiac catheterization 04/23/2014  . SOB (shortness of breath) on exertion 11/06/2013    Past Surgical History:  Procedure Laterality Date  . CARDIAC CATHETERIZATION  04/2014   Dr. Idelle Leech  . CARDIAC CATHETERIZATION    . CARDIAC DEFIBRILLATOR PLACEMENT    . CHOLECYSTECTOMY N/A 08/23/2016   Procedure: LAPAROSCOPIC CHOLECYSTECTOMY;  Surgeon: Clayburn Pert, MD;  Location: [facility] ORS;  Service: General;  Laterality: N/A;  . DILATATION & CURETTAGE/HYSTEROSCOPY WITH MYOSURE N/A 06/27/2018   Procedure: DILATATION & CURETTAGE/HYSTEROSCOPY WITH MYOSURE;  Surgeon: Malachy Mood, MD;  Location: [facility] ORS;  Service: Gynecology;  Laterality: N/A;  . HERNIA REPAIR    . LAPAROSCOPIC APPENDECTOMY N/A 02/17/2018   Procedure: APPENDECTOMY LAPAROSCOPIC;  Surgeon: Benjamine Sprague, DO;  Location: [facility] ORS;  Service: General;  Laterality: N/A;    OB History   No obstetric history on file.      Home Medications    Prior to Admission medications   Medication Sig Start Date End Date Taking? Authorizing  Provider  albuterol (PROVENTIL HFA;VENTOLIN HFA) 108 (90 Base) MCG/ACT inhaler Inhale 2 puffs into the lungs every 6 (six) hours as needed for wheezing or shortness of breath.   Yes [provider]  aspirin EC 81 MG tablet Take 81 mg by mouth daily.   Yes [provider]  atorvastatin (LIPITOR) 20 MG tablet Take 1 tablet (20 mg total) by mouth daily. 07/29/19  Yes Volney American, PA-C  carvedilol (COREG) 25 MG tablet Take 25 mg by mouth 2 (two) times daily. 04/12/17  Yes [provider]  cyclobenzaprine (FLEXERIL) 5 MG tablet TAKE 1 TABLET BY MOUTH 3 TIMES DAILY AS NEEDED 04/04/19  Yes Lucilla Lame, MD  fluticasone (FLONASE) 50 MCG/ACT nasal spray Place 2 sprays into both nostrils daily. 07/26/18  Yes Volney American, PA-C  furosemide (LASIX) 40 MG tablet TAKE 1 TABLET BY MOUTH ONCE DAILY 05/07/19  Yes Volney American, PA-C  ibuprofen (ADVIL) 800 MG tablet Take 1 tablet (800 mg total) by mouth every 8 (eight) hours as needed for mild pain or moderate pain. 06/27/18  Yes Malachy Mood, MD  loratadine (CLARITIN) 10 MG tablet Take 1 tablet (10 mg total) by mouth daily. 04/03/19  Yes Kris Hartmann, NP  losartan (COZAAR) 50 MG tablet Take 50 mg by mouth daily. 04/10/17  Yes [provider]  OXYGEN Inhale 3 L into the lungs 3 (three) times daily as needed (shortness of breath or coughing).   Yes [provider]  spironolactone (ALDACTONE) 25 MG tablet Take 12.5 mg by mouth daily. 04/09/17  Yes [provider]  umeclidinium-vilanterol (ANORO ELLIPTA) 62.5-25 MCG/INH AEPB Inhale 1 puff into the lungs daily. 07/29/19  Yes Volney American, PA-C  diclofenac Sodium (VOLTAREN) 1 % GEL Apply 2 g topically 4 (four) times daily for 7 days. 09/19/19 09/26/19  Laurene Footman B, PA-C  gabapentin (NEURONTIN) 300 MG capsule Take 1 capsule (300 mg total) by mouth 3 (three) times daily. 07/29/19   Volney American, PA-C  traMADol (ULTRAM) 50 MG tablet Take 1 tablet (50 mg total) by mouth every 6 (six) hours as needed for up to 5 days. 09/19/19 09/24/19  Danton Clap, PA-C    Family History Family History  Problem Relation Age of Onset  . Diabetes Mellitus II Mother   . Hypertension Mother   . Heart attack Father   . Heart disease Brother   . Arthritis Sister   . Depression Daughter   . Depression Son   . Schizophrenia Son   . Arthritis Sister   . Diabetes Brother     Social History Social History   Tobacco Use  . Smoking status:  Former Smoker    Packs/day: 0.25    Types: Cigarettes  . Smokeless tobacco: Never Used  . Tobacco comment: quit at age 46, whole pack lasted 3 weeks   Vaping Use  . Vaping Use: Never used  Substance Use Topics  . Alcohol use: No    Alcohol/week: 0.0 standard drinks  . Drug use: No     Allergies   Levaquin [levofloxacin]; Amoxicillin; Nitrofuran derivatives; Penicillins; Zithromax [azithromycin]; and Antihistamines, chlorpheniramine-type   Review of Systems Review of Systems  Constitutional: Negative for fatigue and fever.  Musculoskeletal: Positive for arthralgias. Negative for gait problem, joint swelling and myalgias.  Skin: Negative for color change, rash and wound.  Neurological: Negative for weakness and numbness.     Physical Exam Triage Vital Signs ED Triage Vitals  Enc Vitals  Group     BP 09/19/19 1118 117/70     Pulse Rate 09/19/19 1118 83     Resp 09/19/19 1118 18     Temp 09/19/19 1118 98.5 F (36.9 C)     Temp Source 09/19/19 1118 Oral     SpO2 09/19/19 1118 94 %     Weight 09/19/19 1114 175 lb 14.8 oz (79.8 kg)     Height 09/19/19 1114 5\' 4"  (1.626 m)     Head Circumference --      Peak Flow --      Pain Score 09/19/19 1114 8     Pain Loc --      Pain Edu? --      Excl. in [place]? --    No data found.  Updated Vital Signs BP 117/70 (BP Location: Right Arm)   Pulse 83   Temp 98.5 F (36.9 C) (Oral)   Resp 18   Ht 5\' 4"  (1.626 m)   Wt 175 lb 14.8 oz (79.8 kg)   SpO2 94% Comment: pt states her O2 stat stay from 90-92%  BMI 30.20 kg/m      Physical Exam Vitals and nursing note reviewed.  Constitutional:      General: She is not in acute distress.    Appearance: Normal appearance. She is not ill-appearing or toxic-appearing.  HENT:     Head: Normocephalic and atraumatic.  Eyes:     General: No scleral icterus.       Right eye: No discharge.        Left eye: No discharge.     Conjunctiva/sclera: Conjunctivae normal.  Cardiovascular:      Rate and Rhythm: Normal rate.     Pulses: Normal pulses.  Pulmonary:     Effort: Pulmonary effort is normal. No respiratory distress.  Musculoskeletal:     Cervical back: Neck supple.     Right foot: Decreased range of motion. Swelling (diffuse moderate swelling dorsal midfoot and forefoot. No associated erythema or warmth) and tenderness (diffuse TTP dorsal mid foot as well as plantar fascia ) present. No bony tenderness. Normal pulse.     Left foot: Normal pulse.  Skin:    General: Skin is dry.     Findings: No rash.  Neurological:     General: No focal deficit present.     Mental Status: She is alert. Mental status is at baseline.     Motor: No weakness.     Gait: Gait abnormal.  Psychiatric:        Mood and Affect: Mood normal.        Behavior: Behavior normal.        Thought Content: Thought content normal.      UC Treatments / Results  Labs (all labs ordered are listed, but only abnormal results are displayed) Labs Reviewed - No data to display  EKG   Radiology DG Foot Complete Right  Result Date: 09/19/2019 CLINICAL DATA:  Acute right foot pain but no known injury. EXAM: RIGHT FOOT COMPLETE - 3+ VIEW COMPARISON:  None. FINDINGS: There is no evidence of fracture or dislocation. There is no evidence of arthropathy or other focal bone abnormality. Soft tissues are unremarkable. IMPRESSION: Negative. Electronically Signed   By: Marijo Conception M.D.   On: 09/19/2019 11:52    Procedures Procedures (including critical care time)  Medications Ordered in UC Medications - No data to display  Initial Impression / Assessment and Plan / UC Course  I have  reviewed the triage vital signs and the nursing notes.  Pertinent labs & imaging results that were available during my care of the patient were reviewed by me and considered in my medical decision making (see chart for details).    Final Clinical Impressions(s) / UC Diagnoses   Final diagnoses:  Foot pain, right   Plantar fasciitis  Swelling of right foot     Discharge Instructions     Images are normal today.  I suspect that you have plantar fasciitis.  Take pain medicine as prescribed as needed.  You can also apply topical Voltaren gel as directed.  You are advised to elevate and ice the foot to help with swelling.  You can also wear the Ace wrap.  Consider rolling foot over ball and stretching the foot. If you notice any redness or warmth or excessive pain of the foot you should be seen again to ensure there are no clots since you do have venous disease.  If you are not getting better over the next week you should consider following up with Ortho.  You have a condition requiring you to follow up with Orthopedics so please call one of the following office for appointment:   [facility] [address] [place], [place] [address] Phone: [phone]  [facility] [address], [place], [place] [address] Phone: [phone]     ED Prescriptions    Medication Sig Dispense Auth. Provider   diclofenac Sodium (VOLTAREN) 1 % GEL Apply 2 g topically 4 (four) times daily for 7 days. 50 g Laurene Footman B, PA-C   traMADol (ULTRAM) 50 MG tablet Take 1 tablet (50 mg total) by mouth every 6 (six) hours as needed for up to 5 days. 10 tablet Danton Clap, PA-C     I have reviewed the PDMP during this encounter.   Danton Clap, PA-C 09/19/19 1216

## 2019-10-01 ENCOUNTER — Other Ambulatory Visit: Payer: Self-pay | Admitting: Unknown Physician Specialty

## 2019-10-01 DIAGNOSIS — N1832 Chronic kidney disease, stage 3b: Secondary | ICD-10-CM

## 2019-10-03 ENCOUNTER — Other Ambulatory Visit: Payer: Self-pay | Admitting: Nephrology

## 2019-10-03 DIAGNOSIS — N1831 Chronic kidney disease, stage 3a: Secondary | ICD-10-CM

## 2019-10-03 DIAGNOSIS — N179 Acute kidney failure, unspecified: Secondary | ICD-10-CM

## 2019-10-14 ENCOUNTER — Ambulatory Visit: Payer: Self-pay | Admitting: Family Medicine

## 2019-10-14 NOTE — Telephone Encounter (Signed)
Pt's daughter '[name]' calling. Initially told by agent pt was present as well; daughter not with patient. Reported pt having "Black outs." States was with pt today and "She just got real still with eyes closed for 30 seconds and didn't say anything when I touched her." States pt reported this has happened several times past 2 weeks. Advised NT would need to speak with pt. Advised to call mother to alert her nurse would be calling and to answer phone.  Attempted x 3 to reach pt, line busy. Attempted to CB daughter, busy signal x 2 attempts. Will continue attempts.

## 2019-10-14 NOTE — Telephone Encounter (Signed)
Attempted to reach both pt and daughter again at 16. Lines busy.

## 2019-10-15 NOTE — Telephone Encounter (Signed)
Spoke with the patient's daughter, Maria Neal. She reports Maria Neal is feeling better, tolerating liquids and small amounts of food. Voided without difficulty several times today.No additional vomiting reported. No fever and no pain.She has not had any of the black out episodes today and is alert.  Care Advice includes sips of liquids hourly, advance to more solids as tolerated. If the patient should feel worse or appear to have a blackout episode call or transport to the ED. Call back to schedule a follow-up appointment if needed.

## 2019-10-15 NOTE — Telephone Encounter (Signed)
Maria Neal reports Maria Neal is still not feeling well. Patient C/O abdominal pain today and has vomited once this morning. Patient reports she has only had her medications this morning with milk. She reports that she has been taking meds with milk for a while now. Patient denies any fever, cough, runny nose, sore throat or ear pain. Maria Neal did check temp and it was 97.2. Maria Neal also reports that patient fell about a week ago at home. Patient reports he tripped on carpet, daughter not sure if patient had blacked out at the time. Maria Neal reports patient did have a big knot on her forehead above her eye. Patient was not evaluated by any one after the fall. Please advise.

## 2019-10-15 NOTE — Telephone Encounter (Signed)
Please triage if able. Patient likely needs to go to ER as I do not have an availability today.

## 2019-10-16 ENCOUNTER — Ambulatory Visit (INDEPENDENT_AMBULATORY_CARE_PROVIDER_SITE_OTHER): Payer: Medicare Other | Admitting: Vascular Surgery

## 2019-10-24 ENCOUNTER — Other Ambulatory Visit: Payer: Self-pay | Admitting: Family Medicine

## 2019-10-24 NOTE — Telephone Encounter (Signed)
Requested medication (s) are due for refill today - unsure- Rx filled with no RF  Requested medication (s) are on the active medication list -yes  Future visit scheduled -yes  Last refill: 07/29/19  Notes to clinic: Request RF on Rx filled with 0 RF- sent for review   Requested Prescriptions  Pending Prescriptions Disp Refills   gabapentin (NEURONTIN) 300 MG capsule [Pharmacy Med Name: GABAPENTIN 300 MG CAP] 90 capsule 0    Sig: TAKE 1 CAPSULE BY MOUTH 3 TIMES DAILY      Neurology: Anticonvulsants - gabapentin Passed - 10/24/2019  1:41 PM      Passed - Valid encounter within last 12 months    Recent Outpatient Visits           1 month ago Flu vaccine need   [facility] Kathrine Haddock, NP   2 months ago Migraine without aura and without status migrainosus, not intractable   [facility] Volney American, [place]   7 months ago Chronic systolic congestive heart failure, NYHA class 3 ([place])   [place], [place], [place]   7 months ago SOB (shortness of breath)   [facility], [place], [place]   8 months ago Rash and nonspecific skin eruption   [facility] Eulogio Bear, NP       Future Appointments             In 4 months Wynetta Emery, Barb Merino, DO [facility], [place]                Requested Prescriptions  Pending Prescriptions Disp Refills   gabapentin (NEURONTIN) 300 MG capsule [Pharmacy Med Name: GABAPENTIN 300 MG CAP] 90 capsule 0    Sig: TAKE 1 CAPSULE BY MOUTH 3 TIMES DAILY      Neurology: Anticonvulsants - gabapentin Passed - 10/24/2019  1:41 PM      Passed - Valid encounter within last 12 months    Recent Outpatient Visits           1 month ago Flu vaccine need   [facility] Kathrine Haddock, NP   2 months ago Migraine without aura and without status migrainosus, not intractable   [place], [place]   7  months ago Chronic systolic congestive heart failure, NYHA class 3 [facility])   [place], [place]   7 months ago SOB (shortness of breath)   [place], [place]   8 months ago Rash and nonspecific skin eruption   [facility] Eulogio Bear, NP       Future Appointments             In 4 months Wynetta Emery, Barb Merino, DO [employer], [facility]

## 2019-10-27 ENCOUNTER — Other Ambulatory Visit: Payer: Self-pay | Admitting: Family Medicine

## 2019-10-31 ENCOUNTER — Telehealth: Payer: Self-pay

## 2019-10-31 NOTE — Telephone Encounter (Signed)
According to chart, patient is UTD on all vaccines. Called and notified patient's daughter.

## 2019-10-31 NOTE — Telephone Encounter (Signed)
Copied from [place] [id number]. Topic: General - Other >> Oct 31, 2019 10:09 AM Rainey Pines A wrote: Patients daughter would like to confirm if patient is due for her pneumonia shot or any other shots at this time.

## 2019-11-10 ENCOUNTER — Other Ambulatory Visit: Payer: Self-pay | Admitting: Family Medicine

## 2019-12-09 ENCOUNTER — Other Ambulatory Visit: Payer: Self-pay | Admitting: Family Medicine

## 2019-12-16 ENCOUNTER — Telehealth: Payer: Self-pay | Admitting: Family Medicine

## 2019-12-16 ENCOUNTER — Other Ambulatory Visit: Payer: Self-pay | Admitting: Nurse Practitioner

## 2019-12-16 MED ORDER — PANTOPRAZOLE SODIUM 40 MG PO TBEC
40.0000 mg | DELAYED_RELEASE_TABLET | Freq: Every day | ORAL | 0 refills | Status: DC
Start: 2019-12-16 — End: 2020-03-10

## 2019-12-16 NOTE — Telephone Encounter (Signed)
Sent in medication, is on past medication list and she has taken with benefit in past.

## 2019-12-16 NOTE — Telephone Encounter (Signed)
Medication been requested not on current list

## 2019-12-16 NOTE — Telephone Encounter (Signed)
Routing to provider  

## 2019-12-16 NOTE — Telephone Encounter (Signed)
Copied from [place] [id number]. Topic: Quick Communication - Rx Refill/Question >> Dec 16, 2019 10:06 AM Leward Quan A wrote: Medication: pantoprazole (PROTONIX) 40 MG tablet  Has the patient contacted their pharmacy? Yes.   (Agent: If no, request that the patient contact the pharmacy for the refill.) (Agent: If yes, when and what did the pharmacy advise?)  Preferred Pharmacy (with phone number or street name): [facility] [place], [place].  Phone:  [phone] Fax:  [phone]     Agent: Please be advised that RX refills may take up to 3 business days. We ask that you follow-up with your pharmacy.

## 2020-03-03 ENCOUNTER — Ambulatory Visit: Payer: Medicare Other | Admitting: Family Medicine

## 2020-03-10 ENCOUNTER — Other Ambulatory Visit: Payer: Self-pay

## 2020-03-10 ENCOUNTER — Encounter: Payer: Self-pay | Admitting: Nurse Practitioner

## 2020-03-10 ENCOUNTER — Ambulatory Visit (INDEPENDENT_AMBULATORY_CARE_PROVIDER_SITE_OTHER): Payer: Medicare Other | Admitting: Nurse Practitioner

## 2020-03-10 VITALS — BP 118/71 | HR 82 | Temp 98.3°F | Ht 61.7 in | Wt 179.6 lb

## 2020-03-10 DIAGNOSIS — J449 Chronic obstructive pulmonary disease, unspecified: Secondary | ICD-10-CM

## 2020-03-10 DIAGNOSIS — R7301 Impaired fasting glucose: Secondary | ICD-10-CM

## 2020-03-10 DIAGNOSIS — E7800 Pure hypercholesterolemia, unspecified: Secondary | ICD-10-CM | POA: Diagnosis not present

## 2020-03-10 DIAGNOSIS — I25118 Atherosclerotic heart disease of native coronary artery with other forms of angina pectoris: Secondary | ICD-10-CM | POA: Diagnosis not present

## 2020-03-10 DIAGNOSIS — I872 Venous insufficiency (chronic) (peripheral): Secondary | ICD-10-CM

## 2020-03-10 DIAGNOSIS — N1831 Chronic kidney disease, stage 3a: Secondary | ICD-10-CM | POA: Diagnosis not present

## 2020-03-10 DIAGNOSIS — L509 Urticaria, unspecified: Secondary | ICD-10-CM

## 2020-03-10 DIAGNOSIS — I447 Left bundle-branch block, unspecified: Secondary | ICD-10-CM

## 2020-03-10 DIAGNOSIS — Z1231 Encounter for screening mammogram for malignant neoplasm of breast: Secondary | ICD-10-CM | POA: Diagnosis not present

## 2020-03-10 DIAGNOSIS — I10 Essential (primary) hypertension: Secondary | ICD-10-CM

## 2020-03-10 DIAGNOSIS — I5022 Chronic systolic (congestive) heart failure: Secondary | ICD-10-CM

## 2020-03-10 DIAGNOSIS — Z1211 Encounter for screening for malignant neoplasm of colon: Secondary | ICD-10-CM | POA: Diagnosis not present

## 2020-03-10 DIAGNOSIS — J9690 Respiratory failure, unspecified, unspecified whether with hypoxia or hypercapnia: Secondary | ICD-10-CM | POA: Diagnosis not present

## 2020-03-10 MED ORDER — FUROSEMIDE 40 MG PO TABS: 40.0000 mg | ORAL_TABLET | Freq: Every day | ORAL | 1 refills | Status: DC

## 2020-03-10 MED ORDER — TRIAMCINOLONE ACETONIDE 0.025 % EX CREA: TOPICAL_CREAM | Freq: Two times a day (BID) | CUTANEOUS | Status: DC

## 2020-03-10 MED ORDER — TRIAMCINOLONE ACETONIDE 0.025 % EX CREA: 1.0000 "application " | TOPICAL_CREAM | Freq: Two times a day (BID) | CUTANEOUS | 0 refills | Status: DC

## 2020-03-10 NOTE — Assessment & Plan Note (Addendum)
Stable.  Discussed diet modifications.  Labs ordered today. Will adjust regimen as needed.

## 2020-03-10 NOTE — Assessment & Plan Note (Signed)
Stable, continue present medications. Followed by Cardiology. Labs ordered today.

## 2020-03-10 NOTE — Progress Notes (Signed)
BP 118/71   Pulse 82   Temp 98.3 F (36.8 C) (Oral)   Ht 5' 1.7" (1.567 m)   Wt 179 lb 9.6 oz (81.5 kg)   SpO2 92%   BMI 33.17 kg/m    Subjective:    Patient ID: Maria Neal, female    DOB: [birth year], 70 y.o.   MRN: [id number]  HPI: Maria Neal is a 71 y.o. female  Chief Complaint  Patient presents with  . Hyperlipidemia  . Hypertension   HYPERTENSION / [place] Satisfied with current treatment? yes Duration of hypertension: years BP monitoring frequency: not checking BP range:  BP medication side effects: no Past BP meds: carvedilol, losartan (cozaar) and spironalactone Duration of hyperlipidemia: years Cholesterol medication side effects: no Cholesterol supplements: none Past cholesterol medications: atorvastain (lipitor) Medication compliance: excellent compliance Aspirin: yes Recent stressors: no Recurrent headaches: no Visual changes: no Palpitations: no Dyspnea: yes Chest pain: no Lower extremity edema: no Dizzy/lightheaded: no  COPD O2 is 90% at home. COPD status: stable Satisfied with current treatment?: yes Oxygen use: yes Dyspnea frequency:  When she walks Cough frequency: no Rescue inhaler frequency:  3x weeks Limitation of activity: no Productive cough: no Last Spirometry:  Pneumovax: Up to Date Influenza: Up to Date  CORONARY ARTERY DISEASE/ HEART FAILURE/ LBBB Patient follows up with Cardiology for management of CAD, CHF, and her LBBB.  Last ECHO was in May 2021 and EF was 25%.  Sleeps on 3 pillows at baseline at night.  COPD Patient is on 3L at baseline when she needs it but mostly at night.  She was a smoker but quit 10 years ago.   Relevant past medical, surgical, family and social history reviewed and updated as indicated. Interim medical history since our last visit reviewed. Allergies and medications reviewed and updated.  Review of Systems  Eyes: Negative for visual disturbance.  Respiratory: Positive for shortness  of breath. Negative for cough and chest tightness.   Cardiovascular: Negative for chest pain, palpitations and leg swelling.  Neurological: Negative for dizziness and headaches.    Per HPI unless specifically indicated above     Objective:    BP 118/71   Pulse 82   Temp 98.3 F (36.8 C) (Oral)   Ht 5' 1.7" (1.567 m)   Wt 179 lb 9.6 oz (81.5 kg)   SpO2 92%   BMI 33.17 kg/m   Wt Readings from Last 3 Encounters:  03/10/20 179 lb 9.6 oz (81.5 kg)  09/19/19 175 lb 14.8 oz (79.8 kg)  09/04/19 176 lb (79.8 kg)    Physical Exam Vitals and nursing note reviewed.  Constitutional:      General: She is not in acute distress.    Appearance: Normal appearance. She is normal weight. She is not ill-appearing, toxic-appearing or diaphoretic.  HENT:     Head: Normocephalic.     Right Ear: External ear normal.     Left Ear: External ear normal.     Nose: Nose normal.     Mouth/Throat:     Mouth: Mucous membranes are moist.     Pharynx: Oropharynx is clear.  Eyes:     General:        Right eye: No discharge.        Left eye: No discharge.     Extraocular Movements: Extraocular movements intact.     Conjunctiva/sclera: Conjunctivae normal.     Pupils: Pupils are equal, round, and reactive to light.  Cardiovascular:  Rate and Rhythm: Normal rate and regular rhythm.     Heart sounds: Murmur heard.    Pulmonary:     Effort: Pulmonary effort is normal. No respiratory distress.     Breath sounds: Decreased air movement present. Wheezing present. No rales.  Musculoskeletal:     Cervical back: Normal range of motion and neck supple.  Skin:    General: Skin is warm and dry.     Capillary Refill: Capillary refill takes less than 2 seconds.  Neurological:     General: No focal deficit present.     Mental Status: She is alert and oriented to person, place, and time. Mental status is at baseline.  Psychiatric:        Mood and Affect: Mood normal.        Behavior: Behavior normal.         Thought Content: Thought content normal.        Judgment: Judgment normal.     Results for orders placed or performed in visit on 09/04/19  Comprehensive metabolic panel  Result Value Ref Range   Glucose 106 (H) 65 - 99 mg/dL   BUN 43 (H) 8 - 27 mg/dL   Creatinine, Ser 1.49 (H) 0.57 - 1.00 mg/dL   GFR calc non Af Amer 36 (L) >59 mL/min/1.73   GFR calc Af Amer 41 (L) >59 mL/min/1.73   BUN/Creatinine Ratio 29 (H) 12 - 28   Sodium 139 134 - 144 mmol/L   Potassium 5.3 (H) 3.5 - 5.2 mmol/L   Chloride 96 96 - 106 mmol/L   CO2 30 (H) 20 - 29 mmol/L   Calcium 10.1 8.7 - 10.3 mg/dL   Total Protein 7.9 6.0 - 8.5 g/dL   Albumin 4.7 3.8 - 4.8 g/dL   Globulin, Total 3.2 1.5 - 4.5 g/dL   Albumin/Globulin Ratio 1.5 1.2 - 2.2   Bilirubin Total 0.7 0.0 - 1.2 mg/dL   Alkaline Phosphatase 87 48 - 121 IU/L   AST 19 0 - 40 IU/L   ALT 10 0 - 32 IU/L      Assessment & Plan:   Problem List Items Addressed This Visit      Cardiovascular and Mediastinum   Benign essential HTN - Primary (Chronic)    Stable, continue present medications. Followed by Cardiology. Labs ordered today.        Relevant Medications   furosemide (LASIX) 40 MG tablet   CAD (coronary artery disease) (Chronic)    Stable, continue present medications. Followed by Cardiology. Labs ordered today.       Relevant Medications   furosemide (LASIX) 40 MG tablet   Bundle branch block, left    Stable, continue present medications. Followed by Cardiology. Labs ordered today.       Relevant Medications   furosemide (LASIX) 40 MG tablet   Chronic systolic congestive heart failure, NYHA class 3 (HCC)    Stable, continue present medications. Followed by Cardiology. Labs ordered today.       Relevant Medications   furosemide (LASIX) 40 MG tablet   Chronic venous insufficiency    Stable, continue present medications. Followed by Cardiology. Labs ordered today.       Relevant Medications   furosemide (LASIX) 40 MG tablet      Respiratory   COPD (chronic obstructive pulmonary disease) (HCC) (Chronic)    Stable, continue present medications. Followed by Pulmonology. Labs ordered today.       Relevant Medications   Fluticasone-Umeclidin-Vilant (TRELEGY ELLIPTA) 100-62.5-25 MCG/INH  AEPB     Endocrine   IFG (impaired fasting glucose)    Stable.  Discussed diet modifications.  Labs ordered today. Will adjust regimen as needed.       Relevant Orders   HgB A1c     Genitourinary   Chronic kidney disease, stage 3a (HCC)    Chronic.  Ongoing.  Discussed with patient that she needs to avoid NSAIDS. Lab work ordered today.  Followed by Nephrology.       Relevant Orders   Comp Met (CMET)     Other   Hypercholesteremia    Stable, continue present medications. Followed by Cardiology. Labs ordered today.       Relevant Medications   furosemide (LASIX) 40 MG tablet   Other Relevant Orders   Lipid Profile    Other Visit Diagnoses    Screening for colon cancer       Relevant Orders   Cologuard   Encounter for screening mammogram for malignant neoplasm of breast       Relevant Orders   MM Digital Screening   Urticaria           Follow up plan: Return in about 6 months (around 09/10/2020) for Physical and Fasting labs.

## 2020-03-10 NOTE — Assessment & Plan Note (Addendum)
Chronic.  Ongoing.  Discussed with patient that she needs to avoid NSAIDS. Lab work ordered today.  Followed by Nephrology.

## 2020-03-10 NOTE — Assessment & Plan Note (Signed)
Stable, continue present medications. Followed by Pulmonology. Labs ordered today.

## 2020-03-11 LAB — LIPID PANEL
Chol/HDL Ratio: 4.1 ratio (ref 0.0–4.4)
Cholesterol, Total: 202 mg/dL — ABNORMAL HIGH (ref 100–199)
HDL: 49 mg/dL (ref >39)
LDL Chol Calc (NIH): 128 mg/dL — ABNORMAL HIGH (ref 0–99)
Triglycerides: 140 mg/dL (ref 0–149)
VLDL Cholesterol Cal: 25 mg/dL (ref 5–40)

## 2020-03-11 LAB — COMPREHENSIVE METABOLIC PANEL
ALT: 14 IU/L (ref 0–32)
AST: 24 IU/L (ref 0–40)
Albumin/Globulin Ratio: 1.7 (ref 1.2–2.2)
Albumin: 4.7 g/dL (ref 3.8–4.8)
Alkaline Phosphatase: 80 IU/L (ref 44–121)
BUN/Creatinine Ratio: 27 (ref 12–28)
BUN: 29 mg/dL — ABNORMAL HIGH (ref 8–27)
Bilirubin Total: 0.7 mg/dL (ref 0.0–1.2)
CO2: 26 mmol/L (ref 20–29)
Calcium: 9.9 mg/dL (ref 8.7–10.3)
Chloride: 99 mmol/L (ref 96–106)
Creatinine, Ser: 1.09 mg/dL — ABNORMAL HIGH (ref 0.57–1.00)
Globulin, Total: 2.8 g/dL (ref 1.5–4.5)
Glucose: 84 mg/dL (ref 65–99)
Potassium: 5.4 mmol/L — ABNORMAL HIGH (ref 3.5–5.2)
Sodium: 141 mmol/L (ref 134–144)
Total Protein: 7.5 g/dL (ref 6.0–8.5)
eGFR: 55 mL/min/{1.73_m2} — ABNORMAL LOW (ref >59)

## 2020-03-11 LAB — HEMOGLOBIN A1C
Est. average glucose Bld gHb Est-mCnc: 114 mg/dL
Hgb A1c MFr Bld: 5.6 % (ref 4.8–5.6)

## 2020-03-11 NOTE — Progress Notes (Signed)
Please let Ms. Jesson know that her lab work shows that her kidney function is stable.  Her numbers are slightly improved from 6 months ago.  A1c is 5.6 which is improved from 6.2.  Keep up the good work.  Your cholesterol is still high, but continued recommendations to make lifestyle changes. Your LDL is above normal. The LDL is the bad cholesterol. Over time and in combination with inflammation and other factors, this contributes to plaque which in turn may lead to stroke and/or heart attack down the road. Sometimes high LDL is primarily genetic, and people might be eating all the right foods but still have high numbers. Other times, there is room for improvement in one's diet and eating healthier can bring this number down and potentially reduce one's risk of heart attack and/or stroke.   To reduce your LDL, Remember - more fruits and vegetables, more fish, and limit red meat and dairy products. More soy, nuts, beans, barley, lentils, oats and plant sterol ester enriched margarine instead of butter. I also encourage eliminating sugar and processed food. Remember, shop on the outside of the grocery store and visit your [employer]. If you would like to talk with me about dietary changes plus or minus medications for your cholesterol, please let me know. We should recheck your cholesterol in 3-6 months.  It was a pleasure meeting her yesterday.  I look forward to our next visit.

## 2020-03-13 DIAGNOSIS — J449 Chronic obstructive pulmonary disease, unspecified: Secondary | ICD-10-CM | POA: Diagnosis not present

## 2020-03-16 DIAGNOSIS — N179 Acute kidney failure, unspecified: Secondary | ICD-10-CM | POA: Diagnosis not present

## 2020-03-16 DIAGNOSIS — I5022 Chronic systolic (congestive) heart failure: Secondary | ICD-10-CM | POA: Diagnosis not present

## 2020-03-16 DIAGNOSIS — N182 Chronic kidney disease, stage 2 (mild): Secondary | ICD-10-CM | POA: Diagnosis not present

## 2020-03-19 ENCOUNTER — Other Ambulatory Visit: Payer: Self-pay | Admitting: Nurse Practitioner

## 2020-04-01 DIAGNOSIS — J449 Chronic obstructive pulmonary disease, unspecified: Secondary | ICD-10-CM | POA: Diagnosis not present

## 2020-04-01 DIAGNOSIS — I5022 Chronic systolic (congestive) heart failure: Secondary | ICD-10-CM | POA: Diagnosis not present

## 2020-04-13 DIAGNOSIS — J449 Chronic obstructive pulmonary disease, unspecified: Secondary | ICD-10-CM | POA: Diagnosis not present

## 2020-04-29 DIAGNOSIS — Z1211 Encounter for screening for malignant neoplasm of colon: Secondary | ICD-10-CM | POA: Diagnosis not present

## 2020-04-29 DIAGNOSIS — Z1212 Encounter for screening for malignant neoplasm of rectum: Secondary | ICD-10-CM | POA: Diagnosis not present

## 2020-04-29 LAB — COLOGUARD: Cologuard: NEGATIVE

## 2020-05-01 DIAGNOSIS — J449 Chronic obstructive pulmonary disease, unspecified: Secondary | ICD-10-CM | POA: Diagnosis not present

## 2020-05-01 DIAGNOSIS — I5022 Chronic systolic (congestive) heart failure: Secondary | ICD-10-CM | POA: Diagnosis not present

## 2020-05-03 ENCOUNTER — Ambulatory Visit (INDEPENDENT_AMBULATORY_CARE_PROVIDER_SITE_OTHER): Payer: Medicare Other | Admitting: Nurse Practitioner

## 2020-05-03 ENCOUNTER — Other Ambulatory Visit: Payer: Self-pay

## 2020-05-03 ENCOUNTER — Encounter: Payer: Self-pay | Admitting: Nurse Practitioner

## 2020-05-03 VITALS — BP 101/58 | HR 77 | Temp 97.8°F | Wt 183.2 lb

## 2020-05-03 DIAGNOSIS — R1013 Epigastric pain: Secondary | ICD-10-CM | POA: Diagnosis not present

## 2020-05-03 MED ORDER — NYSTATIN 100000 UNIT/GM EX CREA: 1.0000 "application " | TOPICAL_CREAM | Freq: Two times a day (BID) | CUTANEOUS | 0 refills | Status: DC

## 2020-05-03 MED ORDER — OMEPRAZOLE 20 MG PO CPDR: 20.0000 mg | DELAYED_RELEASE_CAPSULE | Freq: Every day | ORAL | 3 refills | Status: DC

## 2020-05-03 NOTE — Progress Notes (Signed)
BP (!) 101/58   Pulse 77   Temp 97.8 F (36.6 C)   Wt 183 lb 4 oz (83.1 kg) Comment: With shoes  SpO2 90%   BMI 33.84 kg/m    Subjective:    Patient ID: Maria Neal, female    DOB: 06-06-1950, 70 y.o.   MRN: [id number]  HPI: Maria Neal is a 70 y.o. female  Chief Complaint  Patient presents with  . Abdominal Pain    Above belly button, has been seen by GI twice with no findings  . Medication Refill    Nystatin cream   ABDOMINAL PAIN Patient states she has been having the abdominal pain for about 2 months.  But it has recently worsened.  Patient states last night it was worse.  Patient states it feels like burning pain and someone is stabbing her.  Patient states it is worse after she eats. Last night the pain was worse with lying down.  Has not tried any ant acids. Patient does have some SOB.   Relevant past medical, surgical, family and social history reviewed and updated as indicated. Interim medical history since our last visit reviewed. Allergies and medications reviewed and updated.  Review of Systems  Respiratory: Positive for shortness of breath.   Gastrointestinal: Positive for abdominal pain.    Per HPI unless specifically indicated above     Objective:    BP (!) 101/58   Pulse 77   Temp 97.8 F (36.6 C)   Wt 183 lb 4 oz (83.1 kg) Comment: With shoes  SpO2 90%   BMI 33.84 kg/m   Wt Readings from Last 3 Encounters:  05/03/20 183 lb 4 oz (83.1 kg)  03/10/20 179 lb 9.6 oz (81.5 kg)  09/19/19 175 lb 14.8 oz (79.8 kg)    Physical Exam Vitals and nursing note reviewed.  Constitutional:      General: She is not in acute distress.    Appearance: Normal appearance. She is normal weight. She is not ill-appearing, toxic-appearing or diaphoretic.  HENT:     Head: Normocephalic.     Right Ear: External ear normal.     Left Ear: External ear normal.     Nose: Nose normal.     Mouth/Throat:     Mouth: Mucous membranes are moist.     Pharynx: Oropharynx  is clear.  Eyes:     General:        Right eye: No discharge.        Left eye: No discharge.     Extraocular Movements: Extraocular movements intact.     Conjunctiva/sclera: Conjunctivae normal.     Pupils: Pupils are equal, round, and reactive to light.  Cardiovascular:     Rate and Rhythm: Normal rate and regular rhythm.     Heart sounds: No murmur heard.   Pulmonary:     Effort: Pulmonary effort is normal. No respiratory distress.     Breath sounds: Normal breath sounds. No wheezing or rales.  Abdominal:     General: Bowel sounds are normal. There is no distension. There are no signs of injury.     Tenderness: There is generalized abdominal tenderness and tenderness in the epigastric area.  Musculoskeletal:     Cervical back: Normal range of motion and neck supple.  Skin:    General: Skin is warm and dry.     Capillary Refill: Capillary refill takes less than 2 seconds.  Neurological:     General: No focal deficit  present.     Mental Status: She is alert and oriented to person, place, and time. Mental status is at baseline.  Psychiatric:        Mood and Affect: Mood normal.        Behavior: Behavior normal.        Thought Content: Thought content normal.        Judgment: Judgment normal.     Results for orders placed or performed in visit on 03/10/20  Comp Met (CMET)  Result Value Ref Range   Glucose 84 65 - 99 mg/dL   BUN 29 (H) 8 - 27 mg/dL   Creatinine, Ser 1.09 (H) 0.57 - 1.00 mg/dL   eGFR 55 (L) >59 mL/min/1.73   BUN/Creatinine Ratio 27 12 - 28   Sodium 141 134 - 144 mmol/L   Potassium 5.4 (H) 3.5 - 5.2 mmol/L   Chloride 99 96 - 106 mmol/L   CO2 26 20 - 29 mmol/L   Calcium 9.9 8.7 - 10.3 mg/dL   Total Protein 7.5 6.0 - 8.5 g/dL   Albumin 4.7 3.8 - 4.8 g/dL   Globulin, Total 2.8 1.5 - 4.5 g/dL   Albumin/Globulin Ratio 1.7 1.2 - 2.2   Bilirubin Total 0.7 0.0 - 1.2 mg/dL   Alkaline Phosphatase 80 44 - 121 IU/L   AST 24 0 - 40 IU/L   ALT 14 0 - 32 IU/L  HgB  A1c  Result Value Ref Range   Hgb A1c MFr Bld 5.6 4.8 - 5.6 %   Est. average glucose Bld gHb Est-mCnc 114 mg/dL  Lipid Profile  Result Value Ref Range   Cholesterol, Total 202 (H) 100 - 199 mg/dL   Triglycerides 140 0 - 149 mg/dL   HDL 49 >39 mg/dL   VLDL Cholesterol Cal 25 5 - 40 mg/dL   LDL Chol Calc (NIH) 128 (H) 0 - 99 mg/dL   Chol/HDL Ratio 4.1 0.0 - 4.4 ratio      Assessment & Plan:   Problem List Items Addressed This Visit      Other   Epigastric pain - Primary   Relevant Orders   US Abdomen Complete   EKG 12-Lead (Completed)   Comp Met (CMET)       Follow up plan: No follow-ups on file.

## 2020-05-03 NOTE — Progress Notes (Signed)
Results reviewed with patient in office.

## 2020-05-03 NOTE — Progress Notes (Deleted)
There were no vitals taken for this visit.   Subjective:    Patient ID: Maria Neal, female    DOB: 03/01/1950, 70 y.o.   MRN: [id number]  HPI: Maria Neal is a 70 y.o. female  No chief complaint on file.  HYPERTENSION / HYPERLIPIDEMIA Satisfied with current treatment? yes Duration of hypertension: years BP monitoring frequency: not checking BP range:  BP medication side effects: no Past BP meds: carvedilol, losartan (cozaar) and spironalactone Duration of hyperlipidemia: years Cholesterol medication side effects: no Cholesterol supplements: none Past cholesterol medications: atorvastain (lipitor) Medication compliance: excellent compliance Aspirin: yes Recent stressors: no Recurrent headaches: no Visual changes: no Palpitations: no Dyspnea: yes Chest pain: no Lower extremity edema: no Dizzy/lightheaded: no  COPD O2 is 90% at home. COPD status: stable Satisfied with current treatment?: yes Oxygen use: yes Dyspnea frequency:  When she walks Cough frequency: no Rescue inhaler frequency:  3x weeks Limitation of activity: no Productive cough: no Last Spirometry:  Pneumovax: Up to Date Influenza: Up to Date  CORONARY ARTERY DISEASE/ HEART FAILURE/ LBBB Patient follows up with Cardiology for management of CAD, CHF, and her LBBB.  Last ECHO was in May 2021 and EF was 25%.  Sleeps on 3 pillows at baseline at night.  COPD Patient is on 3L at baseline when she needs it but mostly at night.  She was a smoker but quit 10 years ago.   Relevant past medical, surgical, family and social history reviewed and updated as indicated. Interim medical history since our last visit reviewed. Allergies and medications reviewed and updated.  Review of Systems  Eyes: Negative for visual disturbance.  Respiratory: Positive for shortness of breath. Negative for cough and chest tightness.   Cardiovascular: Negative for chest pain, palpitations and leg swelling.  Neurological:  Negative for dizziness and headaches.    Per HPI unless specifically indicated above     Objective:    There were no vitals taken for this visit.  Wt Readings from Last 3 Encounters:  03/10/20 179 lb 9.6 oz (81.5 kg)  09/19/19 175 lb 14.8 oz (79.8 kg)  09/04/19 176 lb (79.8 kg)    Physical Exam Vitals and nursing note reviewed.  Constitutional:      General: She is not in acute distress.    Appearance: Normal appearance. She is normal weight. She is not ill-appearing, toxic-appearing or diaphoretic.  HENT:     Head: Normocephalic.     Right Ear: External ear normal.     Left Ear: External ear normal.     Nose: Nose normal.     Mouth/Throat:     Mouth: Mucous membranes are moist.     Pharynx: Oropharynx is clear.  Eyes:     General:        Right eye: No discharge.        Left eye: No discharge.     Extraocular Movements: Extraocular movements intact.     Conjunctiva/sclera: Conjunctivae normal.     Pupils: Pupils are equal, round, and reactive to light.  Cardiovascular:     Rate and Rhythm: Normal rate and regular rhythm.     Heart sounds: Murmur heard.    Pulmonary:     Effort: Pulmonary effort is normal. No respiratory distress.     Breath sounds: Decreased air movement present. Wheezing present. No rales.  Musculoskeletal:     Cervical back: Normal range of motion and neck supple.  Skin:    General: Skin is warm and  dry.     Capillary Refill: Capillary refill takes less than 2 seconds.  Neurological:     General: No focal deficit present.     Mental Status: She is alert and oriented to person, place, and time. Mental status is at baseline.  Psychiatric:        Mood and Affect: Mood normal.        Behavior: Behavior normal.        Thought Content: Thought content normal.        Judgment: Judgment normal.     Results for orders placed or performed in visit on 03/10/20  Comp Met (CMET)  Result Value Ref Range   Glucose 84 65 - 99 mg/dL   BUN 29 (H) 8 - 27  mg/dL   Creatinine, Ser 1.09 (H) 0.57 - 1.00 mg/dL   eGFR 55 (L) >59 mL/min/1.73   BUN/Creatinine Ratio 27 12 - 28   Sodium 141 134 - 144 mmol/L   Potassium 5.4 (H) 3.5 - 5.2 mmol/L   Chloride 99 96 - 106 mmol/L   CO2 26 20 - 29 mmol/L   Calcium 9.9 8.7 - 10.3 mg/dL   Total Protein 7.5 6.0 - 8.5 g/dL   Albumin 4.7 3.8 - 4.8 g/dL   Globulin, Total 2.8 1.5 - 4.5 g/dL   Albumin/Globulin Ratio 1.7 1.2 - 2.2   Bilirubin Total 0.7 0.0 - 1.2 mg/dL   Alkaline Phosphatase 80 44 - 121 IU/L   AST 24 0 - 40 IU/L   ALT 14 0 - 32 IU/L  HgB A1c  Result Value Ref Range   Hgb A1c MFr Bld 5.6 4.8 - 5.6 %   Est. average glucose Bld gHb Est-mCnc 114 mg/dL  Lipid Profile  Result Value Ref Range   Cholesterol, Total 202 (H) 100 - 199 mg/dL   Triglycerides 140 0 - 149 mg/dL   HDL 49 >39 mg/dL   VLDL Cholesterol Cal 25 5 - 40 mg/dL   LDL Chol Calc (NIH) 128 (H) 0 - 99 mg/dL   Chol/HDL Ratio 4.1 0.0 - 4.4 ratio      Assessment & Plan:   Problem List Items Addressed This Visit   None      Follow up plan: No follow-ups on file.

## 2020-05-04 ENCOUNTER — Ambulatory Visit: Payer: Medicare Other

## 2020-05-04 LAB — COMPREHENSIVE METABOLIC PANEL
ALT: 21 IU/L (ref 0–32)
AST: 20 IU/L (ref 0–40)
Albumin/Globulin Ratio: 1.5 (ref 1.2–2.2)
Albumin: 4.3 g/dL (ref 3.8–4.8)
Alkaline Phosphatase: 80 IU/L (ref 44–121)
BUN/Creatinine Ratio: 25 (ref 12–28)
BUN: 35 mg/dL — ABNORMAL HIGH (ref 8–27)
Bilirubin Total: 0.7 mg/dL (ref 0.0–1.2)
CO2: 25 mmol/L (ref 20–29)
Calcium: 9.1 mg/dL (ref 8.7–10.3)
Chloride: 103 mmol/L (ref 96–106)
Creatinine, Ser: 1.42 mg/dL — ABNORMAL HIGH (ref 0.57–1.00)
Globulin, Total: 2.9 g/dL (ref 1.5–4.5)
Glucose: 116 mg/dL — ABNORMAL HIGH (ref 65–99)
Potassium: 4.6 mmol/L (ref 3.5–5.2)
Sodium: 144 mmol/L (ref 134–144)
Total Protein: 7.2 g/dL (ref 6.0–8.5)
eGFR: 40 mL/min/{1.73_m2} — ABNORMAL LOW (ref >59)

## 2020-05-05 ENCOUNTER — Telehealth: Payer: Self-pay

## 2020-05-05 NOTE — Progress Notes (Signed)
Please let patient know that her kidney function came back a little worse than prior.  I would like her to obtain the [place] as soon as possible.  Also, I would like her to come back and repeat the lab work as a lab visit next week to make sure he kidney function returns to normal. Please make her a lab appointment.

## 2020-05-05 NOTE — Addendum Note (Signed)
Addended by: Jon Billings on: 05/05/2020 09:20 AM   Modules accepted: Orders

## 2020-05-05 NOTE — Telephone Encounter (Signed)
Called patient per Maria Neal to see if patient was aware that our team is trying to get in touch with her to get her scheduled for a ultrasound.  Patient did not answer and does not have a voicemail.

## 2020-05-07 NOTE — Progress Notes (Signed)
Unable to lvm to schedule lab apt

## 2020-05-10 NOTE — Progress Notes (Signed)
Lvm to make this apt. 

## 2020-05-11 ENCOUNTER — Telehealth: Payer: Self-pay

## 2020-05-11 DIAGNOSIS — R0600 Dyspnea, unspecified: Secondary | ICD-10-CM | POA: Diagnosis not present

## 2020-05-11 DIAGNOSIS — Z9981 Dependence on supplemental oxygen: Secondary | ICD-10-CM | POA: Diagnosis not present

## 2020-05-11 DIAGNOSIS — J410 Simple chronic bronchitis: Secondary | ICD-10-CM | POA: Diagnosis not present

## 2020-05-11 DIAGNOSIS — J449 Chronic obstructive pulmonary disease, unspecified: Secondary | ICD-10-CM | POA: Diagnosis not present

## 2020-05-11 NOTE — Telephone Encounter (Signed)
4th attempt to call to make this lab apt.

## 2020-05-11 NOTE — Telephone Encounter (Signed)
-----   Message from Addison Naegeli, RN sent at 05/07/2020 10:46 AM EDT ----- Pt given results and recommendations per Jon Billings "Please let patient know that her kidney function came back a little worse than prior.  I would like her to obtain the [place] as soon as possible.  Also, I would like her to come back and repeat the lab work as a lab visit next week to make sure he kidney function returns to normal. Please make her a lab appointment."; the pt verbalized understanding and has scheduled [place] appt; the pt also says she will have to call back to schedule her lab appt because she has to discuss transportation with her daughter; will route to office for notification.

## 2020-05-13 ENCOUNTER — Other Ambulatory Visit: Payer: Self-pay

## 2020-05-13 ENCOUNTER — Other Ambulatory Visit: Payer: Medicare Other

## 2020-05-13 DIAGNOSIS — R1013 Epigastric pain: Secondary | ICD-10-CM | POA: Diagnosis not present

## 2020-05-14 LAB — COMPREHENSIVE METABOLIC PANEL
ALT: 21 IU/L (ref 0–32)
AST: 27 IU/L (ref 0–40)
Albumin/Globulin Ratio: 1.7 (ref 1.2–2.2)
Albumin: 4.3 g/dL (ref 3.8–4.8)
Alkaline Phosphatase: 78 IU/L (ref 44–121)
BUN/Creatinine Ratio: 23 (ref 12–28)
BUN: 27 mg/dL (ref 8–27)
Bilirubin Total: 0.9 mg/dL (ref 0.0–1.2)
CO2: 23 mmol/L (ref 20–29)
Calcium: 9.4 mg/dL (ref 8.7–10.3)
Chloride: 99 mmol/L (ref 96–106)
Creatinine, Ser: 1.16 mg/dL — ABNORMAL HIGH (ref 0.57–1.00)
Globulin, Total: 2.6 g/dL (ref 1.5–4.5)
Glucose: 107 mg/dL — ABNORMAL HIGH (ref 65–99)
Potassium: 4.3 mmol/L (ref 3.5–5.2)
Sodium: 143 mmol/L (ref 134–144)
Total Protein: 6.9 g/dL (ref 6.0–8.5)
eGFR: 51 mL/min/{1.73_m2} — ABNORMAL LOW (ref >59)

## 2020-05-14 NOTE — Progress Notes (Signed)
Please let Maria Neal know that her kidney function has improved since our last visit which is great.  Please make sure she has the ultrasound I ordered scheduled.

## 2020-05-17 ENCOUNTER — Telehealth: Payer: Self-pay | Admitting: Nurse Practitioner

## 2020-05-17 NOTE — Telephone Encounter (Signed)
Requested medication (s) are due for refill today: last refill 05/03/20  Requested medication (s) are on the active medication list: yes  Last refill:  05/03/20 #30 0 refills  Future visit scheduled: yes in 3 months   Notes to clinic:  medication not assigned to a protocol     Requested Prescriptions  Pending Prescriptions Disp Refills   nystatin cream (MYCOSTATIN) [Pharmacy Med Name: NYSTATIN 100000 UNIT/GM TOP CREAM G] 30 g 0    Sig: APPLY TO AFFECTED AREA(s) TWICE DAILY      Off-Protocol Failed - 05/17/2020  4:34 PM      Failed - Medication not assigned to a protocol, review manually.      Passed - Valid encounter within last 12 months    Recent Outpatient Visits           2 weeks ago Epigastric pain   [facility] Jon Billings, NP   2 months ago Benign essential HTN   [facility] Jon Billings, NP   8 months ago Flu vaccine need   [facility] Kathrine Haddock, NP   9 months ago Migraine without aura and without status migrainosus, not intractable   [facility] Merrie Roof [place], [place]   1 year ago Chronic systolic congestive heart failure, NYHA class 3 [facility])   [facility] Volney American, [place]       Future Appointments             In 3 months Jon Billings, NP [facility], [place]

## 2020-05-18 DIAGNOSIS — I5022 Chronic systolic (congestive) heart failure: Secondary | ICD-10-CM | POA: Diagnosis not present

## 2020-05-18 NOTE — Telephone Encounter (Signed)
Pt last apt on 05/03/2020

## 2020-05-19 NOTE — Telephone Encounter (Signed)
Patient states that she picked it up and used it.  She is out

## 2020-05-19 NOTE — Telephone Encounter (Signed)
Please advise 

## 2020-05-19 NOTE — Telephone Encounter (Signed)
I sent the cream two weeks ago.  Is she already out?

## 2020-05-19 NOTE — Telephone Encounter (Signed)
Patient has called in about wanting to get a medication refill, due to almost being out. She states the she is really irritated and having a lot of itching.

## 2020-05-19 NOTE — Telephone Encounter (Signed)
Would pt need apt for this?

## 2020-05-20 MED ORDER — NYSTATIN 100000 UNIT/GM EX CREA: 1.0000 "application " | TOPICAL_CREAM | Freq: Two times a day (BID) | CUTANEOUS | 0 refills | Status: DC

## 2020-05-20 NOTE — Addendum Note (Signed)
Addended by: Jon Billings on: 05/20/2020 06:27 PM   Modules accepted: Orders

## 2020-05-20 NOTE — Telephone Encounter (Signed)
The cream is helping with the rash, it does worsen without the cream and yes this is the same rash she typically gets and uses the nystatin cream for. Patient states that she would really like a refill so the rash will go away

## 2020-05-20 NOTE — Telephone Encounter (Signed)
Medication sent to the pharmacy.  If symptoms do not improve, she should come in for a visit/

## 2020-05-20 NOTE — Telephone Encounter (Signed)
Did the rash improve with the cream?  Is it worsening without the cream? Is it the same rash she typically gets and uses the nystatin for?

## 2020-05-21 NOTE — Telephone Encounter (Signed)
Tried to call patient, no answer, unable to leave a message. 

## 2020-05-24 NOTE — Telephone Encounter (Signed)
Patient notified

## 2020-05-27 ENCOUNTER — Other Ambulatory Visit: Payer: Self-pay

## 2020-05-27 ENCOUNTER — Ambulatory Visit
Admission: RE | Admit: 2020-05-27 | Discharge: 2020-05-27 | Disposition: A | Discharge status: Home / Self Care | Payer: Medicare Other | Source: Ambulatory Visit | Attending: Nurse Practitioner | Admitting: Nurse Practitioner

## 2020-05-27 DIAGNOSIS — R109 Unspecified abdominal pain: Secondary | ICD-10-CM | POA: Diagnosis not present

## 2020-05-27 DIAGNOSIS — R1013 Epigastric pain: Secondary | ICD-10-CM | POA: Diagnosis not present

## 2020-05-27 NOTE — Progress Notes (Signed)
Please let Maria Neal know that there was no acute finding on her imaging.  She does have a low of gas in her bowels.  Please let me know if she has any questions.

## 2020-05-28 ENCOUNTER — Other Ambulatory Visit: Payer: Self-pay | Admitting: Nurse Practitioner

## 2020-06-01 DIAGNOSIS — I5022 Chronic systolic (congestive) heart failure: Secondary | ICD-10-CM | POA: Diagnosis not present

## 2020-06-01 DIAGNOSIS — J449 Chronic obstructive pulmonary disease, unspecified: Secondary | ICD-10-CM | POA: Diagnosis not present

## 2020-06-02 ENCOUNTER — Other Ambulatory Visit: Payer: Self-pay | Admitting: Family Medicine

## 2020-06-02 ENCOUNTER — Other Ambulatory Visit: Payer: Self-pay | Admitting: Nurse Practitioner

## 2020-06-02 NOTE — Telephone Encounter (Signed)
Pt had  apt on 05/03/2020 next on 09/06/2020

## 2020-06-02 NOTE — Telephone Encounter (Signed)
  Notes to clinic:  medication requested is not on current  Review for use and refill    Requested Prescriptions  Pending Prescriptions Disp Refills   gabapentin (NEURONTIN) 300 MG capsule [Pharmacy Med Name: GABAPENTIN 300 MG CAP] 270 capsule     Sig: TAKE 1 CAPSULE BY MOUTH 3 TIMES DAILY      Neurology: Anticonvulsants - gabapentin Passed - 06/02/2020 11:49 AM      Passed - Valid encounter within last 12 months    Recent Outpatient Visits           1 month ago Epigastric pain   [facility] Jon Billings, NP   2 months ago Benign essential HTN   [facility] Jon Billings, NP   9 months ago Flu vaccine need   [facility] Kathrine Haddock, NP   10 months ago Migraine without aura and without status migrainosus, not intractable   [facility], [place], [place]   1 year ago Chronic systolic congestive heart failure, NYHA class 3 [facility])   [place], Lilia Arg[place], [place]       Future Appointments             In 3 months Jon Billings, NP [facility], [place]

## 2020-06-02 NOTE — Telephone Encounter (Signed)
   Notes to clinic:  Patient states that she is out of cream and still having itching  Review to fill cream early    Requested Prescriptions  Pending Prescriptions Disp Refills   nystatin cream (MYCOSTATIN) 30 g 0    Sig: Apply 1 application topically 2 (two) times daily.      Off-Protocol Failed - 06/02/2020  9:41 AM      Failed - Medication not assigned to a protocol, review manually.      Passed - Valid encounter within last 12 months    Recent Outpatient Visits           1 month ago Epigastric pain   [facility] Jon Billings, NP   2 months ago Benign essential HTN   [facility] [place] Jon Billings, NP   9 months ago Flu vaccine need   [facility] Kathrine Haddock, NP   10 months ago Migraine without aura and without status migrainosus, not intractable   [facility] Merrie Roof [place], [place]   1 year ago Chronic systolic congestive heart failure, NYHA class 3 ([place])   [facility] Volney American, [place]       Future Appointments             In 3 months Jon Billings, NP [place], [facility]              Refused Prescriptions Disp Refills   nystatin cream (MYCOSTATIN) [Pharmacy Med Name: NYSTATIN 100000 UNIT/GM TOP CREAM G] 30 g 0    Sig: APPLY TO AFFECTED AREA(s) TWICE DAILY ASDIRECTED      Off-Protocol Failed - 06/02/2020  9:41 AM      Failed - Medication not assigned to a protocol, review manually.      Passed - Valid encounter within last 12 months    Recent Outpatient Visits           1 month ago Epigastric pain   [facility] Jon Billings, NP   2 months ago Benign essential HTN   [facility] Jon Billings, NP   9 months ago Flu vaccine need   [facility] Kathrine Haddock, NP   10 months ago Migraine without aura and without status migrainosus, not intractable   [facility] Merrie Roof [place], [place]   1 year  ago Chronic systolic congestive heart failure, NYHA class 3 [facility])   [facility] Volney American, [place]       Future Appointments             In 3 months Jon Billings, NP [facility], [place]

## 2020-06-02 NOTE — Telephone Encounter (Signed)
Pt had apt on 05/03/2020 next 09/06/2020

## 2020-06-02 NOTE — Telephone Encounter (Signed)
I refilled this last week.  If patient is still having symptoms she should be seen for an appointment.

## 2020-06-02 NOTE — Telephone Encounter (Signed)
Pt called about refill for cream/ I advised her it was denied due to requesting too early/ pt stated she is out and still experiencing itching / pt would like a refill for the nystatin cream (MYCOSTATIN) [Pharmacy Med Name: NYSTATIN 100000 UNIT/GM TOP CREAM G]  Sent to [place], [place].  [address] [place] [address]  Phone:  [phone] Fax:  [phone]

## 2020-06-02 NOTE — Telephone Encounter (Signed)
Requested medication (s) are due for refill today: yes  Requested medication (s) are on the active medication list: yes  Last refill:  05/20/20  Future visit scheduled: yes  Notes to clinic:  no assigned protocol    Requested Prescriptions  Pending Prescriptions Disp Refills   nystatin cream (MYCOSTATIN) [Pharmacy Med Name: NYSTATIN 100000 UNIT/GM TOP CREAM G] 30 g 0    Sig: APPLY TO AFFECTED AREA(s) TWICE DAILY ASDIRECTED      Off-Protocol Failed - 06/02/2020 11:47 AM      Failed - Medication not assigned to a protocol, review manually.      Passed - Valid encounter within last 12 months    Recent Outpatient Visits           1 month ago Epigastric pain   [facility] Jon Billings, NP   2 months ago Benign essential HTN   [facility] Jon Billings, NP   9 months ago Flu vaccine need   [facility] Kathrine Haddock, NP   10 months ago Migraine without aura and without status migrainosus, not intractable   [facility] Merrie Roof [place], [place]   1 year ago Chronic systolic congestive heart failure, NYHA class 3 [facility])   [facility] Volney American, [place]       Future Appointments             In 3 months Jon Billings, NP [facility], [place]

## 2020-06-02 NOTE — Addendum Note (Signed)
Addended by: Jefferson Fuel on: 06/02/2020 09:41 AM   Modules accepted: Orders

## 2020-06-22 ENCOUNTER — Ambulatory Visit: Payer: Self-pay | Admitting: *Deleted

## 2020-06-22 NOTE — Telephone Encounter (Signed)
Scheduled 6/24

## 2020-06-22 NOTE — Telephone Encounter (Signed)
Patient's daughter called and wants to know why her mother was put on anti-seizure medication? She does'nt know the name of the medication. Please call back   Called patient's daughter to review questions regarding medications. Patient's daughter reports she had noticed since starting a new medication for her stomach pain patient has noted with continued stomach bloating during the day. Hand tremors and difficulty remembering day and night. Memory loss noted with repeating her self a lot. Patient's daughter is not with patient at this time. Reviewed if patient worsens or any other symptoms arise call back. No report of weakness of either side of body. Newest medication may be gabapentin 300 mg . FC called to schedule appt for patient. Care advise given to daughter. Patient's daughter verbalized understanding of care advise and to call back or go to ED if symptoms for patient worsen.

## 2020-06-22 NOTE — Telephone Encounter (Signed)
Patient was given Gabapentin, which can be used to treat nerve pain, in July of 2021. Patient was having migraines at the time, it was given to her to take as needed for nerve pain and migraine pain.  If she is no longer having that pain she doesn't need to take the medication. If she is taking it three times daily she should wean down on the medication by taking two daily for a week then 1 daily for a week then 1 every other day for a week.  At that point she can stop the medication.

## 2020-06-22 NOTE — Telephone Encounter (Signed)
Reason for Disposition  [1] Worsening confusion AND [2] gradual onset (days to weeks)  Answer Assessment - Initial Assessment Questions 1. MAIN CONCERN OR SYMPTOM:  "What is your main concern right now?" "What questions do you have?" "What's the main symptom you're worried about?" (e.g., confusion, memory loss)    When was patient started on anti- seizure medication. Patient having increased memory loss and hands tremors 2. ONSET:  "When did the symptom start (or worsen)?" (minutes, hours, days, weeks)     Na  3. BETTER-SAME-WORSE: "Are you (the patient) getting better, staying the same, or getting worse compared to the day you (they) were diagnosed or most recent hospital discharge ?"     Worse  4. DIAGNOSIS: "Was the dementia diagnosed by a doctor?" If Yes, ask: "When?" (e.g., days, months, years ago)     Na  5. MEDICATION: "Has there been any change in medicines recently?" (e.g., narcotics, antihistamines, benzodiazepines, etc.)     Yes , patient  daughter can not remember name of recent medication started  6. OTHER SYMPTOMS: "Are there any other symptoms?" (e.g., fever, cough, pain, falling)     Stomach pain and bloating during the day, hand tremors, memory loss 7. SUPPORT: Document living circumstances and support (e.g., family, nursing home)     Lives with son  Protocols used: Dementia Symptoms and Questions-A-AH

## 2020-06-23 NOTE — Telephone Encounter (Signed)
Spoke with patient's daughter [name] to notify her of Karen's recommendations. Patient also advised [name] that medication shouldn't currently be causing patient's to have hand tremors, but she is states it may cause a sedative effect on the patient from taking the medication too much. Patient daughter verbalized understanding and wanted to make Santiago Glad aware of patient issues with memory loss and tremors before patient scheduled appointment this coming Friday.

## 2020-06-25 ENCOUNTER — Other Ambulatory Visit: Payer: Self-pay

## 2020-06-25 ENCOUNTER — Ambulatory Visit (INDEPENDENT_AMBULATORY_CARE_PROVIDER_SITE_OTHER): Payer: Medicare Other | Admitting: Nurse Practitioner

## 2020-06-25 ENCOUNTER — Encounter: Payer: Self-pay | Admitting: Nurse Practitioner

## 2020-06-25 VITALS — BP 121/66 | HR 75 | Wt 187.1 lb

## 2020-06-25 DIAGNOSIS — Z79899 Other long term (current) drug therapy: Secondary | ICD-10-CM | POA: Diagnosis not present

## 2020-06-25 DIAGNOSIS — J449 Chronic obstructive pulmonary disease, unspecified: Secondary | ICD-10-CM

## 2020-06-25 NOTE — Progress Notes (Signed)
BP 121/66   Pulse 75   Wt 187 lb 2 oz (84.9 kg)   SpO2 (!) 88%   BMI 34.56 kg/m    Subjective:    Patient ID: Maria Neal, female    DOB: May 08, 1950, 70 y.o.   MRN: [id number]  HPI: Maria Neal is a 70 y.o. female  Chief Complaint  Patient presents with  . Medication Problem   Medication Concern Patient presents to clinic with concerns about why she is taking certain medications.  Patient is not sure why she is taking Gabapentin or Omeprazole.   Denies HA, CP, dizziness, palpitations, visual changes, and lower extremity swelling.  Does have SOB.    Relevant past medical, surgical, family and social history reviewed and updated as indicated. Interim medical history since our last visit reviewed. Allergies and medications reviewed and updated.  Review of Systems  Eyes:  Negative for visual disturbance.  Respiratory:  Positive for shortness of breath. Negative for cough and chest tightness.   Cardiovascular:  Negative for chest pain, palpitations and leg swelling.  Neurological:  Negative for dizziness and headaches.   Per HPI unless specifically indicated above     Objective:    BP 121/66   Pulse 75   Wt 187 lb 2 oz (84.9 kg)   SpO2 (!) 88%   BMI 34.56 kg/m   Wt Readings from Last 3 Encounters:  06/25/20 187 lb 2 oz (84.9 kg)  05/03/20 183 lb 4 oz (83.1 kg)  03/10/20 179 lb 9.6 oz (81.5 kg)    Physical Exam Vitals and nursing note reviewed.  Constitutional:      General: She is not in acute distress.    Appearance: Normal appearance. She is normal weight. She is not ill-appearing, toxic-appearing or diaphoretic.  HENT:     Head: Normocephalic.     Right Ear: External ear normal.     Left Ear: External ear normal.     Nose: Nose normal.     Mouth/Throat:     Mouth: Mucous membranes are moist.     Pharynx: Oropharynx is clear.  Eyes:     General:        Right eye: No discharge.        Left eye: No discharge.     Extraocular Movements: Extraocular  movements intact.     Conjunctiva/sclera: Conjunctivae normal.     Pupils: Pupils are equal, round, and reactive to light.  Cardiovascular:     Rate and Rhythm: Normal rate and regular rhythm.     Heart sounds: Murmur heard.  Pulmonary:     Effort: Pulmonary effort is normal. No respiratory distress.     Breath sounds: Decreased air movement present. Decreased breath sounds present. No wheezing or rales.  Musculoskeletal:     Cervical back: Normal range of motion and neck supple.  Skin:    General: Skin is warm and dry.     Capillary Refill: Capillary refill takes less than 2 seconds.  Neurological:     General: No focal deficit present.     Mental Status: She is alert and oriented to person, place, and time. Mental status is at baseline.  Psychiatric:        Mood and Affect: Mood normal.        Behavior: Behavior normal.        Thought Content: Thought content normal.        Judgment: Judgment normal.    Results for orders placed or performed  in visit on 05/13/20  Comp Met (CMET)  Result Value Ref Range   Glucose 107 (H) 65 - 99 mg/dL   BUN 27 8 - 27 mg/dL   Creatinine, Ser 1.16 (H) 0.57 - 1.00 mg/dL   eGFR 51 (L) >59 mL/min/1.73   BUN/Creatinine Ratio 23 12 - 28   Sodium 143 134 - 144 mmol/L   Potassium 4.3 3.5 - 5.2 mmol/L   Chloride 99 96 - 106 mmol/L   CO2 23 20 - 29 mmol/L   Calcium 9.4 8.7 - 10.3 mg/dL   Total Protein 6.9 6.0 - 8.5 g/dL   Albumin 4.3 3.8 - 4.8 g/dL   Globulin, Total 2.6 1.5 - 4.5 g/dL   Albumin/Globulin Ratio 1.7 1.2 - 2.2   Bilirubin Total 0.9 0.0 - 1.2 mg/dL   Alkaline Phosphatase 78 44 - 121 IU/L   AST 27 0 - 40 IU/L   ALT 21 0 - 32 IU/L      Assessment & Plan:   Problem List Items Addressed This Visit       Respiratory   COPD (chronic obstructive pulmonary disease) ([place]) - Primary (Chronic)    Patient's oxygen was 85% when she got into the exam room.  She was placed on 3L O2 which is what she uses at home PRN. Sats returned to 98%.  Patient states when it is very cold in a building she has trouble breathing. Only uses O2 PRN at home.          Other   Medication management    Reviewed medication list with patient. Discussed what each medication was for. Can d/c gabapentin if no longer having nerve pain. Can d/c omeprazole if not having GERD. Patient agrees and plans to stop medications. Will leave on her list until follow up to see if she continues to stop taking medications.         Follow up plan: Return if symptoms worsen or fail to improve.   A total of 30 minutes were spent on this encounter today.  When total time is documented, this includes both the face-to-face and non-face-to-face time personally spent before, during and after the visit on the date of the encounter.

## 2020-06-25 NOTE — Assessment & Plan Note (Signed)
Patient's oxygen was 85% when she got into the exam room.  She was placed on 3L O2 which is what she uses at home PRN. Sats returned to 98%. Patient states when it is very cold in a building she has trouble breathing. Only uses O2 PRN at home.

## 2020-06-25 NOTE — Assessment & Plan Note (Signed)
Reviewed medication list with patient. Discussed what each medication was for. Can d/c gabapentin if no longer having nerve pain. Can d/c omeprazole if not having GERD. Patient agrees and plans to stop medications. Will leave on her list until follow up to see if she continues to stop taking medications.

## 2020-07-01 DIAGNOSIS — J449 Chronic obstructive pulmonary disease, unspecified: Secondary | ICD-10-CM | POA: Diagnosis not present

## 2020-07-01 DIAGNOSIS — I5022 Chronic systolic (congestive) heart failure: Secondary | ICD-10-CM | POA: Diagnosis not present

## 2020-07-07 ENCOUNTER — Encounter: Payer: Self-pay | Admitting: Internal Medicine

## 2020-07-07 ENCOUNTER — Other Ambulatory Visit: Payer: Self-pay

## 2020-07-07 ENCOUNTER — Ambulatory Visit: Payer: Self-pay | Admitting: *Deleted

## 2020-07-07 ENCOUNTER — Ambulatory Visit (INDEPENDENT_AMBULATORY_CARE_PROVIDER_SITE_OTHER): Payer: Medicare Other | Admitting: Internal Medicine

## 2020-07-07 DIAGNOSIS — U071 COVID-19: Secondary | ICD-10-CM

## 2020-07-07 NOTE — Telephone Encounter (Signed)
Patient's daughter , [name] called to report patient's worsening symptoms. Tested today at home covid test and tested positive. Patient has hx CHF, defibrillator. Sx since Saturday 07/03/20 or Sunday 07/04/20. Patient now staying with daughter due to sx of covid. C/o fatigue, fever, 99.5, headache comes and goes, sore throat, decreased appetite and shortness of breath at rest on oxygen 3 L/min. Denies chest pain , pressure or cough. Has been vaccinated and booster [employer].  Virtual Appt scheduled today at 3:20 with Dr. Neomia Dear. Reviewed isolation precautions with daughter and she should be tested  for covid . Care advise given. Patient verbalized understanding of care advise and to call back or go to ED if symptoms worsen.

## 2020-07-07 NOTE — Telephone Encounter (Signed)
Reason for Disposition  MILD difficulty breathing (e.g., minimal/no SOB at rest, SOB with walking, pulse <100)  Answer Assessment - Initial Assessment Questions 1. COVID-19 DIAGNOSIS: "Who made your COVID-19 diagnosis?" "Was it confirmed by a positive lab test or self-test?" If not diagnosed by a doctor (or NP/PA), ask "Are there lots of cases (community spread) where you live?" Note: See public health department website, if unsure.     At home test positive  2. COVID-19 EXPOSURE: "Was there any known exposure to COVID before the symptoms began?" CDC Definition of close contact: within 6 feet (2 meters) for a total of 15 minutes or more over a 24-hour period.      Not sure maybe patient 's son per patient's daughter. 3. ONSET: "When did the COVID-19 symptoms start?"      07/03/20  Saturday or Sunday 07/04/20  4. WORST SYMPTOM: "What is your worst symptom?" (e.g., cough, fever, shortness of breath, muscle aches)     Shortness of breath ,cough , fever, fatigue  5. COUGH: "Do you have a cough?" If Yes, ask: "How bad is the cough?"       No  6. FEVER: "Do you have a fever?" If Yes, ask: "What is your temperature, how was it measured, and when did it start?"     Yes 99.5 7. RESPIRATORY STATUS: "Describe your breathing?" (e.g., shortness of breath, wheezing, unable to speak)      Shortness of breath. 8. BETTER-SAME-WORSE: "Are you getting better, staying the same or getting worse compared to yesterday?"  If getting worse, ask, "In what way?"     Worse today  9. HIGH RISK DISEASE: "Do you have any chronic medical problems?" (e.g., asthma, heart or lung disease, weak immune system, obesity, etc.)     CHF defibrillator  10. VACCINE: "Have you had the COVID-19 vaccine?" If Yes, ask: "Which one, how many shots, when did you get it?"       X 2 Pfizer  11. BOOSTER: "Have you received your COVID-19 booster?" If Yes, ask: "Which one and when did you get it?"       Yes Pfizer  12. PREGNANCY: "Is there any  chance you are pregnant?" "When was your last menstrual period?"       na 13. OTHER SYMPTOMS: "Do you have any other symptoms?"  (e.g., chills, fatigue, headache, loss of smell or taste, muscle pain, sore throat)       Fatigue , headache, muscle pain , sore throat  14. O2 SATURATION MONITOR:  "Do you use an oxygen saturation monitor (pulse oximeter) at home?" If Yes, ask "What is your reading (oxygen level) today?" "What is your usual oxygen saturation reading?" (e.g., 95%)       On  Oxygen on 3L/min. Does not have pulse ox to measure saturation per daughter  Protocols used: Coronavirus (BXUXY-33) Diagnosed or Suspected-A-AH

## 2020-07-07 NOTE — Telephone Encounter (Signed)
FYI scheduled today

## 2020-07-07 NOTE — Progress Notes (Signed)
There were no vitals taken for this visit.   Subjective:    Patient ID: Maria Neal, female    DOB: 1950-06-03, 69 y.o.   MRN: [id number]  Chief Complaint  Patient presents with   Covid Positive    Tested positive today at home, Patient states that she is a little weak. Last Covid vaccination was 11/24/19 Sande Rives)   COPD    HPI: Maria Neal is a 70 y.o. female   This visit was completed via telephone due to the restrictions of the COVID-19 pandemic. All issues as above were discussed and addressed but no physical exam was performed. If it was felt that the patient should be evaluated in the office, they were directed there. The patient verbally consented to this visit. Patient was unable to complete an audio/visual visit due to Technical difficulties. Due to the catastrophic nature of the COVID-19 pandemic, this visit was done through audio contact only. Location of the patient: home Location of the provider: work Those involved with this call:  Provider: Charlynne Cousins, MD CMA: Frazier Butt, [place] Desk/Registration: Jill Side  Time spent on call: 10 minutes on the phone discussing health concerns. 10 minutes total spent in review of patient's record and preparation of their chart.   Daughter took her covid test,  and she took a test as well and she tested positive today.   COPD There is no chest tightness, cough, difficulty breathing, frequent throat clearing, hemoptysis, hoarse voice, shortness of breath, sputum production or wheezing. Her past medical history is significant for COPD.   Chief Complaint  Patient presents with   Covid Positive    Tested positive today at home, Patient states that she is a little weak. Last Covid vaccination was 11/24/19 Sande Rives)   COPD    Relevant past medical, surgical, family and social history reviewed and updated as indicated. Interim medical history since our last visit reviewed. Allergies and medications reviewed and  updated.  Review of Systems  HENT:  Negative for hoarse voice.   Respiratory:  Negative for cough, hemoptysis, sputum production, shortness of breath and wheezing.    Per HPI unless specifically indicated above     Objective:    There were no vitals taken for this visit.  Wt Readings from Last 3 Encounters:  06/25/20 187 lb 2 oz (84.9 kg)  05/03/20 183 lb 4 oz (83.1 kg)  03/10/20 179 lb 9.6 oz (81.5 kg)    Physical Exam  Unable to peform sec to virtual visit.   Results for orders placed or performed in visit on 05/13/20  Comp Met (CMET)  Result Value Ref Range   Glucose 107 (H) 65 - 99 mg/dL   BUN 27 8 - 27 mg/dL   Creatinine, Ser 1.16 (H) 0.57 - 1.00 mg/dL   eGFR 51 (L) >59 mL/min/1.73   BUN/Creatinine Ratio 23 12 - 28   Sodium 143 134 - 144 mmol/L   Potassium 4.3 3.5 - 5.2 mmol/L   Chloride 99 96 - 106 mmol/L   CO2 23 20 - 29 mmol/L   Calcium 9.4 8.7 - 10.3 mg/dL   Total Protein 6.9 6.0 - 8.5 g/dL   Albumin 4.3 3.8 - 4.8 g/dL   Globulin, Total 2.6 1.5 - 4.5 g/dL   Albumin/Globulin Ratio 1.7 1.2 - 2.2   Bilirubin Total 0.9 0.0 - 1.2 mg/dL   Alkaline Phosphatase 78 44 - 121 IU/L   AST 27 0 - 40 IU/L   ALT 21  0 - 32 IU/L        Current Outpatient Medications:    albuterol (PROVENTIL HFA;VENTOLIN HFA) 108 (90 Base) MCG/ACT inhaler, Inhale 2 puffs into the lungs every 6 (six) hours as needed for wheezing or shortness of breath., Disp: , Rfl:    aspirin EC 81 MG tablet, Take 81 mg by mouth daily., Disp: , Rfl:    carvedilol (COREG) 25 MG tablet, Take 25 mg by mouth 2 (two) times daily., Disp: , Rfl:    cyclobenzaprine (FLEXERIL) 5 MG tablet, TAKE 1 TABLET BY MOUTH 3 TIMES DAILY AS NEEDED, Disp: 30 tablet, Rfl: 0   diclofenac Sodium (VOLTAREN) 1 % GEL, Apply topically., Disp: , Rfl:    fluticasone (FLONASE) 50 MCG/ACT nasal spray, Place 2 sprays into both nostrils daily., Disp: 16 g, Rfl: 6   Fluticasone-Umeclidin-Vilant (TRELEGY ELLIPTA) 100-62.5-25 MCG/INH AEPB,  Inhale into the lungs., Disp: , Rfl:    furosemide (LASIX) 40 MG tablet, Take 1 tablet (40 mg total) by mouth daily., Disp: 90 tablet, Rfl: 1   gabapentin (NEURONTIN) 300 MG capsule, TAKE 1 CAPSULE BY MOUTH 3 TIMES DAILY, Disp: 270 capsule, Rfl: 1   loratadine (CLARITIN) 10 MG tablet, Take 1 tablet (10 mg total) by mouth daily., Disp: 30 tablet, Rfl: 5   losartan (COZAAR) 50 MG tablet, Take 50 mg by mouth daily., Disp: , Rfl:    nystatin cream (MYCOSTATIN), Apply 1 application topically 2 (two) times daily., Disp: 30 g, Rfl: 0   omeprazole (PRILOSEC) 20 MG capsule, Take 1 capsule (20 mg total) by mouth daily., Disp: 30 capsule, Rfl: 3   OXYGEN, Inhale 3 L into the lungs 3 (three) times daily as needed (shortness of breath or coughing)., Disp: , Rfl:    spironolactone (ALDACTONE) 25 MG tablet, Take 12.5 mg by mouth daily., Disp: , Rfl:    triamcinolone (KENALOG) 0.025 % cream, Apply 1 application topically 2 (two) times daily., Disp: 30 g, Rfl: 0    Assessment & Plan:  COVID : positive : asymptomatic, checked just because her duaghter was checking per her verbal record.  Pt will need to quarantine x  5 days Ok to rtw in 5 days if tests -ve follow . Increase fluid intake.  If she develops symptoms she needs to call the office and d/w her pcp. .    Problem List Items Addressed This Visit   None   No orders of the defined types were placed in this encounter.    No orders of the defined types were placed in this encounter.    Follow up plan: No follow-ups on file.

## 2020-07-13 ENCOUNTER — Encounter: Payer: Self-pay | Admitting: Nurse Practitioner

## 2020-07-19 ENCOUNTER — Telehealth: Payer: Self-pay

## 2020-07-19 ENCOUNTER — Ambulatory Visit (INDEPENDENT_AMBULATORY_CARE_PROVIDER_SITE_OTHER): Payer: Medicare Other | Admitting: Nurse Practitioner

## 2020-07-19 ENCOUNTER — Encounter: Payer: Self-pay | Admitting: Nurse Practitioner

## 2020-07-19 ENCOUNTER — Other Ambulatory Visit: Payer: Self-pay

## 2020-07-19 DIAGNOSIS — J441 Chronic obstructive pulmonary disease with (acute) exacerbation: Secondary | ICD-10-CM | POA: Diagnosis not present

## 2020-07-19 MED ORDER — DOXYCYCLINE HYCLATE 100 MG PO TABS: 100.0000 mg | ORAL_TABLET | Freq: Two times a day (BID) | ORAL | 0 refills | Status: DC

## 2020-07-19 MED ORDER — BENZONATATE 200 MG PO CAPS: 200.0000 mg | ORAL_CAPSULE | Freq: Two times a day (BID) | ORAL | 0 refills | Status: DC | PRN

## 2020-07-19 NOTE — Telephone Encounter (Signed)
Scheduled today

## 2020-07-19 NOTE — Telephone Encounter (Signed)
Copied from [place] [id number]. Topic: General - Other >> Jul 19, 2020  2:58 PM Leward Quan A wrote: Reason for CRM: Patient daughter [name] called in to inform Jon Billings that patient is still having a productive cough coughing up mucus and her throat is really raw would like an Rx called in to help clear this up because she have an appointment at a different office and would like to be able to attend without the coughing. Please call with an answer to Ph# [phone]

## 2020-07-19 NOTE — Progress Notes (Signed)
 There were no vitals taken for this visit.   Subjective:    Patient ID: Maria Neal, female    DOB: 04/21/1950, 70 y.o.   MRN: [id number]  HPI: Maria Neal is a 70 y.o. female  Chief Complaint  Patient presents with   Cough    X 3 days, sore throat at times due to the cough   UPPER RESPIRATORY TRACT INFECTION Worst symptom: cough x 3 days Fever: no Cough: yes Shortness of breath: no Wheezing: no Chest pain: no Chest tightness: no Chest congestion: yes Nasal congestion: yes Runny nose: no Post nasal drip: yes Sneezing: no Sore throat: yes Swollen glands: no Sinus pressure: no Headache: no Face pain: no Toothache: no Ear pain: no bilateral Ear pressure: no bilateral Eyes red/itching:no Eye drainage/crusting: no  Vomiting: no Rash: no Fatigue: yes Sick contacts: no Strep contacts: no  Context: stable Recurrent sinusitis: no Relief with OTC cold/cough medications: no  Treatments attempted: none   Relevant past medical, surgical, family and social history reviewed and updated as indicated. Interim medical history since our last visit reviewed. Allergies and medications reviewed and updated.  Review of Systems  Constitutional:  Positive for fatigue. Negative for fever.  HENT:  Positive for congestion, postnasal drip and sore throat. Negative for dental problem, ear pain, rhinorrhea, sinus pressure, sinus pain and sneezing.   Respiratory:  Positive for cough. Negative for shortness of breath and wheezing.   Cardiovascular:  Negative for chest pain.  Gastrointestinal:  Negative for vomiting.  Skin:  Negative for rash.  Neurological:  Negative for headaches.   Per HPI unless specifically indicated above     Objective:    There were no vitals taken for this visit.  Wt Readings from Last 3 Encounters:  06/25/20 187 lb 2 oz (84.9 kg)  05/03/20 183 lb 4 oz (83.1 kg)  03/10/20 179 lb 9.6 oz (81.5 kg)    Physical Exam Vitals and nursing note reviewed.   Pulmonary:     Effort: Pulmonary effort is normal. No respiratory distress.  Neurological:     Mental Status: She is alert.  Psychiatric:        Mood and Affect: Mood normal.        Behavior: Behavior normal.        Thought Content: Thought content normal.        Judgment: Judgment normal.    Results for orders placed or performed in visit on 05/13/20  Comp Met (CMET)  Result Value Ref Range   Glucose 107 (H) 65 - 99 mg/dL   BUN 27 8 - 27 mg/dL   Creatinine, Ser 1.16 (H) 0.57 - 1.00 mg/dL   eGFR 51 (L) >59 mL/min/1.73   BUN/Creatinine Ratio 23 12 - 28   Sodium 143 134 - 144 mmol/L   Potassium 4.3 3.5 - 5.2 mmol/L   Chloride 99 96 - 106 mmol/L   CO2 23 20 - 29 mmol/L   Calcium 9.4 8.7 - 10.3 mg/dL   Total Protein 6.9 6.0 - 8.5 g/dL   Albumin 4.3 3.8 - 4.8 g/dL   Globulin, Total 2.6 1.5 - 4.5 g/dL   Albumin/Globulin Ratio 1.7 1.2 - 2.2   Bilirubin Total 0.9 0.0 - 1.2 mg/dL   Alkaline Phosphatase 78 44 - 121 IU/L   AST 27 0 - 40 IU/L   ALT 21 0 - 32 IU/L      Assessment & Plan:   Problem List Items Addressed This Visit     None Visit Diagnoses     COPD exacerbation ([facility])    -  Primary   Patient treated with doxycycline. Given tessalon for cough. S/S of when to follow up discussed with patient during visit. Return to [facility] if symptoms worsen.   Relevant Medications   benzonatate (TESSALON) 200 MG capsule        Follow up plan: Return if symptoms worsen or fail to improve.    This visit was completed via MyChart due to the restrictions of the COVID-19 pandemic. All issues as above were discussed and addressed. Physical exam was done as above through visual confirmation on MyChart. If it was felt that the patient should be evaluated in the office, they were directed there. The patient verbally consented to this visit. Location of the patient: Home Location of the provider: Office Those involved with this call:  Provider:  , NP CMA: Tiffany Reel,  CMA Front Desk/Registration: Joliza Johnson This encounter was conducted via phone.  I spent 15 dedicated to the care of this patient on the date of this encounter to include previsit review of 21, time with the patient, and post visit ordering of testing.      

## 2020-07-27 ENCOUNTER — Telehealth: Payer: Self-pay | Admitting: Nurse Practitioner

## 2020-07-27 NOTE — Telephone Encounter (Signed)
Copied from [place] [phone]. Topic: Medicare AWV >> Jul 27, 2020  1:55 PM Cher Nakai R wrote: Reason for CRM:  No answer unable to leave a message for patient to call back and schedule the Medicare Annual Wellness Visit (AWV) virtually or by telephone.  Last AWV 04/30/2019  Please schedule at anytime with [facility]Nurse Health Advisor.  45 minute appointment  Any questions, please call me at [phone]

## 2020-08-01 DIAGNOSIS — J449 Chronic obstructive pulmonary disease, unspecified: Secondary | ICD-10-CM | POA: Diagnosis not present

## 2020-08-01 DIAGNOSIS — I5022 Chronic systolic (congestive) heart failure: Secondary | ICD-10-CM | POA: Diagnosis not present

## 2020-08-02 DIAGNOSIS — I10 Essential (primary) hypertension: Secondary | ICD-10-CM | POA: Diagnosis not present

## 2020-08-02 DIAGNOSIS — J441 Chronic obstructive pulmonary disease with (acute) exacerbation: Secondary | ICD-10-CM | POA: Diagnosis not present

## 2020-08-02 DIAGNOSIS — Z95810 Presence of automatic (implantable) cardiac defibrillator: Secondary | ICD-10-CM | POA: Diagnosis not present

## 2020-08-02 DIAGNOSIS — I8311 Varicose veins of right lower extremity with inflammation: Secondary | ICD-10-CM | POA: Diagnosis not present

## 2020-08-02 DIAGNOSIS — Z9889 Other specified postprocedural states: Secondary | ICD-10-CM | POA: Diagnosis not present

## 2020-08-02 DIAGNOSIS — I8312 Varicose veins of left lower extremity with inflammation: Secondary | ICD-10-CM | POA: Diagnosis not present

## 2020-08-02 DIAGNOSIS — I447 Left bundle-branch block, unspecified: Secondary | ICD-10-CM | POA: Diagnosis not present

## 2020-08-02 DIAGNOSIS — R0602 Shortness of breath: Secondary | ICD-10-CM | POA: Diagnosis not present

## 2020-08-02 DIAGNOSIS — I428 Other cardiomyopathies: Secondary | ICD-10-CM | POA: Diagnosis not present

## 2020-08-02 DIAGNOSIS — I5022 Chronic systolic (congestive) heart failure: Secondary | ICD-10-CM | POA: Diagnosis not present

## 2020-08-02 DIAGNOSIS — J410 Simple chronic bronchitis: Secondary | ICD-10-CM | POA: Diagnosis not present

## 2020-08-07 ENCOUNTER — Inpatient Hospital Stay (HOSPITAL_COMMUNITY): Payer: Medicare Other

## 2020-08-07 ENCOUNTER — Emergency Department (HOSPITAL_COMMUNITY): Payer: Medicare Other

## 2020-08-07 ENCOUNTER — Inpatient Hospital Stay (HOSPITAL_COMMUNITY)
Admission: EM | Admit: 2020-08-07 | Discharge: 2020-08-13 | DRG: 291 | Disposition: A | Discharge status: Home Health | Payer: Medicare Other | Attending: Cardiovascular Disease | Admitting: Cardiovascular Disease

## 2020-08-07 ENCOUNTER — Encounter (HOSPITAL_COMMUNITY): Payer: Self-pay | Admitting: Emergency Medicine

## 2020-08-07 DIAGNOSIS — E785 Hyperlipidemia, unspecified: Secondary | ICD-10-CM | POA: Diagnosis not present

## 2020-08-07 DIAGNOSIS — R4781 Slurred speech: Secondary | ICD-10-CM | POA: Diagnosis present

## 2020-08-07 DIAGNOSIS — I5043 Acute on chronic combined systolic (congestive) and diastolic (congestive) heart failure: Secondary | ICD-10-CM | POA: Diagnosis present

## 2020-08-07 DIAGNOSIS — J449 Chronic obstructive pulmonary disease, unspecified: Secondary | ICD-10-CM | POA: Diagnosis present

## 2020-08-07 DIAGNOSIS — Z87442 Personal history of urinary calculi: Secondary | ICD-10-CM

## 2020-08-07 DIAGNOSIS — E7800 Pure hypercholesterolemia, unspecified: Secondary | ICD-10-CM | POA: Diagnosis present

## 2020-08-07 DIAGNOSIS — I5023 Acute on chronic systolic (congestive) heart failure: Secondary | ICD-10-CM | POA: Diagnosis not present

## 2020-08-07 DIAGNOSIS — Z880 Allergy status to penicillin: Secondary | ICD-10-CM

## 2020-08-07 DIAGNOSIS — N200 Calculus of kidney: Secondary | ICD-10-CM | POA: Diagnosis not present

## 2020-08-07 DIAGNOSIS — N1831 Chronic kidney disease, stage 3a: Secondary | ICD-10-CM | POA: Diagnosis present

## 2020-08-07 DIAGNOSIS — I4821 Permanent atrial fibrillation: Secondary | ICD-10-CM | POA: Diagnosis not present

## 2020-08-07 DIAGNOSIS — S0990XA Unspecified injury of head, initial encounter: Secondary | ICD-10-CM | POA: Diagnosis not present

## 2020-08-07 DIAGNOSIS — F419 Anxiety disorder, unspecified: Secondary | ICD-10-CM | POA: Diagnosis present

## 2020-08-07 DIAGNOSIS — I4819 Other persistent atrial fibrillation: Secondary | ICD-10-CM | POA: Diagnosis present

## 2020-08-07 DIAGNOSIS — I130 Hypertensive heart and chronic kidney disease with heart failure and stage 1 through stage 4 chronic kidney disease, or unspecified chronic kidney disease: Principal | ICD-10-CM | POA: Diagnosis present

## 2020-08-07 DIAGNOSIS — Z9889 Other specified postprocedural states: Secondary | ICD-10-CM | POA: Diagnosis not present

## 2020-08-07 DIAGNOSIS — F32A Depression, unspecified: Secondary | ICD-10-CM | POA: Diagnosis not present

## 2020-08-07 DIAGNOSIS — I1310 Hypertensive heart and chronic kidney disease without heart failure, with stage 1 through stage 4 chronic kidney disease, or unspecified chronic kidney disease: Secondary | ICD-10-CM

## 2020-08-07 DIAGNOSIS — I2510 Atherosclerotic heart disease of native coronary artery without angina pectoris: Secondary | ICD-10-CM | POA: Diagnosis present

## 2020-08-07 DIAGNOSIS — Z7982 Long term (current) use of aspirin: Secondary | ICD-10-CM

## 2020-08-07 DIAGNOSIS — Z833 Family history of diabetes mellitus: Secondary | ICD-10-CM

## 2020-08-07 DIAGNOSIS — R109 Unspecified abdominal pain: Secondary | ICD-10-CM

## 2020-08-07 DIAGNOSIS — I071 Rheumatic tricuspid insufficiency: Secondary | ICD-10-CM | POA: Diagnosis not present

## 2020-08-07 DIAGNOSIS — U071 COVID-19: Secondary | ICD-10-CM | POA: Diagnosis not present

## 2020-08-07 DIAGNOSIS — G4700 Insomnia, unspecified: Secondary | ICD-10-CM | POA: Diagnosis present

## 2020-08-07 DIAGNOSIS — N179 Acute kidney failure, unspecified: Secondary | ICD-10-CM

## 2020-08-07 DIAGNOSIS — Z95810 Presence of automatic (implantable) cardiac defibrillator: Secondary | ICD-10-CM | POA: Diagnosis not present

## 2020-08-07 DIAGNOSIS — Z8261 Family history of arthritis: Secondary | ICD-10-CM

## 2020-08-07 DIAGNOSIS — I081 Rheumatic disorders of both mitral and tricuspid valves: Secondary | ICD-10-CM | POA: Diagnosis not present

## 2020-08-07 DIAGNOSIS — E8779 Other fluid overload: Secondary | ICD-10-CM

## 2020-08-07 DIAGNOSIS — R0602 Shortness of breath: Secondary | ICD-10-CM | POA: Diagnosis not present

## 2020-08-07 DIAGNOSIS — E669 Obesity, unspecified: Secondary | ICD-10-CM | POA: Diagnosis present

## 2020-08-07 DIAGNOSIS — I361 Nonrheumatic tricuspid (valve) insufficiency: Secondary | ICD-10-CM | POA: Diagnosis not present

## 2020-08-07 DIAGNOSIS — I110 Hypertensive heart disease with heart failure: Secondary | ICD-10-CM | POA: Diagnosis not present

## 2020-08-07 DIAGNOSIS — I4891 Unspecified atrial fibrillation: Secondary | ICD-10-CM | POA: Diagnosis not present

## 2020-08-07 DIAGNOSIS — I509 Heart failure, unspecified: Secondary | ICD-10-CM

## 2020-08-07 DIAGNOSIS — J9621 Acute and chronic respiratory failure with hypoxia: Secondary | ICD-10-CM | POA: Diagnosis present

## 2020-08-07 DIAGNOSIS — Z87891 Personal history of nicotine dependence: Secondary | ICD-10-CM

## 2020-08-07 DIAGNOSIS — Z818 Family history of other mental and behavioral disorders: Secondary | ICD-10-CM

## 2020-08-07 DIAGNOSIS — W08XXXA Fall from other furniture, initial encounter: Secondary | ICD-10-CM | POA: Diagnosis present

## 2020-08-07 DIAGNOSIS — Z8616 Personal history of COVID-19: Secondary | ICD-10-CM | POA: Diagnosis not present

## 2020-08-07 DIAGNOSIS — Z950 Presence of cardiac pacemaker: Secondary | ICD-10-CM | POA: Diagnosis not present

## 2020-08-07 DIAGNOSIS — R609 Edema, unspecified: Secondary | ICD-10-CM | POA: Diagnosis not present

## 2020-08-07 DIAGNOSIS — Z8249 Family history of ischemic heart disease and other diseases of the circulatory system: Secondary | ICD-10-CM

## 2020-08-07 DIAGNOSIS — G2581 Restless legs syndrome: Secondary | ICD-10-CM | POA: Diagnosis not present

## 2020-08-07 DIAGNOSIS — I472 Ventricular tachycardia: Secondary | ICD-10-CM | POA: Diagnosis present

## 2020-08-07 DIAGNOSIS — Z6834 Body mass index (BMI) 34.0-34.9, adult: Secondary | ICD-10-CM

## 2020-08-07 DIAGNOSIS — I5021 Acute systolic (congestive) heart failure: Secondary | ICD-10-CM | POA: Diagnosis present

## 2020-08-07 DIAGNOSIS — S8992XA Unspecified injury of left lower leg, initial encounter: Secondary | ICD-10-CM | POA: Diagnosis not present

## 2020-08-07 DIAGNOSIS — I420 Dilated cardiomyopathy: Secondary | ICD-10-CM | POA: Diagnosis not present

## 2020-08-07 DIAGNOSIS — Y929 Unspecified place or not applicable: Secondary | ICD-10-CM

## 2020-08-07 DIAGNOSIS — M7989 Other specified soft tissue disorders: Secondary | ICD-10-CM | POA: Diagnosis not present

## 2020-08-07 DIAGNOSIS — I517 Cardiomegaly: Secondary | ICD-10-CM | POA: Diagnosis not present

## 2020-08-07 DIAGNOSIS — E860 Dehydration: Secondary | ICD-10-CM | POA: Diagnosis present

## 2020-08-07 DIAGNOSIS — R799 Abnormal finding of blood chemistry, unspecified: Secondary | ICD-10-CM

## 2020-08-07 DIAGNOSIS — R52 Pain, unspecified: Secondary | ICD-10-CM

## 2020-08-07 DIAGNOSIS — Z79899 Other long term (current) drug therapy: Secondary | ICD-10-CM

## 2020-08-07 DIAGNOSIS — Z888 Allergy status to other drugs, medicaments and biological substances status: Secondary | ICD-10-CM

## 2020-08-07 DIAGNOSIS — I5022 Chronic systolic (congestive) heart failure: Secondary | ICD-10-CM | POA: Diagnosis not present

## 2020-08-07 DIAGNOSIS — I083 Combined rheumatic disorders of mitral, aortic and tricuspid valves: Secondary | ICD-10-CM | POA: Diagnosis not present

## 2020-08-07 DIAGNOSIS — R519 Headache, unspecified: Secondary | ICD-10-CM | POA: Diagnosis not present

## 2020-08-07 DIAGNOSIS — I340 Nonrheumatic mitral (valve) insufficiency: Secondary | ICD-10-CM | POA: Diagnosis not present

## 2020-08-07 LAB — COMPREHENSIVE METABOLIC PANEL
ALT: 10 U/L (ref 0–44)
AST: 16 U/L (ref 15–41)
Albumin: 3.3 g/dL — ABNORMAL LOW (ref 3.5–5.0)
Alkaline Phosphatase: 57 U/L (ref 38–126)
Anion gap: 10 (ref 5–15)
BUN: 63 mg/dL — ABNORMAL HIGH (ref 8–23)
CO2: 27 mmol/L (ref 22–32)
Calcium: 9.2 mg/dL (ref 8.9–10.3)
Chloride: 99 mmol/L (ref 98–111)
Creatinine, Ser: 2.5 mg/dL — ABNORMAL HIGH (ref 0.44–1.00)
GFR, Estimated: 20 mL/min — ABNORMAL LOW (ref >60)
Glucose, Bld: 112 mg/dL — ABNORMAL HIGH (ref 70–99)
Potassium: 5.1 mmol/L (ref 3.5–5.1)
Sodium: 136 mmol/L (ref 135–145)
Total Bilirubin: 1.4 mg/dL — ABNORMAL HIGH (ref 0.3–1.2)
Total Protein: 6.7 g/dL (ref 6.5–8.1)

## 2020-08-07 LAB — CBC
HCT: 38.4 % (ref 36.0–46.0)
HCT: 37.7 % (ref 36.0–46.0)
Hemoglobin: 11.6 g/dL — ABNORMAL LOW (ref 12.0–15.0)
Hemoglobin: 11.8 g/dL — ABNORMAL LOW (ref 12.0–15.0)
MCH: 27.6 pg (ref 26.0–34.0)
MCH: 28.4 pg (ref 26.0–34.0)
MCHC: 30.2 g/dL (ref 30.0–36.0)
MCHC: 31.3 g/dL (ref 30.0–36.0)
MCV: 91.2 fL (ref 80.0–100.0)
MCV: 90.8 fL (ref 80.0–100.0)
Platelets: 151 10*3/uL (ref 150–400)
Platelets: 143 10*3/uL — ABNORMAL LOW (ref 150–400)
RBC: 4.21 MIL/uL (ref 3.87–5.11)
RBC: 4.15 MIL/uL (ref 3.87–5.11)
RDW: 16.9 % — ABNORMAL HIGH (ref 11.5–15.5)
RDW: 16.7 % — ABNORMAL HIGH (ref 11.5–15.5)
WBC: 4.5 10*3/uL (ref 4.0–10.5)
WBC: 4.5 10*3/uL (ref 4.0–10.5)
nRBC: 0 % (ref 0.0–0.2)
nRBC: 0 % (ref 0.0–0.2)

## 2020-08-07 LAB — APTT: aPTT: 33 seconds (ref 24–36)

## 2020-08-07 LAB — DIFFERENTIAL
Abs Immature Granulocytes: 0.01 10*3/uL (ref 0.00–0.07)
Basophils Absolute: 0 10*3/uL (ref 0.0–0.1)
Basophils Relative: 1 %
Eosinophils Absolute: 0.1 10*3/uL (ref 0.0–0.5)
Eosinophils Relative: 3 %
Immature Granulocytes: 0 %
Lymphocytes Relative: 24 %
Lymphs Abs: 1.1 10*3/uL (ref 0.7–4.0)
Monocytes Absolute: 0.6 10*3/uL (ref 0.1–1.0)
Monocytes Relative: 14 %
Neutro Abs: 2.6 10*3/uL (ref 1.7–7.7)
Neutrophils Relative %: 58 %

## 2020-08-07 LAB — CREATININE, SERUM
Creatinine, Ser: 2.55 mg/dL — ABNORMAL HIGH (ref 0.44–1.00)
GFR, Estimated: 20 mL/min — ABNORMAL LOW (ref >60)

## 2020-08-07 LAB — RESP PANEL BY RT-PCR (FLU A&B, COVID) ARPGX2
Influenza A by PCR: NEGATIVE
Influenza B by PCR: NEGATIVE
SARS Coronavirus 2 by RT PCR: POSITIVE — AB

## 2020-08-07 LAB — CBG MONITORING, ED: Glucose-Capillary: 115 mg/dL — ABNORMAL HIGH (ref 70–99)

## 2020-08-07 LAB — PROTIME-INR
INR: 1.2 (ref 0.8–1.2)
Prothrombin Time: 15.4 seconds — ABNORMAL HIGH (ref 11.4–15.2)

## 2020-08-07 LAB — BRAIN NATRIURETIC PEPTIDE: B Natriuretic Peptide: 1647.8 pg/mL — ABNORMAL HIGH (ref 0.0–100.0)

## 2020-08-07 LAB — I-STAT CHEM 8, ED
BUN: 57 mg/dL — ABNORMAL HIGH (ref 8–23)
Calcium, Ion: 1.11 mmol/L — ABNORMAL LOW (ref 1.15–1.40)
Chloride: 99 mmol/L (ref 98–111)
Creatinine, Ser: 2.4 mg/dL — ABNORMAL HIGH (ref 0.44–1.00)
Glucose, Bld: 113 mg/dL — ABNORMAL HIGH (ref 70–99)
HCT: 39 % (ref 36.0–46.0)
Hemoglobin: 13.3 g/dL (ref 12.0–15.0)
Potassium: 5 mmol/L (ref 3.5–5.1)
Sodium: 136 mmol/L (ref 135–145)
TCO2: 29 mmol/L (ref 22–32)

## 2020-08-07 LAB — TROPONIN I (HIGH SENSITIVITY)
Troponin I (High Sensitivity): 61 ng/L — ABNORMAL HIGH (ref <18)
Troponin I (High Sensitivity): 62 ng/L — ABNORMAL HIGH (ref <18)

## 2020-08-07 LAB — HIV ANTIBODY (ROUTINE TESTING W REFLEX): HIV Screen 4th Generation wRfx: NONREACTIVE

## 2020-08-07 MED ORDER — SODIUM CHLORIDE 0.9% FLUSH: 3.0000 mL | Freq: Once | INTRAVENOUS | Status: DC

## 2020-08-07 MED ORDER — ACETAMINOPHEN 325 MG PO TABS
650.0000 mg | ORAL_TABLET | ORAL | Status: DC | PRN
Start: 2020-08-07 — End: 2020-08-13
  Administered 2020-08-12: 650 mg via ORAL
  Filled 2020-08-07: qty 2

## 2020-08-07 MED ORDER — SODIUM CHLORIDE 0.9 % IV SOLN: 250.0000 mL | INTRAVENOUS | Status: DC

## 2020-08-07 MED ORDER — BENZONATATE 100 MG PO CAPS: 200.0000 mg | ORAL_CAPSULE | Freq: Two times a day (BID) | ORAL | Status: DC | PRN

## 2020-08-07 MED ORDER — SPIRONOLACTONE 12.5 MG HALF TABLET
12.5000 mg | ORAL_TABLET | Freq: Every day | ORAL | Status: DC
  Administered 2020-08-08 – 2020-08-13 (×6): 12.5 mg via ORAL
  Filled 2020-08-07 (×8): qty 1

## 2020-08-07 MED ORDER — PANTOPRAZOLE SODIUM 40 MG PO TBEC
40.0000 mg | DELAYED_RELEASE_TABLET | Freq: Every day | ORAL | Status: DC
  Administered 2020-08-08 – 2020-08-13 (×6): 40 mg via ORAL
  Filled 2020-08-07 (×7): qty 1

## 2020-08-07 MED ORDER — ONDANSETRON HCL 4 MG/2ML IJ SOLN: 4.0000 mg | Freq: Four times a day (QID) | INTRAMUSCULAR | Status: DC | PRN

## 2020-08-07 MED ORDER — GABAPENTIN 300 MG PO CAPS
300.0000 mg | ORAL_CAPSULE | Freq: Three times a day (TID) | ORAL | Status: DC
  Administered 2020-08-07: 300 mg via ORAL
  Filled 2020-08-07: qty 1

## 2020-08-07 MED ORDER — LOSARTAN POTASSIUM 50 MG PO TABS: 50.0000 mg | ORAL_TABLET | Freq: Every day | ORAL | Status: DC

## 2020-08-07 MED ORDER — ALBUMIN HUMAN 25 % IV SOLN
12.5000 g | Freq: Once | INTRAVENOUS | Status: AC
  Administered 2020-08-07: 12.5 g via INTRAVENOUS
  Filled 2020-08-07: qty 50

## 2020-08-07 MED ORDER — NOREPINEPHRINE 4 MG/250ML-% IV SOLN: 2.0000 ug/min | INTRAVENOUS | Status: DC

## 2020-08-07 MED ORDER — SODIUM CHLORIDE 0.9 % IV SOLN: 250.0000 mL | INTRAVENOUS | Status: DC | PRN

## 2020-08-07 MED ORDER — SODIUM CHLORIDE 0.9% FLUSH: 3.0000 mL | INTRAVENOUS | Status: DC | PRN

## 2020-08-07 MED ORDER — FUROSEMIDE 10 MG/ML IJ SOLN
40.0000 mg | Freq: Once | INTRAMUSCULAR | Status: AC
  Administered 2020-08-07: 40 mg via INTRAVENOUS
  Filled 2020-08-07: qty 4

## 2020-08-07 MED ORDER — CARVEDILOL 25 MG PO TABS
25.0000 mg | ORAL_TABLET | Freq: Two times a day (BID) | ORAL | Status: DC
  Administered 2020-08-07: 25 mg via ORAL
  Filled 2020-08-07: qty 2

## 2020-08-07 MED ORDER — DICLOFENAC SODIUM 1 % EX GEL: 2.0000 g | Freq: Two times a day (BID) | CUTANEOUS | Status: DC | PRN

## 2020-08-07 MED ORDER — LORATADINE 10 MG PO TABS
10.0000 mg | ORAL_TABLET | Freq: Every day | ORAL | Status: DC
  Administered 2020-08-08 – 2020-08-13 (×6): 10 mg via ORAL
  Filled 2020-08-07 (×7): qty 1

## 2020-08-07 MED ORDER — HEPARIN SODIUM (PORCINE) 5000 UNIT/ML IJ SOLN
5000.0000 [IU] | Freq: Three times a day (TID) | INTRAMUSCULAR | Status: DC
  Administered 2020-08-07 – 2020-08-08 (×3): 5000 [IU] via SUBCUTANEOUS
  Filled 2020-08-07 (×3): qty 1

## 2020-08-07 MED ORDER — SODIUM CHLORIDE 0.9% FLUSH
3.0000 mL | Freq: Two times a day (BID) | INTRAVENOUS | Status: DC
  Administered 2020-08-07 – 2020-08-13 (×5): 3 mL via INTRAVENOUS

## 2020-08-07 MED ORDER — CYCLOBENZAPRINE HCL 10 MG PO TABS: 5.0000 mg | ORAL_TABLET | Freq: Three times a day (TID) | ORAL | Status: DC | PRN

## 2020-08-07 MED ORDER — LACTATED RINGERS IV BOLUS: 1000.0000 mL | Freq: Once | INTRAVENOUS | Status: DC

## 2020-08-07 MED ORDER — FUROSEMIDE 10 MG/ML IJ SOLN
40.0000 mg | Freq: Two times a day (BID) | INTRAMUSCULAR | Status: DC
  Administered 2020-08-08: 40 mg via INTRAVENOUS
  Filled 2020-08-07: qty 4

## 2020-08-07 MED ORDER — TRIAMCINOLONE ACETONIDE 0.025 % EX CREA: 1.0000 "application " | TOPICAL_CREAM | Freq: Two times a day (BID) | CUTANEOUS | Status: DC | PRN

## 2020-08-07 MED ORDER — FLUTICASONE PROPIONATE 50 MCG/ACT NA SUSP
2.0000 | Freq: Every day | NASAL | Status: DC
  Administered 2020-08-09 – 2020-08-13 (×5): 2 via NASAL
  Filled 2020-08-07: qty 16

## 2020-08-07 NOTE — ED Triage Notes (Signed)
Family reports slurred speech x 1 week.  Pt has had 2 falls in the last week.  Family thought it may be related to Gabapentin she started in June.  C/o R forehead pain from fall-hitting head.  No blood thinners. No arm drift.

## 2020-08-07 NOTE — ED Provider Notes (Signed)
[facility] EMERGENCY DEPARTMENT Provider Note   CSN: [id number] Arrival date & time: 08/07/20  1216     History No chief complaint on file.   Maria Neal is a 70 y.o. female.  HPI  70 year old female with history of CAD, AICD in place, COPD, cardiomyopathy, depression, hypertension, recent COVID-19 infection who presents emergency department with shortness of breath and leg swelling.  Family state that she has had some confusion over the past week with some slurred speech.  She has had 2 falls in the past week.  Family thought it could be related to her gabapentin.  The patient fell earlier today and hit her right forehead.  She is not on anticoagulation.  She arrived to [facility] emergency department alert and oriented x3, GCS 14, complaining of right forehead pain from a fall hitting her head.  Her fall today was from the couch where she fell onto her forehead.  No syncope.  Facial asymmetry or focal weakness.  Past Medical History:  Diagnosis Date   AICD (automatic cardioverter/defibrillator) present    Anxiety    CAD (coronary artery disease)    Cardiomyopathy ([place])    CHF (congestive heart failure) (HCC)    COPD (chronic obstructive pulmonary disease) ([place])    Depression    Dyspnea    Headache    History of kidney stones    History of shingles    Hypertension    Insomnia    On home oxygen therapy    bedtime and prn   Restless leg syndrome     Patient Active Problem List   Diagnosis Date Noted   Acute systolic heart failure ([place]) 08/07/2020   Medication management 06/25/2020   Chronic kidney disease, stage 3a ([place]) 09/04/2019   IFG (impaired fasting glucose) 04/01/2019   Leg pain 07/23/2018   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/23/2018   Chronic venous insufficiency 07/23/2018   Simple endometrial hyperplasia without atypia 07/04/2018   Epigastric pain 06/28/2018   Acute appendicitis 02/17/2018   Cervical radiculitis  05/17/2017   Hypercholesteremia 05/02/2017   Obesity (BMI 30-39.9) 05/02/2017   Mitral valve insufficiency 04/16/2017   Closed fracture of lateral malleolus 46/56/8127   Chronic systolic congestive heart failure, NYHA class 3 ([place]) 08/07/2016   NICM (nonischemic cardiomyopathy) ([place]) 08/07/2016   Bundle branch block, 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 Idelle Leech   CARDIAC CATHETERIZATION     CARDIAC DEFIBRILLATOR PLACEMENT     CHOLECYSTECTOMY N/A 08/23/2016   Procedure: LAPAROSCOPIC CHOLECYSTECTOMY;  Surgeon: Clayburn Pert, MD;  Location: [facility] ORS;  Service: General;  Laterality: N/A;   DILATATION & CURETTAGE/HYSTEROSCOPY WITH MYOSURE N/A 06/27/2018   Procedure: DILATATION & CURETTAGE/HYSTEROSCOPY WITH MYOSURE;  Surgeon: Malachy Mood, MD;  Location: [facility] ORS;  Service: Gynecology;  Laterality: N/A;   HERNIA REPAIR     LAPAROSCOPIC APPENDECTOMY N/A 02/17/2018   Procedure: APPENDECTOMY LAPAROSCOPIC;  Surgeon: Benjamine Sprague, DO;  Location: [facility] ORS;  Service: General;  Laterality: N/A;     OB History   No obstetric history on file.     Family History  Problem Relation Age of Onset   Diabetes Mellitus II Mother    Hypertension Mother  Heart attack Father    Heart disease Brother    Arthritis Sister    Depression Daughter    Depression Son    Schizophrenia Son    Arthritis Sister    Diabetes Brother     Social History   Tobacco Use   Smoking status: Former    Packs/day: 0.25    Types: Cigarettes   Smokeless tobacco: Never   Tobacco comments:    quit at age 52, whole pack lasted 3 weeks   Vaping Use   Vaping Use: Never used  Substance Use Topics   Alcohol  use: No    Alcohol/week: 0.0 standard drinks   Drug use: No    Home Medications Prior to Admission medications   Medication Sig Start Date End Date Taking? Authorizing Provider  albuterol (PROVENTIL HFA;VENTOLIN HFA) 108 (90 Base) MCG/ACT inhaler Inhale 2 puffs into the lungs every 6 (six) hours as needed for wheezing or shortness of breath.   Yes [provider]  aspirin EC 81 MG tablet Take 81 mg by mouth daily.   Yes [provider]  benzonatate (TESSALON) 200 MG capsule Take 1 capsule (200 mg total) by mouth 2 (two) times daily as needed for cough. 07/19/20  Yes Jon Billings, NP  carvedilol (COREG) 25 MG tablet Take 25 mg by mouth 2 (two) times daily. 04/12/17  Yes [provider]  cyclobenzaprine (FLEXERIL) 5 MG tablet TAKE 1 TABLET BY MOUTH 3 TIMES DAILY AS NEEDED Patient taking differently: Take 5 mg by mouth 3 (three) times daily as needed for muscle spasms. 04/04/19  Yes Lucilla Lame, MD  diclofenac Sodium (VOLTAREN) 1 % GEL Apply 2 g topically 2 (two) times daily as needed (pain). 01/21/20  Yes [provider]  fluticasone (FLONASE) 50 MCG/ACT nasal spray Place 2 sprays into both nostrils daily. 07/26/18  Yes Volney American, PA-C  Fluticasone-Umeclidin-Vilant (TRELEGY ELLIPTA) 100-62.5-25 MCG/INH AEPB Inhale 1 puff into the lungs daily. 02/12/20  Yes [provider]  furosemide (LASIX) 40 MG tablet Take 1 tablet (40 mg total) by mouth daily. 03/10/20  Yes Jon Billings, NP  gabapentin (NEURONTIN) 300 MG capsule TAKE 1 CAPSULE BY MOUTH 3 TIMES DAILY Patient taking differently: Take 300 mg by mouth 3 (three) times daily. 06/02/20  Yes Jon Billings, NP  loratadine (CLARITIN) 10 MG tablet Take 1 tablet (10 mg total) by mouth daily. 04/03/19  Yes Kris Hartmann, NP  losartan (COZAAR) 50 MG tablet Take 50 mg by mouth daily. 04/10/17  Yes [provider]  nystatin cream (MYCOSTATIN) Apply 1 application topically 2 (two) times  daily. Patient taking differently: Apply 1 application topically 2 (two) times daily as needed for dry skin. 05/20/20  Yes Jon Billings, NP  omeprazole (PRILOSEC) 20 MG capsule Take 1 capsule (20 mg total) by mouth daily. 05/03/20  Yes Jon Billings, NP  OXYGEN Inhale 3 L into the lungs 3 (three) times daily as needed (shortness of breath or coughing).   Yes [provider]  spironolactone (ALDACTONE) 25 MG tablet Take 12.5 mg by mouth daily. 04/09/17  Yes [provider]  triamcinolone (KENALOG) 0.025 % cream Apply 1 application topically 2 (two) times daily. Patient taking differently: Apply 1 application topically 2 (two) times daily as needed (rash). 03/10/20  Yes Jon Billings, NP    Allergies    Levaquin [levofloxacin]; Amoxicillin; Nitrofuran derivatives; Penicillins; Zithromax [azithromycin]; and Antihistamines, chlorpheniramine-type  Review of Systems   Review of Systems  Constitutional:  Negative for  chills and fever.  HENT:  Negative for ear pain and sore throat.   Eyes:  Negative for pain and visual disturbance.  Respiratory:  Negative for cough and shortness of breath.   Cardiovascular:  Negative for chest pain and palpitations.  Gastrointestinal:  Negative for abdominal pain and vomiting.  Genitourinary:  Negative for dysuria and hematuria.  Musculoskeletal:  Negative for arthralgias and back pain.  Skin:  Negative for color change and rash.  Neurological:  Positive for speech difficulty and headaches. Negative for seizures, syncope, facial asymmetry and weakness.  All other systems reviewed and are negative.  Physical Exam Updated Vital Signs BP (!) 97/57 (BP Location: Left Arm)   Pulse 94   Temp (!) 97.5 F (36.4 C) (Oral)   Resp 18   SpO2 93%   Physical Exam Vitals and nursing note reviewed.  Constitutional:      General: She is not in acute distress.    Appearance: She is well-developed.     Comments: GCS 14, ABC intact  HENT:      Head: Normocephalic and atraumatic.  Eyes:     Extraocular Movements: Extraocular movements intact.     Conjunctiva/sclera: Conjunctivae normal.     Pupils: Pupils are equal, round, and reactive to light.  Neck:     Comments: No midline tenderness to palpation of the cervical spine. Cardiovascular:     Rate and Rhythm: Normal rate and regular rhythm.     Heart sounds: No murmur heard. Pulmonary:     Effort: Pulmonary effort is normal. Tachypnea present. No respiratory distress.     Breath sounds: Examination of the right-lower field reveals rales. Examination of the left-lower field reveals rales. Rales present.  Chest:     Comments: Clavicles stable nontender to AP compression.  Chest wall stable and nontender to AP and lateral compression. Abdominal:     Palpations: Abdomen is soft.     Tenderness: There is no abdominal tenderness.  Musculoskeletal:        General: Swelling present.     Cervical back: Neck supple.     Right lower leg: Edema present.     Left lower leg: Edema present.     Comments: No midline tenderness to palpation of the thoracic or lumbar spine.  Extremities atraumatic with intact range of motion.  Mild tenderness to palpation of the anterior left tibia.  Skin:    General: Skin is warm and dry.  Neurological:     Mental Status: She is alert.     Comments: Cranial nerves II through XII grossly intact.  Moving all 4 extremities spontaneously.  Sensation grossly intact all 4 extremities    ED Results / Procedures / Treatments   Labs (all labs ordered are listed, but only abnormal results are displayed) Labs Reviewed  RESP PANEL BY RT-PCR (FLU A&B, COVID) ARPGX2 - Abnormal; Notable for the following components:      Result Value   SARS Coronavirus 2 by RT PCR POSITIVE (*)    All other components within normal limits  PROTIME-INR - Abnormal; Notable for the following components:   Prothrombin Time 15.4 (*)    All other components within normal limits  CBC -  Abnormal; Notable for the following components:   Hemoglobin 11.6 (*)    RDW 16.9 (*)    All other components within normal limits  COMPREHENSIVE METABOLIC PANEL - Abnormal; Notable for the following components:   Glucose, Bld 112 (*)    BUN 63 (*)  Creatinine, Ser 2.50 (*)    Albumin 3.3 (*)    Total Bilirubin 1.4 (*)    GFR, Estimated 20 (*)    All other components within normal limits  BRAIN NATRIURETIC PEPTIDE - Abnormal; Notable for the following components:   B Natriuretic Peptide 1,647.8 (*)    All other components within normal limits  CBC - Abnormal; Notable for the following components:   Hemoglobin 11.8 (*)    RDW 16.7 (*)    Platelets 143 (*)    All other components within normal limits  CREATININE, SERUM - Abnormal; Notable for the following components:   Creatinine, Ser 2.55 (*)    GFR, Estimated 20 (*)    All other components within normal limits  BASIC METABOLIC PANEL - Abnormal; Notable for the following components:   Glucose, Bld 101 (*)    BUN 61 (*)    Creatinine, Ser 2.36 (*)    GFR, Estimated 22 (*)    All other components within normal limits  I-STAT CHEM 8, ED - Abnormal; Notable for the following components:   BUN 57 (*)    Creatinine, Ser 2.40 (*)    Glucose, Bld 113 (*)    Calcium, Ion 1.11 (*)    All other components within normal limits  CBG MONITORING, ED - Abnormal; Notable for the following components:   Glucose-Capillary 115 (*)    All other components within normal limits  TROPONIN I (HIGH SENSITIVITY) - Abnormal; Notable for the following components:   Troponin I (High Sensitivity) 61 (*)    All other components within normal limits  TROPONIN I (HIGH SENSITIVITY) - Abnormal; Notable for the following components:   Troponin I (High Sensitivity) 62 (*)    All other components within normal limits  APTT  DIFFERENTIAL  HIV ANTIBODY (ROUTINE TESTING W REFLEX)  BLOOD GAS, ARTERIAL    EKG EKG Interpretation  Date/Time:  Sunday August 08 2020 02:38:10 EDT Ventricular Rate:  90 PR Interval:    QRS Duration: 213 QT Interval:  508 QTC Calculation: 560 R Axis:   132 Text Interpretation: VENTRICULAR PACED RHYTHM Premature ventricular complexes in pattern of bigeminy AV dual-paced complexes 08/07/2020, No significant change was found Confirmed by Delora Fuel ([id number]) on 08/08/2020 6:32:07 AM  Radiology CT HEAD WO CONTRAST  Result Date: 08/07/2020 CLINICAL DATA:  Neuro deficit, acute, stroke suspected Slurred speech x2 weeks. Two recent falls with head injury. Right headache EXAM: CT HEAD WITHOUT CONTRAST TECHNIQUE: Contiguous axial images were obtained from the base of the skull through the vertex without intravenous contrast. COMPARISON:  None. FINDINGS: Brain: Normal anatomic configuration. Parenchymal volume loss is commensurate with the patient's age. Mild periventricular white matter changes are present likely reflecting the sequela of small vessel ischemia. No abnormal intra or extra-axial mass lesion or fluid collection. No abnormal mass effect or midline shift. No evidence of acute intracranial hemorrhage or infarct. Ventricular size is normal. Cerebellum unremarkable. Vascular: No asymmetric hyperdense vasculature at the skull base. Skull: Intact Sinuses/Orbits: There is moderate mucosal thickening within the right maxillary sinus. Mild thickening within the left maxillary sinus and left frontal sinus. No air-fluid levels. Remaining paranasal sinuses are clear. The orbits are unremarkable. Other: Mastoid air cells and middle ear cavities are clear. IMPRESSION: No acute intracranial injury.  No calvarial fracture. Mild senescent change. Mild bilateral paranasal sinus disease. Electronically Signed   By: Fidela Salisbury MD   On: 08/07/2020 13:17   DG Chest Port 1 View  Result Date:  08/07/2020 CLINICAL DATA:  Acute systolic heart failure EXAM: PORTABLE CHEST 1 VIEW COMPARISON:  03/14/2019 FINDINGS: Cardiomegaly. Left pacer/AICD remains  in place, unchanged. No overt edema. No confluent opacities or effusions. IMPRESSION: No acute cardiopulmonary disease.  Cardiomegaly. Electronically Signed   By: Rolm Baptise M.D.   On: 08/07/2020 18:04   DG Tibia/Fibula Left Port  Result Date: 08/07/2020 CLINICAL DATA:  Suspected stroke. Injury to LOWER leg with oxygen tank on 08/02/2020. Legs are red and swollen. EXAM: PORTABLE LEFT TIBIA AND FIBULA - 2 VIEW COMPARISON:  None. FINDINGS: No acute fracture or subluxation. Atherosclerotic calcification present. Diffuse soft tissue edema. Note is made of small plantar calcaneal spur. IMPRESSION: Diffuse soft tissue swelling. No evidence for acute osseous abnormality. Electronically Signed   By: Nolon Nations M.D.   On: 08/07/2020 16:39    Procedures Procedures   Medications Ordered in ED Medications  sodium chloride flush (NS) 0.9 % injection 3 mL (3 mLs Intravenous Not Given 08/07/20 1331)  0.9 %  sodium chloride infusion (250 mLs Intravenous Not Given 08/07/20 1559)  heparin injection 5,000 Units (5,000 Units Subcutaneous Given 08/08/20 0821)  sodium chloride flush (NS) 0.9 % injection 3 mL (3 mLs Intravenous Given 08/07/20 2141)  sodium chloride flush (NS) 0.9 % injection 3 mL (has no administration in time range)  0.9 %  sodium chloride infusion (has no administration in time range)  acetaminophen (TYLENOL) tablet 650 mg (has no administration in time range)  ondansetron (ZOFRAN) injection 4 mg (has no administration in time range)  benzonatate (TESSALON) capsule 200 mg (has no administration in time range)  diclofenac Sodium (VOLTAREN) 1 % topical gel 2 g (has no administration in time range)  fluticasone (FLONASE) 50 MCG/ACT nasal spray 2 spray (has no administration in time range)  loratadine (CLARITIN) tablet 10 mg (10 mg Oral Not Given 08/07/20 1722)  pantoprazole (PROTONIX) EC tablet 40 mg (has no administration in time range)  spironolactone (ALDACTONE) tablet 12.5 mg (12.5 mg Oral Not  Given 08/07/20 1723)  triamcinolone (KENALOG) 5.366 % cream 1 application (has no administration in time range)  gabapentin (NEURONTIN) capsule 100 mg (has no administration in time range)  albuterol (PROVENTIL) (2.5 MG/3ML) 0.083% nebulizer solution 2.5 mg (has no administration in time range)  furosemide (LASIX) injection 40 mg (40 mg Intravenous Given 08/07/20 1351)  albumin human 25 % solution 12.5 g (0 g Intravenous Stopped 08/07/20 2338)    ED Course  I have reviewed the triage vital signs and the nursing notes.  Pertinent labs & imaging results that were available during my care of the patient were reviewed by me and considered in my medical decision making (see chart for details).    MDM Rules/Calculators/A&P                         70 year old female with history of CAD, AICD in place, COPD, cardiomyopathy, depression, hypertension, recent COVID-19 infection who presents emergency department with shortness of breath and leg swelling.  Family state that she has had some confusion over the past week with some slurred speech.  She has had 2 falls in the past week.  Family thought it could be related to her gabapentin.  The patient fell earlier today and hit her right forehead.  She is not on anticoagulation.  She arrived to [facility] emergency department alert and oriented x3, GCS 14, complaining of right forehead pain from a fall hitting her head.  Her fall today  was from the couch where she fell onto her forehead.  No syncope.  Facial asymmetry or focal weakness.  On arrival, the patient was afebrile, hemodynamically stable, without neurologic deficit on physical exam.  Patient presents with intermittent episodes of confusion and slurred speech over the past week with multiple falls at home.  No evidence for acute stroke at this time with a reassuring neurologic exam.  The patient presents with worsening lower extremity edema in addition to shortness of breath.  She is on 3 L O2 via home nasal cannula  and has not had an increasing oxygen requirement.  Physical exam concerning for CHF exacerbation with rales in the lower lung fields, 3+ pitting edema in the legs, mild tachypnea present.  The patient has been taking her home p.o. Lasix 40 mg daily.  She was administered 40 mg IV Lasix in the emergency department.  She denies any chest pain.  Differential diagnosis includes CHF exacerbation, ACS, subacute CVA, pneumonia, toxic metabolic encephalopathy, UTI.  EKG revealed a ventricular paced rhythm.  Chest x-ray was concerning for cardiomegaly with no overt edema.  X-ray of the left tib-fib was negative for acute fracture or malalignment.  CT head was without acute intracranial injury or skull fracture.  Labs obtained to include a BNP elevated to 1648, troponin mildly elevated to 61/62.  COVID-19 PCR resulted positive.  Patient was found to have an AKI on CKD with a creatinine of 2.50, BUN of 63 up from a baseline of 1.4.  The patient's uremia could be contributing to her intermittent episodes of confusion.  Concern for possible cardiorenal syndrome with sequelae of volume overload identified on physical exam, AKI and concern for hypovolemia with an elevated BNP.  We will plan to continue diuresis.  Cardiology consulted for admission.   Final Clinical Impression(s) / ED Diagnoses Final diagnoses:  Other hypervolemia  Acute on chronic congestive heart failure, unspecified heart failure type ([place])  AKI (acute kidney injury) ([place])  COVID-19    Rx / DC Orders ED Discharge Orders     None        Regan Lemming, MD 08/08/20 1141

## 2020-08-07 NOTE — Consult Note (Signed)
Referring Physician: Armandina Gemma, MD/Smith, MD/Alexander Milan (Cardiology)  Maria Neal is an 70 y.o. female.                       Chief Complaint: Shortness of breath and leg edema.  HPI: 70 years old white female who is 4 weeks post COVID infection has shortness of breath, leg edema and elevated BNP. at 1647.8 pg. She has PMH of AICD, CAD, COPD, cardiomyopathy, depression, HTN, insomnia and restless leg syndrome.  Her EKG shows Ventricular paced rhythm in bigeminy pattern. Her CT head was negative for acute intracranial injury for suspected stroke.  Past Medical History:  Diagnosis Date   AICD (automatic cardioverter/defibrillator) present    Anxiety    CAD (coronary artery disease)    Cardiomyopathy (HCC)    CHF (congestive heart failure) (HCC)    COPD (chronic obstructive pulmonary disease) ([place])    Depression    Dyspnea    Headache    History of kidney stones    History of shingles    Hypertension    Insomnia    On home oxygen therapy    bedtime and prn   Restless leg syndrome       Past Surgical History:  Procedure Laterality Date   CARDIAC CATHETERIZATION  04/2014   Dr. Idelle Leech   CARDIAC CATHETERIZATION     CARDIAC DEFIBRILLATOR PLACEMENT     CHOLECYSTECTOMY N/A 08/23/2016   Procedure: LAPAROSCOPIC CHOLECYSTECTOMY;  Surgeon: Clayburn Pert, MD;  Location: [facility] ORS;  Service: General;  Laterality: N/A;   [place] N/A 06/27/2018   Procedure: DILATATION & CURETTAGE/HYSTEROSCOPY WITH MYOSURE;  Surgeon: Malachy Mood, MD;  Location: [facility] ORS;  Service: Gynecology;  Laterality: N/A;   HERNIA REPAIR     LAPAROSCOPIC APPENDECTOMY N/A 02/17/2018   Procedure: APPENDECTOMY LAPAROSCOPIC;  Surgeon: Benjamine Sprague, DO;  Location: [facility] ORS;  Service: General;  Laterality: N/A;    Family History  Problem Relation Age of Onset   Diabetes Mellitus II Mother    Hypertension Mother    Heart attack Father    Heart disease Brother     Arthritis Sister    Depression Daughter    Depression Son    Schizophrenia Son    Arthritis Sister    Diabetes Brother    Social History:  reports that she has quit smoking. Her smoking use included cigarettes. She smoked an average of .25 packs per day. She has never used smokeless tobacco. She reports that she does not drink alcohol and does not use drugs.  Allergies:  Allergies  Allergen Reactions   Levaquin [Levofloxacin] Anaphylaxis   Amoxicillin Hives    Did it involve swelling of the face/tongue/throat, SOB, or low BP? No Did it involve sudden or severe rash/hives, skin peeling, or any reaction on the inside of your mouth or nose? No Did you need to seek medical attention at a hospital or doctor's office? No When did it last happen?      10+ years If all above answers are "NO", may proceed with cephalosporin use.   Nitrofuran Derivatives Other (See Comments)    Unknown   Penicillins Other (See Comments)    Thinks it made her itch a lot, but isn't sure. Did it involve swelling of the face/tongue/throat, SOB, or low BP? No Did it involve sudden or severe rash/hives, skin peeling, or any reaction on the inside of your mouth or nose? No Did you need to seek medical attention  at a hospital or doctor's office? No When did it last happen?      10+ years If all above answers are "NO", may proceed with cephalosporin use.    Zithromax [Azithromycin] Other (See Comments)   Antihistamines, Chlorpheniramine-Type Rash    (Not in a hospital admission)   Results for orders placed or performed during the hospital encounter of 08/07/20 (from the past 48 hour(s))  Protime-INR     Status: Abnormal   Collection Time: 08/07/20 12:41 PM  Result Value Ref Range   Prothrombin Time 15.4 (H) 11.4 - 15.2 seconds   INR 1.2 0.8 - 1.2    Comment: (NOTE) INR goal varies based on device and disease states. Performed at [facility], [place] [address], [place], [place] [address]   APTT      Status: None   Collection Time: 08/07/20 12:41 PM  Result Value Ref Range   aPTT 33 24 - 36 seconds    Comment: Performed at [facility] [address] [place], [place] [address]  CBC     Status: Abnormal   Collection Time: 08/07/20 12:41 PM  Result Value Ref Range   WBC 4.5 4.0 - 10.5 K/uL   RBC 4.21 3.87 - 5.11 MIL/uL   Hemoglobin 11.6 (L) 12.0 - 15.0 g/dL   HCT 38.4 36.0 - 46.0 %   MCV 91.2 80.0 - 100.0 fL   MCH 27.6 26.0 - 34.0 pg   MCHC 30.2 30.0 - 36.0 g/dL   RDW 16.9 (H) 11.5 - 15.5 %   Platelets 151 150 - 400 K/uL   nRBC 0.0 0.0 - 0.2 %    Comment: Performed at [facility], [place] [address] [place], [place] [address]  Differential     Status: None   Collection Time: 08/07/20 12:41 PM  Result Value Ref Range   Neutrophils Relative % 58 %   Neutro Abs 2.6 1.7 - 7.7 K/uL   Lymphocytes Relative 24 %   Lymphs Abs 1.1 0.7 - 4.0 K/uL   Monocytes Relative 14 %   Monocytes Absolute 0.6 0.1 - 1.0 K/uL   Eosinophils Relative 3 %   Eosinophils Absolute 0.1 0.0 - 0.5 K/uL   Basophils Relative 1 %   Basophils Absolute 0.0 0.0 - 0.1 K/uL   Immature Granulocytes 0 %   Abs Immature Granulocytes 0.01 0.00 - 0.07 K/uL    Comment: Performed at [facility] [address] [place], [place] [address]  Comprehensive metabolic panel     Status: Abnormal   Collection Time: 08/07/20 12:41 PM  Result Value Ref Range   Sodium 136 135 - 145 mmol/L   Potassium 5.1 3.5 - 5.1 mmol/L   Chloride 99 98 - 111 mmol/L   CO2 27 22 - 32 mmol/L   Glucose, Bld 112 (H) 70 - 99 mg/dL    Comment: Glucose reference range applies only to samples taken after fasting for at least 8 hours.   BUN 63 (H) 8 - 23 mg/dL   Creatinine, Ser 2.50 (H) 0.44 - 1.00 mg/dL   Calcium 9.2 8.9 - 10.3 mg/dL   Total Protein 6.7 6.5 - 8.1 g/dL   Albumin 3.3 (L) 3.5 - 5.0 g/dL   AST 16 15 - 41 U/L   ALT 10 0 - 44 U/L   Alkaline Phosphatase 57 38 - 126 U/L   Total Bilirubin 1.4 (H) 0.3 - 1.2 mg/dL   GFR,  Estimated 20 (L) >60  mL/min    Comment: (NOTE) Calculated using the CKD-EPI Creatinine Equation (2021)    Anion gap 10 5 - 15    Comment: Performed at [facility], [place] [address], [place], [place] [address]  CBG monitoring, ED     Status: Abnormal   Collection Time: 08/07/20 12:43 PM  Result Value Ref Range   Glucose-Capillary 115 (H) 70 - 99 mg/dL    Comment: Glucose reference range applies only to samples taken after fasting for at least 8 hours.   Comment 1 Notify RN   I-stat chem 8, ED     Status: Abnormal   Collection Time: 08/07/20  1:02 PM  Result Value Ref Range   Sodium 136 135 - 145 mmol/L   Potassium 5.0 3.5 - 5.1 mmol/L   Chloride 99 98 - 111 mmol/L   BUN 57 (H) 8 - 23 mg/dL   Creatinine, Ser 2.40 (H) 0.44 - 1.00 mg/dL   Glucose, Bld 113 (H) 70 - 99 mg/dL    Comment: Glucose reference range applies only to samples taken after fasting for at least 8 hours.   Calcium, Ion 1.11 (L) 1.15 - 1.40 mmol/L   TCO2 29 22 - 32 mmol/L   Hemoglobin 13.3 12.0 - 15.0 g/dL   HCT 39.0 36.0 - 46.0 %  Resp Panel by RT-PCR (Flu A&B, Covid) Nasopharyngeal Swab     Status: Abnormal   Collection Time: 08/07/20  1:36 PM   Specimen: Nasopharyngeal Swab; Nasopharyngeal(NP) swabs in vial transport medium  Result Value Ref Range   SARS Coronavirus 2 by RT PCR POSITIVE (A) NEGATIVE    Comment: RESULT CALLED TO, READ BACK BY AND VERIFIED WITH: RN KATRINA Y. [id number] FCP (NOTE) SARS-CoV-2 target nucleic acids are DETECTED.  The SARS-CoV-2 RNA is generally detectable in upper respiratory specimens during the acute phase of infection. Positive results are indicative of the presence of the identified virus, but do not rule out bacterial infection or co-infection with other pathogens not detected by the test. Clinical correlation with patient history and other diagnostic information is necessary to determine patient infection status. The expected result is Negative.  Fact Sheet for  Patients: EntrepreneurPulse.com.au  Fact Sheet for Healthcare Providers: IncredibleEmployment.be  This test is not yet approved or cleared by the [place] FDA and  has been authorized for detection and/or diagnosis of SARS-CoV-2 by FDA under an Emergency Use Authorization (EUA).  This EUA will remain in effect (meaning this test can be use d) for the duration of  the COVID-19 declaration under Section 564(b)(1) of the Act, 21 U.S.C. section 360bbb-3(b)(1), unless the authorization is terminated or revoked sooner.     Influenza A by PCR NEGATIVE NEGATIVE   Influenza B by PCR NEGATIVE NEGATIVE    Comment: (NOTE) The Xpert Xpress SARS-CoV-2/FLU/RSV plus assay is intended as an aid in the diagnosis of influenza from Nasopharyngeal swab specimens and should not be used as a sole basis for treatment. Nasal washings and aspirates are unacceptable for Xpert Xpress SARS-CoV-2/FLU/RSV testing.  Fact Sheet for Patients: EntrepreneurPulse.com.au  Fact Sheet for Healthcare Providers: IncredibleEmployment.be  This test is not yet approved or cleared by the [place] FDA and has been authorized for detection and/or diagnosis of SARS-CoV-2 by FDA under an Emergency Use Authorization (EUA). This EUA will remain in effect (meaning this test can be used) for the duration of the COVID-19 declaration under Section 564(b)(1) of the Act, 21 U.S.C. section 360bbb-3(b)(1), unless the authorization is terminated  or revoked.  Performed at [facility], [place] [address], [place], [place] [address]   Brain natriuretic peptide     Status: Abnormal   Collection Time: 08/07/20  1:44 PM  Result Value Ref Range   B Natriuretic Peptide 1,647.8 (H) 0.0 - 100.0 pg/mL    Comment: Performed at [facility] [address], [place], [place] [address]  Troponin I (High Sensitivity)     Status: Abnormal   Collection Time:  08/07/20  1:44 PM  Result Value Ref Range   Troponin I (High Sensitivity) 61 (H) <18 ng/L    Comment: (NOTE) Elevated high sensitivity troponin I (hsTnI) values and significant  changes across serial measurements may suggest ACS but many other  chronic and acute conditions are known to elevate hsTnI results.  Refer to the "Links" section for chest pain algorithms and additional  guidance. Performed at [facility], [place] [address] [place], [place] [address]    CT HEAD WO CONTRAST  Result Date: 08/07/2020 CLINICAL DATA:  Neuro deficit, acute, stroke suspected Slurred speech x2 weeks. Two recent falls with head injury. Right headache EXAM: CT HEAD WITHOUT CONTRAST TECHNIQUE: Contiguous axial images were obtained from the base of the skull through the vertex without intravenous contrast. COMPARISON:  None. FINDINGS: Brain: Normal anatomic configuration. Parenchymal volume loss is commensurate with the patient's age. Mild periventricular white matter changes are present likely reflecting the sequela of small vessel ischemia. No abnormal intra or extra-axial mass lesion or fluid collection. No abnormal mass effect or midline shift. No evidence of acute intracranial hemorrhage or infarct. Ventricular size is normal. Cerebellum unremarkable. Vascular: No asymmetric hyperdense vasculature at the skull base. Skull: Intact Sinuses/Orbits: There is moderate mucosal thickening within the right maxillary sinus. Mild thickening within the left maxillary sinus and left frontal sinus. No air-fluid levels. Remaining paranasal sinuses are clear. The orbits are unremarkable. Other: Mastoid air cells and middle ear cavities are clear. IMPRESSION: No acute intracranial injury.  No calvarial fracture. Mild senescent change. Mild bilateral paranasal sinus disease. Electronically Signed   By: Fidela Salisbury MD   On: 08/07/2020 13:17   DG Tibia/Fibula Left Port  Result Date: 08/07/2020 CLINICAL DATA:  Suspected  stroke. Injury to LOWER leg with oxygen tank on 08/02/2020. Legs are red and swollen. EXAM: PORTABLE LEFT TIBIA AND FIBULA - 2 VIEW COMPARISON:  None. FINDINGS: No acute fracture or subluxation. Atherosclerotic calcification present. Diffuse soft tissue edema. Note is made of small plantar calcaneal spur. IMPRESSION: Diffuse soft tissue swelling. No evidence for acute osseous abnormality. Electronically Signed   By: Nolon Nations M.D.   On: 08/07/2020 16:39    Review Of Systems Constitutional: No fever, chills, weight loss or gain. Eyes: No vision change, wears glasses. No discharge or pain. Ears: No hearing loss, No tinnitus. Respiratory: No asthma, COPD, pneumonias. Positive shortness of breath. No hemoptysis. Cardiovascular: No chest pain, palpitation, positive leg edema. Gastrointestinal: No nausea, vomiting, diarrhea, constipation. No GI bleed. No hepatitis. Genitourinary: No dysuria, hematuria, kidney stone. No incontinance. Neurological: No headache, stroke, seizures.  Psychiatry: No psych facility admission for anxiety, depression, suicide. No detox. Skin: No rash. Musculoskeletal: Positive joint pain, fibromyalgia. No neck pain, back pain. Lymphadenopathy: No lymphadenopathy. Hematology: No anemia or easy bruising.   Blood pressure (!) 112/54, pulse 81, temperature 98.1 F (36.7 C), temperature source Oral, resp. rate 20, SpO2 100 %. There is no height or weight on file to calculate BMI. General appearance: alert,  cooperative, appears stated age and moderate respiratory distress Head: Normocephalic, atraumatic. Eyes: Hazel eyes, pink conjunctiva, corneas clear. PERRL, EOM's intact. Neck: No adenopathy, no carotid bruit, no JVD, supple, symmetrical, trachea midline and thyroid not enlarged. Resp: Clear to auscultation bilaterally. Cardio: Regular rate and rhythm, S1, S2 normal, II/VI systolic murmur, no click, rub or gallop GI: Soft, non-tender; bowel sounds normal; no  organomegaly. Extremities: 2 + edema up to knees, no cyanosis or clubbing. Skin: Warm and dry.  Neurologic: Alert and oriented X 1, normal strength.  Assessment/Plan Acute on chronic left systolic heart failure Dilated cardiomyopathy CAD HTN COPD S/P COVID infection Acute renal failure  Admit IV lasix Echocardiogram. R/O MI Chest x-ray.  Time spent: Review of old records, Lab, x-rays, EKG, other cardiac tests, examination, discussion with patient/Nurse/Family/referring doctor over 70 minutes.  Birdie Riddle, MD  08/07/2020, 5:03 PM

## 2020-08-07 NOTE — ED Notes (Signed)
MD notified of pt bp. Rechecking bp at this time. Will continue to monitor. Pt is alert and oriented x 4.

## 2020-08-08 ENCOUNTER — Encounter (HOSPITAL_COMMUNITY): Payer: Medicare Other

## 2020-08-08 ENCOUNTER — Inpatient Hospital Stay (HOSPITAL_COMMUNITY): Payer: Medicare Other

## 2020-08-08 ENCOUNTER — Encounter (HOSPITAL_COMMUNITY): Payer: Self-pay | Admitting: Cardiovascular Disease

## 2020-08-08 DIAGNOSIS — R609 Edema, unspecified: Secondary | ICD-10-CM

## 2020-08-08 LAB — ECHOCARDIOGRAM COMPLETE
AR max vel: 1.18 cm2
AV Area VTI: 1.26 cm2
AV Area mean vel: 1.12 cm2
AV Mean grad: 3 mmHg
AV Peak grad: 6.4 mmHg
Ao pk vel: 1.26 m/s
Height: 63 in
MV M vel: 4.74 m/s
MV Peak grad: 89.9 mmHg
P 1/2 time: 472 msec
Radius: 0.9 cm
S' Lateral: 6.4 cm

## 2020-08-08 LAB — C-REACTIVE PROTEIN: CRP: 0.8 mg/dL (ref <1.0)

## 2020-08-08 LAB — BASIC METABOLIC PANEL
Anion gap: 10 (ref 5–15)
BUN: 61 mg/dL — ABNORMAL HIGH (ref 8–23)
CO2: 28 mmol/L (ref 22–32)
Calcium: 9 mg/dL (ref 8.9–10.3)
Chloride: 100 mmol/L (ref 98–111)
Creatinine, Ser: 2.36 mg/dL — ABNORMAL HIGH (ref 0.44–1.00)
GFR, Estimated: 22 mL/min — ABNORMAL LOW (ref >60)
Glucose, Bld: 101 mg/dL — ABNORMAL HIGH (ref 70–99)
Potassium: 4.7 mmol/L (ref 3.5–5.1)
Sodium: 138 mmol/L (ref 135–145)

## 2020-08-08 LAB — BLOOD GAS, ARTERIAL
Acid-Base Excess: 2.2 mmol/L — ABNORMAL HIGH (ref 0.0–2.0)
Bicarbonate: 27 mmol/L (ref 20.0–28.0)
Drawn by: 24686
FIO2: 36
O2 Saturation: 96.1 %
Patient temperature: 37
pCO2 arterial: 47.9 mmHg (ref 32.0–48.0)
pH, Arterial: 7.369 (ref 7.350–7.450)
pO2, Arterial: 81.3 mmHg — ABNORMAL LOW (ref 83.0–108.0)

## 2020-08-08 LAB — PROCALCITONIN: Procalcitonin: <0.1 ng/mL

## 2020-08-08 LAB — D-DIMER, QUANTITATIVE: D-Dimer, Quant: 1.21 ug/mL-FEU — ABNORMAL HIGH (ref 0.00–0.50)

## 2020-08-08 MED ORDER — ALBUTEROL SULFATE (2.5 MG/3ML) 0.083% IN NEBU
2.5000 mg | INHALATION_SOLUTION | Freq: Four times a day (QID) | RESPIRATORY_TRACT | Status: DC
  Administered 2020-08-08: 2.5 mg via RESPIRATORY_TRACT
  Filled 2020-08-08 (×2): qty 3

## 2020-08-08 MED ORDER — VANCOMYCIN VARIABLE DOSE PER UNSTABLE RENAL FUNCTION (PHARMACIST DOSING): Status: DC

## 2020-08-08 MED ORDER — MAGNESIUM SULFATE IN D5W 1-5 GM/100ML-% IV SOLN
1.0000 g | Freq: Once | INTRAVENOUS | Status: AC
  Administered 2020-08-08: 1 g via INTRAVENOUS
  Filled 2020-08-08: qty 100

## 2020-08-08 MED ORDER — HEPARIN BOLUS VIA INFUSION
2500.0000 [IU] | Freq: Once | INTRAVENOUS | Status: AC
  Administered 2020-08-08: 2500 [IU] via INTRAVENOUS
  Filled 2020-08-08: qty 2500

## 2020-08-08 MED ORDER — GABAPENTIN 100 MG PO CAPS
100.0000 mg | ORAL_CAPSULE | Freq: Three times a day (TID) | ORAL | Status: DC
  Administered 2020-08-08 – 2020-08-10 (×5): 100 mg via ORAL
  Filled 2020-08-08 (×5): qty 1

## 2020-08-08 MED ORDER — HEPARIN (PORCINE) 25000 UT/250ML-% IV SOLN
1350.0000 [IU]/h | INTRAVENOUS | Status: DC
  Administered 2020-08-08: 1450 [IU]/h via INTRAVENOUS
  Administered 2020-08-09: 1350 [IU]/h via INTRAVENOUS
  Filled 2020-08-08 (×2): qty 250

## 2020-08-08 MED ORDER — VANCOMYCIN HCL 1750 MG/350ML IV SOLN
1750.0000 mg | Freq: Once | INTRAVENOUS | Status: AC
  Administered 2020-08-08: 1750 mg via INTRAVENOUS
  Filled 2020-08-08: qty 350

## 2020-08-08 NOTE — ED Notes (Signed)
The pt attempts  To get out of the bed regularly

## 2020-08-08 NOTE — Progress Notes (Signed)
Patients daughter called and stated that her mother had an appointment this week in [place] to follow up on AICD/PPM not functioning properly and would like it checked while she is here.Jessie Foot, RN

## 2020-08-08 NOTE — Progress Notes (Signed)
ANTICOAGULATION CONSULT NOTE - Initial Consult  Pharmacy Consult for heparin Indication:  R/o pulmonary embolism  Allergies  Allergen Reactions   Levaquin [Levofloxacin] Anaphylaxis   Amoxicillin Hives    Did it involve swelling of the face/tongue/throat, SOB, or low BP? No Did it involve sudden or severe rash/hives, skin peeling, or any reaction on the inside of your mouth or nose? No Did you need to seek medical attention at a hospital or doctor's office? No When did it last happen?      10+ years If all above answers are "NO", may proceed with cephalosporin use.   Nitrofuran Derivatives Other (See Comments)    Unknown   Penicillins Other (See Comments)    Thinks it made her itch a lot, but isn't sure. Did it involve swelling of the face/tongue/throat, SOB, or low BP? No Did it involve sudden or severe rash/hives, skin peeling, or any reaction on the inside of your mouth or nose? No Did you need to seek medical attention at a hospital or doctor's office? No When did it last happen?      10+ years If all above answers are "NO", may proceed with cephalosporin use.    Zithromax [Azithromycin] Other (See Comments)   Antihistamines, Chlorpheniramine-Type Rash    Patient Measurements: Height: 5\' 3"  (160 cm) IBW/kg (Calculated) : 52.4 Heparin Dosing Weight: 85 kg   Vital Signs: Temp: 97.5 F (36.4 C) (08/07 1014) Temp Source: Oral (08/07 1207) BP: 97/57 (08/07 1014) Pulse Rate: 94 (08/07 1014)  Labs: Recent Labs    08/07/20 1241 08/07/20 1302 08/07/20 1344 08/07/20 1544 08/07/20 1735 08/08/20 0505  HGB 11.6* 13.3  --   --  11.8*  --   HCT 38.4 39.0  --   --  37.7  --   PLT 151  --   --   --  143*  --   APTT 33  --   --   --   --   --   LABPROT 15.4*  --   --   --   --   --   INR 1.2  --   --   --   --   --   CREATININE 2.50* 2.40*  --   --  2.55* 2.36*  TROPONINIHS  --   --  61* 62*  --   --     CrCl cannot be calculated (Unknown ideal weight.).   Medical  History: Past Medical History:  Diagnosis Date   AICD (automatic cardioverter/defibrillator) present    Anxiety    CAD (coronary artery disease)    Cardiomyopathy ([place])    CHF (congestive heart failure) (HCC)    COPD (chronic obstructive pulmonary disease) (HCC)    Depression    Dyspnea    Headache    History of kidney stones    History of shingles    Hypertension    Insomnia    On home oxygen therapy    bedtime and prn   Restless leg syndrome     Medications:  Medications Prior to Admission  Medication Sig Dispense Refill Last Dose   albuterol (PROVENTIL HFA;VENTOLIN HFA) 108 (90 Base) MCG/ACT inhaler Inhale 2 puffs into the lungs every 6 (six) hours as needed for wheezing or shortness of breath.   08/06/2020   aspirin EC 81 MG tablet Take 81 mg by mouth daily.   08/06/2020   benzonatate (TESSALON) 200 MG capsule Take 1 capsule (200 mg total) by mouth 2 (two) times daily  as needed for cough. 20 capsule 0 Past Week   carvedilol (COREG) 25 MG tablet Take 25 mg by mouth 2 (two) times daily.   08/06/2020 at 2000   cyclobenzaprine (FLEXERIL) 5 MG tablet TAKE 1 TABLET BY MOUTH 3 TIMES DAILY AS NEEDED (Patient taking differently: Take 5 mg by mouth 3 (three) times daily as needed for muscle spasms.) 30 tablet 0 unk   diclofenac Sodium (VOLTAREN) 1 % GEL Apply 2 g topically 2 (two) times daily as needed (pain).   unk   fluticasone (FLONASE) 50 MCG/ACT nasal spray Place 2 sprays into both nostrils daily. 16 g 6 08/06/2020   Fluticasone-Umeclidin-Vilant (TRELEGY ELLIPTA) 100-62.5-25 MCG/INH AEPB Inhale 1 puff into the lungs daily.   08/06/2020   furosemide (LASIX) 40 MG tablet Take 1 tablet (40 mg total) by mouth daily. 90 tablet 1 08/06/2020   gabapentin (NEURONTIN) 300 MG capsule TAKE 1 CAPSULE BY MOUTH 3 TIMES DAILY (Patient taking differently: Take 300 mg by mouth 3 (three) times daily.) 270 capsule 1 08/06/2020   loratadine (CLARITIN) 10 MG tablet Take 1 tablet (10 mg total) by mouth daily. 30 tablet  5 08/06/2020   losartan (COZAAR) 50 MG tablet Take 50 mg by mouth daily.   08/06/2020   nystatin cream (MYCOSTATIN) Apply 1 application topically 2 (two) times daily. (Patient taking differently: Apply 1 application topically 2 (two) times daily as needed for dry skin.) 30 g 0 unk   omeprazole (PRILOSEC) 20 MG capsule Take 1 capsule (20 mg total) by mouth daily. 30 capsule 3 08/06/2020   OXYGEN Inhale 3 L into the lungs 3 (three) times daily as needed (shortness of breath or coughing).      spironolactone (ALDACTONE) 25 MG tablet Take 12.5 mg by mouth daily.   08/06/2020   triamcinolone (KENALOG) 0.025 % cream Apply 1 application topically 2 (two) times daily. (Patient taking differently: Apply 1 application topically 2 (two) times daily as needed (rash).) 30 g 0 unk    Assessment: 44 YOF who is hypotensive and borderline oxygen saturation now with elevated D-dimer concerning for pulmonary embolism. VQ scan ordered. In the meantime, pharmacy has been consulted to start IV heparin empirically to cover for r/o PE.   Hgb mildly low. Plt low. SCr elevated  Of note patient received a dose of SQ heparin 5000 units at 1356 today.   Goal of Therapy:  Heparin level 0.3-0.7 units/ml Monitor platelets by anticoagulation protocol: Yes   Plan:  -Give half bolus of heparin 2500 units and start heparin infusion at 1450 units/hr -D/c SQ heparin -F/u 8 hr HL -Monitor daily HL, CBC and s/s of bleeding   Albertina Parr, PharmD., BCPS, BCCCP Clinical Pharmacist Please refer to [facility] for unit-specific pharmacist

## 2020-08-08 NOTE — Progress Notes (Signed)
  Echocardiogram 2D Echocardiogram has been performed.  Maria Neal 08/08/2020, 5:18 PM

## 2020-08-08 NOTE — Progress Notes (Signed)
BLE venous duplex has been completed.  Results can be found under chart review under CV PROC. 08/08/2020 2:52 PM Jantzen Pilger RVT, RDMS

## 2020-08-08 NOTE — Progress Notes (Signed)
Ref: Jon Billings, NP   Subjective:  Awake. Confused. Some what hypotensive and borderline oxygen saturation with supplemental oxygen by [place]. Chest x-ray is without pulmonary edema.  Objective:  Vital Signs in the last 24 hours: Temp:  [97.5 F (36.4 C)-98.2 F (36.8 C)] 97.5 F (36.4 C) (08/07 1014) Pulse Rate:  [41-114] 94 (08/07 1014) Resp:  [13-24] 18 (08/07 1014) BP: (82-126)/(50-104) 97/57 (08/07 1014) SpO2:  [90 %-100 %] 93 % (08/07 1014)  Physical Exam: BP Readings from Last 1 Encounters:  08/08/20 (!) 97/57     Wt Readings from Last 1 Encounters:  06/25/20 84.9 kg    Weight change:  There is no height or weight on file to calculate BMI. HEENT: /AT, Eyes-Hazel, Conjunctiva-Pink, Sclera-Non-icteric Neck: No JVD, No bruit, Trachea midline. Lungs:  Clearing, Bilateral. Cardiac:  Regular rhythm, normal S1 and S2, no S3. II/VI systolic murmur. Abdomen:  Soft, non-tender. BS present. Extremities:  Trace edema present. No cyanosis. No clubbing. CNS: AxOx1, Cranial nerves grossly intact, moves all 4 extremities.  Skin: Warm and dry.   Intake/Output from previous day: 08/06 0701 - 08/07 0700 In: 50 [IV Piggyback:50] Out: -     Lab Results: BMET    Component Value Date/Time   NA 138 08/08/2020 0505   NA 136 08/07/2020 1302   NA 136 08/07/2020 1241   NA 143 05/13/2020 1314   NA 144 05/03/2020 1334   NA 141 03/10/2020 1536   NA 139 12/07/2013 1207   NA 141 09/21/2013 1133   NA 137 04/22/2013 1032   K 4.7 08/08/2020 0505   K 5.0 08/07/2020 1302   K 5.1 08/07/2020 1241   K 3.8 12/07/2013 1207   K 4.1 09/21/2013 1133   K 3.7 04/22/2013 1032   CL 100 08/08/2020 0505   CL 99 08/07/2020 1302   CL 99 08/07/2020 1241   CL 99 12/07/2013 1207   CL 104 09/21/2013 1133   CL 102 04/22/2013 1032   CO2 28 08/08/2020 0505   CO2 27 08/07/2020 1241   CO2 23 05/13/2020 1314   CO2 31 12/07/2013 1207   CO2 32 09/21/2013 1133   CO2 31 04/22/2013 1032   GLUCOSE  101 (H) 08/08/2020 0505   GLUCOSE 113 (H) 08/07/2020 1302   GLUCOSE 112 (H) 08/07/2020 1241   GLUCOSE 97 12/07/2013 1207   GLUCOSE 97 09/21/2013 1133   GLUCOSE 143 (H) 04/22/2013 1032   BUN 61 (H) 08/08/2020 0505   BUN 57 (H) 08/07/2020 1302   BUN 63 (H) 08/07/2020 1241   BUN 27 05/13/2020 1314   BUN 35 (H) 05/03/2020 1334   BUN 29 (H) 03/10/2020 1536   BUN 8 12/07/2013 1207   BUN 15 09/21/2013 1133   BUN 15 04/22/2013 1032   CREATININE 2.36 (H) 08/08/2020 0505   CREATININE 2.55 (H) 08/07/2020 1735   CREATININE 2.40 (H) 08/07/2020 1302   CREATININE 0.77 12/07/2013 1207   CREATININE 0.79 09/21/2013 1133   CREATININE 0.76 04/22/2013 1032   CALCIUM 9.0 08/08/2020 0505   CALCIUM 9.2 08/07/2020 1241   CALCIUM 9.4 05/13/2020 1314   CALCIUM 8.7 12/07/2013 1207   CALCIUM 9.2 09/21/2013 1133   CALCIUM 8.9 04/22/2013 1032   GFRNONAA 22 (L) 08/08/2020 0505   GFRNONAA 20 (L) 08/07/2020 1735   GFRNONAA 20 (L) 08/07/2020 1241   GFRNONAA >60 12/07/2013 1207   GFRNONAA >60 09/21/2013 1133   GFRNONAA >60 04/22/2013 1032   GFRNONAA >60 02/23/2013 0506   GFRAA 41 (L)  09/04/2019 1114   GFRAA 58 (L) 03/28/2019 1336   GFRAA 55 (L) 03/19/2019 1509   GFRAA >60 12/07/2013 1207   GFRAA >60 09/21/2013 1133   GFRAA >60 04/22/2013 1032   GFRAA >60 02/23/2013 0506   CBC    Component Value Date/Time   WBC 4.5 08/07/2020 1735   RBC 4.15 08/07/2020 1735   HGB 11.8 (L) 08/07/2020 1735   HGB 13.6 09/05/2018 1401   HCT 37.7 08/07/2020 1735   HCT 40.4 09/05/2018 1401   PLT 143 (L) 08/07/2020 1735   PLT 178 09/05/2018 1401   MCV 90.8 08/07/2020 1735   MCV 88 09/05/2018 1401   MCV 91 12/07/2013 1207   MCH 28.4 08/07/2020 1735   MCHC 31.3 08/07/2020 1735   RDW 16.7 (H) 08/07/2020 1735   RDW 15.6 (H) 09/05/2018 1401   RDW 14.6 (H) 12/07/2013 1207   LYMPHSABS 1.1 08/07/2020 1241   LYMPHSABS 2.7 09/05/2018 1401   LYMPHSABS 1.0 02/23/2013 0506   MONOABS 0.6 08/07/2020 1241   MONOABS 0.1 (L)  02/23/2013 0506   EOSABS 0.1 08/07/2020 1241   EOSABS 0.0 02/23/2013 0506   BASOSABS 0.0 08/07/2020 1241   BASOSABS 0.0 02/23/2013 0506   HEPATIC Function Panel Recent Labs    05/03/20 1334 05/13/20 1314 08/07/20 1241  PROT 7.2 6.9 6.7   HEMOGLOBIN A1C No components found for: HGA1C,  MPG CARDIAC ENZYMES Lab Results  Component Value Date   TROPONINI <0.03 09/02/2014   TROPONINI <0.03 09/02/2014   TROPONINI <0.03 05/07/2014   BNP No results for input(s): PROBNP in the last 8760 hours. TSH No results for input(s): TSH in the last 8760 hours. CHOLESTEROL Recent Labs    03/10/20 1536  CHOL 202*    Scheduled Meds:  albuterol  2.5 mg Nebulization QID   fluticasone  2 spray Each Nare Daily   gabapentin  100 mg Oral TID   heparin  5,000 Units Subcutaneous Q8H   loratadine  10 mg Oral Daily   pantoprazole  40 mg Oral Daily   sodium chloride flush  3 mL Intravenous Once   sodium chloride flush  3 mL Intravenous Q12H   spironolactone  12.5 mg Oral Daily   Continuous Infusions:  sodium chloride     sodium chloride     PRN Meds:.sodium chloride, acetaminophen, benzonatate, diclofenac Sodium, ondansetron (ZOFRAN) IV, sodium chloride flush, triamcinolone  Assessment/Plan:  Acute on chronic systolic left heart failure Dilated cardiomyopathy S/P ICD CAD HTN COPD S/P COVID infection Acute renal failure Dehydration  Hold furosemide and other BP medications. ABGs. Supplemental oxygen.   LOS: 1 day   Time spent including chart review, lab review, examination, discussion with patient/Nurse : 30 min   Dixie Dials  MD  08/08/2020, 10:53 AM

## 2020-08-08 NOTE — Progress Notes (Signed)
Pharmacy Antibiotic Note  Maria Neal is a 70 y.o. female admitted on 08/07/2020 with shortness of breath and lower extremity edema, suspicious for cellulitis vs DVT vs CHF exacerbation. Pt with elevated D-dimer and negative ultrasound for DVT.  Pharmacy has been consulted for vancomycin dosing.  Pt with WBC within normal limits (4.5), pro-cal and CRP WNL. Pt remains afebrile. Pt looks to be in AKI with SCr at 2.36, down from admit of 2.55 (Baseline looks to be around ~1.1 results from 05/2020). Will utilize variable kidney function vancomycin protocol to dose.   Plan: Vancomycin IV 1750 mg x 1 Monitor renal function and clinical symptoms F/u antibiotic plan   Height: 5\' 3"  (160 cm) IBW/kg (Calculated) : 52.4  Temp (24hrs), Avg:97.9 F (36.6 C), Min:97.5 F (36.4 C), Max:98.2 F (36.8 C)  Recent Labs  Lab 08/07/20 1241 08/07/20 1302 08/07/20 1735 08/08/20 0505  WBC 4.5  --  4.5  --   CREATININE 2.50* 2.40* 2.55* 2.36*    CrCl cannot be calculated (Unknown ideal weight.).    Allergies  Allergen Reactions   Levaquin [Levofloxacin] Anaphylaxis   Amoxicillin Hives    Did it involve swelling of the face/tongue/throat, SOB, or low BP? No Did it involve sudden or severe rash/hives, skin peeling, or any reaction on the inside of your mouth or nose? No Did you need to seek medical attention at a hospital or doctor's office? No When did it last happen?      10+ years If all above answers are "NO", may proceed with cephalosporin use.   Nitrofuran Derivatives Other (See Comments)    Unknown   Penicillins Other (See Comments)    Thinks it made her itch a lot, but isn't sure. Did it involve swelling of the face/tongue/throat, SOB, or low BP? No Did it involve sudden or severe rash/hives, skin peeling, or any reaction on the inside of your mouth or nose? No Did you need to seek medical attention at a hospital or doctor's office? No When did it last happen?      10+ years If all above  answers are "NO", may proceed with cephalosporin use.    Zithromax [Azithromycin] Other (See Comments)   Antihistamines, Chlorpheniramine-Type Rash    Antimicrobials this admission: Vancomycin 1750 mg x 1 >> 8/7  Dose adjustments this admission: None  Microbiology results: None  Thank you for involving pharmacy in this patient's care.  Elita Quick, PharmD PGY1 Ambulatory Care Pharmacy Resident 08/08/2020 3:43 PM  **Pharmacist phone directory can be found on [email or web address] listed under [place]**

## 2020-08-08 NOTE — H&P (Signed)
Referring Physician: Armandina Gemma, MD/Smith, MD/Alexander Dix (Cardiology)   Maria Neal is an 70 y.o. female.                       Chief Complaint: Shortness of breath and leg edema.   HPI: 70 years old white female who is 4 weeks post COVID infection has shortness of breath, leg edema and elevated BNP. at 1647.8 pg. She has PMH of AICD, CAD, COPD, cardiomyopathy, depression, HTN, insomnia and restless leg syndrome. Her EKG shows Ventricular paced rhythm in bigeminy pattern. Her CT head was negative for acute intracranial injury for suspected stroke.       Past Medical History:  Diagnosis Date   AICD (automatic cardioverter/defibrillator) present     Anxiety     CAD (coronary artery disease)     Cardiomyopathy (HCC)     CHF (congestive heart failure) (HCC)     COPD (chronic obstructive pulmonary disease) ([place])     Depression     Dyspnea     Headache     History of kidney stones     History of shingles     Hypertension     Insomnia     On home oxygen therapy      bedtime and prn   Restless leg syndrome               Past Surgical History:  Procedure Laterality Date   CARDIAC CATHETERIZATION   04/2014    Dr. Idelle Leech   CARDIAC CATHETERIZATION       CARDIAC DEFIBRILLATOR PLACEMENT       CHOLECYSTECTOMY N/A 08/23/2016    Procedure: LAPAROSCOPIC CHOLECYSTECTOMY;  Surgeon: Clayburn Pert, MD;  Location: [facility] ORS;  Service: General;  Laterality: N/A;   [place] N/A 06/27/2018    Procedure: DILATATION & CURETTAGE/HYSTEROSCOPY WITH MYOSURE;  Surgeon: Malachy Mood, MD;  Location: [facility] ORS;  Service: Gynecology;  Laterality: N/A;   HERNIA REPAIR       LAPAROSCOPIC APPENDECTOMY N/A 02/17/2018    Procedure: APPENDECTOMY LAPAROSCOPIC;  Surgeon: Benjamine Sprague, DO;  Location: [facility] ORS;  Service: General;  Laterality: N/A;           Family History  Problem Relation Age of Onset   Diabetes Mellitus II Mother     Hypertension Mother      Heart attack Father     Heart disease Brother     Arthritis Sister     Depression Daughter     Depression Son     Schizophrenia Son     Arthritis Sister     Diabetes Brother      Social History:  reports that she has quit smoking. Her smoking use included cigarettes. She smoked an average of .25 packs per day. She has never used smokeless tobacco. She reports that she does not drink alcohol and does not use drugs.   Allergies:       Allergies  Allergen Reactions   Levaquin [Levofloxacin] Anaphylaxis   Amoxicillin Hives      Did it involve swelling of the face/tongue/throat, SOB, or low BP? No Did it involve sudden or severe rash/hives, skin peeling, or any reaction on the inside of your mouth or nose? No Did you need to seek medical attention at a hospital or doctor's office? No When did it last happen?      10+ years If all above answers are "NO", may proceed with cephalosporin use.   Nitrofuran Derivatives  Other (See Comments)      Unknown   Penicillins Other (See Comments)      Thinks it made her itch a lot, but isn't sure. Did it involve swelling of the face/tongue/throat, SOB, or low BP? No Did it involve sudden or severe rash/hives, skin peeling, or any reaction on the inside of your mouth or nose? No Did you need to seek medical attention at a hospital or doctor's office? No When did it last happen?      10+ years If all above answers are "NO", may proceed with cephalosporin use.     Zithromax [Azithromycin] Other (See Comments)   Antihistamines, Chlorpheniramine-Type Rash      (Not in a hospital admission)     Lab Results Last 48 Hours        Results for orders placed or performed during the hospital encounter of 08/07/20 (from the past 48 hour(s))  Protime-INR     Status: Abnormal    Collection Time: 08/07/20 12:41 PM  Result Value Ref Range    Prothrombin Time 15.4 (H) 11.4 - 15.2 seconds    INR 1.2 0.8 - 1.2      Comment: (NOTE) INR goal varies based on  device and disease states. Performed at [facility], [place] [address], [place], [place] [address]    APTT     Status: None    Collection Time: 08/07/20 12:41 PM  Result Value Ref Range    aPTT 33 24 - 36 seconds      Comment: Performed at [facility] [address] [place], [place] [address]  CBC     Status: Abnormal    Collection Time: 08/07/20 12:41 PM  Result Value Ref Range    WBC 4.5 4.0 - 10.5 K/uL    RBC 4.21 3.87 - 5.11 MIL/uL    Hemoglobin 11.6 (L) 12.0 - 15.0 g/dL    HCT 38.4 36.0 - 46.0 %    MCV 91.2 80.0 - 100.0 fL    MCH 27.6 26.0 - 34.0 pg    MCHC 30.2 30.0 - 36.0 g/dL    RDW 16.9 (H) 11.5 - 15.5 %    Platelets 151 150 - 400 K/uL    nRBC 0.0 0.0 - 0.2 %      Comment: Performed at [facility], [place] [address] [place], [place] [address]  Differential     Status: None    Collection Time: 08/07/20 12:41 PM  Result Value Ref Range    Neutrophils Relative % 58 %    Neutro Abs 2.6 1.7 - 7.7 K/uL    Lymphocytes Relative 24 %    Lymphs Abs 1.1 0.7 - 4.0 K/uL    Monocytes Relative 14 %    Monocytes Absolute 0.6 0.1 - 1.0 K/uL    Eosinophils Relative 3 %    Eosinophils Absolute 0.1 0.0 - 0.5 K/uL    Basophils Relative 1 %    Basophils Absolute 0.0 0.0 - 0.1 K/uL    Immature Granulocytes 0 %    Abs Immature Granulocytes 0.01 0.00 - 0.07 K/uL      Comment: Performed at [facility] [address], [place], [place] [address]  Comprehensive metabolic panel     Status: Abnormal    Collection Time: 08/07/20 12:41 PM  Result Value Ref Range    Sodium 136 135 - 145 mmol/L    Potassium 5.1 3.5 - 5.1 mmol/L    Chloride 99  98 - 111 mmol/L    CO2 27 22 - 32 mmol/L    Glucose, Bld 112 (H) 70 - 99 mg/dL      Comment: Glucose reference range applies only to samples taken after fasting for at least 8 hours.    BUN 63 (H) 8 - 23 mg/dL    Creatinine, Ser 2.50 (H) 0.44 - 1.00 mg/dL    Calcium 9.2 8.9 - 10.3 mg/dL    Total Protein 6.7 6.5 - 8.1 g/dL     Albumin 3.3 (L) 3.5 - 5.0 g/dL    AST 16 15 - 41 U/L    ALT 10 0 - 44 U/L    Alkaline Phosphatase 57 38 - 126 U/L    Total Bilirubin 1.4 (H) 0.3 - 1.2 mg/dL    GFR, Estimated 20 (L) >60 mL/min      Comment: (NOTE) Calculated using the CKD-EPI Creatinine Equation (2021)      Anion gap 10 5 - 15      Comment: Performed at [facility], [place] [address] [place], [place] [address]  CBG monitoring, ED     Status: Abnormal    Collection Time: 08/07/20 12:43 PM  Result Value Ref Range    Glucose-Capillary 115 (H) 70 - 99 mg/dL      Comment: Glucose reference range applies only to samples taken after fasting for at least 8 hours.    Comment 1 Notify RN    I-stat chem 8, ED     Status: Abnormal    Collection Time: 08/07/20  1:02 PM  Result Value Ref Range    Sodium 136 135 - 145 mmol/L    Potassium 5.0 3.5 - 5.1 mmol/L    Chloride 99 98 - 111 mmol/L    BUN 57 (H) 8 - 23 mg/dL    Creatinine, Ser 2.40 (H) 0.44 - 1.00 mg/dL    Glucose, Bld 113 (H) 70 - 99 mg/dL      Comment: Glucose reference range applies only to samples taken after fasting for at least 8 hours.    Calcium, Ion 1.11 (L) 1.15 - 1.40 mmol/L    TCO2 29 22 - 32 mmol/L    Hemoglobin 13.3 12.0 - 15.0 g/dL    HCT 39.0 36.0 - 46.0 %  Resp Panel by RT-PCR (Flu A&B, Covid) Nasopharyngeal Swab     Status: Abnormal    Collection Time: 08/07/20  1:36 PM    Specimen: Nasopharyngeal Swab; Nasopharyngeal(NP) swabs in vial transport medium  Result Value Ref Range    SARS Coronavirus 2 by RT PCR POSITIVE (A) NEGATIVE      Comment: RESULT CALLED TO, READ BACK BY AND VERIFIED WITH: RN KATRINA Y. [id number] FCP (NOTE) SARS-CoV-2 target nucleic acids are DETECTED.   The SARS-CoV-2 RNA is generally detectable in upper respiratory specimens during the acute phase of infection. Positive results are indicative of the presence of the identified virus, but do not rule out bacterial infection or co-infection with other pathogens  not detected by the test. Clinical correlation with patient history and other diagnostic information is necessary to determine patient infection status. The expected result is Negative.   Fact Sheet for Patients: EntrepreneurPulse.com.au   Fact Sheet for Healthcare Providers: IncredibleEmployment.be   This test is not yet approved or cleared by the [place] FDA and has been authorized for detection and/or diagnosis of SARS-CoV-2 by FDA under an Emergency Use Authorization (EUA).  This EUA will remain in effect (meaning  this test can be use d) for the duration of the COVID-19 declaration under Section 564(b)(1) of the Act, 21 U.S.C. section 360bbb-3(b)(1), unless the authorization is terminated or revoked sooner.        Influenza A by PCR NEGATIVE NEGATIVE    Influenza B by PCR NEGATIVE NEGATIVE      Comment: (NOTE) The Xpert Xpress SARS-CoV-2/FLU/RSV plus assay is intended as an aid in the diagnosis of influenza from Nasopharyngeal swab specimens and should not be used as a sole basis for treatment. Nasal washings and aspirates are unacceptable for Xpert Xpress SARS-CoV-2/FLU/RSV testing.   Fact Sheet for Patients: EntrepreneurPulse.com.au   Fact Sheet for Healthcare Providers: IncredibleEmployment.be   This test is not yet approved or cleared by the [place] FDA and has been authorized for detection and/or diagnosis of SARS-CoV-2 by FDA under an Emergency Use Authorization (EUA). This EUA will remain in effect (meaning this test can be used) for the duration of the COVID-19 declaration under Section 564(b)(1) of the Act, 21 U.S.C. section 360bbb-3(b)(1), unless the authorization is terminated or revoked.   Performed at [facility], [place] [address] [place], [place] [address]    Brain natriuretic peptide     Status: Abnormal    Collection Time: 08/07/20  1:44 PM  Result Value Ref  Range    B Natriuretic Peptide 1,647.8 (H) 0.0 - 100.0 pg/mL      Comment: Performed at [facility] [address], [place], [place] [address]  Troponin I (High Sensitivity)     Status: Abnormal    Collection Time: 08/07/20  1:44 PM  Result Value Ref Range    Troponin I (High Sensitivity) 61 (H) <18 ng/L      Comment: (NOTE) Elevated high sensitivity troponin I (hsTnI) values and significant changes across serial measurements may suggest ACS but many other chronic and acute conditions are known to elevate hsTnI results. Refer to the "Links" section for chest pain algorithms and additional guidance. Performed at [facility], [place] [address] [place], [place] [address]         Imaging Results (Last 48 hours)  CT HEAD WO CONTRAST   Result Date: 08/07/2020 CLINICAL DATA:  Neuro deficit, acute, stroke suspected Slurred speech x2 weeks. Two recent falls with head injury. Right headache EXAM: CT HEAD WITHOUT CONTRAST TECHNIQUE: Contiguous axial images were obtained from the base of the skull through the vertex without intravenous contrast. COMPARISON:  None. FINDINGS: Brain: Normal anatomic configuration. Parenchymal volume loss is commensurate with the patient's age. Mild periventricular white matter changes are present likely reflecting the sequela of small vessel ischemia. No abnormal intra or extra-axial mass lesion or fluid collection. No abnormal mass effect or midline shift. No evidence of acute intracranial hemorrhage or infarct. Ventricular size is normal. Cerebellum unremarkable. Vascular: No asymmetric hyperdense vasculature at the skull base. Skull: Intact Sinuses/Orbits: There is moderate mucosal thickening within the right maxillary sinus. Mild thickening within the left maxillary sinus and left frontal sinus. No air-fluid levels. Remaining paranasal sinuses are clear. The orbits are unremarkable. Other: Mastoid air cells and middle ear cavities are clear. IMPRESSION: No  acute intracranial injury.  No calvarial fracture. Mild senescent change. Mild bilateral paranasal sinus disease. Electronically Signed   By: Fidela Salisbury MD   On: 08/07/2020 13:17   DG Tibia/Fibula Left Port   Result Date: 08/07/2020 CLINICAL DATA:  Suspected stroke. Injury to LOWER leg with oxygen tank on 08/02/2020. Legs are red  and swollen. EXAM: PORTABLE LEFT TIBIA AND FIBULA - 2 VIEW COMPARISON:  None. FINDINGS: No acute fracture or subluxation. Atherosclerotic calcification present. Diffuse soft tissue edema. Note is made of small plantar calcaneal spur. IMPRESSION: Diffuse soft tissue swelling. No evidence for acute osseous abnormality. Electronically Signed   By: Nolon Nations M.D.   On: 08/07/2020 16:39       Review Of Systems Constitutional: No fever, chills, weight loss or gain. Eyes: No vision change, wears glasses. No discharge or pain. Ears: No hearing loss, No tinnitus. Respiratory: No asthma, COPD, pneumonias. Positive shortness of breath. No hemoptysis. Cardiovascular: No chest pain, palpitation, positive leg edema. Gastrointestinal: No nausea, vomiting, diarrhea, constipation. No GI bleed. No hepatitis. Genitourinary: No dysuria, hematuria, kidney stone. No incontinance. Neurological: No headache, stroke, seizures. Psychiatry: No psych facility admission for anxiety, depression, suicide. No detox. Skin: No rash. Musculoskeletal: Positive joint pain, fibromyalgia. No neck pain, back pain. Lymphadenopathy: No lymphadenopathy. Hematology: No anemia or easy bruising.     Blood pressure (!) 112/54, pulse 81, temperature 98.1 F (36.7 C), temperature source Oral, resp. rate 20, SpO2 100 %. There is no height or weight on file to calculate BMI. General appearance: alert, cooperative, appears stated age and moderate respiratory distress Head: Normocephalic, atraumatic. Eyes: Hazel eyes, pink conjunctiva, corneas clear. PERRL, EOM's intact. Neck: No adenopathy, no carotid  bruit, no JVD, supple, symmetrical, trachea midline and thyroid not enlarged. Resp: Clear to auscultation bilaterally. Cardio: Regular rate and rhythm, S1, S2 normal, II/VI systolic murmur, no click, rub or gallop GI: Soft, non-tender; bowel sounds normal; no organomegaly. Extremities: 2 + edema up to knees, no cyanosis or clubbing. Skin: Warm and dry. Neurologic: Alert and oriented X 1, normal strength.   Assessment/Plan Acute on chronic left systolic heart failure Dilated cardiomyopathy CAD HTN COPD S/P COVID infection Acute renal failure   Admit IV lasix Echocardiogram. R/O MI Chest x-ray.   Time spent: Review of old records, Lab, x-rays, EKG, other cardiac tests, examination, discussion with patient/Nurse/Family/referring doctor over 70 minutes.   Birdie Riddle, MD   08/07/2020, 5:03 PM

## 2020-08-09 ENCOUNTER — Inpatient Hospital Stay (HOSPITAL_COMMUNITY): Payer: Medicare Other

## 2020-08-09 DIAGNOSIS — I5021 Acute systolic (congestive) heart failure: Secondary | ICD-10-CM

## 2020-08-09 DIAGNOSIS — I4819 Other persistent atrial fibrillation: Secondary | ICD-10-CM

## 2020-08-09 DIAGNOSIS — I472 Ventricular tachycardia: Secondary | ICD-10-CM | POA: Diagnosis not present

## 2020-08-09 LAB — BASIC METABOLIC PANEL
Anion gap: 10 (ref 5–15)
BUN: 58 mg/dL — ABNORMAL HIGH (ref 8–23)
CO2: 28 mmol/L (ref 22–32)
Calcium: 9.2 mg/dL (ref 8.9–10.3)
Chloride: 99 mmol/L (ref 98–111)
Creatinine, Ser: 2.1 mg/dL — ABNORMAL HIGH (ref 0.44–1.00)
GFR, Estimated: 25 mL/min — ABNORMAL LOW (ref >60)
Glucose, Bld: 115 mg/dL — ABNORMAL HIGH (ref 70–99)
Potassium: 4.8 mmol/L (ref 3.5–5.1)
Sodium: 137 mmol/L (ref 135–145)

## 2020-08-09 LAB — CBC
HCT: 37.9 % (ref 36.0–46.0)
Hemoglobin: 11.6 g/dL — ABNORMAL LOW (ref 12.0–15.0)
MCH: 28 pg (ref 26.0–34.0)
MCHC: 30.6 g/dL (ref 30.0–36.0)
MCV: 91.5 fL (ref 80.0–100.0)
Platelets: 148 10*3/uL — ABNORMAL LOW (ref 150–400)
RBC: 4.14 MIL/uL (ref 3.87–5.11)
RDW: 17 % — ABNORMAL HIGH (ref 11.5–15.5)
WBC: 5.8 10*3/uL (ref 4.0–10.5)
nRBC: 0.3 % — ABNORMAL HIGH (ref 0.0–0.2)

## 2020-08-09 LAB — MRSA NEXT GEN BY PCR, NASAL: MRSA by PCR Next Gen: NOT DETECTED

## 2020-08-09 LAB — MAGNESIUM: Magnesium: 2.4 mg/dL (ref 1.7–2.4)

## 2020-08-09 LAB — HEPARIN LEVEL (UNFRACTIONATED)
Heparin Unfractionated: 0.85 IU/mL — ABNORMAL HIGH (ref 0.30–0.70)
Heparin Unfractionated: 1.02 IU/mL — ABNORMAL HIGH (ref 0.30–0.70)
Heparin Unfractionated: 0.8 IU/mL — ABNORMAL HIGH (ref 0.30–0.70)

## 2020-08-09 LAB — PROCALCITONIN: Procalcitonin: <0.1 ng/mL

## 2020-08-09 MED ORDER — ALBUTEROL SULFATE (2.5 MG/3ML) 0.083% IN NEBU: 2.5000 mg | INHALATION_SOLUTION | Freq: Two times a day (BID) | RESPIRATORY_TRACT | Status: DC

## 2020-08-09 MED ORDER — ALBUTEROL SULFATE HFA 108 (90 BASE) MCG/ACT IN AERS: 2.0000 | INHALATION_SPRAY | Freq: Four times a day (QID) | RESPIRATORY_TRACT | Status: DC | PRN

## 2020-08-09 MED ORDER — HEPARIN (PORCINE) 25000 UT/250ML-% IV SOLN
1000.0000 [IU]/h | INTRAVENOUS | Status: DC
Start: 2020-08-09 — End: 2020-08-10
  Filled 2020-08-09: qty 250

## 2020-08-09 MED ORDER — TECHNETIUM TO 99M ALBUMIN AGGREGATED
4.3000 | Freq: Once | INTRAVENOUS | Status: AC | PRN
  Administered 2020-08-09: 4.3 via INTRAVENOUS

## 2020-08-09 NOTE — Consult Note (Addendum)
Cardiology Consultation:   Patient ID: KIMISHA EUNICE MRN: [id number]; DOB: 1950/11/19  Admit date: 08/07/2020 Date of Consult: 08/09/2020  PCP:  Jon Billings, NP  Cardiologist:  Dr. Josefa Half  Patient Profile:   Maria Neal is a 70 y.o. female with a hx of NICM, ICD, LBBB, chronic CHF (systolic), HTN, HLD, COPD/chronic bronchitis (on 3L at home), chronic venous insuff. (status post laser ablation of the left great saphenous vein on 02/06/2019) who is being seen 08/09/2020 for the evaluation of VT at the request of Dr. Terrence Dupont.  Dr. Josefa Half last note 08/02/20 mentions: Normal coronary anatomy by cardiac cath 04/14/2014 CRT-D interrogation on 05/18/2020 revealed normal function with predominant atrial sensing with ventricular pacing, 3 episodes of nonsustained VT, 1 episode of atrial tachycardia/atrial fibrillation for 19 days, with a longevity of 9 months  Discussed recent issues with LE edema to thighs that was better  History of Present Illness:   Ms. Tuff was admitted 08/07/20 with increased SOB, reportedly 4 weeks post COVID infection, admitted with acute/chronic CHF exacerbation, started on IV lasix and planned to update her echo. Yesterday with some degree if confusion her diuretics and BP meds held.  Todayless confused, echo with LVEF 20-25%, global hypokinesis RVEF severely reduced RVSP 68mmHg LA/RA severely dilated mod-Severe MR Severe TR Mild-mod AI Cards progress note today mentions Questionable calcium deposit v/s endocarditis over ICD wire in RA. Planning TEE and discussed possibly palliative consult  EP is consulted for episode of VT on telemetry  LABS K+ 4.7 > 4.8 BUN/Creat 58/2.10 Mag 2.4 WBC 5.8 H/H 11.6/37.9 Plts 148  HS Trop 61, 62 pBNP 1647   ICD information MDT CRT-D implanted 12/02/2014  Interrogation done this AM Battery is 4 mo to ERI Lead measurements are good AFib since roughly April/May time Optivol markedly elevated VP  (since 2020)  98.1% VSR pacing 1.3% BIVe pacing 99.9%  One monitored VT episode 7 seconds, not printed by industry rep for review, verbally reported by industry to have been 2 years ago Currently in AFib and appeared to be since about May    The patient is AAO to self, know she is in a hospital for her heart Not orients to time, president Very pleasent and communicative Can not recall why she came to the hospital Denies any CP "ever" she does reports chronic swelling to her legs though is much better the last few months No active SOB and wears O2 at home as needed only  She reports 2 brief near syncopal/syncopal events The last a few weeks ago bending over to get something and things went black for a second and she fell forward.  (Same description for the 1st even, "some time ago")  ER MD notes: Family state that she has had some confusion over the past week with some slurred speech.  She has had 2 falls in the past week.  Family thought it could be related to her gabapentin.  The patient fell earlier today and hit her right forehead.  She is not on anticoagulation.  She arrived to [facility] emergency department alert and oriented x3 Head CT was neg for acute abnormalities  Past Medical History:  Diagnosis Date   AICD (automatic cardioverter/defibrillator) present    Anxiety    CAD (coronary artery disease)    Cardiomyopathy ([place])    CHF (congestive heart failure) (HCC)    COPD (chronic obstructive pulmonary disease) (HCC)    Depression    Dyspnea    Headache  History of kidney stones    History of shingles    Hypertension    Insomnia    On home oxygen therapy    bedtime and prn   Restless leg syndrome     Past Surgical History:  Procedure Laterality Date   CARDIAC CATHETERIZATION  04/2014   Dr. Idelle Leech   CARDIAC CATHETERIZATION     CARDIAC DEFIBRILLATOR PLACEMENT     CHOLECYSTECTOMY N/A 08/23/2016   Procedure: LAPAROSCOPIC CHOLECYSTECTOMY;  Surgeon: Clayburn Pert, MD;  Location:  [facility] ORS;  Service: General;  Laterality: N/A;   [place] N/A 06/27/2018   Procedure: [place];  Surgeon: Malachy Mood, MD;  Location: [facility] ORS;  Service: Gynecology;  Laterality: N/A;   HERNIA REPAIR     LAPAROSCOPIC APPENDECTOMY N/A 02/17/2018   Procedure: APPENDECTOMY LAPAROSCOPIC;  Surgeon: Benjamine Sprague, DO;  Location: [facility] ORS;  Service: General;  Laterality: N/A;     Home Medications:  Prior to Admission medications   Medication Sig Start Date End Date Taking? Authorizing Provider  albuterol (PROVENTIL HFA;VENTOLIN HFA) 108 (90 Base) MCG/ACT inhaler Inhale 2 puffs into the lungs every 6 (six) hours as needed for wheezing or shortness of breath.   Yes [provider]  aspirin EC 81 MG tablet Take 81 mg by mouth daily.   Yes [provider]  benzonatate (TESSALON) 200 MG capsule Take 1 capsule (200 mg total) by mouth 2 (two) times daily as needed for cough. 07/19/20  Yes Jon Billings, NP  carvedilol (COREG) 25 MG tablet Take 25 mg by mouth 2 (two) times daily. 04/12/17  Yes [provider]  cyclobenzaprine (FLEXERIL) 5 MG tablet TAKE 1 TABLET BY MOUTH 3 TIMES DAILY AS NEEDED Patient taking differently: Take 5 mg by mouth 3 (three) times daily as needed for muscle spasms. 04/04/19  Yes Lucilla Lame, MD  diclofenac Sodium (VOLTAREN) 1 % GEL Apply 2 g topically 2 (two) times daily as needed (pain). 01/21/20  Yes [provider]  fluticasone (FLONASE) 50 MCG/ACT nasal spray Place 2 sprays into both nostrils daily. 07/26/18  Yes Volney American, PA-C  Fluticasone-Umeclidin-Vilant (TRELEGY ELLIPTA) 100-62.5-25 MCG/INH AEPB Inhale 1 puff into the lungs daily. 02/12/20  Yes [provider]  furosemide (LASIX) 40 MG tablet Take 1 tablet (40 mg total) by mouth daily. 03/10/20  Yes Jon Billings, NP  gabapentin (NEURONTIN) 300 MG capsule TAKE 1 CAPSULE BY MOUTH 3 TIMES  DAILY Patient taking differently: Take 300 mg by mouth 3 (three) times daily. 06/02/20  Yes Jon Billings, NP  loratadine (CLARITIN) 10 MG tablet Take 1 tablet (10 mg total) by mouth daily. 04/03/19  Yes Kris Hartmann, NP  losartan (COZAAR) 50 MG tablet Take 50 mg by mouth daily. 04/10/17  Yes [provider]  nystatin cream (MYCOSTATIN) Apply 1 application topically 2 (two) times daily. Patient taking differently: Apply 1 application topically 2 (two) times daily as needed for dry skin. 05/20/20  Yes Jon Billings, NP  omeprazole (PRILOSEC) 20 MG capsule Take 1 capsule (20 mg total) by mouth daily. 05/03/20  Yes Jon Billings, NP  OXYGEN Inhale 3 L into the lungs 3 (three) times daily as needed (shortness of breath or coughing).   Yes [provider]  spironolactone (ALDACTONE) 25 MG tablet Take 12.5 mg by mouth daily. 04/09/17  Yes [provider]  triamcinolone (KENALOG) 0.025 % cream Apply 1 application topically 2 (two) times daily. Patient taking differently: Apply 1 application topically  2 (two) times daily as needed (rash). 03/10/20  Yes Jon Billings, NP    Inpatient Medications: Scheduled Meds:  fluticasone  2 spray Each Nare Daily   gabapentin  100 mg Oral TID   loratadine  10 mg Oral Daily   pantoprazole  40 mg Oral Daily   sodium chloride flush  3 mL Intravenous Once   sodium chloride flush  3 mL Intravenous Q12H   spironolactone  12.5 mg Oral Daily   vancomycin variable dose per unstable renal function (pharmacist dosing)   Does not apply See admin instructions   Continuous Infusions:  sodium chloride     sodium chloride     heparin 1,350 Units/hr (08/09/20 0204)   PRN Meds: sodium chloride, acetaminophen, albuterol, benzonatate, diclofenac Sodium, ondansetron (ZOFRAN) IV, sodium chloride flush, triamcinolone  Allergies:    Allergies  Allergen Reactions   Levaquin [Levofloxacin] Anaphylaxis   Amoxicillin Hives    Did it involve  swelling of the face/tongue/throat, SOB, or low BP? No Did it involve sudden or severe rash/hives, skin peeling, or any reaction on the inside of your mouth or nose? No Did you need to seek medical attention at a hospital or doctor's office? No When did it last happen?      10+ years If all above answers are "NO", may proceed with cephalosporin use.   Nitrofuran Derivatives Other (See Comments)    Unknown   Penicillins Other (See Comments)    Thinks it made her itch a lot, but isn't sure. Did it involve swelling of the face/tongue/throat, SOB, or low BP? No Did it involve sudden or severe rash/hives, skin peeling, or any reaction on the inside of your mouth or nose? No Did you need to seek medical attention at a hospital or doctor's office? No When did it last happen?      10+ years If all above answers are "NO", may proceed with cephalosporin use.    Zithromax [Azithromycin] Other (See Comments)   Antihistamines, Chlorpheniramine-Type Rash    Social History:   Social History   Socioeconomic History   Marital status: Divorced    Spouse name: Not on file   Number of children: Not on file   Years of education: Not on file   Highest education level: High school graduate  Occupational History   Occupation: retired  Tobacco Use   Smoking status: Former    Packs/day: 0.25    Types: Cigarettes   Smokeless tobacco: Never   Tobacco comments:    quit at age 35, whole pack lasted 3 weeks   Vaping Use   Vaping Use: Never used  Substance and Sexual Activity   Alcohol use: No    Alcohol/week: 0.0 standard drinks   Drug use: No   Sexual activity: Not Currently    Birth control/protection: None  Other Topics Concern   Not on file  Social History Narrative   Not on file   Social Determinants of Health   Financial Resource Strain: Not on file  Food Insecurity: Not on file  Transportation Needs: Not on file  Physical Activity: Not on file  Stress: Not on file  Social  Connections: Not on file  Intimate Partner Violence: Not on file    Family History:   Family History  Problem Relation Age of Onset   Diabetes Mellitus II Mother    Hypertension Mother    Heart attack Father    Heart disease Brother    Arthritis Sister    Depression  Daughter    Depression Son    Schizophrenia Son    Arthritis Sister    Diabetes Brother      ROS:  Please see the history of present illness.  All other ROS reviewed and negative.     Physical Exam/Data:   Vitals:   08/08/20 2014 08/08/20 2040 08/09/20 0305 08/09/20 0743  BP: (!) 96/49  (!) 96/54 (!) 100/55  Pulse: 92 91 92 90  Resp: 19 20 15 16   Temp: 97.7 F (36.5 C)  97.6 F (36.4 C) 97.7 F (36.5 C)  TempSrc: Oral  Oral Oral  SpO2: 97% 95% 95% 92%  Weight:   88.5 kg   Height:        Intake/Output Summary (Last 24 hours) at 08/09/2020 1037 Last data filed at 08/09/2020 0300 Gross per 24 hour  Intake 147.77 ml  Output 350 ml  Net -202.23 ml   Last 3 Weights 08/09/2020 06/25/2020 05/03/2020  Weight (lbs) 195 lb 187 lb 2 oz 183 lb 4 oz  Weight (kg) 88.451 kg 84.879 kg 83.122 kg     Body mass index is 34.54 kg/m.  General:  Well nourished, well developed, in no acute distress HEENT: normal Lymph: no adenopathy Neck: no JVD Endocrine:  No thryomegaly Vascular: No carotid bruits; FA pulses 2+ bilaterally without bruits  Cardiac:  regularly irregular; 2/6 SM, no gallops, no rubs Lungs:  CTA b/l, no wheezing, rhonchi or rales  Abd: soft, nontender  Ext: trace-1+ edema Musculoskeletal:  No deformities Skin: warm and dry  Neuro:  AAO self and hospitalization,  no gross focal motor abnormalities noted Psych:  Normal affect   EKG:  The EKG was personally reviewed and demonstrates:   V paced, V bigeminy (with VS response pacing most likely) 87bpm,  V paced, PVCs 90bpm   Telemetry:  Telemetry was personally reviewed and demonstrates:   Largely in a bigeminal rhythm, when able to see better, at times of  slowing looks Afib  Relevant CV Studies:   08/08/20: TTE IMPRESSIONS   1. Left ventricular ejection fraction, by estimation, is 20 to 25%. The  left ventricle has severely decreased function. The left ventricle  demonstrates global hypokinesis. The left ventricular internal cavity size  was severely dilated. Left ventricular  diastolic parameters are indeterminate.   2. Right ventricular systolic function is severely reduced. The right  ventricular size is moderately enlarged. There is severely elevated  pulmonary artery systolic pressure.   3. Left atrial size was severely dilated.   4. Right atrial size was severely dilated.   5. The mitral valve is degenerative. Moderate to severe mitral valve  regurgitation. Moderate mitral annular calcification.   6. Tricuspid valve regurgitation is severe.   7. The aortic valve is tricuspid. There is moderate calcification of the  aortic valve. There is mild thickening of the aortic valve. Aortic valve  regurgitation is mild to moderate. Mild to moderate aortic valve  sclerosis/calcification is present,  without any evidence of aortic stenosis.   8. There is mild (Grade II) atheroma plaque involving the aortic root and  ascending aorta.   9. The inferior vena cava is dilated in size with <50% respiratory  variability, suggesting right atrial pressure of 15 mmHg.   FINDINGS   Left Ventricle: Left ventricular ejection fraction, by estimation, is 20  to 25%. The left ventricle has severely decreased function. The left  ventricle demonstrates global hypokinesis. The left ventricular internal  cavity size was severely dilated.  There  is borderline asymmetric left ventricular hypertrophy of the  posterior segment. Left ventricular diastolic parameters are  indeterminate.   Right Ventricle: The right ventricular size is moderately enlarged. No  increase in right ventricular wall thickness. Right ventricular systolic  function is severely  reduced. There is severely elevated pulmonary artery  systolic pressure. The tricuspid  regurgitant velocity is 3.56 m/s, and with an assumed right atrial  pressure of 15 mmHg, the estimated right ventricular systolic pressure is  79.0 mmHg.   Left Atrium: Left atrial size was severely dilated.   Right Atrium: Right atrial size was severely dilated.   Pericardium: There is no evidence of pericardial effusion.   Mitral Valve: The mitral valve is degenerative in appearance. There is  mild thickening of the mitral valve leaflet(s). There is moderate  calcification of the mitral valve leaflet(s). Moderate mitral annular  calcification. Moderate to severe mitral valve  regurgitation.   Tricuspid Valve: The tricuspid valve is grossly normal. Tricuspid valve  regurgitation is severe.   Aortic Valve: The aortic valve is tricuspid. There is moderate  calcification of the aortic valve. There is mild thickening of the aortic  valve. There is moderate to severe aortic valve annular calcification.  Aortic valve regurgitation is mild to  moderate. Aortic regurgitation PHT measures 472 msec. Mild to moderate  aortic valve sclerosis/calcification is present, without any evidence of  aortic stenosis. Aortic valve mean gradient measures 3.0 mmHg. Aortic  valve peak gradient measures 6.4 mmHg.  Aortic valve area, by VTI measures 1.26 cm.   Pulmonic Valve: The pulmonic valve was normal in structure. Pulmonic valve  regurgitation is not visualized.   Aorta: The aortic root is normal in size and structure. There is mild  (Grade II) atheroma plaque involving the aortic root and ascending aorta.   Venous: The inferior vena cava is dilated in size with less than 50%  respiratory variability, suggesting right atrial pressure of 15 mmHg.   IAS/Shunts: The atrial septum is grossly normal.   Additional Comments: A device lead is visualized in the right atrium and  right ventricle.    05/30/19:  TTE INTERPRETATION  SEVERE LV SYSTOLIC DYSFUNCTION (See above) 24% MILD RV SYSTOLIC DYSFUNCTION (See above)  MODERATE VALVULAR REGURGITATION (See above)  NO VALVULAR STENOSIS  MIODERATe MR, TR  MODERATE PHTN  MILD AR, PR  EF 25%   Laboratory Data:  High Sensitivity Troponin:   Recent Labs  Lab 08/07/20 1344 08/07/20 1544  TROPONINIHS 61* 62*     Chemistry Recent Labs  Lab 08/07/20 1241 08/07/20 1302 08/07/20 1735 08/08/20 0505 08/09/20 0038  NA 136 136  --  138 137  K 5.1 5.0  --  4.7 4.8  CL 99 99  --  100 99  CO2 27  --   --  28 28  GLUCOSE 112* 113*  --  101* 115*  BUN 63* 57*  --  61* 58*  CREATININE 2.50* 2.40* 2.55* 2.36* 2.10*  CALCIUM 9.2  --   --  9.0 9.2  GFRNONAA 20*  --  20* 22* 25*  ANIONGAP 10  --   --  10 10    Recent Labs  Lab 08/07/20 1241  PROT 6.7  ALBUMIN 3.3*  AST 16  ALT 10  ALKPHOS 57  BILITOT 1.4*   Hematology Recent Labs  Lab 08/07/20 1241 08/07/20 1302 08/07/20 1735 08/09/20 0038  WBC 4.5  --  4.5 5.8  RBC 4.21  --  4.15 4.14  HGB  11.6* 13.3 11.8* 11.6*  HCT 38.4 39.0 37.7 37.9  MCV 91.2  --  90.8 91.5  MCH 27.6  --  28.4 28.0  MCHC 30.2  --  31.3 30.6  RDW 16.9*  --  16.7* 17.0*  PLT 151  --  143* 148*   BNP Recent Labs  Lab 08/07/20 1344  BNP 1,647.8*    DDimer  Recent Labs  Lab 08/08/20 1335  DDIMER 1.21*     Radiology/Studies:  CT HEAD WO CONTRAST Result Date: 08/07/2020 CLINICAL DATA:  Neuro deficit, acute, stroke suspected Slurred speech x2 weeks. Two recent falls with head injury. Right headache EXAM: CT HEAD WITHOUT CONTRAST TECHNIQUE: Contiguous axial images were obtained from the base of the skull through the vertex without intravenous contrast. COMPARISON:  None. FINDINGS: Brain: Normal anatomic configuration. Parenchymal volume loss is commensurate with the patient's age. Mild periventricular white matter changes are present likely reflecting the sequela of small vessel ischemia. No abnormal intra  or extra-axial mass lesion or fluid collection. No abnormal mass effect or midline shift. No evidence of acute intracranial hemorrhage or infarct. Ventricular size is normal. Cerebellum unremarkable. Vascular: No asymmetric hyperdense vasculature at the skull base. Skull: Intact Sinuses/Orbits: There is moderate mucosal thickening within the right maxillary sinus. Mild thickening within the left maxillary sinus and left frontal sinus. No air-fluid levels. Remaining paranasal sinuses are clear. The orbits are unremarkable. Other: Mastoid air cells and middle ear cavities are clear. IMPRESSION: No acute intracranial injury.  No calvarial fracture. Mild senescent change. Mild bilateral paranasal sinus disease. Electronically Signed   By: Fidela Salisbury MD   On: 08/07/2020 13:17   DG Chest Port 1 View Result Date: 08/07/2020 CLINICAL DATA:  Acute systolic heart failure EXAM: PORTABLE CHEST 1 VIEW COMPARISON:  03/14/2019 FINDINGS: Cardiomegaly. Left pacer/AICD remains in place, unchanged. No overt edema. No confluent opacities or effusions. IMPRESSION: No acute cardiopulmonary disease.  Cardiomegaly. Electronically Signed   By: Rolm Baptise M.D.   On: 08/07/2020 18:04   DG Tibia/Fibula Left Port Result Date: 08/07/2020 CLINICAL DATA:  Suspected stroke. Injury to LOWER leg with oxygen tank on 08/02/2020. Legs are red and swollen. EXAM: PORTABLE LEFT TIBIA AND FIBULA - 2 VIEW COMPARISON:  None. FINDINGS: No acute fracture or subluxation. Atherosclerotic calcification present. Diffuse soft tissue edema. Note is made of small plantar calcaneal spur. IMPRESSION: Diffuse soft tissue swelling. No evidence for acute osseous abnormality. Electronically Signed   By: Nolon Nations M.D.   On: 08/07/2020 16:39       VAS [place] LOWER EXTREMITY VENOUS (DVT) Result Date: 08/08/2020  Lower Venous DVT Study Patient Name:  CARIANNE TAIRA  Date of Exam:   08/08/2020 Medical Rec #: [id number]     Accession #:    [id number] Date of Birth:  11/15/1950     Patient Gender: F Patient Age:   3 years Exam Location:  [facility] Procedure:      VAS [place] LOWER EXTREMITY VENOUS (DVT) Referring Phys: Dixie Dials --------------------------------------------------------------------------------  Indications: Edema.  Limitations: Body habitus and poor ultrasound/tissue interface. Comparison Study: No previous exams Performing Technologist: Jody Hill RVT, RDMS  Examination Guidelines: A complete evaluation includes B-mode imaging, spectral Doppler, color Doppler, and power Doppler as needed of all accessible portions of each vessel. Bilateral testing is considered an integral part of a complete examination. Limited examinations for reoccurring indications may be performed as noted. The reflux portion of the exam is performed with the patient in reverse Trendelenburg.  Summary: BILATERAL: - No evidence of deep vein thrombosis seen in the lower extremities, bilaterally. - No evidence of superficial venous thrombosis in the lower extremities, bilaterally. -No evidence of popliteal cyst, bilaterally.   *See table(s) above for measurements and observations.    Preliminary      Assessment and Plan:   WCT (12.7seconds),  Slightly  irregular with a VS response paced beat in the middle (150's-170's) VT vs AFib w/RVR One zone programmed is VF >194  I will review with MD though do not suspect any changes other then resume some BB when felt able to clinically Hesitate to use amiodarone with months of AFib new a/c this hospital stay   2. Persistent AFib This seems a new Dx, not on a/c out pt CHA2DS2Vasc is 4 Heparin gtt here   Continue management with attending/cardiology team 3. AKI 4. Acute/chronic CHF 5. NICM     Severe VHD by her echo, pending TEE      Might benefit from AHF team 6. Few weeks of intermittent confusion, falls In setting of afib not on a/c would consider neuro eval    Risk Assessment/Risk Scores:    For questions or  updates, please contact [place] Please consult [email or web address] for contact info under    Signed, Baldwin [place], PA-C  08/09/2020 10:37 AM

## 2020-08-09 NOTE — Progress Notes (Signed)
Ref: Jon Billings, NP   Subjective:  Awake.  Sustained VT event 6-7 hours ago. Decreasing confusion. Monitor v-paced with VPCs in bigeminy. Needs ICD interrogation and if not optimal needs EP consult. No fever, no leukocytosis. Questionable calcium deposit v/s endocarditis over ICD wire in RA.  Objective:  Vital Signs in the last 24 hours: Temp:  [97.5 F (36.4 C)-97.7 F (36.5 C)] 97.7 F (36.5 C) (08/08 0743) Pulse Rate:  [90-114] 90 (08/08 0743) Cardiac Rhythm: Ventricular paced (08/08 0846) Resp:  [15-22] 16 (08/08 0743) BP: (95-100)/(49-60) 100/55 (08/08 0743) SpO2:  [91 %-97 %] 92 % (08/08 0743) Weight:  [88.5 kg] 88.5 kg (08/08 0305)  Physical Exam: BP Readings from Last 1 Encounters:  08/09/20 (!) 100/55     Wt Readings from Last 1 Encounters:  08/09/20 88.5 kg    Weight change:  Body mass index is 34.54 kg/m. HEENT: [place]/AT, Eyes-Hazel, Conjunctiva-Pink, Sclera-Non-icteric Neck: No JVD, No bruit, Trachea midline. Lungs:  Clearing, Bilateral. Cardiac:  Regular paced rhythm, normal S1 and S2, no S3. II/VI systolic murmur. Abdomen:  Soft, non-tender. BS present. Extremities:  No edema present. No cyanosis. No clubbing. CNS: AxOx3, Cranial nerves grossly intact, moves all 4 extremities.  Skin: Warm and dry.   Intake/Output from previous day: 08/07 0701 - 08/08 0700 In: 147.8 [I.V.:147.8] Out: 351 [Urine:351]    Lab Results: BMET    Component Value Date/Time   NA 137 08/09/2020 0038   NA 138 08/08/2020 0505   NA 136 08/07/2020 1302   NA 143 05/13/2020 1314   NA 144 05/03/2020 1334   NA 141 03/10/2020 1536   NA 139 12/07/2013 1207   NA 141 09/21/2013 1133   NA 137 04/22/2013 1032   K 4.8 08/09/2020 0038   K 4.7 08/08/2020 0505   K 5.0 08/07/2020 1302   K 3.8 12/07/2013 1207   K 4.1 09/21/2013 1133   K 3.7 04/22/2013 1032   CL 99 08/09/2020 0038   CL 100 08/08/2020 0505   CL 99 08/07/2020 1302   CL 99 12/07/2013 1207   CL 104 09/21/2013  1133   CL 102 04/22/2013 1032   CO2 28 08/09/2020 0038   CO2 28 08/08/2020 0505   CO2 27 08/07/2020 1241   CO2 31 12/07/2013 1207   CO2 32 09/21/2013 1133   CO2 31 04/22/2013 1032   GLUCOSE 115 (H) 08/09/2020 0038   GLUCOSE 101 (H) 08/08/2020 0505   GLUCOSE 113 (H) 08/07/2020 1302   GLUCOSE 97 12/07/2013 1207   GLUCOSE 97 09/21/2013 1133   GLUCOSE 143 (H) 04/22/2013 1032   BUN 58 (H) 08/09/2020 0038   BUN 61 (H) 08/08/2020 0505   BUN 57 (H) 08/07/2020 1302   BUN 27 05/13/2020 1314   BUN 35 (H) 05/03/2020 1334   BUN 29 (H) 03/10/2020 1536   BUN 8 12/07/2013 1207   BUN 15 09/21/2013 1133   BUN 15 04/22/2013 1032   CREATININE 2.10 (H) 08/09/2020 0038   CREATININE 2.36 (H) 08/08/2020 0505   CREATININE 2.55 (H) 08/07/2020 1735   CREATININE 0.77 12/07/2013 1207   CREATININE 0.79 09/21/2013 1133   CREATININE 0.76 04/22/2013 1032   CALCIUM 9.2 08/09/2020 0038   CALCIUM 9.0 08/08/2020 0505   CALCIUM 9.2 08/07/2020 1241   CALCIUM 8.7 12/07/2013 1207   CALCIUM 9.2 09/21/2013 1133   CALCIUM 8.9 04/22/2013 1032   GFRNONAA 25 (L) 08/09/2020 0038   GFRNONAA 22 (L) 08/08/2020 0505   GFRNONAA 20 (L) 08/07/2020 1735  GFRNONAA >60 12/07/2013 1207   GFRNONAA >60 09/21/2013 1133   GFRNONAA >60 04/22/2013 1032   GFRNONAA >60 02/23/2013 0506   GFRAA 41 (L) 09/04/2019 1114   GFRAA 58 (L) 03/28/2019 1336   GFRAA 55 (L) 03/19/2019 1509   GFRAA >60 12/07/2013 1207   GFRAA >60 09/21/2013 1133   GFRAA >60 04/22/2013 1032   GFRAA >60 02/23/2013 0506   CBC    Component Value Date/Time   WBC 5.8 08/09/2020 0038   RBC 4.14 08/09/2020 0038   HGB 11.6 (L) 08/09/2020 0038   HGB 13.6 09/05/2018 1401   HCT 37.9 08/09/2020 0038   HCT 40.4 09/05/2018 1401   PLT 148 (L) 08/09/2020 0038   PLT 178 09/05/2018 1401   MCV 91.5 08/09/2020 0038   MCV 88 09/05/2018 1401   MCV 91 12/07/2013 1207   MCH 28.0 08/09/2020 0038   MCHC 30.6 08/09/2020 0038   RDW 17.0 (H) 08/09/2020 0038   RDW 15.6 (H)  09/05/2018 1401   RDW 14.6 (H) 12/07/2013 1207   LYMPHSABS 1.1 08/07/2020 1241   LYMPHSABS 2.7 09/05/2018 1401   LYMPHSABS 1.0 02/23/2013 0506   MONOABS 0.6 08/07/2020 1241   MONOABS 0.1 (L) 02/23/2013 0506   EOSABS 0.1 08/07/2020 1241   EOSABS 0.0 02/23/2013 0506   BASOSABS 0.0 08/07/2020 1241   BASOSABS 0.0 02/23/2013 0506   HEPATIC Function Panel Recent Labs    05/03/20 1334 05/13/20 1314 08/07/20 1241  PROT 7.2 6.9 6.7   HEMOGLOBIN A1C No components found for: HGA1C,  MPG CARDIAC ENZYMES Lab Results  Component Value Date   TROPONINI <0.03 09/02/2014   TROPONINI <0.03 09/02/2014   TROPONINI <0.03 05/07/2014   BNP No results for input(s): PROBNP in the last 8760 hours. TSH No results for input(s): TSH in the last 8760 hours. CHOLESTEROL Recent Labs    03/10/20 1536  CHOL 202*    Scheduled Meds:  fluticasone  2 spray Each Nare Daily   gabapentin  100 mg Oral TID   loratadine  10 mg Oral Daily   pantoprazole  40 mg Oral Daily   sodium chloride flush  3 mL Intravenous Once   sodium chloride flush  3 mL Intravenous Q12H   spironolactone  12.5 mg Oral Daily   vancomycin variable dose per unstable renal function (pharmacist dosing)   Does not apply See admin instructions   Continuous Infusions:  sodium chloride     sodium chloride     heparin 1,350 Units/hr (08/09/20 0204)   PRN Meds:.sodium chloride, acetaminophen, albuterol, benzonatate, diclofenac Sodium, ondansetron (ZOFRAN) IV, sodium chloride flush, triamcinolone  Assessment/Plan:  Acute on chronic systolic left heart failure, HFrEF Sustained VT Dilated cardiomyopathy S/P ICD r/o infection CAD HTN COPD S/P COVID infection Acute renal failure Severe MR Severe TR Mild AI and PI  Plan: EP consult Discuss with family TEE. Discuss Hospice care if not in place.    LOS: 2 days   Time spent including chart review, lab review, examination, discussion with patient :  min   Dixie Dials  MD   08/09/2020, 8:54 AM

## 2020-08-09 NOTE — Progress Notes (Signed)
[place] for heparin Indication:  questionable clot in RA  Labs: Recent Labs    08/07/20 1241 08/07/20 1302 08/07/20 1344 08/07/20 1544 08/07/20 1735 08/08/20 0505 08/09/20 0038 08/09/20 0956  HGB 11.6* 13.3  --   --  11.8*  --  11.6*  --   HCT 38.4 39.0  --   --  37.7  --  37.9  --   PLT 151  --   --   --  143*  --  148*  --   APTT 33  --   --   --   --   --   --   --   LABPROT 15.4*  --   --   --   --   --   --   --   INR 1.2  --   --   --   --   --   --   --   HEPARINUNFRC  --   --   --   --   --   --  0.85* 1.02*  CREATININE 2.50* 2.40*  --   --  2.55* 2.36* 2.10*  --   TROPONINIHS  --   --  61* 62*  --   --   --   --    Assessment: 59 YOF who is hypotensive and borderline oxygen saturation now with elevated D-dimer concerning for pulmonary embolism. VQ scan ordered. In the meantime, pharmacy has been consulted to start IV heparin  -dopplers negative -VQ negative -Per Dr. Doylene Canard: some concern for RA clot on Echo ans plans for TEE  Goal of Therapy:  Heparin level 0.3-0.7 units/ml Monitor platelets by anticoagulation protocol: Yes   Plan:  -Hold heparin for 30 minutes then decrease to 1150 units/hr -Heparin level in 8 hours and daily wth CBC daily  Hildred Laser, PharmD Clinical Pharmacist **Pharmacist phone directory can now be found on [email or web address] ([email or web address]).  Listed under [place].

## 2020-08-09 NOTE — Progress Notes (Signed)
[place] for heparin Indication:  R/o pulmonary embolism  Labs: Recent Labs    08/07/20 1241 08/07/20 1302 08/07/20 1344 08/07/20 1544 08/07/20 1735 08/08/20 0505 08/09/20 0038  HGB 11.6* 13.3  --   --  11.8*  --  11.6*  HCT 38.4 39.0  --   --  37.7  --  37.9  PLT 151  --   --   --  143*  --  148*  APTT 33  --   --   --   --   --   --   LABPROT 15.4*  --   --   --   --   --   --   INR 1.2  --   --   --   --   --   --   HEPARINUNFRC  --   --   --   --   --   --  0.85*  CREATININE 2.50* 2.40*  --   --  2.55* 2.36*  --   TROPONINIHS  --   --  61* 62*  --   --   --    Assessment: 43 YOF who is hypotensive and borderline oxygen saturation now with elevated D-dimer concerning for pulmonary embolism. VQ scan ordered. In the meantime, pharmacy has been consulted to start IV heparin empirically to cover for r/o PE.   Hgb mildly low. Plt low. SCr elevated Of note patient received a dose of SQ heparin 5000 units at 1356 today.  Heparin level 0.85 units/ml. No bleeding reported  Goal of Therapy:  Heparin level 0.3-0.7 units/ml Monitor platelets by anticoagulation protocol: Yes   Plan:  -Decrease heparin infusion to 1350 units/hr -F/u 8 hr HL -Monitor daily HL, CBC and s/s of bleeding   Thanks for allowing pharmacy to be a part of this patient's care.  Excell Seltzer, PharmD Clinical Pharmacist

## 2020-08-09 NOTE — Progress Notes (Signed)
[place] for heparin Indication:  questionable clot in RA  Labs: Recent Labs    08/07/20 1241 08/07/20 1302 08/07/20 1344 08/07/20 1544 08/07/20 1735 08/08/20 0505 08/09/20 0038 08/09/20 0956 08/09/20 2111  HGB 11.6* 13.3  --   --  11.8*  --  11.6*  --   --   HCT 38.4 39.0  --   --  37.7  --  37.9  --   --   PLT 151  --   --   --  143*  --  148*  --   --   APTT 33  --   --   --   --   --   --   --   --   LABPROT 15.4*  --   --   --   --   --   --   --   --   INR 1.2  --   --   --   --   --   --   --   --   HEPARINUNFRC  --   --   --   --   --   --  0.85* 1.02* 0.80*  CREATININE 2.50* 2.40*  --   --  2.55* 2.36* 2.10*  --   --   TROPONINIHS  --   --  61* 62*  --   --   --   --   --    Assessment: 24 YOF who is hypotensive and borderline oxygen saturation now with elevated D-dimer concerning for pulmonary embolism. VQ scan ordered. In the meantime, pharmacy has been consulted to start IV heparin.  Dopplers and VQ scan negative, but pt with AFib and also concern for RA clot on ECHO. Heparin level remains slightly above goal at 0.8  Goal of Therapy:  Heparin level 0.3-0.7 units/ml Monitor platelets by anticoagulation protocol: Yes   Plan:  -Reduce heparin to 1000 units/h -Repeat heparin level with am labs  Arrie Senate, PharmD, BCPS, [facility] Clinical Pharmacist [phone] Please check AMION for all [place] numbers 08/09/2020

## 2020-08-09 NOTE — Progress Notes (Signed)
At 0252 pt had 34-beat run of VT. Pt sleeping at the time-easily aroused. BP 96/54 with O2 sat 97 on 4L/[place]. Pt denies chest pain or any other complaints at this time. Will page MD on call to inform of the above and continue to monitor closely. Jessie Foot, RN

## 2020-08-09 NOTE — H&P (View-Only) (Signed)
Ref: Jon Billings, NP   Subjective:  Awake.  Sustained VT event 6-7 hours ago. Decreasing confusion. Monitor v-paced with VPCs in bigeminy. Needs ICD interrogation and if not optimal needs EP consult. No fever, no leukocytosis. Questionable calcium deposit v/s endocarditis over ICD wire in RA.  Objective:  Vital Signs in the last 24 hours: Temp:  [97.5 F (36.4 C)-97.7 F (36.5 C)] 97.7 F (36.5 C) (08/08 0743) Pulse Rate:  [90-114] 90 (08/08 0743) Cardiac Rhythm: Ventricular paced (08/08 0846) Resp:  [15-22] 16 (08/08 0743) BP: (95-100)/(49-60) 100/55 (08/08 0743) SpO2:  [91 %-97 %] 92 % (08/08 0743) Weight:  [88.5 kg] 88.5 kg (08/08 0305)  Physical Exam: BP Readings from Last 1 Encounters:  08/09/20 (!) 100/55     Wt Readings from Last 1 Encounters:  08/09/20 88.5 kg    Weight change:  Body mass index is 34.54 kg/m. HEENT: [place]/AT, Eyes-Hazel, Conjunctiva-Pink, Sclera-Non-icteric Neck: No JVD, No bruit, Trachea midline. Lungs:  Clearing, Bilateral. Cardiac:  Regular paced rhythm, normal S1 and S2, no S3. II/VI systolic murmur. Abdomen:  Soft, non-tender. BS present. Extremities:  No edema present. No cyanosis. No clubbing. CNS: AxOx3, Cranial nerves grossly intact, moves all 4 extremities.  Skin: Warm and dry.   Intake/Output from previous day: 08/07 0701 - 08/08 0700 In: 147.8 [I.V.:147.8] Out: 351 [Urine:351]    Lab Results: BMET    Component Value Date/Time   NA 137 08/09/2020 0038   NA 138 08/08/2020 0505   NA 136 08/07/2020 1302   NA 143 05/13/2020 1314   NA 144 05/03/2020 1334   NA 141 03/10/2020 1536   NA 139 12/07/2013 1207   NA 141 09/21/2013 1133   NA 137 04/22/2013 1032   K 4.8 08/09/2020 0038   K 4.7 08/08/2020 0505   K 5.0 08/07/2020 1302   K 3.8 12/07/2013 1207   K 4.1 09/21/2013 1133   K 3.7 04/22/2013 1032   CL 99 08/09/2020 0038   CL 100 08/08/2020 0505   CL 99 08/07/2020 1302   CL 99 12/07/2013 1207   CL 104 09/21/2013  1133   CL 102 04/22/2013 1032   CO2 28 08/09/2020 0038   CO2 28 08/08/2020 0505   CO2 27 08/07/2020 1241   CO2 31 12/07/2013 1207   CO2 32 09/21/2013 1133   CO2 31 04/22/2013 1032   GLUCOSE 115 (H) 08/09/2020 0038   GLUCOSE 101 (H) 08/08/2020 0505   GLUCOSE 113 (H) 08/07/2020 1302   GLUCOSE 97 12/07/2013 1207   GLUCOSE 97 09/21/2013 1133   GLUCOSE 143 (H) 04/22/2013 1032   BUN 58 (H) 08/09/2020 0038   BUN 61 (H) 08/08/2020 0505   BUN 57 (H) 08/07/2020 1302   BUN 27 05/13/2020 1314   BUN 35 (H) 05/03/2020 1334   BUN 29 (H) 03/10/2020 1536   BUN 8 12/07/2013 1207   BUN 15 09/21/2013 1133   BUN 15 04/22/2013 1032   CREATININE 2.10 (H) 08/09/2020 0038   CREATININE 2.36 (H) 08/08/2020 0505   CREATININE 2.55 (H) 08/07/2020 1735   CREATININE 0.77 12/07/2013 1207   CREATININE 0.79 09/21/2013 1133   CREATININE 0.76 04/22/2013 1032   CALCIUM 9.2 08/09/2020 0038   CALCIUM 9.0 08/08/2020 0505   CALCIUM 9.2 08/07/2020 1241   CALCIUM 8.7 12/07/2013 1207   CALCIUM 9.2 09/21/2013 1133   CALCIUM 8.9 04/22/2013 1032   GFRNONAA 25 (L) 08/09/2020 0038   GFRNONAA 22 (L) 08/08/2020 0505   GFRNONAA 20 (L) 08/07/2020 1735  GFRNONAA >60 12/07/2013 1207   GFRNONAA >60 09/21/2013 1133   GFRNONAA >60 04/22/2013 1032   GFRNONAA >60 02/23/2013 0506   GFRAA 41 (L) 09/04/2019 1114   GFRAA 58 (L) 03/28/2019 1336   GFRAA 55 (L) 03/19/2019 1509   GFRAA >60 12/07/2013 1207   GFRAA >60 09/21/2013 1133   GFRAA >60 04/22/2013 1032   GFRAA >60 02/23/2013 0506   CBC    Component Value Date/Time   WBC 5.8 08/09/2020 0038   RBC 4.14 08/09/2020 0038   HGB 11.6 (L) 08/09/2020 0038   HGB 13.6 09/05/2018 1401   HCT 37.9 08/09/2020 0038   HCT 40.4 09/05/2018 1401   PLT 148 (L) 08/09/2020 0038   PLT 178 09/05/2018 1401   MCV 91.5 08/09/2020 0038   MCV 88 09/05/2018 1401   MCV 91 12/07/2013 1207   MCH 28.0 08/09/2020 0038   MCHC 30.6 08/09/2020 0038   RDW 17.0 (H) 08/09/2020 0038   RDW 15.6 (H)  09/05/2018 1401   RDW 14.6 (H) 12/07/2013 1207   LYMPHSABS 1.1 08/07/2020 1241   LYMPHSABS 2.7 09/05/2018 1401   LYMPHSABS 1.0 02/23/2013 0506   MONOABS 0.6 08/07/2020 1241   MONOABS 0.1 (L) 02/23/2013 0506   EOSABS 0.1 08/07/2020 1241   EOSABS 0.0 02/23/2013 0506   BASOSABS 0.0 08/07/2020 1241   BASOSABS 0.0 02/23/2013 0506   HEPATIC Function Panel Recent Labs    05/03/20 1334 05/13/20 1314 08/07/20 1241  PROT 7.2 6.9 6.7   HEMOGLOBIN A1C No components found for: HGA1C,  MPG CARDIAC ENZYMES Lab Results  Component Value Date   TROPONINI <0.03 09/02/2014   TROPONINI <0.03 09/02/2014   TROPONINI <0.03 05/07/2014   BNP No results for input(s): PROBNP in the last 8760 hours. TSH No results for input(s): TSH in the last 8760 hours. CHOLESTEROL Recent Labs    03/10/20 1536  CHOL 202*    Scheduled Meds:  fluticasone  2 spray Each Nare Daily   gabapentin  100 mg Oral TID   loratadine  10 mg Oral Daily   pantoprazole  40 mg Oral Daily   sodium chloride flush  3 mL Intravenous Once   sodium chloride flush  3 mL Intravenous Q12H   spironolactone  12.5 mg Oral Daily   vancomycin variable dose per unstable renal function (pharmacist dosing)   Does not apply See admin instructions   Continuous Infusions:  sodium chloride     sodium chloride     heparin 1,350 Units/hr (08/09/20 0204)   PRN Meds:.sodium chloride, acetaminophen, albuterol, benzonatate, diclofenac Sodium, ondansetron (ZOFRAN) IV, sodium chloride flush, triamcinolone  Assessment/Plan:  Acute on chronic systolic left heart failure, HFrEF Sustained VT Dilated cardiomyopathy S/P ICD r/o infection CAD HTN COPD S/P COVID infection Acute renal failure Severe MR Severe TR Mild AI and PI  Plan: EP consult Discuss with family TEE. Discuss Hospice care if not in place.    LOS: 2 days   Time spent including chart review, lab review, examination, discussion with patient :  min   Dixie Dials  MD   08/09/2020, 8:54 AM

## 2020-08-10 ENCOUNTER — Inpatient Hospital Stay (HOSPITAL_COMMUNITY): Payer: Medicare Other

## 2020-08-10 ENCOUNTER — Inpatient Hospital Stay (HOSPITAL_COMMUNITY): Payer: Medicare Other | Admitting: Anesthesiology

## 2020-08-10 ENCOUNTER — Other Ambulatory Visit (HOSPITAL_COMMUNITY): Payer: Self-pay

## 2020-08-10 ENCOUNTER — Encounter (HOSPITAL_COMMUNITY)
Admission: EM | Disposition: A | Discharge status: Home Health | Payer: Self-pay | Source: Home / Self Care | Attending: Cardiovascular Disease

## 2020-08-10 ENCOUNTER — Encounter (HOSPITAL_COMMUNITY): Payer: Self-pay | Admitting: Cardiovascular Disease

## 2020-08-10 ENCOUNTER — Other Ambulatory Visit: Payer: Self-pay

## 2020-08-10 DIAGNOSIS — I472 Ventricular tachycardia: Secondary | ICD-10-CM | POA: Diagnosis not present

## 2020-08-10 DIAGNOSIS — I340 Nonrheumatic mitral (valve) insufficiency: Secondary | ICD-10-CM

## 2020-08-10 DIAGNOSIS — I071 Rheumatic tricuspid insufficiency: Secondary | ICD-10-CM

## 2020-08-10 DIAGNOSIS — I4819 Other persistent atrial fibrillation: Secondary | ICD-10-CM | POA: Diagnosis not present

## 2020-08-10 DIAGNOSIS — I5021 Acute systolic (congestive) heart failure: Secondary | ICD-10-CM | POA: Diagnosis not present

## 2020-08-10 HISTORY — PX: TEE WITHOUT CARDIOVERSION: SHX5443

## 2020-08-10 LAB — BASIC METABOLIC PANEL
Anion gap: 10 (ref 5–15)
BUN: 55 mg/dL — ABNORMAL HIGH (ref 8–23)
CO2: 29 mmol/L (ref 22–32)
Calcium: 9 mg/dL (ref 8.9–10.3)
Chloride: 97 mmol/L — ABNORMAL LOW (ref 98–111)
Creatinine, Ser: 2.09 mg/dL — ABNORMAL HIGH (ref 0.44–1.00)
GFR, Estimated: 25 mL/min — ABNORMAL LOW (ref >60)
Glucose, Bld: 137 mg/dL — ABNORMAL HIGH (ref 70–99)
Potassium: 4.2 mmol/L (ref 3.5–5.1)
Sodium: 136 mmol/L (ref 135–145)

## 2020-08-10 LAB — CBC
HCT: 38 % (ref 36.0–46.0)
Hemoglobin: 11.7 g/dL — ABNORMAL LOW (ref 12.0–15.0)
MCH: 28.1 pg (ref 26.0–34.0)
MCHC: 30.8 g/dL (ref 30.0–36.0)
MCV: 91.1 fL (ref 80.0–100.0)
Platelets: 151 10*3/uL (ref 150–400)
RBC: 4.17 MIL/uL (ref 3.87–5.11)
RDW: 17.2 % — ABNORMAL HIGH (ref 11.5–15.5)
WBC: 5.2 10*3/uL (ref 4.0–10.5)
nRBC: 1.1 % — ABNORMAL HIGH (ref 0.0–0.2)

## 2020-08-10 LAB — HEPARIN LEVEL (UNFRACTIONATED): Heparin Unfractionated: 0.55 IU/mL (ref 0.30–0.70)

## 2020-08-10 LAB — PROCALCITONIN: Procalcitonin: 0.16 ng/mL

## 2020-08-10 SURGERY — ECHOCARDIOGRAM, TRANSESOPHAGEAL
Anesthesia: General

## 2020-08-10 MED ORDER — DIGOXIN 0.25 MG/ML IJ SOLN
0.2500 mg | Freq: Every day | INTRAMUSCULAR | Status: AC
  Administered 2020-08-10: 0.25 mg via INTRAVENOUS
  Filled 2020-08-10 (×3): qty 1

## 2020-08-10 MED ORDER — LIDOCAINE 2% (20 MG/ML) 5 ML SYRINGE
INTRAMUSCULAR | Status: DC | PRN
  Administered 2020-08-10: 40 mg via INTRAVENOUS

## 2020-08-10 MED ORDER — APIXABAN 5 MG PO TABS
5.0000 mg | ORAL_TABLET | Freq: Two times a day (BID) | ORAL | Status: DC
  Administered 2020-08-10 – 2020-08-13 (×7): 5 mg via ORAL
  Filled 2020-08-10 (×8): qty 1

## 2020-08-10 MED ORDER — PROPOFOL 10 MG/ML IV BOLUS
INTRAVENOUS | Status: DC | PRN
  Administered 2020-08-10: 10 mg via INTRAVENOUS
  Administered 2020-08-10: 20 mg via INTRAVENOUS

## 2020-08-10 MED ORDER — SODIUM CHLORIDE 0.9 % IV SOLN: INTRAVENOUS | Status: DC

## 2020-08-10 NOTE — Interval H&P Note (Signed)
History and Physical Interval Note:  08/10/2020 1:08 PM  Maria Neal  has presented today for surgery, with the diagnosis of afib.  The various methods of treatment have been discussed with the patient and family. After consideration of risks, benefits and other options for treatment, the patient has consented to  Procedure(s): TRANSESOPHAGEAL ECHOCARDIOGRAM (TEE) (N/A) CARDIOVERSION (N/A) as a surgical intervention.  The patient's history has been reviewed, patient examined, no change in status, stable for surgery.  I have reviewed the patient's chart and labs.  Questions were answered to the patient's satisfaction.     Birdie Riddle

## 2020-08-10 NOTE — Progress Notes (Addendum)
[place] for heparin> apixaban Indication:  questionable clot in RA, afib  Labs: Recent Labs    08/07/20 1544 08/07/20 1735 08/07/20 1735 08/08/20 0505 08/09/20 0038 08/09/20 0956 08/09/20 2111 08/10/20 0203  HGB  --  11.8*   < >  --  11.6*  --   --  11.7*  HCT  --  37.7  --   --  37.9  --   --  38.0  PLT  --  143*  --   --  148*  --   --  151  HEPARINUNFRC  --   --    < >  --  0.85* 1.02* 0.80* 0.55  CREATININE  --  2.55*   < > 2.36* 2.10*  --   --  2.09*  TROPONINIHS 62*  --   --   --   --   --   --   --    < > = values in this interval not displayed.   Assessment: 76 YOF who is hypotensive and borderline oxygen saturation now with elevated D-dimer concerning for pulmonary embolism. VQ scan ordered. In the meantime, pharmacy has been consulted to start IV heparin.  Dopplers and VQ scan negative, but pt with AFib and also concern for RA clot on ECHO. Pharmacy to change from heparin to apixaban -apixban cost: $9.85 for 30 days  Goal of Therapy:  Heparin level 0.3-0.7 units/ml Monitor platelets by anticoagulation protocol: Yes   Plan:  -Stop heparin  -begin apixaban 5mg  po bid  Hildred Laser, PharmD Clinical Pharmacist **Pharmacist phone directory can now be found on [email or web address] ([email or web address]).  Listed under [place].

## 2020-08-10 NOTE — Progress Notes (Addendum)
  Echocardiogram Transesophageal echocardiogram has been performed.  Maria Neal 08/10/2020, 1:13 PM

## 2020-08-10 NOTE — Transfer of Care (Signed)
Immediate Anesthesia Transfer of Care Note  Patient: Maria Neal  Procedure(s) Performed: TRANSESOPHAGEAL ECHOCARDIOGRAM (TEE) CARDIOVERSION  Patient Location: PACU and Endoscopy Unit  Anesthesia Type:MAC  Level of Consciousness: drowsy and patient cooperative  Airway & Oxygen Therapy: Patient Spontanous Breathing and Patient connected to face mask oxygen  Post-op Assessment: Report given to RN and Post -op Vital signs reviewed and stable  Post vital signs: Reviewed and stable  Last Vitals:  Vitals Value Taken Time  BP    Temp    Pulse    Resp    SpO2      Last Pain:  Vitals:   08/10/20 1101  TempSrc: Oral  PainSc: 0-No pain         Complications: No notable events documented.

## 2020-08-10 NOTE — Anesthesia Postprocedure Evaluation (Addendum)
Anesthesia Post Note  Patient: Maria Neal  Procedure(s) Performed: TRANSESOPHAGEAL ECHOCARDIOGRAM (TEE) CARDIOVERSION     Patient location during evaluation: Endoscopy Anesthesia Type: General Level of consciousness: awake and alert Pain management: pain level controlled Vital Signs Assessment: post-procedure vital signs reviewed and stable Respiratory status: spontaneous breathing, nonlabored ventilation, respiratory function stable and patient connected to nasal cannula oxygen Cardiovascular status: blood pressure returned to baseline and stable Postop Assessment: no apparent nausea or vomiting Anesthetic complications: no   No notable events documented.  Last Vitals:  Vitals:   08/10/20 1318 08/10/20 1328  BP: (!) 109/39 (!) 113/52  Pulse: (!) 49 86  Resp: 17 14  Temp:    SpO2: 98% 94%    Last Pain:  Vitals:   08/10/20 1328  TempSrc:   PainSc: 0-No pain                 Thersia Petraglia L Tyrann Donaho

## 2020-08-10 NOTE — Anesthesia Procedure Notes (Signed)
Procedure Name: MAC Date/Time: 08/10/2020 1:00 PM Performed by: Renato Shin, CRNA Pre-anesthesia Checklist: Patient identified, Emergency Drugs available, Suction available and Patient being monitored Patient Re-evaluated:Patient Re-evaluated prior to induction Oxygen Delivery Method: Simple face mask Preoxygenation: Pre-oxygenation with 100% oxygen Induction Type: IV induction Airway Equipment and Method: Bite block Placement Confirmation: positive ETCO2 and breath sounds checked- equal and bilateral Dental Injury: Teeth and Oropharynx as per pre-operative assessment

## 2020-08-10 NOTE — CV Procedure (Signed)
INDICATIONS:   The patient is 70 years old female with chronic atrial fibrillation, CHF and COPD.  PROCEDURE:  Informed consent was discussed including risks, benefits and alternatives for the procedure.  Risks include, but are not limited to, cough, sore throat, vomiting, nausea, somnolence, esophageal and stomach trauma or perforation, bleeding, low blood pressure, aspiration, pneumonia, infection, trauma to the teeth and death.    Patient was given sedation- 30 mg. Propofol. The oropharynx was anesthetized with topical lidocaine.  The transesophageal probe was inserted in the esophagus and stomach and multiple views were obtained.  The study was incomplete due to hypoxia under sedation The patient was kept under observation until the patient left the procedure room.  The patient left the procedure room in stable condition.   COMPLICATIONS:  There were no immediate complications.  FINDINGS:  LEFT VENTRICLE: The left ventricle is dilated and has severe global hypokinesia. No thrombus or masses seen in the left ventricle. Short axis views not done due to withdrawal of the probe for patient safety.  RIGHT VENTRICLE:  The right ventricle is dilated and moderately hypokinetic without any thrombus or masses.    LEFT ATRIUM:  The left atrium is normal without any thrombus or masses.  LEFT ATRIAL APPENDAGE:  The left atrial appendage is free of any thrombus or masses.  RIGHT ATRIUM:  The right atrium has thrombus and thrombus or vegetation attached to pacer wire.    ATRIAL SEPTUM:  The atrial septum is normal without any ASD.  MITRAL VALVE:  The mitral valve is degenerative with moderate MR. No masses, stenosis or vegetations.  TRICUSPID VALVE:  The tricuspid valve is normal in structure and function with severe regurgitation.no masses, stenosis or vegetations.  AORTIC VALVE:  The aortic valve is tricuspid and calcific with mild regurgitation, no masses, stenosis or vegetations.  PULMONIC  VALVE:  The pulmonic valve is normal in structure and function with mild regurgitation, no masses, stenosis or vegetations.  AORTIC ARCH, ASCENDING AND DESCENDING AORTA:  The aorta had no mild to moderate atherosclerosis of aortic root and limited view of the ascending aorta.   Superior Vena Cava : catheter/Pacer wire seen.   Pulmonary Veins: Not seen   Pulmonary artery: not well seen.   IMPRESSION:   Limited study due to patient having hypoxia. Poor LV systolic function. EF 20-25 %. Severe TR. MR, AI, PI. Small thrombus or vegetation on ICD wire in RA. No definite LA thrombus.  RECOMMENDATIONS:    Long term anticoagulation and bring her back for elective external cardioversion in 4 -5 weeks. Blood cultures x 2. EP consult and evaluation.

## 2020-08-10 NOTE — Progress Notes (Signed)
Progress Note  Patient Name: Maria Neal Date of Encounter: 08/10/2020  [facility] Cardiologist: None   Subjective   NAEO. TEE Planned for today.  Inpatient Medications    Scheduled Meds:  fluticasone  2 spray Each Nare Daily   gabapentin  100 mg Oral TID   loratadine  10 mg Oral Daily   pantoprazole  40 mg Oral Daily   sodium chloride flush  3 mL Intravenous Once   sodium chloride flush  3 mL Intravenous Q12H   spironolactone  12.5 mg Oral Daily   Continuous Infusions:  sodium chloride     sodium chloride     heparin 1,000 Units/hr (08/09/20 2323)   PRN Meds: sodium chloride, acetaminophen, albuterol, benzonatate, diclofenac Sodium, ondansetron (ZOFRAN) IV, sodium chloride flush, triamcinolone   Vital Signs    Vitals:   08/09/20 1525 08/09/20 1922 08/10/20 0012 08/10/20 0347  BP: 108/60 (!) 88/49 96/68 (!) 99/51  Pulse: 91 87 73   Resp:  16 18 18   Temp: 98.8 F (37.1 C) (!) 97.5 F (36.4 C) 97.8 F (36.6 C) 98 F (36.7 C)  TempSrc: Oral Oral Oral Oral  SpO2: 91% 93% 91% 96%  Weight:    87.9 kg  Height:        Intake/Output Summary (Last 24 hours) at 08/10/2020 0647 Last data filed at 08/09/2020 2220 Gross per 24 hour  Intake 933.97 ml  Output 150 ml  Net 783.97 ml   Last 3 Weights 08/10/2020 08/09/2020 06/25/2020  Weight (lbs) 193 lb 12.6 oz 195 lb 187 lb 2 oz  Weight (kg) 87.9 kg 88.451 kg 84.879 kg      Telemetry    Frequent PVCs. NSVT this morning. - Personally Reviewed  ECG    Personally Reviewed  Physical Exam   GEN: No acute distress.   Neck: No JVD Cardiac: irregularly irregular, no murmurs, rubs, or gallops.  Respiratory: Clear to auscultation bilaterally. GI: Soft, nontender, non-distended  Maria: 1+ pitting bilateral LE edema; No deformity. Neuro:  Nonfocal.  Psych: Normal affect   Labs    High Sensitivity Troponin:   Recent Labs  Lab 08/07/20 1344 08/07/20 1544  TROPONINIHS 61* 62*      Chemistry Recent Labs  Lab  08/07/20 1241 08/07/20 1302 08/08/20 0505 08/09/20 0038 08/10/20 0203  NA 136   < > 138 137 136  K 5.1   < > 4.7 4.8 4.2  CL 99   < > 100 99 97*  CO2 27  --  28 28 29   GLUCOSE 112*   < > 101* 115* 137*  BUN 63*   < > 61* 58* 55*  CREATININE 2.50*   < > 2.36* 2.10* 2.09*  CALCIUM 9.2  --  9.0 9.2 9.0  PROT 6.7  --   --   --   --   ALBUMIN 3.3*  --   --   --   --   AST 16  --   --   --   --   ALT 10  --   --   --   --   ALKPHOS 57  --   --   --   --   BILITOT 1.4*  --   --   --   --   GFRNONAA 20*   < > 22* 25* 25*  ANIONGAP 10  --  10 10 10    < > = values in this interval not displayed.     Hematology  Recent Labs  Lab 08/07/20 1735 08/09/20 0038 08/10/20 0203  WBC 4.5 5.8 5.2  RBC 4.15 4.14 4.17  HGB 11.8* 11.6* 11.7*  HCT 37.7 37.9 38.0  MCV 90.8 91.5 91.1  MCH 28.4 28.0 28.1  MCHC 31.3 30.6 30.8  RDW 16.7* 17.0* 17.2*  PLT 143* 148* 151    BNP Recent Labs  Lab 08/07/20 1344  BNP 1,647.8*     DDimer  Recent Labs  Lab 08/08/20 1335  DDIMER 1.21*     Radiology    NM Pulmonary Perfusion  Result Date: 08/09/2020 CLINICAL DATA:  Pulmonary emboli suspected, high probability, shortness of breath. EXAM: NUCLEAR MEDICINE PERFUSION LUNG SCAN TECHNIQUE: Perfusion images were obtained in multiple projections after intravenous injection of radiopharmaceutical. Ventilation scans intentionally deferred if perfusion scan and chest x-ray adequate for interpretation during COVID 19 epidemic. RADIOPHARMACEUTICALS:  4.3 mCi Tc-30m MAA IV COMPARISON:  Chest radiography 2 days ago. FINDINGS: No perfusion defect. Photopenia related in some projections to overlying pacemaker. Large cardiac defect. IMPRESSION: No perfusion defects to suggest pulmonary emboli. Enlarged heart. Photopenia on some projections related to the pacemaker battery. Electronically Signed   By: Nelson Chimes M.D.   On: 08/09/2020 11:42   DG Abd 2 Views  Result Date: 08/09/2020 CLINICAL DATA:  Abdominal for  several months, initial encounter EXAM: ABDOMEN - 2 VIEW COMPARISON:  None. FINDINGS: Scattered large and small bowel gas is noted. No obstructive changes are seen. No significant constipation is noted. Postsurgical changes are seen. No free air is noted. Small right renal calculus is noted. No acute bony abnormality is noted. IMPRESSION: Nonobstructing right renal stone. No other focal abnormality is noted. Electronically Signed   By: Inez Catalina M.D.   On: 08/09/2020 20:41   ECHOCARDIOGRAM COMPLETE  Result Date: 08/08/2020    ECHOCARDIOGRAM REPORT   Patient Name:   Maria Neal Date of Exam: 08/08/2020 Medical Rec #:  [id number]    Height:       63.0 in Accession #:    [id number]   Weight:       187.1 lb Date of Birth:  July 19, 1950    BSA:          1.880 m Patient Age:    70 years     BP:           97/57 mmHg Patient Gender: F            HR:           94 bpm. Exam Location:  Inpatient Procedure: 2D Echo, Cardiac Doppler and Color Doppler Indications:    CHF-Acute Systolic  History:        Patient has no prior history of Echocardiogram examinations.                 Cardiomyopathy, CAD, Defibrillator; Risk Factors:Hypertension.  Sonographer:    Clayton Lefort RDCS (AE) Referring Phys: [place]  1. Left ventricular ejection fraction, by estimation, is 20 to 25%. The left ventricle has severely decreased function. The left ventricle demonstrates global hypokinesis. The left ventricular internal cavity size was severely dilated. Left ventricular diastolic parameters are indeterminate.  2. Right ventricular systolic function is severely reduced. The right ventricular size is moderately enlarged. There is severely elevated pulmonary artery systolic pressure.  3. Left atrial size was severely dilated.  4. Right atrial size was severely dilated.  5. The mitral valve is degenerative. Moderate to severe mitral valve regurgitation. Moderate mitral annular calcification.  6. Tricuspid valve regurgitation is  severe.  7. The aortic valve is tricuspid. There is moderate calcification of the aortic valve. There is mild thickening of the aortic valve. Aortic valve regurgitation is mild to moderate. Mild to moderate aortic valve sclerosis/calcification is present, without any evidence of aortic stenosis.  8. There is mild (Grade II) atheroma plaque involving the aortic root and ascending aorta.  9. The inferior vena cava is dilated in size with <50% respiratory variability, suggesting right atrial pressure of 15 mmHg. FINDINGS  Left Ventricle: Left ventricular ejection fraction, by estimation, is 20 to 25%. The left ventricle has severely decreased function. The left ventricle demonstrates global hypokinesis. The left ventricular internal cavity size was severely dilated. There is borderline asymmetric left ventricular hypertrophy of the posterior segment. Left ventricular diastolic parameters are indeterminate. Right Ventricle: The right ventricular size is moderately enlarged. No increase in right ventricular wall thickness. Right ventricular systolic function is severely reduced. There is severely elevated pulmonary artery systolic pressure. The tricuspid regurgitant velocity is 3.56 m/s, and with an assumed right atrial pressure of 15 mmHg, the estimated right ventricular systolic pressure is 01.0 mmHg. Left Atrium: Left atrial size was severely dilated. Right Atrium: Right atrial size was severely dilated. Pericardium: There is no evidence of pericardial effusion. Mitral Valve: The mitral valve is degenerative in appearance. There is mild thickening of the mitral valve leaflet(s). There is moderate calcification of the mitral valve leaflet(s). Moderate mitral annular calcification. Moderate to severe mitral valve regurgitation. Tricuspid Valve: The tricuspid valve is grossly normal. Tricuspid valve regurgitation is severe. Aortic Valve: The aortic valve is tricuspid. There is moderate calcification of the aortic valve.  There is mild thickening of the aortic valve. There is moderate to severe aortic valve annular calcification. Aortic valve regurgitation is mild to moderate. Aortic regurgitation PHT measures 472 msec. Mild to moderate aortic valve sclerosis/calcification is present, without any evidence of aortic stenosis. Aortic valve mean gradient measures 3.0 mmHg. Aortic valve peak gradient measures 6.4 mmHg. Aortic valve area, by VTI measures 1.26 cm. Pulmonic Valve: The pulmonic valve was normal in structure. Pulmonic valve regurgitation is not visualized. Aorta: The aortic root is normal in size and structure. There is mild (Grade II) atheroma plaque involving the aortic root and ascending aorta. Venous: The inferior vena cava is dilated in size with less than 50% respiratory variability, suggesting right atrial pressure of 15 mmHg. IAS/Shunts: The atrial septum is grossly normal. Additional Comments: A device lead is visualized in the right atrium and right ventricle.  LEFT VENTRICLE PLAX 2D LVIDd:         7.00 cm LVIDs:         6.40 cm LV PW:         1.50 cm LV IVS:        0.60 cm LVOT diam:     1.90 cm LV SV:         25 LV SV Index:   13 LVOT Area:     2.84 cm  RIGHT VENTRICLE             IVC RV Basal diam:  4.60 cm     IVC diam: 3.10 cm RV S prime:     11.60 cm/s TAPSE (M-mode): 2.1 cm LEFT ATRIUM              Index       RIGHT ATRIUM           Index LA diam:  5.80 cm  3.09 cm/m  RA Area:     26.20 cm LA Vol (A2C):   122.0 ml 64.90 ml/m RA Volume:   100.00 ml 53.20 ml/m LA Vol (A4C):   152.0 ml 80.86 ml/m LA Biplane Vol: 138.0 ml 73.42 ml/m  AORTIC VALVE AV Area (Vmax):    1.18 cm AV Area (Vmean):   1.12 cm AV Area (VTI):     1.26 cm AV Vmax:           126.00 cm/s AV Vmean:          77.500 cm/s AV VTI:            0.201 m AV Peak Grad:      6.4 mmHg AV Mean Grad:      3.0 mmHg LVOT Vmax:         52.50 cm/s LVOT Vmean:        30.700 cm/s LVOT VTI:          0.089 m LVOT/AV VTI ratio: 0.44 AI PHT:             472 msec  AORTA Ao Root diam: 3.20 cm Ao Asc diam:  3.10 cm MR Peak grad:    89.9 mmHg   TRICUSPID VALVE MR Mean grad:    51.0 mmHg   TR Peak grad:   50.7 mmHg MR Vmax:         474.00 cm/s TR Vmax:        356.00 cm/s MR Vmean:        328.0 cm/s MR PISA:         5.09 cm    SHUNTS MR PISA Eff ROA: 41 mm      Systemic VTI:  0.09 m MR PISA Radius:  0.90 cm     Systemic Diam: 1.90 cm Dixie Dials MD Electronically signed by Dixie Dials MD Signature Date/Time: 08/08/2020/5:28:31 PM    Final    VAS [place] LOWER EXTREMITY VENOUS (DVT)  Result Date: 08/09/2020  Lower Venous DVT Study Patient Name:  Maria Neal  Date of Exam:   08/08/2020 Medical Rec #: [id number]     Accession #:    [id number] Date of Birth: [birth year]     Patient Gender: F Patient Age:   19 years Exam Location:  [facility] Procedure:      VAS [place] LOWER EXTREMITY VENOUS (DVT) Referring Phys: Dixie Dials --------------------------------------------------------------------------------  Indications: Edema.  Limitations: Body habitus and poor ultrasound/tissue interface. Comparison Study: No previous exams Performing Technologist: Jody Hill RVT, RDMS  Examination Guidelines: A complete evaluation includes B-mode imaging, spectral Doppler, color Doppler, and power Doppler as needed of all accessible portions of each vessel. Bilateral testing is considered an integral part of a complete examination. Limited examinations for reoccurring indications may be performed as noted. The reflux portion of the exam is performed with the patient in reverse Trendelenburg.  +---------+---------------+---------+-----------+----------+--------------+ RIGHT    CompressibilityPhasicitySpontaneityPropertiesThrombus Aging +---------+---------------+---------+-----------+----------+--------------+ CFV      Full           Yes      Yes                                 +---------+---------------+---------+-----------+----------+--------------+ SFJ      Full                                                         +---------+---------------+---------+-----------+----------+--------------+  FV Prox  Full           Yes      Yes                                 +---------+---------------+---------+-----------+----------+--------------+ FV Mid   Full           Yes      Yes                                 +---------+---------------+---------+-----------+----------+--------------+ FV DistalFull           Yes      Yes                                 +---------+---------------+---------+-----------+----------+--------------+ PFV      Full                                                        +---------+---------------+---------+-----------+----------+--------------+ POP      Full           Yes      Yes                                 +---------+---------------+---------+-----------+----------+--------------+ PTV      Full                                                        +---------+---------------+---------+-----------+----------+--------------+ PERO     Full                                                        +---------+---------------+---------+-----------+----------+--------------+   +---------+---------------+---------+-----------+----------+--------------+ LEFT     CompressibilityPhasicitySpontaneityPropertiesThrombus Aging +---------+---------------+---------+-----------+----------+--------------+ CFV      Full           Yes      Yes                                 +---------+---------------+---------+-----------+----------+--------------+ SFJ      Full                                                        +---------+---------------+---------+-----------+----------+--------------+ FV Prox  Full           Yes      Yes                                 +---------+---------------+---------+-----------+----------+--------------+ FV Mid   Full  Yes      Yes                                  +---------+---------------+---------+-----------+----------+--------------+ FV DistalFull           Yes      Yes                                 +---------+---------------+---------+-----------+----------+--------------+ PFV      Full                                                        +---------+---------------+---------+-----------+----------+--------------+ POP      Full           Yes      Yes                                 +---------+---------------+---------+-----------+----------+--------------+ PTV      Full                                                        +---------+---------------+---------+-----------+----------+--------------+ PERO     Full                                                        +---------+---------------+---------+-----------+----------+--------------+     Summary: BILATERAL: - No evidence of deep vein thrombosis seen in the lower extremities, bilaterally. - No evidence of superficial venous thrombosis in the lower extremities, bilaterally. -No evidence of popliteal cyst, bilaterally.   *See table(s) above for measurements and observations. Electronically signed by Monica Martinez MD on 08/09/2020 at 4:26:35 PM.    Final     Cardiac Studies   No new   Assessment & Plan    Maria Neal is a pleasant 70 year old woman with recent COVID infection who was admitted with acute on chronic combined systolic and diastolic heart failure.  She also has a history of left bundle branch block, hypertension, hyperlipidemia, COPD and venous insufficiency.  She is currently under the care of Dr. Doylene Canard.  I initially consulted on Maria Neal 08/09/2020 for VT.  #WCT/VT NSVT this morning appears ventricular. 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 TEE today. If no LAA thrombus, start amiodarone gtt for at least 24 hours followed by oral amiodarone regimen. Heparin gtt currently ordered NOAC at time of discharge  #Acute on  chronic combined systolic/diastolic heart failure #Severe MR/TR TEE today  For questions or updates, please contact [place] Please consult [email or web address] for contact info under        Signed, Vickie Epley, MD  08/10/2020, 6:47 AM

## 2020-08-10 NOTE — Anesthesia Preprocedure Evaluation (Addendum)
Anesthesia Evaluation  Patient identified by MRN, date of birth, ID band Patient awake    Reviewed: Allergy & Precautions, NPO status , Patient's Chart, lab work & pertinent test results  Airway Mallampati: I  TM Distance: >3 FB Neck ROM: Full    Dental  (+) Edentulous Upper, Edentulous Lower, Dental Advisory Given   Pulmonary shortness of breath, COPD (3L O2),  oxygen dependent, former smoker,    Pulmonary exam normal breath sounds clear to auscultation       Cardiovascular hypertension, + CAD and +CHF  Normal cardiovascular exam+ dysrhythmias Supra Ventricular Tachycardia + Cardiac Defibrillator + Valvular Problems/Murmurs (mod/severe MR, mild/mod AI) MR  Rhythm:Irregular Rate:Normal  TTE 2022 1. Left ventricular ejection fraction, by estimation, is 20 to 25%. The  left ventricle has severely decreased function. The left ventricle  demonstrates global hypokinesis. The left ventricular internal cavity size  was severely dilated. Left ventricular  diastolic parameters are indeterminate.  2. Right ventricular systolic function is severely reduced. The right  ventricular size is moderately enlarged. There is severely elevated  pulmonary artery systolic pressure.  3. Left atrial size was severely dilated.  4. Right atrial size was severely dilated.  5. The mitral valve is degenerative. Moderate to severe mitral valve  regurgitation. Moderate mitral annular calcification.  6. Tricuspid valve regurgitation is severe.  7. The aortic valve is tricuspid. There is moderate calcification of the  aortic valve. There is mild thickening of the aortic valve. Aortic valve  regurgitation is mild to moderate. Mild to moderate aortic valve  sclerosis/calcification is present,  without any evidence of aortic stenosis.  8. There is mild (Grade II) atheroma plaque involving the aortic root and  ascending aorta.    Neuro/Psych   Headaches, PSYCHIATRIC DISORDERS Anxiety Depression    GI/Hepatic negative GI ROS, Neg liver ROS,   Endo/Other  negative endocrine ROS  Renal/GU negative Renal ROS  negative genitourinary   Musculoskeletal negative musculoskeletal ROS (+)   Abdominal   Peds  Hematology negative hematology ROS (+)   Anesthesia Other Findings 70 year old woman with recent COVID infection who was admitted with acute on chronic combined systolic and diastolic heart failure.  She also has a history of left bundle branch block, hypertension, hyperlipidemia, COPD and venous insufficiency.  Reproductive/Obstetrics                           Anesthesia Physical Anesthesia Plan  ASA: 4  Anesthesia Plan: General   Post-op Pain Management:    Induction: Intravenous  PONV Risk Score and Plan: 3 and Propofol infusion and Treatment may vary due to age or medical condition  Airway Management Planned: Natural Airway  Additional Equipment:   Intra-op Plan:   Post-operative Plan:   Informed Consent: I have reviewed the patients History and Physical, chart, labs and discussed the procedure including the risks, benefits and alternatives for the proposed anesthesia with the patient or authorized representative who has indicated his/her understanding and acceptance.     Dental advisory given  Plan Discussed with: CRNA  Anesthesia Plan Comments:         Anesthesia Quick Evaluation

## 2020-08-10 NOTE — TOC Benefit Eligibility Note (Signed)
Patient Teacher, English as a foreign language completed.    The patient is currently admitted and upon discharge could be taking Eliquis 5 mg.  The current 30 day co-pay is, $9.85.   The patient is currently admitted and upon discharge could be taking Xarelto 20 mg.  The current 30 day co-pay is, $9.85.   The patient is insured through [place] [place] Patient Advocate Specialist [place] Team Direct Number: [phone]  Fax: [phone]

## 2020-08-11 ENCOUNTER — Inpatient Hospital Stay (HOSPITAL_COMMUNITY): Payer: Medicare Other | Admitting: Anesthesiology

## 2020-08-11 ENCOUNTER — Encounter (HOSPITAL_COMMUNITY): Payer: Self-pay | Admitting: Cardiovascular Disease

## 2020-08-11 ENCOUNTER — Encounter (HOSPITAL_COMMUNITY)
Admission: EM | Disposition: A | Discharge status: Home Health | Payer: Self-pay | Source: Home / Self Care | Attending: Cardiovascular Disease

## 2020-08-11 HISTORY — PX: CARDIOVERSION: SHX1299

## 2020-08-11 LAB — CBC
HCT: 37.1 % (ref 36.0–46.0)
Hemoglobin: 11.3 g/dL — ABNORMAL LOW (ref 12.0–15.0)
MCH: 28 pg (ref 26.0–34.0)
MCHC: 30.5 g/dL (ref 30.0–36.0)
MCV: 92.1 fL (ref 80.0–100.0)
Platelets: 153 10*3/uL (ref 150–400)
RBC: 4.03 MIL/uL (ref 3.87–5.11)
RDW: 17.4 % — ABNORMAL HIGH (ref 11.5–15.5)
WBC: 5.3 10*3/uL (ref 4.0–10.5)
nRBC: 0.8 % — ABNORMAL HIGH (ref 0.0–0.2)

## 2020-08-11 SURGERY — CARDIOVERSION
Anesthesia: General

## 2020-08-11 MED ORDER — AMIODARONE HCL 200 MG PO TABS
200.0000 mg | ORAL_TABLET | Freq: Every day | ORAL | Status: DC
  Administered 2020-08-11: 200 mg via ORAL
  Filled 2020-08-11: qty 1

## 2020-08-11 MED ORDER — AMIODARONE HCL IN DEXTROSE 360-4.14 MG/200ML-% IV SOLN
30.0000 mg/h | INTRAVENOUS | Status: DC
  Administered 2020-08-11 – 2020-08-13 (×4): 30 mg/h via INTRAVENOUS
  Filled 2020-08-11 (×4): qty 200

## 2020-08-11 MED ORDER — GABAPENTIN 100 MG PO CAPS
100.0000 mg | ORAL_CAPSULE | Freq: Every day | ORAL | Status: DC
  Administered 2020-08-11 – 2020-08-12 (×2): 100 mg via ORAL
  Filled 2020-08-11 (×2): qty 1

## 2020-08-11 MED ORDER — DIGOXIN 0.25 MG/ML IJ SOLN
INTRAMUSCULAR | Status: AC
  Administered 2020-08-11: 0.25 mg via INTRAVENOUS
  Filled 2020-08-11: qty 2

## 2020-08-11 MED ORDER — AMIODARONE LOAD VIA INFUSION
150.0000 mg | Freq: Once | INTRAVENOUS | Status: AC
  Administered 2020-08-11: 150 mg via INTRAVENOUS
  Filled 2020-08-11: qty 83.34

## 2020-08-11 MED ORDER — SODIUM CHLORIDE 0.9 % IV SOLN: INTRAVENOUS | Status: DC

## 2020-08-11 MED ORDER — PROPOFOL 10 MG/ML IV BOLUS
INTRAVENOUS | Status: DC | PRN
  Administered 2020-08-11: 40 mg via INTRAVENOUS

## 2020-08-11 MED ORDER — LIDOCAINE 2% (20 MG/ML) 5 ML SYRINGE
INTRAMUSCULAR | Status: DC | PRN
  Administered 2020-08-11: 60 mg via INTRAVENOUS

## 2020-08-11 MED ORDER — AMIODARONE HCL IN DEXTROSE 360-4.14 MG/200ML-% IV SOLN
60.0000 mg/h | INTRAVENOUS | Status: DC
  Administered 2020-08-11 (×2): 60 mg/h via INTRAVENOUS
  Filled 2020-08-11: qty 200

## 2020-08-11 NOTE — Progress Notes (Signed)
Ref: Maria Billings, NP   Subjective:  [place] from possible gabapentin use. Moderate respiratory distress at times. Discussed using shorter duration of deep anesthesia with anesthesia nurses. Patient agreeable to cardioversion. Cardioversion and Amiodarone per EP. Moniotr remains in atrial fibrillation with V paced and VPCs in bigeminy pattern.  Objective:  Vital Signs in the last 24 hours: Temp:  [97.6 F (36.4 C)-97.7 F (36.5 C)] 97.6 F (36.4 C) (08/10 0433) Pulse Rate:  [49-86] 83 (08/10 0433) Cardiac Rhythm: Ventricular paced (08/09 1930) Resp:  [14-23] 18 (08/10 0433) BP: (99-113)/(29-60) 103/60 (08/09 2115) SpO2:  [92 %-100 %] 94 % (08/10 0433) FiO2 (%):  [36 %] 36 % (08/09 0841) Weight:  [87.7 kg-87.9 kg] 87.7 kg (08/10 0433)  Physical Exam: BP Readings from Last 1 Encounters:  08/10/20 103/60     Wt Readings from Last 1 Encounters:  08/11/20 87.7 kg    Weight change: 0 kg Body mass index is 34.25 kg/m. HEENT: [place]/AT, Eyes-Blue, Conjunctiva-Pink, Sclera-Non-icteric Neck: No JVD, No bruit, Trachea midline. Lungs:  Fine basal crackles, Bilateral. Cardiac:  Regular rhythm, normal S1 and S2, no S3. II/VI systolic murmur. Abdomen:  Soft, non-tender. BS present. Extremities:  1 + edema present. No cyanosis. No clubbing. CNS: AxOx3, Cranial nerves grossly intact, moves all 4 extremities.  Skin: Warm and dry.   Intake/Output from previous day: 08/09 0701 - 08/10 0700 In: 630.3 [P.O.:140; I.V.:490.3] Out: 200 [Urine:200]    Lab Results: BMET    Component Value Date/Time   NA 136 08/10/2020 0203   NA 137 08/09/2020 0038   NA 138 08/08/2020 0505   NA 143 05/13/2020 1314   NA 144 05/03/2020 1334   NA 141 03/10/2020 1536   NA 139 12/07/2013 1207   NA 141 09/21/2013 1133   NA 137 04/22/2013 1032   K 4.2 08/10/2020 0203   K 4.8 08/09/2020 0038   K 4.7 08/08/2020 0505   K 3.8 12/07/2013 1207   K 4.1 09/21/2013 1133   K 3.7 04/22/2013 1032   CL 97 (L)  08/10/2020 0203   CL 99 08/09/2020 0038   CL 100 08/08/2020 0505   CL 99 12/07/2013 1207   CL 104 09/21/2013 1133   CL 102 04/22/2013 1032   CO2 29 08/10/2020 0203   CO2 28 08/09/2020 0038   CO2 28 08/08/2020 0505   CO2 31 12/07/2013 1207   CO2 32 09/21/2013 1133   CO2 31 04/22/2013 1032   GLUCOSE 137 (H) 08/10/2020 0203   GLUCOSE 115 (H) 08/09/2020 0038   GLUCOSE 101 (H) 08/08/2020 0505   GLUCOSE 97 12/07/2013 1207   GLUCOSE 97 09/21/2013 1133   GLUCOSE 143 (H) 04/22/2013 1032   BUN 55 (H) 08/10/2020 0203   BUN 58 (H) 08/09/2020 0038   BUN 61 (H) 08/08/2020 0505   BUN 27 05/13/2020 1314   BUN 35 (H) 05/03/2020 1334   BUN 29 (H) 03/10/2020 1536   BUN 8 12/07/2013 1207   BUN 15 09/21/2013 1133   BUN 15 04/22/2013 1032   CREATININE 2.09 (H) 08/10/2020 0203   CREATININE 2.10 (H) 08/09/2020 0038   CREATININE 2.36 (H) 08/08/2020 0505   CREATININE 0.77 12/07/2013 1207   CREATININE 0.79 09/21/2013 1133   CREATININE 0.76 04/22/2013 1032   CALCIUM 9.0 08/10/2020 0203   CALCIUM 9.2 08/09/2020 0038   CALCIUM 9.0 08/08/2020 0505   CALCIUM 8.7 12/07/2013 1207   CALCIUM 9.2 09/21/2013 1133   CALCIUM 8.9 04/22/2013 1032   GFRNONAA 25 (L)  08/10/2020 0203   GFRNONAA 25 (L) 08/09/2020 0038   GFRNONAA 22 (L) 08/08/2020 0505   GFRNONAA >60 12/07/2013 1207   GFRNONAA >60 09/21/2013 1133   GFRNONAA >60 04/22/2013 1032   GFRNONAA >60 02/23/2013 0506   GFRAA 41 (L) 09/04/2019 1114   GFRAA 58 (L) 03/28/2019 1336   GFRAA 55 (L) 03/19/2019 1509   GFRAA >60 12/07/2013 1207   GFRAA >60 09/21/2013 1133   GFRAA >60 04/22/2013 1032   GFRAA >60 02/23/2013 0506   CBC    Component Value Date/Time   WBC 5.3 08/11/2020 0332   RBC 4.03 08/11/2020 0332   HGB 11.3 (L) 08/11/2020 0332   HGB 13.6 09/05/2018 1401   HCT 37.1 08/11/2020 0332   HCT 40.4 09/05/2018 1401   PLT 153 08/11/2020 0332   PLT 178 09/05/2018 1401   MCV 92.1 08/11/2020 0332   MCV 88 09/05/2018 1401   MCV 91 12/07/2013 1207    MCH 28.0 08/11/2020 0332   MCHC 30.5 08/11/2020 0332   RDW 17.4 (H) 08/11/2020 0332   RDW 15.6 (H) 09/05/2018 1401   RDW 14.6 (H) 12/07/2013 1207   LYMPHSABS 1.1 08/07/2020 1241   LYMPHSABS 2.7 09/05/2018 1401   LYMPHSABS 1.0 02/23/2013 0506   MONOABS 0.6 08/07/2020 1241   MONOABS 0.1 (L) 02/23/2013 0506   EOSABS 0.1 08/07/2020 1241   EOSABS 0.0 02/23/2013 0506   BASOSABS 0.0 08/07/2020 1241   BASOSABS 0.0 02/23/2013 0506   HEPATIC Function Panel Recent Labs    05/03/20 1334 05/13/20 1314 08/07/20 1241  PROT 7.2 6.9 6.7   HEMOGLOBIN A1C No components found for: HGA1C,  MPG CARDIAC ENZYMES Lab Results  Component Value Date   TROPONINI <0.03 09/02/2014   TROPONINI <0.03 09/02/2014   TROPONINI <0.03 05/07/2014   BNP No results for input(s): PROBNP in the last 8760 hours. TSH No results for input(s): TSH in the last 8760 hours. CHOLESTEROL Recent Labs    03/10/20 1536  CHOL 202*    Scheduled Meds:  amiodarone  200 mg Oral Daily   apixaban  5 mg Oral BID   digoxin  0.25 mg Intravenous Daily   fluticasone  2 spray Each Nare Daily   gabapentin  100 mg Oral QHS   loratadine  10 mg Oral Daily   pantoprazole  40 mg Oral Daily   sodium chloride flush  3 mL Intravenous Q12H   spironolactone  12.5 mg Oral Daily   Continuous Infusions:  sodium chloride 10 mL/hr at 08/10/20 1234   sodium chloride     PRN Meds:.sodium chloride, acetaminophen, albuterol, benzonatate, diclofenac Sodium, ondansetron (ZOFRAN) IV, sodium chloride flush, triamcinolone  Assessment/Plan:  Acute on chronic systolic left heart failure Acute on chronic respiratory failure with hypoxia Chronic atrial fibrillation Dilated cardiomyopathy S/P ICD CAD HTN COPD S/P COVID infection Acute renal failure on CKD, IIIa Severe TR Moderate MR Mild AI and PI  Appreciate EP consult. Consider external cardioversion Add amiodarone 200 mg. Daily without loading due to hypotension. Decrease  gabapentin to 100 mg. Q HS. Due to excess sedation.   LOS: 4 days   Time spent including chart review, lab review, examination, discussion with patient/Family/Nurse and doctor : 30 min   Dixie Dials  MD  08/11/2020, 8:03 AM

## 2020-08-11 NOTE — Anesthesia Procedure Notes (Addendum)
Procedure Name: General with mask airway Date/Time: 08/11/2020 11:25 AM Performed by: Rande Brunt, CRNA Pre-anesthesia Checklist: Patient identified, Emergency Drugs available, Suction available, Patient being monitored and Timeout performed Patient Re-evaluated:Patient Re-evaluated prior to induction Oxygen Delivery Method: Ambu bag Preoxygenation: Pre-oxygenation with 100% oxygen Induction Type: IV induction Placement Confirmation: positive ETCO2 and CO2 detector Dental Injury: Teeth and Oropharynx as per pre-operative assessment

## 2020-08-11 NOTE — Care Management Important Message (Signed)
Important Message  Patient Details  Name: KAMARYN GRIMLEY MRN: [id number] Date of Birth: 1950/01/04   Medicare Important Message Given:  Yes     Shelda Altes 08/11/2020, 8:31 AM

## 2020-08-11 NOTE — Progress Notes (Signed)
Mobility Specialist: Progress Note   08/11/20 1530  Mobility  Activity Ambulated in hall  Level of Assistance Contact guard assist, steadying assist  Assistive Device Front wheel walker  Distance Ambulated (ft) 120 ft  Mobility Ambulated with assistance in hallway  Mobility Response Tolerated well  Mobility performed by Mobility specialist  Bed Position Chair  $Mobility charge 1 Mobility   Pre-Mobility: 93 HR, 94% SpO2 During Mobility: 88% SpO2 Post-Mobility: 95 HR, 114/58 BP, 91% SpO2  Pt required pericare upon standing and was agreeable to ambulation. Pt required contact guard d/t unsteadiness, asx throughout. Pt ambulated on 4 L/min [place]. Pt to recliner after walk with call bell and phone at her side.  [facility] [place] Mobility Specialist Mobility Specialist Phone: [phone]

## 2020-08-11 NOTE — Anesthesia Postprocedure Evaluation (Signed)
Anesthesia Post Note  Patient: [name]  Procedure(s) Performed: CARDIOVERSION     Patient location during evaluation: Endoscopy Anesthesia Type: General Level of consciousness: awake and alert Pain management: pain level controlled Vital Signs Assessment: post-procedure vital signs reviewed and stable Respiratory status: spontaneous breathing, nonlabored ventilation, respiratory function stable and patient connected to nasal cannula oxygen Cardiovascular status: blood pressure returned to baseline and stable Postop Assessment: no apparent nausea or vomiting Anesthetic complications: no   No notable events documented.  Last Vitals:  Vitals:   08/11/20 1143 08/11/20 1152  BP: (!) 117/40 (!) 119/40  Pulse: (!) 41 (!) 41  Resp: 15 (!) 30  Temp:    SpO2: 93% 94%    Last Pain:  Vitals:   08/11/20 1152  TempSrc:   PainSc: 0-No pain                 Jevaun Strick,W. EDMOND

## 2020-08-11 NOTE — Transfer of Care (Signed)
Immediate Anesthesia Transfer of Care Note  Patient: CHARNETTA WULFF  Procedure(s) Performed: CARDIOVERSION  Patient Location: Endoscopy Unit  Anesthesia Type:General  Level of Consciousness: drowsy, patient cooperative and responds to stimulation  Airway & Oxygen Therapy: Patient Spontanous Breathing and Patient connected to nasal cannula oxygen  Post-op Assessment: Report given to RN, Post -op Vital signs reviewed and stable and Patient moving all extremities  Post vital signs: Reviewed and stable  Last Vitals:  Vitals Value Taken Time  BP 118/91   Temp    Pulse 69 08/11/20 1125  Resp 21 08/11/20 1125  SpO2 90 % 08/11/20 1125  Vitals shown include unvalidated device data.  Last Pain:  Vitals:   08/11/20 1027  TempSrc: Oral  PainSc: 0-No pain         Complications: No notable events documented.

## 2020-08-11 NOTE — Anesthesia Preprocedure Evaluation (Addendum)
Anesthesia Evaluation  Patient identified by MRN, date of birth, ID band Patient awake    Reviewed: Allergy & Precautions, H&P , NPO status , Patient's Chart, lab work & pertinent test results  Airway Mallampati: II  TM Distance: >3 FB Neck ROM: Full    Dental no notable dental hx. (+) Edentulous Upper, Edentulous Lower, Dental Advisory Given   Pulmonary COPD,  COPD inhaler and oxygen dependent, former smoker,    Pulmonary exam normal breath sounds clear to auscultation       Cardiovascular hypertension, Pt. on medications + CAD and +CHF  + dysrhythmias Atrial Fibrillation + Cardiac Defibrillator + Valvular Problems/Murmurs  Rhythm:Irregular Rate:Normal     Neuro/Psych  Headaches, Anxiety Depression    GI/Hepatic negative GI ROS, Neg liver ROS,   Endo/Other  negative endocrine ROS  Renal/GU Renal disease  negative genitourinary   Musculoskeletal   Abdominal   Peds  Hematology negative hematology ROS (+)   Anesthesia Other Findings   Reproductive/Obstetrics negative OB ROS                            Anesthesia Physical Anesthesia Plan  ASA: 4  Anesthesia Plan: General   Post-op Pain Management:    Induction: Intravenous  PONV Risk Score and Plan: 3 and Propofol infusion and Treatment may vary due to age or medical condition  Airway Management Planned: Mask  Additional Equipment:   Intra-op Plan:   Post-operative Plan:   Informed Consent: I have reviewed the patients History and Physical, chart, labs and discussed the procedure including the risks, benefits and alternatives for the proposed anesthesia with the patient or authorized representative who has indicated his/her understanding and acceptance.     Dental advisory given  Plan Discussed with: CRNA  Anesthesia Plan Comments:         Anesthesia Quick Evaluation

## 2020-08-11 NOTE — CV Procedure (Signed)
PRE-OP DIAGNOSIS:  atrial fibrillation.  POST-OP DIAGNOSIS:  Sinus rhythm.  OPERATOR:  Dixie Dials, MD.     ANESTHESIA:  60 mg. Lidocaine and 40 mg. propofol.  COMPLICATIONS:  None.   OPERATIVE TERM:  DC cardioversion.  The nature of the procedure, risks and alternatives were discussed with the patient/Family who gave informed consent.  OPERATIVE TECHNIQUE:  The patient was sedated with 40 mg. Of Propofol.  When the patient was no longer responsive to quiet voice, DC cardioversion was performed with 120 J biphasically and synchronously.  Successful cardioversion to sinus rhythm.  The patient was then monitored until fully alert and left the procedure area in stable condition.  IMPRESSION:  Successful DC cardioversion.

## 2020-08-11 NOTE — Progress Notes (Addendum)
Telemetry and chart review. TEE no DCCV yesterday with question of clot vs veg on device lead. Heparin transitioned to eliquis and BC drawn. BP remains a bit soft  Telemetry remains in Afib, largely in the bigeminal pattern V pacing/intrinsic beats (V sense response pacing at times with these), rare brief 4-6beat WCT, these I suspect are AF HRs 80's  Dr. Quentin Ore has discussed case with Dr. Doylene Canard, his final thoughts and recommendations to follow.  EP follow up will be arranged.  Tommye Standard, PA-C  ADDEND Dr. Curt Bears discussed case with Dr. Doylene Canard this morning with plans for amiodarone, Dr. Doylene Canard did start PO amiodarone was started though prefer IV load. Order written  Tommye Standard, PA-C

## 2020-08-11 NOTE — Discharge Instructions (Addendum)
Electrical Cardioversion °Electrical cardioversion is the delivery of a jolt of electricity to restore a normal rhythm to the heart. A rhythm that is too fast or is not regular keeps the heart from pumping well. In this procedure, sticky patches or metal paddles are placed on the chest to deliver electricity to the heart from a device. °This procedure may be done in an emergency if: °There is low or no blood pressure as a result of the heart rhythm. °Normal rhythm must be restored as fast as possible to protect the brain and heart from further damage. °It may save a life. °This may also be a scheduled procedure for irregular or fast heart rhythms that are not immediately life-threatening. ° °What can I expect after the procedure? °Your blood pressure, heart rate, breathing rate, and blood oxygen level will be monitored until you leave the hospital or clinic. °Your heart rhythm will be watched to make sure it does not change. °You may have some redness on the skin where the shocks were given. Over the counter cortizone cream may be helpful.  °Follow these instructions at home: °Do not drive for 24 hours if you were given a sedative during your procedure. °Take over-the-counter and prescription medicines only as told by your health care provider. °Ask your health care provider how to check your pulse. Check it often. °Rest for 48 hours after the procedure or as told by your health care provider. °Avoid or limit your caffeine use as told by your health care provider. °Keep all follow-up visits as told by your health care provider. This is important. °Contact a health care provider if: °You feel like your heart is beating too quickly or your pulse is not regular. °You have a serious muscle cramp that does not go away. °Get help right away if: °You have discomfort in your chest. °You are dizzy or you feel faint. °You have trouble breathing or you are short of breath. °Your speech is slurred. °You have trouble moving an  arm or leg on one side of your body. °Your fingers or toes turn cold or blue. °Summary °Electrical cardioversion is the delivery of a jolt of electricity to restore a normal rhythm to the heart. °This procedure may be done right away in an emergency or may be a scheduled procedure if the condition is not an emergency. °Generally, this is a safe procedure. °After the procedure, check your pulse often as told by your health care provider. °This information is not intended to replace advice given to you by your health care provider. Make sure you discuss any questions you have with your health care provider. °Document Revised: 07/22/2018 Document Reviewed: 07/22/2018 °Elsevier Patient Education © 2020 [employer]  ° °Information on my medicine - ELIQUIS® (apixaban) ° °Why was Eliquis® prescribed for you? °Eliquis® was prescribed for you to reduce the risk of a blood clot forming that can cause a stroke if you have a medical condition called atrial fibrillation (a type of irregular heartbeat). ° °What do You need to know about Eliquis® ? °Take your Eliquis® TWICE DAILY - one tablet in the morning and one tablet in the evening with or without food. If you have difficulty swallowing the tablet whole please discuss with your pharmacist how to take the medication safely. ° °Take Eliquis® exactly as prescribed by your doctor and DO NOT stop taking Eliquis® without talking to the doctor who prescribed the medication.  Stopping may increase your risk of developing a stroke.  Refill   your prescription before you run out. ° °After discharge, you should have regular check-up appointments with your healthcare provider that is prescribing your Eliquis®.  In the future your dose may need to be changed if your kidney function or weight changes by a significant amount or as you get older. ° °What do you do if you miss a dose? °If you miss a dose, take it as soon as you remember on the same day and resume taking twice daily.  Do not  take more than one dose of ELIQUIS at the same time to make up a missed dose. ° °Important Safety Information °A possible side effect of Eliquis® is bleeding. You should call your healthcare provider right away if you experience any of the following: °Bleeding from an injury or your nose that does not stop. °Unusual colored urine (red or dark brown) or unusual colored stools (red or black). °Unusual bruising for unknown reasons. °A serious fall or if you hit your head (even if there is no bleeding). ° °Some medicines may interact with Eliquis® and might increase your risk of bleeding or clotting while on Eliquis®. To help avoid this, consult your healthcare provider or pharmacist prior to using any new prescription or non-prescription medications, including herbals, vitamins, non-steroidal anti-inflammatory drugs (NSAIDs) and supplements. ° °This website has more information on Eliquis® (apixaban): http://www.eliquis.com/eliquis/home  °

## 2020-08-12 ENCOUNTER — Encounter (HOSPITAL_COMMUNITY): Payer: Self-pay | Admitting: Cardiovascular Disease

## 2020-08-12 DIAGNOSIS — I472 Ventricular tachycardia: Secondary | ICD-10-CM | POA: Diagnosis not present

## 2020-08-12 DIAGNOSIS — I4819 Other persistent atrial fibrillation: Secondary | ICD-10-CM | POA: Diagnosis not present

## 2020-08-12 DIAGNOSIS — I340 Nonrheumatic mitral (valve) insufficiency: Secondary | ICD-10-CM | POA: Diagnosis not present

## 2020-08-12 DIAGNOSIS — I5021 Acute systolic (congestive) heart failure: Secondary | ICD-10-CM | POA: Diagnosis not present

## 2020-08-12 MED ORDER — TORSEMIDE 20 MG PO TABS
10.0000 mg | ORAL_TABLET | Freq: Once | ORAL | Status: AC
  Administered 2020-08-12: 10 mg via ORAL
  Filled 2020-08-12: qty 1

## 2020-08-12 MED ORDER — DIGOXIN 125 MCG PO TABS
0.0625 mg | ORAL_TABLET | Freq: Every day | ORAL | Status: DC
  Administered 2020-08-12 – 2020-08-13 (×2): 0.0625 mg via ORAL
  Filled 2020-08-12 (×2): qty 1

## 2020-08-12 MED ORDER — ALUM & MAG HYDROXIDE-SIMETH 200-200-20 MG/5ML PO SUSP
30.0000 mL | ORAL | Status: DC | PRN
  Administered 2020-08-12: 30 mL via ORAL
  Filled 2020-08-12: qty 30

## 2020-08-12 NOTE — Evaluation (Signed)
Physical Therapy Evaluation Patient Details Name: Maria Neal MRN: [id number] DOB: [birth year] Today's Date: 08/12/2020   History of Present Illness  70 years old white female who is 4 weeks post COVID infection has shortness of breath, leg edema and elevated BNP. at 1647.8 pg. She has PMH of AICD, CAD, COPD, cardiomyopathy, depression, HTN, insomnia and restless leg syndrome. S/P cardioversion 08/11/20.   Clinical Impression  Patient sitting up in bed on arrival. States she is waiting to take a shower. Patient is agreeable to PT assessment. She requires min guard for sit to stand and min guard with RW for ambulation in hall. O2 saturations continue to drop with mobility. Patient has decreased safety awareness. She will continue to benefit from skilled PT while here to improve functional independence and safety.     Follow Up Recommendations Home health PT;Supervision/Assistance - 24 hour    Equipment Recommendations  Rolling walker with 5" wheels    Recommendations for Other Services       Precautions / Restrictions Precautions Precautions: Fall Restrictions Weight Bearing Restrictions: No      Mobility  Bed Mobility Overal bed mobility: Modified Independent             General bed mobility comments: rails, no physical assist    Transfers Overall transfer level: Needs assistance Equipment used: 1 person hand held assist Transfers: Sit to/from Stand;Stand Pivot Transfers Sit to Stand: Min guard;Min assist Stand pivot transfers: Min guard;Min assist          Ambulation/Gait Ambulation/Gait assistance: Min guard Gait Distance (Feet): 150 Feet Assistive device: Rolling walker (2 wheeled) Gait Pattern/deviations: Step-through pattern;Decreased step length - right;Decreased step length - left;Shuffle Gait velocity: decr   General Gait Details: patient requires AD and min guard, O2 sats dropped to 85% while ambulating on 4 lpm.  Stairs            Wheelchair  Mobility    Modified Rankin (Stroke Patients Only)       Balance Overall balance assessment: Needs assistance Sitting-balance support: Feet supported Sitting balance-Leahy Scale: Good     Standing balance support: Bilateral upper extremity supported;During functional activity Standing balance-Leahy Scale: Fair Standing balance comment: reliant on at least single UE support and min guard                             Pertinent Vitals/Pain Pain Assessment: No/denies pain    Home Living Family/patient expects to be discharged to:: Private residence Living Arrangements: Children Available Help at Discharge: Family;Available PRN/intermittently Type of Home: Mobile home Home Access: Stairs to enter   Entrance Stairs-Number of Steps: 6 in front 2 in back Home Layout: One level   Additional Comments: unsure, patient reports she does not use anything. Says she drives    Prior Function Level of Independence: Independent               Hand Dominance        Extremity/Trunk Assessment   Upper Extremity Assessment Upper Extremity Assessment: Overall WFL for tasks assessed;Generalized weakness    Lower Extremity Assessment Lower Extremity Assessment: Overall WFL for tasks assessed;Generalized weakness    Cervical / Trunk Assessment Cervical / Trunk Assessment: Kyphotic  Communication   Communication: No difficulties  Cognition Arousal/Alertness: Awake/alert Behavior During Therapy: WFL for tasks assessed/performed Overall Cognitive Status: No family/caregiver present to determine baseline cognitive functioning  General Comments: seems a bit off, wants to take shower. Does not recall walking with mobility specialist previously      General Comments      Exercises     Assessment/Plan    PT Assessment Patient needs continued PT services  PT Problem List Decreased strength;Decreased mobility;Decreased  activity tolerance;Decreased balance;Decreased cognition;Decreased safety awareness;Cardiopulmonary status limiting activity       PT Treatment Interventions DME instruction;Gait training;Functional mobility training;Stair training;Therapeutic activities;Patient/family education;Therapeutic exercise;Cognitive remediation;Balance training    PT Goals (Current goals can be found in the Care Plan section)  Acute Rehab PT Goals Patient Stated Goal: to go home PT Goal Formulation: With patient Time For Goal Achievement: 08/19/20 Potential to Achieve Goals: Fair    Frequency Min 2X/week   Barriers to discharge Decreased caregiver support son works during the day is with her at night, she says her brother could stay with her if needed.    Co-evaluation               AM-PAC PT "6 Clicks" Mobility  Outcome Measure Help needed turning from your back to your side while in a flat bed without using bedrails?: A Little Help needed moving from lying on your back to sitting on the side of a flat bed without using bedrails?: A Little Help needed moving to and from a bed to a chair (including a wheelchair)?: A Little Help needed standing up from a chair using your arms (e.g., wheelchair or bedside chair)?: A Little Help needed to walk in hospital room?: A Little Help needed climbing 3-5 steps with a railing? : A Lot 6 Click Score: 17    End of Session Equipment Utilized During Treatment: Gait belt;Oxygen Activity Tolerance: Patient limited by fatigue Patient left: in chair;with call bell/phone within reach Nurse Communication: Mobility status PT Visit Diagnosis: Other abnormalities of gait and mobility (R26.89);Difficulty in walking, not elsewhere classified (R26.2);Muscle weakness (generalized) (M62.81)    Time: 4174-0814 PT Time Calculation (min) (ACUTE ONLY): 25 min   Charges:   PT Evaluation $PT Eval Moderate Complexity: 1 Mod PT Treatments $Gait Training: 8-22 mins         Eulalah Rupert, PT, GCS 08/12/20,1:57 PM

## 2020-08-12 NOTE — TOC Initial Note (Signed)
Transition of Care [facility]) - Initial/Assessment Note    Patient Details  Name: Maria Neal MRN: [id number] Date of Birth: 13-Mar-1950  Transition of Care [facility]) CM/SW Contact:    Verdell Carmine, RN Phone Number: 08/12/2020, 10:35 AM  Clinical Narrative:                 71 YO tested positive for COVID 4 weeks ago, has oxygen at home 3LPM, history of COPD, CHF< now with EF of 25%.      Should follow up at HF clinic. /Cardiac Rehab. Will order PT consult to assess patient for [facility] needs and their recommendations.   Expected Discharge Plan: [place] Barriers to Discharge: Continued Medical Work up   Patient Goals and CMS Choice        Expected Discharge Plan and Services Expected Discharge Plan: [place]       Living arrangements for the past 2 months: Mobile Home                                      Prior Living Arrangements/Services Living arrangements for the past 2 months: Mobile Home Lives with:: Self Patient language and need for interpreter reviewed:: Yes        Need for Family Participation in Patient Care: Yes (Comment) Care giver support system in place?: Yes (comment)   Criminal Activity/Legal Involvement Pertinent to Current Situation/Hospitalization: No - Comment as needed  Activities of Daily Living Home Assistive Devices/Equipment: Oxygen ADL Screening (condition at time of admission) Patient's cognitive ability adequate to safely complete daily activities?: No Is the patient deaf or have difficulty hearing?: No Does the patient have difficulty seeing, even when wearing glasses/contacts?: No Does the patient have difficulty concentrating, remembering, or making decisions?: Yes Patient able to express need for assistance with ADLs?: No Does the patient have difficulty dressing or bathing?: No Independently performs ADLs?: No Communication: Independent Dressing (OT): Needs assistance Is this a change from  baseline?: Change from baseline, expected to last >3 days Grooming: Independent with device (comment) Feeding: Needs assistance Is this a change from baseline?: Change from baseline, expected to last >3 days Bathing: Independent with device (comment) Toileting: Independent with device (comment) In/Out Bed: Needs assistance Is this a change from baseline?: Change from baseline, expected to last <3 days Walks in Home: Independent with device (comment) Does the patient have difficulty walking or climbing stairs?: Yes Weakness of Legs: None Weakness of Arms/Hands: None  Permission Sought/Granted                  Emotional Assessment       Orientation: : Oriented to Self, Oriented to Place, Oriented to  Time, Oriented to Situation Alcohol / Substance Use: Not Applicable Psych Involvement: No (comment)  Admission diagnosis:  Acute systolic heart failure (HCC) [I50.21] Pain [R52] AKI (acute kidney injury) ([place]) [N17.9] Cardiorenal syndrome with renal failure, stage 1-4 or unspecified chronic kidney disease, with heart failure ([place]) [I13.0] Other hypervolemia [E87.79] Acute on chronic congestive heart failure, unspecified heart failure type ([place]) [I50.9] COVID-19 [U07.1] Patient Active Problem List   Diagnosis Date Noted   Acute systolic heart failure ([place]) 08/07/2020   Medication management 06/25/2020   Chronic kidney disease, stage 3a ([place]) 09/04/2019   IFG (impaired fasting glucose) 04/01/2019   Leg pain 07/23/2018   Varicose veins of both lower extremities with  inflammation 07/23/2018   Chronic venous insufficiency 07/23/2018   Simple endometrial hyperplasia without atypia 07/04/2018   Epigastric pain 06/28/2018   Acute appendicitis 02/17/2018   Cervical radiculitis 05/17/2017   Hypercholesteremia 05/02/2017   Obesity (BMI 30-39.9) 05/02/2017   Mitral valve insufficiency 04/16/2017   Closed fracture of lateral malleolus 01/04/7251   Chronic systolic congestive heart  failure, NYHA class 3 ([place]) 08/07/2016   NICM (nonischemic cardiomyopathy) ([place]) 08/07/2016   Bundle branch block, left 10/02/2014   Benign essential HTN 05/07/2014   CAD (coronary artery disease) 05/07/2014   COPD (chronic obstructive pulmonary disease) ([place]) 05/07/2014   S/P cardiac catheterization 04/23/2014   SOB (shortness of breath) on exertion 11/06/2013   PCP:  Jon Billings, NP Pharmacy:   [facility] [address], [place] - [place] [address] Phone: [phone] Fax: [place], [place] [address] [address] Phone: [phone] Fax: [place], [place] [place] [address] Phone: [phone] Fax: [phone]     Social Determinants of Health (SDOH) Interventions    Readmission Risk Interventions No flowsheet data found.

## 2020-08-12 NOTE — Plan of Care (Signed)
  Problem: Health Behavior/Discharge Planning: Goal: Ability to manage health-related needs will improve Outcome: Progressing   Problem: Clinical Measurements: Goal: Ability to maintain clinical measurements within normal limits will improve Outcome: Progressing Goal: Will remain free from infection Outcome: Progressing Goal: Diagnostic test results will improve Outcome: Progressing Goal: Respiratory complications will improve Outcome: Progressing Goal: Cardiovascular complication will be avoided Outcome: Progressing   Problem: Activity: Goal: Risk for activity intolerance will decrease Outcome: Progressing   Problem: Nutrition: Goal: Adequate nutrition will be maintained Outcome: Progressing   Problem: Coping: Goal: Level of anxiety will decrease Outcome: Progressing   Problem: Elimination: Goal: Will not experience complications related to bowel motility Outcome: Progressing Goal: Will not experience complications related to urinary retention Outcome: Progressing   Problem: Pain Managment: Goal: General experience of comfort will improve Outcome: Progressing   Problem: Safety: Goal: Ability to remain free from injury will improve Outcome: Progressing   Problem: Skin Integrity: Goal: Risk for impaired skin integrity will decrease Outcome: Progressing   Problem: Education: Goal: Knowledge of disease or condition will improve Outcome: Progressing   Problem: Activity: Goal: Ability to tolerate increased activity will improve Outcome: Progressing Goal: Will verbalize the importance of balancing activity with adequate rest periods Outcome: Progressing   Problem: Respiratory: Goal: Ability to maintain a clear airway will improve Outcome: Progressing Goal: Levels of oxygenation will improve Outcome: Progressing Goal: Ability to maintain adequate ventilation will improve Outcome: Progressing   Problem: Education: Goal: Ability to verbalize understanding of  medication therapies will improve Outcome: Progressing   Problem: Activity: Goal: Capacity to carry out activities will improve Outcome: Progressing   Problem: Cardiac: Goal: Ability to achieve and maintain adequate cardiopulmonary perfusion will improve Outcome: Progressing

## 2020-08-12 NOTE — Progress Notes (Signed)
Ref: Jon Billings, NP   Subjective:  Awake. Alert today. BP is improving.  Successful external cardioversion yesterday. Pacing with wide QRS complex Still in sinus rhythm with v-pacing. Decreased VPCs. Mild respiratory distress continues. Discussed care with brother. Currently they are planning to take patient home.  Objective:  Vital Signs in the last 24 hours: Temp:  [97.5 F (36.4 C)-98 F (36.7 C)] 97.5 F (36.4 C) (08/11 0746) Pulse Rate:  [41-93] 63 (08/11 0746) Cardiac Rhythm: Ventricular paced (08/11 0735) Resp:  [15-30] 18 (08/11 0746) BP: (109-132)/(40-91) 117/70 (08/11 0746) SpO2:  [90 %-100 %] 93 % (08/11 0746) Weight:  [87.3 kg-87.7 kg] 87.3 kg (08/11 0522)  Physical Exam: BP Readings from Last 1 Encounters:  08/12/20 117/70     Wt Readings from Last 1 Encounters:  08/12/20 87.3 kg    Weight change: -0.2 kg Body mass index is 34.09 kg/m. HEENT: [place]/AT, Eyes-Blue, Conjunctiva-Pink, Sclera-Non-icteric Neck: No JVD, No bruit, Trachea midline. Lungs:  Fine basal crackles, Bilateral. Cardiac:  Regular, paced rhythm, normal S1 and S2, no S3. II/VI systolic murmur. Abdomen:  Soft, non-tender. BS present. Extremities:  Trace edema present. No cyanosis. No clubbing. CNS: AxOx3, Cranial nerves grossly intact, moves all 4 extremities.  Skin: Warm and dry.   Intake/Output from previous day: 08/10 0701 - 08/11 0700 In: 512.6 [I.V.:512.6] Out: -     Lab Results: BMET    Component Value Date/Time   NA 136 08/10/2020 0203   NA 137 08/09/2020 0038   NA 138 08/08/2020 0505   NA 143 05/13/2020 1314   NA 144 05/03/2020 1334   NA 141 03/10/2020 1536   NA 139 12/07/2013 1207   NA 141 09/21/2013 1133   NA 137 04/22/2013 1032   K 4.2 08/10/2020 0203   K 4.8 08/09/2020 0038   K 4.7 08/08/2020 0505   K 3.8 12/07/2013 1207   K 4.1 09/21/2013 1133   K 3.7 04/22/2013 1032   CL 97 (L) 08/10/2020 0203   CL 99 08/09/2020 0038   CL 100 08/08/2020 0505   CL 99  12/07/2013 1207   CL 104 09/21/2013 1133   CL 102 04/22/2013 1032   CO2 29 08/10/2020 0203   CO2 28 08/09/2020 0038   CO2 28 08/08/2020 0505   CO2 31 12/07/2013 1207   CO2 32 09/21/2013 1133   CO2 31 04/22/2013 1032   GLUCOSE 137 (H) 08/10/2020 0203   GLUCOSE 115 (H) 08/09/2020 0038   GLUCOSE 101 (H) 08/08/2020 0505   GLUCOSE 97 12/07/2013 1207   GLUCOSE 97 09/21/2013 1133   GLUCOSE 143 (H) 04/22/2013 1032   BUN 55 (H) 08/10/2020 0203   BUN 58 (H) 08/09/2020 0038   BUN 61 (H) 08/08/2020 0505   BUN 27 05/13/2020 1314   BUN 35 (H) 05/03/2020 1334   BUN 29 (H) 03/10/2020 1536   BUN 8 12/07/2013 1207   BUN 15 09/21/2013 1133   BUN 15 04/22/2013 1032   CREATININE 2.09 (H) 08/10/2020 0203   CREATININE 2.10 (H) 08/09/2020 0038   CREATININE 2.36 (H) 08/08/2020 0505   CREATININE 0.77 12/07/2013 1207   CREATININE 0.79 09/21/2013 1133   CREATININE 0.76 04/22/2013 1032   CALCIUM 9.0 08/10/2020 0203   CALCIUM 9.2 08/09/2020 0038   CALCIUM 9.0 08/08/2020 0505   CALCIUM 8.7 12/07/2013 1207   CALCIUM 9.2 09/21/2013 1133   CALCIUM 8.9 04/22/2013 1032   GFRNONAA 25 (L) 08/10/2020 0203   GFRNONAA 25 (L) 08/09/2020 0038   GFRNONAA  22 (L) 08/08/2020 0505   GFRNONAA >60 12/07/2013 1207   GFRNONAA >60 09/21/2013 1133   GFRNONAA >60 04/22/2013 1032   GFRNONAA >60 02/23/2013 0506   GFRAA 41 (L) 09/04/2019 1114   GFRAA 58 (L) 03/28/2019 1336   GFRAA 55 (L) 03/19/2019 1509   GFRAA >60 12/07/2013 1207   GFRAA >60 09/21/2013 1133   GFRAA >60 04/22/2013 1032   GFRAA >60 02/23/2013 0506   CBC    Component Value Date/Time   WBC 5.3 08/11/2020 0332   RBC 4.03 08/11/2020 0332   HGB 11.3 (L) 08/11/2020 0332   HGB 13.6 09/05/2018 1401   HCT 37.1 08/11/2020 0332   HCT 40.4 09/05/2018 1401   PLT 153 08/11/2020 0332   PLT 178 09/05/2018 1401   MCV 92.1 08/11/2020 0332   MCV 88 09/05/2018 1401   MCV 91 12/07/2013 1207   MCH 28.0 08/11/2020 0332   MCHC 30.5 08/11/2020 0332   RDW 17.4 (H)  08/11/2020 0332   RDW 15.6 (H) 09/05/2018 1401   RDW 14.6 (H) 12/07/2013 1207   LYMPHSABS 1.1 08/07/2020 1241   LYMPHSABS 2.7 09/05/2018 1401   LYMPHSABS 1.0 02/23/2013 0506   MONOABS 0.6 08/07/2020 1241   MONOABS 0.1 (L) 02/23/2013 0506   EOSABS 0.1 08/07/2020 1241   EOSABS 0.0 02/23/2013 0506   BASOSABS 0.0 08/07/2020 1241   BASOSABS 0.0 02/23/2013 0506   HEPATIC Function Panel Recent Labs    05/03/20 1334 05/13/20 1314 08/07/20 1241  PROT 7.2 6.9 6.7   HEMOGLOBIN A1C No components found for: HGA1C,  MPG CARDIAC ENZYMES Lab Results  Component Value Date   TROPONINI <0.03 09/02/2014   TROPONINI <0.03 09/02/2014   TROPONINI <0.03 05/07/2014   BNP No results for input(s): PROBNP in the last 8760 hours. TSH No results for input(s): TSH in the last 8760 hours. CHOLESTEROL Recent Labs    03/10/20 1536  CHOL 202*    Scheduled Meds:  apixaban  5 mg Oral BID   fluticasone  2 spray Each Nare Daily   gabapentin  100 mg Oral QHS   loratadine  10 mg Oral Daily   pantoprazole  40 mg Oral Daily   sodium chloride flush  3 mL Intravenous Q12H   spironolactone  12.5 mg Oral Daily   Continuous Infusions:  sodium chloride 10 mL/hr at 08/10/20 1234   sodium chloride     amiodarone 30 mg/hr (08/12/20 0639)   PRN Meds:.sodium chloride, acetaminophen, albuterol, alum & mag hydroxide-simeth, benzonatate, diclofenac Sodium, ondansetron (ZOFRAN) IV, sodium chloride flush, triamcinolone  Assessment/Plan:  Acute on chronic systolic left heart failure Acute on chronic respiratory failure with hypoxia H/O atrial fibrillation, CHA2DS2VASc score of 5 S/P CRT Dilated cardiomyopathy CAD HTN COPD S/P COVID infection Acute renal failure on CKD, IIIa Severe TR Moderate MR Mild AI and PI  Plan: CRT adjustment per EP today. Increase activity as tolerated. Continue amiodarone slow loading. Intermittent diuresis as needed.   LOS: 5 days   Time spent including chart review, lab  review, examination, discussion with patient/PA/EP/Family/Nurse : 40 min   Dixie Dials  MD  08/12/2020, 9:01 AM

## 2020-08-12 NOTE — Progress Notes (Addendum)
Progress Note  Patient Name: Maria Neal Date of Encounter: 08/12/2020  [facility] Cardiologist: Dr. Josefa Half  Subjective   "I'm doing OK", denies CP or SOB  Inpatient Medications    Scheduled Meds:  apixaban  5 mg Oral BID   fluticasone  2 spray Each Nare Daily   gabapentin  100 mg Oral QHS   loratadine  10 mg Oral Daily   pantoprazole  40 mg Oral Daily   sodium chloride flush  3 mL Intravenous Q12H   spironolactone  12.5 mg Oral Daily   Continuous Infusions:  sodium chloride 10 mL/hr at 08/10/20 1234   sodium chloride     amiodarone 30 mg/hr (08/12/20 0639)   PRN Meds: sodium chloride, acetaminophen, albuterol, alum & mag hydroxide-simeth, benzonatate, diclofenac Sodium, ondansetron (ZOFRAN) IV, sodium chloride flush, triamcinolone   Vital Signs    Vitals:   08/11/20 1559 08/11/20 1945 08/12/20 0522 08/12/20 0746  BP: (!) 125/53 (!) 109/56 (!) 114/56 117/70  Pulse: 93 68 62 63  Resp: 20 20 18 18   Temp: 97.7 F (36.5 C) 97.8 F (36.6 C) 98 F (36.7 C) (!) 97.5 F (36.4 C)  TempSrc: Oral Oral Axillary Oral  SpO2: 95% 90%  93%  Weight:   87.3 kg   Height:        Intake/Output Summary (Last 24 hours) at 08/12/2020 0806 Last data filed at 08/12/2020 0400 Gross per 24 hour  Intake 512.63 ml  Output --  Net 512.63 ml   Last 3 Weights 08/12/2020 08/11/2020 08/11/2020  Weight (lbs) 192 lb 7.4 oz 193 lb 5.5 oz 193 lb 5.5 oz  Weight (kg) 87.3 kg 87.7 kg 87.7 kg      Telemetry    SR, V paced, PVC burden is improving - Personally Reviewed  ECG    SR/V paced, V bigeminy - Personally Reviewed  Physical Exam   GEN: No acute distress.   Neck: No JVD Cardiac: RRR, occ extrasystoles, 1-2/6SM, rubs, or gallops.  Respiratory: CTA b/l. GI: Soft, nontender, non-distended  MS: No edema; No deformity. Neuro:  Nonfocal  Psych: Normal affect   Labs    High Sensitivity Troponin:   Recent Labs  Lab 08/07/20 1344 08/07/20 1544  TROPONINIHS 61* 62*       Chemistry Recent Labs  Lab 08/07/20 1241 08/07/20 1302 08/08/20 0505 08/09/20 0038 08/10/20 0203  NA 136   < > 138 137 136  K 5.1   < > 4.7 4.8 4.2  CL 99   < > 100 99 97*  CO2 27  --  28 28 29   GLUCOSE 112*   < > 101* 115* 137*  BUN 63*   < > 61* 58* 55*  CREATININE 2.50*   < > 2.36* 2.10* 2.09*  CALCIUM 9.2  --  9.0 9.2 9.0  PROT 6.7  --   --   --   --   ALBUMIN 3.3*  --   --   --   --   AST 16  --   --   --   --   ALT 10  --   --   --   --   ALKPHOS 57  --   --   --   --   BILITOT 1.4*  --   --   --   --   GFRNONAA 20*   < > 22* 25* 25*  ANIONGAP 10  --  10 10 10    < > =  values in this interval not displayed.     Hematology Recent Labs  Lab 08/09/20 0038 08/10/20 0203 08/11/20 0332  WBC 5.8 5.2 5.3  RBC 4.14 4.17 4.03  HGB 11.6* 11.7* 11.3*  HCT 37.9 38.0 37.1  MCV 91.5 91.1 92.1  MCH 28.0 28.1 28.0  MCHC 30.6 30.8 30.5  RDW 17.0* 17.2* 17.4*  PLT 148* 151 153    BNP Recent Labs  Lab 08/07/20 1344  BNP 1,647.8*     DDimer  Recent Labs  Lab 08/08/20 1335  DDIMER 1.21*     Radiology      Cardiac Studies    08/10/20; TEE IMPRESSION:   Limited study due to patient having hypoxia. Poor LV systolic function. EF 20-25 %. Severe TR. MR, AI, PI. Small thrombus or vegetation on ICD wire in RA. No definite LA thrombus.   08/08/20: TTE IMPRESSIONS   1. Left ventricular ejection fraction, by estimation, is 20 to 25%. The  left ventricle has severely decreased function. The left ventricle  demonstrates global hypokinesis. The left ventricular internal cavity size  was severely dilated. Left ventricular  diastolic parameters are indeterminate.   2. Right ventricular systolic function is severely reduced. The right  ventricular size is moderately enlarged. There is severely elevated  pulmonary artery systolic pressure.   3. Left atrial size was severely dilated.   4. Right atrial size was severely dilated.   5. The mitral valve is  degenerative. Moderate to severe mitral valve  regurgitation. Moderate mitral annular calcification.   6. Tricuspid valve regurgitation is severe.   7. The aortic valve is tricuspid. There is moderate calcification of the  aortic valve. There is mild thickening of the aortic valve. Aortic valve  regurgitation is mild to moderate. Mild to moderate aortic valve  sclerosis/calcification is present,  without any evidence of aortic stenosis.   8. There is mild (Grade II) atheroma plaque involving the aortic root and  ascending aorta.   9. The inferior vena cava is dilated in size with <50% respiratory  variability, suggesting right atrial pressure of 15 mmHg.   FINDINGS   Left Ventricle: Left ventricular ejection fraction, by estimation, is 20  to 25%. The left ventricle has severely decreased function. The left  ventricle demonstrates global hypokinesis. The left ventricular internal  cavity size was severely dilated.  There is borderline asymmetric left ventricular hypertrophy of the  posterior segment. Left ventricular diastolic parameters are  indeterminate.   Right Ventricle: The right ventricular size is moderately enlarged. No  increase in right ventricular wall thickness. Right ventricular systolic  function is severely reduced. There is severely elevated pulmonary artery  systolic pressure. The tricuspid  regurgitant velocity is 3.56 m/s, and with an assumed right atrial  pressure of 15 mmHg, the estimated right ventricular systolic pressure is  31.5 mmHg.   Left Atrium: Left atrial size was severely dilated.   Right Atrium: Right atrial size was severely dilated.   Pericardium: There is no evidence of pericardial effusion.   Mitral Valve: The mitral valve is degenerative in appearance. There is  mild thickening of the mitral valve leaflet(s). There is moderate  calcification of the mitral valve leaflet(s). Moderate mitral annular  calcification. Moderate to severe mitral  valve  regurgitation.   Tricuspid Valve: The tricuspid valve is grossly normal. Tricuspid valve  regurgitation is severe.   Aortic Valve: The aortic valve is tricuspid. There is moderate  calcification of the aortic valve. There is mild thickening of the  aortic  valve. There is moderate to severe aortic valve annular calcification.  Aortic valve regurgitation is mild to  moderate. Aortic regurgitation PHT measures 472 msec. Mild to moderate  aortic valve sclerosis/calcification is present, without any evidence of  aortic stenosis. Aortic valve mean gradient measures 3.0 mmHg. Aortic  valve peak gradient measures 6.4 mmHg.  Aortic valve area, by VTI measures 1.26 cm.   Pulmonic Valve: The pulmonic valve was normal in structure. Pulmonic valve  regurgitation is not visualized.   Aorta: The aortic root is normal in size and structure. There is mild  (Grade II) atheroma plaque involving the aortic root and ascending aorta.   Venous: The inferior vena cava is dilated in size with less than 50%  respiratory variability, suggesting right atrial pressure of 15 mmHg.   IAS/Shunts: The atrial septum is grossly normal.   Additional Comments: A device lead is visualized in the right atrium and  right ventricle.      05/30/19: TTE INTERPRETATION  SEVERE LV SYSTOLIC DYSFUNCTION (See above) 54% MILD RV SYSTOLIC DYSFUNCTION (See above)  MODERATE VALVULAR REGURGITATION (See above)  NO VALVULAR STENOSIS  MIODERATe MR, TR  MODERATE PHTN  MILD AR, PR  EF 25%   Patient Profile     70 y.o. female with a hx of NICM, ICD, LBBB, chronic CHF (systolic), HTN, HLD, COPD/chronic bronchitis (on 3L at home), chronic venous insuff. (status post laser ablation of the left great saphenous vein on 02/06/2019), s/p COVID infection approx 4 weeks ago admitted with increased SOB, episodes of confusion, falls, found with acute/chronic CHF, AFib   OUTPATIENT her last cards visit Dr. Josefa Half last note 08/02/20  mentions: Normal coronary anatomy by cardiac cath 04/14/2014 CRT-D interrogation on 05/18/2020 revealed normal function with predominant atrial sensing with ventricular pacing, 3 episodes of nonsustained VT, 1 episode of atrial tachycardia/atrial fibrillation for 19 days, with a longevity of 9 months   ICD information MDT CRT-D implanted 12/02/2014   Interrogation done this admission Battery is 4 mo to ERI Lead measurements are good AFib since roughly April/May time Optivol markedly elevated VP  (since 2020) 98.1% VSR pacing 1.3% BIVe pacing 99.9%   One monitored VT episode 7 seconds, not printed by industry rep for review, verbally reported by industry to have been 2 years ago Currently in AFib and appeared to be since about May   Assessment & Plan      WCT (12.7seconds),  Suspect this was AF w/RVR aberrancy, she has had a couple NSVT, 4-6 beats PVC burden is improving    2. Persistent AFib This seems a somewhat new Dx, not on a/c out pt CHA2DS2Vasc is 4 This is likely the cause of her acute HF decompensation Heparin gtt here > Eliquis DCCV yesterday Amio gtt started, BP tolerating would keep gtt today    Dr. Quentin Ore has seen and examined the patient We have reviewed her EKG and CXR Unusual lead positioning noted, paced QRS not ideal morphology for CRT We will return later with industry to try and optimize her CRT pacing     Continue management with attending/cardiology team 3. AKI 4. Acute/chronic CHF 5. NICM     Severe VHD by her echo, unfortunately TEE was abreviated 2/2 hypoxia during anesthesia      c/w primary cardiologist here and out patient 6. Few weeks of intermittent confusion, falls MS is improving, better today     For questions or updates, please contact [place] Please consult [email or web address] for contact info  under        Signed, Baldwin [place], PA-C  08/12/2020, 8:06 AM

## 2020-08-13 LAB — BASIC METABOLIC PANEL
Anion gap: 8 (ref 5–15)
BUN: 47 mg/dL — ABNORMAL HIGH (ref 8–23)
CO2: 32 mmol/L (ref 22–32)
Calcium: 9.4 mg/dL (ref 8.9–10.3)
Chloride: 98 mmol/L (ref 98–111)
Creatinine, Ser: 2.02 mg/dL — ABNORMAL HIGH (ref 0.44–1.00)
GFR, Estimated: 26 mL/min — ABNORMAL LOW (ref >60)
Glucose, Bld: 110 mg/dL — ABNORMAL HIGH (ref 70–99)
Potassium: 4.8 mmol/L (ref 3.5–5.1)
Sodium: 138 mmol/L (ref 135–145)

## 2020-08-13 MED ORDER — APIXABAN 5 MG PO TABS: 5.0000 mg | ORAL_TABLET | Freq: Two times a day (BID) | ORAL | 3 refills | Status: DC

## 2020-08-13 MED ORDER — TORSEMIDE 20 MG PO TABS: 20.0000 mg | ORAL_TABLET | ORAL | 2 refills | Status: DC

## 2020-08-13 MED ORDER — GABAPENTIN 100 MG PO CAPS: 100.0000 mg | ORAL_CAPSULE | Freq: Every day | ORAL | 3 refills | Status: AC

## 2020-08-13 MED ORDER — AMIODARONE HCL 200 MG PO TABS
200.0000 mg | ORAL_TABLET | Freq: Two times a day (BID) | ORAL | Status: DC
  Administered 2020-08-13: 200 mg via ORAL
  Filled 2020-08-13: qty 1

## 2020-08-13 MED ORDER — AMIODARONE HCL 200 MG PO TABS: 200.0000 mg | ORAL_TABLET | Freq: Every day | ORAL | 3 refills | Status: DC

## 2020-08-13 MED ORDER — NYSTATIN 100000 UNIT/GM EX CREA: 1.0000 "application " | TOPICAL_CREAM | Freq: Two times a day (BID) | CUTANEOUS | 1 refills | Status: DC | PRN

## 2020-08-13 MED ORDER — DIGOXIN 62.5 MCG PO TABS: 0.0625 mg | ORAL_TABLET | Freq: Every day | ORAL | 2 refills | Status: DC

## 2020-08-13 MED ORDER — ALUM & MAG HYDROXIDE-SIMETH 200-200-20 MG/5ML PO SUSP: 30.0000 mL | ORAL | 0 refills | Status: DC | PRN

## 2020-08-13 MED ORDER — TRIAMCINOLONE ACETONIDE 0.025 % EX CREA: 1.0000 "application " | TOPICAL_CREAM | Freq: Two times a day (BID) | CUTANEOUS | 1 refills | Status: DC | PRN

## 2020-08-13 MED ORDER — TORSEMIDE 20 MG PO TABS: 20.0000 mg | ORAL_TABLET | ORAL | Status: DC

## 2020-08-13 NOTE — Progress Notes (Addendum)
Progress Note  Patient Name: Maria Neal Date of Encounter: 08/13/2020  [facility] Cardiologist: Dr. Josefa Half  Subjective   "I'm doing OK", denies CP or SOB, n/c is irritating nose  Inpatient Medications    Scheduled Meds:  amiodarone  200 mg Oral BID   apixaban  5 mg Oral BID   digoxin  0.0625 mg Oral Daily   fluticasone  2 spray Each Nare Daily   gabapentin  100 mg Oral QHS   loratadine  10 mg Oral Daily   pantoprazole  40 mg Oral Daily   sodium chloride flush  3 mL Intravenous Q12H   spironolactone  12.5 mg Oral Daily   Continuous Infusions:  sodium chloride 10 mL/hr at 08/10/20 1234   sodium chloride     PRN Meds: sodium chloride, acetaminophen, albuterol, alum & mag hydroxide-simeth, benzonatate, diclofenac Sodium, ondansetron (ZOFRAN) IV, sodium chloride flush, triamcinolone   Vital Signs    Vitals:   08/12/20 1400 08/12/20 1500 08/12/20 2125 08/13/20 0430  BP:    (!) 99/58  Pulse:    82  Resp:    18  Temp:   97.7 F (36.5 C) (!) 97.5 F (36.4 C)  TempSrc:    Oral  SpO2: 97% 92%    Weight:    87.5 kg  Height:        Intake/Output Summary (Last 24 hours) at 08/13/2020 0811 Last data filed at 08/12/2020 1500 Gross per 24 hour  Intake 183.3 ml  Output --  Net 183.3 ml   Last 3 Weights 08/13/2020 08/12/2020 08/11/2020  Weight (lbs) 192 lb 14.4 oz 192 lb 7.4 oz 193 lb 5.5 oz  Weight (kg) 87.5 kg 87.3 kg 87.7 kg      Telemetry    SR, V paced, PVC burden is improving - Personally Reviewed  ECG    No new EKGs - Personally Reviewed  Physical Exam   GEN: No acute distress.   Neck: No JVD Cardiac: RRR, occ extrasystoles, 1-2/6SM, rubs, or gallops.  Respiratory: decreased at the bases. GI: Soft, nontender, non-distended  MS: trace edema; No deformity. Neuro:  Nonfocal  Psych: Normal affect   Labs    High Sensitivity Troponin:   Recent Labs  Lab 08/07/20 1344 08/07/20 1544  TROPONINIHS 61* 62*      Chemistry Recent Labs  Lab  08/07/20 1241 08/07/20 1302 08/09/20 0038 08/10/20 0203 08/13/20 0132  NA 136   < > 137 136 138  K 5.1   < > 4.8 4.2 4.8  CL 99   < > 99 97* 98  CO2 27   < > 28 29 32  GLUCOSE 112*   < > 115* 137* 110*  BUN 63*   < > 58* 55* 47*  CREATININE 2.50*   < > 2.10* 2.09* 2.02*  CALCIUM 9.2   < > 9.2 9.0 9.4  PROT 6.7  --   --   --   --   ALBUMIN 3.3*  --   --   --   --   AST 16  --   --   --   --   ALT 10  --   --   --   --   ALKPHOS 57  --   --   --   --   BILITOT 1.4*  --   --   --   --   GFRNONAA 20*   < > 25* 25* 26*  ANIONGAP 10   < >  10 10 8    < > = values in this interval not displayed.     Hematology Recent Labs  Lab 08/09/20 0038 08/10/20 0203 08/11/20 0332  WBC 5.8 5.2 5.3  RBC 4.14 4.17 4.03  HGB 11.6* 11.7* 11.3*  HCT 37.9 38.0 37.1  MCV 91.5 91.1 92.1  MCH 28.0 28.1 28.0  MCHC 30.6 30.8 30.5  RDW 17.0* 17.2* 17.4*  PLT 148* 151 153    BNP Recent Labs  Lab 08/07/20 1344  BNP 1,647.8*     DDimer  Recent Labs  Lab 08/08/20 1335  DDIMER 1.21*     Radiology      Cardiac Studies    08/10/20; TEE IMPRESSION:   Limited study due to patient having hypoxia. Poor LV systolic function. EF 20-25 %. Severe TR. MR, AI, PI. Small thrombus or vegetation on ICD wire in RA. No definite LA thrombus.   08/08/20: TTE IMPRESSIONS   1. Left ventricular ejection fraction, by estimation, is 20 to 25%. The  left ventricle has severely decreased function. The left ventricle  demonstrates global hypokinesis. The left ventricular internal cavity size  was severely dilated. Left ventricular  diastolic parameters are indeterminate.   2. Right ventricular systolic function is severely reduced. The right  ventricular size is moderately enlarged. There is severely elevated  pulmonary artery systolic pressure.   3. Left atrial size was severely dilated.   4. Right atrial size was severely dilated.   5. The mitral valve is degenerative. Moderate to severe mitral  valve  regurgitation. Moderate mitral annular calcification.   6. Tricuspid valve regurgitation is severe.   7. The aortic valve is tricuspid. There is moderate calcification of the  aortic valve. There is mild thickening of the aortic valve. Aortic valve  regurgitation is mild to moderate. Mild to moderate aortic valve  sclerosis/calcification is present,  without any evidence of aortic stenosis.   8. There is mild (Grade II) atheroma plaque involving the aortic root and  ascending aorta.   9. The inferior vena cava is dilated in size with <50% respiratory  variability, suggesting right atrial pressure of 15 mmHg.   FINDINGS   Left Ventricle: Left ventricular ejection fraction, by estimation, is 20  to 25%. The left ventricle has severely decreased function. The left  ventricle demonstrates global hypokinesis. The left ventricular internal  cavity size was severely dilated.  There is borderline asymmetric left ventricular hypertrophy of the  posterior segment. Left ventricular diastolic parameters are  indeterminate.   Right Ventricle: The right ventricular size is moderately enlarged. No  increase in right ventricular wall thickness. Right ventricular systolic  function is severely reduced. There is severely elevated pulmonary artery  systolic pressure. The tricuspid  regurgitant velocity is 3.56 m/s, and with an assumed right atrial  pressure of 15 mmHg, the estimated right ventricular systolic pressure is  26.9 mmHg.   Left Atrium: Left atrial size was severely dilated.   Right Atrium: Right atrial size was severely dilated.   Pericardium: There is no evidence of pericardial effusion.   Mitral Valve: The mitral valve is degenerative in appearance. There is  mild thickening of the mitral valve leaflet(s). There is moderate  calcification of the mitral valve leaflet(s). Moderate mitral annular  calcification. Moderate to severe mitral valve  regurgitation.   Tricuspid  Valve: The tricuspid valve is grossly normal. Tricuspid valve  regurgitation is severe.   Aortic Valve: The aortic valve is tricuspid. There is moderate  calcification of the  aortic valve. There is mild thickening of the aortic  valve. There is moderate to severe aortic valve annular calcification.  Aortic valve regurgitation is mild to  moderate. Aortic regurgitation PHT measures 472 msec. Mild to moderate  aortic valve sclerosis/calcification is present, without any evidence of  aortic stenosis. Aortic valve mean gradient measures 3.0 mmHg. Aortic  valve peak gradient measures 6.4 mmHg.  Aortic valve area, by VTI measures 1.26 cm.   Pulmonic Valve: The pulmonic valve was normal in structure. Pulmonic valve  regurgitation is not visualized.   Aorta: The aortic root is normal in size and structure. There is mild  (Grade II) atheroma plaque involving the aortic root and ascending aorta.   Venous: The inferior vena cava is dilated in size with less than 50%  respiratory variability, suggesting right atrial pressure of 15 mmHg.   IAS/Shunts: The atrial septum is grossly normal.   Additional Comments: A device lead is visualized in the right atrium and  right ventricle.      05/30/19: TTE INTERPRETATION  SEVERE LV SYSTOLIC DYSFUNCTION (See above) 42% MILD RV SYSTOLIC DYSFUNCTION (See above)  MODERATE VALVULAR REGURGITATION (See above)  NO VALVULAR STENOSIS  MIODERATe MR, TR  MODERATE PHTN  MILD AR, PR  EF 25%   Patient Profile     70 y.o. female with a hx of NICM, ICD, LBBB, chronic CHF (systolic), HTN, HLD, COPD/chronic bronchitis (on 3L at home), chronic venous insuff. (status post laser ablation of the left great saphenous vein on 02/06/2019), s/p COVID infection approx 4 weeks ago admitted with increased SOB, episodes of confusion, falls, found with acute/chronic CHF, AFib   OUTPATIENT her last cards visit Dr. Josefa Half last note 08/02/20 mentions: Normal coronary anatomy by  cardiac cath 04/14/2014 CRT-D interrogation on 05/18/2020 revealed normal function with predominant atrial sensing with ventricular pacing, 3 episodes of nonsustained VT, 1 episode of atrial tachycardia/atrial fibrillation for 19 days, with a longevity of 9 months   ICD information MDT CRT-D implanted 12/02/2014   Interrogation done this admission Battery is 4 mo to ERI Lead measurements are good AFib since roughly April/May time Optivol markedly elevated VP  (since 2020) 98.1% VSR pacing 1.3% BIVe pacing 99.9%   One monitored VT episode 7 seconds, not printed by industry rep for review, verbally reported by industry to have been 2 years ago Currently in AFib and appeared to be since about May   Assessment & Plan      WCT (12.7seconds),  Suspect this was AF w/RVR aberrancy, she has had a couple NSVT, 4-6 beats PVC burden is improving    2. Persistent AFib This seems a somewhat new Dx, not on a/c out pt CHA2DS2Vasc is 4 This is likely the cause of her acute HF decompensation Heparin gtt here > Eliquis DCCV 08/11/20  Amio gtt started transition to PO amiodarone this AM RECOMMEND amiodarone 200mg  BID x5 days then 200mg  daily      Continue management with attending/cardiology team 3. AKI improving 4. Acute/chronic CHF 5. NICM     Severe VHD by her echo, unfortunately TEE was abreviated 2/2 hypoxia during anesthesia      c/w primary cardiologist here and out patient CRT device was V-V optimized with 12 lead EKG guidance LV lead was note to have intermittent capture as well and LV lead outputs also adjusted. Hopefully this will help her HF management Resume diuretics and BB as per attending cardiologist  6. Few weeks of intermittent confusion, falls MS is markedly  better    Dr. Quentin Ore has seen and examined the patient this morning EP will sign off though remain available EP follow up is arranged Please recall if needed     For questions or updates, please  contact [place] Please consult [email or web address] for contact info under        Signed, Baldwin [place], PA-C  08/13/2020, 8:11 AM

## 2020-08-13 NOTE — TOC Transition Note (Signed)
Transition of Care (TOC) - CM/SW Discharge Note Marvetta Gibbons RN, BSN Transitions of Care Unit 4E- RN Case Manager See Treatment Team for direct phone #  Cross Coverage for [place]  Patient Details  Name: Maria Neal MRN: [id number] Date of Birth: 1950-03-02  Transition of Care [facility]) CM/SW Contact:  Dawayne Patricia, RN Phone Number: 08/13/2020, 5:10 PM   Clinical Narrative:    Pt stable for transition home today, [place] orders placed for RN/PT/aide. Pt already has home 02 in place.   CM spoke with pt at bedside- choice offered for [facility] needs- list provided Per CMS guidelines from medicare.gov website with star ratings (copy placed in shadow chart) pt states she has used [facility] in past and would like to use them again, if unable to accept then pt states she does not have a preference and would defer to [facility] to secure agency.   Family to transport home.   Call made to [facility] with [facility]- referral has been accepted.     Final next level of care: [place] Barriers to Discharge: Barriers Resolved   Patient Goals and CMS Choice Patient states their goals for this hospitalization and ongoing recovery are:: return home CMS Medicare.gov Compare Post Acute Care list provided to:: Patient Choice offered to / list presented to : Patient  Discharge Placement                 Home with [facility]      Discharge Plan and Services   Discharge Planning Services: CM Consult Post Acute Care Choice: Home Health          DME Arranged: N/A         HH Arranged: RN, PT, Nurse's Aide [place]: [place] ([facility]) Date HH Agency Contacted: 08/13/20 Time [place]: [place] Representative spoke with at [place]: [place] ([place]) Interventions     Readmission Risk Interventions No flowsheet data found.

## 2020-08-13 NOTE — Discharge Summary (Signed)
Physician Discharge Summary  Patient ID: Maria Neal MRN: [id number] DOB/AGE: 1950-11-19 70 y.o.  Admit date: 08/07/2020 Discharge date: 08/13/2020  Admission Diagnoses: Acute on chronic systolic left heart failure Dilated cardiomyopathy CAD HTN COPD S/P COVID infection Acute renal failure  Discharge Diagnoses:  Principle problem: Acute on chronic systolic left heart failure Active Problems:   Moderate RV systolic failure   Acute on chronic respiratory failure   COPD   H/O Atrial fibrillation   Non-sustained VT   S/P CRT with optimization   Dilated cardiomyopathy   CAD   Hypertension   S/P COVID infection   Acute renal failure on CKD, IIIa   Severe TR   Moderate to severe MR   Mild AI and PI   Right renal stone, small, non-obstructing  Discharged Condition: stable  Hospital Course: 70 years old white female who is 4 weeks post COVID infection had shortness of breath, leg edema and elevated BNP. She has PMH of CRT, CAD, COPD, cardiomyopathy, depression, HTN, insomnia and restless leg syndrome. She responded to IV furosemide but needed adjustment in medications due to hypotension and excess sleepiness. Her gabapentin dose was reduced by 80 % to 100 mg at bed time only and she was more alert after that.  She had EP consult for her CRT. She was found to be in atrial fibrillation for 4 months. She underwent limited TEE followed by external cardioversion resulting in sinus rhythm. Amiodarone was added for frequent VPCs and VT. CRT devise optimization was done. She needs close follow up due to device battery life of about 4 months. Small dose digoxin was added for moderate to severe RV dysfunction. Patient ambulated some and was alert enough to go home with home PT, nurse and aid. Hospice care was offered in the beginning of admission but with family support she may do  reasonably well for some time. She will see me in 1 week if needed otherwise she will see Primary care, EP doctor  and local cardiology as arranged.  Consults: cardiology  Significant Diagnostic Studies: labs: Normal electrolytes and elevated BUN of 63 and creatinine of 2.50 , gradually improving to 2.10 on day of discharge. Near normal CBC with mildly low Hgb of 11.6 gm. Blood culture negative for 3 days. BNP was 1647.8 pg/ml. SARS 2 positive  EKG: V-paced with VPCs in bigeminy pattern. Post cardioversion, Sinus with V-paced with VPCs.  Echocardiogram: Dilated all 4 chambers with EF of 18-20 %. Severe MR, TR, mild AI and PI.  VQ scan negative for PE.  Limited TEE: Dilated all 4 chambers with EF 20-25 %. No clot in LA appendage. Severe TR. Few strands around pacer wires were seen.  Chest x-ray: No acute disease, positive cardiomegaly.  Abdominal x-ray: Non-obstructing right renal stone otherwise unremarkable.  CT head was negative intracranial injury.  Treatments: cardiac meds: Apixaban, digoxin, amiodarone, spironolactone and prn Torsemide.  EP consult with CRT devise optimization and follow up for low battery life.  Discharge Exam: Blood pressure (!) 108/55, pulse 73, temperature 97.7 F (36.5 C), temperature source Axillary, resp. rate 18, height 5\' 3"  (1.6 m), weight 87.5 kg, SpO2 90 %. General appearance: alert, cooperative and appears stated age. Mild respiratory distress. Head: Normocephalic, atraumatic. Eyes: Blue eyes, pink conjunctiva, corneas clear.   Neck: No adenopathy, no carotid bruit, no JVD, supple, symmetrical, trachea midline and thyroid not enlarged. Resp: Fine basal crackles to auscultation bilaterally. Cardio: Irregular rate and rhythm, S1, S2 normal, III/VI systolic murmur, no click,  rub or gallop. GI: Soft, non-tender; bowel sounds normal; no organomegaly. Extremities: Trace edema, no cyanosis or clubbing. Skin: Warm and dry.  Neurologic: Alert and oriented X 2, normal strength.  Disposition: Discharge disposition: 01-Home or Self Care        Allergies as  of 08/13/2020       Reactions   Levaquin [levofloxacin] Anaphylaxis   Amoxicillin Hives   Did it involve swelling of the face/tongue/throat, SOB, or low BP? No Did it involve sudden or severe rash/hives, skin peeling, or any reaction on the inside of your mouth or nose? No Did you need to seek medical attention at a hospital or doctor's office? No When did it last happen?      10+ years If all above answers are "NO", may proceed with cephalosporin use.   Nitrofuran Derivatives Other (See Comments)   Unknown   Penicillins Other (See Comments)   Thinks it made her itch a lot, but isn't sure. Did it involve swelling of the face/tongue/throat, SOB, or low BP? No Did it involve sudden or severe rash/hives, skin peeling, or any reaction on the inside of your mouth or nose? No Did you need to seek medical attention at a hospital or doctor's office? No When did it last happen?      10+ years If all above answers are "NO", may proceed with cephalosporin use.   Zithromax [azithromycin] Other (See Comments)   Antihistamines, Chlorpheniramine-type Rash        Medication List     STOP taking these medications    aspirin EC 81 MG tablet   carvedilol 25 MG tablet Commonly known as: COREG   furosemide 40 MG tablet Commonly known as: LASIX   losartan 50 MG tablet Commonly known as: COZAAR       TAKE these medications    albuterol 108 (90 Base) MCG/ACT inhaler Commonly known as: VENTOLIN HFA Inhale 2 puffs into the lungs every 6 (six) hours as needed for wheezing or shortness of breath.   alum & mag hydroxide-simeth 200-200-20 MG/5ML suspension Commonly known as: MAALOX/MYLANTA Take 30 mLs by mouth every 4 (four) hours as needed for indigestion or heartburn.   amiodarone 200 MG tablet Commonly known as: PACERONE Take 1 tablet (200 mg total) by mouth daily.   apixaban 5 MG Tabs tablet Commonly known as: ELIQUIS Take 1 tablet (5 mg total) by mouth 2 (two) times daily.    benzonatate 200 MG capsule Commonly known as: TESSALON Take 1 capsule (200 mg total) by mouth 2 (two) times daily as needed for cough.   cyclobenzaprine 5 MG tablet Commonly known as: FLEXERIL TAKE 1 TABLET BY MOUTH 3 TIMES DAILY AS NEEDED What changed: reasons to take this   diclofenac Sodium 1 % Gel Commonly known as: VOLTAREN Apply 2 g topically 2 (two) times daily as needed (pain).   Digoxin 62.5 MCG Tabs Take 0.0625 mg by mouth daily. Start taking on: August 14, 2020   fluticasone 50 MCG/ACT nasal spray Commonly known as: FLONASE Place 2 sprays into both nostrils daily.   gabapentin 100 MG capsule Commonly known as: NEURONTIN Take 1 capsule (100 mg total) by mouth at bedtime. What changed:  medication strength how much to take when to take this   loratadine 10 MG tablet Commonly known as: Claritin Take 1 tablet (10 mg total) by mouth daily.   nystatin cream Commonly known as: MYCOSTATIN Apply 1 application topically 2 (two) times daily as needed for dry skin.  omeprazole 20 MG capsule Commonly known as: PRILOSEC Take 1 capsule (20 mg total) by mouth daily.   OXYGEN Inhale 3 L into the lungs 3 (three) times daily as needed (shortness of breath or coughing).   spironolactone 25 MG tablet Commonly known as: ALDACTONE Take 12.5 mg by mouth daily.   torsemide 20 MG tablet Commonly known as: DEMADEX Take 1 tablet (20 mg total) by mouth once a week. Start taking on: August 16, 2020   Trelegy Ellipta 100-62.5-25 MCG/INH Aepb Generic drug: Fluticasone-Umeclidin-Vilant Inhale 1 puff into the lungs daily.   triamcinolone 0.025 % cream Commonly known as: KENALOG Apply 1 application topically 2 (two) times daily as needed (rash).        Follow-up Information     Vickie Epley, MD Follow up.   Specialties: Cardiology, Radiology Why: 09/01/20 @ 8:20AM Contact information: [place] [address] [phone]          Jon Billings, NP. Schedule an appointment as soon as possible for a visit in 2 week(s).   Specialty: Nurse Practitioner Contact information: [place] [address] [phone]         Dixie Dials, MD. Schedule an appointment as soon as possible for a visit in 1 week(s).   Specialty: Cardiology Why: As needed Contact information: [place] [address] [phone]                 Time spent: Review of old chart, current chart, lab, x-ray, cardiac tests and discussion with patient over 60 minutes.  Signed: Birdie Riddle 08/13/2020, 3:24 PM

## 2020-08-15 DIAGNOSIS — Z7951 Long term (current) use of inhaled steroids: Secondary | ICD-10-CM | POA: Diagnosis not present

## 2020-08-15 DIAGNOSIS — Z87891 Personal history of nicotine dependence: Secondary | ICD-10-CM | POA: Diagnosis not present

## 2020-08-15 DIAGNOSIS — N1831 Chronic kidney disease, stage 3a: Secondary | ICD-10-CM | POA: Diagnosis not present

## 2020-08-15 DIAGNOSIS — I5023 Acute on chronic systolic (congestive) heart failure: Secondary | ICD-10-CM | POA: Diagnosis not present

## 2020-08-15 DIAGNOSIS — Z8616 Personal history of COVID-19: Secondary | ICD-10-CM | POA: Diagnosis not present

## 2020-08-15 DIAGNOSIS — Z9181 History of falling: Secondary | ICD-10-CM | POA: Diagnosis not present

## 2020-08-15 DIAGNOSIS — J449 Chronic obstructive pulmonary disease, unspecified: Secondary | ICD-10-CM | POA: Diagnosis not present

## 2020-08-15 DIAGNOSIS — I130 Hypertensive heart and chronic kidney disease with heart failure and stage 1 through stage 4 chronic kidney disease, or unspecified chronic kidney disease: Secondary | ICD-10-CM | POA: Diagnosis not present

## 2020-08-15 DIAGNOSIS — I447 Left bundle-branch block, unspecified: Secondary | ICD-10-CM | POA: Diagnosis not present

## 2020-08-15 DIAGNOSIS — F32A Depression, unspecified: Secondary | ICD-10-CM | POA: Diagnosis not present

## 2020-08-15 DIAGNOSIS — G4700 Insomnia, unspecified: Secondary | ICD-10-CM | POA: Diagnosis not present

## 2020-08-15 DIAGNOSIS — N200 Calculus of kidney: Secondary | ICD-10-CM | POA: Diagnosis not present

## 2020-08-15 DIAGNOSIS — I420 Dilated cardiomyopathy: Secondary | ICD-10-CM | POA: Diagnosis not present

## 2020-08-15 DIAGNOSIS — I081 Rheumatic disorders of both mitral and tricuspid valves: Secondary | ICD-10-CM | POA: Diagnosis not present

## 2020-08-15 DIAGNOSIS — Z7901 Long term (current) use of anticoagulants: Secondary | ICD-10-CM | POA: Diagnosis not present

## 2020-08-15 DIAGNOSIS — Z9981 Dependence on supplemental oxygen: Secondary | ICD-10-CM | POA: Diagnosis not present

## 2020-08-15 DIAGNOSIS — I4891 Unspecified atrial fibrillation: Secondary | ICD-10-CM | POA: Diagnosis not present

## 2020-08-15 DIAGNOSIS — I2510 Atherosclerotic heart disease of native coronary artery without angina pectoris: Secondary | ICD-10-CM | POA: Diagnosis not present

## 2020-08-15 DIAGNOSIS — G2581 Restless legs syndrome: Secondary | ICD-10-CM | POA: Diagnosis not present

## 2020-08-15 DIAGNOSIS — J9620 Acute and chronic respiratory failure, unspecified whether with hypoxia or hypercapnia: Secondary | ICD-10-CM | POA: Diagnosis not present

## 2020-08-15 LAB — CULTURE, BLOOD (ROUTINE X 2)
Culture: NO GROWTH
Special Requests: ADEQUATE

## 2020-08-16 ENCOUNTER — Telehealth: Payer: Self-pay

## 2020-08-16 DIAGNOSIS — I10 Essential (primary) hypertension: Secondary | ICD-10-CM | POA: Diagnosis not present

## 2020-08-16 DIAGNOSIS — I5022 Chronic systolic (congestive) heart failure: Secondary | ICD-10-CM | POA: Diagnosis not present

## 2020-08-16 DIAGNOSIS — R0602 Shortness of breath: Secondary | ICD-10-CM | POA: Diagnosis not present

## 2020-08-16 DIAGNOSIS — Z95810 Presence of automatic (implantable) cardiac defibrillator: Secondary | ICD-10-CM | POA: Diagnosis not present

## 2020-08-16 DIAGNOSIS — I480 Paroxysmal atrial fibrillation: Secondary | ICD-10-CM | POA: Diagnosis not present

## 2020-08-16 DIAGNOSIS — I428 Other cardiomyopathies: Secondary | ICD-10-CM | POA: Diagnosis not present

## 2020-08-16 DIAGNOSIS — I447 Left bundle-branch block, unspecified: Secondary | ICD-10-CM | POA: Diagnosis not present

## 2020-08-16 DIAGNOSIS — Z9889 Other specified postprocedural states: Secondary | ICD-10-CM | POA: Diagnosis not present

## 2020-08-16 DIAGNOSIS — J410 Simple chronic bronchitis: Secondary | ICD-10-CM | POA: Diagnosis not present

## 2020-08-16 NOTE — Telephone Encounter (Signed)
Transition Care Management Follow-up Telephone Call Date of discharge and from where: 08/13/2020 [facility] How have you been since you were released from the hospital? Feeling just a little better Any questions or concerns? No  Items Reviewed: Did the pt receive and understand the discharge instructions provided? Yes  Medications obtained and verified? Yes  Other? No  Any new allergies since your discharge? No  Dietary orders reviewed? Yes Do you have support at home? Yes   Home Care and Equipment/Supplies: Were home health services ordered? yes If so, what is the name of the agency?   Has the agency set up a time to come to the patient's home? no Were any new equipment or medical supplies ordered?  No What is the name of the medical supply agency? N/a Were you able to get the supplies/equipment? not applicable Do you have any questions related to the use of the equipment or supplies? No  Functional Questionnaire: (I = Independent and D = Dependent) ADLs: I  Bathing/Dressing- I  Meal Prep- I  Eating- I  Maintaining continence- I  Transferring/Ambulation- I  Managing Meds- I  Follow up appointments reviewed:  PCP Hospital f/u appt confirmed? No  Will have daughter call back to schedule appointment.e having trouble with transportation. Are transportation arrangements needed? No  If their condition worsens, is the pt aware to call PCP or go to the Emergency Dept.? Yes Was the patient provided with contact information for the PCP's office or ED? Yes Was to pt encouraged to call back with questions or concerns? Yes

## 2020-08-17 DIAGNOSIS — J449 Chronic obstructive pulmonary disease, unspecified: Secondary | ICD-10-CM | POA: Diagnosis not present

## 2020-08-17 DIAGNOSIS — N200 Calculus of kidney: Secondary | ICD-10-CM | POA: Diagnosis not present

## 2020-08-17 DIAGNOSIS — I420 Dilated cardiomyopathy: Secondary | ICD-10-CM | POA: Diagnosis not present

## 2020-08-17 DIAGNOSIS — I081 Rheumatic disorders of both mitral and tricuspid valves: Secondary | ICD-10-CM | POA: Diagnosis not present

## 2020-08-17 DIAGNOSIS — Z87891 Personal history of nicotine dependence: Secondary | ICD-10-CM | POA: Diagnosis not present

## 2020-08-17 DIAGNOSIS — N1831 Chronic kidney disease, stage 3a: Secondary | ICD-10-CM | POA: Diagnosis not present

## 2020-08-17 DIAGNOSIS — Z7951 Long term (current) use of inhaled steroids: Secondary | ICD-10-CM | POA: Diagnosis not present

## 2020-08-17 DIAGNOSIS — Z7901 Long term (current) use of anticoagulants: Secondary | ICD-10-CM | POA: Diagnosis not present

## 2020-08-17 DIAGNOSIS — I5022 Chronic systolic (congestive) heart failure: Secondary | ICD-10-CM | POA: Diagnosis not present

## 2020-08-17 DIAGNOSIS — I130 Hypertensive heart and chronic kidney disease with heart failure and stage 1 through stage 4 chronic kidney disease, or unspecified chronic kidney disease: Secondary | ICD-10-CM | POA: Diagnosis not present

## 2020-08-17 DIAGNOSIS — Z9181 History of falling: Secondary | ICD-10-CM | POA: Diagnosis not present

## 2020-08-17 DIAGNOSIS — F32A Depression, unspecified: Secondary | ICD-10-CM | POA: Diagnosis not present

## 2020-08-17 DIAGNOSIS — J9620 Acute and chronic respiratory failure, unspecified whether with hypoxia or hypercapnia: Secondary | ICD-10-CM | POA: Diagnosis not present

## 2020-08-17 DIAGNOSIS — Z8616 Personal history of COVID-19: Secondary | ICD-10-CM | POA: Diagnosis not present

## 2020-08-17 DIAGNOSIS — I447 Left bundle-branch block, unspecified: Secondary | ICD-10-CM | POA: Diagnosis not present

## 2020-08-17 DIAGNOSIS — G4700 Insomnia, unspecified: Secondary | ICD-10-CM | POA: Diagnosis not present

## 2020-08-17 DIAGNOSIS — I5023 Acute on chronic systolic (congestive) heart failure: Secondary | ICD-10-CM | POA: Diagnosis not present

## 2020-08-17 DIAGNOSIS — I4891 Unspecified atrial fibrillation: Secondary | ICD-10-CM | POA: Diagnosis not present

## 2020-08-17 DIAGNOSIS — I2510 Atherosclerotic heart disease of native coronary artery without angina pectoris: Secondary | ICD-10-CM | POA: Diagnosis not present

## 2020-08-17 DIAGNOSIS — G2581 Restless legs syndrome: Secondary | ICD-10-CM | POA: Diagnosis not present

## 2020-08-17 DIAGNOSIS — Z9981 Dependence on supplemental oxygen: Secondary | ICD-10-CM | POA: Diagnosis not present

## 2020-08-18 DIAGNOSIS — R0609 Other forms of dyspnea: Secondary | ICD-10-CM | POA: Diagnosis not present

## 2020-08-18 DIAGNOSIS — J449 Chronic obstructive pulmonary disease, unspecified: Secondary | ICD-10-CM | POA: Diagnosis not present

## 2020-08-18 DIAGNOSIS — Z9981 Dependence on supplemental oxygen: Secondary | ICD-10-CM | POA: Diagnosis not present

## 2020-08-19 ENCOUNTER — Telehealth: Payer: Self-pay

## 2020-08-19 DIAGNOSIS — Z01818 Encounter for other preprocedural examination: Secondary | ICD-10-CM | POA: Diagnosis not present

## 2020-08-19 NOTE — Telephone Encounter (Signed)
Left detailed message for Wes from PT at [facility] providing him with the OK for verbal per his request. Advised to give our office a call back if they have any questions or concerns.

## 2020-08-19 NOTE — Telephone Encounter (Signed)
OK for verbal orders?

## 2020-08-19 NOTE — Telephone Encounter (Signed)
Copied from [place] [id number]. Topic: General - Inquiry >> Aug 19, 2020 11:14 AM On[place] Rack wrote: Caller name: Wes Relation to pt: PT from [facility]  Call back number: [phone]    Reason for call:  Verbal PT orders for  2x 6, 1x 3   Routing to provider for verbal orders.

## 2020-08-20 ENCOUNTER — Encounter: Payer: Self-pay | Admitting: Family Medicine

## 2020-08-20 ENCOUNTER — Other Ambulatory Visit: Payer: Self-pay

## 2020-08-20 ENCOUNTER — Ambulatory Visit (INDEPENDENT_AMBULATORY_CARE_PROVIDER_SITE_OTHER): Payer: Medicare Other | Admitting: Family Medicine

## 2020-08-20 VITALS — BP 131/73 | HR 79 | Temp 97.8°F | Ht 62.99 in | Wt 193.0 lb

## 2020-08-20 DIAGNOSIS — Z87891 Personal history of nicotine dependence: Secondary | ICD-10-CM | POA: Diagnosis not present

## 2020-08-20 DIAGNOSIS — G2581 Restless legs syndrome: Secondary | ICD-10-CM | POA: Diagnosis not present

## 2020-08-20 DIAGNOSIS — J449 Chronic obstructive pulmonary disease, unspecified: Secondary | ICD-10-CM | POA: Diagnosis not present

## 2020-08-20 DIAGNOSIS — I447 Left bundle-branch block, unspecified: Secondary | ICD-10-CM | POA: Diagnosis not present

## 2020-08-20 DIAGNOSIS — Z7901 Long term (current) use of anticoagulants: Secondary | ICD-10-CM | POA: Insufficient documentation

## 2020-08-20 DIAGNOSIS — Z8616 Personal history of COVID-19: Secondary | ICD-10-CM | POA: Diagnosis not present

## 2020-08-20 DIAGNOSIS — Z4502 Encounter for adjustment and management of automatic implantable cardiac defibrillator: Secondary | ICD-10-CM | POA: Diagnosis not present

## 2020-08-20 DIAGNOSIS — I2510 Atherosclerotic heart disease of native coronary artery without angina pectoris: Secondary | ICD-10-CM | POA: Diagnosis not present

## 2020-08-20 DIAGNOSIS — I130 Hypertensive heart and chronic kidney disease with heart failure and stage 1 through stage 4 chronic kidney disease, or unspecified chronic kidney disease: Secondary | ICD-10-CM | POA: Diagnosis not present

## 2020-08-20 DIAGNOSIS — J9620 Acute and chronic respiratory failure, unspecified whether with hypoxia or hypercapnia: Secondary | ICD-10-CM

## 2020-08-20 DIAGNOSIS — I5023 Acute on chronic systolic (congestive) heart failure: Secondary | ICD-10-CM

## 2020-08-20 DIAGNOSIS — N200 Calculus of kidney: Secondary | ICD-10-CM | POA: Diagnosis not present

## 2020-08-20 DIAGNOSIS — I4891 Unspecified atrial fibrillation: Secondary | ICD-10-CM | POA: Diagnosis not present

## 2020-08-20 DIAGNOSIS — Z9981 Dependence on supplemental oxygen: Secondary | ICD-10-CM | POA: Diagnosis not present

## 2020-08-20 DIAGNOSIS — N1831 Chronic kidney disease, stage 3a: Secondary | ICD-10-CM | POA: Diagnosis not present

## 2020-08-20 DIAGNOSIS — G4700 Insomnia, unspecified: Secondary | ICD-10-CM | POA: Diagnosis not present

## 2020-08-20 DIAGNOSIS — Z9181 History of falling: Secondary | ICD-10-CM | POA: Diagnosis not present

## 2020-08-20 DIAGNOSIS — I420 Dilated cardiomyopathy: Secondary | ICD-10-CM | POA: Diagnosis not present

## 2020-08-20 DIAGNOSIS — I081 Rheumatic disorders of both mitral and tricuspid valves: Secondary | ICD-10-CM | POA: Diagnosis not present

## 2020-08-20 DIAGNOSIS — Z7951 Long term (current) use of inhaled steroids: Secondary | ICD-10-CM | POA: Diagnosis not present

## 2020-08-20 DIAGNOSIS — F32A Depression, unspecified: Secondary | ICD-10-CM | POA: Diagnosis not present

## 2020-08-20 NOTE — Progress Notes (Signed)
BP 131/73   Pulse 79   Temp 97.8 F (36.6 C) (Oral)   Ht 5' 2.99" (1.6 m)   Wt 193 lb (87.5 kg)   SpO2 96% Comment: on 2L oxygen  BMI 34.20 kg/m    Subjective:    Patient ID: Maria Neal, female    DOB: 04-24-1950, 70 y.o.   MRN: [id number]  HPI: [name] is a 70 y.o. female  Chief Complaint  Patient presents with   Hospitalization Follow-up    Was at [facility] x 1 week for heart failure  Having a defibrillator place next week   Transition of [facility] Follow up.   Hospital[facility]ility: [facility] Physician: Dr. 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 Active Problems:   Moderate RV systolic failure   Acute on chronic respiratory failure   COPD   H/O Atrial fibrillation   Non-sustained VT   S/P CRT with optimization   Dilated cardiomyopathy   CAD   Hypertension   S/P COVID infection   Acute renal failure on CKD, IIIa   Severe TR   Moderate to severe MR   Mild AI and PI   Right renal stone, small, non-obstructing  Date of interactive Contact within 48 hours of discharge:  08/16/20 Contact was through: phone  Date of 7 day or 14 day face-to-face visit:   08/20/20  within 7 days  Outpatient Encounter Medications as of 08/20/2020  Medication Sig   albuterol (PROVENTIL HFA;VENTOLIN HFA) 108 (90 Base) MCG/ACT inhaler Inhale 2 puffs into the lungs every 6 (six) hours as needed for wheezing or shortness of breath.   alum & mag hydroxide-simeth (MAALOX/MYLANTA) 200-200-20 MG/5ML suspension Take 30 mLs by mouth every 4 (four) hours as needed for indigestion or heartburn.   amiodarone (PACERONE) 200 MG tablet Take 1 tablet (200 mg total) by mouth daily.   apixaban (ELIQUIS) 5 MG TABS tablet Take 1 tablet (5 mg total) by mouth 2 (two) times daily.   cyclobenzaprine (FLEXERIL) 5 MG tablet TAKE 1 TABLET  BY MOUTH 3 TIMES DAILY AS NEEDED (Patient taking differently: Take 5 mg by mouth 3 (three) times daily as needed for muscle spasms.)   digoxin 62.5 MCG TABS Take 0.0625 mg by mouth daily.   fluticasone (FLONASE) 50 MCG/ACT nasal spray Place 2 sprays into both nostrils daily.   Fluticasone-Umeclidin-Vilant (TRELEGY ELLIPTA) 100-62.5-25 MCG/INH AEPB Inhale 1 puff into the lungs daily.   gabapentin (NEURONTIN) 100 MG capsule Take 1 capsule (100 mg total) by mouth at bedtime.   loratadine (CLARITIN) 10 MG tablet Take 1 tablet (10 mg total) by mouth daily.   nystatin cream (MYCOSTATIN) Apply 1 application topically 2 (two) times daily as needed for dry skin.   omeprazole (PRILOSEC) 20 MG capsule Take 1 capsule (20 mg total) by mouth daily.   OXYGEN Inhale 3 L into the lungs 3 (three) times daily as needed (shortness of breath or coughing).   spironolactone (ALDACTONE) 25 MG tablet Take 12.5 mg by mouth daily.   torsemide (DEMADEX) 20 MG tablet Take 1 tablet (20 mg total) by mouth once a week.   triamcinolone (KENALOG) 0.025 % cream Apply 1 application topically 2 (two) times daily as needed (rash).   benzonatate (TESSALON) 200 MG capsule Take 1 capsule (200 mg total) by mouth 2 (two) times daily as needed for cough. (Patient not  taking: Reported on 08/20/2020)   carvedilol (COREG) 12.5 MG tablet Take 12.5 mg by mouth 2 (two) times daily.   diclofenac Sodium (VOLTAREN) 1 % GEL Apply 2 g topically 2 (two) times daily as needed (pain). (Patient not taking: Reported on 08/20/2020)   No facility-administered encounter medications on file as of 08/20/2020.  Per hospitalist: "Hospital Course: 70 years old white female who is 4 weeks post COVID infection had shortness of breath, leg edema and elevated BNP. She has PMH of CRT, CAD, COPD, cardiomyopathy, depression, HTN, insomnia and restless leg syndrome. She responded to IV furosemide but needed adjustment in medications due to hypotension and excess sleepiness.  Her gabapentin dose was reduced by 80 % to 100 mg at bed time only and she was more alert after that.  She had EP consult for her CRT. She was found to be in atrial fibrillation for 4 months. She underwent limited TEE followed by external cardioversion resulting in sinus rhythm. Amiodarone was added for frequent VPCs and VT. CRT devise optimization was done. She needs close follow up due to device battery life of about 4 months. Small dose digoxin was added for moderate to severe RV dysfunction. Patient ambulated some and was alert enough to go home with home PT, nurse and aid. Hospice care was offered in the beginning of admission but with family support she may do  reasonably well for some time. She will see me in 1 week if needed otherwise she will see Primary care, EP doctor and local cardiology as arranged."  Diagnostic Tests Reviewed: CLINICAL DATA:  Neuro deficit, acute, stroke suspected Slurred speech x2 weeks. Two recent falls with head injury. Right headache   EXAM: CT HEAD WITHOUT CONTRAST   TECHNIQUE: Contiguous axial images were obtained from the base of the skull through the vertex without intravenous contrast.   COMPARISON:  None.   FINDINGS: Brain: Normal anatomic configuration. Parenchymal volume loss is commensurate with the patient's age. Mild periventricular white matter changes are present likely reflecting the sequela of small vessel ischemia. No abnormal intra or extra-axial mass lesion or fluid collection. No abnormal mass effect or midline shift. No evidence of acute intracranial hemorrhage or infarct. Ventricular size is normal. Cerebellum unremarkable.   Vascular: No asymmetric hyperdense vasculature at the skull base.   Skull: Intact   Sinuses/Orbits: There is moderate mucosal thickening within the right maxillary sinus. Mild thickening within the left maxillary sinus and left frontal sinus. No air-fluid levels. Remaining paranasal sinuses are clear. The  orbits are unremarkable.   Other: Mastoid air cells and middle ear cavities are clear.   IMPRESSION: No acute intracranial injury.  No calvarial fracture.   Mild senescent change.   Mild bilateral paranasal sinus disease.  CLINICAL DATA:  Suspected stroke. Injury to LOWER leg with oxygen tank on 08/02/2020. Legs are red and swollen.   EXAM: PORTABLE LEFT TIBIA AND FIBULA - 2 VIEW   COMPARISON:  None.   FINDINGS: No acute fracture or subluxation. Atherosclerotic calcification present. Diffuse soft tissue edema. Note is made of small plantar calcaneal spur.   IMPRESSION: Diffuse soft tissue swelling. No evidence for acute osseous abnormality.  CLINICAL DATA:  Acute systolic heart failure   EXAM: PORTABLE CHEST 1 VIEW   COMPARISON:  03/14/2019   FINDINGS: Cardiomegaly. Left pacer/AICD remains in place, unchanged. No overt edema. No confluent opacities or effusions.   IMPRESSION: No acute cardiopulmonary disease.  Cardiomegaly.  CLINICAL DATA:  Pulmonary emboli suspected, high probability, shortness of  breath.   EXAM: NUCLEAR MEDICINE PERFUSION LUNG SCAN   TECHNIQUE: Perfusion images were obtained in multiple projections after intravenous injection of radiopharmaceutical.   Ventilation scans intentionally deferred if perfusion scan and chest x-ray adequate for interpretation during COVID 19 epidemic.   RADIOPHARMACEUTICALS:  4.3 mCi Tc-70m MAA IV   COMPARISON:  Chest radiography 2 days ago.   FINDINGS: No perfusion defect. Photopenia related in some projections to overlying pacemaker. Large cardiac defect.   IMPRESSION: No perfusion defects to suggest pulmonary emboli. Enlarged heart. Photopenia on some projections related to the pacemaker battery.  CLINICAL DATA:  Abdominal for several months, initial encounter   EXAM: ABDOMEN - 2 VIEW   COMPARISON:  None.   FINDINGS: Scattered large and small bowel gas is noted. No obstructive changes are  seen. No significant constipation is noted. Postsurgical changes are seen. No free air is noted. Small right renal calculus is noted. No acute bony abnormality is noted.   IMPRESSION: Nonobstructing right renal stone.   No other focal abnormality is noted.  Disposition: Home  Consults:  Cardiology  Discharge Instructions:  Follow up here and with cardiology  Disease/illness Education: Discussed today  Home Health/Community Services Discussions/Referrals: In place  Establishment or re-establishment of referral orders for community resources: In place  Discussion with other health care providers: N/A  Assessment and Support of treatment regimen adherence: Good  Appointments Coordinated with: Patient and brother  Education for self-management, independent living, and ADLs: Discussed today  Since getting out of the hospital, Nakeita has been feeling all right. She denies any SOB. No wheezing. +swelling in her legs. She is tired. She is looking forward to getting her pacemaker replaced. She saw her cardiologist on Monday (5 days ago)- he restarted her on her carvedilol 12.5mg  BID and interrogated her CRT-D. He wants to see her in 6 weeks. He is to do a procedure on her pacemaker on 08/25/20. She saw her pumonologist on Tuesday (4 days ago) he noted that she was stable and did not change anything.   Relevant past medical, surgical, family and social history reviewed and updated as indicated. Interim medical history since our last visit reviewed. Allergies and medications reviewed and updated.  Review of Systems  Constitutional: Negative.   HENT: Negative.    Respiratory: Negative.    Cardiovascular:  Positive for leg swelling.  Gastrointestinal: Negative.   Genitourinary: Negative.  Negative for decreased urine volume, difficulty urinating, dyspareunia, dysuria, enuresis, flank pain, frequency, genital sores, hematuria, menstrual problem, pelvic pain, urgency, vaginal bleeding, vaginal  discharge and vaginal pain.  Musculoskeletal: Negative.   Psychiatric/Behavioral: Negative.     Per HPI unless specifically indicated above     Objective:    BP 131/73   Pulse 79   Temp 97.8 F (36.6 C) (Oral)   Ht 5' 2.99" (1.6 m)   Wt 193 lb (87.5 kg)   SpO2 96% Comment: on 2L oxygen  BMI 34.20 kg/m   Wt Readings from Last 3 Encounters:  08/20/20 193 lb (87.5 kg)  08/13/20 192 lb 14.4 oz (87.5 kg)  06/25/20 187 lb 2 oz (84.9 kg)    Physical Exam Vitals and nursing note reviewed.  Constitutional:      General: She is not in acute distress.    Appearance: Normal appearance. She is not ill-appearing, toxic-appearing or diaphoretic.  HENT:     Head: Normocephalic and atraumatic.     Right Ear: External ear normal.     Left Ear: External ear normal.  Nose: Nose normal.     Mouth/Throat:     Mouth: Mucous membranes are moist.     Pharynx: Oropharynx is clear.  Eyes:     General: No scleral icterus.       Right eye: No discharge.        Left eye: No discharge.     Extraocular Movements: Extraocular movements intact.     Conjunctiva/sclera: Conjunctivae normal.     Pupils: Pupils are equal, round, and reactive to light.  Cardiovascular:     Rate and Rhythm: Normal rate and regular rhythm.     Pulses: Normal pulses.     Heart sounds: Murmur heard.    No friction rub. No gallop.  Pulmonary:     Effort: Pulmonary effort is normal. No respiratory distress.     Breath sounds: Normal breath sounds. No stridor. No wheezing, rhonchi or rales.  Chest:     Chest wall: No tenderness.  Musculoskeletal:        General: Normal range of motion.     Cervical back: Normal range of motion and neck supple.  Skin:    General: Skin is warm and dry.     Capillary Refill: Capillary refill takes less than 2 seconds.     Coloration: Skin is not jaundiced or pale.     Findings: No bruising, erythema, lesion or rash.  Neurological:     General: No focal deficit present.     Mental  Status: She is alert and oriented to person, place, and time. Mental status is at baseline.  Psychiatric:        Mood and Affect: Mood normal.        Behavior: Behavior normal.        Thought Content: Thought content normal.        Judgment: Judgment normal.    Results for orders placed or performed during the hospital encounter of 08/07/20  Resp Panel by RT-PCR (Flu A&B, Covid) Nasopharyngeal Swab   Specimen: Nasopharyngeal Swab; Nasopharyngeal(NP) swabs in vial transport medium  Result Value Ref Range   SARS Coronavirus 2 by RT PCR POSITIVE (A) NEGATIVE   Influenza A by PCR NEGATIVE NEGATIVE   Influenza B by PCR NEGATIVE NEGATIVE  MRSA Next Gen by PCR, Nasal   Specimen: Nasal Mucosa; Nasal Swab  Result Value Ref Range   MRSA by PCR Next Gen NOT DETECTED NOT DETECTED  Culture, blood (Routine X 2) w Reflex to ID Panel   Specimen: BLOOD  Result Value Ref Range   Specimen Description BLOOD LEFT ANTECUBITAL    Special Requests      BOTTLES DRAWN AEROBIC AND ANAEROBIC Blood Culture adequate volume   Culture      NO GROWTH 5 DAYS Performed at [facility], [place] [address], [place], [place] [address]    Report Status 08/15/2020 FINAL   Protime-INR  Result Value Ref Range   Prothrombin Time 15.4 (H) 11.4 - 15.2 seconds   INR 1.2 0.8 - 1.2  APTT  Result Value Ref Range   aPTT 33 24 - 36 seconds  CBC  Result Value Ref Range   WBC 4.5 4.0 - 10.5 K/uL   RBC 4.21 3.87 - 5.11 MIL/uL   Hemoglobin 11.6 (L) 12.0 - 15.0 g/dL   HCT 38.4 36.0 - 46.0 %   MCV 91.2 80.0 - 100.0 fL   MCH 27.6 26.0 - 34.0 pg   MCHC 30.2 30.0 - 36.0 g/dL   RDW 16.9 (H) 11.5 -  15.5 %   Platelets 151 150 - 400 K/uL   nRBC 0.0 0.0 - 0.2 %  Differential  Result Value Ref Range   Neutrophils Relative % 58 %   Neutro Abs 2.6 1.7 - 7.7 K/uL   Lymphocytes Relative 24 %   Lymphs Abs 1.1 0.7 - 4.0 K/uL   Monocytes Relative 14 %   Monocytes Absolute 0.6 0.1 - 1.0 K/uL   Eosinophils Relative 3 %    Eosinophils Absolute 0.1 0.0 - 0.5 K/uL   Basophils Relative 1 %   Basophils Absolute 0.0 0.0 - 0.1 K/uL   Immature Granulocytes 0 %   Abs Immature Granulocytes 0.01 0.00 - 0.07 K/uL  Comprehensive metabolic panel  Result Value Ref Range   Sodium 136 135 - 145 mmol/L   Potassium 5.1 3.5 - 5.1 mmol/L   Chloride 99 98 - 111 mmol/L   CO2 27 22 - 32 mmol/L   Glucose, Bld 112 (H) 70 - 99 mg/dL   BUN 63 (H) 8 - 23 mg/dL   Creatinine, Ser 2.50 (H) 0.44 - 1.00 mg/dL   Calcium 9.2 8.9 - 10.3 mg/dL   Total Protein 6.7 6.5 - 8.1 g/dL   Albumin 3.3 (L) 3.5 - 5.0 g/dL   AST 16 15 - 41 U/L   ALT 10 0 - 44 U/L   Alkaline Phosphatase 57 38 - 126 U/L   Total Bilirubin 1.4 (H) 0.3 - 1.2 mg/dL   GFR, Estimated 20 (L) >60 mL/min   Anion gap 10 5 - 15  Brain natriuretic peptide  Result Value Ref Range   B Natriuretic Peptide 1,647.8 (H) 0.0 - 100.0 pg/mL  CBC  Result Value Ref Range   WBC 4.5 4.0 - 10.5 K/uL   RBC 4.15 3.87 - 5.11 MIL/uL   Hemoglobin 11.8 (L) 12.0 - 15.0 g/dL   HCT 37.7 36.0 - 46.0 %   MCV 90.8 80.0 - 100.0 fL   MCH 28.4 26.0 - 34.0 pg   MCHC 31.3 30.0 - 36.0 g/dL   RDW 16.7 (H) 11.5 - 15.5 %   Platelets 143 (L) 150 - 400 K/uL   nRBC 0.0 0.0 - 0.2 %  Creatinine, serum  Result Value Ref Range   Creatinine, Ser 2.55 (H) 0.44 - 1.00 mg/dL   GFR, Estimated 20 (L) >60 mL/min  HIV Antibody (routine testing w rflx)  Result Value Ref Range   HIV Screen 4th Generation wRfx Non Reactive Non Reactive  Basic metabolic panel  Result Value Ref Range   Sodium 138 135 - 145 mmol/L   Potassium 4.7 3.5 - 5.1 mmol/L   Chloride 100 98 - 111 mmol/L   CO2 28 22 - 32 mmol/L   Glucose, Bld 101 (H) 70 - 99 mg/dL   BUN 61 (H) 8 - 23 mg/dL   Creatinine, Ser 2.36 (H) 0.44 - 1.00 mg/dL   Calcium 9.0 8.9 - 10.3 mg/dL   GFR, Estimated 22 (L) >60 mL/min   Anion gap 10 5 - 15  Blood gas, arterial  Result Value Ref Range   FIO2 36.00    pH, Arterial 7.369 7.350 - 7.450   pCO2 arterial 47.9  32.0 - 48.0 mmHg   pO2, Arterial 81.3 (L) 83.0 - 108.0 mmHg   Bicarbonate 27.0 20.0 - 28.0 mmol/L   Acid-Base Excess 2.2 (H) 0.0 - 2.0 mmol/L   O2 Saturation 96.1 %   Patient temperature 37.0    Collection site RIGHT RADIAL    Drawn  by [phone]    Sample type ARTERIAL    Allens test (pass/fail) PASS PASS  D-dimer, quantitative  Result Value Ref Range   D-Dimer, Quant 1.21 (H) 0.00 - 0.50 ug/mL-FEU  C-reactive protein  Result Value Ref Range   CRP 0.8 <1.0 mg/dL  Procalcitonin - Baseline  Result Value Ref Range   Procalcitonin <0.10 ng/mL  Heparin level (unfractionated)  Result Value Ref Range   Heparin Unfractionated 0.85 (H) 0.30 - 0.70 IU/mL  Basic metabolic panel  Result Value Ref Range   Sodium 137 135 - 145 mmol/L   Potassium 4.8 3.5 - 5.1 mmol/L   Chloride 99 98 - 111 mmol/L   CO2 28 22 - 32 mmol/L   Glucose, Bld 115 (H) 70 - 99 mg/dL   BUN 58 (H) 8 - 23 mg/dL   Creatinine, Ser 2.10 (H) 0.44 - 1.00 mg/dL   Calcium 9.2 8.9 - 10.3 mg/dL   GFR, Estimated 25 (L) >60 mL/min   Anion gap 10 5 - 15  Magnesium  Result Value Ref Range   Magnesium 2.4 1.7 - 2.4 mg/dL  Procalcitonin  Result Value Ref Range   Procalcitonin <0.10 ng/mL  CBC  Result Value Ref Range   WBC 5.8 4.0 - 10.5 K/uL   RBC 4.14 3.87 - 5.11 MIL/uL   Hemoglobin 11.6 (L) 12.0 - 15.0 g/dL   HCT 37.9 36.0 - 46.0 %   MCV 91.5 80.0 - 100.0 fL   MCH 28.0 26.0 - 34.0 pg   MCHC 30.6 30.0 - 36.0 g/dL   RDW 17.0 (H) 11.5 - 15.5 %   Platelets 148 (L) 150 - 400 K/uL   nRBC 0.3 (H) 0.0 - 0.2 %  Heparin level (unfractionated)  Result Value Ref Range   Heparin Unfractionated 1.02 (H) 0.30 - 0.70 IU/mL  Heparin level (unfractionated)  Result Value Ref Range   Heparin Unfractionated 0.80 (H) 0.30 - 0.70 IU/mL  Basic metabolic panel  Result Value Ref Range   Sodium 136 135 - 145 mmol/L   Potassium 4.2 3.5 - 5.1 mmol/L   Chloride 97 (L) 98 - 111 mmol/L   CO2 29 22 - 32 mmol/L   Glucose, Bld 137 (H) 70 - 99 mg/dL    BUN 55 (H) 8 - 23 mg/dL   Creatinine, Ser 2.09 (H) 0.44 - 1.00 mg/dL   Calcium 9.0 8.9 - 10.3 mg/dL   GFR, Estimated 25 (L) >60 mL/min   Anion gap 10 5 - 15  Procalcitonin  Result Value Ref Range   Procalcitonin 0.16 ng/mL  Heparin level (unfractionated)  Result Value Ref Range   Heparin Unfractionated 0.55 0.30 - 0.70 IU/mL  CBC  Result Value Ref Range   WBC 5.2 4.0 - 10.5 K/uL   RBC 4.17 3.87 - 5.11 MIL/uL   Hemoglobin 11.7 (L) 12.0 - 15.0 g/dL   HCT 38.0 36.0 - 46.0 %   MCV 91.1 80.0 - 100.0 fL   MCH 28.1 26.0 - 34.0 pg   MCHC 30.8 30.0 - 36.0 g/dL   RDW 17.2 (H) 11.5 - 15.5 %   Platelets 151 150 - 400 K/uL   nRBC 1.1 (H) 0.0 - 0.2 %  CBC  Result Value Ref Range   WBC 5.3 4.0 - 10.5 K/uL   RBC 4.03 3.87 - 5.11 MIL/uL   Hemoglobin 11.3 (L) 12.0 - 15.0 g/dL   HCT 37.1 36.0 - 46.0 %   MCV 92.1 80.0 - 100.0 fL   MCH 28.0 26.0 -  34.0 pg   MCHC 30.5 30.0 - 36.0 g/dL   RDW 17.4 (H) 11.5 - 15.5 %   Platelets 153 150 - 400 K/uL   nRBC 0.8 (H) 0.0 - 0.2 %  Basic metabolic panel  Result Value Ref Range   Sodium 138 135 - 145 mmol/L   Potassium 4.8 3.5 - 5.1 mmol/L   Chloride 98 98 - 111 mmol/L   CO2 32 22 - 32 mmol/L   Glucose, Bld 110 (H) 70 - 99 mg/dL   BUN 47 (H) 8 - 23 mg/dL   Creatinine, Ser 2.02 (H) 0.44 - 1.00 mg/dL   Calcium 9.4 8.9 - 10.3 mg/dL   GFR, Estimated 26 (L) >60 mL/min   Anion gap 8 5 - 15  I-stat chem 8, ED  Result Value Ref Range   Sodium 136 135 - 145 mmol/L   Potassium 5.0 3.5 - 5.1 mmol/L   Chloride 99 98 - 111 mmol/L   BUN 57 (H) 8 - 23 mg/dL   Creatinine, Ser 2.40 (H) 0.44 - 1.00 mg/dL   Glucose, Bld 113 (H) 70 - 99 mg/dL   Calcium, Ion 1.11 (L) 1.15 - 1.40 mmol/L   TCO2 29 22 - 32 mmol/L   Hemoglobin 13.3 12.0 - 15.0 g/dL   HCT 39.0 36.0 - 46.0 %  CBG monitoring, ED  Result Value Ref Range   Glucose-Capillary 115 (H) 70 - 99 mg/dL   Comment 1 Notify RN   ECHOCARDIOGRAM COMPLETE  Result Value Ref Range   Height 63 in   BP 97/57  mmHg   AV Area VTI 1.26 cm2   AV Mean grad 3.0 mmHg   S' Lateral 6.40 cm   P 1/2 time 472 msec   AR max vel 1.18 cm2   AV Peak grad 6.4 mmHg   Ao pk vel 1.26 m/s   MV M vel 4.74 m/s   Radius 0.90 cm   AV Area mean vel 1.12 cm2   MV Peak grad 89.9 mmHg  Troponin I (High Sensitivity)  Result Value Ref Range   Troponin I (High Sensitivity) 61 (H) <18 ng/L  Troponin I (High Sensitivity)  Result Value Ref Range   Troponin I (High Sensitivity) 62 (H) <18 ng/L      Assessment & Plan:   Problem List Items Addressed This Visit       Respiratory   COPD, severe ([place])    Stable. Following with pulmonology. Continue to monitor.        Other   Current use of long term anticoagulation    Labs checked a week ago were stable. Continue to monitor.       Other Visit Diagnoses     Acute on chronic systolic congestive heart failure ([place])    -  Primary   Following with cardiology. Mild swelling today. Doing well on O2. Continue to follow with cardiology. Call with any concerns.    Relevant Medications   carvedilol (COREG) 12.5 MG tablet   Acute on chronic respiratory failure, unspecified whether with hypoxia or hypercapnia ([place])       Doing well. Stable on oxygen. Continue to monitor.    End of battery life of cardiac resynchronization therapy defibrillator (CRT-D)       To have it replaced next week. Continue to follow with cardiology. Call with any concerns.         Follow up plan: Return in about 6 weeks (around 10/01/2020) for with PCP.

## 2020-08-20 NOTE — Assessment & Plan Note (Signed)
Stable. Following with pulmonology. Continue to monitor.

## 2020-08-20 NOTE — Assessment & Plan Note (Addendum)
Labs checked a week ago were stable. Continue to monitor.

## 2020-08-25 DIAGNOSIS — Z45018 Encounter for adjustment and management of other part of cardiac pacemaker: Secondary | ICD-10-CM

## 2020-08-26 DIAGNOSIS — I081 Rheumatic disorders of both mitral and tricuspid valves: Secondary | ICD-10-CM | POA: Diagnosis not present

## 2020-08-26 DIAGNOSIS — Z9981 Dependence on supplemental oxygen: Secondary | ICD-10-CM | POA: Diagnosis not present

## 2020-08-26 DIAGNOSIS — I130 Hypertensive heart and chronic kidney disease with heart failure and stage 1 through stage 4 chronic kidney disease, or unspecified chronic kidney disease: Secondary | ICD-10-CM | POA: Diagnosis not present

## 2020-08-26 DIAGNOSIS — Z45018 Encounter for adjustment and management of other part of cardiac pacemaker: Secondary | ICD-10-CM

## 2020-08-26 DIAGNOSIS — I420 Dilated cardiomyopathy: Secondary | ICD-10-CM | POA: Diagnosis not present

## 2020-08-26 DIAGNOSIS — I5023 Acute on chronic systolic (congestive) heart failure: Secondary | ICD-10-CM | POA: Diagnosis not present

## 2020-08-26 DIAGNOSIS — F32A Depression, unspecified: Secondary | ICD-10-CM | POA: Diagnosis not present

## 2020-08-26 DIAGNOSIS — G2581 Restless legs syndrome: Secondary | ICD-10-CM | POA: Diagnosis not present

## 2020-08-26 DIAGNOSIS — J449 Chronic obstructive pulmonary disease, unspecified: Secondary | ICD-10-CM | POA: Diagnosis not present

## 2020-08-26 DIAGNOSIS — Z87891 Personal history of nicotine dependence: Secondary | ICD-10-CM | POA: Diagnosis not present

## 2020-08-26 DIAGNOSIS — I2510 Atherosclerotic heart disease of native coronary artery without angina pectoris: Secondary | ICD-10-CM | POA: Diagnosis not present

## 2020-08-26 DIAGNOSIS — I4891 Unspecified atrial fibrillation: Secondary | ICD-10-CM | POA: Diagnosis not present

## 2020-08-26 DIAGNOSIS — Z7901 Long term (current) use of anticoagulants: Secondary | ICD-10-CM | POA: Diagnosis not present

## 2020-08-26 DIAGNOSIS — N200 Calculus of kidney: Secondary | ICD-10-CM | POA: Diagnosis not present

## 2020-08-26 DIAGNOSIS — N1831 Chronic kidney disease, stage 3a: Secondary | ICD-10-CM | POA: Diagnosis not present

## 2020-08-26 DIAGNOSIS — Z8616 Personal history of COVID-19: Secondary | ICD-10-CM | POA: Diagnosis not present

## 2020-08-26 DIAGNOSIS — I447 Left bundle-branch block, unspecified: Secondary | ICD-10-CM | POA: Diagnosis not present

## 2020-08-26 DIAGNOSIS — Z7951 Long term (current) use of inhaled steroids: Secondary | ICD-10-CM | POA: Diagnosis not present

## 2020-08-26 DIAGNOSIS — J9620 Acute and chronic respiratory failure, unspecified whether with hypoxia or hypercapnia: Secondary | ICD-10-CM | POA: Diagnosis not present

## 2020-08-26 DIAGNOSIS — Z9181 History of falling: Secondary | ICD-10-CM | POA: Diagnosis not present

## 2020-08-26 DIAGNOSIS — G4700 Insomnia, unspecified: Secondary | ICD-10-CM | POA: Diagnosis not present

## 2020-08-30 ENCOUNTER — Ambulatory Visit: Payer: Self-pay | Admitting: *Deleted

## 2020-08-30 DIAGNOSIS — I5022 Chronic systolic (congestive) heart failure: Secondary | ICD-10-CM | POA: Diagnosis not present

## 2020-08-30 DIAGNOSIS — N1831 Chronic kidney disease, stage 3a: Secondary | ICD-10-CM | POA: Diagnosis not present

## 2020-08-30 DIAGNOSIS — N2581 Secondary hyperparathyroidism of renal origin: Secondary | ICD-10-CM | POA: Diagnosis not present

## 2020-08-30 DIAGNOSIS — N179 Acute kidney failure, unspecified: Secondary | ICD-10-CM | POA: Diagnosis not present

## 2020-08-30 NOTE — Telephone Encounter (Signed)
Patient's daughter calling with the patient. She has ankle and foot edema bilaterally with redness. Left foot is sore to put pressure on today. Denies CP. Uses O2@3L  all the time.She is concerned that she does not take Demadex 20 mg tab only once weekly. Voids often throughout the day. Patient will be seeing Nephrology today and has OV here on 09/06/20.     Reason for Disposition  [1] Very swollen ankle AND [2] no fever  Answer Assessment - Initial Assessment Questions 1. LOCATION: "Which ankle is swollen?" "Where is the swelling?"    Both, left more than right 2. ONSET: "When did the swelling start?"     About one week ago 3. SIZE: "How large is the swelling?"     moderate 4. PAIN: "Is there any pain?" If Yes, ask: "How bad is it?" (Scale 1-10; or mild, moderate, severe)   - NONE (0): no pain.   - MILD (1-3): doesn't interfere with normal activities.    - MODERATE (4-7): interferes with normal activities (e.g., work or school) or awakens from sleep, limping.    - SEVERE (8-10): excruciating pain, unable to do any normal activities, unable to walk.      Interferes with walking. 5. CAUSE: "What do you think caused the ankle swelling?"     Fluid-possibly. Only takes torsemide once a week.  6. OTHER SYMPTOMS: "Do you have any other symptoms?" (e.g., fever, chest pain, difficulty breathing, calf pain)     Denies fever, CP/calf pain. She is on O2 @3L  all the time. 7. PREGNANCY: "Is there any chance you are pregnant?" "When was your last menstrual period?"     na  Protocols used: Ankle Swelling-A-AH

## 2020-08-31 DIAGNOSIS — I2510 Atherosclerotic heart disease of native coronary artery without angina pectoris: Secondary | ICD-10-CM | POA: Diagnosis not present

## 2020-08-31 DIAGNOSIS — J9620 Acute and chronic respiratory failure, unspecified whether with hypoxia or hypercapnia: Secondary | ICD-10-CM | POA: Diagnosis not present

## 2020-08-31 DIAGNOSIS — G4700 Insomnia, unspecified: Secondary | ICD-10-CM | POA: Diagnosis not present

## 2020-08-31 DIAGNOSIS — Z87891 Personal history of nicotine dependence: Secondary | ICD-10-CM | POA: Diagnosis not present

## 2020-08-31 DIAGNOSIS — N1831 Chronic kidney disease, stage 3a: Secondary | ICD-10-CM | POA: Diagnosis not present

## 2020-08-31 DIAGNOSIS — I4891 Unspecified atrial fibrillation: Secondary | ICD-10-CM | POA: Diagnosis not present

## 2020-08-31 DIAGNOSIS — Z7901 Long term (current) use of anticoagulants: Secondary | ICD-10-CM | POA: Diagnosis not present

## 2020-08-31 DIAGNOSIS — Z7951 Long term (current) use of inhaled steroids: Secondary | ICD-10-CM | POA: Diagnosis not present

## 2020-08-31 DIAGNOSIS — G2581 Restless legs syndrome: Secondary | ICD-10-CM | POA: Diagnosis not present

## 2020-08-31 DIAGNOSIS — J449 Chronic obstructive pulmonary disease, unspecified: Secondary | ICD-10-CM | POA: Diagnosis not present

## 2020-08-31 DIAGNOSIS — I081 Rheumatic disorders of both mitral and tricuspid valves: Secondary | ICD-10-CM | POA: Diagnosis not present

## 2020-08-31 DIAGNOSIS — Z8616 Personal history of COVID-19: Secondary | ICD-10-CM | POA: Diagnosis not present

## 2020-08-31 DIAGNOSIS — I420 Dilated cardiomyopathy: Secondary | ICD-10-CM | POA: Diagnosis not present

## 2020-08-31 DIAGNOSIS — Z9181 History of falling: Secondary | ICD-10-CM | POA: Diagnosis not present

## 2020-08-31 DIAGNOSIS — I5023 Acute on chronic systolic (congestive) heart failure: Secondary | ICD-10-CM | POA: Diagnosis not present

## 2020-08-31 DIAGNOSIS — I130 Hypertensive heart and chronic kidney disease with heart failure and stage 1 through stage 4 chronic kidney disease, or unspecified chronic kidney disease: Secondary | ICD-10-CM | POA: Diagnosis not present

## 2020-08-31 DIAGNOSIS — Z9981 Dependence on supplemental oxygen: Secondary | ICD-10-CM | POA: Diagnosis not present

## 2020-08-31 DIAGNOSIS — N200 Calculus of kidney: Secondary | ICD-10-CM | POA: Diagnosis not present

## 2020-08-31 DIAGNOSIS — I447 Left bundle-branch block, unspecified: Secondary | ICD-10-CM | POA: Diagnosis not present

## 2020-08-31 DIAGNOSIS — F32A Depression, unspecified: Secondary | ICD-10-CM | POA: Diagnosis not present

## 2020-09-01 ENCOUNTER — Ambulatory Visit
Admission: RE | Admit: 2020-09-01 | Discharge: 2020-09-01 | Disposition: A | Discharge status: Home / Self Care | Payer: Medicare Other | Source: Ambulatory Visit | Attending: Nephrology | Admitting: Nephrology

## 2020-09-01 ENCOUNTER — Ambulatory Visit: Payer: Medicare Other | Admitting: Cardiology

## 2020-09-01 ENCOUNTER — Other Ambulatory Visit: Payer: Self-pay

## 2020-09-01 DIAGNOSIS — R609 Edema, unspecified: Secondary | ICD-10-CM | POA: Insufficient documentation

## 2020-09-01 MED ORDER — POTASSIUM CHLORIDE CRYS ER 20 MEQ PO TBCR
EXTENDED_RELEASE_TABLET | ORAL | Status: AC
  Administered 2020-09-01: 20 meq via ORAL
  Filled 2020-09-01: qty 1

## 2020-09-01 MED ORDER — FUROSEMIDE 10 MG/ML IJ SOLN
INTRAMUSCULAR | Status: AC
  Administered 2020-09-01: 80 mg via INTRAVENOUS
  Filled 2020-09-01: qty 8

## 2020-09-01 MED ORDER — FUROSEMIDE 10 MG/ML IJ SOLN: 80.0000 mg | Freq: Once | INTRAMUSCULAR | Status: AC

## 2020-09-01 MED ORDER — POTASSIUM CHLORIDE CRYS ER 20 MEQ PO TBCR: 20.0000 meq | EXTENDED_RELEASE_TABLET | Freq: Once | ORAL | Status: AC

## 2020-09-02 DIAGNOSIS — Z7951 Long term (current) use of inhaled steroids: Secondary | ICD-10-CM | POA: Diagnosis not present

## 2020-09-02 DIAGNOSIS — Z9181 History of falling: Secondary | ICD-10-CM | POA: Diagnosis not present

## 2020-09-02 DIAGNOSIS — I447 Left bundle-branch block, unspecified: Secondary | ICD-10-CM | POA: Diagnosis not present

## 2020-09-02 DIAGNOSIS — G4700 Insomnia, unspecified: Secondary | ICD-10-CM | POA: Diagnosis not present

## 2020-09-02 DIAGNOSIS — G2581 Restless legs syndrome: Secondary | ICD-10-CM | POA: Diagnosis not present

## 2020-09-02 DIAGNOSIS — Z9981 Dependence on supplemental oxygen: Secondary | ICD-10-CM | POA: Diagnosis not present

## 2020-09-02 DIAGNOSIS — Z87891 Personal history of nicotine dependence: Secondary | ICD-10-CM | POA: Diagnosis not present

## 2020-09-02 DIAGNOSIS — F32A Depression, unspecified: Secondary | ICD-10-CM | POA: Diagnosis not present

## 2020-09-02 DIAGNOSIS — J9620 Acute and chronic respiratory failure, unspecified whether with hypoxia or hypercapnia: Secondary | ICD-10-CM | POA: Diagnosis not present

## 2020-09-02 DIAGNOSIS — I081 Rheumatic disorders of both mitral and tricuspid valves: Secondary | ICD-10-CM | POA: Diagnosis not present

## 2020-09-02 DIAGNOSIS — Z8616 Personal history of COVID-19: Secondary | ICD-10-CM | POA: Diagnosis not present

## 2020-09-02 DIAGNOSIS — I5023 Acute on chronic systolic (congestive) heart failure: Secondary | ICD-10-CM | POA: Diagnosis not present

## 2020-09-02 DIAGNOSIS — Z7901 Long term (current) use of anticoagulants: Secondary | ICD-10-CM | POA: Diagnosis not present

## 2020-09-02 DIAGNOSIS — I2510 Atherosclerotic heart disease of native coronary artery without angina pectoris: Secondary | ICD-10-CM | POA: Diagnosis not present

## 2020-09-02 DIAGNOSIS — J449 Chronic obstructive pulmonary disease, unspecified: Secondary | ICD-10-CM | POA: Diagnosis not present

## 2020-09-02 DIAGNOSIS — I130 Hypertensive heart and chronic kidney disease with heart failure and stage 1 through stage 4 chronic kidney disease, or unspecified chronic kidney disease: Secondary | ICD-10-CM | POA: Diagnosis not present

## 2020-09-02 DIAGNOSIS — N200 Calculus of kidney: Secondary | ICD-10-CM | POA: Diagnosis not present

## 2020-09-02 DIAGNOSIS — I420 Dilated cardiomyopathy: Secondary | ICD-10-CM | POA: Diagnosis not present

## 2020-09-02 DIAGNOSIS — N1831 Chronic kidney disease, stage 3a: Secondary | ICD-10-CM | POA: Diagnosis not present

## 2020-09-02 DIAGNOSIS — I4891 Unspecified atrial fibrillation: Secondary | ICD-10-CM | POA: Diagnosis not present

## 2020-09-03 ENCOUNTER — Ambulatory Visit
Admission: RE | Admit: 2020-09-03 | Discharge: 2020-09-03 | Disposition: A | Discharge status: Home / Self Care | Payer: Medicare Other | Source: Ambulatory Visit | Attending: Nephrology | Admitting: Nephrology

## 2020-09-03 ENCOUNTER — Other Ambulatory Visit: Payer: Self-pay

## 2020-09-03 DIAGNOSIS — R600 Localized edema: Secondary | ICD-10-CM | POA: Insufficient documentation

## 2020-09-03 MED ORDER — POTASSIUM CHLORIDE CRYS ER 20 MEQ PO TBCR
EXTENDED_RELEASE_TABLET | ORAL | Status: AC
  Filled 2020-09-03: qty 1

## 2020-09-03 MED ORDER — POTASSIUM CHLORIDE CRYS ER 20 MEQ PO TBCR
EXTENDED_RELEASE_TABLET | ORAL | Status: AC
  Administered 2020-09-03: 20 meq via ORAL
  Filled 2020-09-03: qty 1

## 2020-09-03 MED ORDER — FUROSEMIDE 10 MG/ML IJ SOLN
INTRAMUSCULAR | Status: AC
  Administered 2020-09-03: 80 mg via INTRAVENOUS
  Filled 2020-09-03: qty 8

## 2020-09-03 MED ORDER — POTASSIUM CHLORIDE CRYS ER 20 MEQ PO TBCR: 20.0000 meq | EXTENDED_RELEASE_TABLET | Freq: Once | ORAL | Status: AC

## 2020-09-03 MED ORDER — FUROSEMIDE 10 MG/ML IJ SOLN: 80.0000 mg | Freq: Once | INTRAMUSCULAR | Status: AC

## 2020-09-06 ENCOUNTER — Encounter: Payer: Medicare Other | Admitting: Nurse Practitioner

## 2020-09-07 DIAGNOSIS — J9620 Acute and chronic respiratory failure, unspecified whether with hypoxia or hypercapnia: Secondary | ICD-10-CM | POA: Diagnosis not present

## 2020-09-07 DIAGNOSIS — F32A Depression, unspecified: Secondary | ICD-10-CM | POA: Diagnosis not present

## 2020-09-07 DIAGNOSIS — J449 Chronic obstructive pulmonary disease, unspecified: Secondary | ICD-10-CM | POA: Diagnosis not present

## 2020-09-07 DIAGNOSIS — Z9981 Dependence on supplemental oxygen: Secondary | ICD-10-CM | POA: Diagnosis not present

## 2020-09-07 DIAGNOSIS — Z7901 Long term (current) use of anticoagulants: Secondary | ICD-10-CM | POA: Diagnosis not present

## 2020-09-07 DIAGNOSIS — N200 Calculus of kidney: Secondary | ICD-10-CM | POA: Diagnosis not present

## 2020-09-07 DIAGNOSIS — Z87891 Personal history of nicotine dependence: Secondary | ICD-10-CM | POA: Diagnosis not present

## 2020-09-07 DIAGNOSIS — I420 Dilated cardiomyopathy: Secondary | ICD-10-CM | POA: Diagnosis not present

## 2020-09-07 DIAGNOSIS — I2510 Atherosclerotic heart disease of native coronary artery without angina pectoris: Secondary | ICD-10-CM | POA: Diagnosis not present

## 2020-09-07 DIAGNOSIS — Z9181 History of falling: Secondary | ICD-10-CM | POA: Diagnosis not present

## 2020-09-07 DIAGNOSIS — I081 Rheumatic disorders of both mitral and tricuspid valves: Secondary | ICD-10-CM | POA: Diagnosis not present

## 2020-09-07 DIAGNOSIS — I447 Left bundle-branch block, unspecified: Secondary | ICD-10-CM | POA: Diagnosis not present

## 2020-09-07 DIAGNOSIS — I130 Hypertensive heart and chronic kidney disease with heart failure and stage 1 through stage 4 chronic kidney disease, or unspecified chronic kidney disease: Secondary | ICD-10-CM | POA: Diagnosis not present

## 2020-09-07 DIAGNOSIS — G4700 Insomnia, unspecified: Secondary | ICD-10-CM | POA: Diagnosis not present

## 2020-09-07 DIAGNOSIS — N1831 Chronic kidney disease, stage 3a: Secondary | ICD-10-CM | POA: Diagnosis not present

## 2020-09-07 DIAGNOSIS — I4891 Unspecified atrial fibrillation: Secondary | ICD-10-CM | POA: Diagnosis not present

## 2020-09-07 DIAGNOSIS — G2581 Restless legs syndrome: Secondary | ICD-10-CM | POA: Diagnosis not present

## 2020-09-07 DIAGNOSIS — Z8616 Personal history of COVID-19: Secondary | ICD-10-CM | POA: Diagnosis not present

## 2020-09-07 DIAGNOSIS — I5023 Acute on chronic systolic (congestive) heart failure: Secondary | ICD-10-CM | POA: Diagnosis not present

## 2020-09-07 DIAGNOSIS — Z7951 Long term (current) use of inhaled steroids: Secondary | ICD-10-CM | POA: Diagnosis not present

## 2020-09-08 ENCOUNTER — Ambulatory Visit
Admission: RE | Admit: 2020-09-08 | Discharge: 2020-09-08 | Disposition: A | Discharge status: Home / Self Care | Payer: Medicare Other | Source: Ambulatory Visit | Attending: Nephrology | Admitting: Nephrology

## 2020-09-08 ENCOUNTER — Other Ambulatory Visit: Payer: Self-pay

## 2020-09-08 DIAGNOSIS — Z01818 Encounter for other preprocedural examination: Secondary | ICD-10-CM | POA: Diagnosis not present

## 2020-09-08 DIAGNOSIS — I480 Paroxysmal atrial fibrillation: Secondary | ICD-10-CM | POA: Diagnosis not present

## 2020-09-08 DIAGNOSIS — R609 Edema, unspecified: Secondary | ICD-10-CM | POA: Insufficient documentation

## 2020-09-08 LAB — RENAL FUNCTION PANEL
Albumin: 3.7 g/dL (ref 3.5–5.0)
Anion gap: 11 (ref 5–15)
BUN: 34 mg/dL — ABNORMAL HIGH (ref 8–23)
CO2: 36 mmol/L — ABNORMAL HIGH (ref 22–32)
Calcium: 9.2 mg/dL (ref 8.9–10.3)
Chloride: 91 mmol/L — ABNORMAL LOW (ref 98–111)
Creatinine, Ser: 2.19 mg/dL — ABNORMAL HIGH (ref 0.44–1.00)
GFR, Estimated: 24 mL/min — ABNORMAL LOW (ref >60)
Glucose, Bld: 113 mg/dL — ABNORMAL HIGH (ref 70–99)
Phosphorus: 3.6 mg/dL (ref 2.5–4.6)
Potassium: 4.2 mmol/L (ref 3.5–5.1)
Sodium: 138 mmol/L (ref 135–145)

## 2020-09-08 MED ORDER — FUROSEMIDE 10 MG/ML IJ SOLN: 80.0000 mg | Freq: Once | INTRAMUSCULAR | Status: AC

## 2020-09-08 MED ORDER — POTASSIUM CHLORIDE CRYS ER 20 MEQ PO TBCR: 20.0000 meq | EXTENDED_RELEASE_TABLET | Freq: Once | ORAL | Status: AC

## 2020-09-08 MED ORDER — FUROSEMIDE 10 MG/ML IJ SOLN
INTRAMUSCULAR | Status: AC
  Administered 2020-09-08: 80 mg via INTRAVENOUS
  Filled 2020-09-08: qty 8

## 2020-09-08 MED ORDER — POTASSIUM CHLORIDE CRYS ER 20 MEQ PO TBCR
EXTENDED_RELEASE_TABLET | ORAL | Status: AC
  Administered 2020-09-08: 20 meq via ORAL
  Filled 2020-09-08: qty 1

## 2020-09-08 MED ORDER — SODIUM CHLORIDE FLUSH 0.9 % IV SOLN
INTRAVENOUS | Status: AC
  Filled 2020-09-08: qty 10

## 2020-09-09 DIAGNOSIS — G2581 Restless legs syndrome: Secondary | ICD-10-CM | POA: Diagnosis not present

## 2020-09-09 DIAGNOSIS — I4891 Unspecified atrial fibrillation: Secondary | ICD-10-CM | POA: Diagnosis not present

## 2020-09-09 DIAGNOSIS — Z9981 Dependence on supplemental oxygen: Secondary | ICD-10-CM | POA: Diagnosis not present

## 2020-09-09 DIAGNOSIS — I5023 Acute on chronic systolic (congestive) heart failure: Secondary | ICD-10-CM | POA: Diagnosis not present

## 2020-09-09 DIAGNOSIS — J9620 Acute and chronic respiratory failure, unspecified whether with hypoxia or hypercapnia: Secondary | ICD-10-CM | POA: Diagnosis not present

## 2020-09-09 DIAGNOSIS — I081 Rheumatic disorders of both mitral and tricuspid valves: Secondary | ICD-10-CM | POA: Diagnosis not present

## 2020-09-09 DIAGNOSIS — Z87891 Personal history of nicotine dependence: Secondary | ICD-10-CM | POA: Diagnosis not present

## 2020-09-09 DIAGNOSIS — Z9181 History of falling: Secondary | ICD-10-CM | POA: Diagnosis not present

## 2020-09-09 DIAGNOSIS — I447 Left bundle-branch block, unspecified: Secondary | ICD-10-CM | POA: Diagnosis not present

## 2020-09-09 DIAGNOSIS — I130 Hypertensive heart and chronic kidney disease with heart failure and stage 1 through stage 4 chronic kidney disease, or unspecified chronic kidney disease: Secondary | ICD-10-CM | POA: Diagnosis not present

## 2020-09-09 DIAGNOSIS — I2510 Atherosclerotic heart disease of native coronary artery without angina pectoris: Secondary | ICD-10-CM | POA: Diagnosis not present

## 2020-09-09 DIAGNOSIS — G4700 Insomnia, unspecified: Secondary | ICD-10-CM | POA: Diagnosis not present

## 2020-09-09 DIAGNOSIS — I420 Dilated cardiomyopathy: Secondary | ICD-10-CM | POA: Diagnosis not present

## 2020-09-09 DIAGNOSIS — N200 Calculus of kidney: Secondary | ICD-10-CM | POA: Diagnosis not present

## 2020-09-09 DIAGNOSIS — F32A Depression, unspecified: Secondary | ICD-10-CM | POA: Diagnosis not present

## 2020-09-09 DIAGNOSIS — Z8616 Personal history of COVID-19: Secondary | ICD-10-CM | POA: Diagnosis not present

## 2020-09-09 DIAGNOSIS — Z7901 Long term (current) use of anticoagulants: Secondary | ICD-10-CM | POA: Diagnosis not present

## 2020-09-09 DIAGNOSIS — N1831 Chronic kidney disease, stage 3a: Secondary | ICD-10-CM | POA: Diagnosis not present

## 2020-09-09 DIAGNOSIS — Z7951 Long term (current) use of inhaled steroids: Secondary | ICD-10-CM | POA: Diagnosis not present

## 2020-09-09 DIAGNOSIS — J449 Chronic obstructive pulmonary disease, unspecified: Secondary | ICD-10-CM | POA: Diagnosis not present

## 2020-09-10 ENCOUNTER — Other Ambulatory Visit: Payer: Self-pay

## 2020-09-10 ENCOUNTER — Ambulatory Visit
Admission: RE | Admit: 2020-09-10 | Discharge: 2020-09-10 | Disposition: A | Discharge status: Home / Self Care | Payer: Medicare Other | Source: Ambulatory Visit | Attending: Nephrology | Admitting: Nephrology

## 2020-09-10 DIAGNOSIS — Z9981 Dependence on supplemental oxygen: Secondary | ICD-10-CM | POA: Diagnosis not present

## 2020-09-10 DIAGNOSIS — N200 Calculus of kidney: Secondary | ICD-10-CM | POA: Diagnosis not present

## 2020-09-10 DIAGNOSIS — Z87891 Personal history of nicotine dependence: Secondary | ICD-10-CM | POA: Diagnosis not present

## 2020-09-10 DIAGNOSIS — I081 Rheumatic disorders of both mitral and tricuspid valves: Secondary | ICD-10-CM | POA: Diagnosis not present

## 2020-09-10 DIAGNOSIS — R609 Edema, unspecified: Secondary | ICD-10-CM | POA: Insufficient documentation

## 2020-09-10 DIAGNOSIS — Z9181 History of falling: Secondary | ICD-10-CM | POA: Diagnosis not present

## 2020-09-10 DIAGNOSIS — G2581 Restless legs syndrome: Secondary | ICD-10-CM | POA: Diagnosis not present

## 2020-09-10 DIAGNOSIS — N1831 Chronic kidney disease, stage 3a: Secondary | ICD-10-CM | POA: Diagnosis not present

## 2020-09-10 DIAGNOSIS — I420 Dilated cardiomyopathy: Secondary | ICD-10-CM | POA: Diagnosis not present

## 2020-09-10 DIAGNOSIS — I130 Hypertensive heart and chronic kidney disease with heart failure and stage 1 through stage 4 chronic kidney disease, or unspecified chronic kidney disease: Secondary | ICD-10-CM | POA: Diagnosis not present

## 2020-09-10 DIAGNOSIS — I447 Left bundle-branch block, unspecified: Secondary | ICD-10-CM | POA: Diagnosis not present

## 2020-09-10 DIAGNOSIS — Z7901 Long term (current) use of anticoagulants: Secondary | ICD-10-CM | POA: Diagnosis not present

## 2020-09-10 DIAGNOSIS — J9620 Acute and chronic respiratory failure, unspecified whether with hypoxia or hypercapnia: Secondary | ICD-10-CM | POA: Diagnosis not present

## 2020-09-10 DIAGNOSIS — G4700 Insomnia, unspecified: Secondary | ICD-10-CM | POA: Diagnosis not present

## 2020-09-10 DIAGNOSIS — J449 Chronic obstructive pulmonary disease, unspecified: Secondary | ICD-10-CM | POA: Diagnosis not present

## 2020-09-10 DIAGNOSIS — F32A Depression, unspecified: Secondary | ICD-10-CM | POA: Diagnosis not present

## 2020-09-10 DIAGNOSIS — Z8616 Personal history of COVID-19: Secondary | ICD-10-CM | POA: Diagnosis not present

## 2020-09-10 DIAGNOSIS — Z7951 Long term (current) use of inhaled steroids: Secondary | ICD-10-CM | POA: Diagnosis not present

## 2020-09-10 DIAGNOSIS — I2510 Atherosclerotic heart disease of native coronary artery without angina pectoris: Secondary | ICD-10-CM | POA: Diagnosis not present

## 2020-09-10 DIAGNOSIS — I5023 Acute on chronic systolic (congestive) heart failure: Secondary | ICD-10-CM | POA: Diagnosis not present

## 2020-09-10 DIAGNOSIS — I4891 Unspecified atrial fibrillation: Secondary | ICD-10-CM | POA: Diagnosis not present

## 2020-09-10 MED ORDER — POTASSIUM CHLORIDE CRYS ER 20 MEQ PO TBCR
EXTENDED_RELEASE_TABLET | ORAL | Status: AC
  Administered 2020-09-10: 20 meq via ORAL
  Filled 2020-09-10: qty 1

## 2020-09-10 MED ORDER — FUROSEMIDE 10 MG/ML IJ SOLN
INTRAMUSCULAR | Status: AC
  Administered 2020-09-10: 80 mg via INTRAVENOUS
  Filled 2020-09-10: qty 8

## 2020-09-10 MED ORDER — FUROSEMIDE 10 MG/ML IJ SOLN: 80.0000 mg | Freq: Once | INTRAMUSCULAR | Status: AC

## 2020-09-10 MED ORDER — POTASSIUM CHLORIDE CRYS ER 20 MEQ PO TBCR: 20.0000 meq | EXTENDED_RELEASE_TABLET | Freq: Once | ORAL | Status: AC

## 2020-09-13 DIAGNOSIS — I130 Hypertensive heart and chronic kidney disease with heart failure and stage 1 through stage 4 chronic kidney disease, or unspecified chronic kidney disease: Secondary | ICD-10-CM | POA: Diagnosis not present

## 2020-09-13 DIAGNOSIS — Z7901 Long term (current) use of anticoagulants: Secondary | ICD-10-CM | POA: Diagnosis not present

## 2020-09-13 DIAGNOSIS — I5023 Acute on chronic systolic (congestive) heart failure: Secondary | ICD-10-CM | POA: Diagnosis not present

## 2020-09-13 DIAGNOSIS — Z87891 Personal history of nicotine dependence: Secondary | ICD-10-CM | POA: Diagnosis not present

## 2020-09-13 DIAGNOSIS — J449 Chronic obstructive pulmonary disease, unspecified: Secondary | ICD-10-CM | POA: Diagnosis not present

## 2020-09-13 DIAGNOSIS — Z8616 Personal history of COVID-19: Secondary | ICD-10-CM | POA: Diagnosis not present

## 2020-09-13 DIAGNOSIS — I2510 Atherosclerotic heart disease of native coronary artery without angina pectoris: Secondary | ICD-10-CM | POA: Diagnosis not present

## 2020-09-13 DIAGNOSIS — I081 Rheumatic disorders of both mitral and tricuspid valves: Secondary | ICD-10-CM | POA: Diagnosis not present

## 2020-09-13 DIAGNOSIS — F32A Depression, unspecified: Secondary | ICD-10-CM | POA: Diagnosis not present

## 2020-09-13 DIAGNOSIS — I4891 Unspecified atrial fibrillation: Secondary | ICD-10-CM | POA: Diagnosis not present

## 2020-09-13 DIAGNOSIS — I447 Left bundle-branch block, unspecified: Secondary | ICD-10-CM | POA: Diagnosis not present

## 2020-09-13 DIAGNOSIS — G4700 Insomnia, unspecified: Secondary | ICD-10-CM | POA: Diagnosis not present

## 2020-09-13 DIAGNOSIS — N200 Calculus of kidney: Secondary | ICD-10-CM | POA: Diagnosis not present

## 2020-09-13 DIAGNOSIS — J9620 Acute and chronic respiratory failure, unspecified whether with hypoxia or hypercapnia: Secondary | ICD-10-CM | POA: Diagnosis not present

## 2020-09-13 DIAGNOSIS — I420 Dilated cardiomyopathy: Secondary | ICD-10-CM | POA: Diagnosis not present

## 2020-09-13 DIAGNOSIS — G2581 Restless legs syndrome: Secondary | ICD-10-CM | POA: Diagnosis not present

## 2020-09-13 DIAGNOSIS — Z7951 Long term (current) use of inhaled steroids: Secondary | ICD-10-CM | POA: Diagnosis not present

## 2020-09-13 DIAGNOSIS — Z9181 History of falling: Secondary | ICD-10-CM | POA: Diagnosis not present

## 2020-09-13 DIAGNOSIS — Z9981 Dependence on supplemental oxygen: Secondary | ICD-10-CM | POA: Diagnosis not present

## 2020-09-13 DIAGNOSIS — N1831 Chronic kidney disease, stage 3a: Secondary | ICD-10-CM | POA: Diagnosis not present

## 2020-09-14 ENCOUNTER — Ambulatory Visit
Admission: RE | Admit: 2020-09-14 | Discharge: 2020-09-14 | Disposition: A | Discharge status: Home / Self Care | Payer: Medicare Other | Attending: Cardiology | Admitting: Cardiology

## 2020-09-14 ENCOUNTER — Other Ambulatory Visit: Payer: Self-pay

## 2020-09-14 ENCOUNTER — Encounter
Admission: RE | Disposition: A | Discharge status: Home / Self Care | Payer: Self-pay | Source: Home / Self Care | Attending: Cardiology

## 2020-09-14 ENCOUNTER — Encounter: Payer: Self-pay | Admitting: Cardiology

## 2020-09-14 DIAGNOSIS — I5022 Chronic systolic (congestive) heart failure: Secondary | ICD-10-CM | POA: Diagnosis not present

## 2020-09-14 DIAGNOSIS — Z4502 Encounter for adjustment and management of automatic implantable cardiac defibrillator: Secondary | ICD-10-CM | POA: Diagnosis not present

## 2020-09-14 DIAGNOSIS — T82191A Other mechanical complication of cardiac pulse generator (battery), initial encounter: Secondary | ICD-10-CM | POA: Diagnosis not present

## 2020-09-14 DIAGNOSIS — Z45018 Encounter for adjustment and management of other part of cardiac pacemaker: Secondary | ICD-10-CM

## 2020-09-14 HISTORY — PX: PPM GENERATOR CHANGEOUT: EP1233

## 2020-09-14 SURGERY — PPM GENERATOR CHANGEOUT
Anesthesia: Moderate Sedation

## 2020-09-14 MED ORDER — FENTANYL CITRATE (PF) 100 MCG/2ML IJ SOLN
INTRAMUSCULAR | Status: DC | PRN
  Administered 2020-09-14: 12.5 ug via INTRAVENOUS
  Administered 2020-09-14: 25 ug via INTRAVENOUS

## 2020-09-14 MED ORDER — FENTANYL CITRATE PF 50 MCG/ML IJ SOSY
PREFILLED_SYRINGE | INTRAMUSCULAR | Status: AC
  Filled 2020-09-14: qty 1

## 2020-09-14 MED ORDER — CHLORHEXIDINE GLUCONATE CLOTH 2 % EX PADS: 6.0000 | MEDICATED_PAD | Freq: Every day | CUTANEOUS | Status: DC

## 2020-09-14 MED ORDER — LIDOCAINE HCL 1 % IJ SOLN
INTRAMUSCULAR | Status: AC
  Filled 2020-09-14: qty 20

## 2020-09-14 MED ORDER — MIDAZOLAM HCL 2 MG/2ML IJ SOLN
INTRAMUSCULAR | Status: AC
  Filled 2020-09-14: qty 2

## 2020-09-14 MED ORDER — SODIUM CHLORIDE 0.45 % IV SOLN: INTRAVENOUS | Status: DC

## 2020-09-14 MED ORDER — GENTAMICIN SULFATE 40 MG/ML IJ SOLN
80.0000 mg | INTRAMUSCULAR | Status: AC
  Administered 2020-09-14: 80 mg
  Filled 2020-09-14: qty 80

## 2020-09-14 MED ORDER — ONDANSETRON HCL 4 MG/2ML IJ SOLN: 4.0000 mg | Freq: Four times a day (QID) | INTRAMUSCULAR | Status: DC | PRN

## 2020-09-14 MED ORDER — VANCOMYCIN HCL IN DEXTROSE 1-5 GM/200ML-% IV SOLN
1000.0000 mg | INTRAVENOUS | Status: AC
  Administered 2020-09-14: 1000 mg via INTRAVENOUS
  Filled 2020-09-14: qty 200

## 2020-09-14 MED ORDER — LIDOCAINE HCL (PF) 1 % IJ SOLN
INTRAMUSCULAR | Status: DC | PRN
  Administered 2020-09-14: 30 mL

## 2020-09-14 MED ORDER — MIDAZOLAM HCL 2 MG/2ML IJ SOLN
INTRAMUSCULAR | Status: DC | PRN
  Administered 2020-09-14: .5 mg via INTRAVENOUS
  Administered 2020-09-14: 1 mg via INTRAVENOUS

## 2020-09-14 MED ORDER — SODIUM CHLORIDE 0.9 % IV SOLN: INTRAVENOUS | Status: DC

## 2020-09-14 MED ORDER — ACETAMINOPHEN 325 MG PO TABS: 325.0000 mg | ORAL_TABLET | ORAL | Status: DC | PRN

## 2020-09-14 SURGICAL SUPPLY — 18 items
CABLE SURG 12 DISP A/V CHANNEL (MISCELLANEOUS) ×1 IMPLANT
COVER SURGICAL LIGHT HANDLE (MISCELLANEOUS) ×1 IMPLANT
DEVICE DSSCT PLSMBLD 3.0S LGHT (MISCELLANEOUS) IMPLANT
DRAPE INCISE 23X17 IOBAN STRL (DRAPES) ×1
DRAPE INCISE 23X17 STRL (DRAPES) IMPLANT
DRAPE INCISE IOBAN 23X17 STRL (DRAPES) ×1 IMPLANT
ELECT REM PT RETURN 9FT ADLT (ELECTROSURGICAL) ×2
ELECTRODE REM PT RTRN 9FT ADLT (ELECTROSURGICAL) IMPLANT
ICD CLARIA MRI DTMA1QQ (ICD Generator) ×1 IMPLANT
PAD ELECT DEFIB RADIOL ZOLL (MISCELLANEOUS) ×2 IMPLANT
PLASMABLADE 3.0S W/LIGHT (MISCELLANEOUS) ×2
SUT SILK 0 FSL (SUTURE) ×1 IMPLANT
SUT VIC AB 2-0 CT1 27 (SUTURE) ×2
SUT VIC AB 2-0 CT1 TAPERPNT 27 (SUTURE) IMPLANT
SUT VICRYL 4-0  27 PS-2 BARIAT (SUTURE) ×2
SUT VICRYL 4-0 27 PS-2 BARIAT (SUTURE) ×1
SUTURE VICRYL 4-0 27 PS-2 BART (SUTURE) IMPLANT
TRAY PACEMAKER INSERTION (PACKS) ×2 IMPLANT

## 2020-09-14 NOTE — Discharge Instructions (Signed)
Patient may remove outer bandage and shower on 09/16/2020.  Leave Steri-Strips on.

## 2020-09-14 NOTE — Discharge Instr - Supplementary Instructions (Signed)
May shower on Thursday, do not let water directly hit wound area. Do not scrub. Pat dry.

## 2020-09-17 DIAGNOSIS — N1831 Chronic kidney disease, stage 3a: Secondary | ICD-10-CM | POA: Diagnosis not present

## 2020-09-17 DIAGNOSIS — I130 Hypertensive heart and chronic kidney disease with heart failure and stage 1 through stage 4 chronic kidney disease, or unspecified chronic kidney disease: Secondary | ICD-10-CM | POA: Diagnosis not present

## 2020-09-17 DIAGNOSIS — I420 Dilated cardiomyopathy: Secondary | ICD-10-CM | POA: Diagnosis not present

## 2020-09-17 DIAGNOSIS — F32A Depression, unspecified: Secondary | ICD-10-CM | POA: Diagnosis not present

## 2020-09-17 DIAGNOSIS — I4891 Unspecified atrial fibrillation: Secondary | ICD-10-CM | POA: Diagnosis not present

## 2020-09-17 DIAGNOSIS — I2510 Atherosclerotic heart disease of native coronary artery without angina pectoris: Secondary | ICD-10-CM | POA: Diagnosis not present

## 2020-09-17 DIAGNOSIS — Z87891 Personal history of nicotine dependence: Secondary | ICD-10-CM | POA: Diagnosis not present

## 2020-09-17 DIAGNOSIS — Z7901 Long term (current) use of anticoagulants: Secondary | ICD-10-CM | POA: Diagnosis not present

## 2020-09-17 DIAGNOSIS — Z8616 Personal history of COVID-19: Secondary | ICD-10-CM | POA: Diagnosis not present

## 2020-09-17 DIAGNOSIS — I081 Rheumatic disorders of both mitral and tricuspid valves: Secondary | ICD-10-CM | POA: Diagnosis not present

## 2020-09-17 DIAGNOSIS — Z9981 Dependence on supplemental oxygen: Secondary | ICD-10-CM | POA: Diagnosis not present

## 2020-09-17 DIAGNOSIS — Z7951 Long term (current) use of inhaled steroids: Secondary | ICD-10-CM | POA: Diagnosis not present

## 2020-09-17 DIAGNOSIS — J449 Chronic obstructive pulmonary disease, unspecified: Secondary | ICD-10-CM | POA: Diagnosis not present

## 2020-09-17 DIAGNOSIS — G4700 Insomnia, unspecified: Secondary | ICD-10-CM | POA: Diagnosis not present

## 2020-09-17 DIAGNOSIS — G2581 Restless legs syndrome: Secondary | ICD-10-CM | POA: Diagnosis not present

## 2020-09-17 DIAGNOSIS — I5023 Acute on chronic systolic (congestive) heart failure: Secondary | ICD-10-CM | POA: Diagnosis not present

## 2020-09-17 DIAGNOSIS — Z9181 History of falling: Secondary | ICD-10-CM | POA: Diagnosis not present

## 2020-09-17 DIAGNOSIS — J9620 Acute and chronic respiratory failure, unspecified whether with hypoxia or hypercapnia: Secondary | ICD-10-CM | POA: Diagnosis not present

## 2020-09-17 DIAGNOSIS — I447 Left bundle-branch block, unspecified: Secondary | ICD-10-CM | POA: Diagnosis not present

## 2020-09-17 DIAGNOSIS — N200 Calculus of kidney: Secondary | ICD-10-CM | POA: Diagnosis not present

## 2020-09-21 ENCOUNTER — Encounter: Payer: Medicare Other | Admitting: Nurse Practitioner

## 2020-09-21 DIAGNOSIS — F32A Depression, unspecified: Secondary | ICD-10-CM | POA: Diagnosis not present

## 2020-09-21 DIAGNOSIS — G4700 Insomnia, unspecified: Secondary | ICD-10-CM | POA: Diagnosis not present

## 2020-09-21 DIAGNOSIS — Z9181 History of falling: Secondary | ICD-10-CM | POA: Diagnosis not present

## 2020-09-21 DIAGNOSIS — I5023 Acute on chronic systolic (congestive) heart failure: Secondary | ICD-10-CM | POA: Diagnosis not present

## 2020-09-21 DIAGNOSIS — I081 Rheumatic disorders of both mitral and tricuspid valves: Secondary | ICD-10-CM | POA: Diagnosis not present

## 2020-09-21 DIAGNOSIS — I130 Hypertensive heart and chronic kidney disease with heart failure and stage 1 through stage 4 chronic kidney disease, or unspecified chronic kidney disease: Secondary | ICD-10-CM | POA: Diagnosis not present

## 2020-09-21 DIAGNOSIS — Z9981 Dependence on supplemental oxygen: Secondary | ICD-10-CM | POA: Diagnosis not present

## 2020-09-21 DIAGNOSIS — G2581 Restless legs syndrome: Secondary | ICD-10-CM | POA: Diagnosis not present

## 2020-09-21 DIAGNOSIS — J449 Chronic obstructive pulmonary disease, unspecified: Secondary | ICD-10-CM | POA: Diagnosis not present

## 2020-09-21 DIAGNOSIS — N200 Calculus of kidney: Secondary | ICD-10-CM | POA: Diagnosis not present

## 2020-09-21 DIAGNOSIS — I4891 Unspecified atrial fibrillation: Secondary | ICD-10-CM | POA: Diagnosis not present

## 2020-09-21 DIAGNOSIS — Z87891 Personal history of nicotine dependence: Secondary | ICD-10-CM | POA: Diagnosis not present

## 2020-09-21 DIAGNOSIS — Z7951 Long term (current) use of inhaled steroids: Secondary | ICD-10-CM | POA: Diagnosis not present

## 2020-09-21 DIAGNOSIS — N1831 Chronic kidney disease, stage 3a: Secondary | ICD-10-CM | POA: Diagnosis not present

## 2020-09-21 DIAGNOSIS — Z8616 Personal history of COVID-19: Secondary | ICD-10-CM | POA: Diagnosis not present

## 2020-09-21 DIAGNOSIS — I2510 Atherosclerotic heart disease of native coronary artery without angina pectoris: Secondary | ICD-10-CM | POA: Diagnosis not present

## 2020-09-21 DIAGNOSIS — J9620 Acute and chronic respiratory failure, unspecified whether with hypoxia or hypercapnia: Secondary | ICD-10-CM | POA: Diagnosis not present

## 2020-09-21 DIAGNOSIS — I447 Left bundle-branch block, unspecified: Secondary | ICD-10-CM | POA: Diagnosis not present

## 2020-09-21 DIAGNOSIS — I420 Dilated cardiomyopathy: Secondary | ICD-10-CM | POA: Diagnosis not present

## 2020-09-21 DIAGNOSIS — Z7901 Long term (current) use of anticoagulants: Secondary | ICD-10-CM | POA: Diagnosis not present

## 2020-09-22 NOTE — Progress Notes (Signed)
BP 101/62   Pulse 72   Temp 97.9 F (36.6 C) (Oral)   Ht 5' 2.5" (1.588 m)   Wt 170 lb 4 oz (77.2 kg)   SpO2 93%   BMI 30.64 kg/m    Subjective:    Patient ID: Maria Neal, female    DOB: 1950-01-30, 70 y.o.   MRN: [id number]  HPI: Maria Neal is a 70 y.o. female presenting on 09/23/2020 for comprehensive medical examination. Current medical complaints include:none  She currently lives with: Menopausal Symptoms: no  HEART FAILURE Patient recently had her pacemaker battery placed. Continues to follow up with cardiologist. Denies CP. Does have SOB.  COPD COPD status: controlled Satisfied with current treatment?: yes Oxygen use: yes - 3L when she is out and about. Not always on it at home Dyspnea frequency: "not very often" Cough frequency: none Rescue inhaler frequency:   Limitation of activity: yes Productive cough:  Last Spirometry:  Pneumovax: Up to Date Influenza: Up to Date  CHRONIC KIDNEY DISEASE CKD status: controlled Medications renally dose: yes Previous renal evaluation: yes Pneumovax:  Up to Date Influenza Vaccine:   Up to Date  Depression Screen done today and results listed below:  Depression screen [facility] 2/9 08/20/2020 07/07/2020 05/03/2020 04/30/2019 04/29/2018  Decreased Interest 0 0 0 0 0  Down, Depressed, Hopeless 0 0 0 0 0  PHQ - 2 Score 0 0 0 0 0  Altered sleeping 0 - - - -  Tired, decreased energy 0 - - - -  Change in appetite 0 - - - -  Feeling bad or failure about yourself  0 - - - -  Trouble concentrating 0 - - - -  Moving slowly or fidgety/restless 0 - - - -  Suicidal thoughts 0 - - - -  PHQ-9 Score 0 - - - -    The patient does not have a history of falls. I did complete a risk assessment for falls. A plan of care for falls was documented.   Past Medical History:  Past Medical History:  Diagnosis Date   AICD (automatic cardioverter/defibrillator) present    CAD (coronary artery disease)    Cardiomyopathy ([place])    CHF (congestive  heart failure) (HCC)    COPD (chronic obstructive pulmonary disease) (HCC)    Dyspnea    Headache    History of kidney stones    History of shingles    Hypertension    On home oxygen therapy    bedtime and prn   Restless leg syndrome     Surgical History:  Past Surgical History:  Procedure Laterality Date   CARDIAC CATHETERIZATION  04/2014   Dr. Idelle Leech   CARDIAC CATHETERIZATION     CARDIAC DEFIBRILLATOR PLACEMENT     CARDIOVERSION N/A 08/11/2020   Procedure: CARDIOVERSION;  Surgeon: Dixie Dials, MD;  Location: [facility] ENDOSCOPY;  Service: Cardiovascular;  Laterality: N/A;   CHOLECYSTECTOMY N/A 08/23/2016   Procedure: LAPAROSCOPIC CHOLECYSTECTOMY;  Surgeon: Clayburn Pert, MD;  Location: [facility] ORS;  Service: General;  Laterality: N/A;   [place] N/A 06/27/2018   Procedure: DILATATION & CURETTAGE/HYSTEROSCOPY WITH MYOSURE;  Surgeon: Malachy Mood, MD;  Location: [facility] ORS;  Service: Gynecology;  Laterality: N/A;   HERNIA REPAIR     LAPAROSCOPIC APPENDECTOMY N/A 02/17/2018   Procedure: APPENDECTOMY LAPAROSCOPIC;  Surgeon: Benjamine Sprague, DO;  Location: [facility] ORS;  Service: General;  Laterality: N/A;   PPM GENERATOR CHANGEOUT N/A 09/14/2020   Procedure: PPM GENERATOR CHANGEOUT;  Surgeon: Isaias Cowman, MD;  Location: [facility];  Service: Cardiovascular;  Laterality: N/A;   TEE WITHOUT CARDIOVERSION N/A 08/10/2020   Procedure: TRANSESOPHAGEAL ECHOCARDIOGRAM (TEE);  Surgeon: Dixie Dials, MD;  Location: [facility] ENDOSCOPY;  Service: Cardiovascular;  Laterality: N/A;    Medications:  Current Outpatient Medications on File Prior to Visit  Medication Sig   albuterol (PROVENTIL HFA;VENTOLIN HFA) 108 (90 Base) MCG/ACT inhaler Inhale 2 puffs into the lungs every 6 (six) hours as needed for wheezing or shortness of breath.   alum & mag hydroxide-simeth (MAALOX/MYLANTA) 200-200-20 MG/5ML suspension Take 30 mLs by mouth every 4 (four) hours  as needed for indigestion or heartburn.   amiodarone (PACERONE) 200 MG tablet Take 1 tablet (200 mg total) by mouth daily.   apixaban (ELIQUIS) 5 MG TABS tablet Take 1 tablet (5 mg total) by mouth 2 (two) times daily.   carvedilol (COREG) 12.5 MG tablet Take 12.5 mg by mouth 2 (two) times daily.   cyclobenzaprine (FLEXERIL) 5 MG tablet TAKE 1 TABLET BY MOUTH 3 TIMES DAILY AS NEEDED (Patient taking differently: Take 5 mg by mouth 3 (three) times daily as needed for muscle spasms.)   digoxin 62.5 MCG TABS Take 0.0625 mg by mouth daily.   fluticasone (FLONASE) 50 MCG/ACT nasal spray Place 2 sprays into both nostrils daily.   Fluticasone-Umeclidin-Vilant (TRELEGY ELLIPTA) 100-62.5-25 MCG/INH AEPB Inhale 1 puff into the lungs daily.   gabapentin (NEURONTIN) 100 MG capsule Take 1 capsule (100 mg total) by mouth at bedtime.   omeprazole (PRILOSEC) 20 MG capsule Take 1 capsule (20 mg total) by mouth daily.   OXYGEN Inhale 3 L into the lungs 3 (three) times daily as needed (shortness of breath or coughing).   spironolactone (ALDACTONE) 25 MG tablet Take 12.5 mg by mouth daily.   torsemide (DEMADEX) 20 MG tablet Take 1 tablet (20 mg total) by mouth once a week. (Patient taking differently: Take 20 mg by mouth daily.)   No current facility-administered medications on file prior to visit.    Allergies:  Allergies  Allergen Reactions   Levaquin [Levofloxacin] Anaphylaxis   Amoxicillin Hives    Did it involve swelling of the face/tongue/throat, SOB, or low BP? No Did it involve sudden or severe rash/hives, skin peeling, or any reaction on the inside of your mouth or nose? No Did you need to seek medical attention at a hospital or doctor's office? No When did it last happen?      10+ years If all above answers are "NO", may proceed with cephalosporin use.   Nitrofuran Derivatives Other (See Comments)    Unknown   Penicillins Other (See Comments)    Thinks it made her itch a lot, but isn't sure. Did  it involve swelling of the face/tongue/throat, SOB, or low BP? No Did it involve sudden or severe rash/hives, skin peeling, or any reaction on the inside of your mouth or nose? No Did you need to seek medical attention at a hospital or doctor's office? No When did it last happen?      10+ years If all above answers are "NO", may proceed with cephalosporin use.    Zithromax [Azithromycin] Other (See Comments)   Antihistamines, Chlorpheniramine-Type Rash    Social History:  Social History   Socioeconomic History   Marital status: Divorced    Spouse name: Not on file   Number of children: Not on file   Years of education: Not on file   Highest education level: High school  graduate  Occupational History   Occupation: retired  Tobacco Use   Smoking status: Former    Packs/day: 0.25    Types: Cigarettes   Smokeless tobacco: Never   Tobacco comments:    quit at age 30, whole pack lasted 3 weeks   Vaping Use   Vaping Use: Never used  Substance and Sexual Activity   Alcohol use: No    Alcohol/week: 0.0 standard drinks   Drug use: No   Sexual activity: Not Currently    Birth control/protection: None  Other Topics Concern   Not on file  Social History Narrative   Not on file   Social Determinants of Health   Financial Resource Strain: Not on file  Food Insecurity: Not on file  Transportation Needs: Not on file  Physical Activity: Not on file  Stress: Not on file  Social Connections: Not on file  Intimate Partner Violence: Not on file   Social History   Tobacco Use  Smoking Status Former   Packs/day: 0.25   Types: Cigarettes  Smokeless Tobacco Never  Tobacco Comments   quit at age 29, whole pack lasted 3 weeks    Social History   Substance and Sexual Activity  Alcohol Use No   Alcohol/week: 0.0 standard drinks    Family History:  Family History  Problem Relation Age of Onset   Diabetes Mellitus II Mother    Hypertension Mother    Heart attack Father     Heart disease Brother    Arthritis Sister    Depression Daughter    Depression Son    Schizophrenia Son    Arthritis Sister    Diabetes Brother     Past medical history, surgical history, medications, allergies, family history and social history reviewed with patient today and changes made to appropriate areas of the chart.   Review of Systems  Respiratory:  Positive for shortness of breath. Negative for cough.   Cardiovascular:  Negative for chest pain.  All other ROS negative except what is listed above and in the HPI.      Objective:    BP 101/62   Pulse 72   Temp 97.9 F (36.6 C) (Oral)   Ht 5' 2.5" (1.588 m)   Wt 170 lb 4 oz (77.2 kg)   SpO2 93%   BMI 30.64 kg/m   Wt Readings from Last 3 Encounters:  09/23/20 170 lb 4 oz (77.2 kg)  09/14/20 171 lb (77.6 kg)  08/20/20 193 lb (87.5 kg)    Physical Exam Vitals and nursing note reviewed.  Constitutional:      General: She is awake. She is not in acute distress.    Appearance: She is well-developed. She is not ill-appearing.  HENT:     Head: Normocephalic and atraumatic.     Right Ear: Hearing, tympanic membrane, ear canal and external ear normal. No drainage.     Left Ear: Hearing, tympanic membrane, ear canal and external ear normal. No drainage.     Nose: Nose normal.     Right Sinus: No maxillary sinus tenderness or frontal sinus tenderness.     Left Sinus: No maxillary sinus tenderness or frontal sinus tenderness.     Mouth/Throat:     Mouth: Mucous membranes are moist.     Pharynx: Oropharynx is clear. Uvula midline. No pharyngeal swelling, oropharyngeal exudate or posterior oropharyngeal erythema.  Eyes:     General: Lids are normal.        Right eye: No discharge.  Left eye: No discharge.     Extraocular Movements: Extraocular movements intact.     Conjunctiva/sclera: Conjunctivae normal.     Pupils: Pupils are equal, round, and reactive to light.     Visual Fields: Right eye visual fields normal  and left eye visual fields normal.  Neck:     Thyroid: No thyromegaly.     Vascular: No carotid bruit.     Trachea: Trachea normal.  Cardiovascular:     Rate and Rhythm: Normal rate and regular rhythm.     Heart sounds: Normal heart sounds. No murmur heard.   No gallop.  Pulmonary:     Effort: Pulmonary effort is normal. No accessory muscle usage or respiratory distress.     Breath sounds: Normal breath sounds. Decreased air movement present.     Comments: On 3L O2 Chest:  Breasts:    Right: Normal.     Left: Normal.  Abdominal:     General: Bowel sounds are normal.     Palpations: Abdomen is soft. There is no hepatomegaly or splenomegaly.     Tenderness: There is no abdominal tenderness.  Musculoskeletal:        General: Normal range of motion.     Cervical back: Normal range of motion and neck supple.     Right lower leg: No edema.     Left lower leg: No edema.     Comments: Uses walker  Lymphadenopathy:     Head:     Right side of head: No submental, submandibular, tonsillar, preauricular or posterior auricular adenopathy.     Left side of head: No submental, submandibular, tonsillar, preauricular or posterior auricular adenopathy.     Cervical: No cervical adenopathy.     Upper Body:     Right upper body: No supraclavicular, axillary or pectoral adenopathy.     Left upper body: No supraclavicular, axillary or pectoral adenopathy.  Skin:    General: Skin is warm and dry.     Capillary Refill: Capillary refill takes less than 2 seconds.     Findings: No rash.  Neurological:     Mental Status: She is alert and oriented to person, place, and time.     Cranial Nerves: Cranial nerves are intact.     Gait: Gait is intact.     Deep Tendon Reflexes: Reflexes are normal and symmetric.     Reflex Scores:      Brachioradialis reflexes are 2+ on the right side and 2+ on the left side.      Patellar reflexes are 2+ on the right side and 2+ on the left side. Psychiatric:         Attention and Perception: Attention normal.        Mood and Affect: Mood normal.        Speech: Speech normal.        Behavior: Behavior normal. Behavior is cooperative.        Thought Content: Thought content normal.        Judgment: Judgment normal.    Results for orders placed or performed during the hospital encounter of 08/07/20  Resp Panel by RT-PCR (Flu A&B, Covid) Nasopharyngeal Swab   Specimen: Nasopharyngeal Swab; Nasopharyngeal(NP) swabs in vial transport medium  Result Value Ref Range   SARS Coronavirus 2 by RT PCR POSITIVE (A) NEGATIVE   Influenza A by PCR NEGATIVE NEGATIVE   Influenza B by PCR NEGATIVE NEGATIVE  MRSA Next Gen by PCR, Nasal   Specimen: Nasal Mucosa;  Nasal Swab  Result Value Ref Range   MRSA by PCR Next Gen NOT DETECTED NOT DETECTED  Culture, blood (Routine X 2) w Reflex to ID Panel   Specimen: BLOOD  Result Value Ref Range   Specimen Description BLOOD LEFT ANTECUBITAL    Special Requests      BOTTLES DRAWN AEROBIC AND ANAEROBIC Blood Culture adequate volume   Culture      NO GROWTH 5 DAYS Performed at [facility], [place] [address], [place], [place] [address]    Report Status 08/15/2020 FINAL   Protime-INR  Result Value Ref Range   Prothrombin Time 15.4 (H) 11.4 - 15.2 seconds   INR 1.2 0.8 - 1.2  APTT  Result Value Ref Range   aPTT 33 24 - 36 seconds  CBC  Result Value Ref Range   WBC 4.5 4.0 - 10.5 K/uL   RBC 4.21 3.87 - 5.11 MIL/uL   Hemoglobin 11.6 (L) 12.0 - 15.0 g/dL   HCT 38.4 36.0 - 46.0 %   MCV 91.2 80.0 - 100.0 fL   MCH 27.6 26.0 - 34.0 pg   MCHC 30.2 30.0 - 36.0 g/dL   RDW 16.9 (H) 11.5 - 15.5 %   Platelets 151 150 - 400 K/uL   nRBC 0.0 0.0 - 0.2 %  Differential  Result Value Ref Range   Neutrophils Relative % 58 %   Neutro Abs 2.6 1.7 - 7.7 K/uL   Lymphocytes Relative 24 %   Lymphs Abs 1.1 0.7 - 4.0 K/uL   Monocytes Relative 14 %   Monocytes Absolute 0.6 0.1 - 1.0 K/uL   Eosinophils Relative 3 %   Eosinophils  Absolute 0.1 0.0 - 0.5 K/uL   Basophils Relative 1 %   Basophils Absolute 0.0 0.0 - 0.1 K/uL   Immature Granulocytes 0 %   Abs Immature Granulocytes 0.01 0.00 - 0.07 K/uL  Comprehensive metabolic panel  Result Value Ref Range   Sodium 136 135 - 145 mmol/L   Potassium 5.1 3.5 - 5.1 mmol/L   Chloride 99 98 - 111 mmol/L   CO2 27 22 - 32 mmol/L   Glucose, Bld 112 (H) 70 - 99 mg/dL   BUN 63 (H) 8 - 23 mg/dL   Creatinine, Ser 2.50 (H) 0.44 - 1.00 mg/dL   Calcium 9.2 8.9 - 10.3 mg/dL   Total Protein 6.7 6.5 - 8.1 g/dL   Albumin 3.3 (L) 3.5 - 5.0 g/dL   AST 16 15 - 41 U/L   ALT 10 0 - 44 U/L   Alkaline Phosphatase 57 38 - 126 U/L   Total Bilirubin 1.4 (H) 0.3 - 1.2 mg/dL   GFR, Estimated 20 (L) >60 mL/min   Anion gap 10 5 - 15  Brain natriuretic peptide  Result Value Ref Range   B Natriuretic Peptide 1,647.8 (H) 0.0 - 100.0 pg/mL  CBC  Result Value Ref Range   WBC 4.5 4.0 - 10.5 K/uL   RBC 4.15 3.87 - 5.11 MIL/uL   Hemoglobin 11.8 (L) 12.0 - 15.0 g/dL   HCT 37.7 36.0 - 46.0 %   MCV 90.8 80.0 - 100.0 fL   MCH 28.4 26.0 - 34.0 pg   MCHC 31.3 30.0 - 36.0 g/dL   RDW 16.7 (H) 11.5 - 15.5 %   Platelets 143 (L) 150 - 400 K/uL   nRBC 0.0 0.0 - 0.2 %  Creatinine, serum  Result Value Ref Range   Creatinine, Ser 2.55 (H) 0.44 - 1.00 mg/dL  GFR, Estimated 20 (L) >60 mL/min  HIV Antibody (routine testing w rflx)  Result Value Ref Range   HIV Screen 4th Generation wRfx Non Reactive Non Reactive  Basic metabolic panel  Result Value Ref Range   Sodium 138 135 - 145 mmol/L   Potassium 4.7 3.5 - 5.1 mmol/L   Chloride 100 98 - 111 mmol/L   CO2 28 22 - 32 mmol/L   Glucose, Bld 101 (H) 70 - 99 mg/dL   BUN 61 (H) 8 - 23 mg/dL   Creatinine, Ser 2.36 (H) 0.44 - 1.00 mg/dL   Calcium 9.0 8.9 - 10.3 mg/dL   GFR, Estimated 22 (L) >60 mL/min   Anion gap 10 5 - 15  Blood gas, arterial  Result Value Ref Range   FIO2 36.00    pH, Arterial 7.369 7.350 - 7.450   pCO2 arterial 47.9 32.0 - 48.0  mmHg   pO2, Arterial 81.3 (L) 83.0 - 108.0 mmHg   Bicarbonate 27.0 20.0 - 28.0 mmol/L   Acid-Base Excess 2.2 (H) 0.0 - 2.0 mmol/L   O2 Saturation 96.1 %   Patient temperature 37.0    Collection site RIGHT RADIAL    Drawn by [phone]    Sample type ARTERIAL    Allens test (pass/fail) PASS PASS  D-dimer, quantitative  Result Value Ref Range   D-Dimer, Quant 1.21 (H) 0.00 - 0.50 ug/mL-FEU  C-reactive protein  Result Value Ref Range   CRP 0.8 <1.0 mg/dL  Procalcitonin - Baseline  Result Value Ref Range   Procalcitonin <0.10 ng/mL  Heparin level (unfractionated)  Result Value Ref Range   Heparin Unfractionated 0.85 (H) 0.30 - 0.70 IU/mL  Basic metabolic panel  Result Value Ref Range   Sodium 137 135 - 145 mmol/L   Potassium 4.8 3.5 - 5.1 mmol/L   Chloride 99 98 - 111 mmol/L   CO2 28 22 - 32 mmol/L   Glucose, Bld 115 (H) 70 - 99 mg/dL   BUN 58 (H) 8 - 23 mg/dL   Creatinine, Ser 2.10 (H) 0.44 - 1.00 mg/dL   Calcium 9.2 8.9 - 10.3 mg/dL   GFR, Estimated 25 (L) >60 mL/min   Anion gap 10 5 - 15  Magnesium  Result Value Ref Range   Magnesium 2.4 1.7 - 2.4 mg/dL  Procalcitonin  Result Value Ref Range   Procalcitonin <0.10 ng/mL  CBC  Result Value Ref Range   WBC 5.8 4.0 - 10.5 K/uL   RBC 4.14 3.87 - 5.11 MIL/uL   Hemoglobin 11.6 (L) 12.0 - 15.0 g/dL   HCT 37.9 36.0 - 46.0 %   MCV 91.5 80.0 - 100.0 fL   MCH 28.0 26.0 - 34.0 pg   MCHC 30.6 30.0 - 36.0 g/dL   RDW 17.0 (H) 11.5 - 15.5 %   Platelets 148 (L) 150 - 400 K/uL   nRBC 0.3 (H) 0.0 - 0.2 %  Heparin level (unfractionated)  Result Value Ref Range   Heparin Unfractionated 1.02 (H) 0.30 - 0.70 IU/mL  Heparin level (unfractionated)  Result Value Ref Range   Heparin Unfractionated 0.80 (H) 0.30 - 0.70 IU/mL  Basic metabolic panel  Result Value Ref Range   Sodium 136 135 - 145 mmol/L   Potassium 4.2 3.5 - 5.1 mmol/L   Chloride 97 (L) 98 - 111 mmol/L   CO2 29 22 - 32 mmol/L   Glucose, Bld 137 (H) 70 - 99 mg/dL   BUN 55  (H) 8 - 23 mg/dL  Creatinine, Ser 2.09 (H) 0.44 - 1.00 mg/dL   Calcium 9.0 8.9 - 10.3 mg/dL   GFR, Estimated 25 (L) >60 mL/min   Anion gap 10 5 - 15  Procalcitonin  Result Value Ref Range   Procalcitonin 0.16 ng/mL  Heparin level (unfractionated)  Result Value Ref Range   Heparin Unfractionated 0.55 0.30 - 0.70 IU/mL  CBC  Result Value Ref Range   WBC 5.2 4.0 - 10.5 K/uL   RBC 4.17 3.87 - 5.11 MIL/uL   Hemoglobin 11.7 (L) 12.0 - 15.0 g/dL   HCT 38.0 36.0 - 46.0 %   MCV 91.1 80.0 - 100.0 fL   MCH 28.1 26.0 - 34.0 pg   MCHC 30.8 30.0 - 36.0 g/dL   RDW 17.2 (H) 11.5 - 15.5 %   Platelets 151 150 - 400 K/uL   nRBC 1.1 (H) 0.0 - 0.2 %  CBC  Result Value Ref Range   WBC 5.3 4.0 - 10.5 K/uL   RBC 4.03 3.87 - 5.11 MIL/uL   Hemoglobin 11.3 (L) 12.0 - 15.0 g/dL   HCT 37.1 36.0 - 46.0 %   MCV 92.1 80.0 - 100.0 fL   MCH 28.0 26.0 - 34.0 pg   MCHC 30.5 30.0 - 36.0 g/dL   RDW 17.4 (H) 11.5 - 15.5 %   Platelets 153 150 - 400 K/uL   nRBC 0.8 (H) 0.0 - 0.2 %  Basic metabolic panel  Result Value Ref Range   Sodium 138 135 - 145 mmol/L   Potassium 4.8 3.5 - 5.1 mmol/L   Chloride 98 98 - 111 mmol/L   CO2 32 22 - 32 mmol/L   Glucose, Bld 110 (H) 70 - 99 mg/dL   BUN 47 (H) 8 - 23 mg/dL   Creatinine, Ser 2.02 (H) 0.44 - 1.00 mg/dL   Calcium 9.4 8.9 - 10.3 mg/dL   GFR, Estimated 26 (L) >60 mL/min   Anion gap 8 5 - 15  I-stat chem 8, ED  Result Value Ref Range   Sodium 136 135 - 145 mmol/L   Potassium 5.0 3.5 - 5.1 mmol/L   Chloride 99 98 - 111 mmol/L   BUN 57 (H) 8 - 23 mg/dL   Creatinine, Ser 2.40 (H) 0.44 - 1.00 mg/dL   Glucose, Bld 113 (H) 70 - 99 mg/dL   Calcium, Ion 1.11 (L) 1.15 - 1.40 mmol/L   TCO2 29 22 - 32 mmol/L   Hemoglobin 13.3 12.0 - 15.0 g/dL   HCT 39.0 36.0 - 46.0 %  CBG monitoring, ED  Result Value Ref Range   Glucose-Capillary 115 (H) 70 - 99 mg/dL   Comment 1 Notify RN   ECHOCARDIOGRAM COMPLETE  Result Value Ref Range   Height 63 in   BP 97/57 mmHg   AV  Area VTI 1.26 cm2   AV Mean grad 3.0 mmHg   S' Lateral 6.40 cm   P 1/2 time 472 msec   AR max vel 1.18 cm2   AV Peak grad 6.4 mmHg   Ao pk vel 1.26 m/s   MV M vel 4.74 m/s   Radius 0.90 cm   AV Area mean vel 1.12 cm2   MV Peak grad 89.9 mmHg  Troponin I (High Sensitivity)  Result Value Ref Range   Troponin I (High Sensitivity) 61 (H) <18 ng/L  Troponin I (High Sensitivity)  Result Value Ref Range   Troponin I (High Sensitivity) 62 (H) <18 ng/L      Assessment & Plan:  Problem List Items Addressed This Visit       Cardiovascular and Mediastinum   Chronic systolic congestive heart failure, NYHA class 3 (HCC)    Stable, continue present medications. Followed by Cardiology. Labs ordered today. Had new battery placed in pacemaker on 09/14/2020.        Respiratory   COPD, severe (HCC)    Stable on 3L of O2. Following with pulmonology. Continue to monitor. Continue use of Oxygen for SOB.        Endocrine   IFG (impaired fasting glucose)    Labs ordered today. Will make recommendations based on lab results.       Relevant Orders   HgB A1c     Genitourinary   Chronic kidney disease, stage 3a ([place])    Labs ordered today. Followed by Nephrology. Continue to follow per their recommendations.        Other   Hypercholesteremia    Chronic.  Controlled.  Continue with current medication regimen.  Labs ordered today.  Return to clinic in 3 months for reevaluation.  Call sooner if concerns arise.        Relevant Orders   Lipid panel   Other Visit Diagnoses     Annual physical exam    -  Primary   Relevant Orders   CBC with Differential/Platelet   Comprehensive metabolic panel   Lipid panel   TSH   Urinalysis, Routine w reflex microscopic   HgB A1c        Follow up plan: Return in about 3 months (around 12/23/2020) for HTN, HLD, DM2 FU.   LABORATORY TESTING:  - Pap smear: not applicable  IMMUNIZATIONS:   - Tdap: Tetanus vaccination status reviewed: last  tetanus booster within 10 years. - Influenza: Up to date - Pneumovax: Up to date - Prevnar: Up to date - HPV: Not applicable - Zostavax vaccine: Up to date  SCREENING: -Mammogram: Refused  - Colonoscopy: Up to date  - Bone Density: Refused  -Hearing Test: Not applicable  -Spirometry: Not applicable   PATIENT COUNSELING:   Advised to take 1 mg of folate supplement per day if capable of pregnancy.   Sexuality: Discussed sexually transmitted diseases, partner selection, use of condoms, avoidance of unintended pregnancy  and contraceptive alternatives.   Advised to avoid cigarette smoking.  I discussed with the patient that most people either abstain from alcohol or drink within safe limits (<=14/week and <=4 drinks/occasion for males, <=7/weeks and <= 3 drinks/occasion for females) and that the risk for alcohol disorders and other health effects rises proportionally with the number of drinks per week and how often a drinker exceeds daily limits.  Discussed cessation/primary prevention of drug use and availability of treatment for abuse.   Diet: Encouraged to adjust caloric intake to maintain  or achieve ideal body weight, to reduce intake of dietary saturated fat and total fat, to limit sodium intake by avoiding high sodium foods and not adding table salt, and to maintain adequate dietary potassium and calcium preferably from fresh fruits, vegetables, and low-fat dairy products.    stressed the importance of regular exercise  Injury prevention: Discussed safety belts, safety helmets, smoke detector, smoking near bedding or upholstery.   Dental health: Discussed importance of regular tooth brushing, flossing, and dental visits.    NEXT PREVENTATIVE PHYSICAL DUE IN 1 YEAR. Return in about 3 months (around 12/23/2020) for HTN, HLD, DM2 FU.

## 2020-09-23 ENCOUNTER — Encounter: Payer: Self-pay | Admitting: Nurse Practitioner

## 2020-09-23 ENCOUNTER — Encounter: Payer: Medicare Other | Admitting: Nurse Practitioner

## 2020-09-23 ENCOUNTER — Other Ambulatory Visit: Payer: Self-pay

## 2020-09-23 ENCOUNTER — Ambulatory Visit (INDEPENDENT_AMBULATORY_CARE_PROVIDER_SITE_OTHER): Payer: Medicare Other | Admitting: Nurse Practitioner

## 2020-09-23 VITALS — BP 101/62 | HR 72 | Temp 97.9°F | Ht 62.5 in | Wt 170.2 lb

## 2020-09-23 DIAGNOSIS — I081 Rheumatic disorders of both mitral and tricuspid valves: Secondary | ICD-10-CM | POA: Diagnosis not present

## 2020-09-23 DIAGNOSIS — E7800 Pure hypercholesterolemia, unspecified: Secondary | ICD-10-CM

## 2020-09-23 DIAGNOSIS — I130 Hypertensive heart and chronic kidney disease with heart failure and stage 1 through stage 4 chronic kidney disease, or unspecified chronic kidney disease: Secondary | ICD-10-CM | POA: Diagnosis not present

## 2020-09-23 DIAGNOSIS — Z0000 Encounter for general adult medical examination without abnormal findings: Secondary | ICD-10-CM

## 2020-09-23 DIAGNOSIS — Z9181 History of falling: Secondary | ICD-10-CM | POA: Diagnosis not present

## 2020-09-23 DIAGNOSIS — J449 Chronic obstructive pulmonary disease, unspecified: Secondary | ICD-10-CM

## 2020-09-23 DIAGNOSIS — Z23 Encounter for immunization: Secondary | ICD-10-CM | POA: Diagnosis not present

## 2020-09-23 DIAGNOSIS — Z7901 Long term (current) use of anticoagulants: Secondary | ICD-10-CM | POA: Diagnosis not present

## 2020-09-23 DIAGNOSIS — N200 Calculus of kidney: Secondary | ICD-10-CM | POA: Diagnosis not present

## 2020-09-23 DIAGNOSIS — I420 Dilated cardiomyopathy: Secondary | ICD-10-CM | POA: Diagnosis not present

## 2020-09-23 DIAGNOSIS — Z8616 Personal history of COVID-19: Secondary | ICD-10-CM | POA: Diagnosis not present

## 2020-09-23 DIAGNOSIS — Z87891 Personal history of nicotine dependence: Secondary | ICD-10-CM | POA: Diagnosis not present

## 2020-09-23 DIAGNOSIS — N1831 Chronic kidney disease, stage 3a: Secondary | ICD-10-CM | POA: Diagnosis not present

## 2020-09-23 DIAGNOSIS — E039 Hypothyroidism, unspecified: Secondary | ICD-10-CM

## 2020-09-23 DIAGNOSIS — I5023 Acute on chronic systolic (congestive) heart failure: Secondary | ICD-10-CM | POA: Diagnosis not present

## 2020-09-23 DIAGNOSIS — I5022 Chronic systolic (congestive) heart failure: Secondary | ICD-10-CM | POA: Diagnosis not present

## 2020-09-23 DIAGNOSIS — J9620 Acute and chronic respiratory failure, unspecified whether with hypoxia or hypercapnia: Secondary | ICD-10-CM | POA: Diagnosis not present

## 2020-09-23 DIAGNOSIS — R8290 Unspecified abnormal findings in urine: Secondary | ICD-10-CM

## 2020-09-23 DIAGNOSIS — I2510 Atherosclerotic heart disease of native coronary artery without angina pectoris: Secondary | ICD-10-CM | POA: Diagnosis not present

## 2020-09-23 DIAGNOSIS — F32A Depression, unspecified: Secondary | ICD-10-CM | POA: Diagnosis not present

## 2020-09-23 DIAGNOSIS — I4891 Unspecified atrial fibrillation: Secondary | ICD-10-CM | POA: Diagnosis not present

## 2020-09-23 DIAGNOSIS — R7301 Impaired fasting glucose: Secondary | ICD-10-CM

## 2020-09-23 DIAGNOSIS — I447 Left bundle-branch block, unspecified: Secondary | ICD-10-CM | POA: Diagnosis not present

## 2020-09-23 DIAGNOSIS — G4700 Insomnia, unspecified: Secondary | ICD-10-CM | POA: Diagnosis not present

## 2020-09-23 DIAGNOSIS — G2581 Restless legs syndrome: Secondary | ICD-10-CM | POA: Diagnosis not present

## 2020-09-23 DIAGNOSIS — Z7951 Long term (current) use of inhaled steroids: Secondary | ICD-10-CM | POA: Diagnosis not present

## 2020-09-23 DIAGNOSIS — Z9981 Dependence on supplemental oxygen: Secondary | ICD-10-CM | POA: Diagnosis not present

## 2020-09-23 LAB — URINALYSIS, ROUTINE W REFLEX MICROSCOPIC
Bilirubin, UA: NEGATIVE
Glucose, UA: NEGATIVE
Ketones, UA: NEGATIVE
Nitrite, UA: NEGATIVE
Specific Gravity, UA: 1.015 (ref 1.005–1.030)
Urobilinogen, Ur: 0.2 mg/dL (ref 0.2–1.0)
pH, UA: 6 (ref 5.0–7.5)

## 2020-09-23 LAB — MICROSCOPIC EXAMINATION: Bacteria, UA: NONE SEEN

## 2020-09-23 MED ORDER — NYSTATIN 100000 UNIT/GM EX CREA: 1.0000 "application " | TOPICAL_CREAM | Freq: Two times a day (BID) | CUTANEOUS | 1 refills | Status: DC

## 2020-09-23 NOTE — Assessment & Plan Note (Addendum)
Stable, continue present medications. Followed by Cardiology. Labs ordered today. Had new battery placed in pacemaker on 09/14/2020.

## 2020-09-23 NOTE — Assessment & Plan Note (Signed)
Labs ordered today. Followed by Nephrology. Continue to follow per their recommendations.

## 2020-09-23 NOTE — Assessment & Plan Note (Signed)
Labs ordered today.  Will make recommendations based on lab results. ?

## 2020-09-23 NOTE — Assessment & Plan Note (Signed)
Chronic.  Controlled.  Continue with current medication regimen.  Labs ordered today.  Return to clinic in 3 months for reevaluation.  Call sooner if concerns arise.   

## 2020-09-23 NOTE — Addendum Note (Signed)
Addended by: Jon Billings on: 09/23/2020 11:53 AM   Modules accepted: Orders

## 2020-09-23 NOTE — Assessment & Plan Note (Signed)
Stable on 3L of O2. Following with pulmonology. Continue to monitor. Continue use of Oxygen for SOB.

## 2020-09-23 NOTE — Addendum Note (Signed)
Addended by: Georgina Peer on: 09/23/2020 11:12 AM   Modules accepted: Orders

## 2020-09-24 DIAGNOSIS — J9620 Acute and chronic respiratory failure, unspecified whether with hypoxia or hypercapnia: Secondary | ICD-10-CM | POA: Diagnosis not present

## 2020-09-24 DIAGNOSIS — I130 Hypertensive heart and chronic kidney disease with heart failure and stage 1 through stage 4 chronic kidney disease, or unspecified chronic kidney disease: Secondary | ICD-10-CM | POA: Diagnosis not present

## 2020-09-24 DIAGNOSIS — Z9181 History of falling: Secondary | ICD-10-CM | POA: Diagnosis not present

## 2020-09-24 DIAGNOSIS — Z7951 Long term (current) use of inhaled steroids: Secondary | ICD-10-CM | POA: Diagnosis not present

## 2020-09-24 DIAGNOSIS — I420 Dilated cardiomyopathy: Secondary | ICD-10-CM | POA: Diagnosis not present

## 2020-09-24 DIAGNOSIS — N200 Calculus of kidney: Secondary | ICD-10-CM | POA: Diagnosis not present

## 2020-09-24 DIAGNOSIS — G4700 Insomnia, unspecified: Secondary | ICD-10-CM | POA: Diagnosis not present

## 2020-09-24 DIAGNOSIS — Z7901 Long term (current) use of anticoagulants: Secondary | ICD-10-CM | POA: Diagnosis not present

## 2020-09-24 DIAGNOSIS — Z87891 Personal history of nicotine dependence: Secondary | ICD-10-CM | POA: Diagnosis not present

## 2020-09-24 DIAGNOSIS — J449 Chronic obstructive pulmonary disease, unspecified: Secondary | ICD-10-CM | POA: Diagnosis not present

## 2020-09-24 DIAGNOSIS — I2510 Atherosclerotic heart disease of native coronary artery without angina pectoris: Secondary | ICD-10-CM | POA: Diagnosis not present

## 2020-09-24 DIAGNOSIS — I4891 Unspecified atrial fibrillation: Secondary | ICD-10-CM | POA: Diagnosis not present

## 2020-09-24 DIAGNOSIS — I081 Rheumatic disorders of both mitral and tricuspid valves: Secondary | ICD-10-CM | POA: Diagnosis not present

## 2020-09-24 DIAGNOSIS — Z9981 Dependence on supplemental oxygen: Secondary | ICD-10-CM | POA: Diagnosis not present

## 2020-09-24 DIAGNOSIS — Z8616 Personal history of COVID-19: Secondary | ICD-10-CM | POA: Diagnosis not present

## 2020-09-24 DIAGNOSIS — N1831 Chronic kidney disease, stage 3a: Secondary | ICD-10-CM | POA: Diagnosis not present

## 2020-09-24 DIAGNOSIS — I5023 Acute on chronic systolic (congestive) heart failure: Secondary | ICD-10-CM | POA: Diagnosis not present

## 2020-09-24 DIAGNOSIS — G2581 Restless legs syndrome: Secondary | ICD-10-CM | POA: Diagnosis not present

## 2020-09-24 DIAGNOSIS — F32A Depression, unspecified: Secondary | ICD-10-CM | POA: Diagnosis not present

## 2020-09-24 DIAGNOSIS — I447 Left bundle-branch block, unspecified: Secondary | ICD-10-CM | POA: Diagnosis not present

## 2020-09-24 LAB — COMPREHENSIVE METABOLIC PANEL
ALT: 8 IU/L (ref 0–32)
AST: 17 IU/L (ref 0–40)
Albumin/Globulin Ratio: 1.2 (ref 1.2–2.2)
Albumin: 3.9 g/dL (ref 3.8–4.8)
Alkaline Phosphatase: 63 IU/L (ref 44–121)
BUN/Creatinine Ratio: 17 (ref 12–28)
BUN: 31 mg/dL — ABNORMAL HIGH (ref 8–27)
Bilirubin Total: 0.9 mg/dL (ref 0.0–1.2)
CO2: 35 mmol/L — ABNORMAL HIGH (ref 20–29)
Calcium: 9.6 mg/dL (ref 8.7–10.3)
Chloride: 91 mmol/L — ABNORMAL LOW (ref 96–106)
Creatinine, Ser: 1.81 mg/dL — ABNORMAL HIGH (ref 0.57–1.00)
Globulin, Total: 3.2 g/dL (ref 1.5–4.5)
Glucose: 101 mg/dL — ABNORMAL HIGH (ref 65–99)
Potassium: 4.2 mmol/L (ref 3.5–5.2)
Sodium: 140 mmol/L (ref 134–144)
Total Protein: 7.1 g/dL (ref 6.0–8.5)
eGFR: 30 mL/min/{1.73_m2} — ABNORMAL LOW (ref >59)

## 2020-09-24 LAB — CBC WITH DIFFERENTIAL/PLATELET
Basophils Absolute: 0 10*3/uL (ref 0.0–0.2)
Basos: 1 %
EOS (ABSOLUTE): 0.2 10*3/uL (ref 0.0–0.4)
Eos: 4 %
Hematocrit: 38.1 % (ref 34.0–46.6)
Hemoglobin: 11.7 g/dL (ref 11.1–15.9)
Immature Grans (Abs): 0 10*3/uL (ref 0.0–0.1)
Immature Granulocytes: 0 %
Lymphocytes Absolute: 1.3 10*3/uL (ref 0.7–3.1)
Lymphs: 29 %
MCH: 26.7 pg (ref 26.6–33.0)
MCHC: 30.7 g/dL — ABNORMAL LOW (ref 31.5–35.7)
MCV: 87 fL (ref 79–97)
Monocytes Absolute: 0.5 10*3/uL (ref 0.1–0.9)
Monocytes: 10 %
Neutrophils Absolute: 2.5 10*3/uL (ref 1.4–7.0)
Neutrophils: 56 %
Platelets: 145 10*3/uL — ABNORMAL LOW (ref 150–450)
RBC: 4.39 x10E6/uL (ref 3.77–5.28)
RDW: 17.8 % — ABNORMAL HIGH (ref 11.7–15.4)
WBC: 4.4 10*3/uL (ref 3.4–10.8)

## 2020-09-24 LAB — LIPID PANEL
Chol/HDL Ratio: 2.4 ratio (ref 0.0–4.4)
Cholesterol, Total: 124 mg/dL (ref 100–199)
HDL: 51 mg/dL (ref >39)
LDL Chol Calc (NIH): 53 mg/dL (ref 0–99)
Triglycerides: 113 mg/dL (ref 0–149)
VLDL Cholesterol Cal: 20 mg/dL (ref 5–40)

## 2020-09-24 LAB — HEMOGLOBIN A1C
Est. average glucose Bld gHb Est-mCnc: 120 mg/dL
Hgb A1c MFr Bld: 5.8 % — ABNORMAL HIGH (ref 4.8–5.6)

## 2020-09-24 LAB — TSH: TSH: 214 u[IU]/mL — ABNORMAL HIGH (ref 0.450–4.500)

## 2020-09-24 NOTE — Addendum Note (Signed)
Addended by: Jon Billings on: 09/24/2020 01:11 PM   Modules accepted: Orders

## 2020-09-24 NOTE — Progress Notes (Signed)
Called and spoke with patient in regards to her labs.  Reviewed her blood work with her. Discussed that the thyroid lab is abnormal and needs to be redrawn. Requested that patient come in on Monday to have labs repeated. Patient is checking with her daughter and will call back to make a lab appointment.

## 2020-09-27 LAB — URINE CULTURE

## 2020-09-27 NOTE — Progress Notes (Signed)
Please let patient know that her urine culture did not grow any bacteria.  She does not need antibiotics.

## 2020-09-28 ENCOUNTER — Telehealth: Payer: Self-pay

## 2020-09-28 DIAGNOSIS — I428 Other cardiomyopathies: Secondary | ICD-10-CM | POA: Diagnosis not present

## 2020-09-28 DIAGNOSIS — J410 Simple chronic bronchitis: Secondary | ICD-10-CM | POA: Diagnosis not present

## 2020-09-28 DIAGNOSIS — I5022 Chronic systolic (congestive) heart failure: Secondary | ICD-10-CM | POA: Diagnosis not present

## 2020-09-28 DIAGNOSIS — I10 Essential (primary) hypertension: Secondary | ICD-10-CM | POA: Diagnosis not present

## 2020-09-28 DIAGNOSIS — Z9889 Other specified postprocedural states: Secondary | ICD-10-CM | POA: Diagnosis not present

## 2020-09-28 DIAGNOSIS — I447 Left bundle-branch block, unspecified: Secondary | ICD-10-CM | POA: Diagnosis not present

## 2020-09-28 DIAGNOSIS — Z95810 Presence of automatic (implantable) cardiac defibrillator: Secondary | ICD-10-CM | POA: Diagnosis not present

## 2020-09-28 DIAGNOSIS — R0602 Shortness of breath: Secondary | ICD-10-CM | POA: Diagnosis not present

## 2020-09-28 NOTE — Telephone Encounter (Signed)
-----   Message from Jon Billings, NP sent at 09/27/2020 12:44 PM EDT ----- Please let patient know that her urine culture did not grow any bacteria.  She does not need antibiotics.

## 2020-09-28 NOTE — Telephone Encounter (Signed)
Informed patient of results and recommendations. 

## 2020-09-29 ENCOUNTER — Other Ambulatory Visit: Payer: Medicare Other

## 2020-09-29 DIAGNOSIS — Z87891 Personal history of nicotine dependence: Secondary | ICD-10-CM | POA: Diagnosis not present

## 2020-09-29 DIAGNOSIS — N1831 Chronic kidney disease, stage 3a: Secondary | ICD-10-CM | POA: Diagnosis not present

## 2020-09-29 DIAGNOSIS — I4891 Unspecified atrial fibrillation: Secondary | ICD-10-CM | POA: Diagnosis not present

## 2020-09-29 DIAGNOSIS — J449 Chronic obstructive pulmonary disease, unspecified: Secondary | ICD-10-CM | POA: Diagnosis not present

## 2020-09-29 DIAGNOSIS — Z8616 Personal history of COVID-19: Secondary | ICD-10-CM | POA: Diagnosis not present

## 2020-09-29 DIAGNOSIS — J9620 Acute and chronic respiratory failure, unspecified whether with hypoxia or hypercapnia: Secondary | ICD-10-CM | POA: Diagnosis not present

## 2020-09-29 DIAGNOSIS — G2581 Restless legs syndrome: Secondary | ICD-10-CM | POA: Diagnosis not present

## 2020-09-29 DIAGNOSIS — N200 Calculus of kidney: Secondary | ICD-10-CM | POA: Diagnosis not present

## 2020-09-29 DIAGNOSIS — F32A Depression, unspecified: Secondary | ICD-10-CM | POA: Diagnosis not present

## 2020-09-29 DIAGNOSIS — Z9181 History of falling: Secondary | ICD-10-CM | POA: Diagnosis not present

## 2020-09-29 DIAGNOSIS — I2510 Atherosclerotic heart disease of native coronary artery without angina pectoris: Secondary | ICD-10-CM | POA: Diagnosis not present

## 2020-09-29 DIAGNOSIS — Z9981 Dependence on supplemental oxygen: Secondary | ICD-10-CM | POA: Diagnosis not present

## 2020-09-29 DIAGNOSIS — G4700 Insomnia, unspecified: Secondary | ICD-10-CM | POA: Diagnosis not present

## 2020-09-29 DIAGNOSIS — I447 Left bundle-branch block, unspecified: Secondary | ICD-10-CM | POA: Diagnosis not present

## 2020-09-29 DIAGNOSIS — I130 Hypertensive heart and chronic kidney disease with heart failure and stage 1 through stage 4 chronic kidney disease, or unspecified chronic kidney disease: Secondary | ICD-10-CM | POA: Diagnosis not present

## 2020-09-29 DIAGNOSIS — I5023 Acute on chronic systolic (congestive) heart failure: Secondary | ICD-10-CM | POA: Diagnosis not present

## 2020-09-29 DIAGNOSIS — Z7951 Long term (current) use of inhaled steroids: Secondary | ICD-10-CM | POA: Diagnosis not present

## 2020-09-29 DIAGNOSIS — Z7901 Long term (current) use of anticoagulants: Secondary | ICD-10-CM | POA: Diagnosis not present

## 2020-09-29 DIAGNOSIS — I081 Rheumatic disorders of both mitral and tricuspid valves: Secondary | ICD-10-CM | POA: Diagnosis not present

## 2020-09-29 DIAGNOSIS — I420 Dilated cardiomyopathy: Secondary | ICD-10-CM | POA: Diagnosis not present

## 2020-10-01 ENCOUNTER — Other Ambulatory Visit: Payer: Self-pay | Admitting: Nurse Practitioner

## 2020-10-01 DIAGNOSIS — F32A Depression, unspecified: Secondary | ICD-10-CM | POA: Diagnosis not present

## 2020-10-01 DIAGNOSIS — N1831 Chronic kidney disease, stage 3a: Secondary | ICD-10-CM | POA: Diagnosis not present

## 2020-10-01 DIAGNOSIS — I130 Hypertensive heart and chronic kidney disease with heart failure and stage 1 through stage 4 chronic kidney disease, or unspecified chronic kidney disease: Secondary | ICD-10-CM | POA: Diagnosis not present

## 2020-10-01 DIAGNOSIS — G2581 Restless legs syndrome: Secondary | ICD-10-CM | POA: Diagnosis not present

## 2020-10-01 DIAGNOSIS — J449 Chronic obstructive pulmonary disease, unspecified: Secondary | ICD-10-CM | POA: Diagnosis not present

## 2020-10-01 DIAGNOSIS — Z87891 Personal history of nicotine dependence: Secondary | ICD-10-CM | POA: Diagnosis not present

## 2020-10-01 DIAGNOSIS — I081 Rheumatic disorders of both mitral and tricuspid valves: Secondary | ICD-10-CM | POA: Diagnosis not present

## 2020-10-01 DIAGNOSIS — N200 Calculus of kidney: Secondary | ICD-10-CM | POA: Diagnosis not present

## 2020-10-01 DIAGNOSIS — I2510 Atherosclerotic heart disease of native coronary artery without angina pectoris: Secondary | ICD-10-CM | POA: Diagnosis not present

## 2020-10-01 DIAGNOSIS — J9620 Acute and chronic respiratory failure, unspecified whether with hypoxia or hypercapnia: Secondary | ICD-10-CM | POA: Diagnosis not present

## 2020-10-01 DIAGNOSIS — Z8616 Personal history of COVID-19: Secondary | ICD-10-CM | POA: Diagnosis not present

## 2020-10-01 DIAGNOSIS — I4891 Unspecified atrial fibrillation: Secondary | ICD-10-CM | POA: Diagnosis not present

## 2020-10-01 DIAGNOSIS — G4700 Insomnia, unspecified: Secondary | ICD-10-CM | POA: Diagnosis not present

## 2020-10-01 DIAGNOSIS — Z9181 History of falling: Secondary | ICD-10-CM | POA: Diagnosis not present

## 2020-10-01 DIAGNOSIS — I5023 Acute on chronic systolic (congestive) heart failure: Secondary | ICD-10-CM | POA: Diagnosis not present

## 2020-10-01 DIAGNOSIS — Z7951 Long term (current) use of inhaled steroids: Secondary | ICD-10-CM | POA: Diagnosis not present

## 2020-10-01 DIAGNOSIS — I420 Dilated cardiomyopathy: Secondary | ICD-10-CM | POA: Diagnosis not present

## 2020-10-01 DIAGNOSIS — Z9981 Dependence on supplemental oxygen: Secondary | ICD-10-CM | POA: Diagnosis not present

## 2020-10-01 DIAGNOSIS — Z7901 Long term (current) use of anticoagulants: Secondary | ICD-10-CM | POA: Diagnosis not present

## 2020-10-01 DIAGNOSIS — I447 Left bundle-branch block, unspecified: Secondary | ICD-10-CM | POA: Diagnosis not present

## 2020-10-04 ENCOUNTER — Ambulatory Visit: Payer: Medicare Other | Admitting: Nurse Practitioner

## 2020-10-06 NOTE — Progress Notes (Deleted)
There were no vitals taken for this visit.   Subjective:    Patient ID: Maria Neal, female    DOB: 1950-10-06, 70 y.o.   MRN: [id number]  HPI: Maria Neal is a 70 y.o. female  No chief complaint on file.  HYPERTENSION / HYPERLIPIDEMIA Satisfied with current treatment? {Blank single:19197::"yes","no"} Duration of hypertension: {Blank single:19197::"chronic","months","years"} BP monitoring frequency: {Blank single:19197::"not checking","rarely","daily","weekly","monthly","a few times a day","a few times a week","a few times a month"} BP range:  BP medication side effects: {Blank single:19197::"yes","no"} Past BP meds: {Blank LKTGYBWL:89373::"SKAJ","GOTLXBWIOM","BTDHRCBULA/GTXMIWOEHO","ZYYQMGNO","IBBCWUGQBV","QXIHWTUUEK/CMKL","KJZPHXTAVW (bystolic)","carvedilol","chlorthalidone","clonidine","diltiazem","exforge HCT","HCTZ","irbesartan (avapro)","labetalol","lisinopril","lisinopril-HCTZ","losartan (cozaar)","methyldopa","nifedipine","olmesartan (benicar)","olmesartan-HCTZ","quinapril","ramipril","spironalactone","tekturna","valsartan","valsartan-HCTZ","verapamil"} Duration of hyperlipidemia: {Blank single:19197::"chronic","months","years"} Cholesterol medication side effects: {Blank single:19197::"yes","no"} Cholesterol supplements: {Blank multiple:19196::"none","fish oil","niacin","red yeast rice"} Past cholesterol medications: {Blank multiple:19196::"none","atorvastain (lipitor)","lovastatin (mevacor)","pravastatin (pravachol)","rosuvastatin (crestor)","simvastatin (zocor)","vytorin","fenofibrate (tricor)","gemfibrozil","ezetimide (zetia)","niaspan","lovaza"} Medication compliance: {Blank single:19197::"excellent compliance","good compliance","fair compliance","poor compliance"} Aspirin: {Blank single:19197::"yes","no"} Recent stressors: {Blank single:19197::"yes","no"} Recurrent headaches: {Blank single:19197::"yes","no"} Visual changes: {Blank single:19197::"yes","no"} Palpitations:  {Blank single:19197::"yes","no"} Dyspnea: {Blank single:19197::"yes","no"} Chest pain: {Blank single:19197::"yes","no"} Lower extremity edema: {Blank single:19197::"yes","no"} Dizzy/lightheaded: {Blank single:19197::"yes","no"}  COPD COPD status: {Blank single:19197::"controlled","uncontrolled","better","worse","exacerbated","stable"} Satisfied with current treatment?: {Blank single:19197::"yes","no"} Oxygen use: {Blank single:19197::"yes","no"} Dyspnea frequency:  Cough frequency:  Rescue inhaler frequency:   Limitation of activity: {Blank single:19197::"yes","no"} Productive cough:  Last Spirometry:  Pneumovax: {Blank single:19197::"Up to Date","Not up to Date","unknown"} Influenza: {Blank single:19197::"Up to Date","Not up to Date","unknown"}  CHRONIC KIDNEY DISEASE CKD status: {Blank single:19197::"controlled","uncontrolled","better","worse","exacerbated","stable"} Medications renally dose: {Blank single:19197::"yes","no"} Previous renal evaluation: {Blank single:19197::"yes","no"} Pneumovax:  {Blank single:19197::"Up to Date","Not up to Date","unknown"} Influenza Vaccine:  {Blank single:19197::"Up to Date","Not up to Date","unknown"}  HYPOTHYROIDISM Thyroid control status:{Blank single:19197::"controlled","uncontrolled","better","worse","exacerbated","stable"} Satisfied with current treatment? {Blank single:19197::"yes","no"} Medication side effects: {Blank single:19197::"yes","no"} Medication compliance: {Blank single:19197::"excellent compliance","good compliance","fair compliance","poor compliance"} Etiology of hypothyroidism:  Recent dose adjustment:{Blank single:19197::"yes","no"} Fatigue: {Blank single:19197::"yes","no"} Cold intolerance: {Blank single:19197::"yes","no"} Heat intolerance: {Blank single:19197::"yes","no"} Weight gain: {Blank single:19197::"yes","no"} Weight loss: {Blank single:19197::"yes","no"} Constipation: {Blank  single:19197::"yes","no"} Diarrhea/loose stools: {Blank single:19197::"yes","no"} Palpitations: {Blank single:19197::"yes","no"} Lower extremity edema: {Blank single:19197::"yes","no"} Anxiety/depressed mood: {Blank single:19197::"yes","no"}   Relevant past medical, surgical, family and social history reviewed and updated as indicated. Interim medical history since our last visit reviewed. Allergies and medications reviewed and updated.  Review of Systems  Per HPI unless specifically indicated above     Objective:    There were no vitals taken for this visit.  Wt Readings from Last 3 Encounters:  09/23/20 170 lb 4 oz (77.2 kg)  09/14/20 171 lb (77.6 kg)  08/20/20 193 lb (87.5 kg)    Physical Exam  Results for orders placed or performed in visit on 09/23/20  Urine Culture   Specimen: Urine   UR  Result Value Ref Range   Urine Culture, Routine Final report    Organism ID, Bacteria Comment   Microscopic Examination   Urine  Result Value Ref Range   WBC, UA 0-5 0 - 5 /hpf   RBC 3-10 (A) 0 - 2 /hpf   Epithelial Cells (non renal) 0-10 0 - 10 /hpf   Casts Present (A) None seen /lpf   Cast Type Hyaline casts N/A   Mucus, UA Present (A) Not Estab.   Bacteria, UA None seen None seen/Few   Yeast, UA Present (A) None seen  CBC with Differential/Platelet  Result Value Ref Range   WBC 4.4 3.4 - 10.8 x10E3/uL   RBC 4.39 3.77 - 5.28 x10E6/uL   Hemoglobin 11.7 11.1 - 15.9 g/dL   Hematocrit 38.1 34.0 - 46.6 %   MCV 87 79 - 97 fL   MCH 26.7 26.6 - 33.0 pg  MCHC 30.7 (L) 31.5 - 35.7 g/dL   RDW 17.8 (H) 11.7 - 15.4 %   Platelets 145 (L) 150 - 450 x10E3/uL   Neutrophils 56 Not Estab. %   Lymphs 29 Not Estab. %   Monocytes 10 Not Estab. %   Eos 4 Not Estab. %   Basos 1 Not Estab. %   Neutrophils Absolute 2.5 1.4 - 7.0 x10E3/uL   Lymphocytes Absolute 1.3 0.7 - 3.1 x10E3/uL   Monocytes Absolute 0.5 0.1 - 0.9 x10E3/uL   EOS (ABSOLUTE) 0.2 0.0 - 0.4 x10E3/uL   Basophils Absolute  0.0 0.0 - 0.2 x10E3/uL   Immature Granulocytes 0 Not Estab. %   Immature Grans (Abs) 0.0 0.0 - 0.1 x10E3/uL  Comprehensive metabolic panel  Result Value Ref Range   Glucose 101 (H) 65 - 99 mg/dL   BUN 31 (H) 8 - 27 mg/dL   Creatinine, Ser 1.81 (H) 0.57 - 1.00 mg/dL   eGFR 30 (L) >59 mL/min/1.73   BUN/Creatinine Ratio 17 12 - 28   Sodium 140 134 - 144 mmol/L   Potassium 4.2 3.5 - 5.2 mmol/L   Chloride 91 (L) 96 - 106 mmol/L   CO2 35 (H) 20 - 29 mmol/L   Calcium 9.6 8.7 - 10.3 mg/dL   Total Protein 7.1 6.0 - 8.5 g/dL   Albumin 3.9 3.8 - 4.8 g/dL   Globulin, Total 3.2 1.5 - 4.5 g/dL   Albumin/Globulin Ratio 1.2 1.2 - 2.2   Bilirubin Total 0.9 0.0 - 1.2 mg/dL   Alkaline Phosphatase 63 44 - 121 IU/L   AST 17 0 - 40 IU/L   ALT 8 0 - 32 IU/L  Lipid panel  Result Value Ref Range   Cholesterol, Total 124 100 - 199 mg/dL   Triglycerides 113 0 - 149 mg/dL   HDL 51 >39 mg/dL   VLDL Cholesterol Cal 20 5 - 40 mg/dL   LDL Chol Calc (NIH) 53 0 - 99 mg/dL   Chol/HDL Ratio 2.4 0.0 - 4.4 ratio  TSH  Result Value Ref Range   TSH 214.000 (H) 0.450 - 4.500 uIU/mL  Urinalysis, Routine w reflex microscopic  Result Value Ref Range   Specific Gravity, UA 1.015 1.005 - 1.030   pH, UA 6.0 5.0 - 7.5   Color, UA Yellow Yellow   Appearance Ur Cloudy (A) Clear   Leukocytes,UA 1+ (A) Negative   Protein,UA 1+ (A) Negative/Trace   Glucose, UA Negative Negative   Ketones, UA Negative Negative   RBC, UA Trace (A) Negative   Bilirubin, UA Negative Negative   Urobilinogen, Ur 0.2 0.2 - 1.0 mg/dL   Nitrite, UA Negative Negative   Microscopic Examination See below:   HgB A1c  Result Value Ref Range   Hgb A1c MFr Bld 5.8 (H) 4.8 - 5.6 %   Est. average glucose Bld gHb Est-mCnc 120 mg/dL      Assessment & Plan:   Problem List Items Addressed This Visit       Cardiovascular and Mediastinum   Benign essential HTN (Chronic)   CAD (coronary artery disease) - Primary (Chronic)   Chronic systolic  congestive heart failure, NYHA class 3 (HCC)     Respiratory   COPD, severe (HCC)     Genitourinary   Chronic kidney disease, stage 3a (HCC)     Other   Hypercholesteremia     Follow up plan: No follow-ups on file.

## 2020-10-07 ENCOUNTER — Other Ambulatory Visit: Payer: Self-pay

## 2020-10-07 ENCOUNTER — Other Ambulatory Visit: Payer: Medicare Other

## 2020-10-07 ENCOUNTER — Ambulatory Visit: Payer: Medicare Other | Admitting: Nurse Practitioner

## 2020-10-07 ENCOUNTER — Telehealth: Payer: Self-pay | Admitting: Nurse Practitioner

## 2020-10-07 DIAGNOSIS — I420 Dilated cardiomyopathy: Secondary | ICD-10-CM | POA: Diagnosis not present

## 2020-10-07 DIAGNOSIS — N1831 Chronic kidney disease, stage 3a: Secondary | ICD-10-CM

## 2020-10-07 DIAGNOSIS — Z7901 Long term (current) use of anticoagulants: Secondary | ICD-10-CM | POA: Diagnosis not present

## 2020-10-07 DIAGNOSIS — Z8616 Personal history of COVID-19: Secondary | ICD-10-CM | POA: Diagnosis not present

## 2020-10-07 DIAGNOSIS — I10 Essential (primary) hypertension: Secondary | ICD-10-CM

## 2020-10-07 DIAGNOSIS — I4891 Unspecified atrial fibrillation: Secondary | ICD-10-CM | POA: Diagnosis not present

## 2020-10-07 DIAGNOSIS — I5023 Acute on chronic systolic (congestive) heart failure: Secondary | ICD-10-CM | POA: Diagnosis not present

## 2020-10-07 DIAGNOSIS — G4700 Insomnia, unspecified: Secondary | ICD-10-CM | POA: Diagnosis not present

## 2020-10-07 DIAGNOSIS — I5022 Chronic systolic (congestive) heart failure: Secondary | ICD-10-CM

## 2020-10-07 DIAGNOSIS — N200 Calculus of kidney: Secondary | ICD-10-CM | POA: Diagnosis not present

## 2020-10-07 DIAGNOSIS — F32A Depression, unspecified: Secondary | ICD-10-CM | POA: Diagnosis not present

## 2020-10-07 DIAGNOSIS — J9620 Acute and chronic respiratory failure, unspecified whether with hypoxia or hypercapnia: Secondary | ICD-10-CM | POA: Diagnosis not present

## 2020-10-07 DIAGNOSIS — I447 Left bundle-branch block, unspecified: Secondary | ICD-10-CM | POA: Diagnosis not present

## 2020-10-07 DIAGNOSIS — Z9981 Dependence on supplemental oxygen: Secondary | ICD-10-CM | POA: Diagnosis not present

## 2020-10-07 DIAGNOSIS — I081 Rheumatic disorders of both mitral and tricuspid valves: Secondary | ICD-10-CM | POA: Diagnosis not present

## 2020-10-07 DIAGNOSIS — Z9181 History of falling: Secondary | ICD-10-CM | POA: Diagnosis not present

## 2020-10-07 DIAGNOSIS — E7800 Pure hypercholesterolemia, unspecified: Secondary | ICD-10-CM

## 2020-10-07 DIAGNOSIS — I25118 Atherosclerotic heart disease of native coronary artery with other forms of angina pectoris: Secondary | ICD-10-CM

## 2020-10-07 DIAGNOSIS — Z7951 Long term (current) use of inhaled steroids: Secondary | ICD-10-CM | POA: Diagnosis not present

## 2020-10-07 DIAGNOSIS — J449 Chronic obstructive pulmonary disease, unspecified: Secondary | ICD-10-CM

## 2020-10-07 DIAGNOSIS — Z87891 Personal history of nicotine dependence: Secondary | ICD-10-CM | POA: Diagnosis not present

## 2020-10-07 DIAGNOSIS — I130 Hypertensive heart and chronic kidney disease with heart failure and stage 1 through stage 4 chronic kidney disease, or unspecified chronic kidney disease: Secondary | ICD-10-CM | POA: Diagnosis not present

## 2020-10-07 DIAGNOSIS — E039 Hypothyroidism, unspecified: Secondary | ICD-10-CM | POA: Diagnosis not present

## 2020-10-07 DIAGNOSIS — G2581 Restless legs syndrome: Secondary | ICD-10-CM | POA: Diagnosis not present

## 2020-10-07 DIAGNOSIS — I2510 Atherosclerotic heart disease of native coronary artery without angina pectoris: Secondary | ICD-10-CM | POA: Diagnosis not present

## 2020-10-07 NOTE — Telephone Encounter (Signed)
Lab orders placed.  

## 2020-10-08 ENCOUNTER — Ambulatory Visit: Payer: Self-pay

## 2020-10-08 DIAGNOSIS — I2510 Atherosclerotic heart disease of native coronary artery without angina pectoris: Secondary | ICD-10-CM | POA: Diagnosis not present

## 2020-10-08 DIAGNOSIS — I5023 Acute on chronic systolic (congestive) heart failure: Secondary | ICD-10-CM | POA: Diagnosis not present

## 2020-10-08 DIAGNOSIS — I4891 Unspecified atrial fibrillation: Secondary | ICD-10-CM | POA: Diagnosis not present

## 2020-10-08 DIAGNOSIS — I420 Dilated cardiomyopathy: Secondary | ICD-10-CM | POA: Diagnosis not present

## 2020-10-08 DIAGNOSIS — N200 Calculus of kidney: Secondary | ICD-10-CM | POA: Diagnosis not present

## 2020-10-08 DIAGNOSIS — Z8616 Personal history of COVID-19: Secondary | ICD-10-CM | POA: Diagnosis not present

## 2020-10-08 DIAGNOSIS — Z9181 History of falling: Secondary | ICD-10-CM | POA: Diagnosis not present

## 2020-10-08 DIAGNOSIS — Z87891 Personal history of nicotine dependence: Secondary | ICD-10-CM | POA: Diagnosis not present

## 2020-10-08 DIAGNOSIS — I447 Left bundle-branch block, unspecified: Secondary | ICD-10-CM | POA: Diagnosis not present

## 2020-10-08 DIAGNOSIS — G4700 Insomnia, unspecified: Secondary | ICD-10-CM | POA: Diagnosis not present

## 2020-10-08 DIAGNOSIS — Z7951 Long term (current) use of inhaled steroids: Secondary | ICD-10-CM | POA: Diagnosis not present

## 2020-10-08 DIAGNOSIS — N1831 Chronic kidney disease, stage 3a: Secondary | ICD-10-CM | POA: Diagnosis not present

## 2020-10-08 DIAGNOSIS — F32A Depression, unspecified: Secondary | ICD-10-CM | POA: Diagnosis not present

## 2020-10-08 DIAGNOSIS — Z7901 Long term (current) use of anticoagulants: Secondary | ICD-10-CM | POA: Diagnosis not present

## 2020-10-08 DIAGNOSIS — J9620 Acute and chronic respiratory failure, unspecified whether with hypoxia or hypercapnia: Secondary | ICD-10-CM | POA: Diagnosis not present

## 2020-10-08 DIAGNOSIS — J449 Chronic obstructive pulmonary disease, unspecified: Secondary | ICD-10-CM | POA: Diagnosis not present

## 2020-10-08 DIAGNOSIS — G2581 Restless legs syndrome: Secondary | ICD-10-CM | POA: Diagnosis not present

## 2020-10-08 DIAGNOSIS — Z9981 Dependence on supplemental oxygen: Secondary | ICD-10-CM | POA: Diagnosis not present

## 2020-10-08 DIAGNOSIS — I130 Hypertensive heart and chronic kidney disease with heart failure and stage 1 through stage 4 chronic kidney disease, or unspecified chronic kidney disease: Secondary | ICD-10-CM | POA: Diagnosis not present

## 2020-10-08 DIAGNOSIS — I081 Rheumatic disorders of both mitral and tricuspid valves: Secondary | ICD-10-CM | POA: Diagnosis not present

## 2020-10-08 LAB — COMPREHENSIVE METABOLIC PANEL
ALT: 8 IU/L (ref 0–32)
AST: 17 IU/L (ref 0–40)
Albumin/Globulin Ratio: 1.3 (ref 1.2–2.2)
Albumin: 4 g/dL (ref 3.8–4.8)
Alkaline Phosphatase: 63 IU/L (ref 44–121)
BUN/Creatinine Ratio: 15 (ref 12–28)
BUN: 33 mg/dL — ABNORMAL HIGH (ref 8–27)
Bilirubin Total: 0.9 mg/dL (ref 0.0–1.2)
CO2: 33 mmol/L — ABNORMAL HIGH (ref 20–29)
Calcium: 9.5 mg/dL (ref 8.7–10.3)
Chloride: 89 mmol/L — ABNORMAL LOW (ref 96–106)
Creatinine, Ser: 2.2 mg/dL — ABNORMAL HIGH (ref 0.57–1.00)
Globulin, Total: 3.2 g/dL (ref 1.5–4.5)
Glucose: 98 mg/dL (ref 70–99)
Potassium: 4.5 mmol/L (ref 3.5–5.2)
Sodium: 139 mmol/L (ref 134–144)
Total Protein: 7.2 g/dL (ref 6.0–8.5)
eGFR: 24 mL/min/{1.73_m2} — ABNORMAL LOW (ref >59)

## 2020-10-08 LAB — CBC WITH DIFFERENTIAL/PLATELET
Basophils Absolute: 0 10*3/uL (ref 0.0–0.2)
Basos: 1 %
EOS (ABSOLUTE): 0.3 10*3/uL (ref 0.0–0.4)
Eos: 6 %
Hematocrit: 35.2 % (ref 34.0–46.6)
Hemoglobin: 10.8 g/dL — ABNORMAL LOW (ref 11.1–15.9)
Immature Grans (Abs): 0 10*3/uL (ref 0.0–0.1)
Immature Granulocytes: 1 %
Lymphocytes Absolute: 1.7 10*3/uL (ref 0.7–3.1)
Lymphs: 35 %
MCH: 27.5 pg (ref 26.6–33.0)
MCHC: 30.7 g/dL — ABNORMAL LOW (ref 31.5–35.7)
MCV: 90 fL (ref 79–97)
Monocytes Absolute: 0.5 10*3/uL (ref 0.1–0.9)
Monocytes: 10 %
Neutrophils Absolute: 2.3 10*3/uL (ref 1.4–7.0)
Neutrophils: 47 %
Platelets: 133 10*3/uL — ABNORMAL LOW (ref 150–450)
RBC: 3.93 x10E6/uL (ref 3.77–5.28)
RDW: 18.5 % — ABNORMAL HIGH (ref 11.7–15.4)
WBC: 4.8 10*3/uL (ref 3.4–10.8)

## 2020-10-08 LAB — THYROID PANEL WITH TSH
Free Thyroxine Index: 0.8 — ABNORMAL LOW (ref 1.2–4.9)
T3 Uptake Ratio: 25 % (ref 24–39)
T4, Total: 3.1 ug/dL — ABNORMAL LOW (ref 4.5–12.0)
TSH: 369 u[IU]/mL — ABNORMAL HIGH (ref 0.450–4.500)

## 2020-10-08 MED ORDER — LEVOTHYROXINE SODIUM 75 MCG PO TABS: 75.0000 ug | ORAL_TABLET | Freq: Every day | ORAL | 0 refills | Status: DC

## 2020-10-08 NOTE — Progress Notes (Signed)
Please let patient know that her thyroid labs are still significantly elevated.  We need to start Levothyroxine 71mcg once daily.  She should take this first thing in the morning with only water and wait at least 30 minutes prior to eating or drinking anything else.  I have also referred her to Endocrinology due to her very elevated numbers.  If she has not seen them before 6 weeks she should return to the clinic for repeat labs to see if her thyroid labs are improving.  Please let me know if she has any questions.

## 2020-10-08 NOTE — Telephone Encounter (Signed)
Pt. Did not understand lab results. Daughter given results and instructions, verbalizes understanding.   Answer Assessment - Initial Assessment Questions 1. REASON FOR CALL or QUESTION: "What is your reason for calling today?" or "How can I best help you?" or "What question do you have that I can help answer?"     Calling to review lab results that were given to pt. Today. 2. CALLER: Document the source of call. (e.g., laboratory, patient).     Daughter.  Protocols used: PCP Call - No Triage-A-AH

## 2020-10-08 NOTE — Telephone Encounter (Signed)
Message from Osseo Callas sent at 10/08/2020 11:48 AM EDT  Pt daughter called asking if she could talk to a nurse regarding her mothers recent lab results . She states mom did not understand the results.   CB#  [phone]

## 2020-10-08 NOTE — Addendum Note (Signed)
Addended by: Jon Billings on: 10/08/2020 10:55 AM   Modules accepted: Orders

## 2020-10-10 DIAGNOSIS — J9690 Respiratory failure, unspecified, unspecified whether with hypoxia or hypercapnia: Secondary | ICD-10-CM | POA: Diagnosis not present

## 2020-10-11 ENCOUNTER — Ambulatory Visit: Payer: Self-pay

## 2020-10-11 MED ORDER — CLOTRIMAZOLE-BETAMETHASONE 1-0.05 % EX CREA: 1.0000 "application " | TOPICAL_CREAM | Freq: Every day | CUTANEOUS | 0 refills | Status: DC

## 2020-10-11 NOTE — Telephone Encounter (Signed)
I changed the medication to Lotrisone.  Please let me know if this does not help her symptoms.

## 2020-10-11 NOTE — Telephone Encounter (Signed)
Spoke to patient daughter and notified her Jon Billings, NP has sent over a new prescription for Lotrisone. Also, advised patient daughter to give our office a call back if patient symptoms worsen or do not get any better. Patient daughter verbalized understanding.

## 2020-10-11 NOTE — Telephone Encounter (Signed)
Patient's daughter [name] called and she says the patient has rash to the bilateral groin area and was prescribed Nystatin her last OV 09/23/20. She says her mom has gone through one tube and is on the second and says the rash is still the same, not getting better, still itching, moderate symptoms. She says they saw spoke to Jon Billings, NP on Thursday 10/07/20 and was told to keep using the medication. She says her mom wants to know is there anything else to use, maybe something OTC. I advised OTC medication is not as good as the prescription, to keep using this as prescribed. I will send this note to Santiago Glad and someone will call back with her recommendation, daughter verbalized understanding.   Pt daughter stated the cream pt was prescribed is not working and requests call back from a nurse to discuss possible otc medication pt can possibly try. Cb# [phone]     Reason for Disposition  [1] Caller has NON-URGENT medicine question about med that PCP prescribed AND [2] triager unable to answer question  Answer Assessment - Initial Assessment Questions 1. NAME of MEDICATION: "What medicine are you calling about?"     Nystatin cream 2. QUESTION: "What is your question?" (e.g., double dose of medicine, side effect)     Is there anything else to use, something OTC, medication not helping 3. PRESCRIBING HCP: "Who prescribed it?" Reason: if prescribed by specialist, call should be referred to that group.     Jon Billings, NP 4. SYMPTOMS: "Do you have any symptoms?"     No 5. SEVERITY: If symptoms are present, ask "Are they mild, moderate or severe?"     Moderate 6. PREGNANCY:  "Is there any chance that you are pregnant?" "When was your last menstrual period?"     No  Protocols used: Medication Question Call-A-AH

## 2020-10-12 ENCOUNTER — Other Ambulatory Visit: Payer: Self-pay

## 2020-10-12 ENCOUNTER — Telehealth: Payer: Self-pay | Admitting: Nurse Practitioner

## 2020-10-12 DIAGNOSIS — Z9181 History of falling: Secondary | ICD-10-CM | POA: Diagnosis not present

## 2020-10-12 DIAGNOSIS — I130 Hypertensive heart and chronic kidney disease with heart failure and stage 1 through stage 4 chronic kidney disease, or unspecified chronic kidney disease: Secondary | ICD-10-CM | POA: Diagnosis not present

## 2020-10-12 DIAGNOSIS — I420 Dilated cardiomyopathy: Secondary | ICD-10-CM | POA: Diagnosis not present

## 2020-10-12 DIAGNOSIS — I5023 Acute on chronic systolic (congestive) heart failure: Secondary | ICD-10-CM | POA: Diagnosis not present

## 2020-10-12 DIAGNOSIS — J449 Chronic obstructive pulmonary disease, unspecified: Secondary | ICD-10-CM | POA: Diagnosis not present

## 2020-10-12 DIAGNOSIS — N200 Calculus of kidney: Secondary | ICD-10-CM | POA: Diagnosis not present

## 2020-10-12 DIAGNOSIS — F32A Depression, unspecified: Secondary | ICD-10-CM | POA: Diagnosis not present

## 2020-10-12 DIAGNOSIS — N1831 Chronic kidney disease, stage 3a: Secondary | ICD-10-CM | POA: Diagnosis not present

## 2020-10-12 DIAGNOSIS — Z7951 Long term (current) use of inhaled steroids: Secondary | ICD-10-CM | POA: Diagnosis not present

## 2020-10-12 DIAGNOSIS — Z87891 Personal history of nicotine dependence: Secondary | ICD-10-CM | POA: Diagnosis not present

## 2020-10-12 DIAGNOSIS — Z7901 Long term (current) use of anticoagulants: Secondary | ICD-10-CM | POA: Diagnosis not present

## 2020-10-12 DIAGNOSIS — I2510 Atherosclerotic heart disease of native coronary artery without angina pectoris: Secondary | ICD-10-CM | POA: Diagnosis not present

## 2020-10-12 DIAGNOSIS — G2581 Restless legs syndrome: Secondary | ICD-10-CM | POA: Diagnosis not present

## 2020-10-12 DIAGNOSIS — I4891 Unspecified atrial fibrillation: Secondary | ICD-10-CM | POA: Diagnosis not present

## 2020-10-12 DIAGNOSIS — G4700 Insomnia, unspecified: Secondary | ICD-10-CM | POA: Diagnosis not present

## 2020-10-12 DIAGNOSIS — J9620 Acute and chronic respiratory failure, unspecified whether with hypoxia or hypercapnia: Secondary | ICD-10-CM | POA: Diagnosis not present

## 2020-10-12 DIAGNOSIS — I081 Rheumatic disorders of both mitral and tricuspid valves: Secondary | ICD-10-CM | POA: Diagnosis not present

## 2020-10-12 DIAGNOSIS — Z8616 Personal history of COVID-19: Secondary | ICD-10-CM | POA: Diagnosis not present

## 2020-10-12 DIAGNOSIS — B379 Candidiasis, unspecified: Secondary | ICD-10-CM

## 2020-10-12 DIAGNOSIS — Z9981 Dependence on supplemental oxygen: Secondary | ICD-10-CM | POA: Diagnosis not present

## 2020-10-12 DIAGNOSIS — I447 Left bundle-branch block, unspecified: Secondary | ICD-10-CM | POA: Diagnosis not present

## 2020-10-12 MED ORDER — CLOTRIMAZOLE-BETAMETHASONE 1-0.05 % EX CREA: 1.0000 "application " | TOPICAL_CREAM | Freq: Every day | CUTANEOUS | 0 refills | Status: DC

## 2020-10-12 NOTE — Telephone Encounter (Signed)
Copied from [place] [phone]. Topic: General - Other >> Oct 12, 2020  8:21 AM Leward Quan A wrote: Reason for CRM: Patient daughter called in to inform provider that Rx sent to pharmacy on yesterday did not go through. Asking if this can be sent to the pharmacy again.

## 2020-10-12 NOTE — Telephone Encounter (Signed)
Spoke with patient daughter and notified her that the prescription was resent over to patient local pharmacy. Apologized to patient as there was an error in our system. Patient verbalized understanding and has no further questions.

## 2020-10-13 NOTE — Progress Notes (Signed)
BP 105/63   Pulse 73   Temp 98.2 F (36.8 C) (Oral)   Wt 168 lb 3.2 oz (76.3 kg)   SpO2 96%   BMI 30.27 kg/m    Subjective:    Patient ID: Maria Neal, female    DOB: November 28, 1950, 70 y.o.   MRN: [id number]  HPI: Maria Neal is a 70 y.o. female  Chief Complaint  Patient presents with   Rash   Patient presents to clinic for follow on her rash between her legs.  States that using the new cream that was sent was very helpful.  States it is almost gone.  HYPOTHYROIDISM Patient did start the Levothyroxine 61m.  States she has fatigue and decreased appetite. Has not made an appointment with Endocrinology.   STOMACH PAIN Patient's daughter states that patient hasn't been eating much due to a recurrent upper abdominal pain.  States she eats very small portions and not very often then complains of abdominal pain.   Relevant past medical, surgical, family and social history reviewed and updated as indicated. Interim medical history since our last visit reviewed. Allergies and medications reviewed and updated.  Review of Systems  Constitutional:  Positive for appetite change and fatigue.  Gastrointestinal:  Positive for abdominal pain.   Per HPI unless specifically indicated above     Objective:    BP 105/63   Pulse 73   Temp 98.2 F (36.8 C) (Oral)   Wt 168 lb 3.2 oz (76.3 kg)   SpO2 96%   BMI 30.27 kg/m   Wt Readings from Last 3 Encounters:  10/14/20 168 lb 3.2 oz (76.3 kg)  09/23/20 170 lb 4 oz (77.2 kg)  09/14/20 171 lb (77.6 kg)    Physical Exam Vitals and nursing note reviewed.  Constitutional:      General: She is not in acute distress.    Appearance: Normal appearance. She is normal weight. She is not ill-appearing, toxic-appearing or diaphoretic.  HENT:     Head: Normocephalic.     Right Ear: External ear normal.     Left Ear: External ear normal.     Nose: Nose normal.     Mouth/Throat:     Mouth: Mucous membranes are moist.     Pharynx: Oropharynx  is clear.  Eyes:     General:        Right eye: No discharge.        Left eye: No discharge.     Extraocular Movements: Extraocular movements intact.     Conjunctiva/sclera: Conjunctivae normal.     Pupils: Pupils are equal, round, and reactive to light.  Cardiovascular:     Rate and Rhythm: Normal rate and regular rhythm.     Heart sounds: No murmur heard. Pulmonary:     Effort: Pulmonary effort is normal. No respiratory distress.     Breath sounds: Normal breath sounds. No wheezing or rales.  Abdominal:     General: Abdomen is flat. Bowel sounds are normal. There is no distension.     Palpations: Abdomen is soft.     Tenderness: There is no abdominal tenderness. There is no guarding.  Musculoskeletal:     Cervical back: Normal range of motion and neck supple.  Skin:    General: Skin is warm and dry.     Capillary Refill: Capillary refill takes less than 2 seconds.  Neurological:     General: No focal deficit present.     Mental Status: She is alert and  oriented to person, place, and time. Mental status is at baseline.  Psychiatric:        Mood and Affect: Mood normal.        Behavior: Behavior normal.        Thought Content: Thought content normal.        Judgment: Judgment normal.    Results for orders placed or performed in visit on 10/07/20  Comp Met (CMET)  Result Value Ref Range   Glucose 98 70 - 99 mg/dL   BUN 33 (H) 8 - 27 mg/dL   Creatinine, Ser 2.20 (H) 0.57 - 1.00 mg/dL   eGFR 24 (L) >59 mL/min/1.73   BUN/Creatinine Ratio 15 12 - 28   Sodium 139 134 - 144 mmol/L   Potassium 4.5 3.5 - 5.2 mmol/L   Chloride 89 (L) 96 - 106 mmol/L   CO2 33 (H) 20 - 29 mmol/L   Calcium 9.5 8.7 - 10.3 mg/dL   Total Protein 7.2 6.0 - 8.5 g/dL   Albumin 4.0 3.8 - 4.8 g/dL   Globulin, Total 3.2 1.5 - 4.5 g/dL   Albumin/Globulin Ratio 1.3 1.2 - 2.2   Bilirubin Total 0.9 0.0 - 1.2 mg/dL   Alkaline Phosphatase 63 44 - 121 IU/L   AST 17 0 - 40 IU/L   ALT 8 0 - 32 IU/L  CBC w/Diff   Result Value Ref Range   WBC 4.8 3.4 - 10.8 x10E3/uL   RBC 3.93 3.77 - 5.28 x10E6/uL   Hemoglobin 10.8 (L) 11.1 - 15.9 g/dL   Hematocrit 35.2 34.0 - 46.6 %   MCV 90 79 - 97 fL   MCH 27.5 26.6 - 33.0 pg   MCHC 30.7 (L) 31.5 - 35.7 g/dL   RDW 18.5 (H) 11.7 - 15.4 %   Platelets 133 (L) 150 - 450 x10E3/uL   Neutrophils 47 Not Estab. %   Lymphs 35 Not Estab. %   Monocytes 10 Not Estab. %   Eos 6 Not Estab. %   Basos 1 Not Estab. %   Neutrophils Absolute 2.3 1.4 - 7.0 x10E3/uL   Lymphocytes Absolute 1.7 0.7 - 3.1 x10E3/uL   Monocytes Absolute 0.5 0.1 - 0.9 x10E3/uL   EOS (ABSOLUTE) 0.3 0.0 - 0.4 x10E3/uL   Basophils Absolute 0.0 0.0 - 0.2 x10E3/uL   Immature Granulocytes 1 Not Estab. %   Immature Grans (Abs) 0.0 0.0 - 0.1 x10E3/uL  Thyroid Panel With TSH  Result Value Ref Range   TSH 369.000 (H) 0.450 - 4.500 uIU/mL   T4, Total 3.1 (L) 4.5 - 12.0 ug/dL   T3 Uptake Ratio 25 24 - 39 %   Free Thyroxine Index 0.8 (L) 1.2 - 4.9      Assessment & Plan:   Problem List Items Addressed This Visit       Endocrine   Hypothyroidism    Chronic. Not controlled. Levothyroxine 56mg started last week. Referral placed for patient to see Endocrinology for ongoing evaluation of hypothyroidism.  Last TSH was over 300.        Relevant Orders   Ambulatory referral to Endocrinology     Other   Epigastric pain - Primary    Ongoing. patient has been taking the Omeprazole daily.  States it is not helping her pain.  Will refer to gastroenterology for further evaluation and treatment of GI pain.      Relevant Orders   Ambulatory referral to Gastroenterology   Other Visit Diagnoses     Need for  influenza vaccination       Relevant Orders   Flu Vaccine QUAD High Dose(Fluad) (Completed)        Follow up plan: Return if symptoms worsen or fail to improve.

## 2020-10-14 ENCOUNTER — Ambulatory Visit (INDEPENDENT_AMBULATORY_CARE_PROVIDER_SITE_OTHER): Payer: Medicare Other | Admitting: Nurse Practitioner

## 2020-10-14 ENCOUNTER — Encounter: Payer: Self-pay | Admitting: Nurse Practitioner

## 2020-10-14 ENCOUNTER — Other Ambulatory Visit: Payer: Self-pay | Admitting: Nurse Practitioner

## 2020-10-14 ENCOUNTER — Other Ambulatory Visit: Payer: Self-pay

## 2020-10-14 VITALS — BP 105/63 | HR 73 | Temp 98.2°F | Wt 168.2 lb

## 2020-10-14 DIAGNOSIS — E039 Hypothyroidism, unspecified: Secondary | ICD-10-CM | POA: Insufficient documentation

## 2020-10-14 DIAGNOSIS — R1013 Epigastric pain: Secondary | ICD-10-CM | POA: Diagnosis not present

## 2020-10-14 DIAGNOSIS — Z23 Encounter for immunization: Secondary | ICD-10-CM

## 2020-10-14 DIAGNOSIS — Z1231 Encounter for screening mammogram for malignant neoplasm of breast: Secondary | ICD-10-CM

## 2020-10-14 NOTE — Assessment & Plan Note (Signed)
Chronic. Not controlled. Levothyroxine 66mcg started last week. Referral placed for patient to see Endocrinology for ongoing evaluation of hypothyroidism.  Last TSH was over 300.

## 2020-10-14 NOTE — Assessment & Plan Note (Signed)
Ongoing. patient has been taking the Omeprazole daily.  States it is not helping her pain.  Will refer to gastroenterology for further evaluation and treatment of GI pain.

## 2020-10-25 ENCOUNTER — Telehealth: Payer: Self-pay | Admitting: Nurse Practitioner

## 2020-10-25 DIAGNOSIS — I5022 Chronic systolic (congestive) heart failure: Secondary | ICD-10-CM | POA: Diagnosis not present

## 2020-10-25 DIAGNOSIS — N179 Acute kidney failure, unspecified: Secondary | ICD-10-CM | POA: Diagnosis not present

## 2020-10-25 DIAGNOSIS — N1832 Chronic kidney disease, stage 3b: Secondary | ICD-10-CM | POA: Diagnosis not present

## 2020-10-25 DIAGNOSIS — N2581 Secondary hyperparathyroidism of renal origin: Secondary | ICD-10-CM | POA: Diagnosis not present

## 2020-10-25 NOTE — Telephone Encounter (Signed)
Home Health Verbal Orders - Caller/Agency: [facility] Bells from [facility] Number: [phone] Requesting OT/PT/Skilled Nursing/Social Work/Speech Therapy: PT  Frequency:   Extend PT  1w8

## 2020-10-26 NOTE — Telephone Encounter (Signed)
Verbal orders were provided to [facility] from [facility] per Jon Billings, NP. Lake Bells verbalized understanding.

## 2020-10-26 NOTE — Telephone Encounter (Signed)
Routing to provider for orders.  °

## 2020-10-26 NOTE — Telephone Encounter (Signed)
Please see below and advise.

## 2020-10-26 NOTE — Telephone Encounter (Signed)
Okay to give verbal order.

## 2020-10-27 DIAGNOSIS — Z9181 History of falling: Secondary | ICD-10-CM | POA: Diagnosis not present

## 2020-10-27 DIAGNOSIS — I420 Dilated cardiomyopathy: Secondary | ICD-10-CM | POA: Diagnosis not present

## 2020-10-27 DIAGNOSIS — F32A Depression, unspecified: Secondary | ICD-10-CM | POA: Diagnosis not present

## 2020-10-27 DIAGNOSIS — Z7901 Long term (current) use of anticoagulants: Secondary | ICD-10-CM | POA: Diagnosis not present

## 2020-10-27 DIAGNOSIS — Z791 Long term (current) use of non-steroidal anti-inflammatories (NSAID): Secondary | ICD-10-CM | POA: Diagnosis not present

## 2020-10-27 DIAGNOSIS — Z9981 Dependence on supplemental oxygen: Secondary | ICD-10-CM | POA: Diagnosis not present

## 2020-10-27 DIAGNOSIS — I081 Rheumatic disorders of both mitral and tricuspid valves: Secondary | ICD-10-CM | POA: Diagnosis not present

## 2020-10-27 DIAGNOSIS — N1831 Chronic kidney disease, stage 3a: Secondary | ICD-10-CM | POA: Diagnosis not present

## 2020-10-27 DIAGNOSIS — I872 Venous insufficiency (chronic) (peripheral): Secondary | ICD-10-CM | POA: Diagnosis not present

## 2020-10-27 DIAGNOSIS — Z7951 Long term (current) use of inhaled steroids: Secondary | ICD-10-CM | POA: Diagnosis not present

## 2020-10-27 DIAGNOSIS — I130 Hypertensive heart and chronic kidney disease with heart failure and stage 1 through stage 4 chronic kidney disease, or unspecified chronic kidney disease: Secondary | ICD-10-CM | POA: Diagnosis not present

## 2020-10-27 DIAGNOSIS — I5043 Acute on chronic combined systolic (congestive) and diastolic (congestive) heart failure: Secondary | ICD-10-CM | POA: Diagnosis not present

## 2020-10-27 DIAGNOSIS — G2581 Restless legs syndrome: Secondary | ICD-10-CM | POA: Diagnosis not present

## 2020-10-27 DIAGNOSIS — N200 Calculus of kidney: Secondary | ICD-10-CM | POA: Diagnosis not present

## 2020-10-27 DIAGNOSIS — G4700 Insomnia, unspecified: Secondary | ICD-10-CM | POA: Diagnosis not present

## 2020-10-27 DIAGNOSIS — D500 Iron deficiency anemia secondary to blood loss (chronic): Secondary | ICD-10-CM | POA: Diagnosis not present

## 2020-10-27 DIAGNOSIS — J9620 Acute and chronic respiratory failure, unspecified whether with hypoxia or hypercapnia: Secondary | ICD-10-CM | POA: Diagnosis not present

## 2020-10-27 DIAGNOSIS — I2510 Atherosclerotic heart disease of native coronary artery without angina pectoris: Secondary | ICD-10-CM | POA: Diagnosis not present

## 2020-10-27 DIAGNOSIS — I447 Left bundle-branch block, unspecified: Secondary | ICD-10-CM | POA: Diagnosis not present

## 2020-10-27 DIAGNOSIS — Z87891 Personal history of nicotine dependence: Secondary | ICD-10-CM | POA: Diagnosis not present

## 2020-10-27 DIAGNOSIS — E785 Hyperlipidemia, unspecified: Secondary | ICD-10-CM | POA: Diagnosis not present

## 2020-10-27 DIAGNOSIS — J449 Chronic obstructive pulmonary disease, unspecified: Secondary | ICD-10-CM | POA: Diagnosis not present

## 2020-10-27 DIAGNOSIS — I4729 Other ventricular tachycardia: Secondary | ICD-10-CM | POA: Diagnosis not present

## 2020-10-27 DIAGNOSIS — I4819 Other persistent atrial fibrillation: Secondary | ICD-10-CM | POA: Diagnosis not present

## 2020-10-28 ENCOUNTER — Telehealth: Payer: Self-pay | Admitting: Nurse Practitioner

## 2020-10-28 DIAGNOSIS — E039 Hypothyroidism, unspecified: Secondary | ICD-10-CM

## 2020-10-28 NOTE — Telephone Encounter (Signed)
Can you find out if patient has an appointment with Endocrinology?  If so, we can decrease her levothyroxine dose to 15mcg.

## 2020-10-28 NOTE — Telephone Encounter (Signed)
Left message for patient daughter regarding Jon Billings, NP previous message. Advised patient daughter [name] to give our office a call back.

## 2020-10-28 NOTE — Telephone Encounter (Signed)
Copied from [place] [phone]. Topic: General - Other >> Oct 28, 2020 10:33 AM Tessa Lerner A wrote: Reason for CRM: The patient's daughter has called to request for the patient to be prescribed a lower dose of levothyroxine (SYNTHROID) 75 MCG tablet [[id number]]   The patient's daughter shares that the patient's hands have began to shake and they are dropping items regularly, symptoms began one day after beginning the medication around 10/09/20-10/10/20  The patient and their daughter believe it is a reaction to the strength of the medication   The patient last took the medication this morning 10/28/20 / Please contact further

## 2020-10-28 NOTE — Telephone Encounter (Signed)
Spoke with patient daughter [name] and was informed that patient isn't scheduled with Endocrinology until late March. Please advise?

## 2020-10-29 ENCOUNTER — Telehealth: Payer: Self-pay | Admitting: Nurse Practitioner

## 2020-10-29 NOTE — Telephone Encounter (Signed)
Is there anyway we can get her into Endo sooner.  Her TSH is over 300.

## 2020-10-29 NOTE — Telephone Encounter (Signed)
I can change it to urgent she really needs to be seen sooner than March.

## 2020-10-29 NOTE — Telephone Encounter (Signed)
Copied from [place] [phone]. Topic: Medicare AWV >> Oct 29, 2020  2:27 PM Lavonia Drafts wrote: Reason for CRM: Left message for patient to call back and schedule the Medicare Annual Wellness Visit (AWV) virtually or by telephone.  Last AWV 04/30/19  Please schedule at anytime with [facility] Health Advisor.  45 minute appointment  Any questions, please call me at [phone]

## 2020-11-02 NOTE — Telephone Encounter (Signed)
Maria Peat do we have an update on this referral before I call the patient back?

## 2020-11-02 NOTE — Telephone Encounter (Signed)
See notes below regarding decreasing patient's medication and advise as needed.  (Medication not specified).

## 2020-11-02 NOTE — Telephone Encounter (Signed)
Pts daughter, [name], calling and is requesting to have an update on decreasing pts medication. Please advise.

## 2020-11-04 ENCOUNTER — Encounter: Payer: Self-pay | Admitting: Emergency Medicine

## 2020-11-04 ENCOUNTER — Emergency Department: Payer: Medicare Other

## 2020-11-04 ENCOUNTER — Ambulatory Visit (INDEPENDENT_AMBULATORY_CARE_PROVIDER_SITE_OTHER): Payer: Medicare Other | Admitting: Nurse Practitioner

## 2020-11-04 ENCOUNTER — Encounter: Payer: Self-pay | Admitting: Nurse Practitioner

## 2020-11-04 ENCOUNTER — Inpatient Hospital Stay
Admission: EM | Admit: 2020-11-04 | Discharge: 2020-11-07 | DRG: 193 | Disposition: A | Discharge status: Home Health | Payer: Medicare Other | Source: Ambulatory Visit | Attending: Internal Medicine | Admitting: Internal Medicine

## 2020-11-04 ENCOUNTER — Other Ambulatory Visit: Payer: Self-pay

## 2020-11-04 VITALS — BP 105/55 | HR 71 | Temp 97.4°F

## 2020-11-04 DIAGNOSIS — I429 Cardiomyopathy, unspecified: Secondary | ICD-10-CM | POA: Diagnosis present

## 2020-11-04 DIAGNOSIS — Z888 Allergy status to other drugs, medicaments and biological substances status: Secondary | ICD-10-CM

## 2020-11-04 DIAGNOSIS — J440 Chronic obstructive pulmonary disease with acute lower respiratory infection: Secondary | ICD-10-CM | POA: Diagnosis not present

## 2020-11-04 DIAGNOSIS — J101 Influenza due to other identified influenza virus with other respiratory manifestations: Secondary | ICD-10-CM

## 2020-11-04 DIAGNOSIS — N1831 Chronic kidney disease, stage 3a: Secondary | ICD-10-CM | POA: Diagnosis present

## 2020-11-04 DIAGNOSIS — Z881 Allergy status to other antibiotic agents status: Secondary | ICD-10-CM | POA: Diagnosis not present

## 2020-11-04 DIAGNOSIS — I872 Venous insufficiency (chronic) (peripheral): Secondary | ICD-10-CM | POA: Diagnosis not present

## 2020-11-04 DIAGNOSIS — I10 Essential (primary) hypertension: Secondary | ICD-10-CM | POA: Diagnosis not present

## 2020-11-04 DIAGNOSIS — I2510 Atherosclerotic heart disease of native coronary artery without angina pectoris: Secondary | ICD-10-CM | POA: Diagnosis present

## 2020-11-04 DIAGNOSIS — Z79899 Other long term (current) drug therapy: Secondary | ICD-10-CM | POA: Diagnosis not present

## 2020-11-04 DIAGNOSIS — N1832 Chronic kidney disease, stage 3b: Secondary | ICD-10-CM | POA: Diagnosis not present

## 2020-11-04 DIAGNOSIS — J9621 Acute and chronic respiratory failure with hypoxia: Secondary | ICD-10-CM

## 2020-11-04 DIAGNOSIS — R0602 Shortness of breath: Secondary | ICD-10-CM

## 2020-11-04 DIAGNOSIS — Z95810 Presence of automatic (implantable) cardiac defibrillator: Secondary | ICD-10-CM

## 2020-11-04 DIAGNOSIS — Z7901 Long term (current) use of anticoagulants: Secondary | ICD-10-CM

## 2020-11-04 DIAGNOSIS — Z79890 Hormone replacement therapy (postmenopausal): Secondary | ICD-10-CM | POA: Diagnosis not present

## 2020-11-04 DIAGNOSIS — I482 Chronic atrial fibrillation, unspecified: Secondary | ICD-10-CM

## 2020-11-04 DIAGNOSIS — I130 Hypertensive heart and chronic kidney disease with heart failure and stage 1 through stage 4 chronic kidney disease, or unspecified chronic kidney disease: Secondary | ICD-10-CM | POA: Diagnosis present

## 2020-11-04 DIAGNOSIS — J441 Chronic obstructive pulmonary disease with (acute) exacerbation: Secondary | ICD-10-CM | POA: Diagnosis not present

## 2020-11-04 DIAGNOSIS — Z7951 Long term (current) use of inhaled steroids: Secondary | ICD-10-CM

## 2020-11-04 DIAGNOSIS — R5381 Other malaise: Secondary | ICD-10-CM | POA: Diagnosis present

## 2020-11-04 DIAGNOSIS — Z20822 Contact with and (suspected) exposure to covid-19: Secondary | ICD-10-CM | POA: Diagnosis not present

## 2020-11-04 DIAGNOSIS — E039 Hypothyroidism, unspecified: Secondary | ICD-10-CM | POA: Diagnosis not present

## 2020-11-04 DIAGNOSIS — J9611 Chronic respiratory failure with hypoxia: Secondary | ICD-10-CM | POA: Insufficient documentation

## 2020-11-04 DIAGNOSIS — I5023 Acute on chronic systolic (congestive) heart failure: Secondary | ICD-10-CM | POA: Diagnosis not present

## 2020-11-04 DIAGNOSIS — Z87891 Personal history of nicotine dependence: Secondary | ICD-10-CM

## 2020-11-04 DIAGNOSIS — Z8249 Family history of ischemic heart disease and other diseases of the circulatory system: Secondary | ICD-10-CM

## 2020-11-04 DIAGNOSIS — Z880 Allergy status to penicillin: Secondary | ICD-10-CM | POA: Diagnosis not present

## 2020-11-04 DIAGNOSIS — I509 Heart failure, unspecified: Secondary | ICD-10-CM | POA: Diagnosis not present

## 2020-11-04 DIAGNOSIS — Z9981 Dependence on supplemental oxygen: Secondary | ICD-10-CM | POA: Diagnosis not present

## 2020-11-04 DIAGNOSIS — I517 Cardiomegaly: Secondary | ICD-10-CM | POA: Diagnosis not present

## 2020-11-04 DIAGNOSIS — J9601 Acute respiratory failure with hypoxia: Secondary | ICD-10-CM | POA: Diagnosis not present

## 2020-11-04 DIAGNOSIS — R0902 Hypoxemia: Secondary | ICD-10-CM | POA: Diagnosis not present

## 2020-11-04 DIAGNOSIS — J449 Chronic obstructive pulmonary disease, unspecified: Secondary | ICD-10-CM | POA: Diagnosis present

## 2020-11-04 DIAGNOSIS — I110 Hypertensive heart disease with heart failure: Secondary | ICD-10-CM | POA: Diagnosis not present

## 2020-11-04 DIAGNOSIS — J810 Acute pulmonary edema: Secondary | ICD-10-CM

## 2020-11-04 HISTORY — DX: Influenza due to other identified influenza virus with other respiratory manifestations: J10.1

## 2020-11-04 HISTORY — DX: Generalized anxiety disorder: F41.1

## 2020-11-04 LAB — BASIC METABOLIC PANEL
Anion gap: 11 (ref 5–15)
BUN: 65 mg/dL — ABNORMAL HIGH (ref 8–23)
CO2: 34 mmol/L — ABNORMAL HIGH (ref 22–32)
Calcium: 8.9 mg/dL (ref 8.9–10.3)
Chloride: 89 mmol/L — ABNORMAL LOW (ref 98–111)
Creatinine, Ser: 2.46 mg/dL — ABNORMAL HIGH (ref 0.44–1.00)
GFR, Estimated: 21 mL/min — ABNORMAL LOW (ref >60)
Glucose, Bld: 101 mg/dL — ABNORMAL HIGH (ref 70–99)
Potassium: 4.8 mmol/L (ref 3.5–5.1)
Sodium: 134 mmol/L — ABNORMAL LOW (ref 135–145)

## 2020-11-04 LAB — TSH: TSH: 117.618 u[IU]/mL — ABNORMAL HIGH (ref 0.350–4.500)

## 2020-11-04 LAB — CBC
HCT: 33 % — ABNORMAL LOW (ref 36.0–46.0)
Hemoglobin: 10.1 g/dL — ABNORMAL LOW (ref 12.0–15.0)
MCH: 28.7 pg (ref 26.0–34.0)
MCHC: 30.6 g/dL (ref 30.0–36.0)
MCV: 93.8 fL (ref 80.0–100.0)
Platelets: 128 10*3/uL — ABNORMAL LOW (ref 150–400)
RBC: 3.52 MIL/uL — ABNORMAL LOW (ref 3.87–5.11)
RDW: 20.4 % — ABNORMAL HIGH (ref 11.5–15.5)
WBC: 4.6 10*3/uL (ref 4.0–10.5)
nRBC: 0 % (ref 0.0–0.2)

## 2020-11-04 LAB — BLOOD GAS, ARTERIAL
Acid-Base Excess: 13 mmol/L — ABNORMAL HIGH (ref 0.0–2.0)
Bicarbonate: 38.9 mmol/L — ABNORMAL HIGH (ref 20.0–28.0)
FIO2: 0.36
O2 Saturation: 96.7 %
Patient temperature: 37
pCO2 arterial: 56 mmHg — ABNORMAL HIGH (ref 32.0–48.0)
pH, Arterial: 7.45 (ref 7.350–7.450)
pO2, Arterial: 84 mmHg (ref 83.0–108.0)

## 2020-11-04 LAB — T4, FREE: Free T4: 0.86 ng/dL (ref 0.61–1.12)

## 2020-11-04 LAB — RESP PANEL BY RT-PCR (FLU A&B, COVID) ARPGX2
Influenza A by PCR: POSITIVE — AB
Influenza B by PCR: NEGATIVE
SARS Coronavirus 2 by RT PCR: NEGATIVE

## 2020-11-04 LAB — TROPONIN I (HIGH SENSITIVITY)
Troponin I (High Sensitivity): 54 ng/L — ABNORMAL HIGH (ref <18)
Troponin I (High Sensitivity): 54 ng/L — ABNORMAL HIGH (ref <18)

## 2020-11-04 LAB — BRAIN NATRIURETIC PEPTIDE: B Natriuretic Peptide: 2657.1 pg/mL — ABNORMAL HIGH (ref 0.0–100.0)

## 2020-11-04 LAB — DIGOXIN LEVEL: Digoxin Level: 2.3 ng/mL — ABNORMAL HIGH (ref 0.8–2.0)

## 2020-11-04 MED ORDER — ACETAZOLAMIDE 250 MG PO TABS
500.0000 mg | ORAL_TABLET | Freq: Two times a day (BID) | ORAL | Status: AC
  Administered 2020-11-04 – 2020-11-07 (×6): 500 mg via ORAL
  Filled 2020-11-04 (×6): qty 2

## 2020-11-04 MED ORDER — CARVEDILOL 6.25 MG PO TABS
12.5000 mg | ORAL_TABLET | Freq: Two times a day (BID) | ORAL | Status: DC
  Administered 2020-11-05 – 2020-11-06 (×2): 12.5 mg via ORAL
  Filled 2020-11-04 (×2): qty 2

## 2020-11-04 MED ORDER — GUAIFENESIN ER 600 MG PO TB12
600.0000 mg | ORAL_TABLET | Freq: Two times a day (BID) | ORAL | Status: DC
  Administered 2020-11-04 – 2020-11-07 (×6): 600 mg via ORAL
  Filled 2020-11-04 (×6): qty 1

## 2020-11-04 MED ORDER — APIXABAN 5 MG PO TABS
5.0000 mg | ORAL_TABLET | Freq: Two times a day (BID) | ORAL | Status: DC
  Administered 2020-11-04 – 2020-11-07 (×6): 5 mg via ORAL
  Filled 2020-11-04 (×6): qty 1

## 2020-11-04 MED ORDER — IPRATROPIUM-ALBUTEROL 0.5-2.5 (3) MG/3ML IN SOLN
3.0000 mL | Freq: Four times a day (QID) | RESPIRATORY_TRACT | Status: DC
Start: 2020-11-04 — End: 2020-11-07
  Administered 2020-11-04 – 2020-11-07 (×9): 3 mL via RESPIRATORY_TRACT
  Filled 2020-11-04 (×8): qty 3

## 2020-11-04 MED ORDER — ACETAMINOPHEN 650 MG RE SUPP: 650.0000 mg | Freq: Four times a day (QID) | RECTAL | Status: DC | PRN

## 2020-11-04 MED ORDER — FUROSEMIDE 10 MG/ML IJ SOLN
40.0000 mg | Freq: Two times a day (BID) | INTRAMUSCULAR | Status: DC
  Administered 2020-11-05 – 2020-11-06 (×2): 40 mg via INTRAVENOUS
  Filled 2020-11-04 (×2): qty 4

## 2020-11-04 MED ORDER — SPIRONOLACTONE 25 MG PO TABS
12.5000 mg | ORAL_TABLET | Freq: Every day | ORAL | Status: DC
  Administered 2020-11-05 – 2020-11-06 (×2): 12.5 mg via ORAL
  Filled 2020-11-04: qty 0.5
  Filled 2020-11-04 (×2): qty 1
  Filled 2020-11-04: qty 0.5

## 2020-11-04 MED ORDER — DIGOXIN 125 MCG PO TABS
0.0625 mg | ORAL_TABLET | Freq: Every day | ORAL | Status: DC
  Administered 2020-11-05 – 2020-11-07 (×3): 0.0625 mg via ORAL
  Filled 2020-11-04 (×3): qty 0.5

## 2020-11-04 MED ORDER — DIGOXIN 125 MCG PO TABS
0.0625 mg | ORAL_TABLET | Freq: Every day | ORAL | Status: DC
  Filled 2020-11-04: qty 0.5

## 2020-11-04 MED ORDER — PANTOPRAZOLE SODIUM 40 MG PO TBEC
40.0000 mg | DELAYED_RELEASE_TABLET | Freq: Every day | ORAL | Status: DC
  Administered 2020-11-05 – 2020-11-07 (×3): 40 mg via ORAL
  Filled 2020-11-04 (×3): qty 1

## 2020-11-04 MED ORDER — IPRATROPIUM-ALBUTEROL 0.5-2.5 (3) MG/3ML IN SOLN
3.0000 mL | Freq: Once | RESPIRATORY_TRACT | Status: AC
  Administered 2020-11-04: 3 mL via RESPIRATORY_TRACT
  Filled 2020-11-04: qty 3

## 2020-11-04 MED ORDER — FUROSEMIDE 10 MG/ML IJ SOLN
60.0000 mg | Freq: Once | INTRAMUSCULAR | Status: AC
  Administered 2020-11-04: 60 mg via INTRAVENOUS
  Filled 2020-11-04: qty 8

## 2020-11-04 MED ORDER — GABAPENTIN 100 MG PO CAPS
100.0000 mg | ORAL_CAPSULE | Freq: Every day | ORAL | Status: DC
  Filled 2020-11-04 (×3): qty 1

## 2020-11-04 MED ORDER — ONDANSETRON HCL 4 MG PO TABS: 4.0000 mg | ORAL_TABLET | Freq: Four times a day (QID) | ORAL | Status: DC | PRN

## 2020-11-04 MED ORDER — AMIODARONE HCL 200 MG PO TABS
200.0000 mg | ORAL_TABLET | Freq: Every day | ORAL | Status: DC
  Administered 2020-11-05 – 2020-11-07 (×3): 200 mg via ORAL
  Filled 2020-11-04 (×3): qty 1

## 2020-11-04 MED ORDER — LEVOTHYROXINE SODIUM 50 MCG PO TABS
75.0000 ug | ORAL_TABLET | Freq: Every day | ORAL | Status: DC
  Administered 2020-11-05 – 2020-11-06 (×2): 75 ug via ORAL
  Filled 2020-11-04 (×2): qty 1

## 2020-11-04 MED ORDER — ACETAMINOPHEN 325 MG PO TABS: 650.0000 mg | ORAL_TABLET | Freq: Four times a day (QID) | ORAL | Status: DC | PRN

## 2020-11-04 MED ORDER — OSELTAMIVIR PHOSPHATE 30 MG PO CAPS
30.0000 mg | ORAL_CAPSULE | Freq: Every day | ORAL | Status: DC
  Administered 2020-11-05 – 2020-11-07 (×3): 30 mg via ORAL
  Filled 2020-11-04 (×3): qty 1

## 2020-11-04 MED ORDER — ONDANSETRON HCL 4 MG/2ML IJ SOLN: 4.0000 mg | Freq: Four times a day (QID) | INTRAMUSCULAR | Status: DC | PRN

## 2020-11-04 MED ORDER — OSELTAMIVIR PHOSPHATE 30 MG PO CAPS
30.0000 mg | ORAL_CAPSULE | Freq: Once | ORAL | Status: AC
  Administered 2020-11-04: 30 mg via ORAL
  Filled 2020-11-04 (×2): qty 1

## 2020-11-04 NOTE — Progress Notes (Signed)
BP (!) 105/55   Pulse 71   Temp (!) 97.4 F (36.3 C) (Oral)   SpO2 (!) 76%    Subjective:    Patient ID: Maria Neal, female    DOB: 03-28-1950, 70 y.o.   MRN: [id number]  HPI: Maria Neal is a 70 y.o. female  Chief Complaint  Patient presents with   URI   Patient presents to clinic with shortness of breath some congestion, and increased work of breathing.  Patient states her O2 sats have been in the 70s-low 80s at home.  She is fatigued and does not have a lot of energy.  Denies fever or headaches.    Relevant past medical, surgical, family and social history reviewed and updated as indicated. Interim medical history since our last visit reviewed. Allergies and medications reviewed and updated.  Review of Systems  Constitutional:  Positive for fatigue. Negative for fever.  HENT:  Positive for congestion.   Respiratory:  Positive for shortness of breath.   Neurological:  Negative for headaches.   Per HPI unless specifically indicated above     Objective:    BP (!) 105/55   Pulse 71   Temp (!) 97.4 F (36.3 C) (Oral)   SpO2 (!) 76%   Wt Readings from Last 3 Encounters:  10/14/20 168 lb 3.2 oz (76.3 kg)  09/23/20 170 lb 4 oz (77.2 kg)  09/14/20 171 lb (77.6 kg)    Physical Exam Vitals and nursing note reviewed.  Constitutional:      General: She is not in acute distress.    Appearance: Normal appearance. She is normal weight. She is not ill-appearing, toxic-appearing or diaphoretic.  HENT:     Head: Normocephalic.     Right Ear: External ear normal.     Left Ear: External ear normal.     Nose: Nose normal.     Mouth/Throat:     Mouth: Mucous membranes are moist.     Pharynx: Oropharynx is clear.  Eyes:     General:        Right eye: No discharge.        Left eye: No discharge.     Extraocular Movements: Extraocular movements intact.     Conjunctiva/sclera: Conjunctivae normal.     Pupils: Pupils are equal, round, and reactive to light.   Cardiovascular:     Rate and Rhythm: Normal rate and regular rhythm.     Heart sounds: Murmur heard.     Comments: Lips and fingers are cyanotic Pulmonary:     Effort: Tachypnea and accessory muscle usage present. No respiratory distress.     Breath sounds: Decreased air movement present. No wheezing or rales.  Musculoskeletal:     Cervical back: Normal range of motion and neck supple.  Skin:    General: Skin is warm and dry.     Capillary Refill: Capillary refill takes less than 2 seconds.  Neurological:     General: No focal deficit present.     Mental Status: She is alert and oriented to person, place, and time. Mental status is at baseline.  Psychiatric:        Mood and Affect: Mood normal.        Behavior: Behavior normal.        Thought Content: Thought content normal.        Judgment: Judgment normal.    Results for orders placed or performed in visit on 10/07/20  Comp Met (CMET)  Result Value Ref  Range   Glucose 98 70 - 99 mg/dL   BUN 33 (H) 8 - 27 mg/dL   Creatinine, Ser 2.20 (H) 0.57 - 1.00 mg/dL   eGFR 24 (L) >59 mL/min/1.73   BUN/Creatinine Ratio 15 12 - 28   Sodium 139 134 - 144 mmol/L   Potassium 4.5 3.5 - 5.2 mmol/L   Chloride 89 (L) 96 - 106 mmol/L   CO2 33 (H) 20 - 29 mmol/L   Calcium 9.5 8.7 - 10.3 mg/dL   Total Protein 7.2 6.0 - 8.5 g/dL   Albumin 4.0 3.8 - 4.8 g/dL   Globulin, Total 3.2 1.5 - 4.5 g/dL   Albumin/Globulin Ratio 1.3 1.2 - 2.2   Bilirubin Total 0.9 0.0 - 1.2 mg/dL   Alkaline Phosphatase 63 44 - 121 IU/L   AST 17 0 - 40 IU/L   ALT 8 0 - 32 IU/L  CBC w/Diff  Result Value Ref Range   WBC 4.8 3.4 - 10.8 x10E3/uL   RBC 3.93 3.77 - 5.28 x10E6/uL   Hemoglobin 10.8 (L) 11.1 - 15.9 g/dL   Hematocrit 35.2 34.0 - 46.6 %   MCV 90 79 - 97 fL   MCH 27.5 26.6 - 33.0 pg   MCHC 30.7 (L) 31.5 - 35.7 g/dL   RDW 18.5 (H) 11.7 - 15.4 %   Platelets 133 (L) 150 - 450 x10E3/uL   Neutrophils 47 Not Estab. %   Lymphs 35 Not Estab. %   Monocytes 10 Not  Estab. %   Eos 6 Not Estab. %   Basos 1 Not Estab. %   Neutrophils Absolute 2.3 1.4 - 7.0 x10E3/uL   Lymphocytes Absolute 1.7 0.7 - 3.1 x10E3/uL   Monocytes Absolute 0.5 0.1 - 0.9 x10E3/uL   EOS (ABSOLUTE) 0.3 0.0 - 0.4 x10E3/uL   Basophils Absolute 0.0 0.0 - 0.2 x10E3/uL   Immature Granulocytes 1 Not Estab. %   Immature Grans (Abs) 0.0 0.0 - 0.1 x10E3/uL  Thyroid Panel With TSH  Result Value Ref Range   TSH 369.000 (H) 0.450 - 4.500 uIU/mL   T4, Total 3.1 (L) 4.5 - 12.0 ug/dL   T3 Uptake Ratio 25 24 - 39 %   Free Thyroxine Index 0.8 (L) 1.2 - 4.9      Assessment & Plan:   Problem List Items Addressed This Visit       Respiratory   COPD exacerbation ([place]) - Primary    Due to increase work of breathing, O2 sats and cyanosis patient was referred to the ER for further evaluation and management. Patients daughter with her and agrees to plan of care.  Increase O2 in the office to 5L.        Follow up plan: Return if symptoms worsen or fail to improve.   A total of 30 minutes were spent on this encounter today.  When total time is documented, this includes both the face-to-face and non-face-to-face time personally spent before, during and after the visit on the date of the encounter discussing concerning symptoms and examining patient.

## 2020-11-04 NOTE — ED Provider Notes (Signed)
[facility] Emergency Department Provider Note   ____________________________________________   Event Date/Time   First MD Initiated Contact with Patient 11/04/20 1304     (approximate)  I have reviewed the triage vital signs and the nursing notes.   HISTORY  Chief Complaint Hypoxia    HPI Maria Neal is a 70 y.o. female who presents for shortness of breath  LOCATION: Chest DURATION: 24 hours prior to arrival TIMING: Worsening since onset SEVERITY: Severe QUALITY: Shortness of breath CONTEXT: Patient states that she normally wears 3 L of oxygen at baseline but oxygen level has been around 86% at home after feeling short of breath for the last 24 hours MODIFYING FACTORS: Any exertion or lack of supplemental oxygen worsens the shortness of breath and is partially relieved with submental oxygen ASSOCIATED SYMPTOMS: Nonproductive cough   Per medical record review, patient has history of CHF, COPD, and CAD          Past Medical History:  Diagnosis Date   Acute appendicitis 02/17/2018   AICD (automatic cardioverter/defibrillator) present    Anxiety state    CAD (coronary artery disease)    Cardiomyopathy ([place])    CHF (congestive heart failure) ([place])    COPD (chronic obstructive pulmonary disease) ([place])    Dyspnea    Headache    History of kidney stones    History of shingles    Hypertension    On home oxygen therapy    bedtime and prn   Restless leg syndrome     Patient Active Problem List   Diagnosis Date Noted   COPD exacerbation ([place]) 11/04/2020   Chronic respiratory failure with hypoxia ([place]) - 3 L/min 11/04/2020   Influenza A 11/04/2020   Acute on chronic systolic CHF (congestive heart failure) ([place]) 11/04/2020   Acute on chronic respiratory failure with hypoxia ([employer]) - home O2 @ 3 L/min 11/04/2020   Chronic a-fib ([place]) 11/04/2020   Hypothyroidism 10/14/2020   Current use of long term anticoagulation 33/29/5188   Acute  systolic heart failure ([place]) 08/07/2020   Medication management 06/25/2020   Chronic kidney disease, stage 3a ([place]) 09/04/2019   IFG (impaired fasting glucose) 04/01/2019   Leg pain 07/23/2018   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/23/2018   Chronic venous insufficiency 07/23/2018   Simple endometrial hyperplasia without atypia 07/04/2018   Epigastric pain 06/28/2018   Cervical radiculitis 05/17/2017   Hypercholesteremia 05/02/2017   Obesity (BMI 30-39.9) 05/02/2017   Mitral valve insufficiency 04/16/2017   Closed fracture of lateral malleolus 41/66/0630   Chronic systolic congestive heart failure, NYHA class 3 ([place]) 08/07/2016   NICM (nonischemic cardiomyopathy) ([place]) 08/07/2016   S/P ICD (internal cardiac defibrillator) procedure 12/02/2014   Bundle branch block, 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 Idelle Leech   CARDIAC CATHETERIZATION     CARDIAC DEFIBRILLATOR PLACEMENT     CARDIOVERSION N/A 08/11/2020   Procedure: CARDIOVERSION;  Surgeon: Dixie Dials, MD;  Location: [facility] ENDOSCOPY;  Service: Cardiovascular;  Laterality: N/A;   CHOLECYSTECTOMY N/A 08/23/2016   Procedure: LAPAROSCOPIC CHOLECYSTECTOMY;  Surgeon: Clayburn Pert, MD;  Location: [facility] ORS;  Service: General;  Laterality: N/A;   DILATATION & CURETTAGE/HYSTEROSCOPY WITH MYOSURE N/A 06/27/2018  Procedure: [place];  Surgeon: Malachy Mood, MD;  Location: [facility] ORS;  Service: Gynecology;  Laterality: N/A;   HERNIA REPAIR     LAPAROSCOPIC APPENDECTOMY N/A 02/17/2018   Procedure: APPENDECTOMY LAPAROSCOPIC;  Surgeon: Benjamine Sprague, DO;  Location: [facility] ORS;  Service: General;   Laterality: N/A;   PPM GENERATOR CHANGEOUT N/A 09/14/2020   Procedure: PPM GENERATOR CHANGEOUT;  Surgeon: Isaias Cowman, MD;  Location: [place] CV LAB;  Service: Cardiovascular;  Laterality: N/A;   TEE WITHOUT CARDIOVERSION N/A 08/10/2020   Procedure: TRANSESOPHAGEAL ECHOCARDIOGRAM (TEE);  Surgeon: Dixie Dials, MD;  Location[facility] ENDOSCOPY;  Service: Cardiovascular;  Laterality: N/A;    Prior to Admission medications   Medication Sig Start Date End Date Taking? Authorizing Provider  albuterol (PROVENTIL HFA;VENTOLIN HFA) 108 (90 Base) MCG/ACT inhaler Inhale 2 puffs into the lungs every 6 (six) hours as needed for wheezing or shortness of breath.   Yes [provider]  amiodarone (PACERONE) 200 MG tablet Take 1 tablet (200 mg total) by mouth daily. 08/13/20  Yes Dixie Dials, MD  apixaban (ELIQUIS) 5 MG TABS tablet Take 1 tablet (5 mg total) by mouth 2 (two) times daily. 08/13/20  Yes Dixie Dials, MD  carvedilol (COREG) 12.5 MG tablet Take 12.5 mg by mouth 2 (two) times daily. 08/16/20  Yes [provider]  digoxin 62.5 MCG TABS Take 0.0625 mg by mouth daily. 08/14/20  Yes Dixie Dials, MD  fluticasone (FLONASE) 50 MCG/ACT nasal spray Place 2 sprays into both nostrils daily. 07/26/18  Yes Volney American, PA-C  Fluticasone-Umeclidin-Vilant (TRELEGY ELLIPTA) 100-62.5-25 MCG/INH AEPB Inhale 1 puff into the lungs daily. 02/12/20  Yes [provider]  gabapentin (NEURONTIN) 100 MG capsule Take 1 capsule (100 mg total) by mouth at bedtime. 08/13/20  Yes Dixie Dials, MD  levothyroxine (SYNTHROID) 75 MCG tablet Take 1 tablet (75 mcg total) by mouth daily. 10/08/20  Yes Jon Billings, NP  omeprazole (PRILOSEC) 20 MG capsule TAKE 1 CAPSULE BY MOUTH ONCE DAILY 10/01/20  Yes Jon Billings, NP  spironolactone (ALDACTONE) 25 MG tablet Take 12.5 mg by mouth daily. 04/09/17  Yes [provider]  alum & mag hydroxide-simeth (MAALOX/MYLANTA) 200-200-20  MG/5ML suspension Take 30 mLs by mouth every 4 (four) hours as needed for indigestion or heartburn. 08/13/20   Dixie Dials, MD  clotrimazole-betamethasone (LOTRISONE) cream Apply 1 application topically daily. 10/12/20   Jon Billings, NP  cyclobenzaprine (FLEXERIL) 5 MG tablet TAKE 1 TABLET BY MOUTH 3 TIMES DAILY AS NEEDED Patient not taking: No sig reported 04/04/19   Lucilla Lame, MD  nystatin cream (MYCOSTATIN) Apply 1 application topically 2 (two) times daily. 09/23/20   Jon Billings, NP  OXYGEN Inhale 3 L into the lungs 3 (three) times daily as needed (shortness of breath or coughing).    [provider]  torsemide (DEMADEX) 20 MG tablet Take 1 tablet (20 mg total) by mouth once a week. Patient not taking: Reported on 11/04/2020 08/16/20   Dixie Dials, MD    Allergies Levaquin [levofloxacin]; Amoxicillin; Nitrofuran derivatives; Penicillins; Zithromax [azithromycin]; and Antihistamines, chlorpheniramine-type  Family History  Problem Relation Age of Onset   Diabetes Mellitus II Mother    Hypertension Mother    Heart attack Father    Heart disease Brother    Arthritis Sister    Depression Daughter    Depression Son    Schizophrenia Son    Arthritis Sister    Diabetes Brother     Social History Social History  Tobacco Use   Smoking status: Former    Packs/day: 0.25    Types: Cigarettes   Smokeless tobacco: Never   Tobacco comments:    quit at age 52, whole pack lasted 3 weeks   Vaping Use   Vaping Use: Never used  Substance Use Topics   Alcohol use: No    Alcohol/week: 0.0 standard drinks   Drug use: No    Review of Systems Constitutional: No fever/chills Eyes: No visual changes. ENT: No sore throat. Cardiovascular: Denies chest pain. Respiratory: Endorses shortness of breath.  And nonproductive cough Gastrointestinal: No abdominal pain.  No nausea, no vomiting.  No diarrhea. Genitourinary: Negative for dysuria. Musculoskeletal: Negative for  acute arthralgias Skin: Negative for rash. Neurological: Negative for headaches, weakness/numbness/paresthesias in any extremity Psychiatric: Negative for suicidal ideation/homicidal ideation   ____________________________________________   PHYSICAL EXAM:  VITAL SIGNS: ED Triage Vitals  Enc Vitals Group     BP 11/04/20 1100 (!) 114/52     Pulse Rate 11/04/20 0951 72     Resp 11/04/20 0951 20     Temp 11/04/20 0951 98.1 F (36.7 C)     Temp Source 11/04/20 0951 Oral     SpO2 11/04/20 0951 (!) 49 %     Weight 11/04/20 0952 168 lb 3.4 oz (76.3 kg)     Height 11/04/20 0952 5' 2.5" (1.588 m)     Head Circumference --      Peak Flow --      Pain Score 11/04/20 0952 0     Pain Loc --      Pain Edu? --      Excl. in [place]? --    Constitutional: Alert and oriented. Well appearing and in no acute distress. Eyes: Conjunctivae are normal. PERRL. Head: Atraumatic. Nose: No congestion/rhinnorhea. Mouth/Throat: Mucous membranes are moist. Neck: No stridor Cardiovascular: Grossly normal heart sounds.  Good peripheral circulation. Respiratory: Rales over bilateral lung fields.  Increased respiratory effort.  No retractions. Gastrointestinal: Soft and nontender. No distention. Musculoskeletal: No obvious deformities Neurologic:  Normal speech and language. No gross focal neurologic deficits are appreciated. Skin:  Skin is warm and dry. No rash noted. Psychiatric: Mood and affect are normal. Speech and behavior are normal.  ____________________________________________   LABS (all labs ordered are listed, but only abnormal results are displayed)  Labs Reviewed  RESP PANEL BY RT-PCR (FLU A&B, COVID) ARPGX2 - Abnormal; Notable for the following components:      Result Value   Influenza A by PCR POSITIVE (*)    All other components within normal limits  BASIC METABOLIC PANEL - Abnormal; Notable for the following components:   Sodium 134 (*)    Chloride 89 (*)    CO2 34 (*)    Glucose,  Bld 101 (*)    BUN 65 (*)    Creatinine, Ser 2.46 (*)    GFR, Estimated 21 (*)    All other components within normal limits  CBC - Abnormal; Notable for the following components:   RBC 3.52 (*)    Hemoglobin 10.1 (*)    HCT 33.0 (*)    RDW 20.4 (*)    Platelets 128 (*)    All other components within normal limits  BLOOD GAS, ARTERIAL - Abnormal; Notable for the following components:   pCO2 arterial 56 (*)    Bicarbonate 38.9 (*)    Acid-Base Excess 13.0 (*)    All other components within normal limits  BRAIN NATRIURETIC PEPTIDE - Abnormal; Notable  for the following components:   B Natriuretic Peptide 2,657.1 (*)    All other components within normal limits  TROPONIN I (HIGH SENSITIVITY) - Abnormal; Notable for the following components:   Troponin I (High Sensitivity) 54 (*)    All other components within normal limits  TROPONIN I (HIGH SENSITIVITY) - Abnormal; Notable for the following components:   Troponin I (High Sensitivity) 54 (*)    All other components within normal limits  TSH  T4, FREE   ____________________________________________  EKG  ED ECG REPORT I, Naaman Plummer, the attending physician, personally viewed and interpreted this ECG.  Date: 11/04/2020 EKG Time: 0951 Rate: 72 Rhythm: Atrially sensed ventricularly paced rhythm QRS Axis: normal Intervals: normal ST/T Wave abnormalities: normal Narrative Interpretation: Atrially sensed ventricularly paced rhythm.  No evidence of acute ischemia  ____________________________________________  RADIOLOGY  ED MD interpretation: 2 view x-ray of the chest shows pulmonary edema  Official radiology report(s): DG Chest 2 View  Result Date: 11/04/2020 CLINICAL DATA:  hypoxia, SHOB EXAM: CHEST - 2 VIEW COMPARISON:  08/07/2020. FINDINGS: Marked enlargement the cardiac silhouette. Left subclavian approach cardiac rhythm maintenance device. Calcific atherosclerosis of the aorta. Increased perihilar opacities  bilaterally. IMPRESSION: 1. Increased perihilar opacities bilaterally, probably edema. Infection is not excluded. 2. Marked cardiomegaly. Electronically Signed   By: Margaretha Sheffield M.D.   On: 11/04/2020 10:46    ____________________________________________   PROCEDURES  Procedure(s) performed (including Critical Care):  .1-3 Lead EKG Interpretation Performed by: Naaman Plummer, MD Authorized by: Naaman Plummer, MD     Interpretation: abnormal     ECG rate:  79   ECG rate assessment: normal     Rhythm: paced     Ectopy: none     Conduction: normal    CRITICAL CARE Performed by: Naaman Plummer   Total critical care time: 35 minutes  Critical care time was exclusive of separately billable procedures and treating other patients.  Critical care was necessary to treat or prevent imminent or life-threatening deterioration.  Critical care was time spent personally by me on the following activities: development of treatment plan with patient and/or surrogate as well as nursing, discussions with consultants, evaluation of patient's response to treatment, examination of patient, obtaining history from patient or surrogate, ordering and performing treatments and interventions, ordering and review of laboratory studies, ordering and review of radiographic studies, pulse oximetry and re-evaluation of patient's condition.  ____________________________________________   INITIAL IMPRESSION / ASSESSMENT AND PLAN / ED COURSE  As part of my medical decision making, I reviewed the following data within the electronic medical record, if available:  Nursing notes reviewed and incorporated, Labs reviewed, EKG interpreted, Old chart reviewed, Radiograph reviewed and Notes from prior ED visits reviewed and incorporated        Endorses dyspnea endorses LE edema Denies Non adherence to medication regimen  Workup: ECG, CBC, BMP, Troponin, BNP, CXR Findings: EKG: No STEMI and no evidence of  Brugadas sign, delta wave, epsilon wave, significantly prolonged QTc, or malignant arrhythmia. BNP: 2657 CXR: Pulmonary edema Based on history, exam and findings, presentation most consistent with acute on chronic heart failure. Low suspicion for PNA, ACS, tamponade, aortic dissection. Interventions: Oxygen, Diuresis  Reassessment: Symptoms improved in ED with oxygen and diuresis.  Patient also positive for influenza A  Disposition (Stable but not significantly improved): Admit to medicine for further monitoring and for improvement of medication regimen to control symptoms.      ____________________________________________   FINAL CLINICAL  IMPRESSION(S) / ED DIAGNOSES  Final diagnoses:  None     ED Discharge Orders     None        Note:  This document was prepared using Dragon voice recognition software and may include unintentional dictation errors.    Naaman Plummer, MD 11/04/20 1537

## 2020-11-04 NOTE — Assessment & Plan Note (Signed)
Continue Eliquis for her A. fib

## 2020-11-04 NOTE — Assessment & Plan Note (Signed)
Chronic. 

## 2020-11-04 NOTE — Assessment & Plan Note (Signed)
Due to increase work of breathing, O2 sats and cyanosis patient was referred to the ER for further evaluation and management. Patients daughter with her and agrees to plan of care.  Increase O2 in the office to 5L.

## 2020-11-04 NOTE — Assessment & Plan Note (Signed)
Continue with 5 L/min supplemental O2. Wean to 3 L/min(home dose) as able.

## 2020-11-04 NOTE — ED Triage Notes (Addendum)
Pt comes into the ED via POV c/o hypoxia.  Pt states that she wears 3 L at baseline, but her oxygen level has still been around 86% at home.  Pt presents today after riding in the car with no oxygen and initial saturation level at 49% room air.  Pt placed on non-rebreather to increase O2 saturation levels.  Pt O2 levels able to be brought back up to 99% on 15 L NRB so patient switched back to nasal cannula at 4L and able to maintain O2 saturation.  Pt denies any CP, dizziness, nausea, or weakness.

## 2020-11-04 NOTE — Progress Notes (Signed)
PHARMACY NOTE:  ANTIMICROBIAL RENAL DOSAGE ADJUSTMENT  Current antimicrobial regimen includes a mismatch between antimicrobial dosage and estimated renal function.  As per policy approved by the Pharmacy & Therapeutics and Medical Executive Committees, the antimicrobial dosage will be adjusted accordingly.  Current antimicrobial dosage:  Tamiflu 30 mg BID  Indication: Influenza treatment  Renal Function:  Estimated Creatinine Clearance: 20.6 mL/min (A) (by C-G formula based on SCr of 2.46 mg/dL (H)).    Antimicrobial dosage has been changed to:  Tamiflu 30 mg daily  Additional comments: Based on dosing recommendations for CrCl > 10 to 30 mL/min   Thank you for allowing pharmacy to be a part of this patient's care.  Benita Gutter, [facility] 11/04/2020 8:10 PM

## 2020-11-04 NOTE — Assessment & Plan Note (Signed)
Continue Eliquis, coreg, digoxin, amiodarone.

## 2020-11-04 NOTE — Assessment & Plan Note (Signed)
Continue home blood pressure medications. °

## 2020-11-04 NOTE — Assessment & Plan Note (Signed)
Currently not exacerbated but would have a low threshold to start steroids given her severe COPD and concomitant influenza A infection.

## 2020-11-04 NOTE — Assessment & Plan Note (Signed)
Baseline creatinine approximately 2.1.  Monitor serum creatinine during aggressive diuresis.

## 2020-11-04 NOTE — Assessment & Plan Note (Addendum)
Continue with IV lasix 40 mg IV q12h. Monitor Scr due to CKd stage 3. Last echo 08-2020 showed LVEF of 20 to 25%

## 2020-11-04 NOTE — Assessment & Plan Note (Signed)
Admit to med surg bed. Continue tamiflu and supportive care.

## 2020-11-04 NOTE — H&P (Signed)
History and Physical    Maria Neal HDQ:[id number] DOB: [birth year] DOA: 11/04/2020  PCP: Jon Billings, NP   Patient coming from: Clinic  I have personally briefly reviewed patient's old medical records in [place]  CC: Sent from PCP office due to hypoxia HPI: 70 year old female with a history of chronic A. fib, chronic systolic heart failure with an EF of 25% status post ICD placement, severe COPD, chronic hypoxic respiratory failure on 3 L of oxygen at home, CKD stage III with a baseline creatinine approximately 2.1 who presents to the ER from the PCP office.  Patient states that she has been having fever, chills, body aches along with shortness of breath since Saturday, October 29.  Patient states that she is ready had her flu shot, COVID booster and pneumonia shot.  Patient denies any diarrhea, chest pain.  Patient was seen in clinic today.  She was noted to be hypoxic with O2 saturations of 76%.  Patient placed on 5 L of oxygen and sent to the ER.  Arrival to the ER, room air saturations were 49%.  She was placed initially on 15 L of oxygen then weaned to 4 L.  Initial ABGs showed a pH of 7.45 PCO2 of 56 PO2 of 84.  CBC white count 4.6, hemoglobin 10.1, platelets 128 Chemistry sodium 134, potassium 4.8, BUN of 65, creatinine 2.4  BNP elevated at 2657  COVID PCR is negative, influenza A by PCR was positive.  Chest x-ray demonstrated pulmonary edema.  Due to the patient's worsening hypoxemia, influenza a and CHF exacerbation, triad hospitalist contacted for admission.   ED Course: noted to have room air saturations of 49%.  Patient started on supplemental oxygen.  COVID test was negative.  Influenza test was positive.  Chest x-ray showed pulm edema.  Patient given 60 mg of IV Lasix.  Patient started on Tamiflu.  Review of Systems:  Review of Systems  Constitutional:  Positive for chills, fever and malaise/fatigue.  HENT: Negative.    Eyes: Negative.    Respiratory:  Positive for cough and shortness of breath.   Cardiovascular:  Positive for leg swelling.  Gastrointestinal:  Negative for nausea and vomiting.  Genitourinary: Negative.   Musculoskeletal:  Positive for myalgias.  Skin: Negative.   Neurological: Negative.   Endo/Heme/Allergies: Negative.   Psychiatric/Behavioral: Negative.    All other systems reviewed and are negative.  Past Medical History:  Diagnosis Date   Acute appendicitis 02/17/2018   AICD (automatic cardioverter/defibrillator) present    Anxiety state    CAD (coronary artery disease)    Cardiomyopathy (HCC)    CHF (congestive heart failure) (HCC)    COPD (chronic obstructive pulmonary disease) ([place])    Dyspnea    Headache    History of kidney stones    History of shingles    Hypertension    On home oxygen therapy    bedtime and prn   Restless leg syndrome     Past Surgical History:  Procedure Laterality Date   CARDIAC CATHETERIZATION  04/2014   Dr. Idelle Leech   CARDIAC CATHETERIZATION     CARDIAC DEFIBRILLATOR PLACEMENT     CARDIOVERSION N/A 08/11/2020   Procedure: CARDIOVERSION;  Surgeon: Dixie Dials, MD;  Location: [facility] ENDOSCOPY;  Service: Cardiovascular;  Laterality: N/A;   CHOLECYSTECTOMY N/A 08/23/2016   Procedure: LAPAROSCOPIC CHOLECYSTECTOMY;  Surgeon: Clayburn Pert, MD;  Location: [facility] ORS;  Service: General;  Laterality: N/A;   [place] N/A 06/27/2018   Procedure:  DILATATION & CURETTAGE/HYSTEROSCOPY WITH MYOSURE;  Surgeon: Malachy Mood, MD;  Location: [facility] ORS;  Service: Gynecology;  Laterality: N/A;   HERNIA REPAIR     LAPAROSCOPIC APPENDECTOMY N/A 02/17/2018   Procedure: APPENDECTOMY LAPAROSCOPIC;  Surgeon: Benjamine Sprague, DO;  Location: [facility] ORS;  Service: General;  Laterality: N/A;   PPM GENERATOR CHANGEOUT N/A 09/14/2020   Procedure: PPM GENERATOR CHANGEOUT;  Surgeon: Isaias Cowman, MD;  Location: [place] CV LAB;  Service:  Cardiovascular;  Laterality: N/A;   TEE WITHOUT CARDIOVERSION N/A 08/10/2020   Procedure: TRANSESOPHAGEAL ECHOCARDIOGRAM (TEE);  Surgeon: Dixie Dials, MD;  Location: [facility] ENDOSCOPY;  Service: Cardiovascular;  Laterality: N/A;     reports that she has quit smoking. Her smoking use included cigarettes. She smoked an average of .25 packs per day. She has never used smokeless tobacco. She reports that she does not drink alcohol and does not use drugs.  Allergies  Allergen Reactions   Levaquin [Levofloxacin] Anaphylaxis   Amoxicillin Hives    Did it involve swelling of the face/tongue/throat, SOB, or low BP? No Did it involve sudden or severe rash/hives, skin peeling, or any reaction on the inside of your mouth or nose? No Did you need to seek medical attention at a hospital or doctor's office? No When did it last happen?      10+ years If all above answers are "NO", may proceed with cephalosporin use.   Nitrofuran Derivatives Other (See Comments)    Unknown   Penicillins Other (See Comments)    Thinks it made her itch a lot, but isn't sure. Did it involve swelling of the face/tongue/throat, SOB, or low BP? No Did it involve sudden or severe rash/hives, skin peeling, or any reaction on the inside of your mouth or nose? No Did you need to seek medical attention at a hospital or doctor's office? No When did it last happen?      10+ years If all above answers are "NO", may proceed with cephalosporin use.    Zithromax [Azithromycin] Other (See Comments)   Antihistamines, Chlorpheniramine-Type Rash    Family History  Problem Relation Age of Onset   Diabetes Mellitus II Mother    Hypertension Mother    Heart attack Father    Heart disease Brother    Arthritis Sister    Depression Daughter    Depression Son    Schizophrenia Son    Arthritis Sister    Diabetes Brother     Prior to Admission medications   Medication Sig Start Date End Date Taking? Authorizing Provider  albuterol  (PROVENTIL HFA;VENTOLIN HFA) 108 (90 Base) MCG/ACT inhaler Inhale 2 puffs into the lungs every 6 (six) hours as needed for wheezing or shortness of breath.    [provider]  alum & mag hydroxide-simeth (MAALOX/MYLANTA) 200-200-20 MG/5ML suspension Take 30 mLs by mouth every 4 (four) hours as needed for indigestion or heartburn. 08/13/20   Dixie Dials, MD  amiodarone (PACERONE) 200 MG tablet Take 1 tablet (200 mg total) by mouth daily. 08/13/20   Dixie Dials, MD  apixaban (ELIQUIS) 5 MG TABS tablet Take 1 tablet (5 mg total) by mouth 2 (two) times daily. 08/13/20   Dixie Dials, MD  carvedilol (COREG) 12.5 MG tablet Take 12.5 mg by mouth 2 (two) times daily. 08/16/20   [provider]  clotrimazole-betamethasone (LOTRISONE) cream Apply 1 application topically daily. 10/12/20   Jon Billings, NP  cyclobenzaprine (FLEXERIL) 5 MG tablet TAKE 1 TABLET BY MOUTH 3 TIMES DAILY AS NEEDED  Patient not taking: No sig reported 04/04/19   Lucilla Lame, MD  digoxin 62.5 MCG TABS Take 0.0625 mg by mouth daily. 08/14/20   Dixie Dials, MD  fluticasone (FLONASE) 50 MCG/ACT nasal spray Place 2 sprays into both nostrils daily. 07/26/18   Volney American, PA-C  Fluticasone-Umeclidin-Vilant (TRELEGY ELLIPTA) 100-62.5-25 MCG/INH AEPB Inhale 1 puff into the lungs daily. 02/12/20   [provider]  gabapentin (NEURONTIN) 100 MG capsule Take 1 capsule (100 mg total) by mouth at bedtime. 08/13/20   Dixie Dials, MD  levothyroxine (SYNTHROID) 75 MCG tablet Take 1 tablet (75 mcg total) by mouth daily. 10/08/20   Jon Billings, NP  nystatin cream (MYCOSTATIN) Apply 1 application topically 2 (two) times daily. 09/23/20   Jon Billings, NP  omeprazole (PRILOSEC) 20 MG capsule TAKE 1 CAPSULE BY MOUTH ONCE DAILY 10/01/20   Jon Billings, NP  OXYGEN Inhale 3 L into the lungs 3 (three) times daily as needed (shortness of breath or coughing).    [provider]  spironolactone  (ALDACTONE) 25 MG tablet Take 12.5 mg by mouth daily. 04/09/17   [provider]  torsemide (DEMADEX) 20 MG tablet Take 1 tablet (20 mg total) by mouth once a week. Patient taking differently: Take 20 mg by mouth daily. 08/16/20   Dixie Dials, MD    Physical Exam: Vitals:   11/04/20 0955 11/04/20 0956 11/04/20 1100 11/04/20 1130  BP:   (!) 114/52 (!) 112/56  Pulse:   72 72  Resp:   18 (!) 25  Temp:      TempSrc:      SpO2: 99% 100% 97% 97%  Weight:      Height:        Physical Exam Vitals and nursing note reviewed.  Constitutional:      General: She is not in acute distress.    Appearance: Normal appearance. She is not toxic-appearing or diaphoretic.     Comments: Chronically ill-appearing white female  HENT:     Head: Normocephalic and atraumatic.     Nose: Nose normal. No rhinorrhea.  Eyes:     General:        Right eye: No discharge.        Left eye: No discharge.  Cardiovascular:     Rate and Rhythm: Normal rate and regular rhythm.     Pulses: Normal pulses.  Pulmonary:     Breath sounds: Examination of the right-upper field reveals wheezing and rales. Examination of the left-upper field reveals wheezing and rales. Examination of the right-middle field reveals rales. Examination of the left-middle field reveals rales. Examination of the right-lower field reveals rales. Examination of the left-lower field reveals rales. Wheezing and rales present.     Comments: Only slight upper lobe wheeze Abdominal:     General: Abdomen is flat. Bowel sounds are normal. There is no distension.     Palpations: Abdomen is soft.     Tenderness: There is no abdominal tenderness. There is no guarding or rebound.  Musculoskeletal:     Right lower leg: Edema present.     Left lower leg: Edema present.     Comments: Bilateral LE venous stasis +1 pitting pretibial edema bilaterally  Skin:    General: Skin is warm and dry.     Capillary Refill: Capillary refill takes less than 2  seconds.  Neurological:     General: No focal deficit present.     Mental Status: She is alert and oriented to person, place,  and time.     Labs on Admission: I have personally reviewed following labs and imaging studies  CBC: Recent Labs  Lab 11/04/20 0956  WBC 4.6  HGB 10.1*  HCT 33.0*  MCV 93.8  PLT 109*   Basic Metabolic Panel: Recent Labs  Lab 11/04/20 0956  NA 134*  K 4.8  CL 89*  CO2 34*  GLUCOSE 101*  BUN 65*  CREATININE 2.46*  CALCIUM 8.9   GFR: Estimated Creatinine Clearance: 20.6 mL/min (A) (by C-G formula based on SCr of 2.46 mg/dL (H)). Liver Function Tests: No results for input(s): AST, ALT, ALKPHOS, BILITOT, PROT, ALBUMIN in the last 168 hours. No results for input(s): LIPASE, AMYLASE in the last 168 hours. No results for input(s): AMMONIA in the last 168 hours. Coagulation Profile: No results for input(s): INR, PROTIME in the last 168 hours. Cardiac Enzymes: No results for input(s): CKTOTAL, CKMB, CKMBINDEX, TROPONINI in the last 168 hours. BNP (last 3 results) No results for input(s): PROBNP in the last 8760 hours. HbA1C: No results for input(s): HGBA1C in the last 72 hours. CBG: No results for input(s): GLUCAP in the last 168 hours. Lipid Profile: No results for input(s): CHOL, HDL, LDLCALC, TRIG, CHOLHDL, LDLDIRECT in the last 72 hours. Thyroid Function Tests: No results for input(s): TSH, T4TOTAL, FREET4, T3FREE, THYROIDAB in the last 72 hours. Anemia Panel: No results for input(s): VITAMINB12, FOLATE, FERRITIN, TIBC, IRON, RETICCTPCT in the last 72 hours. Urine analysis:    Component Value Date/Time   COLORURINE YELLOW (A) 10/06/2018 1737   APPEARANCEUR Cloudy (A) 09/23/2020 1100   LABSPEC 1.012 10/06/2018 1737   LABSPEC 1.021 09/21/2012 1516   PHURINE 6.0 10/06/2018 1737   GLUCOSEU Negative 09/23/2020 1100   GLUCOSEU Negative 09/21/2012 1516   HGBUR NEGATIVE 10/06/2018 1737   BILIRUBINUR Negative 09/23/2020 1100   BILIRUBINUR  Negative 09/21/2012 1516   KETONESUR NEGATIVE 10/06/2018 1737   PROTEINUR 1+ (A) 09/23/2020 1100   PROTEINUR NEGATIVE 10/06/2018 1737   NITRITE Negative 09/23/2020 1100   NITRITE NEGATIVE 10/06/2018 1737   LEUKOCYTESUR 1+ (A) 09/23/2020 1100   LEUKOCYTESUR TRACE (A) 10/06/2018 1737   LEUKOCYTESUR Negative 09/21/2012 1516    Radiological Exams on Admission: I have personally reviewed images DG Chest 2 View  Result Date: 11/04/2020 CLINICAL DATA:  hypoxia, SHOB EXAM: CHEST - 2 VIEW COMPARISON:  08/07/2020. FINDINGS: Marked enlargement the cardiac silhouette. Left subclavian approach cardiac rhythm maintenance device. Calcific atherosclerosis of the aorta. Increased perihilar opacities bilaterally. IMPRESSION: 1. Increased perihilar opacities bilaterally, probably edema. Infection is not excluded. 2. Marked cardiomegaly. Electronically Signed   By: Margaretha Sheffield M.D.   On: 11/04/2020 10:46    EKG: I have personally reviewed EKG: paced rhythm   Assessment/Plan Principal Problem:   Influenza A Active Problems:   Acute on chronic systolic CHF (congestive heart failure) (HCC)   Acute on chronic respiratory failure with hypoxia (HCC) - home O2 @ 3 L/min   Chronic a-fib (HCC)   Benign essential HTN   COPD, severe (HCC)   Chronic venous insufficiency   Chronic kidney disease, stage 3a ([place])   Current use of long term anticoagulation    Influenza A Admit to med surg bed. Continue tamiflu and supportive care.  Acute on chronic systolic CHF (congestive heart failure) (HCC) Continue with IV lasix 40 mg IV q12h. Monitor Scr due to CKd stage 3. Last echo 08-2020 showed LVEF of 20 to 25%  Acute on chronic respiratory failure with hypoxia ([place]) - home  O2 @ 3 L/min Continue with 5 L/min supplemental O2. Wean to 3 L/min(home dose) as able.  Chronic a-fib (HCC) Continue Eliquis, coreg, digoxin, amiodarone.  Benign essential HTN Continue home blood pressure medications  COPD, severe  (HCC) Currently not exacerbated but would have a low threshold to start steroids given her severe COPD and concomitant influenza A infection.  Chronic venous insufficiency Chronic.  Chronic kidney disease, stage 3a (HCC) Baseline creatinine approximately 2.1.  Monitor serum creatinine during aggressive diuresis.  Current use of long term anticoagulation Continue Eliquis for her A. fib  DVT prophylaxis: Eliquis Code Status: Full Code Family Communication: no family at bedside  Disposition Plan: return home  Consults called: none  Admission status: Inpatient, Med-Surg   Kristopher Oppenheim, DO [facility] 11/04/2020, 1:13 PM

## 2020-11-04 NOTE — Subjective & Objective (Signed)
CC: Sent from PCP office due to hypoxia HPI: 70 year old female with a history of chronic A. fib, chronic systolic heart failure with an EF of 25% status post ICD placement, severe COPD, chronic hypoxic respiratory failure on 3 L of oxygen at home, CKD stage III with a baseline creatinine approximately 2.1 who presents to the ER from the PCP office.  Patient states that she has been having fever, chills, body aches along with shortness of breath since Saturday, October 29.  Patient states that she is ready had her flu shot, COVID booster and pneumonia shot.  Patient denies any diarrhea, chest pain.  Patient was seen in clinic today.  She was noted to be hypoxic with O2 saturations of 76%.  Patient placed on 5 L of oxygen and sent to the ER.  Arrival to the ER, room air saturations were 49%.  She was placed initially on 15 L of oxygen then weaned to 4 L.  Initial ABGs showed a pH of 7.45 PCO2 of 56 PO2 of 84.  CBC white count 4.6, hemoglobin 10.1, platelets 128 Chemistry sodium 134, potassium 4.8, BUN of 65, creatinine 2.4  BNP elevated at 2657  COVID PCR is negative, influenza A by PCR was positive.  Chest x-ray demonstrated pulmonary edema.  Due to the patient's worsening hypoxemia, influenza a and CHF exacerbation, triad hospitalist contacted for admission.

## 2020-11-05 LAB — CBC WITH DIFFERENTIAL/PLATELET
Abs Immature Granulocytes: 0.02 10*3/uL (ref 0.00–0.07)
Basophils Absolute: 0 10*3/uL (ref 0.0–0.1)
Basophils Relative: 1 %
Eosinophils Absolute: 0.2 10*3/uL (ref 0.0–0.5)
Eosinophils Relative: 4 %
HCT: 31.6 % — ABNORMAL LOW (ref 36.0–46.0)
Hemoglobin: 9.5 g/dL — ABNORMAL LOW (ref 12.0–15.0)
Immature Granulocytes: 1 %
Lymphocytes Relative: 29 %
Lymphs Abs: 1.1 10*3/uL (ref 0.7–4.0)
MCH: 28.4 pg (ref 26.0–34.0)
MCHC: 30.1 g/dL (ref 30.0–36.0)
MCV: 94.6 fL (ref 80.0–100.0)
Monocytes Absolute: 0.4 10*3/uL (ref 0.1–1.0)
Monocytes Relative: 11 %
Neutro Abs: 2.2 10*3/uL (ref 1.7–7.7)
Neutrophils Relative %: 54 %
Platelets: 122 10*3/uL — ABNORMAL LOW (ref 150–400)
RBC: 3.34 MIL/uL — ABNORMAL LOW (ref 3.87–5.11)
RDW: 20.3 % — ABNORMAL HIGH (ref 11.5–15.5)
WBC: 3.9 10*3/uL — ABNORMAL LOW (ref 4.0–10.5)
nRBC: 0 % (ref 0.0–0.2)

## 2020-11-05 LAB — COMPREHENSIVE METABOLIC PANEL
ALT: 14 U/L (ref 0–44)
AST: 22 U/L (ref 15–41)
Albumin: 3.6 g/dL (ref 3.5–5.0)
Alkaline Phosphatase: 55 U/L (ref 38–126)
Anion gap: 11 (ref 5–15)
BUN: 66 mg/dL — ABNORMAL HIGH (ref 8–23)
CO2: 36 mmol/L — ABNORMAL HIGH (ref 22–32)
Calcium: 9 mg/dL (ref 8.9–10.3)
Chloride: 90 mmol/L — ABNORMAL LOW (ref 98–111)
Creatinine, Ser: 2.46 mg/dL — ABNORMAL HIGH (ref 0.44–1.00)
GFR, Estimated: 21 mL/min — ABNORMAL LOW (ref >60)
Glucose, Bld: 81 mg/dL (ref 70–99)
Potassium: 4 mmol/L (ref 3.5–5.1)
Sodium: 137 mmol/L (ref 135–145)
Total Bilirubin: 1.6 mg/dL — ABNORMAL HIGH (ref 0.3–1.2)
Total Protein: 7.2 g/dL (ref 6.5–8.1)

## 2020-11-05 LAB — GLUCOSE, CAPILLARY
Glucose-Capillary: 77 mg/dL (ref 70–99)
Glucose-Capillary: 157 mg/dL — ABNORMAL HIGH (ref 70–99)

## 2020-11-05 LAB — MAGNESIUM: Magnesium: 2.3 mg/dL (ref 1.7–2.4)

## 2020-11-05 MED ORDER — PREDNISONE 20 MG PO TABS
40.0000 mg | ORAL_TABLET | Freq: Every day | ORAL | Status: DC
  Administered 2020-11-05 – 2020-11-07 (×3): 40 mg via ORAL
  Filled 2020-11-05 (×3): qty 2

## 2020-11-05 NOTE — Evaluation (Signed)
Physical Therapy Evaluation Patient Details Name: ANNALIESA BLANN MRN: [id number] DOB: [birth year] Today's Date: 11/05/2020  History of Present Illness  Pt is a 70 y.o arriving to ED from PCPs office after presenting with SOB, body aches, fever and admitted for the management of influenza, CHF exacerbation. PMH includes chronic a-fib, CHF s/p ICD placement, COPD on 3L at home  Clinical Impression  Pt alert sitting up in chair on 6L HFNC. Pt is pleasant and cooperative and indicates MOD-I for mobility and ADLs, family drives to appointments. Pt currently utilizes 3L O2 at home, but was unable to maintain 3-5L O2 with oxygen stats above 88% at rest or mobility. Patient appears to be at baseline functionally, supervision w/ RW for safety without LOB but does demonstrate weakness and require rest breaks due to fatigue. HHPT with current PT provider recommended at discharge. Skilled PT intervention is indicated to address deficits in function, mobility, and to return to PLOF as able.       Recommendations for follow up therapy are one component of a multi-disciplinary discharge planning process, led by the attending physician.  Recommendations may be updated based on patient status, additional functional criteria and insurance authorization.  Follow Up Recommendations Home health PT (Continue with current HHPT providers)    Assistance Recommended at Discharge Intermittent Supervision/Assistance  Functional Status Assessment Patient has not had a recent decline in their functional status  Equipment Recommendations  None recommended by PT    Recommendations for Other Services       Precautions / Restrictions Precautions Precautions: Fall;ICD/Pacemaker Restrictions Weight Bearing Restrictions: No      Mobility  Bed Mobility               General bed mobility comments: Pt seated in chair    Transfers Overall transfer level: Needs assistance Equipment used: Rolling walker (2  wheels) Transfers: Sit to/from Stand Sit to Stand: Supervision           General transfer comment: Cue for hand placement x 1, no instability noted    Ambulation/Gait Ambulation/Gait assistance: Supervision Gait Distance (Feet): 60 Feet Assistive device: Rolling walker (2 wheels) Gait Pattern/deviations: Trunk flexed;Narrow base of support;Decreased step length - left;Decreased stance time - right     General Gait Details: Pt mainained on 6L HFNC at 90-92%, no LOB or instability, recommedned RW due to decreased strength and endurance  Stairs            Wheelchair Mobility    Modified Rankin (Stroke Patients Only)       Balance Overall balance assessment: Needs assistance Sitting-balance support: Feet supported;No upper extremity supported Sitting balance-Leahy Scale: Good       Standing balance-Leahy Scale: Fair Standing balance comment: able to stand statically without support, dynamic requires BUE                             Pertinent Vitals/Pain Pain Assessment: No/denies pain    Home Living Family/patient expects to be discharged to:: Private residence Living Arrangements: Children (son) Available Help at Discharge: Available 24 hours/day Type of Home: Mobile home Home Access: Stairs to enter Entrance Stairs-Rails: Can reach both Entrance Stairs-Number of Steps: 6 in front 2 in back   Home Layout: One level Home Equipment: Conservation officer, nature (2 wheels);Cane - single point;Tub bench      Prior Function               Mobility Comments: Pt  MOD I for amb for household distances w/ 2-RW or SPC; denies falls last six months; currently working with a PT x2/week for general strength and functional mobility ADLs Comments: Family has been driving more recently, indep for all other ADLs     Hand Dominance        Extremity/Trunk Assessment   Upper Extremity Assessment Upper Extremity Assessment: Overall WFL for tasks assessed (4/5 bicep,  tricep)    Lower Extremity Assessment Lower Extremity Assessment: Generalized weakness (able to hold against gravity 4-/5 for knee flexors, knee extensors; 5/5 dorsiflexors)       Communication      Cognition Arousal/Alertness: Awake/alert Behavior During Therapy: WFL for tasks assessed/performed Overall Cognitive Status: Within Functional Limits for tasks assessed                                 General Comments: oriented to person, DOB, situation; silghty disoriented to place [employer] as hospital) but able to converse about family; slow to respond requiring redirection of task on several occasions        General Comments General comments (skin integrity, edema, etc.): 6L HFNC SpO2 93-96%; BP (seated) 94/47    Exercises     Assessment/Plan    PT Assessment Patient needs continued PT services  PT Problem List Decreased strength;Decreased range of motion;Decreased activity tolerance;Decreased balance;Decreased mobility       PT Treatment Interventions      PT Goals (Current goals can be found in the Care Plan section)  Acute Rehab PT Goals Patient Stated Goal: To go home PT Goal Formulation: With patient Time For Goal Achievement: 11/19/20 Potential to Achieve Goals: Good    Frequency Min 2X/week   Barriers to discharge        Co-evaluation               AM-PAC PT "6 Clicks" Mobility  Outcome Measure Help needed turning from your back to your side while in a flat bed without using bedrails?: None Help needed moving from lying on your back to sitting on the side of a flat bed without using bedrails?: A Little Help needed moving to and from a bed to a chair (including a wheelchair)?: A Little Help needed standing up from a chair using your arms (e.g., wheelchair or bedside chair)?: None Help needed to walk in hospital room?: A Little Help needed climbing 3-5 steps with a railing? : A Lot 6 Click Score: 19    End of Session  Equipment Utilized During Treatment: Gait belt;Oxygen Activity Tolerance: Patient tolerated treatment well Patient left: in chair;with call bell/phone within reach;with chair alarm set Nurse Communication: Mobility status PT Visit Diagnosis: Muscle weakness (generalized) (M62.81);Difficulty in walking, not elsewhere classified (R26.2)    Time: 8841-6606 PT Time Calculation (min) (ACUTE ONLY): 38 min   Charges:              [employer], SPT

## 2020-11-05 NOTE — Progress Notes (Addendum)
PROGRESS NOTE    Maria Neal  KCL:[id number] DOB: 04-29-1950 DOA: 11/04/2020 PCP: Jon Billings, NP   Chief Complain: Sent from PCP office due to hypoxia  Brief Narrative:  Patient is a 70 year old female with history of chronic A. fib, chronic systolic heart failure with EF of 25% status post ICD placement, severe COPD, chronic hypoxic respiratory failure on 3 L of oxygen at home, CKD stage IIIb with baseline creatinine of 2.1 who presented to the ER from PCPs office after she presented with fever, chills, body aches, shortness of breath.  In the clinic she was noted to be hypoxic with oxygen saturation of 76% and was placed on 5 L of oxygen and sent to ER.  She saturated 49% on room air on presentation and was initially placed on 15 L of oxygen then weaned to 4 L.  COVID PCR was negative.  Influenza A was positive.  Chest x-ray showed pulm edema.  Patient was admitted for the management of influenza, CHF exacerbation.  Started on Tamiflu, Lasix. Assessment & Plan:   Principal Problem:   Influenza A Active Problems:   Benign essential HTN   COPD, severe (HCC)   Chronic venous insufficiency   Chronic kidney disease, stage 3a (HCC)   Current use of long term anticoagulation   Acute on chronic systolic CHF (congestive heart failure) (HCC)   Acute on chronic respiratory failure with hypoxia (HCC) - home O2 @ 3 L/min   Chronic a-fib (HCC)   Influenza A/acute on chronic hypoxic respiratory failure: Severely hypoxic on presentation.  Presented with body aches, shortness of breath, fever, chills. Started on Tamiflu.  Currently on 5 L of oxygen per minute. On 3 L of oxygen per minute at home.we will attempt to wean the oxygen  Acute on chronic systolic congestive heart failure: Known EF of 25% as per echo done on 08/2020, status post ICD placement.  Looked volume overloaded on presentation but elevated BNP, chest ray showed pulmonary edema.  Started on Lasix 40 mg IV every 12 hr. we will  continue IV Lasix for today.  Patient still looks volume overloaded.  Chronic A. fib: On Eliquis, Coreg, digoxin, amiodarone.  Currently rate is controlled.  Hypertension: Currently blood pressure stable.  Continue current medications  History of history of COPD: Found to be wheezing today, started on prednisone, continue bronchodilators as needed.  No indication for extra treatment yet.  CKD stage IIIb: Baseline creatinine around 2.1.  Kidney function stable at baseline.  History of chronic venous insufficiency: Stable  Debility/generalized weakness: We will request for PT/OT evaluation.  Patient ambulates with help of walker.            DVT prophylaxis:Eliquis Code Status: Full Family Communication: Called daughter on phone on 11/05/2020 Status is: Inpatient Remains inpatient appropriate because: Severe hypoxia secondary to  influenza A      Consultants: None  Procedures:  Antimicrobials:  Anti-infectives (From admission, onward)    Start     Dose/Rate Route Frequency Ordered Stop   11/05/20 1000  oseltamivir (TAMIFLU) capsule 30 mg        30 mg Oral Daily 11/04/20 2000 11/09/20 0959   11/04/20 1245  oseltamivir (TAMIFLU) capsule 30 mg        30 mg Oral  Once 11/04/20 1237 11/04/20 1527       Subjective:  Patient seen and examined at the bedside this morning.  Hemodynamically stable.  She feels better today.  Currently on 4 L of oxygen  per minute.  Denies any worsening shortness of breath or cough.  Still has bilateral lower extremity edema   Objective: Vitals:   11/05/20 0207 11/05/20 0442 11/05/20 0500 11/05/20 0728  BP: (!) 119/57 (!) 113/53  112/61  Pulse: 81 70  71  Resp: 17 17  20   Temp: 98.5 F (36.9 C) 97.6 F (36.4 C)    TempSrc: Oral     SpO2:  100%  98%  Weight:   72.4 kg   Height:        Intake/Output Summary (Last 24 hours) at 11/05/2020 0800 Last data filed at 11/05/2020 0456 Gross per 24 hour  Intake --  Output 500 ml  Net -500 ml    Filed Weights   11/04/20 0952 11/05/20 0500  Weight: 76.3 kg 72.4 kg    Examination:  General exam: weak, not in distress HEENT: PERRL Respiratory system: Diminished air sounds bilaterally, bilateral expiratory wheezing  cardiovascular system: S1 & S2 heard, RRR.  Gastrointestinal system: Abdomen is nondistended, soft and nontender. Central nervous system: Alert and oriented Extremities: trace bilateral lower extremity edema, no clubbing ,no cyanosis Skin: No rashes, no ulcers,no icterus       Data Reviewed: I have personally reviewed following labs and imaging studies  CBC: Recent Labs  Lab 11/04/20 0956  WBC 4.6  HGB 10.1*  HCT 33.0*  MCV 93.8  PLT 397*   Basic Metabolic Panel: Recent Labs  Lab 11/04/20 0956  NA 134*  K 4.8  CL 89*  CO2 34*  GLUCOSE 101*  BUN 65*  CREATININE 2.46*  CALCIUM 8.9   GFR: Estimated Creatinine Clearance: 20.1 mL/min (A) (by C-G formula based on SCr of 2.46 mg/dL (H)). Liver Function Tests: No results for input(s): AST, ALT, ALKPHOS, BILITOT, PROT, ALBUMIN in the last 168 hours. No results for input(s): LIPASE, AMYLASE in the last 168 hours. No results for input(s): AMMONIA in the last 168 hours. Coagulation Profile: No results for input(s): INR, PROTIME in the last 168 hours. Cardiac Enzymes: No results for input(s): CKTOTAL, CKMB, CKMBINDEX, TROPONINI in the last 168 hours. BNP (last 3 results) No results for input(s): PROBNP in the last 8760 hours. HbA1C: No results for input(s): HGBA1C in the last 72 hours. CBG: No results for input(s): GLUCAP in the last 168 hours. Lipid Profile: No results for input(s): CHOL, HDL, LDLCALC, TRIG, CHOLHDL, LDLDIRECT in the last 72 hours. Thyroid Function Tests: Recent Labs    11/04/20 1218  TSH 117.618*  FREET4 0.86   Anemia Panel: No results for input(s): VITAMINB12, FOLATE, FERRITIN, TIBC, IRON, RETICCTPCT in the last 72 hours. Sepsis Labs: No results for input(s):  PROCALCITON, LATICACIDVEN in the last 168 hours.  Recent Results (from the past 240 hour(s))  Resp Panel by RT-PCR (Flu A&B, Covid) Nasopharyngeal Swab     Status: Abnormal   Collection Time: 11/04/20 11:09 AM   Specimen: Nasopharyngeal Swab; Nasopharyngeal(NP) swabs in vial transport medium  Result Value Ref Range Status   SARS Coronavirus 2 by RT PCR NEGATIVE NEGATIVE Final    Comment: (NOTE) SARS-CoV-2 target nucleic acids are NOT DETECTED.  The SARS-CoV-2 RNA is generally detectable in upper respiratory specimens during the acute phase of infection. The lowest concentration of SARS-CoV-2 viral copies this assay can detect is 138 copies/mL. A negative result does not preclude SARS-Cov-2 infection and should not be used as the sole basis for treatment or other patient management decisions. A negative result may occur with  improper specimen collection/handling, submission of  specimen other than nasopharyngeal swab, presence of viral mutation(s) within the areas targeted by this assay, and inadequate number of viral copies(<138 copies/mL). A negative result must be combined with clinical observations, patient history, and epidemiological information. The expected result is Negative.  Fact Sheet for Patients:  EntrepreneurPulse.com.au  Fact Sheet for Healthcare Providers:  IncredibleEmployment.be  This test is no t yet approved or cleared by the [place] FDA and  has been authorized for detection and/or diagnosis of SARS-CoV-2 by FDA under an Emergency Use Authorization (EUA). This EUA will remain  in effect (meaning this test can be used) for the duration of the COVID-19 declaration under Section 564(b)(1) of the Act, 21 U.S.C.section 360bbb-3(b)(1), unless the authorization is terminated  or revoked sooner.       Influenza A by PCR POSITIVE (A) NEGATIVE Final   Influenza B by PCR NEGATIVE NEGATIVE Final    Comment: (NOTE) The Xpert  Xpress SARS-CoV-2/FLU/RSV plus assay is intended as an aid in the diagnosis of influenza from Nasopharyngeal swab specimens and should not be used as a sole basis for treatment. Nasal washings and aspirates are unacceptable for Xpert Xpress SARS-CoV-2/FLU/RSV testing.  Fact Sheet for Patients: EntrepreneurPulse.com.au  Fact Sheet for Healthcare Providers: IncredibleEmployment.be  This test is not yet approved or cleared by the [place] FDA and has been authorized for detection and/or diagnosis of SARS-CoV-2 by FDA under an Emergency Use Authorization (EUA). This EUA will remain in effect (meaning this test can be used) for the duration of the COVID-19 declaration under Section 564(b)(1) of the Act, 21 U.S.C. section 360bbb-3(b)(1), unless the authorization is terminated or revoked.  Performed at [facility], [address], [place], [place] [address]          Radiology Studies: DG Chest 2 View  Result Date: 11/04/2020 CLINICAL DATA:  hypoxia, SHOB EXAM: CHEST - 2 VIEW COMPARISON:  08/07/2020. FINDINGS: Marked enlargement the cardiac silhouette. Left subclavian approach cardiac rhythm maintenance device. Calcific atherosclerosis of the aorta. Increased perihilar opacities bilaterally. IMPRESSION: 1. Increased perihilar opacities bilaterally, probably edema. Infection is not excluded. 2. Marked cardiomegaly. Electronically Signed   By: Margaretha Sheffield M.D.   On: 11/04/2020 10:46        Scheduled Meds:  acetaZOLAMIDE  500 mg Oral BID   amiodarone  200 mg Oral Daily   apixaban  5 mg Oral BID   carvedilol  12.5 mg Oral BID WC   digoxin  0.0625 mg Oral Daily   furosemide  40 mg Intravenous BID   gabapentin  100 mg Oral QHS   guaiFENesin  600 mg Oral BID   ipratropium-albuterol  3 mL Nebulization Q6H   levothyroxine  75 mcg Oral Daily   oseltamivir  30 mg Oral Daily   pantoprazole  40 mg Oral Daily   spironolactone  12.5  mg Oral Daily   Continuous Infusions:   LOS: 1 day    Time spent: 35 mins,More than 50% of that time was spent in counseling and/or coordination of care.      Shelly Coss, MD [facility] P11/04/2020, 8:00 AM

## 2020-11-05 NOTE — Progress Notes (Signed)
Digoxin level elevated to 2.3. Notified Sharion Settler, NP. 2200 dose not given per provider. Medication administration times rescheduled by pharmacy.

## 2020-11-06 LAB — BASIC METABOLIC PANEL
Anion gap: 10 (ref 5–15)
BUN: 70 mg/dL — ABNORMAL HIGH (ref 8–23)
CO2: 37 mmol/L — ABNORMAL HIGH (ref 22–32)
Calcium: 8.9 mg/dL (ref 8.9–10.3)
Chloride: 89 mmol/L — ABNORMAL LOW (ref 98–111)
Creatinine, Ser: 2.39 mg/dL — ABNORMAL HIGH (ref 0.44–1.00)
GFR, Estimated: 21 mL/min — ABNORMAL LOW (ref >60)
Glucose, Bld: 127 mg/dL — ABNORMAL HIGH (ref 70–99)
Potassium: 4.2 mmol/L (ref 3.5–5.1)
Sodium: 136 mmol/L (ref 135–145)

## 2020-11-06 MED ORDER — FUROSEMIDE 10 MG/ML IJ SOLN
60.0000 mg | Freq: Two times a day (BID) | INTRAMUSCULAR | Status: DC
Start: 2020-11-06 — End: 2020-11-06

## 2020-11-06 MED ORDER — CARVEDILOL 3.125 MG PO TABS: 3.1250 mg | ORAL_TABLET | Freq: Two times a day (BID) | ORAL | Status: DC

## 2020-11-06 MED ORDER — LEVOTHYROXINE SODIUM 100 MCG PO TABS
100.0000 ug | ORAL_TABLET | Freq: Every day | ORAL | Status: DC
  Administered 2020-11-07: 100 ug via ORAL
  Filled 2020-11-06: qty 1

## 2020-11-06 MED ORDER — MIDODRINE HCL 5 MG PO TABS
5.0000 mg | ORAL_TABLET | Freq: Three times a day (TID) | ORAL | Status: DC
  Administered 2020-11-06 – 2020-11-07 (×2): 5 mg via ORAL
  Filled 2020-11-06 (×2): qty 1

## 2020-11-06 NOTE — Progress Notes (Signed)
PROGRESS NOTE    Maria Neal  LDJ:[id number] DOB: 10-20-1950 DOA: 11/04/2020 PCP: Jon Billings, NP   Chief Complain: Sent from PCP office due to hypoxia  Brief Narrative:  Patient is a 70 year old female with history of chronic A. fib, chronic systolic heart failure with EF of 25% status post ICD placement, severe COPD, chronic hypoxic respiratory failure on 3 L of oxygen at home, CKD stage IIIb with baseline creatinine of 2.1 who presented to the ER from PCPs office after she presented with fever, chills, body aches, shortness of breath.  In the clinic she was noted to be hypoxic with oxygen saturation of 76% and was placed on 5 L of oxygen and sent to ER.  She saturated 49% on room air on presentation and was initially placed on 15 L of oxygen then weaned to 4 L.  COVID PCR was negative.  Influenza A was positive.  Chest x-ray showed pulm edema.  Patient was admitted for the management of influenza, CHF exacerbation.  Started on Tamiflu, Lasix. Assessment & Plan:   Principal Problem:   Influenza A Active Problems:   Benign essential HTN   COPD, severe (HCC)   Chronic venous insufficiency   Chronic kidney disease, stage 3a (HCC)   Current use of long term anticoagulation   Acute on chronic systolic CHF (congestive heart failure) (HCC)   Acute on chronic respiratory failure with hypoxia (HCC) - home O2 @ 3 L/min   Chronic a-fib (HCC)   Influenza A/acute on chronic hypoxic respiratory failure: Severely hypoxic on presentation.  Presented with body aches, shortness of breath, fever, chills. Started on Tamiflu.  Currently on 5 L of oxygen per minute. On 3 L of oxygen per minute at home.we will attempt to wean the oxygen  Acute on chronic systolic congestive heart failure: Known EF of 25% as per echo done on 08/2020, status post ICD placement.  Looked volume overloaded on presentation with elevated BNP, chest ray showed pulmonary edema.  Started on Lasix 40 mg IV every 12 hr.  Patient  still looks volume overloaded.  Increasing the Lasix to 60 mg twice a day.  Chronic A. fib: On Eliquis, Coreg, digoxin, amiodarone.  Currently rate is controlled.  Hypertension: Currently blood pressure soft.  Dose of carvedilol decreased  History of history of COPD: Found to be wheezing on 11/05/20, started on prednisone, continue bronchodilators as needed.    CKD stage IIIb: Baseline creatinine around 2.1.  Creatinine slightly up from baseline.  History of chronic venous insufficiency: Stable  Debility/generalized weakness: Patient ambulates with help of walker.  PT recommended home health on discharge            DVT prophylaxis:Eliquis Code Status: Full Family Communication: Called and discussed with daughter on phone on 11/05/2020 Status is: Inpatient Remains inpatient appropriate because: Severe hypoxia secondary to  influenza A      Consultants: None  Procedures:  Antimicrobials:  Anti-infectives (From admission, onward)    Start     Dose/Rate Route Frequency Ordered Stop   11/05/20 1000  oseltamivir (TAMIFLU) capsule 30 mg        30 mg Oral Daily 11/04/20 2000 11/09/20 0959   11/04/20 1245  oseltamivir (TAMIFLU) capsule 30 mg        30 mg Oral  Once 11/04/20 1237 11/04/20 1527       Subjective:  Patient seen and examined at the bedside this morning.  She was sleeping when I arrived to the room.  She  hardly woke up on calling her name.  Still on 5 L of oxygen per minute.   Objective: Vitals:   11/06/20 0500 11/06/20 0523 11/06/20 0534 11/06/20 0752  BP:  (!) 91/37 (!) 101/49 (!) 111/53  Pulse:  69 72 71  Resp:  18 19 20   Temp:  (!) 97.5 F (36.4 C) (!) 97.5 F (36.4 C) 98.6 F (37 C)  TempSrc:  Oral Oral   SpO2:  92% 97% 98%  Weight: 77.2 kg  77.5 kg   Height:        Intake/Output Summary (Last 24 hours) at 11/06/2020 0803 Last data filed at 11/06/2020 0527 Gross per 24 hour  Intake 120 ml  Output 300 ml  Net -180 ml   Filed Weights    11/05/20 0500 11/06/20 0500 11/06/20 0534  Weight: 72.4 kg 77.2 kg 77.5 kg    Examination:  General exam: sleeping,weak HEENT: PERRL Respiratory system:  diminished air sounds bilaterally,no wheezes or crackles  Cardiovascular system: S1 & S2 heard, RRR.  Gastrointestinal system: Abdomen is nondistended, soft and nontender. Central nervous system: Alert and oriented Extremities: trace bilateral lower extremity edema, no clubbing ,no cyanosis Skin: No rashes, no ulcers,no icterus       Data Reviewed: I have personally reviewed following labs and imaging studies  CBC: Recent Labs  Lab 11/04/20 0956 11/05/20 0756  WBC 4.6 3.9*  NEUTROABS  --  2.2  HGB 10.1* 9.5*  HCT 33.0* 31.6*  MCV 93.8 94.6  PLT 128* 638*   Basic Metabolic Panel: Recent Labs  Lab 11/04/20 0956 11/05/20 0756 11/06/20 0505  NA 134* 137 136  K 4.8 4.0 4.2  CL 89* 90* 89*  CO2 34* 36* 37*  GLUCOSE 101* 81 127*  BUN 65* 66* 70*  CREATININE 2.46* 2.46* 2.39*  CALCIUM 8.9 9.0 8.9  MG  --  2.3  --    GFR: Estimated Creatinine Clearance: 21.4 mL/min (A) (by C-G formula based on SCr of 2.39 mg/dL (H)). Liver Function Tests: Recent Labs  Lab 11/05/20 0756  AST 22  ALT 14  ALKPHOS 55  BILITOT 1.6*  PROT 7.2  ALBUMIN 3.6   No results for input(s): LIPASE, AMYLASE in the last 168 hours. No results for input(s): AMMONIA in the last 168 hours. Coagulation Profile: No results for input(s): INR, PROTIME in the last 168 hours. Cardiac Enzymes: No results for input(s): CKTOTAL, CKMB, CKMBINDEX, TROPONINI in the last 168 hours. BNP (last 3 results) No results for input(s): PROBNP in the last 8760 hours. HbA1C: No results for input(s): HGBA1C in the last 72 hours. CBG: Recent Labs  Lab 11/05/20 0729 11/05/20 1154  GLUCAP 77 157*   Lipid Profile: No results for input(s): CHOL, HDL, LDLCALC, TRIG, CHOLHDL, LDLDIRECT in the last 72 hours. Thyroid Function Tests: Recent Labs    11/04/20 1218   TSH 117.618*  FREET4 0.86   Anemia Panel: No results for input(s): VITAMINB12, FOLATE, FERRITIN, TIBC, IRON, RETICCTPCT in the last 72 hours. Sepsis Labs: No results for input(s): PROCALCITON, LATICACIDVEN in the last 168 hours.  Recent Results (from the past 240 hour(s))  Resp Panel by RT-PCR (Flu A&B, Covid) Nasopharyngeal Swab     Status: Abnormal   Collection Time: 11/04/20 11:09 AM   Specimen: Nasopharyngeal Swab; Nasopharyngeal(NP) swabs in vial transport medium  Result Value Ref Range Status   SARS Coronavirus 2 by RT PCR NEGATIVE NEGATIVE Final    Comment: (NOTE) SARS-CoV-2 target nucleic acids are NOT DETECTED.  The  SARS-CoV-2 RNA is generally detectable in upper respiratory specimens during the acute phase of infection. The lowest concentration of SARS-CoV-2 viral copies this assay can detect is 138 copies/mL. A negative result does not preclude SARS-Cov-2 infection and should not be used as the sole basis for treatment or other patient management decisions. A negative result may occur with  improper specimen collection/handling, submission of specimen other than nasopharyngeal swab, presence of viral mutation(s) within the areas targeted by this assay, and inadequate number of viral copies(<138 copies/mL). A negative result must be combined with clinical observations, patient history, and epidemiological information. The expected result is Negative.  Fact Sheet for Patients:  EntrepreneurPulse.com.au  Fact Sheet for Healthcare Providers:  IncredibleEmployment.be  This test is no t yet approved or cleared by the [place] FDA and  has been authorized for detection and/or diagnosis of SARS-CoV-2 by FDA under an Emergency Use Authorization (EUA). This EUA will remain  in effect (meaning this test can be used) for the duration of the COVID-19 declaration under Section 564(b)(1) of the Act, 21 U.S.C.section 360bbb-3(b)(1), unless  the authorization is terminated  or revoked sooner.       Influenza A by PCR POSITIVE (A) NEGATIVE Final   Influenza B by PCR NEGATIVE NEGATIVE Final    Comment: (NOTE) The Xpert Xpress SARS-CoV-2/FLU/RSV plus assay is intended as an aid in the diagnosis of influenza from Nasopharyngeal swab specimens and should not be used as a sole basis for treatment. Nasal washings and aspirates are unacceptable for Xpert Xpress SARS-CoV-2/FLU/RSV testing.  Fact Sheet for Patients: EntrepreneurPulse.com.au  Fact Sheet for Healthcare Providers: IncredibleEmployment.be  This test is not yet approved or cleared by the [place] FDA and has been authorized for detection and/or diagnosis of SARS-CoV-2 by FDA under an Emergency Use Authorization (EUA). This EUA will remain in effect (meaning this test can be used) for the duration of the COVID-19 declaration under Section 564(b)(1) of the Act, 21 U.S.C. section 360bbb-3(b)(1), unless the authorization is terminated or revoked.  Performed at [facility], [address], [place],  [address]          Radiology Studies: DG Chest 2 View  Result Date: 11/04/2020 CLINICAL DATA:  hypoxia, SHOB EXAM: CHEST - 2 VIEW COMPARISON:  08/07/2020. FINDINGS: Marked enlargement the cardiac silhouette. Left subclavian approach cardiac rhythm maintenance device. Calcific atherosclerosis of the aorta. Increased perihilar opacities bilaterally. IMPRESSION: 1. Increased perihilar opacities bilaterally, probably edema. Infection is not excluded. 2. Marked cardiomegaly. Electronically Signed   By: Margaretha Sheffield M.D.   On: 11/04/2020 10:46        Scheduled Meds:  acetaZOLAMIDE  500 mg Oral BID   amiodarone  200 mg Oral Daily   apixaban  5 mg Oral BID   carvedilol  12.5 mg Oral BID WC   digoxin  0.0625 mg Oral Daily   furosemide  40 mg Intravenous BID   gabapentin  100 mg Oral QHS   guaiFENesin  600  mg Oral BID   ipratropium-albuterol  3 mL Nebulization Q6H   levothyroxine  75 mcg Oral Daily   oseltamivir  30 mg Oral Daily   pantoprazole  40 mg Oral Daily   predniSONE  40 mg Oral Q breakfast   spironolactone  12.5 mg Oral Daily   Continuous Infusions:   LOS: 2 days    Time spent: 25 mins,More than 50% of that time was spent in counseling and/or coordination of care.      Sherah Lund  Tawanna Solo, MD [facility] P11/05/2020, 8:03 AM

## 2020-11-06 NOTE — Progress Notes (Signed)
Occupational Therapy Evaluation Patient Details Name: Maria Neal MRN: [id number] DOB: 28-Aug-1950 Today's Date: 11/06/2020   History of Present Illness Pt is a 70 y.o arriving to ED from PCPs office after presenting with SOB, body aches, fever and admitted for the management of influenza, CHF exacerbation. PMH includes chronic a-fib, CHF s/p ICD placement, COPD on 3L at home   Clinical Impression   Ms. Dirk was seen for OT evaluation this date. Prior to hospital admission, pt was independent. Pt lives with son and has a daughter nearby for assistance. Currently pt demonstrates impairments as described below (See OT problem list) which functionally limit her ability to perform ADL/self-care tasks. Pt on 6L [place] SpO2. Pt currently MOD I for donning/doffing socks, seated EOC. Pt close SUP + RW for functional mobility, ambulating ~ 15 ft. Pt close SUP + VCs for grooming - toothbrushing, standing sinkside~ 5 minutes. Pt had a difficult time distinguishing toothpaste from other personal care items such as lotions and shampoo, and took an extended time to locate trash can. Pt close SUP + RW + VCs for functional reaching task, opening knee height cabinets~2 min.  Pt would benefit from skilled OT services to address noted impairments and functional limitations (see below for any additional details) in order to maximize safety and independence while minimizing falls risk and caregiver burden. Upon hospital discharge, recommend no follow-up OT services.       Recommendations for follow up therapy are one component of a multi-disciplinary discharge planning process, led by the attending physician.  Recommendations may be updated based on patient status, additional functional criteria and insurance authorization.   Follow Up Recommendations  No OT follow up    Assistance Recommended at Discharge Set up Supervision/Assistance  Functional Status Assessment  Patient has had a recent decline in their functional  status and demonstrates the ability to make significant improvements in function in a reasonable and predictable amount of time.  Equipment Recommendations  [facility]    Recommendations for Other Services       Precautions / Restrictions Precautions Precautions: Fall;ICD/Pacemaker Restrictions Weight Bearing Restrictions: No      Mobility Bed Mobility               General bed mobility comments: Pt received and left sitting in chair, nuring in room.    Transfers Overall transfer level: Needs assistance Equipment used: Rolling walker (2 wheels) Transfers: Sit to/from Stand Sit to Stand: Supervision                  Balance Overall balance assessment: Needs assistance Sitting-balance support: Feet supported;No upper extremity supported Sitting balance-Leahy Scale: Normal     Standing balance support: No upper extremity supported;During functional activity Standing balance-Leahy Scale: Fair                             ADL either performed or assessed with clinical judgement   ADL Overall ADL's : Needs assistance/impaired                                       General ADL Comments: Pt MOD I for donning/doffing socks, seated EOC. Pt close SUP + RW for functional mobility, ambulating ~ 15 ft. Pt close SUP + VCs for grooming - toothbrushing, standing sinkside~ 5 minutes. Pt close SUP + RW + VCs for functional reaching  task, opening knee height cabinets~2 min.     Vision         Perception     Praxis      Pertinent Vitals/Pain Pain Assessment: No/denies pain     Hand Dominance     Extremity/Trunk Assessment Upper Extremity Assessment Upper Extremity Assessment: Overall WFL for tasks assessed   Lower Extremity Assessment Lower Extremity Assessment: Overall WFL for tasks assessed       Communication Communication Communication: No difficulties   Cognition Arousal/Alertness: Awake/alert Behavior During Therapy: WFL for  tasks assessed/performed Overall Cognitive Status: Within Functional Limits for tasks assessed                                 General Comments: Pt had a difficult time distinguishing toothpaste from other personal care items such as lotions and shampoo.     General Comments  6L  SpO2 mostly 93%, mid 80s post functional standing tasks, recovering with PLB    Exercises Exercises: Other exercises Other Exercises Other Exercises: Pt educ re: ECS, d/c recs, safe DME use, falls prevention Other Exercises: Sit<>stand, toothbrushing, functional reaching task, nurse in room- taking vitals, ECS handout provided and covered.   Shoulder Instructions      Home Living Family/patient expects to be discharged to:: Private residence Living Arrangements: Children Available Help at Discharge: Available 24 hours/day Type of Home: Mobile home Home Access: Stairs to enter [employer] of Steps: 6 in front 2 in back Entrance Stairs-Rails: Can reach both Home Layout: One level     Bathroom Shower/Tub: Tub/shower unit         Home Equipment: Conservation officer, nature (2 wheels);Cane - single point;Tub bench          Prior Functioning/Environment Prior Level of Function : Independent/Modified Independent             Mobility Comments: Pt reports being MOD I w/ RW. ADLs Comments: Pt reports being indep (uses RW), for ADLs and IADLs, including driving and working.        OT Problem List: Decreased activity tolerance;Impaired vision/perception;Decreased safety awareness;Decreased knowledge of precautions      OT Treatment/Interventions: Self-care/ADL training;Therapeutic exercise;Energy conservation;Therapeutic activities    OT Goals(Current goals can be found in the care plan section) Acute Rehab OT Goals Patient Stated Goal: to get better OT Goal Formulation: With patient Time For Goal Achievement: 11/20/20 Potential to Achieve Goals: Good ADL Goals Additional ADL  Goal #1: Pt will verbalize plan to implement 3/3 ECS. Additional ADL Goal #2: Pt will implement 3 visual scanning strategies. Additional ADL Goal #3: Pt will identify falls and saftey hazards in 3/3 activities.  OT Frequency: Min 2X/week   Barriers to D/C:            Co-evaluation              AM-PAC OT "6 Clicks" Daily Activity     Outcome Measure Help from another person eating meals?: None Help from another person taking care of personal grooming?: A Little Help from another person toileting, which includes using toliet, bedpan, or urinal?: A Little Help from another person bathing (including washing, rinsing, drying)?: None Help from another person to put on and taking off regular upper body clothing?: None Help from another person to put on and taking off regular lower body clothing?: A Little 6 Click Score: 21   End of Session Equipment Utilized During Treatment: Gait belt;Rolling  walker (2 wheels)  Activity Tolerance: Patient tolerated treatment well Patient left: with nursing/sitter in room;in chair  OT Visit Diagnosis: Unsteadiness on feet (R26.81);Muscle weakness (generalized) (M62.81)                Time: 4237-0230 OT Time Calculation (min): 26 min Charges:  OT General Charges $OT Visit: 1 Visit OT Evaluation $OT Eval Moderate Complexity: 1 Mod OT Treatments $Self Care/Home Management : 8-22 mins  Nino Glow, Markus Daft 11/06/2020, 1:55 PM

## 2020-11-07 LAB — BASIC METABOLIC PANEL
Anion gap: 12 (ref 5–15)
BUN: 67 mg/dL — ABNORMAL HIGH (ref 8–23)
CO2: 33 mmol/L — ABNORMAL HIGH (ref 22–32)
Calcium: 8.6 mg/dL — ABNORMAL LOW (ref 8.9–10.3)
Chloride: 93 mmol/L — ABNORMAL LOW (ref 98–111)
Creatinine, Ser: 2.25 mg/dL — ABNORMAL HIGH (ref 0.44–1.00)
GFR, Estimated: 23 mL/min — ABNORMAL LOW (ref >60)
Glucose, Bld: 110 mg/dL — ABNORMAL HIGH (ref 70–99)
Potassium: 3.7 mmol/L (ref 3.5–5.1)
Sodium: 138 mmol/L (ref 135–145)

## 2020-11-07 LAB — BRAIN NATRIURETIC PEPTIDE: B Natriuretic Peptide: 2479.7 pg/mL — ABNORMAL HIGH (ref 0.0–100.0)

## 2020-11-07 MED ORDER — MIDODRINE HCL 5 MG PO TABS: 5.0000 mg | ORAL_TABLET | Freq: Three times a day (TID) | ORAL | 0 refills | Status: DC

## 2020-11-07 MED ORDER — OSELTAMIVIR PHOSPHATE 30 MG PO CAPS: 30.0000 mg | ORAL_CAPSULE | Freq: Every day | ORAL | 0 refills | Status: AC

## 2020-11-07 MED ORDER — LEVOTHYROXINE SODIUM 100 MCG PO TABS: 100.0000 ug | ORAL_TABLET | Freq: Every day | ORAL | 0 refills | Status: DC

## 2020-11-07 MED ORDER — CARVEDILOL 3.125 MG PO TABS: 3.1250 mg | ORAL_TABLET | Freq: Two times a day (BID) | ORAL | 0 refills | Status: DC

## 2020-11-07 MED ORDER — PREDNISONE 20 MG PO TABS: 40.0000 mg | ORAL_TABLET | Freq: Every day | ORAL | 0 refills | Status: AC

## 2020-11-07 NOTE — TOC Transition Note (Addendum)
Transition of Care [facility]) - CM/SW Discharge Note   Patient Details  Name: Maria Neal MRN: [id number] Date of Birth: 1950-08-05  [facility] CM/SW Contact:  Izola Price, RN Phone Number: 11/07/2020, 10:30 AM   Clinical Narrative: Patient to discharge to home with [facility] services of PT/RN and a BSC or 3:1 per OT recommendations to be delivered to daughter's home address where patient will be residing for a period of time post discharge. Patient has a shower seat at home. [facility] notified of DC. Daughter's address is [place], [place]. Daughter will transport a patient home. No other concerns or issues with discharge plan voiced at this time. Was unable to reach patient directly. Daughter said patient was on chronic oxygen at home via [employer] and has a transport condenser to bring for transport home. Simmie Davies RN CM       Final next level of care: [place] Barriers to Discharge: Barriers Resolved   Patient Goals and CMS Choice        Discharge Placement                       Discharge Plan and Services                  DME Agency: [employer] Date DME Agency Contacted: 11/07/20 Time DME Agency Contacted: [id number] Representative spoke with at DME Agency: Delana Meyer (Will deliver to Daughter's address) [place] Arranged: PT, RN [place] Agency: [place] ([place]) Date [place]: 11/07/20 Time [place]: [phone] Representative spoke with at [place]: Floydene Flock notified  Social Determinants of Health ([place]) Interventions     Readmission Risk Interventions No flowsheet data found.

## 2020-11-07 NOTE — Discharge Summary (Signed)
Physician Discharge Summary  Maria Neal SAY:[id number] DOB: Mar 26, 1950 DOA: 11/04/2020  PCP: Jon Billings, NP  Admit date: 11/04/2020 Discharge date: 11/07/2020  Admitted From: Home Disposition:  Home  Discharge Condition:Stable CODE STATUS:FULL Diet recommendation: Heart Healthy  Brief/Interim Summary:  Patient is a 70 year old female with history of chronic A. fib, chronic systolic heart failure with EF of 25% status post ICD placement, severe COPD, chronic hypoxic respiratory failure on 3 L of oxygen at home, CKD stage IIIb with baseline creatinine of 2.1 who presented to the ER from PCPs office after she presented with fever, chills, body aches, shortness of breath.  In the clinic she was noted to be hypoxic with oxygen saturation of 76% and was placed on 5 L of oxygen and sent to ER.  She saturated 49% on room air on presentation and was initially placed on 15 L of oxygen then weaned to 4 L.  COVID PCR was negative.  Influenza A was positive.  Chest x-ray showed pulm edema.  Patient was admitted for the management of influenza, CHF exacerbation.  Started on Tamiflu, Lasix.  Currently her respiratory status is stable and she is maintaining her saturation on her usual 3 L of oxygen per minute.  PT recommended home health on discharge.  She is medically stable for discharge today.  Following problems were addressed during her hospitalization:  Influenza A/acute on chronic hypoxic respiratory failure: Severely hypoxic on presentation.  Presented with body aches, shortness of breath, fever, chills. Started on Tamiflu.  Currently on 3 L of oxygen per minute. On 3 L of oxygen per minute at home.  Feels much better today.   Acute on chronic systolic congestive heart failure: Known EF of 25% as per echo done on 08/2020, status post ICD placement.  Looked volume overloaded on presentation with elevated BNP, chest ray showed pulmonary edema.  Started on Lasix 40 mg IV every 12 hr. she takes  torsemide 20 mg once a week.  We recommend to follow-up with her cardiologist in a week  Chronic A. fib: On Eliquis, Coreg, digoxin, amiodarone.  Currently rate is controlled.   Hypertension: Currently blood pressure soft.  Dose of carvedilol decreased.  Started on midodrine   History of history of COPD: Found to be wheezing on 11/05/20, started on prednisone.  Currently stable  Hypothyroidism/abnormal TSH: This is found to be severely elevated but T4 is normal.  Takes 75 mcg levothyroxine at home.  Increase the dose of levothyroxine 200 mcg.  We recommend to do a thyroid function test in 4 weeks   CKD stage IIIb: Baseline creatinine around 2.1.  Creatinine slightly up from baseline.   History of chronic venous insufficiency: Stable   Debility/generalized weakness: Patient ambulates with help of walker.  PT recommended home health on discharge   Discharge Diagnoses:  Principal Problem:   Influenza A Active Problems:   Benign essential HTN   COPD, severe (HCC)   Chronic venous insufficiency   Chronic kidney disease, stage 3a (HCC)   Current use of long term anticoagulation   Acute on chronic systolic CHF (congestive heart failure) (HCC)   Acute on chronic respiratory failure with hypoxia (HCC) - home O2 @ 3 L/min   Chronic a-fib ([place])    Discharge Instructions  Discharge Instructions     Diet - low sodium heart healthy   Complete by: As directed    Discharge instructions   Complete by: As directed    1) Please take prescribed medications as instructed  2)Follow up with your PCP and cardiologist in 1 to 2 weeks. 3)Do your thyroid function test in 4 weeks   Increase activity slowly   Complete by: As directed       Allergies as of 11/07/2020       Reactions   Levaquin [levofloxacin] Anaphylaxis   Amoxicillin Hives   Did it involve swelling of the face/tongue/throat, SOB, or low BP? No Did it involve sudden or severe rash/hives, skin peeling, or any reaction on the  inside of your mouth or nose? No Did you need to seek medical attention at a hospital or doctor's office? No When did it last happen?      10+ years If all above answers are "NO", may proceed with cephalosporin use.   Nitrofuran Derivatives Other (See Comments)   Unknown   Penicillins Other (See Comments)   Thinks it made her itch a lot, but isn't sure. Did it involve swelling of the face/tongue/throat, SOB, or low BP? No Did it involve sudden or severe rash/hives, skin peeling, or any reaction on the inside of your mouth or nose? No Did you need to seek medical attention at a hospital or doctor's office? No When did it last happen?      10+ years If all above answers are "NO", may proceed with cephalosporin use.   Zithromax [azithromycin] Other (See Comments)   Antihistamines, Chlorpheniramine-type Rash        Medication List     STOP taking these medications    cyclobenzaprine 5 MG tablet Commonly known as: FLEXERIL       TAKE these medications    albuterol 108 (90 Base) MCG/ACT inhaler Commonly known as: VENTOLIN HFA Inhale 2 puffs into the lungs every 6 (six) hours as needed for wheezing or shortness of breath.   alum & mag hydroxide-simeth 200-200-20 MG/5ML suspension Commonly known as: MAALOX/MYLANTA Take 30 mLs by mouth every 4 (four) hours as needed for indigestion or heartburn.   amiodarone 200 MG tablet Commonly known as: PACERONE Take 1 tablet (200 mg total) by mouth daily.   apixaban 5 MG Tabs tablet Commonly known as: ELIQUIS Take 1 tablet (5 mg total) by mouth 2 (two) times daily.   carvedilol 3.125 MG tablet Commonly known as: COREG Take 1 tablet (3.125 mg total) by mouth 2 (two) times daily. What changed:  medication strength how much to take   clotrimazole-betamethasone cream Commonly known as: LOTRISONE Apply 1 application topically daily.   Digoxin 62.5 MCG Tabs Take 0.0625 mg by mouth daily.   fluticasone 50 MCG/ACT nasal  spray Commonly known as: FLONASE Place 2 sprays into both nostrils daily.   gabapentin 100 MG capsule Commonly known as: NEURONTIN Take 1 capsule (100 mg total) by mouth at bedtime.   levothyroxine 100 MCG tablet Commonly known as: SYNTHROID Take 1 tablet (100 mcg total) by mouth daily. Start taking on: November 08, 2020 What changed:  medication strength how much to take   midodrine 5 MG tablet Commonly known as: PROAMATINE Take 1 tablet (5 mg total) by mouth 3 (three) times daily with meals.   nystatin cream Commonly known as: MYCOSTATIN Apply 1 application topically 2 (two) times daily.   omeprazole 20 MG capsule Commonly known as: PRILOSEC TAKE 1 CAPSULE BY MOUTH ONCE DAILY   oseltamivir 30 MG capsule Commonly known as: TAMIFLU Take 1 capsule (30 mg total) by mouth daily for 2 days.   OXYGEN Inhale 3 L into the lungs 3 (three) times daily  as needed (shortness of breath or coughing).   predniSONE 20 MG tablet Commonly known as: DELTASONE Take 2 tablets (40 mg total) by mouth daily with breakfast for 2 days.   spironolactone 25 MG tablet Commonly known as: ALDACTONE Take 12.5 mg by mouth daily.   torsemide 20 MG tablet Commonly known as: DEMADEX Take 1 tablet (20 mg total) by mouth once a week.   Trelegy Ellipta 100-62.5-25 MCG/ACT Aepb Generic drug: Fluticasone-Umeclidin-Vilant Inhale 1 puff into the lungs daily.        Follow-up Information     Jon Billings, NP. Schedule an appointment as soon as possible for a visit in 1 week(s).   Specialty: Nurse Practitioner Contact information: [place] [address] [phone]                Allergies  Allergen Reactions   Levaquin [Levofloxacin] Anaphylaxis   Amoxicillin Hives    Did it involve swelling of the face/tongue/throat, SOB, or low BP? No Did it involve sudden or severe rash/hives, skin peeling, or any reaction on the inside of your mouth or nose? No Did you need to  seek medical attention at a hospital or doctor's office? No When did it last happen?      10+ years If all above answers are "NO", may proceed with cephalosporin use.   Nitrofuran Derivatives Other (See Comments)    Unknown   Penicillins Other (See Comments)    Thinks it made her itch a lot, but isn't sure. Did it involve swelling of the face/tongue/throat, SOB, or low BP? No Did it involve sudden or severe rash/hives, skin peeling, or any reaction on the inside of your mouth or nose? No Did you need to seek medical attention at a hospital or doctor's office? No When did it last happen?      10+ years If all above answers are "NO", may proceed with cephalosporin use.    Zithromax [Azithromycin] Other (See Comments)   Antihistamines, Chlorpheniramine-Type Rash    Consultations: None   Procedures/Studies: DG Chest 2 View  Result Date: 11/04/2020 CLINICAL DATA:  hypoxia, SHOB EXAM: CHEST - 2 VIEW COMPARISON:  08/07/2020. FINDINGS: Marked enlargement the cardiac silhouette. Left subclavian approach cardiac rhythm maintenance device. Calcific atherosclerosis of the aorta. Increased perihilar opacities bilaterally. IMPRESSION: 1. Increased perihilar opacities bilaterally, probably edema. Infection is not excluded. 2. Marked cardiomegaly. Electronically Signed   By: Margaretha Sheffield M.D.   On: 11/04/2020 10:46      Subjective: Patient seen and examined at the bedside this morning.  Hemodynamically stable for discharge today.  Discharge Exam: Vitals:   11/07/20 0722 11/07/20 0831  BP:  (!) 105/93  Pulse:  74  Resp:  20  Temp:  97.6 F (36.4 C)  SpO2: 98% 96%   Vitals:   11/07/20 0500 11/07/20 0548 11/07/20 0722 11/07/20 0831  BP:  (!) 107/46  (!) 105/93  Pulse:  76  74  Resp:  18  20  Temp:  (!) 97.4 F (36.3 C)  97.6 F (36.4 C)  TempSrc:    Oral  SpO2:  100% 98% 96%  Weight: 77.6 kg     Height:        General: Pt is alert, awake, not in acute  distress Cardiovascular: RRR, S1/S2 +, no rubs, no gallops Respiratory: CTA bilaterally, no wheezing, no rhonchi Abdominal: Soft, NT, ND, bowel sounds + Extremities: no edema, no cyanosis    The results of significant diagnostics from this  hospitalization (including imaging, microbiology, ancillary and laboratory) are listed below for reference.     Microbiology: Recent Results (from the past 240 hour(s))  Resp Panel by RT-PCR (Flu A&B, Covid) Nasopharyngeal Swab     Status: Abnormal   Collection Time: 11/04/20 11:09 AM   Specimen: Nasopharyngeal Swab; Nasopharyngeal(NP) swabs in vial transport medium  Result Value Ref Range Status   SARS Coronavirus 2 by RT PCR NEGATIVE NEGATIVE Final    Comment: (NOTE) SARS-CoV-2 target nucleic acids are NOT DETECTED.  The SARS-CoV-2 RNA is generally detectable in upper respiratory specimens during the acute phase of infection. The lowest concentration of SARS-CoV-2 viral copies this assay can detect is 138 copies/mL. A negative result does not preclude SARS-Cov-2 infection and should not be used as the sole basis for treatment or other patient management decisions. A negative result may occur with  improper specimen collection/handling, submission of specimen other than nasopharyngeal swab, presence of viral mutation(s) within the areas targeted by this assay, and inadequate number of viral copies(<138 copies/mL). A negative result must be combined with clinical observations, patient history, and epidemiological information. The expected result is Negative.  Fact Sheet for Patients:  EntrepreneurPulse.com.au  Fact Sheet for Healthcare Providers:  IncredibleEmployment.be  This test is no t yet approved or cleared by the [place] FDA and  has been authorized for detection and/or diagnosis of SARS-CoV-2 by FDA under an Emergency Use Authorization (EUA). This EUA will remain  in effect (meaning this  test can be used) for the duration of the COVID-19 declaration under Section 564(b)(1) of the Act, 21 U.S.C.section 360bbb-3(b)(1), unless the authorization is terminated  or revoked sooner.       Influenza A by PCR POSITIVE (A) NEGATIVE Final   Influenza B by PCR NEGATIVE NEGATIVE Final    Comment: (NOTE) The Xpert Xpress SARS-CoV-2/FLU/RSV plus assay is intended as an aid in the diagnosis of influenza from Nasopharyngeal swab specimens and should not be used as a sole basis for treatment. Nasal washings and aspirates are unacceptable for Xpert Xpress SARS-CoV-2/FLU/RSV testing.  Fact Sheet for Patients: EntrepreneurPulse.com.au  Fact Sheet for Healthcare Providers: IncredibleEmployment.be  This test is not yet approved or cleared by the [place] FDA and has been authorized for detection and/or diagnosis of SARS-CoV-2 by FDA under an Emergency Use Authorization (EUA). This EUA will remain in effect (meaning this test can be used) for the duration of the COVID-19 declaration under Section 564(b)(1) of the Act, 21 U.S.C. section 360bbb-3(b)(1), unless the authorization is terminated or revoked.  Performed at [facility], [address] [place], [place] [address]      Labs: BNP (last 3 results) Recent Labs    08/07/20 1344 11/04/20 0956 11/07/20 0445  BNP 1,647.8* 2,657.1* 6,045.4*   Basic Metabolic Panel: Recent Labs  Lab 11/04/20 0956 11/05/20 0756 11/06/20 0505 11/07/20 0445  NA 134* 137 136 138  K 4.8 4.0 4.2 3.7  CL 89* 90* 89* 93*  CO2 34* 36* 37* 33*  GLUCOSE 101* 81 127* 110*  BUN 65* 66* 70* 67*  CREATININE 2.46* 2.46* 2.39* 2.25*  CALCIUM 8.9 9.0 8.9 8.6*  MG  --  2.3  --   --    Liver Function Tests: Recent Labs  Lab 11/05/20 0756  AST 22  ALT 14  ALKPHOS 55  BILITOT 1.6*  PROT 7.2  ALBUMIN 3.6   No results for input(s): LIPASE, AMYLASE in the last 168 hours. No results for input(s):  AMMONIA in  the last 168 hours. CBC: Recent Labs  Lab 11/04/20 0956 11/05/20 0756  WBC 4.6 3.9*  NEUTROABS  --  2.2  HGB 10.1* 9.5*  HCT 33.0* 31.6*  MCV 93.8 94.6  PLT 128* 122*   Cardiac Enzymes: No results for input(s): CKTOTAL, CKMB, CKMBINDEX, TROPONINI in the last 168 hours. BNP: Invalid input(s): POCBNP CBG: Recent Labs  Lab 11/05/20 0729 11/05/20 1154  GLUCAP 77 157*   D-Dimer No results for input(s): DDIMER in the last 72 hours. Hgb A1c No results for input(s): HGBA1C in the last 72 hours. Lipid Profile No results for input(s): CHOL, HDL, LDLCALC, TRIG, CHOLHDL, LDLDIRECT in the last 72 hours. Thyroid function studies Recent Labs    11/04/20 1218  TSH 117.618*   Anemia work up No results for input(s): VITAMINB12, FOLATE, FERRITIN, TIBC, IRON, RETICCTPCT in the last 72 hours. Urinalysis    Component Value Date/Time   COLORURINE YELLOW (A) 10/06/2018 1737   APPEARANCEUR Cloudy (A) 09/23/2020 1100   LABSPEC 1.012 10/06/2018 1737   LABSPEC 1.021 09/21/2012 1516   PHURINE 6.0 10/06/2018 1737   GLUCOSEU Negative 09/23/2020 1100   GLUCOSEU Negative 09/21/2012 1516   HGBUR NEGATIVE 10/06/2018 1737   BILIRUBINUR Negative 09/23/2020 1100   BILIRUBINUR Negative 09/21/2012 1516   KETONESUR NEGATIVE 10/06/2018 1737   PROTEINUR 1+ (A) 09/23/2020 1100   PROTEINUR NEGATIVE 10/06/2018 1737   NITRITE Negative 09/23/2020 1100   NITRITE NEGATIVE 10/06/2018 1737   LEUKOCYTESUR 1+ (A) 09/23/2020 1100   LEUKOCYTESUR TRACE (A) 10/06/2018 1737   LEUKOCYTESUR Negative 09/21/2012 1516   Sepsis Labs Invalid input(s): PROCALCITONIN,  WBC,  LACTICIDVEN Microbiology Recent Results (from the past 240 hour(s))  Resp Panel by RT-PCR (Flu A&B, Covid) Nasopharyngeal Swab     Status: Abnormal   Collection Time: 11/04/20 11:09 AM   Specimen: Nasopharyngeal Swab; Nasopharyngeal(NP) swabs in vial transport medium  Result Value Ref Range Status   SARS Coronavirus 2 by RT PCR  NEGATIVE NEGATIVE Final    Comment: (NOTE) SARS-CoV-2 target nucleic acids are NOT DETECTED.  The SARS-CoV-2 RNA is generally detectable in upper respiratory specimens during the acute phase of infection. The lowest concentration of SARS-CoV-2 viral copies this assay can detect is 138 copies/mL. A negative result does not preclude SARS-Cov-2 infection and should not be used as the sole basis for treatment or other patient management decisions. A negative result may occur with  improper specimen collection/handling, submission of specimen other than nasopharyngeal swab, presence of viral mutation(s) within the areas targeted by this assay, and inadequate number of viral copies(<138 copies/mL). A negative result must be combined with clinical observations, patient history, and epidemiological information. The expected result is Negative.  Fact Sheet for Patients:  EntrepreneurPulse.com.au  Fact Sheet for Healthcare Providers:  IncredibleEmployment.be  This test is no t yet approved or cleared by the [place] FDA and  has been authorized for detection and/or diagnosis of SARS-CoV-2 by FDA under an Emergency Use Authorization (EUA). This EUA will remain  in effect (meaning this test can be used) for the duration of the COVID-19 declaration under Section 564(b)(1) of the Act, 21 U.S.C.section 360bbb-3(b)(1), unless the authorization is terminated  or revoked sooner.       Influenza A by PCR POSITIVE (A) NEGATIVE Final   Influenza B by PCR NEGATIVE NEGATIVE Final    Comment: (NOTE) The Xpert Xpress SARS-CoV-2/FLU/RSV plus assay is intended as an aid in the diagnosis of influenza from Nasopharyngeal swab specimens and should not be used  as a sole basis for treatment. Nasal washings and aspirates are unacceptable for Xpert Xpress SARS-CoV-2/FLU/RSV testing.  Fact Sheet for Patients: EntrepreneurPulse.com.au  Fact Sheet for  Healthcare Providers: IncredibleEmployment.be  This test is not yet approved or cleared by the [place] FDA and has been authorized for detection and/or diagnosis of SARS-CoV-2 by FDA under an Emergency Use Authorization (EUA). This EUA will remain in effect (meaning this test can be used) for the duration of the COVID-19 declaration under Section 564(b)(1) of the Act, 21 U.S.C. section 360bbb-3(b)(1), unless the authorization is terminated or revoked.  Performed at [facility], [address], [place], [place] [address]     Please note: You were cared for by a hospitalist during your hospital stay. Once you are discharged, your primary care physician will handle any further medical issues. Please note that NO REFILLS for any discharge medications will be authorized once you are discharged, as it is imperative that you return to your primary care physician (or establish a relationship with a primary care physician if you do not have one) for your post hospital discharge needs so that they can reassess your need for medications and monitor your lab values.    Time coordinating discharge: 40 minutes  SIGNED:   Shelly Coss, MD  [facility] 11/07/2020, 9:36 AM Pager [phone]  If 7PM-7AM, please contact night-coverage [email or web address] Password [email or web address]

## 2020-11-07 NOTE — Progress Notes (Signed)
Patient is being discharged home with daughter and [facility]. Ivs have been removed. Cope of discharge instructions given, patient educated on discharge instructions. Daughter will bring patietn home O2 for pick up and transportation home.

## 2020-11-08 ENCOUNTER — Other Ambulatory Visit: Payer: Self-pay

## 2020-11-08 ENCOUNTER — Telehealth: Payer: Self-pay | Admitting: *Deleted

## 2020-11-08 DIAGNOSIS — I5023 Acute on chronic systolic (congestive) heart failure: Secondary | ICD-10-CM

## 2020-11-08 DIAGNOSIS — J449 Chronic obstructive pulmonary disease, unspecified: Secondary | ICD-10-CM | POA: Diagnosis not present

## 2020-11-08 DIAGNOSIS — J9690 Respiratory failure, unspecified, unspecified whether with hypoxia or hypercapnia: Secondary | ICD-10-CM | POA: Diagnosis not present

## 2020-11-08 NOTE — Telephone Encounter (Signed)
Transition Care Management Follow-up Telephone Call Date of discharge and from where: [facility]  11-07-2020 How have you been since you were released from the hospital? Still feeling week and tired Any questions or concerns? No  Items Reviewed: Did the pt receive and understand the discharge instructions provided? Yes  Medications obtained and verified? Yes  Other? Daughter is waiting on phone call from Cardiologist about medication change Any new allergies since your discharge? No  Dietary orders reviewed? No Do you have support at home? Yes   Home Care and Equipment/Supplies: Were home health services ordered? no If so, what is the name of the agency?   Has the agency set up a time to come to the patient's home? not applicable Were any new equipment or medical supplies ordered?  No What is the name of the medical supply agency?  Were you able to get the supplies/equipment? not applicable Do you have any questions related to the use of the equipment or supplies? No  Functional Questionnaire: (I = Independent and D = Dependent) ADLs: D  Bathing/Dressing- D  Meal Prep- D  Eating- I  Maintaining continence- I  Transferring/Ambulation- D  Managing Meds- D  Follow up appointments reviewed:  PCP Hospital f/u appt confirmed? No  Scheduled to see daughter is going to call to schedule today [facility] f/u appt confirmed?   Are transportation arrangements needed? No  If their condition worsens, is the pt aware to call PCP or go to the Emergency Dept.? Yes Was the patient provided with contact information for the PCP's office or ED? Yes Was to pt encouraged to call back with questions or concerns? Yes

## 2020-11-11 ENCOUNTER — Ambulatory Visit (INDEPENDENT_AMBULATORY_CARE_PROVIDER_SITE_OTHER): Payer: Medicare Other | Admitting: Nurse Practitioner

## 2020-11-11 ENCOUNTER — Other Ambulatory Visit: Payer: Self-pay

## 2020-11-11 ENCOUNTER — Encounter: Payer: Self-pay | Admitting: Nurse Practitioner

## 2020-11-11 VITALS — BP 114/54 | HR 72 | Temp 97.0°F | Ht 64.0 in | Wt 169.0 lb

## 2020-11-11 DIAGNOSIS — Z87891 Personal history of nicotine dependence: Secondary | ICD-10-CM | POA: Diagnosis not present

## 2020-11-11 DIAGNOSIS — J441 Chronic obstructive pulmonary disease with (acute) exacerbation: Secondary | ICD-10-CM | POA: Diagnosis not present

## 2020-11-11 DIAGNOSIS — N200 Calculus of kidney: Secondary | ICD-10-CM | POA: Diagnosis not present

## 2020-11-11 DIAGNOSIS — Z791 Long term (current) use of non-steroidal anti-inflammatories (NSAID): Secondary | ICD-10-CM | POA: Diagnosis not present

## 2020-11-11 DIAGNOSIS — N1831 Chronic kidney disease, stage 3a: Secondary | ICD-10-CM

## 2020-11-11 DIAGNOSIS — Z9181 History of falling: Secondary | ICD-10-CM | POA: Diagnosis not present

## 2020-11-11 DIAGNOSIS — F32A Depression, unspecified: Secondary | ICD-10-CM | POA: Diagnosis not present

## 2020-11-11 DIAGNOSIS — J101 Influenza due to other identified influenza virus with other respiratory manifestations: Secondary | ICD-10-CM

## 2020-11-11 DIAGNOSIS — E039 Hypothyroidism, unspecified: Secondary | ICD-10-CM

## 2020-11-11 DIAGNOSIS — Z09 Encounter for follow-up examination after completed treatment for conditions other than malignant neoplasm: Secondary | ICD-10-CM

## 2020-11-11 DIAGNOSIS — I5022 Chronic systolic (congestive) heart failure: Secondary | ICD-10-CM | POA: Diagnosis not present

## 2020-11-11 DIAGNOSIS — R319 Hematuria, unspecified: Secondary | ICD-10-CM

## 2020-11-11 DIAGNOSIS — Z9981 Dependence on supplemental oxygen: Secondary | ICD-10-CM | POA: Diagnosis not present

## 2020-11-11 DIAGNOSIS — Z9889 Other specified postprocedural states: Secondary | ICD-10-CM | POA: Diagnosis not present

## 2020-11-11 DIAGNOSIS — Z7901 Long term (current) use of anticoagulants: Secondary | ICD-10-CM | POA: Diagnosis not present

## 2020-11-11 DIAGNOSIS — E785 Hyperlipidemia, unspecified: Secondary | ICD-10-CM | POA: Diagnosis not present

## 2020-11-11 DIAGNOSIS — I130 Hypertensive heart and chronic kidney disease with heart failure and stage 1 through stage 4 chronic kidney disease, or unspecified chronic kidney disease: Secondary | ICD-10-CM | POA: Diagnosis not present

## 2020-11-11 DIAGNOSIS — R0602 Shortness of breath: Secondary | ICD-10-CM | POA: Diagnosis not present

## 2020-11-11 DIAGNOSIS — G2581 Restless legs syndrome: Secondary | ICD-10-CM | POA: Diagnosis not present

## 2020-11-11 DIAGNOSIS — J9620 Acute and chronic respiratory failure, unspecified whether with hypoxia or hypercapnia: Secondary | ICD-10-CM | POA: Diagnosis not present

## 2020-11-11 DIAGNOSIS — I4729 Other ventricular tachycardia: Secondary | ICD-10-CM | POA: Diagnosis not present

## 2020-11-11 DIAGNOSIS — I2510 Atherosclerotic heart disease of native coronary artery without angina pectoris: Secondary | ICD-10-CM | POA: Diagnosis not present

## 2020-11-11 DIAGNOSIS — I10 Essential (primary) hypertension: Secondary | ICD-10-CM | POA: Diagnosis not present

## 2020-11-11 DIAGNOSIS — J410 Simple chronic bronchitis: Secondary | ICD-10-CM | POA: Diagnosis not present

## 2020-11-11 DIAGNOSIS — I872 Venous insufficiency (chronic) (peripheral): Secondary | ICD-10-CM | POA: Diagnosis not present

## 2020-11-11 DIAGNOSIS — I447 Left bundle-branch block, unspecified: Secondary | ICD-10-CM | POA: Diagnosis not present

## 2020-11-11 DIAGNOSIS — Z7951 Long term (current) use of inhaled steroids: Secondary | ICD-10-CM | POA: Diagnosis not present

## 2020-11-11 DIAGNOSIS — J449 Chronic obstructive pulmonary disease, unspecified: Secondary | ICD-10-CM | POA: Diagnosis not present

## 2020-11-11 DIAGNOSIS — I081 Rheumatic disorders of both mitral and tricuspid valves: Secondary | ICD-10-CM | POA: Diagnosis not present

## 2020-11-11 DIAGNOSIS — I4819 Other persistent atrial fibrillation: Secondary | ICD-10-CM | POA: Diagnosis not present

## 2020-11-11 DIAGNOSIS — Z95810 Presence of automatic (implantable) cardiac defibrillator: Secondary | ICD-10-CM | POA: Diagnosis not present

## 2020-11-11 DIAGNOSIS — I420 Dilated cardiomyopathy: Secondary | ICD-10-CM | POA: Diagnosis not present

## 2020-11-11 DIAGNOSIS — I5043 Acute on chronic combined systolic (congestive) and diastolic (congestive) heart failure: Secondary | ICD-10-CM | POA: Diagnosis not present

## 2020-11-11 DIAGNOSIS — D500 Iron deficiency anemia secondary to blood loss (chronic): Secondary | ICD-10-CM | POA: Diagnosis not present

## 2020-11-11 DIAGNOSIS — G4700 Insomnia, unspecified: Secondary | ICD-10-CM | POA: Diagnosis not present

## 2020-11-11 LAB — URINALYSIS, ROUTINE W REFLEX MICROSCOPIC
Bilirubin, UA: NEGATIVE
Glucose, UA: NEGATIVE
Ketones, UA: NEGATIVE
Nitrite, UA: NEGATIVE
Protein,UA: NEGATIVE
RBC, UA: NEGATIVE
Specific Gravity, UA: 1.01 (ref 1.005–1.030)
Urobilinogen, Ur: 1 mg/dL (ref 0.2–1.0)
pH, UA: 5.5 (ref 5.0–7.5)

## 2020-11-11 LAB — MICROSCOPIC EXAMINATION
Bacteria, UA: NONE SEEN
RBC, Urine: NONE SEEN /hpf (ref 0–2)

## 2020-11-11 MED ORDER — LEVOTHYROXINE SODIUM 100 MCG PO TABS: 100.0000 ug | ORAL_TABLET | Freq: Every day | ORAL | 1 refills | Status: DC

## 2020-11-11 NOTE — Progress Notes (Signed)
BP (!) 114/54   Pulse 72   Temp (!) 97 F (36.1 C) (Oral)   Ht _0  (1.626 m)   Wt 169 lb (76.7 kg)   SpO2 90%   BMI 29.01 kg/m    Subjective:    Patient ID: Maria Neal, female    DOB: Nov 23, 1950, 70 y.o.   MRN: [id number]  HPI: Maria Neal is a 70 y.o. female  Chief Complaint  Patient presents with   Hospitalization Follow-up    Admitted to [facility] for 3 days- diagnosed with Flu A and COPD exacerbation   Transition of [facility] Follow up.   Hospital/Facility: [facility] D/C Physician: Dr. Leo Rod D/C Date: 11/07/2020  Records Requested: NA Records Received: Yes Records Reviewed: Yes  Diagnoses on Discharge:   Acute on chronic systolic congestive heart failure: Known EF of 25% as per echo done on 08/2020, status post ICD placement.  Looked volume overloaded on presentation with elevated BNP, chest ray showed pulmonary edema.  Started on Lasix 40 mg IV every 12 hr. she takes torsemide 20 mg once a week.  We recommend to follow-up with her cardiologist in a week   Chronic A. fib: On Eliquis, Coreg, digoxin, amiodarone.  Currently rate is controlled.   Hypertension: Currently blood pressure soft.  Dose of carvedilol decreased.  Started on midodrine   History of history of COPD: Found to be wheezing on 11/05/20, started on prednisone.  Currently stable   Hypothyroidism/abnormal TSH: This is found to be severely elevated but T4 is normal.  Takes 75 mcg levothyroxine at home.  Increase the dose of levothyroxine 200 mcg.  We recommend to do a thyroid function test in 4 weeks   CKD stage IIIb: Baseline creatinine around 2.1.  Creatinine slightly up from baseline.   History of chronic venous insufficiency: Stable   Debility/generalized weakness: Patient ambulates with help of walker.  PT recommended home health on discharge  Date of interactive Contact within 48 hours of discharge:  Contact was through: phone  Date of 7 day or 14 day face-to-face visit:    within 7  days  Outpatient Encounter Medications as of 11/11/2020  Medication Sig   albuterol (PROVENTIL HFA;VENTOLIN HFA) 108 (90 Base) MCG/ACT inhaler Inhale 2 puffs into the lungs every 6 (six) hours as needed for wheezing or shortness of breath.   alum & mag hydroxide-simeth (MAALOX/MYLANTA) 200-200-20 MG/5ML suspension Take 30 mLs by mouth every 4 (four) hours as needed for indigestion or heartburn.   amiodarone (PACERONE) 200 MG tablet Take 1 tablet (200 mg total) by mouth daily.   apixaban (ELIQUIS) 5 MG TABS tablet Take 1 tablet (5 mg total) by mouth 2 (two) times daily.   carvedilol (COREG) 3.125 MG tablet Take 1 tablet (3.125 mg total) by mouth 2 (two) times daily.   clotrimazole-betamethasone (LOTRISONE) cream Apply 1 application topically daily.   digoxin 62.5 MCG TABS Take 0.0625 mg by mouth daily.   fluticasone (FLONASE) 50 MCG/ACT nasal spray Place 2 sprays into both nostrils daily.   Fluticasone-Umeclidin-Vilant (TRELEGY ELLIPTA) 100-62.5-25 MCG/INH AEPB Inhale 1 puff into the lungs daily.   gabapentin (NEURONTIN) 100 MG capsule Take 1 capsule (100 mg total) by mouth at bedtime.   midodrine (PROAMATINE) 5 MG tablet Take 1 tablet (5 mg total) by mouth 3 (three) times daily with meals.   nystatin cream (MYCOSTATIN) Apply 1 application topically 2 (two) times daily.   omeprazole (PRILOSEC) 20 MG capsule TAKE 1 CAPSULE BY MOUTH ONCE DAILY  OXYGEN Inhale 3 L into the lungs 3 (three) times daily as needed (shortness of breath or coughing).   spironolactone (ALDACTONE) 25 MG tablet Take 12.5 mg by mouth daily.   torsemide (DEMADEX) 20 MG tablet Take 20 mg by mouth daily.   [DISCONTINUED] levothyroxine (SYNTHROID) 100 MCG tablet Take 1 tablet (100 mcg total) by mouth daily.   levothyroxine (SYNTHROID) 100 MCG tablet Take 1 tablet (100 mcg total) by mouth daily.   [DISCONTINUED] torsemide (DEMADEX) 20 MG tablet Take 1 tablet (20 mg total) by mouth once a week. (Patient not taking: No sig  reported)   No facility-administered encounter medications on file as of 11/11/2020.    Diagnostic Tests Reviewed/Disposition: Reviewed  Consults: Yes  Discharge Instructions Reviewed with patient  Disease/illness Education: Provided  Home Health/Community Services Discussions/Referrals: NA  Establishment or re-establishment of referral orders for community resources: NA  Discussion with other health care providers: No Assessment and Support of treatment regimen adherence: Discussed during visit  Appointments Coordinated with: NA  Education for self-management, independent living, and ADLs: Reviewed   In clinic today patient is still having some shortness of breath but improved from prior, fatigue, and states she is not sleeping well.  Wondering if any of her medications can be stopped. Thinks she is taking too many.     Relevant past medical, surgical, family and social history reviewed and updated as indicated. Interim medical history since our last visit reviewed. Allergies and medications reviewed and updated.  Review of Systems  Constitutional:  Positive for fatigue.  Respiratory:  Positive for shortness of breath.   Psychiatric/Behavioral:  Positive for sleep disturbance.    Per HPI unless specifically indicated above     Objective:    BP (!) 114/54   Pulse 72   Temp (!) 97 F (36.1 C) (Oral)   Ht _0  (1.626 m)   Wt 169 lb (76.7 kg)   SpO2 90%   BMI 29.01 kg/m   Wt Readings from Last 3 Encounters:  11/11/20 169 lb (76.7 kg)  11/07/20 171 lb 1.2 oz (77.6 kg)  10/14/20 168 lb 3.2 oz (76.3 kg)    Physical Exam Vitals and nursing note reviewed.  Constitutional:      General: She is not in acute distress.    Appearance: Normal appearance. She is normal weight. She is not ill-appearing, toxic-appearing or diaphoretic.  HENT:     Head: Normocephalic.     Right Ear: External ear normal.     Left Ear: External ear normal.     Nose: Nose normal.      Mouth/Throat:     Mouth: Mucous membranes are moist.     Pharynx: Oropharynx is clear.  Eyes:     General:        Right eye: No discharge.        Left eye: No discharge.     Extraocular Movements: Extraocular movements intact.     Conjunctiva/sclera: Conjunctivae normal.     Pupils: Pupils are equal, round, and reactive to light.  Cardiovascular:     Rate and Rhythm: Normal rate and regular rhythm.     Heart sounds: Murmur heard.  Pulmonary:     Effort: Pulmonary effort is normal. No respiratory distress.     Breath sounds: Decreased air movement present. No wheezing or rales.  Musculoskeletal:     Cervical back: Normal range of motion and neck supple.  Skin:    General: Skin is warm and dry.  Capillary Refill: Capillary refill takes less than 2 seconds.  Neurological:     General: No focal deficit present.     Mental Status: She is alert and oriented to person, place, and time. Mental status is at baseline.  Psychiatric:        Mood and Affect: Mood normal.        Behavior: Behavior normal.        Thought Content: Thought content normal.        Judgment: Judgment normal.    Results for orders placed or performed during the hospital encounter of 11/04/20  Resp Panel by RT-PCR (Flu A&B, Covid) Nasopharyngeal Swab   Specimen: Nasopharyngeal Swab; Nasopharyngeal(NP) swabs in vial transport medium  Result Value Ref Range   SARS Coronavirus 2 by RT PCR NEGATIVE NEGATIVE   Influenza A by PCR POSITIVE (A) NEGATIVE   Influenza B by PCR NEGATIVE NEGATIVE  Basic metabolic panel  Result Value Ref Range   Sodium 134 (L) 135 - 145 mmol/L   Potassium 4.8 3.5 - 5.1 mmol/L   Chloride 89 (L) 98 - 111 mmol/L   CO2 34 (H) 22 - 32 mmol/L   Glucose, Bld 101 (H) 70 - 99 mg/dL   BUN 65 (H) 8 - 23 mg/dL   Creatinine, Ser 2.46 (H) 0.44 - 1.00 mg/dL   Calcium 8.9 8.9 - 10.3 mg/dL   GFR, Estimated 21 (L) >60 mL/min   Anion gap 11 5 - 15  CBC  Result Value Ref Range   WBC 4.6 4.0 - 10.5  K/uL   RBC 3.52 (L) 3.87 - 5.11 MIL/uL   Hemoglobin 10.1 (L) 12.0 - 15.0 g/dL   HCT 33.0 (L) 36.0 - 46.0 %   MCV 93.8 80.0 - 100.0 fL   MCH 28.7 26.0 - 34.0 pg   MCHC 30.6 30.0 - 36.0 g/dL   RDW 20.4 (H) 11.5 - 15.5 %   Platelets 128 (L) 150 - 400 K/uL   nRBC 0.0 0.0 - 0.2 %  Blood gas, arterial  Result Value Ref Range   FIO2 0.36    Delivery systems NASAL CANNULA    pH, Arterial 7.45 7.350 - 7.450   pCO2 arterial 56 (H) 32.0 - 48.0 mmHg   pO2, Arterial 84 83.0 - 108.0 mmHg   Bicarbonate 38.9 (H) 20.0 - 28.0 mmol/L   Acid-Base Excess 13.0 (H) 0.0 - 2.0 mmol/L   O2 Saturation 96.7 %   Patient temperature 37.0    Collection site RIGHT RADIAL    Sample type ARTERIAL DRAW    Allens test (pass/fail) PASS PASS  Brain natriuretic peptide  Result Value Ref Range   B Natriuretic Peptide 2,657.1 (H) 0.0 - 100.0 pg/mL  TSH  Result Value Ref Range   TSH 117.618 (H) 0.350 - 4.500 uIU/mL  T4, free  Result Value Ref Range   Free T4 0.86 0.61 - 1.12 ng/dL  Digoxin level  Result Value Ref Range   Digoxin Level 2.3 (H) 0.8 - 2.0 ng/mL  CBC with Differential/Platelet  Result Value Ref Range   WBC 3.9 (L) 4.0 - 10.5 K/uL   RBC 3.34 (L) 3.87 - 5.11 MIL/uL   Hemoglobin 9.5 (L) 12.0 - 15.0 g/dL   HCT 31.6 (L) 36.0 - 46.0 %   MCV 94.6 80.0 - 100.0 fL   MCH 28.4 26.0 - 34.0 pg   MCHC 30.1 30.0 - 36.0 g/dL   RDW 20.3 (H) 11.5 - 15.5 %   Platelets 122 (L) 150 -  400 K/uL   nRBC 0.0 0.0 - 0.2 %   Neutrophils Relative % 54 %   Neutro Abs 2.2 1.7 - 7.7 K/uL   Lymphocytes Relative 29 %   Lymphs Abs 1.1 0.7 - 4.0 K/uL   Monocytes Relative 11 %   Monocytes Absolute 0.4 0.1 - 1.0 K/uL   Eosinophils Relative 4 %   Eosinophils Absolute 0.2 0.0 - 0.5 K/uL   Basophils Relative 1 %   Basophils Absolute 0.0 0.0 - 0.1 K/uL   Immature Granulocytes 1 %   Abs Immature Granulocytes 0.02 0.00 - 0.07 K/uL  Comprehensive metabolic panel  Result Value Ref Range   Sodium 137 135 - 145 mmol/L   Potassium  4.0 3.5 - 5.1 mmol/L   Chloride 90 (L) 98 - 111 mmol/L   CO2 36 (H) 22 - 32 mmol/L   Glucose, Bld 81 70 - 99 mg/dL   BUN 66 (H) 8 - 23 mg/dL   Creatinine, Ser 2.46 (H) 0.44 - 1.00 mg/dL   Calcium 9.0 8.9 - 10.3 mg/dL   Total Protein 7.2 6.5 - 8.1 g/dL   Albumin 3.6 3.5 - 5.0 g/dL   AST 22 15 - 41 U/L   ALT 14 0 - 44 U/L   Alkaline Phosphatase 55 38 - 126 U/L   Total Bilirubin 1.6 (H) 0.3 - 1.2 mg/dL   GFR, Estimated 21 (L) >60 mL/min   Anion gap 11 5 - 15  Magnesium  Result Value Ref Range   Magnesium 2.3 1.7 - 2.4 mg/dL  Glucose, capillary  Result Value Ref Range   Glucose-Capillary 77 70 - 99 mg/dL  Glucose, capillary  Result Value Ref Range   Glucose-Capillary 157 (H) 70 - 99 mg/dL  Basic metabolic panel  Result Value Ref Range   Sodium 136 135 - 145 mmol/L   Potassium 4.2 3.5 - 5.1 mmol/L   Chloride 89 (L) 98 - 111 mmol/L   CO2 37 (H) 22 - 32 mmol/L   Glucose, Bld 127 (H) 70 - 99 mg/dL   BUN 70 (H) 8 - 23 mg/dL   Creatinine, Ser 2.39 (H) 0.44 - 1.00 mg/dL   Calcium 8.9 8.9 - 10.3 mg/dL   GFR, Estimated 21 (L) >60 mL/min   Anion gap 10 5 - 15  Basic metabolic panel  Result Value Ref Range   Sodium 138 135 - 145 mmol/L   Potassium 3.7 3.5 - 5.1 mmol/L   Chloride 93 (L) 98 - 111 mmol/L   CO2 33 (H) 22 - 32 mmol/L   Glucose, Bld 110 (H) 70 - 99 mg/dL   BUN 67 (H) 8 - 23 mg/dL   Creatinine, Ser 2.25 (H) 0.44 - 1.00 mg/dL   Calcium 8.6 (L) 8.9 - 10.3 mg/dL   GFR, Estimated 23 (L) >60 mL/min   Anion gap 12 5 - 15  Brain natriuretic peptide  Result Value Ref Range   B Natriuretic Peptide 2,479.7 (H) 0.0 - 100.0 pg/mL  Troponin I (High Sensitivity)  Result Value Ref Range   Troponin I (High Sensitivity) 54 (H) <18 ng/L  Troponin I (High Sensitivity)  Result Value Ref Range   Troponin I (High Sensitivity) 54 (H) <18 ng/L      Assessment & Plan:   Problem List Items Addressed This Visit       Cardiovascular and Mediastinum   Chronic systolic congestive heart  failure, NYHA class 3 (HCC)    Chronic.  Exacerbated on presentation to the hospital secondary  to Influenza A. Patient was diuresed in the hospital due to pulmonary edema.  Has follow up with Cardiology today.       Relevant Medications   torsemide (DEMADEX) 20 MG tablet     Respiratory   COPD exacerbation (HCC)    Chronic. Exacerbated due to influenza A.  She was weaned to 3L O2 which is her baseline.  Patient's appearance in office improved from visit last week.  No cyanosis and has more energy. Patient has not been sleeping well.  Discussed that when she see's pulmonology to request a sleep study.  States the Gabapentin has not been working for her but she doesn't want to increase the dose.  Will follow up in 1 month.      Influenza A    Completed course of Tamiflu and Prednisone prescribed by the hospital.  Symptoms have improved.         Endocrine   Hypothyroidism    Chronic.  Ongoing.  Patient having some brain fog.  Discussed that this could be due to severely elevated TSH. Unsure of what dose was increased due during hospitalization.  Discharge summary states it was increased from 57mg to 2015m daily.  However, prescription sent in was for 10030mdaily.  Will have patient increase to 100m54maily and keep upcoming appointment with Endocrinology.  If she has not seen Endorinology in 4 weeks will recheck TSH and T4 at that time.  Follow up in 1 month.      Relevant Medications   levothyroxine (SYNTHROID) 100 MCG tablet     Genitourinary   Chronic kidney disease, stage 3a (HCC)    Creatinine elevated during hospitalization.  Will repeat CMP to evaluate in office today.  Follow up in 1 month.      Relevant Orders   Comp Met (CMET)   Other Visit Diagnoses     Hospital discharge follow-up    -  Primary   Hematuria, unspecified type       Will check UA during visit today to rule out UTI. Will make recommendations based on lab results.    Relevant Orders   Urinalysis, Routine  w reflex microscopic        Follow up plan: Return in about 1 month (around 12/11/2020) for HTN, HLD, DM2 FU.    A total of 40 minutes were spent on this encounter today.  When total time is documented, this includes both the face-to-face and non-face-to-face time personally spent before, during and after the visit on the date of the encounter reviewing notes, ordering labs, reviewing medications and discussing plan of care with patient.

## 2020-11-11 NOTE — Assessment & Plan Note (Signed)
Completed course of Tamiflu and Prednisone prescribed by the hospital.  Symptoms have improved.

## 2020-11-11 NOTE — Assessment & Plan Note (Signed)
Chronic.  Exacerbated on presentation to the hospital secondary to Influenza A. Patient was diuresed in the hospital due to pulmonary edema.  Has follow up with Cardiology today.

## 2020-11-11 NOTE — Assessment & Plan Note (Signed)
Chronic. Exacerbated due to influenza A.  She was weaned to 3L O2 which is her baseline.  Patient's appearance in office improved from visit last week.  No cyanosis and has more energy. Patient has not been sleeping well.  Discussed that when she see's pulmonology to request a sleep study.  States the Gabapentin has not been working for her but she doesn't want to increase the dose.  Will follow up in 1 month.

## 2020-11-11 NOTE — Assessment & Plan Note (Signed)
Creatinine elevated during hospitalization.  Will repeat CMP to evaluate in office today.  Follow up in 1 month.

## 2020-11-11 NOTE — Assessment & Plan Note (Signed)
Chronic.  Ongoing.  Patient having some brain fog.  Discussed that this could be due to severely elevated TSH. Unsure of what dose was increased due during hospitalization.  Discharge summary states it was increased from 34mcg to 212mcg daily.  However, prescription sent in was for 135mcg daily.  Will have patient increase to 111mcg daily and keep upcoming appointment with Endocrinology.  If she has not seen Endorinology in 4 weeks will recheck TSH and T4 at that time.  Follow up in 1 month.

## 2020-11-12 LAB — COMPREHENSIVE METABOLIC PANEL
ALT: 12 IU/L (ref 0–32)
AST: 13 IU/L (ref 0–40)
Albumin/Globulin Ratio: 1.2 (ref 1.2–2.2)
Albumin: 3.9 g/dL (ref 3.8–4.8)
Alkaline Phosphatase: 74 IU/L (ref 44–121)
BUN/Creatinine Ratio: 28 (ref 12–28)
BUN: 66 mg/dL — ABNORMAL HIGH (ref 8–27)
Bilirubin Total: 1.3 mg/dL — ABNORMAL HIGH (ref 0.0–1.2)
CO2: 35 mmol/L — ABNORMAL HIGH (ref 20–29)
Calcium: 9.4 mg/dL (ref 8.7–10.3)
Chloride: 91 mmol/L — ABNORMAL LOW (ref 96–106)
Creatinine, Ser: 2.33 mg/dL — ABNORMAL HIGH (ref 0.57–1.00)
Globulin, Total: 3.3 g/dL (ref 1.5–4.5)
Glucose: 103 mg/dL — ABNORMAL HIGH (ref 70–99)
Potassium: 4.1 mmol/L (ref 3.5–5.2)
Sodium: 140 mmol/L (ref 134–144)
Total Protein: 7.2 g/dL (ref 6.0–8.5)
eGFR: 22 mL/min/{1.73_m2} — ABNORMAL LOW (ref >59)

## 2020-11-12 NOTE — Progress Notes (Signed)
Please let patent know that her lab work remains relatively the same from her hospitalization.  We will continue to monitor this at future visits.  Please let me know if she has any questions.

## 2020-11-16 ENCOUNTER — Ambulatory Visit: Payer: Medicare Other | Admitting: Endocrinology

## 2020-11-18 ENCOUNTER — Telehealth: Payer: Self-pay | Admitting: Nurse Practitioner

## 2020-11-18 DIAGNOSIS — Z791 Long term (current) use of non-steroidal anti-inflammatories (NSAID): Secondary | ICD-10-CM | POA: Diagnosis not present

## 2020-11-18 DIAGNOSIS — I081 Rheumatic disorders of both mitral and tricuspid valves: Secondary | ICD-10-CM | POA: Diagnosis not present

## 2020-11-18 DIAGNOSIS — I4819 Other persistent atrial fibrillation: Secondary | ICD-10-CM | POA: Diagnosis not present

## 2020-11-18 DIAGNOSIS — Z7901 Long term (current) use of anticoagulants: Secondary | ICD-10-CM | POA: Diagnosis not present

## 2020-11-18 DIAGNOSIS — I4729 Other ventricular tachycardia: Secondary | ICD-10-CM | POA: Diagnosis not present

## 2020-11-18 DIAGNOSIS — J449 Chronic obstructive pulmonary disease, unspecified: Secondary | ICD-10-CM

## 2020-11-18 DIAGNOSIS — Z87891 Personal history of nicotine dependence: Secondary | ICD-10-CM | POA: Diagnosis not present

## 2020-11-18 DIAGNOSIS — G2581 Restless legs syndrome: Secondary | ICD-10-CM | POA: Diagnosis not present

## 2020-11-18 DIAGNOSIS — Z7951 Long term (current) use of inhaled steroids: Secondary | ICD-10-CM | POA: Diagnosis not present

## 2020-11-18 DIAGNOSIS — I130 Hypertensive heart and chronic kidney disease with heart failure and stage 1 through stage 4 chronic kidney disease, or unspecified chronic kidney disease: Secondary | ICD-10-CM | POA: Diagnosis not present

## 2020-11-18 DIAGNOSIS — Z9981 Dependence on supplemental oxygen: Secondary | ICD-10-CM | POA: Diagnosis not present

## 2020-11-18 DIAGNOSIS — I447 Left bundle-branch block, unspecified: Secondary | ICD-10-CM | POA: Diagnosis not present

## 2020-11-18 DIAGNOSIS — N1831 Chronic kidney disease, stage 3a: Secondary | ICD-10-CM | POA: Diagnosis not present

## 2020-11-18 DIAGNOSIS — I5043 Acute on chronic combined systolic (congestive) and diastolic (congestive) heart failure: Secondary | ICD-10-CM | POA: Diagnosis not present

## 2020-11-18 DIAGNOSIS — E785 Hyperlipidemia, unspecified: Secondary | ICD-10-CM | POA: Diagnosis not present

## 2020-11-18 DIAGNOSIS — J9620 Acute and chronic respiratory failure, unspecified whether with hypoxia or hypercapnia: Secondary | ICD-10-CM | POA: Diagnosis not present

## 2020-11-18 DIAGNOSIS — I2510 Atherosclerotic heart disease of native coronary artery without angina pectoris: Secondary | ICD-10-CM | POA: Diagnosis not present

## 2020-11-18 DIAGNOSIS — N200 Calculus of kidney: Secondary | ICD-10-CM | POA: Diagnosis not present

## 2020-11-18 DIAGNOSIS — I872 Venous insufficiency (chronic) (peripheral): Secondary | ICD-10-CM | POA: Diagnosis not present

## 2020-11-18 DIAGNOSIS — F32A Depression, unspecified: Secondary | ICD-10-CM | POA: Diagnosis not present

## 2020-11-18 DIAGNOSIS — Z9181 History of falling: Secondary | ICD-10-CM | POA: Diagnosis not present

## 2020-11-18 DIAGNOSIS — D500 Iron deficiency anemia secondary to blood loss (chronic): Secondary | ICD-10-CM | POA: Diagnosis not present

## 2020-11-18 DIAGNOSIS — G4700 Insomnia, unspecified: Secondary | ICD-10-CM | POA: Diagnosis not present

## 2020-11-18 DIAGNOSIS — I420 Dilated cardiomyopathy: Secondary | ICD-10-CM | POA: Diagnosis not present

## 2020-11-18 NOTE — Telephone Encounter (Signed)
Copied from [facility] [id number]. Topic: General - Other >> Nov 18, 2020 12:32 PM [name], Maria Neal wrote: Reason for CRM: Caller stated the pt needs Neal more portable oxygen tank which requires Neal new prescription, please advise if Neal new order can be placed.

## 2020-11-18 NOTE — Telephone Encounter (Signed)
Fax # for [facility] is [phone] if you are able to fill prescription without appointment.  Please follow up with pt's daughter, [name] at [phone].

## 2020-11-19 NOTE — Telephone Encounter (Signed)
Prescription for Oxygen was placed and signed and faxed over to patient local pharmacy by provider.

## 2020-11-19 NOTE — Telephone Encounter (Signed)
Yes I will sign it without an appt.

## 2020-11-21 NOTE — Progress Notes (Deleted)
   Patient ID: Maria Neal, female    DOB: 05-May-1950, 70 y.o.   MRN: [id number]  HPI  Maria Neal is a 70 y/o female with a history of  Echo report from 08/10/20 reviewed and showed an EF of 20-25% along with severe LVH/ LAE, moderate MR, mild AR and Moderate (Grade III) atheroma plaque involving the aortic root and ascending aorta.  Admitted 11/04/20 due to shortness of breath, fever, chills and fatigue. Noted to be hypoxic and placed on oxygen. Tested + for influenza A. Treated with tamiflu and initially given IV lasix with transition to oral diuretics. PT evaluation done. Discharged after 3 days.   She presents today for her initial visit with a chief complaint of   Review of Systems    Physical Exam  Assessment & Plan:  1: Chronic heart failure with reduced ejection fraction- - NYHA class - BNP 11/07/20 was 2479.7  2: HTN with CKD- - BP - saw PCP Maria Neal) 11/11/20 - saw nephrology Maria Neal) 10/25/20 - BMP 11/11/20 reviewed and showed sodium 140, potassium 4.1, creatinine 2.33 and GFR 22  3: Atrial fibrillation- - saw cardiology (Maria Neal) 11/03/20  4: COPD-

## 2020-11-22 ENCOUNTER — Telehealth: Payer: Self-pay | Admitting: Family

## 2020-11-22 ENCOUNTER — Telehealth: Payer: Self-pay

## 2020-11-22 ENCOUNTER — Ambulatory Visit: Payer: Medicare Other | Admitting: Family

## 2020-11-22 NOTE — Telephone Encounter (Signed)
Order sent to [facility] per Destiny, CMA. Called and notified patient's daughter of this.

## 2020-11-22 NOTE — Telephone Encounter (Signed)
Pts daughter called in stating it needed to be sent over to [facility] instead of the pharmacy at fax: [phone]. Please advise.

## 2020-11-22 NOTE — Telephone Encounter (Signed)
Patient did not show for her [facility] appointment on 11/22/20. Will attempt to reschedule.

## 2020-11-22 NOTE — Telephone Encounter (Signed)
Correction: Orders were printed and signed and sent to [facility].

## 2020-11-29 ENCOUNTER — Other Ambulatory Visit: Payer: Self-pay

## 2020-11-29 ENCOUNTER — Other Ambulatory Visit: Payer: Self-pay | Admitting: Nurse Practitioner

## 2020-11-29 ENCOUNTER — Ambulatory Visit
Admission: RE | Admit: 2020-11-29 | Discharge: 2020-11-29 | Disposition: A | Discharge status: Home / Self Care | Payer: Medicare Other | Source: Ambulatory Visit | Attending: Nurse Practitioner | Admitting: Nurse Practitioner

## 2020-11-29 DIAGNOSIS — N6489 Other specified disorders of breast: Secondary | ICD-10-CM

## 2020-11-29 DIAGNOSIS — Z1231 Encounter for screening mammogram for malignant neoplasm of breast: Secondary | ICD-10-CM | POA: Diagnosis not present

## 2020-11-29 DIAGNOSIS — R921 Mammographic calcification found on diagnostic imaging of breast: Secondary | ICD-10-CM

## 2020-11-29 DIAGNOSIS — R928 Other abnormal and inconclusive findings on diagnostic imaging of breast: Secondary | ICD-10-CM

## 2020-11-30 ENCOUNTER — Other Ambulatory Visit: Payer: Self-pay | Admitting: Nurse Practitioner

## 2020-11-30 DIAGNOSIS — R928 Other abnormal and inconclusive findings on diagnostic imaging of breast: Secondary | ICD-10-CM

## 2020-11-30 NOTE — Progress Notes (Signed)
Please let patient know that her mammogram was abnormal.  They would like her to do a diagnostic mammogram of her left breast.  I have placed the order.  Please make sure she has this scheduled.

## 2020-12-02 DIAGNOSIS — I8311 Varicose veins of right lower extremity with inflammation: Secondary | ICD-10-CM | POA: Diagnosis not present

## 2020-12-02 DIAGNOSIS — Z95810 Presence of automatic (implantable) cardiac defibrillator: Secondary | ICD-10-CM | POA: Diagnosis not present

## 2020-12-02 DIAGNOSIS — R0602 Shortness of breath: Secondary | ICD-10-CM | POA: Diagnosis not present

## 2020-12-02 DIAGNOSIS — J410 Simple chronic bronchitis: Secondary | ICD-10-CM | POA: Diagnosis not present

## 2020-12-02 DIAGNOSIS — I5022 Chronic systolic (congestive) heart failure: Secondary | ICD-10-CM | POA: Diagnosis not present

## 2020-12-02 DIAGNOSIS — I8312 Varicose veins of left lower extremity with inflammation: Secondary | ICD-10-CM | POA: Diagnosis not present

## 2020-12-02 DIAGNOSIS — I447 Left bundle-branch block, unspecified: Secondary | ICD-10-CM | POA: Diagnosis not present

## 2020-12-02 DIAGNOSIS — I10 Essential (primary) hypertension: Secondary | ICD-10-CM | POA: Diagnosis not present

## 2020-12-02 DIAGNOSIS — I428 Other cardiomyopathies: Secondary | ICD-10-CM | POA: Diagnosis not present

## 2020-12-02 DIAGNOSIS — Z9889 Other specified postprocedural states: Secondary | ICD-10-CM | POA: Diagnosis not present

## 2020-12-03 NOTE — Progress Notes (Signed)
Patient ID: Maria Neal, female    DOB: [birth year], 70 y.o.   MRN: [id number]  HPI  Ms Brossart is a 70 y/o female with a history of atrial fibrillation, CAD, HTN, anxiety, COPD, shingles, restless leg syndrome, previous tobacco use and chronic heart failure.   Echo report from 08/10/20 reviewed and showed an EF of 20-25% along with severe LVH/ LAE, moderate MR, mild AR and Moderate (Grade III) atheroma plaque involving the aortic root and ascending aorta.  Admitted 11/04/20 due to shortness of breath, fever, chills and fatigue. Noted to be hypoxic and placed on oxygen. Tested + for influenza A. Treated with tamiflu and initially given IV lasix with transition to oral diuretics. PT evaluation done. Discharged after 3 days.   She presents today for her initial visit with a chief complaint of minimal fatigue upon moderate exertion. She describes this as chronic in nature having been present for several years. She has associated palpitations and lightheadedness along with this. She denies any difficulty sleeping, abdominal distention, pedal edema, chest pain, shortness of breath, cough or weight gain.   Daughter that is present with her says that she notices that patient gets short of breath with exertion although patient denies feeling short off breath. Wearing her oxygen at 3L around the clock.   Past Medical History:  Diagnosis Date   Acute appendicitis 02/17/2018   AICD (automatic cardioverter/defibrillator) present    Anxiety state    Arrhythmia    atrial fibrillation   CAD (coronary artery disease)    Cardiomyopathy ([place])    CHF (congestive heart failure) (HCC)    COPD (chronic obstructive pulmonary disease) ([place])    Dyspnea    Headache    History of kidney stones    History of shingles    Hypertension    On home oxygen therapy    bedtime and prn   Restless leg syndrome    Past Surgical History:  Procedure Laterality Date   CARDIAC CATHETERIZATION  04/2014   Dr. Idelle Leech   CARDIAC  CATHETERIZATION     CARDIAC DEFIBRILLATOR PLACEMENT     CARDIOVERSION N/A 08/11/2020   Procedure: CARDIOVERSION;  Surgeon: Dixie Dials, MD;  Location: [facility] ENDOSCOPY;  Service: Cardiovascular;  Laterality: N/A;   CHOLECYSTECTOMY N/A 08/23/2016   Procedure: LAPAROSCOPIC CHOLECYSTECTOMY;  Surgeon: Clayburn Pert, MD;  Location: [facility] ORS;  Service: General;  Laterality: N/A;   [place] N/A 06/27/2018   Procedure: DILATATION & CURETTAGE/HYSTEROSCOPY WITH MYOSURE;  Surgeon: Malachy Mood, MD;  Location: [facility] ORS;  Service: Gynecology;  Laterality: N/A;   HERNIA REPAIR     LAPAROSCOPIC APPENDECTOMY N/A 02/17/2018   Procedure: APPENDECTOMY LAPAROSCOPIC;  Surgeon: Benjamine Sprague, DO;  Location: [facility] ORS;  Service: General;  Laterality: N/A;   PPM GENERATOR CHANGEOUT N/A 09/14/2020   Procedure: PPM GENERATOR CHANGEOUT;  Surgeon: Isaias Cowman, MD;  Location: [place] CV LAB;  Service: Cardiovascular;  Laterality: N/A;   TEE WITHOUT CARDIOVERSION N/A 08/10/2020   Procedure: TRANSESOPHAGEAL ECHOCARDIOGRAM (TEE);  Surgeon: Dixie Dials, MD;  Location: [facility] ENDOSCOPY;  Service: Cardiovascular;  Laterality: N/A;   Family History  Problem Relation Age of Onset   Diabetes Mellitus II Mother    Hypertension Mother    Heart attack Father    Arthritis Sister    Arthritis Sister    Depression Daughter    Heart disease Brother    Diabetes Brother    Depression Son    Schizophrenia Son    Breast cancer  Neg Hx    Social History   Tobacco Use   Smoking status: Former    Packs/day: 0.25    Types: Cigarettes   Smokeless tobacco: Never   Tobacco comments:    quit at age 68, whole pack lasted 3 weeks   Substance Use Topics   Alcohol use: No    Alcohol/week: 0.0 standard drinks   Allergies  Allergen Reactions   Levaquin [Levofloxacin] Anaphylaxis   Amoxicillin Hives    Did it involve swelling of the face/tongue/throat, SOB, or low BP? No Did  it involve sudden or severe rash/hives, skin peeling, or any reaction on the inside of your mouth or nose? No Did you need to seek medical attention at a hospital or doctor's office? No When did it last happen?      10+ years If all above answers are "NO", may proceed with cephalosporin use.   Nitrofuran Derivatives Other (See Comments)    Unknown   Penicillins Other (See Comments)    Thinks it made her itch a lot, but isn't sure. Did it involve swelling of the face/tongue/throat, SOB, or low BP? No Did it involve sudden or severe rash/hives, skin peeling, or any reaction on the inside of your mouth or nose? No Did you need to seek medical attention at a hospital or doctor's office? No When did it last happen?      10+ years If all above answers are "NO", may proceed with cephalosporin use.    Zithromax [Azithromycin] Other (See Comments)   Antihistamines, Chlorpheniramine-Type Rash   Prior to Admission medications   Medication Sig Start Date End Date Taking? Authorizing Provider  albuterol (PROVENTIL HFA;VENTOLIN HFA) 108 (90 Base) MCG/ACT inhaler Inhale 2 puffs into the lungs every 6 (six) hours as needed for wheezing or shortness of breath.   Yes [provider]  alum & mag hydroxide-simeth (MAALOX/MYLANTA) 200-200-20 MG/5ML suspension Take 30 mLs by mouth every 4 (four) hours as needed for indigestion or heartburn. 08/13/20  Yes Dixie Dials, MD  amiodarone (PACERONE) 200 MG tablet Take 1 tablet (200 mg total) by mouth daily. 08/13/20  Yes Dixie Dials, MD  apixaban (ELIQUIS) 5 MG TABS tablet Take 1 tablet (5 mg total) by mouth 2 (two) times daily. 08/13/20  Yes Dixie Dials, MD  carvedilol (COREG) 3.125 MG tablet Take 1 tablet (3.125 mg total) by mouth 2 (two) times daily. 11/07/20  Yes Shelly Coss, MD  clotrimazole-betamethasone (LOTRISONE) cream Apply 1 application topically daily. 10/12/20  Yes Jon Billings, NP  digoxin 62.5 MCG TABS Take 0.0625 mg by mouth daily.  08/14/20  Yes Dixie Dials, MD  fluticasone (FLONASE) 50 MCG/ACT nasal spray Place 2 sprays into both nostrils daily. 07/26/18  Yes Volney American, PA-C  Fluticasone-Umeclidin-Vilant (TRELEGY ELLIPTA) 100-62.5-25 MCG/INH AEPB Inhale 1 puff into the lungs daily. 02/12/20  Yes [provider]  gabapentin (NEURONTIN) 100 MG capsule Take 1 capsule (100 mg total) by mouth at bedtime. 08/13/20  Yes Dixie Dials, MD  levothyroxine (SYNTHROID) 100 MCG tablet Take 1 tablet (100 mcg total) by mouth daily. 11/11/20  Yes Jon Billings, NP  midodrine (PROAMATINE) 5 MG tablet Take 1 tablet (5 mg total) by mouth 3 (three) times daily with meals. 11/07/20  Yes Shelly Coss, MD  nystatin cream (MYCOSTATIN) Apply 1 application topically 2 (two) times daily. 09/23/20  Yes Jon Billings, NP  OXYGEN Inhale 3 L into the lungs 3 (three) times daily as needed (shortness of breath or coughing).   Yes  [provider]  spironolactone (ALDACTONE) 25 MG tablet Take 12.5 mg by mouth daily. 04/09/17  Yes [provider]  torsemide (DEMADEX) 20 MG tablet Take 20 mg by mouth daily.   Yes [provider]  omeprazole (PRILOSEC) 20 MG capsule TAKE 1 CAPSULE BY MOUTH ONCE DAILY Patient not taking: Reported on 12/06/2020 10/01/20   Jon Billings, NP   Review of Systems  Constitutional:  Positive for fatigue. Negative for appetite change.  HENT:  Negative for congestion, postnasal drip and sore throat.   Eyes: Negative.   Respiratory:  Negative for chest tightness and shortness of breath (daughter says patient is short of breath; patient denies).   Cardiovascular:  Positive for palpitations (sometimes). Negative for chest pain and leg swelling.  Gastrointestinal:  Negative for abdominal distention and abdominal pain.  Endocrine: Negative.   Genitourinary: Negative.   Musculoskeletal:  Negative for back pain and neck pain.  Skin: Negative.   Allergic/Immunologic: Negative.    Neurological:  Positive for light-headedness (sometimes). Negative for dizziness.  Hematological:  Negative for adenopathy. Does not bruise/bleed easily.  Psychiatric/Behavioral:  Negative for dysphoric mood and sleep disturbance (sleeping on 2 pillows). The patient is not nervous/anxious.    Vitals:   12/06/20 1024  BP: (!) 121/51  Pulse: 78  Resp: 16  SpO2: 100%  Weight: 154 lb 6 oz (70 kg)  Height: 5\' 4"  (1.626 m)   Wt Readings from Last 3 Encounters:  12/06/20 154 lb 6 oz (70 kg)  11/11/20 169 lb (76.7 kg)  11/07/20 171 lb 1.2 oz (77.6 kg)   Lab Results  Component Value Date   CREATININE 2.33 (H) 11/11/2020   CREATININE 2.25 (H) 11/07/2020   CREATININE 2.39 (H) 11/06/2020   Physical Exam Vitals and nursing note reviewed. Exam conducted with a chaperone present (daughter).  Constitutional:      Appearance: Normal appearance.  HENT:     Head: Normocephalic and atraumatic.  Cardiovascular:     Rate and Rhythm: Normal rate. Rhythm irregular.  Pulmonary:     Effort: Pulmonary effort is normal. No respiratory distress.     Breath sounds: No wheezing or rales.  Abdominal:     General: There is no distension.     Palpations: Abdomen is soft.  Musculoskeletal:        General: No tenderness.     Cervical back: Normal range of motion and neck supple.     Right lower leg: No edema.     Left lower leg: No edema.     Comments: Large varicose vein left calf; nontender to palpation  Skin:    General: Skin is warm and dry.  Neurological:     General: No focal deficit present.     Mental Status: She is alert and oriented to person, place, and time.  Psychiatric:        Behavior: Behavior normal.        Thought Content: Thought content normal.   Assessment & Plan:  1: Chronic heart failure with reduced ejection fraction- - NYHA class II - euvolemic today - has scales and weighs daily; instructed to call for an overnight weight gain of > 2 pounds or a weekly weight gain of  > 5 pounds - not adding salt to her food and tries to read food labels for sodium content - on GDMT of carvedilol and spironolactone - renal function limits use of entresto; could consider jardiance - has AICD present without discharge - BNP 11/07/20 was 2479.7  2: HTN with CKD- - BP looks good (121/51); currently on midodrine TID - saw PCP Mathis Dad) 11/11/20 - saw nephrology Holley Raring) 10/25/20 - BMP 11/11/20 reviewed and showed sodium 140, potassium 4.1, creatinine 2.33 and GFR 22  3: Atrial fibrillation- - saw cardiology ([place]) 12/02/20 - rate controlled on carvedilol and digoxin  4: COPD- - sees pulmonology Raul Del) on 01/19/21 - wearing oxygen at 3L around the clock   Patient did not bring her medications nor a list. Each medication was verbally reviewed with the patient and she was encouraged to bring the bottles to every visit to confirm accuracy of list.   Return in 1 month or sooner for any questions/problems before then.

## 2020-12-06 ENCOUNTER — Other Ambulatory Visit: Payer: Self-pay

## 2020-12-06 ENCOUNTER — Ambulatory Visit: Payer: Medicare Other | Attending: Family | Admitting: Family

## 2020-12-06 ENCOUNTER — Telehealth: Payer: Self-pay | Admitting: Nurse Practitioner

## 2020-12-06 ENCOUNTER — Encounter: Payer: Self-pay | Admitting: Family

## 2020-12-06 VITALS — BP 121/51 | HR 78 | Resp 16 | Ht 64.0 in | Wt 154.4 lb

## 2020-12-06 DIAGNOSIS — I8392 Asymptomatic varicose veins of left lower extremity: Secondary | ICD-10-CM | POA: Insufficient documentation

## 2020-12-06 DIAGNOSIS — Z9981 Dependence on supplemental oxygen: Secondary | ICD-10-CM | POA: Diagnosis not present

## 2020-12-06 DIAGNOSIS — I5022 Chronic systolic (congestive) heart failure: Secondary | ICD-10-CM | POA: Insufficient documentation

## 2020-12-06 DIAGNOSIS — I2510 Atherosclerotic heart disease of native coronary artery without angina pectoris: Secondary | ICD-10-CM | POA: Insufficient documentation

## 2020-12-06 DIAGNOSIS — I482 Chronic atrial fibrillation, unspecified: Secondary | ICD-10-CM | POA: Diagnosis not present

## 2020-12-06 DIAGNOSIS — G2581 Restless legs syndrome: Secondary | ICD-10-CM | POA: Insufficient documentation

## 2020-12-06 DIAGNOSIS — Z79899 Other long term (current) drug therapy: Secondary | ICD-10-CM | POA: Diagnosis not present

## 2020-12-06 DIAGNOSIS — I4891 Unspecified atrial fibrillation: Secondary | ICD-10-CM | POA: Diagnosis not present

## 2020-12-06 DIAGNOSIS — I130 Hypertensive heart and chronic kidney disease with heart failure and stage 1 through stage 4 chronic kidney disease, or unspecified chronic kidney disease: Secondary | ICD-10-CM | POA: Diagnosis not present

## 2020-12-06 DIAGNOSIS — I10 Essential (primary) hypertension: Secondary | ICD-10-CM

## 2020-12-06 DIAGNOSIS — N189 Chronic kidney disease, unspecified: Secondary | ICD-10-CM | POA: Insufficient documentation

## 2020-12-06 DIAGNOSIS — J449 Chronic obstructive pulmonary disease, unspecified: Secondary | ICD-10-CM | POA: Insufficient documentation

## 2020-12-06 DIAGNOSIS — Z95810 Presence of automatic (implantable) cardiac defibrillator: Secondary | ICD-10-CM | POA: Insufficient documentation

## 2020-12-06 MED ORDER — MIDODRINE HCL 5 MG PO TABS: 5.0000 mg | ORAL_TABLET | Freq: Three times a day (TID) | ORAL | 0 refills | Status: DC

## 2020-12-06 NOTE — Telephone Encounter (Signed)
Spoke with patient daughter Maria Neal and notified her of Maria Neal's recommendations. Maria Neal verbalized understanding and has no further questions at time. Maria Neal states they will be at the scheduled appointment next Monday.

## 2020-12-06 NOTE — Telephone Encounter (Signed)
Maria Neal states they saw the [facility] today and states the person her mother saw didn't know much about the patient. Maria Neal states the provider just mentioned that "the patient was on a lot." Please advise?

## 2020-12-06 NOTE — Telephone Encounter (Signed)
Patient saw Cardiology today.  Please ask her daughter if she asked the Cardiology NP

## 2020-12-06 NOTE — Patient Instructions (Signed)
Continue weighing daily and call for an overnight weight gain of 3 pounds or more or a weekly weight gain of more than 5 pounds.  °

## 2020-12-06 NOTE — Telephone Encounter (Signed)
Pt daughter called and stated that patient was prescribed Midodrine in the hospital and would like to know if patient should still be taking this. If so patient will need a refill. Please advise

## 2020-12-06 NOTE — Telephone Encounter (Signed)
I have sent a refill to the pharmacy. Looks like Her blood pressure has been normal while being on the medication.  We will keep her with 12/12 for follow up.

## 2020-12-08 DIAGNOSIS — J449 Chronic obstructive pulmonary disease, unspecified: Secondary | ICD-10-CM | POA: Diagnosis not present

## 2020-12-08 DIAGNOSIS — J9690 Respiratory failure, unspecified, unspecified whether with hypoxia or hypercapnia: Secondary | ICD-10-CM | POA: Diagnosis not present

## 2020-12-09 DIAGNOSIS — N200 Calculus of kidney: Secondary | ICD-10-CM | POA: Diagnosis not present

## 2020-12-09 DIAGNOSIS — Z791 Long term (current) use of non-steroidal anti-inflammatories (NSAID): Secondary | ICD-10-CM | POA: Diagnosis not present

## 2020-12-09 DIAGNOSIS — Z7901 Long term (current) use of anticoagulants: Secondary | ICD-10-CM | POA: Diagnosis not present

## 2020-12-09 DIAGNOSIS — N1831 Chronic kidney disease, stage 3a: Secondary | ICD-10-CM | POA: Diagnosis not present

## 2020-12-09 DIAGNOSIS — Z9181 History of falling: Secondary | ICD-10-CM | POA: Diagnosis not present

## 2020-12-09 DIAGNOSIS — E785 Hyperlipidemia, unspecified: Secondary | ICD-10-CM | POA: Diagnosis not present

## 2020-12-09 DIAGNOSIS — I872 Venous insufficiency (chronic) (peripheral): Secondary | ICD-10-CM | POA: Diagnosis not present

## 2020-12-09 DIAGNOSIS — I4819 Other persistent atrial fibrillation: Secondary | ICD-10-CM | POA: Diagnosis not present

## 2020-12-09 DIAGNOSIS — I420 Dilated cardiomyopathy: Secondary | ICD-10-CM | POA: Diagnosis not present

## 2020-12-09 DIAGNOSIS — F32A Depression, unspecified: Secondary | ICD-10-CM | POA: Diagnosis not present

## 2020-12-09 DIAGNOSIS — I081 Rheumatic disorders of both mitral and tricuspid valves: Secondary | ICD-10-CM | POA: Diagnosis not present

## 2020-12-09 DIAGNOSIS — I5043 Acute on chronic combined systolic (congestive) and diastolic (congestive) heart failure: Secondary | ICD-10-CM | POA: Diagnosis not present

## 2020-12-09 DIAGNOSIS — J449 Chronic obstructive pulmonary disease, unspecified: Secondary | ICD-10-CM | POA: Diagnosis not present

## 2020-12-09 DIAGNOSIS — G2581 Restless legs syndrome: Secondary | ICD-10-CM | POA: Diagnosis not present

## 2020-12-09 DIAGNOSIS — J9620 Acute and chronic respiratory failure, unspecified whether with hypoxia or hypercapnia: Secondary | ICD-10-CM | POA: Diagnosis not present

## 2020-12-09 DIAGNOSIS — I2510 Atherosclerotic heart disease of native coronary artery without angina pectoris: Secondary | ICD-10-CM | POA: Diagnosis not present

## 2020-12-09 DIAGNOSIS — Z87891 Personal history of nicotine dependence: Secondary | ICD-10-CM | POA: Diagnosis not present

## 2020-12-09 DIAGNOSIS — D500 Iron deficiency anemia secondary to blood loss (chronic): Secondary | ICD-10-CM | POA: Diagnosis not present

## 2020-12-09 DIAGNOSIS — Z9981 Dependence on supplemental oxygen: Secondary | ICD-10-CM | POA: Diagnosis not present

## 2020-12-09 DIAGNOSIS — I130 Hypertensive heart and chronic kidney disease with heart failure and stage 1 through stage 4 chronic kidney disease, or unspecified chronic kidney disease: Secondary | ICD-10-CM | POA: Diagnosis not present

## 2020-12-09 DIAGNOSIS — G4700 Insomnia, unspecified: Secondary | ICD-10-CM | POA: Diagnosis not present

## 2020-12-09 DIAGNOSIS — Z7951 Long term (current) use of inhaled steroids: Secondary | ICD-10-CM | POA: Diagnosis not present

## 2020-12-09 DIAGNOSIS — I4729 Other ventricular tachycardia: Secondary | ICD-10-CM | POA: Diagnosis not present

## 2020-12-09 DIAGNOSIS — I447 Left bundle-branch block, unspecified: Secondary | ICD-10-CM | POA: Diagnosis not present

## 2020-12-10 ENCOUNTER — Telehealth: Payer: Self-pay | Admitting: Nurse Practitioner

## 2020-12-10 ENCOUNTER — Encounter: Payer: Self-pay | Admitting: Endocrinology

## 2020-12-10 ENCOUNTER — Telehealth: Payer: Self-pay

## 2020-12-10 ENCOUNTER — Other Ambulatory Visit: Payer: Self-pay

## 2020-12-10 ENCOUNTER — Ambulatory Visit (INDEPENDENT_AMBULATORY_CARE_PROVIDER_SITE_OTHER): Payer: Medicare Other | Admitting: Endocrinology

## 2020-12-10 VITALS — BP 110/52 | HR 54 | Ht 64.0 in | Wt 150.0 lb

## 2020-12-10 DIAGNOSIS — E039 Hypothyroidism, unspecified: Secondary | ICD-10-CM

## 2020-12-10 LAB — T4, FREE: Free T4: 1.08 ng/dL (ref 0.60–1.60)

## 2020-12-10 LAB — TSH: TSH: 86.45 u[IU]/mL — ABNORMAL HIGH (ref 0.35–5.50)

## 2020-12-10 LAB — T3, FREE: T3, Free: 2.3 pg/mL (ref 2.3–4.2)

## 2020-12-10 MED ORDER — LEVOTHYROXINE SODIUM 175 MCG PO TABS: 175.0000 ug | ORAL_TABLET | Freq: Every day | ORAL | 3 refills | Status: AC

## 2020-12-10 NOTE — Telephone Encounter (Signed)
-----   Message from Renato Shin, MD sent at 12/10/2020 12:45 PM EST ----- please contact patient's dtr: I have sent a prescription to your pharmacy, to increase the thyroid pill.  Please redo the blood tests in 2 weeks.   I'll see you next time.

## 2020-12-10 NOTE — Patient Instructions (Addendum)
The low thyroid is probably from the amiodarone.  However, this does not mean you should stop it. Blood tests are requested for you today.  We'll call your daughter with the results.  Based on the results, we may need to increase the levothyroxine. Please come back for a follow-up appointment in 1 month.

## 2020-12-10 NOTE — Telephone Encounter (Signed)
Attempted to contact the patient, unable to leave voicemail as it says unable to connect the call

## 2020-12-10 NOTE — Telephone Encounter (Signed)
Copied from [place] [phone]. Topic: Medicare AWV >> Dec 10, 2020  1:57 PM Lavonia Drafts wrote: Reason for CRM: N/A unable to leave a message for patient to call back and schedule the Medicare Annual Wellness Visit (AWV) virtually or by telephone.  Last AWV 04/30/19  Please schedule at anytime with [facility]-Nurse Health Advisor.  45 minute appointment  Any questions, please call me at [phone]

## 2020-12-10 NOTE — Progress Notes (Signed)
Subjective:    Patient ID: Maria Neal, female    DOB: [birth year], 70 y.o.   MRN: [id number]  HPI Pt's brother provides some hx, due to pt's poor overall health.  Pt is referred by Jon Billings, NP, for hypothyroidism.  She has been on amiodarone since 8/22.  hypothyroidism was dx'ed in 11/22. She was rx'ed synthroid.  she has never taken non-prescribed thyroid product.  she has never had thyroid imaging.  She has never had thyroid surgery, or XRT to the neck.  He has never been on lithium.  She reports weight loss, dry skin, and cold intolerance.  Since on synthroid, pt states she feels no different.   Past Medical History:  Diagnosis Date   Acute appendicitis 02/17/2018   AICD (automatic cardioverter/defibrillator) present    Anxiety state    Arrhythmia    atrial fibrillation   CAD (coronary artery disease)    Cardiomyopathy ([place])    CHF (congestive heart failure) (HCC)    COPD (chronic obstructive pulmonary disease) ([place])    Dyspnea    Headache    History of kidney stones    History of shingles    Hypertension    On home oxygen therapy    bedtime and prn   Restless leg syndrome     Past Surgical History:  Procedure Laterality Date   CARDIAC CATHETERIZATION  04/2014   Dr. Idelle Leech   CARDIAC CATHETERIZATION     CARDIAC DEFIBRILLATOR PLACEMENT     CARDIOVERSION N/A 08/11/2020   Procedure: CARDIOVERSION;  Surgeon: Dixie Dials, MD;  Location: [facility] ENDOSCOPY;  Service: Cardiovascular;  Laterality: N/A;   CHOLECYSTECTOMY N/A 08/23/2016   Procedure: LAPAROSCOPIC CHOLECYSTECTOMY;  Surgeon: Clayburn Pert, MD;  Location: [facility] ORS;  Service: General;  Laterality: N/A;   [place] N/A 06/27/2018   Procedure: DILATATION & CURETTAGE/HYSTEROSCOPY WITH MYOSURE;  Surgeon: Malachy Mood, MD;  Location: [facility] ORS;  Service: Gynecology;  Laterality: N/A;   HERNIA REPAIR     LAPAROSCOPIC APPENDECTOMY N/A 02/17/2018   Procedure: APPENDECTOMY  LAPAROSCOPIC;  Surgeon: Benjamine Sprague, DO;  Location: [facility] ORS;  Service: General;  Laterality: N/A;   PPM GENERATOR CHANGEOUT N/A 09/14/2020   Procedure: PPM GENERATOR CHANGEOUT;  Surgeon: Isaias Cowman, MD;  Location: [place] CV LAB;  Service: Cardiovascular;  Laterality: N/A;   TEE WITHOUT CARDIOVERSION N/A 08/10/2020   Procedure: TRANSESOPHAGEAL ECHOCARDIOGRAM (TEE);  Surgeon: Dixie Dials, MD;  Location: [facility] ENDOSCOPY;  Service: Cardiovascular;  Laterality: N/A;    Social History   Socioeconomic History   Marital status: Divorced    Spouse name: Not on file   Number of children: Not on file   Years of education: Not on file   Highest education level: High school graduate  Occupational History   Occupation: retired  Tobacco Use   Smoking status: Former    Packs/day: 0.25    Types: Cigarettes   Smokeless tobacco: Never   Tobacco comments:    quit at age 26, whole pack lasted 3 weeks   Vaping Use   Vaping Use: Never used  Substance and Sexual Activity   Alcohol use: No    Alcohol/week: 0.0 standard drinks   Drug use: No   Sexual activity: Not Currently    Birth control/protection: None  Other Topics Concern   Not on file  Social History Narrative   Not on file   Social Determinants of Health   Financial Resource Strain: Not on file  Food Insecurity: Not on  file  Transportation Needs: Not on file  Physical Activity: Not on file  Stress: Not on file  Social Connections: Not on file  Intimate Partner Violence: Not on file    Current Outpatient Medications on File Prior to Visit  Medication Sig Dispense Refill   albuterol (PROVENTIL HFA;VENTOLIN HFA) 108 (90 Base) MCG/ACT inhaler Inhale 2 puffs into the lungs every 6 (six) hours as needed for wheezing or shortness of breath.     alum & mag hydroxide-simeth (MAALOX/MYLANTA) 200-200-20 MG/5ML suspension Take 30 mLs by mouth every 4 (four) hours as needed for indigestion or heartburn. 355 mL 0   amiodarone  (PACERONE) 200 MG tablet Take 1 tablet (200 mg total) by mouth daily. 30 tablet 3   apixaban (ELIQUIS) 5 MG TABS tablet Take 1 tablet (5 mg total) by mouth 2 (two) times daily. 60 tablet 3   carvedilol (COREG) 3.125 MG tablet Take 1 tablet (3.125 mg total) by mouth 2 (two) times daily. 60 tablet 0   clotrimazole-betamethasone (LOTRISONE) cream Apply 1 application topically daily. 30 g 0   digoxin 62.5 MCG TABS Take 0.0625 mg by mouth daily. 30 tablet 2   fluticasone (FLONASE) 50 MCG/ACT nasal spray Place 2 sprays into both nostrils daily. 16 g 6   Fluticasone-Umeclidin-Vilant (TRELEGY ELLIPTA) 100-62.5-25 MCG/INH AEPB Inhale 1 puff into the lungs daily.     gabapentin (NEURONTIN) 100 MG capsule Take 1 capsule (100 mg total) by mouth at bedtime. 30 capsule 3   midodrine (PROAMATINE) 5 MG tablet Take 1 tablet (5 mg total) by mouth 3 (three) times daily with meals. 90 tablet 0   nystatin cream (MYCOSTATIN) Apply 1 application topically 2 (two) times daily. 30 g 1   omeprazole (PRILOSEC) 20 MG capsule TAKE 1 CAPSULE BY MOUTH ONCE DAILY (Patient not taking: Reported on 12/06/2020) 90 capsule 1   OXYGEN Inhale 3 L into the lungs 3 (three) times daily as needed (shortness of breath or coughing).     spironolactone (ALDACTONE) 25 MG tablet Take 12.5 mg by mouth daily.     torsemide (DEMADEX) 20 MG tablet Take 20 mg by mouth daily.     No current facility-administered medications on file prior to visit.    Allergies  Allergen Reactions   Levaquin [Levofloxacin] Anaphylaxis   Amoxicillin Hives    Did it involve swelling of the face/tongue/throat, SOB, or low BP? No Did it involve sudden or severe rash/hives, skin peeling, or any reaction on the inside of your mouth or nose? No Did you need to seek medical attention at a hospital or doctor's office? No When did it last happen?      10+ years If all above answers are "NO", may proceed with cephalosporin use.   Nitrofuran Derivatives Other (See  Comments)    Unknown   Penicillins Other (See Comments)    Thinks it made her itch a lot, but isn't sure. Did it involve swelling of the face/tongue/throat, SOB, or low BP? No Did it involve sudden or severe rash/hives, skin peeling, or any reaction on the inside of your mouth or nose? No Did you need to seek medical attention at a hospital or doctor's office? No When did it last happen?      10+ years If all above answers are "NO", may proceed with cephalosporin use.    Zithromax [Azithromycin] Other (See Comments)   Antihistamines, Chlorpheniramine-Type Rash    Family History  Problem Relation Age of Onset   Diabetes Mellitus II Mother  Hypertension Mother    Heart attack Father    Arthritis Sister    Arthritis Sister    Thyroid disease Brother    Heart disease Brother    Diabetes Brother    Depression Daughter    Depression Son    Schizophrenia Son    Breast cancer Neg Hx     BP (!) 110/52 (BP Location: Right Arm, Patient Position: Sitting, Cuff Size: Normal)   Pulse (!) 54   Ht 5\' 4"  (1.626 m)   Wt 150 lb (68 kg)   SpO2 (!) 80%   BMI 25.75 kg/m   Review of Systems denies depression, memory loss, constipation, and numbness.      Objective:   Physical Exam VS: see vs page GEN: no distress HEAD: head: no deformity eyes: no periorbital swelling, no proptosis external nose and ears are normal NECK: supple, thyroid is not enlarged CHEST WALL: no deformity LUNGS: clear to auscultation CV: reg rate and rhythm, no murmur.  MUSCULOSKELETAL: gait is steady, with a walker EXTEMITIES: no deformity.  no leg edema NEURO:  readily moves all 4's.  sensation is intact to touch on all 4's.   SKIN:  Normal texture and temperature.  No rash or suspicious lesion is visible.   NODES:  None palpable at the neck PSYCH: alert, well-oriented.  Does not appear anxious nor depressed.   Lab Results  Component Value Date   TSH 86.45 (H) 12/10/2020   T4TOTAL 3.1 (L) 10/07/2020       Assessment & Plan:  Hypothyroidism, due to amiodarone.  Uncontrolled.  I have sent a prescription to your pharmacy, to increase synthroid.

## 2020-12-13 ENCOUNTER — Ambulatory Visit: Payer: Medicare Other | Admitting: Nurse Practitioner

## 2020-12-13 ENCOUNTER — Other Ambulatory Visit: Payer: Self-pay

## 2020-12-13 ENCOUNTER — Encounter: Payer: Self-pay | Admitting: Nurse Practitioner

## 2020-12-13 ENCOUNTER — Ambulatory Visit (INDEPENDENT_AMBULATORY_CARE_PROVIDER_SITE_OTHER): Payer: Medicare Other | Admitting: Nurse Practitioner

## 2020-12-13 VITALS — BP 89/50 | HR 73 | Temp 97.5°F | Wt 148.8 lb

## 2020-12-13 DIAGNOSIS — E039 Hypothyroidism, unspecified: Secondary | ICD-10-CM | POA: Diagnosis not present

## 2020-12-13 DIAGNOSIS — D6859 Other primary thrombophilia: Secondary | ICD-10-CM | POA: Insufficient documentation

## 2020-12-13 DIAGNOSIS — I5022 Chronic systolic (congestive) heart failure: Secondary | ICD-10-CM | POA: Diagnosis not present

## 2020-12-13 DIAGNOSIS — I5023 Acute on chronic systolic (congestive) heart failure: Secondary | ICD-10-CM | POA: Diagnosis not present

## 2020-12-13 DIAGNOSIS — I700 Atherosclerosis of aorta: Secondary | ICD-10-CM | POA: Insufficient documentation

## 2020-12-13 DIAGNOSIS — N1831 Chronic kidney disease, stage 3a: Secondary | ICD-10-CM

## 2020-12-13 DIAGNOSIS — J9621 Acute and chronic respiratory failure with hypoxia: Secondary | ICD-10-CM | POA: Diagnosis not present

## 2020-12-13 DIAGNOSIS — I420 Dilated cardiomyopathy: Secondary | ICD-10-CM | POA: Diagnosis not present

## 2020-12-13 DIAGNOSIS — L299 Pruritus, unspecified: Secondary | ICD-10-CM | POA: Diagnosis not present

## 2020-12-13 DIAGNOSIS — J9611 Chronic respiratory failure with hypoxia: Secondary | ICD-10-CM

## 2020-12-13 DIAGNOSIS — Z950 Presence of cardiac pacemaker: Secondary | ICD-10-CM | POA: Diagnosis not present

## 2020-12-13 DIAGNOSIS — I4821 Permanent atrial fibrillation: Secondary | ICD-10-CM | POA: Diagnosis not present

## 2020-12-13 MED ORDER — TRIAMCINOLONE ACETONIDE 0.025 % EX OINT: 1.0000 "application " | TOPICAL_OINTMENT | Freq: Two times a day (BID) | CUTANEOUS | 1 refills | Status: DC

## 2020-12-13 NOTE — Assessment & Plan Note (Signed)
Chronic. Platelets have been consistently low. Will recheck CBC in office today.

## 2020-12-13 NOTE — Assessment & Plan Note (Signed)
Chronic. Ongoing. Blood pressure running low in the office today. Discussed checking blood pressures at home. Home BP monitor order given to patient during visit. Recommend increasing water intake, increasing protein and amount she is eating daily.  Follow up in 2 weeks for BP check.

## 2020-12-13 NOTE — Assessment & Plan Note (Signed)
Labs ordered today.  Will make recommendations based on lab results. ?

## 2020-12-13 NOTE — Assessment & Plan Note (Signed)
Followed by Dr. Loanne Drilling. Reminded patient and daughter of new dose of medication that was sent in. Plan to follow up for labs in 2 weeks. They plan to pick up new dose today.  Follow up with Dr. Loanne Drilling as discussed.

## 2020-12-13 NOTE — Progress Notes (Signed)
BP (!) 89/50   Pulse 73   Temp (!) 97.5 F (36.4 C) (Oral)   Wt 148 lb 12.8 oz (67.5 kg)   SpO2 96%   BMI 25.54 kg/m    Subjective:    Patient ID: Maria Neal, female    DOB: 1950/06/27, 70 y.o.   MRN: [id number]  HPI: Maria Neal is a 70 y.o. female  Chief Complaint  Patient presents with   Diabetes   Hyperlipidemia   Hypertension   HYPOTHYROIDISM  COPD COPD status: controlled Satisfied with current treatment?: yes Oxygen use: yes- 3L Dyspnea frequency: yes with activity Cough frequency: no Rescue inhaler frequency:  no use Limitation of activity: yes Productive cough:  Last Spirometry:  Pneumovax: Up to Date Influenza: Up to Date  CHRONIC KIDNEY DISEASE CKD status: controlled Medications renally dose: yes Previous renal evaluation: no Pneumovax:  Up to Date Influenza Vaccine:  Up to Date  HYPERTENSION / HYPERLIPIDEMIA/HEART FAILURE Patient is seeing Cardiology. States goal is to get her off the Midodrine and back on her Carvedilol. Patient is not checking blood pressures at home. Does have SOB and is on home O2.   Denies HA, CP, dizziness, palpitations, visual changes, and lower extremity swelling.    Relevant past medical, surgical, family and social history reviewed and updated as indicated. Interim medical history since our last visit reviewed. Allergies and medications reviewed and updated.  Review of Systems  Eyes:  Negative for visual disturbance.  Respiratory:  Positive for shortness of breath. Negative for cough and chest tightness.   Cardiovascular:  Negative for chest pain, palpitations and leg swelling.  Skin:        Itching on bottom of feet.   Neurological:  Negative for dizziness and headaches.   Per HPI unless specifically indicated above     Objective:    BP (!) 89/50   Pulse 73   Temp (!) 97.5 F (36.4 C) (Oral)   Wt 148 lb 12.8 oz (67.5 kg)   SpO2 96%   BMI 25.54 kg/m   Wt Readings from Last 3 Encounters:  12/13/20  148 lb 12.8 oz (67.5 kg)  12/10/20 150 lb (68 kg)  12/06/20 154 lb 6 oz (70 kg)    Physical Exam Vitals and nursing note reviewed.  Constitutional:      General: She is not in acute distress.    Appearance: Normal appearance. She is normal weight. She is not ill-appearing, toxic-appearing or diaphoretic.  HENT:     Head: Normocephalic.     Right Ear: External ear normal.     Left Ear: External ear normal.     Nose: Nose normal.     Mouth/Throat:     Mouth: Mucous membranes are moist.     Pharynx: Oropharynx is clear.  Eyes:     General:        Right eye: No discharge.        Left eye: No discharge.     Extraocular Movements: Extraocular movements intact.     Conjunctiva/sclera: Conjunctivae normal.     Pupils: Pupils are equal, round, and reactive to light.  Cardiovascular:     Rate and Rhythm: Normal rate and regular rhythm.     Heart sounds: Murmur heard.  Pulmonary:     Effort: Pulmonary effort is normal. No respiratory distress.     Breath sounds: Normal breath sounds. No wheezing or rales.  Musculoskeletal:     Cervical back: Normal range of motion and neck  supple.  Skin:    General: Skin is warm and dry.     Capillary Refill: Capillary refill takes less than 2 seconds.       Neurological:     General: No focal deficit present.     Mental Status: She is alert and oriented to person, place, and time. Mental status is at baseline.  Psychiatric:        Mood and Affect: Mood normal.        Behavior: Behavior normal.        Thought Content: Thought content normal.        Judgment: Judgment normal.    Results for orders placed or performed in visit on 12/10/20  T3, free  Result Value Ref Range   T3, Free 2.3 2.3 - 4.2 pg/mL  T4, free  Result Value Ref Range   Free T4 1.08 0.60 - 1.60 ng/dL  TSH  Result Value Ref Range   TSH 86.45 (H) 0.35 - 5.50 uIU/mL      Assessment & Plan:   Problem List Items Addressed This Visit       Cardiovascular and Mediastinum    Chronic systolic congestive heart failure, NYHA class 3 (HCC) - Primary    Chronic. Ongoing. Blood pressure running low in the office today. Discussed checking blood pressures at home. Home BP monitor order given to patient during visit. Recommend increasing water intake, increasing protein and amount she is eating daily.  Follow up in 2 weeks for BP check.       Relevant Medications   carvedilol (COREG) 6.25 MG tablet   Aortic atherosclerosis (HCC)    Chronic.  Controlled.  Continue with current medication regimen.  Labs ordered today.  Return to clinic in 6 months for reevaluation.  Call sooner if concerns arise.        Relevant Medications   carvedilol (COREG) 6.25 MG tablet   Other Relevant Orders   CBC w/Diff   Comp Met (CMET)     Respiratory   Chronic respiratory failure with hypoxia (HCC) - 3 L/min    Chronic.  Controlled.  Continue with current medication regimen of 3L.  Labs ordered today.  Return to clinic in 6 months for reevaluation.  Call sooner if concerns arise.          Endocrine   Hypothyroidism    Followed by Dr. Loanne Drilling. Reminded patient and daughter of new dose of medication that was sent in. Plan to follow up for labs in 2 weeks. They plan to pick up new dose today.  Follow up with Dr. Loanne Drilling as discussed.       Relevant Medications   carvedilol (COREG) 6.25 MG tablet     Genitourinary   Chronic kidney disease, stage 3a ([place])    Labs ordered today. Will make recommendations based on lab results.       Relevant Orders   CBC w/Diff   Comp Met (CMET)     Hematopoietic and Hemostatic   Thrombophilia (HCC)    Chronic. Platelets have been consistently low. Will recheck CBC in office today.       Relevant Orders   CBC w/Diff   Comp Met (CMET)   Other Visit Diagnoses     Itching       Complains of dry itchy feet. Likely due to hypothyrodism. Recommend triamcinalone and aquaphor to help with patient's symptoms. FU if symptoms do not improve.         Follow up plan: Return  in about 2 weeks (around 12/27/2020) for BP Check.

## 2020-12-13 NOTE — Assessment & Plan Note (Signed)
Chronic.  Controlled.  Continue with current medication regimen of 3L.  Labs ordered today.  Return to clinic in 6 months for reevaluation.  Call sooner if concerns arise.

## 2020-12-13 NOTE — Assessment & Plan Note (Signed)
Chronic.  Controlled.  Continue with current medication regimen.  Labs ordered today.  Return to clinic in 6 months for reevaluation.  Call sooner if concerns arise.  ? ?

## 2020-12-14 ENCOUNTER — Inpatient Hospital Stay
Admission: EM | Admit: 2020-12-14 | Discharge: 2020-12-18 | DRG: 378 | Disposition: A | Discharge status: Home / Self Care | Payer: Medicare Other | Attending: Internal Medicine | Admitting: Internal Medicine

## 2020-12-14 ENCOUNTER — Ambulatory Visit: Admission: RE | Admit: 2020-12-14 | Payer: Medicare Other | Source: Ambulatory Visit

## 2020-12-14 ENCOUNTER — Other Ambulatory Visit: Payer: Self-pay

## 2020-12-14 ENCOUNTER — Other Ambulatory Visit: Payer: Medicare Other

## 2020-12-14 DIAGNOSIS — Z8349 Family history of other endocrine, nutritional and metabolic diseases: Secondary | ICD-10-CM

## 2020-12-14 DIAGNOSIS — K5730 Diverticulosis of large intestine without perforation or abscess without bleeding: Secondary | ICD-10-CM | POA: Diagnosis present

## 2020-12-14 DIAGNOSIS — Z8249 Family history of ischemic heart disease and other diseases of the circulatory system: Secondary | ICD-10-CM

## 2020-12-14 DIAGNOSIS — Z95810 Presence of automatic (implantable) cardiac defibrillator: Secondary | ICD-10-CM

## 2020-12-14 DIAGNOSIS — I959 Hypotension, unspecified: Secondary | ICD-10-CM | POA: Diagnosis present

## 2020-12-14 DIAGNOSIS — D62 Acute posthemorrhagic anemia: Secondary | ICD-10-CM | POA: Diagnosis present

## 2020-12-14 DIAGNOSIS — K5731 Diverticulosis of large intestine without perforation or abscess with bleeding: Principal | ICD-10-CM | POA: Diagnosis present

## 2020-12-14 DIAGNOSIS — Z888 Allergy status to other drugs, medicaments and biological substances status: Secondary | ICD-10-CM

## 2020-12-14 DIAGNOSIS — I428 Other cardiomyopathies: Secondary | ICD-10-CM | POA: Diagnosis present

## 2020-12-14 DIAGNOSIS — Z7901 Long term (current) use of anticoagulants: Secondary | ICD-10-CM

## 2020-12-14 DIAGNOSIS — R748 Abnormal levels of other serum enzymes: Secondary | ICD-10-CM | POA: Diagnosis present

## 2020-12-14 DIAGNOSIS — Z87442 Personal history of urinary calculi: Secondary | ICD-10-CM | POA: Diagnosis not present

## 2020-12-14 DIAGNOSIS — K5710 Diverticulosis of small intestine without perforation or abscess without bleeding: Secondary | ICD-10-CM | POA: Diagnosis present

## 2020-12-14 DIAGNOSIS — N1831 Chronic kidney disease, stage 3a: Secondary | ICD-10-CM | POA: Diagnosis not present

## 2020-12-14 DIAGNOSIS — G2581 Restless legs syndrome: Secondary | ICD-10-CM | POA: Diagnosis present

## 2020-12-14 DIAGNOSIS — K635 Polyp of colon: Secondary | ICD-10-CM | POA: Diagnosis not present

## 2020-12-14 DIAGNOSIS — I130 Hypertensive heart and chronic kidney disease with heart failure and stage 1 through stage 4 chronic kidney disease, or unspecified chronic kidney disease: Secondary | ICD-10-CM | POA: Diagnosis present

## 2020-12-14 DIAGNOSIS — I2510 Atherosclerotic heart disease of native coronary artery without angina pectoris: Secondary | ICD-10-CM | POA: Diagnosis not present

## 2020-12-14 DIAGNOSIS — I5022 Chronic systolic (congestive) heart failure: Secondary | ICD-10-CM | POA: Diagnosis present

## 2020-12-14 DIAGNOSIS — E039 Hypothyroidism, unspecified: Secondary | ICD-10-CM | POA: Diagnosis present

## 2020-12-14 DIAGNOSIS — K922 Gastrointestinal hemorrhage, unspecified: Secondary | ICD-10-CM | POA: Insufficient documentation

## 2020-12-14 DIAGNOSIS — Z9981 Dependence on supplemental oxygen: Secondary | ICD-10-CM | POA: Diagnosis not present

## 2020-12-14 DIAGNOSIS — Z881 Allergy status to other antibiotic agents status: Secondary | ICD-10-CM | POA: Diagnosis not present

## 2020-12-14 DIAGNOSIS — Z880 Allergy status to penicillin: Secondary | ICD-10-CM

## 2020-12-14 DIAGNOSIS — J449 Chronic obstructive pulmonary disease, unspecified: Secondary | ICD-10-CM | POA: Diagnosis not present

## 2020-12-14 DIAGNOSIS — K449 Diaphragmatic hernia without obstruction or gangrene: Secondary | ICD-10-CM | POA: Diagnosis present

## 2020-12-14 DIAGNOSIS — F411 Generalized anxiety disorder: Secondary | ICD-10-CM | POA: Diagnosis present

## 2020-12-14 DIAGNOSIS — Z79890 Hormone replacement therapy (postmenopausal): Secondary | ICD-10-CM

## 2020-12-14 DIAGNOSIS — Z7951 Long term (current) use of inhaled steroids: Secondary | ICD-10-CM

## 2020-12-14 DIAGNOSIS — Z8619 Personal history of other infectious and parasitic diseases: Secondary | ICD-10-CM

## 2020-12-14 DIAGNOSIS — Z87891 Personal history of nicotine dependence: Secondary | ICD-10-CM

## 2020-12-14 DIAGNOSIS — J9611 Chronic respiratory failure with hypoxia: Secondary | ICD-10-CM | POA: Diagnosis not present

## 2020-12-14 DIAGNOSIS — Z20822 Contact with and (suspected) exposure to covid-19: Secondary | ICD-10-CM | POA: Diagnosis not present

## 2020-12-14 DIAGNOSIS — Z8261 Family history of arthritis: Secondary | ICD-10-CM

## 2020-12-14 DIAGNOSIS — I482 Chronic atrial fibrillation, unspecified: Secondary | ICD-10-CM | POA: Diagnosis present

## 2020-12-14 DIAGNOSIS — Z79899 Other long term (current) drug therapy: Secondary | ICD-10-CM

## 2020-12-14 DIAGNOSIS — D500 Iron deficiency anemia secondary to blood loss (chronic): Secondary | ICD-10-CM | POA: Diagnosis not present

## 2020-12-14 DIAGNOSIS — Z833 Family history of diabetes mellitus: Secondary | ICD-10-CM

## 2020-12-14 DIAGNOSIS — I10 Essential (primary) hypertension: Secondary | ICD-10-CM | POA: Diagnosis not present

## 2020-12-14 LAB — CBC WITH DIFFERENTIAL/PLATELET
Basophils Absolute: 0.1 10*3/uL (ref 0.0–0.2)
Basos: 1 %
EOS (ABSOLUTE): 0.1 10*3/uL (ref 0.0–0.4)
Eos: 2 %
Hematocrit: 23.8 % — ABNORMAL LOW (ref 34.0–46.6)
Hemoglobin: 7 g/dL — CL (ref 11.1–15.9)
Immature Grans (Abs): 0.1 10*3/uL (ref 0.0–0.1)
Immature Granulocytes: 1 %
Lymphocytes Absolute: 1.7 10*3/uL (ref 0.7–3.1)
Lymphs: 30 %
MCH: 25.9 pg — ABNORMAL LOW (ref 26.6–33.0)
MCHC: 29.4 g/dL — ABNORMAL LOW (ref 31.5–35.7)
MCV: 88 fL (ref 79–97)
Monocytes Absolute: 0.5 10*3/uL (ref 0.1–0.9)
Monocytes: 9 %
NRBC: 1 % — ABNORMAL HIGH (ref 0–0)
Neutrophils Absolute: 3.2 10*3/uL (ref 1.4–7.0)
Neutrophils: 57 %
Platelets: 265 10*3/uL (ref 150–450)
RBC: 2.7 x10E6/uL — CL (ref 3.77–5.28)
RDW: 15.7 % — ABNORMAL HIGH (ref 11.7–15.4)
WBC: 5.7 10*3/uL (ref 3.4–10.8)

## 2020-12-14 LAB — COMPREHENSIVE METABOLIC PANEL
ALT: 15 IU/L (ref 0–32)
ALT: 18 U/L (ref 0–44)
AST: 22 IU/L (ref 0–40)
AST: 22 U/L (ref 15–41)
Albumin/Globulin Ratio: 1.1 — ABNORMAL LOW (ref 1.2–2.2)
Albumin: 3.5 g/dL — ABNORMAL LOW (ref 3.8–4.8)
Albumin: 3 g/dL — ABNORMAL LOW (ref 3.5–5.0)
Alkaline Phosphatase: 88 IU/L (ref 44–121)
Alkaline Phosphatase: 71 U/L (ref 38–126)
Anion gap: 9 (ref 5–15)
BUN/Creatinine Ratio: 18 (ref 12–28)
BUN: 30 mg/dL — ABNORMAL HIGH (ref 8–27)
BUN: 38 mg/dL — ABNORMAL HIGH (ref 8–23)
Bilirubin Total: 0.6 mg/dL (ref 0.0–1.2)
CO2: 33 mmol/L — ABNORMAL HIGH (ref 20–29)
CO2: 34 mmol/L — ABNORMAL HIGH (ref 22–32)
Calcium: 9 mg/dL (ref 8.7–10.3)
Calcium: 8.6 mg/dL — ABNORMAL LOW (ref 8.9–10.3)
Chloride: 90 mmol/L — ABNORMAL LOW (ref 96–106)
Chloride: 93 mmol/L — ABNORMAL LOW (ref 98–111)
Creatinine, Ser: 1.67 mg/dL — ABNORMAL HIGH (ref 0.57–1.00)
Creatinine, Ser: 1.59 mg/dL — ABNORMAL HIGH (ref 0.44–1.00)
GFR, Estimated: 35 mL/min — ABNORMAL LOW (ref >60)
Globulin, Total: 3.1 g/dL (ref 1.5–4.5)
Glucose, Bld: 91 mg/dL (ref 70–99)
Glucose: 99 mg/dL (ref 70–99)
Potassium: 4.1 mmol/L (ref 3.5–5.2)
Potassium: 4 mmol/L (ref 3.5–5.1)
Sodium: 136 mmol/L (ref 134–144)
Sodium: 136 mmol/L (ref 135–145)
Total Bilirubin: 0.9 mg/dL (ref 0.3–1.2)
Total Protein: 6.6 g/dL (ref 6.0–8.5)
Total Protein: 6.7 g/dL (ref 6.5–8.1)
eGFR: 33 mL/min/{1.73_m2} — ABNORMAL LOW (ref >59)

## 2020-12-14 LAB — CBC
HCT: 22.3 % — ABNORMAL LOW (ref 36.0–46.0)
Hemoglobin: 6.6 g/dL — ABNORMAL LOW (ref 12.0–15.0)
MCH: 25.9 pg — ABNORMAL LOW (ref 26.0–34.0)
MCHC: 29.6 g/dL — ABNORMAL LOW (ref 30.0–36.0)
MCV: 87.5 fL (ref 80.0–100.0)
Platelets: 256 10*3/uL (ref 150–400)
RBC: 2.55 MIL/uL — ABNORMAL LOW (ref 3.87–5.11)
RDW: 17.2 % — ABNORMAL HIGH (ref 11.5–15.5)
WBC: 6.1 10*3/uL (ref 4.0–10.5)
nRBC: 0 % (ref 0.0–0.2)

## 2020-12-14 LAB — PROTIME-INR
INR: 1.9 — ABNORMAL HIGH (ref 0.8–1.2)
Prothrombin Time: 21.4 seconds — ABNORMAL HIGH (ref 11.4–15.2)

## 2020-12-14 LAB — DIGOXIN LEVEL: Digoxin Level: 1.3 ng/mL (ref 0.8–2.0)

## 2020-12-14 LAB — PREPARE RBC (CROSSMATCH)

## 2020-12-14 LAB — HEMOGLOBIN AND HEMATOCRIT, BLOOD
HCT: 24 % — ABNORMAL LOW (ref 36.0–46.0)
Hemoglobin: 7.3 g/dL — ABNORMAL LOW (ref 12.0–15.0)

## 2020-12-14 MED ORDER — ONDANSETRON HCL 4 MG PO TABS
4.0000 mg | ORAL_TABLET | Freq: Four times a day (QID) | ORAL | Status: DC | PRN
  Administered 2020-12-16: 4 mg via ORAL
  Filled 2020-12-14: qty 1

## 2020-12-14 MED ORDER — DIGOXIN 125 MCG PO TABS
0.0625 mg | ORAL_TABLET | Freq: Every day | ORAL | Status: DC
  Administered 2020-12-14 – 2020-12-18 (×3): 0.0625 mg via ORAL
  Filled 2020-12-14 (×5): qty 0.5

## 2020-12-14 MED ORDER — PEG 3350-KCL-NA BICARB-NACL 420 G PO SOLR
4000.0000 mL | Freq: Once | ORAL | Status: AC
  Administered 2020-12-14: 4000 mL via ORAL
  Filled 2020-12-14: qty 4000

## 2020-12-14 MED ORDER — CARVEDILOL 6.25 MG PO TABS: 6.2500 mg | ORAL_TABLET | Freq: Two times a day (BID) | ORAL | Status: DC

## 2020-12-14 MED ORDER — UMECLIDINIUM BROMIDE 62.5 MCG/ACT IN AEPB
1.0000 | INHALATION_SPRAY | Freq: Every day | RESPIRATORY_TRACT | Status: DC
  Administered 2020-12-15 – 2020-12-18 (×4): 1 via RESPIRATORY_TRACT
  Filled 2020-12-14: qty 7

## 2020-12-14 MED ORDER — ACETAMINOPHEN 650 MG RE SUPP: 650.0000 mg | Freq: Four times a day (QID) | RECTAL | Status: DC | PRN

## 2020-12-14 MED ORDER — SODIUM CHLORIDE 0.9 % IV SOLN
10.0000 mL/h | Freq: Once | INTRAVENOUS | Status: AC
  Administered 2020-12-14: 10 mL/h via INTRAVENOUS

## 2020-12-14 MED ORDER — SODIUM CHLORIDE 0.9% FLUSH: 3.0000 mL | INTRAVENOUS | Status: DC | PRN

## 2020-12-14 MED ORDER — ACETAMINOPHEN 325 MG PO TABS
650.0000 mg | ORAL_TABLET | Freq: Four times a day (QID) | ORAL | Status: DC | PRN
  Administered 2020-12-16: 650 mg via ORAL

## 2020-12-14 MED ORDER — AMIODARONE HCL 200 MG PO TABS
100.0000 mg | ORAL_TABLET | Freq: Every day | ORAL | Status: DC
  Administered 2020-12-14 – 2020-12-18 (×4): 100 mg via ORAL
  Filled 2020-12-14: qty 0.5
  Filled 2020-12-14 (×3): qty 1
  Filled 2020-12-14: qty 0.5

## 2020-12-14 MED ORDER — PANTOPRAZOLE SODIUM 40 MG IV SOLR
40.0000 mg | INTRAVENOUS | Status: DC
  Administered 2020-12-15 – 2020-12-17 (×3): 40 mg via INTRAVENOUS
  Filled 2020-12-14 (×3): qty 40

## 2020-12-14 MED ORDER — SODIUM CHLORIDE 0.9% FLUSH
3.0000 mL | Freq: Two times a day (BID) | INTRAVENOUS | Status: DC
  Administered 2020-12-15 – 2020-12-16 (×4): 3 mL via INTRAVENOUS

## 2020-12-14 MED ORDER — ONDANSETRON HCL 4 MG/2ML IJ SOLN
4.0000 mg | Freq: Four times a day (QID) | INTRAMUSCULAR | Status: DC | PRN
  Administered 2020-12-14 – 2020-12-15 (×2): 4 mg via INTRAVENOUS
  Filled 2020-12-14 (×2): qty 2

## 2020-12-14 MED ORDER — CALCITRIOL 0.25 MCG PO CAPS
0.2500 ug | ORAL_CAPSULE | Freq: Every day | ORAL | Status: DC
  Administered 2020-12-14 – 2020-12-18 (×4): 0.25 ug via ORAL
  Filled 2020-12-14 (×5): qty 1

## 2020-12-14 MED ORDER — GABAPENTIN 100 MG PO CAPS
100.0000 mg | ORAL_CAPSULE | Freq: Every day | ORAL | Status: DC
  Administered 2020-12-15 – 2020-12-16 (×2): 100 mg via ORAL
  Filled 2020-12-14 (×4): qty 1

## 2020-12-14 MED ORDER — FLUTICASONE PROPIONATE 50 MCG/ACT NA SUSP
2.0000 | Freq: Every day | NASAL | Status: DC
  Administered 2020-12-16: 2 via NASAL
  Filled 2020-12-14: qty 16

## 2020-12-14 MED ORDER — LEVOTHYROXINE SODIUM 50 MCG PO TABS
175.0000 ug | ORAL_TABLET | Freq: Every day | ORAL | Status: DC
  Administered 2020-12-16 – 2020-12-18 (×3): 175 ug via ORAL
  Filled 2020-12-14 (×2): qty 1

## 2020-12-14 MED ORDER — TORSEMIDE 20 MG PO TABS: 20.0000 mg | ORAL_TABLET | Freq: Every day | ORAL | Status: DC

## 2020-12-14 MED ORDER — TORSEMIDE 20 MG PO TABS
20.0000 mg | ORAL_TABLET | Freq: Every day | ORAL | Status: DC
  Administered 2020-12-17 – 2020-12-18 (×2): 20 mg via ORAL
  Filled 2020-12-14 (×2): qty 1

## 2020-12-14 MED ORDER — SPIRONOLACTONE 25 MG PO TABS
12.5000 mg | ORAL_TABLET | Freq: Every day | ORAL | Status: DC
  Administered 2020-12-17 – 2020-12-18 (×2): 12.5 mg via ORAL
  Filled 2020-12-14: qty 1
  Filled 2020-12-14: qty 0.5
  Filled 2020-12-14: qty 1
  Filled 2020-12-14 (×3): qty 0.5

## 2020-12-14 MED ORDER — SPIRONOLACTONE 25 MG PO TABS: 12.5000 mg | ORAL_TABLET | Freq: Every day | ORAL | Status: DC

## 2020-12-14 MED ORDER — PANTOPRAZOLE SODIUM 40 MG IV SOLR
40.0000 mg | Freq: Once | INTRAVENOUS | Status: AC
  Administered 2020-12-14: 40 mg via INTRAVENOUS
  Filled 2020-12-14: qty 40

## 2020-12-14 MED ORDER — ALBUTEROL SULFATE (2.5 MG/3ML) 0.083% IN NEBU: 3.0000 mL | INHALATION_SOLUTION | Freq: Four times a day (QID) | RESPIRATORY_TRACT | Status: DC | PRN

## 2020-12-14 MED ORDER — FLUTICASONE-UMECLIDIN-VILANT 100-62.5-25 MCG/ACT IN AEPB: 1.0000 | INHALATION_SPRAY | Freq: Every day | RESPIRATORY_TRACT | Status: DC

## 2020-12-14 MED ORDER — MIDODRINE HCL 5 MG PO TABS
5.0000 mg | ORAL_TABLET | Freq: Three times a day (TID) | ORAL | Status: DC
  Administered 2020-12-14 – 2020-12-18 (×7): 5 mg via ORAL
  Filled 2020-12-14 (×8): qty 1

## 2020-12-14 MED ORDER — SODIUM CHLORIDE 0.9 % IV SOLN
250.0000 mL | INTRAVENOUS | Status: DC | PRN
  Administered 2020-12-15: 11:00:00 1000 mL via INTRAVENOUS
  Administered 2020-12-15: 04:00:00 250 mL via INTRAVENOUS

## 2020-12-14 MED ORDER — FLUTICASONE FUROATE-VILANTEROL 100-25 MCG/ACT IN AEPB
1.0000 | INHALATION_SPRAY | Freq: Every day | RESPIRATORY_TRACT | Status: DC
  Administered 2020-12-16 – 2020-12-18 (×3): 1 via RESPIRATORY_TRACT
  Filled 2020-12-14: qty 28

## 2020-12-14 NOTE — ED Triage Notes (Addendum)
Pt to ED for low hgb of 7, labs drawn yesterday. Reports  has had dark stools x3 days.  NAD noted

## 2020-12-14 NOTE — ED Notes (Signed)
pt'sHR is 71, and BP is 106/87, contacted dr. Tobie Neal about whether pt. Should receive  ordered digoxin and amioderone.

## 2020-12-14 NOTE — Progress Notes (Signed)
Cross Coverage Note[facility] - No charge  Received message from nursing staff regarding patient 106/57 and HR of 71 regarding digoxin, amiodarone, coreg.   Per HPI, patient has chronic atrial fibrillation - patient on digoxin, amiodarone, and carvediolol for rate control - Coreg has been discontinued due to soft BP and discussed with RN staff  Dr. Tobie Poet

## 2020-12-14 NOTE — ED Notes (Signed)
Dr. Joni Fears at bedside with pt. Discussing risks and benefits of blood transfusion.

## 2020-12-14 NOTE — ED Notes (Signed)
This RN has communicated to Dr. Tobie Poet pt's VS of HR 70, and BP was 106/87 and that that BP is the highest she's had in the last little bit, and asked whether amiodarone should be given. Dr. Tobie Poet replied to give amiodarone, and hold digoxin pending dig. Level. Dr. Tobie Poet also notified this RN that zofran could be given to pt. Despite prolongation warning.

## 2020-12-14 NOTE — ED Notes (Signed)
Notified Dr. Tobie Poet pt's dig level is 1.3, pt's repeat hgb is 7.3

## 2020-12-14 NOTE — ED Notes (Signed)
Per Dr. Tobie Poet, This pt. Is to receive digoxin as ordered. This RN has made  Dr. Tobie Poet aware of pt's low BP and HR of 70's, that she is light-headed when she stands, and shaky. This RN has expressed concern about several medications ordered with potential to lower pt's BP and HR. See orders/MAR for adjustments/response.

## 2020-12-14 NOTE — H&P (Signed)
History and Physical    [name] KKX:[id number] DOB: [birth year] DOA: 12/14/2020  PCP: Jon Billings, NP   Patient coming from: Home  I have personally briefly reviewed patient's old medical records in [place]  Chief Complaint: Weakness  HPI: Maria Neal is a 70 y.o. 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 Patient complains of feeling very weak and fatigued and had a fall about 2 weeks prior to hospitalization.  She denied having any loss of consciousness.  She has occasional stools containing blood and sometimes her stools are brown in color.  She denies having any hematemesis or having any melena stools.  She denies having abdominal pain or hematemesis.  She does not use NSAIDs. She denies having any chest pain, no shortness of breath, no nausea, no vomiting, no dizziness, no lightheadedness, no urinary frequency, no nocturia, no dysuria, no cough, no fever, no chills, no blurred vision no focal deficit. Sodium 136, potassium 4.0, chloride 93, bicarb 34, glucose 91, BUN 38, creatinine 1.58, calcium 8.6, alkaline phosphatase 71, albumin 3.0, AST 22, ALT 18, total protein 6.7, white count 6.1, hemoglobin 6.6 down from 9.5 a month ago, hematocrit 22.3, MCV 87.5, RDW 17.7, platelet count 256, PT 21.4, INR 1.9  ED Course: Patient is a 70 year old female who presents to the ER at the request of her primary care provider for evaluation of symptomatic acute blood loss anemia. Patient's baseline hemoglobin is around 9.5 g/dlg and today on admission it is 6.6g/dl. Patient received a unit of packed RBC in the ER Gastroenterology has been consulted Patient will be  referred to observation status for further evaluation.    Review of Systems: As per HPI otherwise all other systems reviewed and negative.    Past Medical History:  Diagnosis Date   Acute appendicitis 02/17/2018   AICD (automatic cardioverter/defibrillator) present    Anxiety state    Arrhythmia    atrial fibrillation   CAD (coronary artery disease)    Cardiomyopathy ([place])    CHF (congestive heart failure) (HCC)    COPD (chronic obstructive pulmonary disease) ([place])    Dyspnea    Headache    History of kidney stones    History of shingles    Hypertension    On home oxygen therapy    bedtime and prn   Restless leg syndrome     Past Surgical History:  Procedure Laterality Date   CARDIAC CATHETERIZATION  04/2014   Dr. Idelle Leech   CARDIAC CATHETERIZATION     CARDIAC DEFIBRILLATOR PLACEMENT     CARDIOVERSION N/A 08/11/2020   Procedure: CARDIOVERSION;  Surgeon: Dixie Dials, MD;  Location: [facility] ENDOSCOPY;  Service: Cardiovascular;  Laterality: N/A;   CHOLECYSTECTOMY N/A 08/23/2016   Procedure: LAPAROSCOPIC CHOLECYSTECTOMY;  Surgeon: Clayburn Pert, MD;  Location: [facility] ORS;  Service: General;  Laterality: N/A;   [place] N/A 06/27/2018   Procedure: DILATATION & CURETTAGE/HYSTEROSCOPY WITH MYOSURE;  Surgeon: Malachy Mood, MD;  Location: [facility] ORS;  Service: Gynecology;  Laterality: N/A;   HERNIA REPAIR     LAPAROSCOPIC APPENDECTOMY N/A 02/17/2018   Procedure: APPENDECTOMY LAPAROSCOPIC;  Surgeon: Benjamine Sprague, DO;  Location: [facility] ORS;  Service: General;  Laterality: N/A;  PPM GENERATOR CHANGEOUT N/A 09/14/2020   Procedure: PPM GENERATOR CHANGEOUT;  Surgeon: Isaias Cowman, MD;  Location: [place] CV LAB;  Service: Cardiovascular;  Laterality: N/A;   TEE WITHOUT CARDIOVERSION N/A 08/10/2020   Procedure: TRANSESOPHAGEAL ECHOCARDIOGRAM (TEE);  Surgeon: Dixie Dials, MD;  Location: [facility] ENDOSCOPY;  Service: Cardiovascular;   Laterality: N/A;     reports that she has quit smoking. Her smoking use included cigarettes. She smoked an average of .25 packs per day. She has never used smokeless tobacco. She reports that she does not drink alcohol and does not use drugs.  Allergies  Allergen Reactions   Levaquin [Levofloxacin] Anaphylaxis   Amoxicillin Hives    Did it involve swelling of the face/tongue/throat, SOB, or low BP? No Did it involve sudden or severe rash/hives, skin peeling, or any reaction on the inside of your mouth or nose? No Did you need to seek medical attention at a hospital or doctor's office? No When did it last happen?      10+ years If all above answers are "NO", may proceed with cephalosporin use.   Nitrofuran Derivatives Other (See Comments)    Unknown   Penicillins Other (See Comments)    Thinks it made her itch a lot, but isn't sure. Did it involve swelling of the face/tongue/throat, SOB, or low BP? No Did it involve sudden or severe rash/hives, skin peeling, or any reaction on the inside of your mouth or nose? No Did you need to seek medical attention at a hospital or doctor's office? No When did it last happen?      10+ years If all above answers are "NO", may proceed with cephalosporin use.    Zithromax [Azithromycin] Other (See Comments)   Antihistamines, Chlorpheniramine-Type Rash    Family History  Problem Relation Age of Onset   Diabetes Mellitus II Mother    Hypertension Mother    Heart attack Father    Arthritis Sister    Arthritis Sister    Thyroid disease Brother    Heart disease Brother    Diabetes Brother    Depression Daughter    Depression Son    Schizophrenia Son    Breast cancer Neg Hx       Prior to Admission medications   Medication Sig Start Date End Date Taking? Authorizing Provider  albuterol (PROVENTIL HFA;VENTOLIN HFA) 108 (90 Base) MCG/ACT inhaler Inhale 2 puffs into the lungs every 6 (six) hours as needed for wheezing or shortness of breath.     [provider]  alum & mag hydroxide-simeth (MAALOX/MYLANTA) 200-200-20 MG/5ML suspension Take 30 mLs by mouth every 4 (four) hours as needed for indigestion or heartburn. 08/13/20   Dixie Dials, MD  amiodarone (PACERONE) 200 MG tablet Take 1 tablet (200 mg total) by mouth daily. Patient taking differently: Take 100 mg by mouth daily. 08/13/20   Dixie Dials, MD  apixaban (ELIQUIS) 5 MG TABS tablet Take 1 tablet (5 mg total) by mouth 2 (two) times daily. 08/13/20   Dixie Dials, MD  calcitRIOL (ROCALTROL) 0.25 MCG capsule Take 0.25 mcg by mouth daily. 11/12/20   [provider]  carvedilol (COREG) 6.25 MG tablet Take 6.25 mg by mouth 2 (two) times daily. 12/06/20   [provider]  digoxin 62.5 MCG TABS Take 0.0625 mg by mouth daily. 08/14/20   Dixie Dials, MD  fluticasone (FLONASE) 50 MCG/ACT nasal spray Place 2 sprays into both nostrils daily. 07/26/18   Volney American, PA-C  Fluticasone-Umeclidin-Vilant (TRELEGY ELLIPTA) 100-62.5-25  MCG/INH AEPB Inhale 1 puff into the lungs daily. 02/12/20   [provider]  gabapentin (NEURONTIN) 100 MG capsule Take 1 capsule (100 mg total) by mouth at bedtime. 08/13/20   Dixie Dials, MD  levothyroxine (SYNTHROID) 175 MCG tablet Take 1 tablet (175 mcg total) by mouth daily. 12/10/20   Renato Shin, MD  midodrine (PROAMATINE) 5 MG tablet Take 1 tablet (5 mg total) by mouth 3 (three) times daily with meals. 12/06/20   Jon Billings, NP  nystatin cream (MYCOSTATIN) Apply 1 application topically 2 (two) times daily. 09/23/20   Jon Billings, NP  OXYGEN Inhale 3 L into the lungs 3 (three) times daily as needed (shortness of breath or coughing).    [provider]  spironolactone (ALDACTONE) 25 MG tablet Take 12.5 mg by mouth daily. 04/09/17   [provider]  torsemide (DEMADEX) 20 MG tablet Take 20 mg by mouth daily.    [provider]  triamcinolone (KENALOG) 0.025 % ointment Apply 1  application topically 2 (two) times daily. 12/13/20   Jon Billings, NP    Physical Exam: Vitals:   12/14/20 1425 12/14/20 1430 12/14/20 1435 12/14/20 1450  BP:  (!) 106/46  (!) 101/41  Pulse: 70 69 70 71  Resp: 14 14 18  (!) 21  Temp:    98 F (36.7 C)  TempSrc:    Oral  SpO2: 100% 100% 100% 100%  Weight:      Height:         Vitals:   12/14/20 1425 12/14/20 1430 12/14/20 1435 12/14/20 1450  BP:  (!) 106/46  (!) 101/41  Pulse: 70 69 70 71  Resp: 14 14 18  (!) 21  Temp:    98 F (36.7 C)  TempSrc:    Oral  SpO2: 100% 100% 100% 100%  Weight:      Height:          Constitutional: Alert and oriented x 3 . Not in any apparent distress.  Generalized pallor.  Chronically ill-appearing HEENT:      Head: Normocephalic and atraumatic.         Eyes: PERLA, EOMI, Conjunctivae pallor. Sclera is non-icteric.       Mouth/Throat: Mucous membranes are moist.       Neck: Supple with no signs of meningismus. Cardiovascular: Regular rate and rhythm. No murmurs, gallops, or rubs. 2+ symmetrical distal pulses are present . No JVD. No LE edema Respiratory: Respiratory effort normal .Lungs sounds clear bilaterally. No wheezes, crackles, or rhonchi.  Gastrointestinal: Soft, non tender, and non distended with positive bowel sounds.  Genitourinary: No CVA tenderness. Musculoskeletal: Nontender with normal range of motion in all extremities. No cyanosis, or erythema of extremities. Neurologic:  Face is symmetric. Moving all extremities. No gross focal neurologic deficits .  Generalized weakness Skin: Skin is warm, dry.  No rash or ulcers Psychiatric: Mood and affect are normal    Labs on Admission: I have personally reviewed following labs and imaging studies  CBC: Recent Labs  Lab 12/13/20 1354 12/14/20 1208  WBC 5.7 6.1  NEUTROABS 3.2  --   HGB 7.0* 6.6*  HCT 23.8* 22.3*  MCV 88 87.5  PLT 265 284   Basic Metabolic Panel: Recent Labs  Lab 12/13/20 1354 12/14/20 1208  NA  136 136  K 4.1 4.0  CL 90* 93*  CO2 33* 34*  GLUCOSE 99 91  BUN 30* 38*  CREATININE 1.67* 1.59*  CALCIUM 9.0 8.6*   GFR: Estimated Creatinine Clearance:  31.2 mL/min (A) (by C-G formula based on SCr of 1.59 mg/dL (H)). Liver Function Tests: Recent Labs  Lab 12/13/20 1354 12/14/20 1208  AST 22 22  ALT 15 18  ALKPHOS 88 71  BILITOT 0.6 0.9  PROT 6.6 6.7  ALBUMIN 3.5* 3.0*   No results for input(s): LIPASE, AMYLASE in the last 168 hours. No results for input(s): AMMONIA in the last 168 hours. Coagulation Profile: Recent Labs  Lab 12/14/20 1208  INR 1.9*   Cardiac Enzymes: No results for input(s): CKTOTAL, CKMB, CKMBINDEX, TROPONINI in the last 168 hours. BNP (last 3 results) No results for input(s): PROBNP in the last 8760 hours. HbA1C: No results for input(s): HGBA1C in the last 72 hours. CBG: No results for input(s): GLUCAP in the last 168 hours. Lipid Profile: No results for input(s): CHOL, HDL, LDLCALC, TRIG, CHOLHDL, LDLDIRECT in the last 72 hours. Thyroid Function Tests: No results for input(s): TSH, T4TOTAL, FREET4, T3FREE, THYROIDAB in the last 72 hours. Anemia Panel: No results for input(s): VITAMINB12, FOLATE, FERRITIN, TIBC, IRON, RETICCTPCT in the last 72 hours. Urine analysis:    Component Value Date/Time   COLORURINE YELLOW (A) 10/06/2018 1737   APPEARANCEUR Clear 11/11/2020 1024   LABSPEC 1.012 10/06/2018 1737   LABSPEC 1.021 09/21/2012 1516   PHURINE 6.0 10/06/2018 1737   GLUCOSEU Negative 11/11/2020 1024   GLUCOSEU Negative 09/21/2012 1516   HGBUR NEGATIVE 10/06/2018 1737   BILIRUBINUR Negative 11/11/2020 1024   BILIRUBINUR Negative 09/21/2012 1516   KETONESUR NEGATIVE 10/06/2018 1737   PROTEINUR Negative 11/11/2020 1024   PROTEINUR NEGATIVE 10/06/2018 1737   NITRITE Negative 11/11/2020 1024   NITRITE NEGATIVE 10/06/2018 1737   LEUKOCYTESUR 1+ (A) 11/11/2020 1024   LEUKOCYTESUR TRACE (A) 10/06/2018 1737   LEUKOCYTESUR Negative 09/21/2012  1516    Radiological Exams on Admission: No results found.   Assessment/Plan Principal Problem:   Acute blood loss anemia (ABLA) Active Problems:   CAD (coronary artery disease)   Chronic systolic congestive heart failure, NYHA class 3 (HCC)   NICM (nonischemic cardiomyopathy) (HCC)   Chronic kidney disease, stage 3a (HCC)   Hypothyroidism   Chronic respiratory failure with hypoxia (HCC) - 3 L/min   Chronic a-fib ([place])     Patient is a 70 year old female admitted to the hospital for symptomatic anemia.   Acute blood loss anemia From gastrointestinal source.  Patient has had maroon-colored stools Patient has a baseline hemoglobin of 9.5g/dl and today on admission hemoglobin is 6.6g/dl Will transfuse 1 unit of packed RBC Check serial H&H Continue IV Protonix We will request gastroenterology consult     Chronic atrial fibrillation Continue digoxin, amiodarone and carvedilol for rate control Hold apixaban due to acute blood loss anemia Check digoxin levels    Chronic systolic heart failure Not acutely exacerbated Continue carvedilol, spironolactone and Demadex Patient not on an ACE inhibitor due to relative hypotension    COPD with chronic respiratory failure Not acutely exacerbated Continue 3 L of oxygen to maintain pulse oximetry greater than 94% Continue as needed bronchodilator therapy as well as inhaled steroids    Nonischemic cardiomyopathy Last known LVEF is 20 to 25% from a 2D echocardiogram that was done 08/22 Patient is status post AICD insertion  DVT prophylaxis: SCD  Code Status: full code  Family Communication: Greater than 50% of time was spent discussing patient's condition and plan of care with patient at the bedside.  All questions and concerns have been addressed.  She verbalizes understanding and agrees with the  plan. Disposition Plan: Back to previous home environment Consults called: Gastroenterology Status: Observation    Kennedey Digilio MD [facility]     12/14/2020, 3:19 PM

## 2020-12-14 NOTE — ED Notes (Signed)
Pt. Up to bathroom with stand by assist.

## 2020-12-14 NOTE — ED Notes (Signed)
Hospitalist at bedside 

## 2020-12-14 NOTE — ED Notes (Signed)
pt'sHR is 71, and BP is 106/87, contacted dr. Francine Graven about whether pt. Should receive  ordered digoxin and amioderone.

## 2020-12-14 NOTE — Consult Note (Signed)
Lucilla Lame, MD [facility]  [address] [place], [place] [address] Phone: [phone] Fax : [phone]  Consultation  Referring Provider:     Dr. Francine Graven Primary Care Physician:  Jon Billings, NP Primary Gastroenterologist:  Dr. Allen Norris          Reason for Consultation:     GI bleed  Date of Admission:  12/14/2020 Date of Consultation:  12/14/2020         HPI:   Maria Neal is a 70 y.o. female who was having some fatigue and dark stools the last few days but reports actually that her last dark stool was 3 days ago.  The patient was noted to have abnormal liver enzymes at her doctor's office yesterday and was told to come to the emergency room.  The patient had a level of her hemoglobin to be 7.  Her baseline is usually around 9.  The patient denies any abdominal pain nausea vomiting fevers chills black stools or bloody stools the ER doctor did a rectal exam and states that the stools were more of a maroon color.  The patient is on Eliquis but she states that she is not sure why she is on it but she does have a history of A. fib.  The patient saw me for abdominal pain in 2020.  She also reports that she has never had a colonoscopy.  She does not take any anti-inflammatory medications.  Patient's labs showed her INR being 1.9 with a repeat hemoglobin in today of 6.6 down from 7.0.  The patient has been ordered blood and is receiving a unit as I am seeing her today.  Her liver enzymes are not elevated.  She is not reporting any abdominal pain now and states that she feels a little weak but without any dizziness or shortness of breath.  She reports that she has chronic renal insufficiency.  Past Medical History:  Diagnosis Date   Acute appendicitis 02/17/2018   AICD (automatic cardioverter/defibrillator) present    Anxiety state    Arrhythmia    atrial fibrillation   CAD (coronary artery disease)    Cardiomyopathy ([place])    CHF (congestive heart failure) (HCC)    COPD  (chronic obstructive pulmonary disease) ([place])    Dyspnea    Headache    History of kidney stones    History of shingles    Hypertension    On home oxygen therapy    bedtime and prn   Restless leg syndrome     Past Surgical History:  Procedure Laterality Date   CARDIAC CATHETERIZATION  04/2014   Dr. Idelle Leech   CARDIAC CATHETERIZATION     CARDIAC DEFIBRILLATOR PLACEMENT     CARDIOVERSION N/A 08/11/2020   Procedure: CARDIOVERSION;  Surgeon: Dixie Dials, MD;  Location: [facility] ENDOSCOPY;  Service: Cardiovascular;  Laterality: N/A;   CHOLECYSTECTOMY N/A 08/23/2016   Procedure: LAPAROSCOPIC CHOLECYSTECTOMY;  Surgeon: Clayburn Pert, MD;  Location: [facility] ORS;  Service: General;  Laterality: N/A;   [place] N/A 06/27/2018   Procedure: DILATATION & CURETTAGE/HYSTEROSCOPY WITH MYOSURE;  Surgeon: Malachy Mood, MD;  Location: [facility] ORS;  Service: Gynecology;  Laterality: N/A;   HERNIA REPAIR     LAPAROSCOPIC APPENDECTOMY N/A 02/17/2018   Procedure: APPENDECTOMY LAPAROSCOPIC;  Surgeon: Benjamine Sprague, DO;  Location: [facility] ORS;  Service: General;  Laterality: N/A;   PPM GENERATOR CHANGEOUT N/A 09/14/2020   Procedure: PPM GENERATOR CHANGEOUT;  Surgeon: Isaias Cowman, MD;  Location: [facility]  CV LAB;  Service: Cardiovascular;  Laterality: N/A;   TEE WITHOUT CARDIOVERSION N/A 08/10/2020   Procedure: TRANSESOPHAGEAL ECHOCARDIOGRAM (TEE);  Surgeon: Dixie Dials, MD;  Location: [facility] ENDOSCOPY;  Service: Cardiovascular;  Laterality: N/A;    Prior to Admission medications   Medication Sig Start Date End Date Taking? Authorizing Provider  albuterol (PROVENTIL HFA;VENTOLIN HFA) 108 (90 Base) MCG/ACT inhaler Inhale 2 puffs into the lungs every 6 (six) hours as needed for wheezing or shortness of breath.    [provider]  alum & mag hydroxide-simeth (MAALOX/MYLANTA) 200-200-20 MG/5ML suspension Take 30 mLs by mouth every 4 (four) hours as needed  for indigestion or heartburn. 08/13/20   Dixie Dials, MD  amiodarone (PACERONE) 200 MG tablet Take 1 tablet (200 mg total) by mouth daily. Patient taking differently: Take 100 mg by mouth daily. 08/13/20   Dixie Dials, MD  apixaban (ELIQUIS) 5 MG TABS tablet Take 1 tablet (5 mg total) by mouth 2 (two) times daily. 08/13/20   Dixie Dials, MD  calcitRIOL (ROCALTROL) 0.25 MCG capsule Take 0.25 mcg by mouth daily. 11/12/20   [provider]  carvedilol (COREG) 6.25 MG tablet Take 6.25 mg by mouth 2 (two) times daily. 12/06/20   [provider]  digoxin 62.5 MCG TABS Take 0.0625 mg by mouth daily. 08/14/20   Dixie Dials, MD  fluticasone (FLONASE) 50 MCG/ACT nasal spray Place 2 sprays into both nostrils daily. 07/26/18   Volney American, PA-C  Fluticasone-Umeclidin-Vilant (TRELEGY ELLIPTA) 100-62.5-25 MCG/INH AEPB Inhale 1 puff into the lungs daily. 02/12/20   [provider]  gabapentin (NEURONTIN) 100 MG capsule Take 1 capsule (100 mg total) by mouth at bedtime. 08/13/20   Dixie Dials, MD  levothyroxine (SYNTHROID) 175 MCG tablet Take 1 tablet (175 mcg total) by mouth daily. 12/10/20   Renato Shin, MD  midodrine (PROAMATINE) 5 MG tablet Take 1 tablet (5 mg total) by mouth 3 (three) times daily with meals. 12/06/20   Jon Billings, NP  nystatin cream (MYCOSTATIN) Apply 1 application topically 2 (two) times daily. 09/23/20   Jon Billings, NP  OXYGEN Inhale 3 L into the lungs 3 (three) times daily as needed (shortness of breath or coughing).    [provider]  spironolactone (ALDACTONE) 25 MG tablet Take 12.5 mg by mouth daily. 04/09/17   [provider]  torsemide (DEMADEX) 20 MG tablet Take 20 mg by mouth daily.    [provider]  triamcinolone (KENALOG) 0.025 % ointment Apply 1 application topically 2 (two) times daily. 12/13/20   Jon Billings, NP    Family History  Problem Relation Age of Onset   Diabetes Mellitus II Mother     Hypertension Mother    Heart attack Father    Arthritis Sister    Arthritis Sister    Thyroid disease Brother    Heart disease Brother    Diabetes Brother    Depression Daughter    Depression Son    Schizophrenia Son    Breast cancer Neg Hx      Social History   Tobacco Use   Smoking status: Former    Packs/day: 0.25    Types: Cigarettes   Smokeless tobacco: Never   Tobacco comments:    quit at age 23, whole pack lasted 3 weeks   Vaping Use   Vaping Use: Never used  Substance Use Topics   Alcohol use: No    Alcohol/week: 0.0 standard drinks   Drug use: No    Allergies as  of 12/14/2020 - Review Complete 12/14/2020  Allergen Reaction Noted   Levaquin [levofloxacin] Anaphylaxis 05/03/2014   Amoxicillin Hives 05/03/2014   Nitrofuran derivatives Other (See Comments) 05/03/2014   Penicillins Other (See Comments) 05/03/2014   Zithromax [azithromycin] Other (See Comments) 05/03/2014   Antihistamines, chlorpheniramine-type Rash 04/22/2010    Review of Systems:    All systems reviewed and negative except where noted in HPI.   Physical Exam:  Vital signs in last 24 hours: Temp:  [97.5 F (36.4 C)-98.7 F (37.1 C)] 98 F (36.7 C) (12/13 1450) Pulse Rate:  [69-73] 71 (12/13 1450) Resp:  [14-22] 21 (12/13 1450) BP: (101-108)/(39-52) 101/41 (12/13 1450) SpO2:  [84 %-100 %] 100 % (12/13 1450) Weight:  [68 kg] 68 kg (12/13 1205)   General:   Pleasant, cooperative in NAD Head:  Normocephalic and atraumatic. Eyes:   No icterus.   Conjunctiva pink. PERRLA. Ears:  Normal auditory acuity. Neck:  Supple; no masses or thyroidomegaly Lungs: Respirations even and unlabored. Lungs clear to auscultation bilaterally.   No wheezes, crackles, or rhonchi.  Heart:  Regular rate and rhythm;  Without murmur, clicks, rubs or gallops Abdomen:  Soft, nondistended, nontender. Normal bowel sounds. No appreciable masses or hepatomegaly.  No rebound or guarding.  Rectal:  Not  performed. Msk:  Symmetrical without gross deformities.    Extremities:  Without edema, cyanosis or clubbing. Neurologic:  Alert and oriented x3;  grossly normal neurologically. Skin:  Intact without significant lesions or rashes. Cervical Nodes:  No significant cervical adenopathy. Psych:  Alert and cooperative. Normal affect.  LAB RESULTS: Recent Labs    12/13/20 1354 12/14/20 1208  WBC 5.7 6.1  HGB 7.0* 6.6*  HCT 23.8* 22.3*  PLT 265 256   BMET Recent Labs    12/13/20 1354 12/14/20 1208  NA 136 136  K 4.1 4.0  CL 90* 93*  CO2 33* 34*  GLUCOSE 99 91  BUN 30* 38*  CREATININE 1.67* 1.59*  CALCIUM 9.0 8.6*   LFT Recent Labs    12/14/20 1208  PROT 6.7  ALBUMIN 3.0*  AST 22  ALT 18  ALKPHOS 71  BILITOT 0.9   PT/INR Recent Labs    12/14/20 1208  LABPROT 21.4*  INR 1.9*    STUDIES: No results found.    Impression / Plan:   Assessment: Principal Problem:   Acute blood loss anemia (ABLA)   DANISA KOPEC is a 70 y.o. y/o female with who comes in with a report of dark stools and a stool test while in the ER that showed maroon stools.  The patient has profound anemia and has not had a colonoscopy in the past.  She is on Eliquis but denies any NSAIDs.  Plan:  The patient will be set up for an EGD and colonoscopy for tomorrow.  The patient will be given a prep today.  The patient has been explained the plan and agrees with it.  She will continue to receive blood products as necessary. PPI IV twice daily  Continue serial CBCs and transfuse PRN Avoid NSAIDs Maintain 2 large-bore IV lines Please page GI with any acute hemodynamic changes, or signs of active GI bleeding   Thank you for involving me in the care of this patient.      LOS: 0 days   Lucilla Lame, MD, [facility] 12/14/2020, 3:11 PM,  Pager [phone] 7am-5pm  Check AMION for 5pm -7am coverage and on weekends   Note: This dictation was prepared with Dragon dictation  along with smaller phrase  technology. Any transcriptional errors that result from this process are unintentional.

## 2020-12-14 NOTE — Progress Notes (Signed)
Called and spoke with [name], patient's daughter, recommend patient be seen in the ER for blood transfusion.

## 2020-12-14 NOTE — ED Provider Notes (Signed)
[facility] Emergency Department Provider Note  ____________________________________________  Time seen: Approximately 3:14 PM  I have reviewed the triage vital signs and the nursing notes.   HISTORY  Chief Complaint abnormal labs    HPI Maria Neal is a 70 y.o. female with a history of CAD CHF COPD hypertension who comes ED complaining of fatigue and dark stools for the past few days.  She went to her doctor yesterday and was found to have a hemoglobin level of 7, decreased from a baseline of 9.  She was sent to the ED for further evaluation.  She denies acute chest pain or shortness of breath, no syncope.  No vomiting.  Denies abdominal pain.    Past Medical History:  Diagnosis Date   Acute appendicitis 02/17/2018   AICD (automatic cardioverter/defibrillator) present    Anxiety state    Arrhythmia    atrial fibrillation   CAD (coronary artery disease)    Cardiomyopathy ([place])    CHF (congestive heart failure) (HCC)    COPD (chronic obstructive pulmonary disease) ([place])    Dyspnea    Headache    History of kidney stones    History of shingles    Hypertension    On home oxygen therapy    bedtime and prn   Restless leg syndrome      Patient Active Problem List   Diagnosis Date Noted   Acute blood loss anemia (ABLA) 12/14/2020   Aortic atherosclerosis ([place]) 12/13/2020   Thrombophilia ([place]) 12/13/2020   COPD exacerbation ([place]) 11/04/2020   Chronic respiratory failure with hypoxia (HCC) - 3 L/min 11/04/2020   Influenza A 11/04/2020   Acute on chronic systolic CHF (congestive heart failure) ([place]) 11/04/2020   Acute on chronic respiratory failure with hypoxia ([place]) - home O2 @ 3 L/min 11/04/2020   Chronic a-fib ([place]) 11/04/2020   Hypothyroidism 10/14/2020   Current use of long term anticoagulation 32/20/2542   Acute systolic heart failure ([place]) 08/07/2020   Medication management 06/25/2020   Chronic kidney disease, stage 3a ([place]) 09/04/2019    IFG (impaired fasting glucose) 04/01/2019   Leg pain 07/23/2018   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/23/2018   Chronic venous insufficiency 07/23/2018   Simple endometrial hyperplasia without atypia 07/04/2018   Epigastric pain 06/28/2018   Cervical radiculitis 05/17/2017   Hypercholesteremia 05/02/2017   Obesity (BMI 30-39.9) 05/02/2017   Mitral valve insufficiency 04/16/2017   Closed fracture of lateral malleolus 70/62/3762   Chronic systolic congestive heart failure, NYHA class 3 ([place]) 08/07/2016   NICM (nonischemic cardiomyopathy) ([place]) 08/07/2016   S/P ICD (internal cardiac defibrillator) procedure 12/02/2014   Bundle branch block, 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 Idelle Leech   CARDIAC CATHETERIZATION     CARDIAC DEFIBRILLATOR PLACEMENT     CARDIOVERSION N/A 08/11/2020   Procedure: CARDIOVERSION;  Surgeon: Dixie Dials, MD;  Location: [facility] ENDOSCOPY;  Service: Cardiovascular;  Laterality: N/A;   CHOLECYSTECTOMY N/A 08/23/2016   Procedure: LAPAROSCOPIC CHOLECYSTECTOMY;  Surgeon: Clayburn Pert, MD;  Location: [facility] ORS;  Service: General;  Laterality: N/A;   DILATATION & CURETTAGE/HYSTEROSCOPY WITH MYOSURE N/A 06/27/2018   Procedure: DILATATION & CURETTAGE/HYSTEROSCOPY WITH MYOSURE;  Surgeon: Malachy Mood, MD;  Location: [facility] ORS;  Service: Gynecology;  Laterality: N/A;   HERNIA REPAIR     LAPAROSCOPIC APPENDECTOMY N/A 02/17/2018   Procedure: APPENDECTOMY LAPAROSCOPIC;  Surgeon: Benjamine Sprague, DO;  Location: [facility] ORS;  Service: General;  Laterality: N/A;   PPM GENERATOR CHANGEOUT N/A 09/14/2020   Procedure: PPM GENERATOR CHANGEOUT;  Surgeon: Isaias Cowman, MD;   Location: [facility] CV LAB;  Service: Cardiovascular;  Laterality: N/A;   TEE WITHOUT CARDIOVERSION N/A 08/10/2020   Procedure: TRANSESOPHAGEAL ECHOCARDIOGRAM (TEE);  Surgeon: Dixie Dials, MD;  Location: [facility] ENDOSCOPY;  Service: Cardiovascular;  Laterality: N/A;     Prior to Admission medications   Medication Sig Start Date End Date Taking? Authorizing Provider  albuterol (PROVENTIL HFA;VENTOLIN HFA) 108 (90 Base) MCG/ACT inhaler Inhale 2 puffs into the lungs every 6 (six) hours as needed for wheezing or shortness of breath.    [provider]  alum & mag hydroxide-simeth (MAALOX/MYLANTA) 200-200-20 MG/5ML suspension Take 30 mLs by mouth every 4 (four) hours as needed for indigestion or heartburn. 08/13/20   Dixie Dials, MD  amiodarone (PACERONE) 200 MG tablet Take 1 tablet (200 mg total) by mouth daily. Patient taking differently: Take 100 mg by mouth daily. 08/13/20   Dixie Dials, MD  apixaban (ELIQUIS) 5 MG TABS tablet Take 1 tablet (5 mg total) by mouth 2 (two) times daily. 08/13/20   Dixie Dials, MD  calcitRIOL (ROCALTROL) 0.25 MCG capsule Take 0.25 mcg by mouth daily. 11/12/20   [provider]  carvedilol (COREG) 6.25 MG tablet Take 6.25 mg by mouth 2 (two) times daily. 12/06/20   [provider]  digoxin 62.5 MCG TABS Take 0.0625 mg by mouth daily. 08/14/20   Dixie Dials, MD  fluticasone (FLONASE) 50 MCG/ACT nasal spray Place 2 sprays into both nostrils daily. 07/26/18   Volney American, PA-C  Fluticasone-Umeclidin-Vilant (TRELEGY ELLIPTA) 100-62.5-25 MCG/INH AEPB Inhale 1 puff into the lungs daily. 02/12/20   [provider]  gabapentin (NEURONTIN) 100 MG capsule Take 1 capsule (100 mg total) by mouth at bedtime. 08/13/20   Dixie Dials, MD  levothyroxine (SYNTHROID) 175 MCG tablet Take 1 tablet (175 mcg total) by mouth daily. 12/10/20   Renato Shin, MD  midodrine (PROAMATINE) 5 MG tablet Take 1 tablet (5 mg total) by mouth 3 (three)  times daily with meals. 12/06/20   Jon Billings, NP  nystatin cream (MYCOSTATIN) Apply 1 application topically 2 (two) times daily. 09/23/20   Jon Billings, NP  OXYGEN Inhale 3 L into the lungs 3 (three) times daily as needed (shortness of breath or coughing).    [provider]  spironolactone (ALDACTONE) 25 MG tablet Take 12.5 mg by mouth daily. 04/09/17   [provider]  torsemide (DEMADEX) 20 MG tablet Take 20 mg by mouth daily.    [provider]  triamcinolone (KENALOG) 0.025 % ointment Apply 1 application topically 2 (two) times daily. 12/13/20   Jon Billings, NP     Allergies Levaquin [levofloxacin]; Amoxicillin; Nitrofuran derivatives; Penicillins; Zithromax [azithromycin]; and Antihistamines, chlorpheniramine-type   Family History  Problem Relation Age of Onset   Diabetes Mellitus II Mother    Hypertension Mother    Heart attack Father    Arthritis Sister    Arthritis Sister    Thyroid disease Brother    Heart disease Brother    Diabetes Brother    Depression Daughter    Depression Son    Schizophrenia Son    Breast cancer Neg Hx  Social History Social History   Tobacco Use   Smoking status: Former    Packs/day: 0.25    Types: Cigarettes   Smokeless tobacco: Never   Tobacco comments:    quit at age 40, whole pack lasted 3 weeks   Vaping Use   Vaping Use: Never used  Substance Use Topics   Alcohol use: No    Alcohol/week: 0.0 standard drinks   Drug use: No    Review of Systems  Constitutional:   No fever or chills.  ENT:   No sore throat. No rhinorrhea. Cardiovascular:   No chest pain or syncope. Respiratory:   No dyspnea or cough. Gastrointestinal:   Negative for abdominal pain, vomiting and diarrhea.  Musculoskeletal:   Negative for focal pain or swelling All other systems reviewed and are negative except as documented above in ROS and HPI.  ____________________________________________   PHYSICAL  EXAM:  VITAL SIGNS: ED Triage Vitals  Enc Vitals Group     BP 12/14/20 1153 (!) 104/39     Pulse Rate 12/14/20 1153 73     Resp 12/14/20 1153 16     Temp 12/14/20 1153 (!) 97.5 F (36.4 C)     Temp Source 12/14/20 1153 Oral     SpO2 12/14/20 1153 (!) 84 %     Weight 12/14/20 1205 149 lb 14.6 oz (68 kg)     Height 12/14/20 1205 5\' 4"  (1.626 m)     Head Circumference --      Peak Flow --      Pain Score 12/14/20 1205 0     Pain Loc --      Pain Edu? --      Excl. in [place]? --     Vital signs reviewed, nursing assessments reviewed.   Constitutional:   Alert and oriented. Non-toxic appearance. Eyes:   Conjunctivae are normal. EOMI. PERRL. ENT      Head:   Normocephalic and atraumatic.      Nose:   Wearing a mask.      Mouth/Throat:   Wearing a mask.      Neck:   No meningismus. Full ROM. Hematological/Lymphatic/Immunilogical:   No cervical lymphadenopathy. Cardiovascular:   RRR. Symmetric bilateral radial and DP pulses.  No murmurs. Cap refill less than 2 seconds. Respiratory:   Normal respiratory effort without tachypnea/retractions. Breath sounds are clear and equal bilaterally. No wheezes/rales/rhonchi. Gastrointestinal:   Soft and nontender. Non distended. There is no CVA tenderness.  No rebound, rigidity, or guarding.  Rectal exam shows maroon stool, strongly guaiac positive.  No hemorrhoids  Musculoskeletal:   Normal range of motion in all extremities. No joint effusions.  No lower extremity tenderness.  No edema. Neurologic:   Normal speech and language.  Motor grossly intact. No acute focal neurologic deficits are appreciated.  Skin:    Skin is warm, dry and intact. No rash noted.  No petechiae, purpura, or bullae.  ____________________________________________    LABS (pertinent positives/negatives) (all labs ordered are listed, but only abnormal results are displayed) Labs Reviewed  COMPREHENSIVE METABOLIC PANEL - Abnormal; Notable for the following components:       Result Value   Chloride 93 (*)    CO2 34 (*)    BUN 38 (*)    Creatinine, Ser 1.59 (*)    Calcium 8.6 (*)    Albumin 3.0 (*)    GFR, Estimated 35 (*)    All other components within normal limits  CBC - Abnormal; Notable for  the following components:   RBC 2.55 (*)    Hemoglobin 6.6 (*)    HCT 22.3 (*)    MCH 25.9 (*)    MCHC 29.6 (*)    RDW 17.2 (*)    All other components within normal limits  PROTIME-INR - Abnormal; Notable for the following components:   Prothrombin Time 21.4 (*)    INR 1.9 (*)    All other components within normal limits  POC OCCULT BLOOD, ED  TYPE AND SCREEN  PREPARE RBC (CROSSMATCH)   ____________________________________________   EKG    ____________________________________________    RADIOLOGY  No results found.  ____________________________________________   PROCEDURES .Critical Care Performed by: Carrie Mew, MD Authorized by: Carrie Mew, MD   Critical care provider statement:    Critical care time (minutes):  33   Critical care time was exclusive of:  Separately billable procedures and treating other patients   Critical care was necessary to treat or prevent imminent or life-threatening deterioration of the following conditions:  Circulatory failure   Critical care was time spent personally by me on the following activities:  Development of treatment plan with patient or surrogate, discussions with consultants, evaluation of patient's response to treatment, examination of patient, obtaining history from patient or surrogate, ordering and performing treatments and interventions, ordering and review of laboratory studies, ordering and review of radiographic studies, pulse oximetry, re-evaluation of patient's condition and review of old charts   Care discussed with: admitting provider    ____________________________________________    CLINICAL IMPRESSION / [place] / ED COURSE  Medications ordered in the  ED: Medications  0.9 %  sodium chloride infusion (has no administration in time range)  pantoprazole (PROTONIX) injection 40 mg (has no administration in time range)    Pertinent labs & imaging results that were available during my care of the patient were reviewed by me and considered in my medical decision making (see chart for details).  Maria Neal was evaluated in Emergency Department on 12/14/2020 for the symptoms described in the history of present illness. She was evaluated in the context of the global COVID-19 pandemic, which necessitated consideration that the patient might be at risk for infection with the SARS-CoV-2 virus that causes COVID-19. Institutional protocols and algorithms that pertain to the evaluation of patients at risk for COVID-19 are in a state of rapid change based on information released by regulatory bodies including the CDC and federal and state organizations. These policies and algorithms were followed during the patient's care in the ED.   Patient presents with dark stool, exam consistent with GI bleed.  She is not in hemorrhagic shock but blood pressure is borderline.  She is on Eliquis for chronic A. fib.  Will order 1 unit red blood cell transfusion.  Case discussed with GI who plans to do upper endoscopy and colonoscopy tomorrow.  Case discussed with hospitalist for further management.      ____________________________________________   FINAL CLINICAL IMPRESSION(S) / ED DIAGNOSES    Final diagnoses:  Acute GI bleeding  Acute blood loss anemia     ED Discharge Orders     None       Portions of this note were generated with dragon dictation software. Dictation errors may occur despite best attempts at proofreading.    Carrie Mew, MD 12/14/20 [phone]

## 2020-12-15 ENCOUNTER — Inpatient Hospital Stay: Payer: Medicare Other

## 2020-12-15 ENCOUNTER — Observation Stay: Payer: Medicare Other | Admitting: Anesthesiology

## 2020-12-15 ENCOUNTER — Encounter
Admission: EM | Disposition: A | Discharge status: Home / Self Care | Payer: Self-pay | Source: Home / Self Care | Attending: Internal Medicine

## 2020-12-15 DIAGNOSIS — I130 Hypertensive heart and chronic kidney disease with heart failure and stage 1 through stage 4 chronic kidney disease, or unspecified chronic kidney disease: Secondary | ICD-10-CM | POA: Diagnosis present

## 2020-12-15 DIAGNOSIS — D62 Acute posthemorrhagic anemia: Secondary | ICD-10-CM

## 2020-12-15 DIAGNOSIS — Z95810 Presence of automatic (implantable) cardiac defibrillator: Secondary | ICD-10-CM | POA: Diagnosis not present

## 2020-12-15 DIAGNOSIS — K922 Gastrointestinal hemorrhage, unspecified: Secondary | ICD-10-CM | POA: Diagnosis not present

## 2020-12-15 DIAGNOSIS — I428 Other cardiomyopathies: Secondary | ICD-10-CM | POA: Diagnosis present

## 2020-12-15 DIAGNOSIS — I700 Atherosclerosis of aorta: Secondary | ICD-10-CM | POA: Diagnosis not present

## 2020-12-15 DIAGNOSIS — J9611 Chronic respiratory failure with hypoxia: Secondary | ICD-10-CM | POA: Diagnosis present

## 2020-12-15 DIAGNOSIS — Z881 Allergy status to other antibiotic agents status: Secondary | ICD-10-CM | POA: Diagnosis not present

## 2020-12-15 DIAGNOSIS — K5730 Diverticulosis of large intestine without perforation or abscess without bleeding: Secondary | ICD-10-CM | POA: Diagnosis present

## 2020-12-15 DIAGNOSIS — K5710 Diverticulosis of small intestine without perforation or abscess without bleeding: Secondary | ICD-10-CM | POA: Diagnosis not present

## 2020-12-15 DIAGNOSIS — Z9981 Dependence on supplemental oxygen: Secondary | ICD-10-CM | POA: Diagnosis not present

## 2020-12-15 DIAGNOSIS — I482 Chronic atrial fibrillation, unspecified: Secondary | ICD-10-CM | POA: Diagnosis not present

## 2020-12-15 DIAGNOSIS — I5022 Chronic systolic (congestive) heart failure: Secondary | ICD-10-CM | POA: Diagnosis present

## 2020-12-15 DIAGNOSIS — K449 Diaphragmatic hernia without obstruction or gangrene: Secondary | ICD-10-CM | POA: Diagnosis not present

## 2020-12-15 DIAGNOSIS — D122 Benign neoplasm of ascending colon: Secondary | ICD-10-CM | POA: Diagnosis not present

## 2020-12-15 DIAGNOSIS — G2581 Restless legs syndrome: Secondary | ICD-10-CM | POA: Diagnosis present

## 2020-12-15 DIAGNOSIS — K635 Polyp of colon: Secondary | ICD-10-CM | POA: Diagnosis not present

## 2020-12-15 DIAGNOSIS — N2889 Other specified disorders of kidney and ureter: Secondary | ICD-10-CM | POA: Diagnosis not present

## 2020-12-15 DIAGNOSIS — R748 Abnormal levels of other serum enzymes: Secondary | ICD-10-CM | POA: Diagnosis present

## 2020-12-15 DIAGNOSIS — I2510 Atherosclerotic heart disease of native coronary artery without angina pectoris: Secondary | ICD-10-CM | POA: Diagnosis present

## 2020-12-15 DIAGNOSIS — I959 Hypotension, unspecified: Secondary | ICD-10-CM | POA: Diagnosis present

## 2020-12-15 DIAGNOSIS — J449 Chronic obstructive pulmonary disease, unspecified: Secondary | ICD-10-CM | POA: Diagnosis present

## 2020-12-15 DIAGNOSIS — E039 Hypothyroidism, unspecified: Secondary | ICD-10-CM | POA: Diagnosis present

## 2020-12-15 DIAGNOSIS — D124 Benign neoplasm of descending colon: Secondary | ICD-10-CM | POA: Diagnosis not present

## 2020-12-15 DIAGNOSIS — Z20822 Contact with and (suspected) exposure to covid-19: Secondary | ICD-10-CM | POA: Diagnosis present

## 2020-12-15 DIAGNOSIS — K4090 Unilateral inguinal hernia, without obstruction or gangrene, not specified as recurrent: Secondary | ICD-10-CM | POA: Diagnosis not present

## 2020-12-15 DIAGNOSIS — D120 Benign neoplasm of cecum: Secondary | ICD-10-CM | POA: Diagnosis not present

## 2020-12-15 DIAGNOSIS — F411 Generalized anxiety disorder: Secondary | ICD-10-CM | POA: Diagnosis present

## 2020-12-15 DIAGNOSIS — N1831 Chronic kidney disease, stage 3a: Secondary | ICD-10-CM | POA: Diagnosis present

## 2020-12-15 DIAGNOSIS — K5731 Diverticulosis of large intestine without perforation or abscess with bleeding: Secondary | ICD-10-CM | POA: Diagnosis present

## 2020-12-15 DIAGNOSIS — Z87442 Personal history of urinary calculi: Secondary | ICD-10-CM | POA: Diagnosis not present

## 2020-12-15 HISTORY — PX: ESOPHAGOGASTRODUODENOSCOPY: SHX5428

## 2020-12-15 HISTORY — PX: COLONOSCOPY WITH PROPOFOL: SHX5780

## 2020-12-15 LAB — BASIC METABOLIC PANEL
Anion gap: 5 (ref 5–15)
BUN: 31 mg/dL — ABNORMAL HIGH (ref 8–23)
CO2: 36 mmol/L — ABNORMAL HIGH (ref 22–32)
Calcium: 8.4 mg/dL — ABNORMAL LOW (ref 8.9–10.3)
Chloride: 93 mmol/L — ABNORMAL LOW (ref 98–111)
Creatinine, Ser: 1.54 mg/dL — ABNORMAL HIGH (ref 0.44–1.00)
GFR, Estimated: 36 mL/min — ABNORMAL LOW (ref >60)
Glucose, Bld: 85 mg/dL (ref 70–99)
Potassium: 4.3 mmol/L (ref 3.5–5.1)
Sodium: 134 mmol/L — ABNORMAL LOW (ref 135–145)

## 2020-12-15 LAB — RESP PANEL BY RT-PCR (FLU A&B, COVID) ARPGX2
Influenza A by PCR: NEGATIVE
Influenza B by PCR: NEGATIVE
SARS Coronavirus 2 by RT PCR: NEGATIVE

## 2020-12-15 LAB — FERRITIN: Ferritin: 19 ng/mL (ref 11–307)

## 2020-12-15 LAB — IRON AND TIBC
Iron: 17 ug/dL — ABNORMAL LOW (ref 28–170)
Saturation Ratios: 5 % — ABNORMAL LOW (ref 10.4–31.8)
TIBC: 328 ug/dL (ref 250–450)
UIBC: 311 ug/dL

## 2020-12-15 LAB — FOLATE: Folate: 10.9 ng/mL (ref >5.9)

## 2020-12-15 LAB — VITAMIN B12: Vitamin B-12: 210 pg/mL (ref 180–914)

## 2020-12-15 LAB — HEMOGLOBIN AND HEMATOCRIT, BLOOD
HCT: 24.3 % — ABNORMAL LOW (ref 36.0–46.0)
HCT: 26 % — ABNORMAL LOW (ref 36.0–46.0)
Hemoglobin: 7.3 g/dL — ABNORMAL LOW (ref 12.0–15.0)
Hemoglobin: 7.9 g/dL — ABNORMAL LOW (ref 12.0–15.0)

## 2020-12-15 SURGERY — COLONOSCOPY WITH PROPOFOL
Anesthesia: General

## 2020-12-15 MED ORDER — PROPOFOL 500 MG/50ML IV EMUL
INTRAVENOUS | Status: AC
  Filled 2020-12-15: qty 50

## 2020-12-15 MED ORDER — MIDAZOLAM HCL 2 MG/2ML IJ SOLN
INTRAMUSCULAR | Status: AC
  Filled 2020-12-15: qty 2

## 2020-12-15 MED ORDER — MIDAZOLAM HCL 2 MG/2ML IJ SOLN
INTRAMUSCULAR | Status: DC | PRN
  Administered 2020-12-15: 1 mg via INTRAVENOUS

## 2020-12-15 MED ORDER — FENTANYL CITRATE (PF) 100 MCG/2ML IJ SOLN
INTRAMUSCULAR | Status: DC | PRN
  Administered 2020-12-15: 50 ug via INTRAVENOUS

## 2020-12-15 MED ORDER — SODIUM CHLORIDE 0.9 % IV SOLN
300.0000 mg | Freq: Once | INTRAVENOUS | Status: AC
  Administered 2020-12-15: 17:00:00 300 mg via INTRAVENOUS
  Filled 2020-12-15: qty 300

## 2020-12-15 MED ORDER — SODIUM CHLORIDE 0.9 % IV SOLN
INTRAVENOUS | Status: DC
  Administered 2020-12-15: 17:00:00 500 mL via INTRAVENOUS

## 2020-12-15 MED ORDER — ENSURE ENLIVE PO LIQD
237.0000 mL | Freq: Two times a day (BID) | ORAL | Status: DC
  Administered 2020-12-18: 237 mL via ORAL

## 2020-12-15 MED ORDER — PROPOFOL 10 MG/ML IV BOLUS
INTRAVENOUS | Status: DC | PRN
  Administered 2020-12-15: 20 mg via INTRAVENOUS

## 2020-12-15 MED ORDER — POLYETHYLENE GLYCOL 3350 17 GM/SCOOP PO POWD
1.0000 | Freq: Once | ORAL | Status: DC
  Filled 2020-12-15: qty 255

## 2020-12-15 MED ORDER — VITAMIN B-12 1000 MCG PO TABS
1000.0000 ug | ORAL_TABLET | Freq: Every day | ORAL | Status: DC
  Administered 2020-12-16 – 2020-12-18 (×3): 1000 ug via ORAL
  Filled 2020-12-15 (×3): qty 1

## 2020-12-15 MED ORDER — SODIUM CHLORIDE 0.9 % IV SOLN
150.0000 mg | Freq: Every day | INTRAVENOUS | Status: DC
  Administered 2020-12-16: 150 mg via INTRAVENOUS
  Filled 2020-12-15 (×2): qty 7.5

## 2020-12-15 MED ORDER — PROPOFOL 500 MG/50ML IV EMUL
INTRAVENOUS | Status: DC | PRN
  Administered 2020-12-15: 50 ug/kg/min via INTRAVENOUS

## 2020-12-15 MED ORDER — POLYSACCHARIDE IRON COMPLEX 150 MG PO CAPS
150.0000 mg | ORAL_CAPSULE | Freq: Every day | ORAL | Status: DC
  Administered 2020-12-15 – 2020-12-18 (×3): 150 mg via ORAL
  Filled 2020-12-15 (×4): qty 1

## 2020-12-15 MED ORDER — FENTANYL CITRATE (PF) 100 MCG/2ML IJ SOLN
INTRAMUSCULAR | Status: AC
  Filled 2020-12-15: qty 2

## 2020-12-15 NOTE — H&P (Signed)
Cephas Darby, MD [address]  [place]  [place], [place] [address]  Main: [phone]  Fax: [phone] Pager: [phone]  Primary Care Physician:  Jon Billings, NP Primary Gastroenterologist:  Dr. Cephas Darby  Pre-Procedure History & Physical: HPI:  Maria Neal is a 70 y.o. female is here for an endoscopy and colonoscopy.   Past Medical History:  Diagnosis Date   Acute appendicitis 02/17/2018   AICD (automatic cardioverter/defibrillator) present    Anxiety state    Arrhythmia    atrial fibrillation   CAD (coronary artery disease)    Cardiomyopathy ([place])    CHF (congestive heart failure) (HCC)    COPD (chronic obstructive pulmonary disease) ([place])    Dyspnea    Headache    History of kidney stones    History of shingles    Hypertension    On home oxygen therapy    bedtime and prn   Restless leg syndrome     Past Surgical History:  Procedure Laterality Date   CARDIAC CATHETERIZATION  04/2014   Dr. Idelle Leech   CARDIAC CATHETERIZATION     CARDIAC DEFIBRILLATOR PLACEMENT     CARDIOVERSION N/A 08/11/2020   Procedure: CARDIOVERSION;  Surgeon: Dixie Dials, MD;  Location: [facility] ENDOSCOPY;  Service: Cardiovascular;  Laterality: N/A;   CHOLECYSTECTOMY N/A 08/23/2016   Procedure: LAPAROSCOPIC CHOLECYSTECTOMY;  Surgeon: Clayburn Pert, MD;  Location: [facility] ORS;  Service: General;  Laterality: N/A;   [place] N/A 06/27/2018   Procedure: DILATATION & CURETTAGE/HYSTEROSCOPY WITH MYOSURE;  Surgeon: Malachy Mood, MD;  Location: [facility] ORS;  Service: Gynecology;  Laterality: N/A;   HERNIA REPAIR     LAPAROSCOPIC APPENDECTOMY N/A 02/17/2018   Procedure: APPENDECTOMY LAPAROSCOPIC;  Surgeon: Benjamine Sprague, DO;  Location: [facility] ORS;  Service: General;  Laterality: N/A;   PPM GENERATOR CHANGEOUT N/A 09/14/2020   Procedure: PPM GENERATOR CHANGEOUT;  Surgeon: Isaias Cowman, MD;  Location: [place] CV LAB;  Service:  Cardiovascular;  Laterality: N/A;   TEE WITHOUT CARDIOVERSION N/A 08/10/2020   Procedure: TRANSESOPHAGEAL ECHOCARDIOGRAM (TEE);  Surgeon: Dixie Dials, MD;  Location: [facility] ENDOSCOPY;  Service: Cardiovascular;  Laterality: N/A;    Prior to Admission medications   Medication Sig Start Date End Date Taking? Authorizing Provider  albuterol (PROVENTIL HFA;VENTOLIN HFA) 108 (90 Base) MCG/ACT inhaler Inhale 2 puffs into the lungs every 6 (six) hours as needed for wheezing or shortness of breath.   Yes [provider]  alum & mag hydroxide-simeth (MAALOX/MYLANTA) 200-200-20 MG/5ML suspension Take 30 mLs by mouth every 4 (four) hours as needed for indigestion or heartburn. 08/13/20  Yes Dixie Dials, MD  amiodarone (PACERONE) 200 MG tablet Take 1 tablet (200 mg total) by mouth daily. Patient taking differently: Take 100 mg by mouth daily. 08/13/20  Yes Dixie Dials, MD  apixaban (ELIQUIS) 5 MG TABS tablet Take 1 tablet (5 mg total) by mouth 2 (two) times daily. 08/13/20  Yes Dixie Dials, MD  calcitRIOL (ROCALTROL) 0.25 MCG capsule Take 0.25 mcg by mouth daily. 11/12/20  Yes [provider]  carvedilol (COREG) 6.25 MG tablet Take 6.25 mg by mouth 2 (two) times daily. 12/06/20  Yes [provider]  digoxin 62.5 MCG TABS Take 0.0625 mg by mouth daily. 08/14/20  Yes Dixie Dials, MD  fluticasone (FLONASE) 50 MCG/ACT nasal spray Place 2 sprays into both nostrils daily. 07/26/18  Yes Volney American, PA-C  Fluticasone-Umeclidin-Vilant (TRELEGY ELLIPTA) 100-62.5-25 MCG/INH AEPB Inhale 1 puff into the lungs daily. 02/12/20  Yes [provider]  gabapentin (NEURONTIN) 100 MG capsule Take 1 capsule (100 mg total) by mouth at bedtime. 08/13/20  Yes Dixie Dials, MD  levothyroxine (SYNTHROID) 175 MCG tablet Take 1 tablet (175 mcg total) by mouth daily. 12/10/20  Yes Renato Shin, MD  midodrine (PROAMATINE) 5 MG tablet Take 1 tablet (5 mg total) by mouth 3 (three) times daily  with meals. 12/06/20  Yes Jon Billings, NP  nystatin cream (MYCOSTATIN) Apply 1 application topically 2 (two) times daily. 09/23/20  Yes Jon Billings, NP  spironolactone (ALDACTONE) 25 MG tablet Take 12.5 mg by mouth daily. 04/09/17  Yes [provider]  torsemide (DEMADEX) 20 MG tablet Take 20 mg by mouth daily.   Yes [provider]  triamcinolone (KENALOG) 0.025 % ointment Apply 1 application topically 2 (two) times daily. 12/13/20  Yes Jon Billings, NP  OXYGEN Inhale 3 L into the lungs 3 (three) times daily as needed (shortness of breath or coughing).    [provider]    Allergies as of 12/14/2020 - Review Complete 12/14/2020  Allergen Reaction Noted   Levaquin [levofloxacin] Anaphylaxis 05/03/2014   Amoxicillin Hives 05/03/2014   Nitrofuran derivatives Other (See Comments) 05/03/2014   Penicillins Other (See Comments) 05/03/2014   Zithromax [azithromycin] Other (See Comments) 05/03/2014   Antihistamines, chlorpheniramine-type Rash 04/22/2010    Family History  Problem Relation Age of Onset   Diabetes Mellitus II Mother    Hypertension Mother    Heart attack Father    Arthritis Sister    Arthritis Sister    Thyroid disease Brother    Heart disease Brother    Diabetes Brother    Depression Daughter    Depression Son    Schizophrenia Son    Breast cancer Neg Hx     Social History   Socioeconomic History   Marital status: Divorced    Spouse name: Not on file   Number of children: Not on file   Years of education: Not on file   Highest education level: High school graduate  Occupational History   Occupation: retired  Tobacco Use   Smoking status: Former    Packs/day: 0.25    Types: Cigarettes   Smokeless tobacco: Never   Tobacco comments:    quit at age 59, whole pack lasted 3 weeks   Vaping Use   Vaping Use: Never used  Substance and Sexual Activity   Alcohol use: No    Alcohol/week: 0.0 standard drinks   Drug use: No    Sexual activity: Not Currently    Birth control/protection: None  Other Topics Concern   Not on file  Social History Narrative   Not on file   Social Determinants of Health   Financial Resource Strain: Not on file  Food Insecurity: Not on file  Transportation Needs: Not on file  Physical Activity: Not on file  Stress: Not on file  Social Connections: Not on file  Intimate Partner Violence: Not on file    Review of Systems: See HPI, otherwise negative ROS  Physical Exam: BP (!) 115/47    Pulse 72    Temp (!) 97.1 F (36.2 C) (Temporal)    Resp 20    Ht 5\' 4"  (1.626 m)    Wt 68 kg    SpO2 95%    BMI 25.73 kg/m  General:   Alert,  pleasant and cooperative in NAD Head:  Normocephalic and atraumatic. Neck:  Supple; no masses or thyromegaly. Lungs:  Clear throughout  to auscultation.    Heart:  Regular rate and rhythm. Abdomen:  Soft, nontender and nondistended. Normal bowel sounds, without guarding, and without rebound.   Neurologic:  Alert and  oriented x4;  grossly normal neurologically.  Impression/Plan: Maria Neal is here for an endoscopy and colonoscopy to be performed for melena, iron deficiency anemia  Risks, benefits, limitations, and alternatives regarding  endoscopy and colonoscopy have been reviewed with the patient.  Questions have been answered.  All parties agreeable.   Sherri Sear, MD  12/15/2020, 11:04 AM

## 2020-12-15 NOTE — Op Note (Signed)
[facility] Patient Name: Maria Neal Procedure Date: 12/15/2020 11:01 AM MRN: [id number] Account #: [id number] Date of Birth: 1950/01/08 Admit Type: Inpatient Age: 70 Room: [facility] ENDO ROOM 4 Gender: Female Note Status: Finalized Instrument Name: Michaelle Birks [id number] Procedure:             Upper GI endoscopy Indications:           Unexplained iron deficiency anemia, Melena Providers:             Lin Landsman MD, MD Referring MD:          Jon Billings (Referring MD) Medicines:             General Anesthesia Complications:         No immediate complications. Estimated blood loss: None. Procedure:             Pre-Anesthesia Assessment:                        - Prior to the procedure, a History and Physical was                         performed, and patient medications and allergies were                         reviewed. The patient is competent. The risks and                         benefits of the procedure and the sedation options and                         risks were discussed with the patient. All questions                         were answered and informed consent was obtained.                         Patient identification and proposed procedure were                         verified by the physician, the nurse, the                         anesthesiologist, the anesthetist and the technician                         in the pre-procedure area in the procedure room in the                         endoscopy suite. Mental Status Examination: alert and                         oriented. Airway Examination: normal oropharyngeal                         airway and neck mobility. Respiratory Examination:                         clear to auscultation. CV Examination: normal.  Prophylactic Antibiotics: The patient does not require                         prophylactic antibiotics. Prior Anticoagulants: The                          patient has taken Eliquis (apixaban), last dose was 2                         days prior to procedure. ASA Grade Assessment: III - A                         patient with severe systemic disease. After reviewing                         the risks and benefits, the patient was deemed in                         satisfactory condition to undergo the procedure. The                         anesthesia plan was to use general anesthesia.                         Immediately prior to administration of medications,                         the patient was re-assessed for adequacy to receive                         sedatives. The heart rate, respiratory rate, oxygen                         saturations, blood pressure, adequacy of pulmonary                         ventilation, and response to care were monitored                         throughout the procedure. The physical status of the                         patient was re-assessed after the procedure.                        After obtaining informed consent, the endoscope was                         passed under direct vision. Throughout the procedure,                         the patient's blood pressure, pulse, and oxygen                         saturations were monitored continuously. The Endoscope                         was introduced through the mouth, and advanced to  the                         duodenal bulb. The upper GI endoscopy was accomplished                         without difficulty. The patient tolerated the                         procedure well. 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      The entire examined stomach was normal.      A small hiatal hernia was present.      The cardia and gastric fundus were  normal on retroflexion.      The gastroesophageal junction and examined esophagus were normal. Impression:            - Duodenal deformity.                        - Normal stomach.                        - Small hiatal hernia.                        - Normal gastroesophageal junction and esophagus.                        - No specimens collected. Recommendation:        - Proceed with colonoscopy as scheduled and source of                         melena not identified on EGD                        See colonoscopy report Procedure Code(s):     --- Professional ---                        [phone], Esophagogastroduodenoscopy, flexible,                         transoral; diagnostic, including collection of                         specimen(s) by brushing or washing, when performed                         (separate procedure) Diagnosis Code(s):     --- Professional ---                        K31.89, Other diseases of stomach and duodenum                        K44.9, Diaphragmatic hernia without obstruction or  gangrene                        D50.9, Iron deficiency anemia, unspecified                        K92.1, Melena (includes Hematochezia) CPT copyright 2019 [employer]. All rights reserved. The codes documented in this report are preliminary and upon coder review may  be revised to meet current compliance requirements. Dr. Ulyess Mort Lin Landsman MD, MD 12/15/2020 12:15:01 PM This report has been signed electronically. Number of Addenda: 0 Note Initiated On: 12/15/2020 11:01 AM Estimated Blood Loss:  Estimated blood loss: none.      [facility]

## 2020-12-15 NOTE — Anesthesia Procedure Notes (Signed)
Date/Time: 12/15/2020 12:08 PM Performed by: Arita Miss, MD Pre-anesthesia Checklist: Patient identified, Emergency Drugs available, Suction available, Patient being monitored and Timeout performed Patient Re-evaluated:Patient Re-evaluated prior to induction Oxygen Delivery Method: Simple face mask Induction Type: IV induction

## 2020-12-15 NOTE — Op Note (Signed)
[facility] Patient Name: Maria Neal Procedure Date: 12/15/2020 11:00 AM MRN: [id number] Account #: [id number] Date of Birth: [birth year] Admit Type: Inpatient Age: 70 Room: [facility] ENDO ROOM 4 Gender: Female Note Status: Finalized Instrument Name: Peds Colonoscope [id number] Procedure:             Colonoscopy Indications:           This is the patient's first colonoscopy, Melena,                         Unexplained iron deficiency anemia Providers:             Lin Landsman MD, MD Referring MD:          Jon Billings (Referring MD) Medicines:             General Anesthesia Complications:         No immediate complications. Estimated blood loss: None. Procedure:             Pre-Anesthesia Assessment:                        - Prior to the procedure, a History and Physical was                         performed, and patient medications and allergies were                         reviewed. The patient is competent. The risks and                         benefits of the procedure and the sedation options and                         risks were discussed with the patient. All questions                         were answered and informed consent was obtained.                         Patient identification and proposed procedure were                         verified by the physician, the nurse, the                         anesthesiologist, the anesthetist and the technician                         in the pre-procedure area in the procedure room in the                         endoscopy suite. Mental Status Examination: alert and                         oriented. Airway Examination: normal oropharyngeal                         airway and neck mobility. Respiratory Examination:  clear to auscultation. CV Examination: normal.                         Prophylactic Antibiotics: The patient does not require                          prophylactic antibiotics. Prior Anticoagulants: The                         patient has taken Eliquis (apixaban), last dose was 4                         days prior to procedure. ASA Grade Assessment: IV - A                         patient with severe systemic disease that is a                         constant threat to life. After reviewing the risks and                         benefits, the patient was deemed in satisfactory                         condition to undergo the procedure. The anesthesia                         plan was to use general anesthesia. Immediately prior                         to administration of medications, the patient was                         re-assessed for adequacy to receive sedatives. The                         heart rate, respiratory rate, oxygen saturations,                         blood pressure, adequacy of pulmonary ventilation, and                         response to care were monitored throughout the                         procedure. The physical status of the patient was                         re-assessed after the procedure.                        After obtaining informed consent, the colonoscope was                         passed under direct vision. Throughout the procedure,                         the patient's blood pressure, pulse, and oxygen  saturations were monitored continuously. The procedure                         was aborted. The colonscope was not inserted.                         Medications were given. The colonoscopy was extremely                         difficult due to unsatisfactory bowel prep. Successful                         completion of the procedure was aided by scope                         withdrawn, procedure aborted. The patient tolerated                         the procedure well. The quality of the bowel                         preparation was poor. Findings:      The perianal and digital  rectal examinations were normal.      Extensive amounts of solid burgundy stool was found in the recto-sigmoid       colon, precluding visualization.      Procedure aborted Impression:            - Preparation of the colon was poor.                        - Stool in the recto-sigmoid colon.                        - No specimens collected. Recommendation:        - Return patient to hospital ward for ongoing care.                        - Clear liquid diet today.                        - Continue present medications.                        - Repeat colonoscopy tomorrow because the bowel                         preparation was poor. Diagnosis Code(s):     --- Professional ---                        K92.1, Melena (includes Hematochezia)                        D50.9, Iron deficiency anemia, unspecified Dr. Ulyess Mort Lin Landsman MD, MD 12/15/2020 12:22:10 PM This report has been signed electronically. Number of Addenda: 0 Note Initiated On: 12/15/2020 11:00 AM Total Procedure Duration: 0 hours 1 minute 3 seconds  Estimated Blood Loss:  Estimated blood loss: none.      [facility]

## 2020-12-15 NOTE — Progress Notes (Signed)
Pt left unit to go to CT. Post procedure report called in to staff nurse. Pt denies pain or discomfort. Will transfer to unit when finish with CT.

## 2020-12-15 NOTE — Anesthesia Postprocedure Evaluation (Signed)
Anesthesia Post Note  Patient: Maria Neal  Procedure(s) Performed: COLONOSCOPY WITH PROPOFOL ESOPHAGOGASTRODUODENOSCOPY (EGD)  Patient location during evaluation: Endoscopy Anesthesia Type: General Level of consciousness: awake and alert Pain management: pain level controlled Vital Signs Assessment: post-procedure vital signs reviewed and stable Respiratory status: spontaneous breathing, nonlabored ventilation, respiratory function stable and patient connected to nasal cannula oxygen Cardiovascular status: blood pressure returned to baseline and stable Postop Assessment: no apparent nausea or vomiting Anesthetic complications: no   No notable events documented.   Last Vitals:  Vitals:   12/15/20 1226 12/15/20 1234  BP: (!) 105/54 117/66  Pulse: 70   Resp: 14   Temp:    SpO2: 100%     Last Pain:  Vitals:   12/15/20 1234  TempSrc:   PainSc: 0-No pain                 Arita Miss

## 2020-12-15 NOTE — Transfer of Care (Signed)
Immediate Anesthesia Transfer of Care Note  Patient: Maria Neal  Procedure(s) Performed: COLONOSCOPY WITH PROPOFOL ESOPHAGOGASTRODUODENOSCOPY (EGD)  Patient Location: PACU  Anesthesia Type:General  Level of Consciousness: drowsy  Airway & Oxygen Therapy: Patient Spontanous Breathing and Patient connected to nasal cannula oxygen  Post-op Assessment: Report given to RN and Post -op Vital signs reviewed and stable  Post vital signs: Reviewed and stable  Last Vitals:  Vitals Value Taken Time  BP 105/54 12/15/20 1226  Temp    Pulse 70 12/15/20 1226  Resp 14 12/15/20 1226  SpO2 100 % 12/15/20 1226    Last Pain:  Vitals:   12/15/20 1053  TempSrc: Temporal  PainSc:          Complications: No notable events documented.

## 2020-12-15 NOTE — Progress Notes (Signed)
[name] [place] NAME: Maria Neal    MR#:  [id number]  DATE OF BIRTH:  03/15/1950  SUBJECTIVE:   patient seen and endoscopy suite. She underwent EGD. No vomiting or abdominal pain. No family at bedside REVIEW OF SYSTEMS:   Review of Systems  Constitutional:  Negative for chills, fever and weight loss.  HENT:  Negative for ear discharge, ear pain and nosebleeds.   Eyes:  Negative for blurred vision, pain and discharge.  Respiratory:  Negative for sputum production, shortness of breath, wheezing and stridor.   Cardiovascular:  Negative for chest pain, palpitations, orthopnea and PND.  Gastrointestinal:  Positive for blood in stool. Negative for abdominal pain, diarrhea, nausea and vomiting.  Genitourinary:  Negative for frequency and urgency.  Musculoskeletal:  Negative for back pain and joint pain.  Neurological:  Negative for sensory change, speech change, focal weakness and weakness.  Psychiatric/Behavioral:  Negative for depression and hallucinations. The patient is not nervous/anxious.   Tolerating Diet: Tolerating PT:   DRUG ALLERGIES:   Allergies  Allergen Reactions   Levaquin [Levofloxacin] Anaphylaxis   Amoxicillin Hives    Did it involve swelling of the face/tongue/throat, SOB, or low BP? No Did it involve sudden or severe rash/hives, skin peeling, or any reaction on the inside of your mouth or nose? No Did you need to seek medical attention at a hospital or doctor's office? No When did it last happen?      10+ years If all above answers are "NO", may proceed with cephalosporin use.   Nitrofuran Derivatives Other (See Comments)    Unknown   Penicillins Other (See Comments)    Thinks it made her itch a lot, but isn't sure. Did it involve swelling of the face/tongue/throat, SOB, or low BP? No Did it involve sudden or severe rash/hives, skin peeling, or any reaction on the inside of your mouth or nose? No Did you need to  seek medical attention at a hospital or doctor's office? No When did it last happen?      10+ years If all above answers are "NO", may proceed with cephalosporin use.    Zithromax [Azithromycin] Other (See Comments)   Antihistamines, Chlorpheniramine-Type Rash    VITALS:  Blood pressure (!) 103/43, pulse 74, temperature 98.1 F (36.7 C), resp. rate 20, height 5\' 4"  (1.626 m), weight 68 kg, SpO2 100 %.  PHYSICAL EXAMINATION:   Physical Exam  GENERAL:  70 y.o.-year-old patient lying in the bed with no acute distress.  HEENT: Head atraumatic, normocephalic. Oropharynx and nasopharynx clear.  LUNGS: Normal breath sounds bilaterally, no wheezing, rales, rhonchi. No use of accessory muscles of respiration.  CARDIOVASCULAR: S1, S2 normal. No murmurs, rubs, or gallops.  ABDOMEN: Soft, nontender, nondistended. Bowel sounds present. No organomegaly or mass.  EXTREMITIES: No cyanosis, clubbing or edema b/l.    NEUROLOGIC: nonfocal PSYCHIATRIC:  patient is alert and oriented x 3.  SKIN: No obvious rash, lesion, or ulcer.   LABORATORY PANEL:  CBC Recent Labs  Lab 12/14/20 1208 12/14/20 2007 12/15/20 1538  WBC 6.1  --   --   HGB 6.6*   < > 7.3*  HCT 22.3*   < > 24.3*  PLT 256  --   --    < > = values in this interval not displayed.    Chemistries  Recent Labs  Lab 12/14/20 1208 12/15/20 0721  NA 136 134*  K 4.0 4.3  CL 93* 93*  CO2 34* 36*  GLUCOSE 91 85  BUN 38* 31*  CREATININE 1.59* 1.54*  CALCIUM 8.6* 8.4*  AST 22  --   ALT 18  --   ALKPHOS 71  --   BILITOT 0.9  --    Cardiac Enzymes No results for input(s): TROPONINI in the last 168 hours. RADIOLOGY:  CT ABDOMEN PELVIS WO CONTRAST  Result Date: 12/15/2020 CLINICAL DATA:  A 70 year old female presents with history of potential duodenal ulcer. Abnormality discovered on EGD. EXAM: CT ABDOMEN AND PELVIS WITHOUT CONTRAST TECHNIQUE: Multidetector CT imaging of the abdomen and pelvis was performed following the standard  protocol without IV contrast. COMPARISON:  December 17, 2018. FINDINGS: Lower chest: Basilar scarring atelectasis. Marked cardiac enlargement as on previous imaging, heart is incompletely imaged. No pericardial effusion. Hepatobiliary: Smooth hepatic contours. Post cholecystectomy without gross biliary duct distension. Pancreas: Unremarkable grossly without signs of adjacent inflammation. Spleen: Normal size and contour. Adrenals/Urinary Tract: Adrenal glands are normal. Perinephric stranding which is symmetric and bilateral. No associated hydronephrosis. No nephrolithiasis. No ureteral or renal calculi. No stranding adjacent to the urinary bladder. 8 mm intermediate to high density area in the posterior interpolar RIGHT kidney is more likely hemorrhagic cyst but measures less than 70 Hounsfield units. Stomach/Bowel: Stomach without distension or substantial surrounding stranding. A collection of gas adjacent to the descending duodenum measuring 2.2 x 1.9 cm was present on previous imaging and was filled with debris compatible with duodenal diverticulum along the second portion of the duodenum. No extraluminal gas in the area. No substantial surrounding stranding. No small bowel dilation. No pericolonic inflammation Vascular/Lymphatic: Aortic atherosclerosis. No sign of aneurysm. Smooth contour of the IVC. There is no gastrohepatic or hepatoduodenal ligament lymphadenopathy. No retroperitoneal or mesenteric lymphadenopathy. No pelvic sidewall lymphadenopathy. Limited assessment of retroperitoneal and vascular structures due to lack of intravenous contrast. Reproductive: Unremarkable by CT.  No adnexal mass. Other: Post ventral abdominal wall reconstruction. LEFT inguinal hernia is small and contains only fat. No ascites. No free air or gas dissecting through the retroperitoneum. Musculoskeletal: No acute bone finding. No destructive bone process. Spinal degenerative changes. 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 No acute findings. 8 mm lesion along the posterior interpolar RIGHT kidney is likely hemorrhagic cyst but is indeterminate based on current imaging. Consider nonemergent follow-up sonogram for complete characterization. Marked cardiac enlargement as on previous imaging, heart is incompletely imaged. Aortic atherosclerosis. Aortic Atherosclerosis (ICD10-I70.0). Electronically Signed   By: Zetta Bills M.D.   On: 12/15/2020 15:30   ASSESSMENT AND PLAN:  OUMOU SMEAD is a 70 y.o. 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  Acute slow GI  blood loss anemia iron deficiency anemia/low normal B12 --suspect From gastrointestinal source.  Patient has had maroon-colored stools --Patient has a baseline hemoglobin of 9.5g/dl and today on admission hemoglobin is 6.6g/dl--1 unit BT--7.7 --Continue IV Protonix --s/p EGD--Duodenal deformity.                        - Normal stomach.                        -  Small hiatal hernia.                        - Normal gastroesophageal junction and esophagus.  --colonoscopy --poor prep --CT abd/pelvis per GI--stable duodenal diverticulum -- start patient on IV iron and oral iron supplement -- PO B 12   Chronic atrial fibrillation --Continue digoxin, amiodarone and carvedilol for rate control --Hold apixaban due to anemia   Chronic systolic heart failure --Not acutely exacerbated --Continue carvedilol, spironolactone and Demadex --Patient not on an ACE inhibitor due to relative hypotension    COPD with chronic respiratory failure --Not acutely exacerbated -Continue 3 L of oxygen to  maintain pulse oximetry greater than 94% --Continue as needed bronchodilator therapy as well as inhaled steroids    Nonischemic cardiomyopathy Last known LVEF is 20 to 25% from a 2D echocardiogram that was done 08/22 Patient is status post AICD insertion  Procedures: Family communication : none today Consults : G.I. CODE STATUS: full DVT Prophylaxis : SCD Level of care: Med-Surg Status is: Inpatient  Remains inpatient appropriate because: anemia workup        TOTAL TIME TAKING CARE OF THIS PATIENT: 25 minutes.  >50% time spent on counselling and coordination of care  Note: This dictation was prepared with Dragon dictation along with smaller phrase technology. Any transcriptional errors that result from this process are unintentional.  Fritzi Mandes M.D    [facility]   CC: Primary care physician; Jon Billings, NP Patient ID: [name], female   DOB: 1950-03-26, 70 y.o.   MRN: [id number]

## 2020-12-15 NOTE — ED Notes (Signed)
Pt assisted to bedside toilet and back to bed with monitor back in place.

## 2020-12-15 NOTE — Anesthesia Preprocedure Evaluation (Addendum)
Anesthesia Evaluation  Patient identified by MRN, date of birth, ID band Patient awake    Reviewed: Allergy & Precautions, NPO status , Patient's Chart, lab work & pertinent test results  History of Anesthesia Complications Negative for: history of anesthetic complications  Airway Mallampati: II  TM Distance: >3 FB Neck ROM: Full    Dental  (+) Upper Dentures, Partial Lower   Pulmonary shortness of breath and at rest, neg sleep apnea, COPD,  oxygen dependent, Patient abstained from smoking.Not current smoker, former smoker,  On 3L/min O2    Pulmonary exam normal breath sounds clear to auscultation       Cardiovascular Exercise Tolerance: Poor METS: < 3 Mets hypertension, + CAD and +CHF  (-) Past MI + dysrhythmias Atrial Fibrillation + Cardiac Defibrillator + Valvular Problems/Murmurs MR  Rhythm:Regular Rate:Normal - Systolic murmurs Severe RV and LV dysfunction, ICD in place (usually Sinus rhythm with V pacing). TTE 08/2020: Marland Kitchen Limited study due to patient's respiratory distress.. Left ventricular  ejection fraction, by estimation, is 20 to 25%. The left ventricle has  severely decreased function. The left ventricular internal cavity size was  severely dilated. Left  ventricular diastolic function could not be evaluated.  2. Right ventricular systolic function is severely reduced. The right  ventricular size is severely enlarged.  3. Left atrial size was severely dilated. No left atrial/left atrial  appendage thrombus was detected.  4. Right atrial size was severely dilated.  5. A small pericardial effusion is present. The pericardial effusion is  circumferential. There is no evidence of cardiac tamponade.  6. The mitral valve is grossly normal. Moderate mitral valve  regurgitation.  7. Tricuspid valve regurgitation is severe.  8. The aortic valve is tricuspid. There is severe calcifcation of the  aortic valve. There  is moderate thickening of the aortic valve. Aortic  valve regurgitation is mild.  9. There is Moderate (Grade III) atheroma plaque involving the aortic  root and ascending aorta.    Neuro/Psych  Headaches, PSYCHIATRIC DISORDERS Anxiety    GI/Hepatic neg GERD  ,(+)     (-) substance abuse  ,   Endo/Other  neg diabetesHypothyroidism   Renal/GU CRFRenal disease     Musculoskeletal   Abdominal   Peds  Hematology  (+) anemia ,   Anesthesia Other Findings Past Medical History: 02/17/2018: Acute appendicitis No date: AICD (automatic cardioverter/defibrillator) present No date: Anxiety state No date: Arrhythmia     Comment:  atrial fibrillation No date: CAD (coronary artery disease) No date: Cardiomyopathy ([place]) No date: CHF (congestive heart failure) (HCC) No date: COPD (chronic obstructive pulmonary disease) (HCC) No date: Dyspnea No date: Headache No date: History of kidney stones No date: History of shingles No date: Hypertension No date: On home oxygen therapy     Comment:  bedtime and prn No date: Restless leg syndrome   Reproductive/Obstetrics                           Anesthesia Physical Anesthesia Plan  ASA: 4  Anesthesia Plan: General   Post-op Pain Management: Minimal or no pain anticipated   Induction: Intravenous  PONV Risk Score and Plan: 3 and Propofol infusion, TIVA, Ondansetron, Midazolam and Treatment may vary due to age or medical condition  Airway Management Planned: Nasal Cannula  Additional Equipment: None  Intra-op Plan:   Post-operative Plan:   Informed Consent: I have reviewed the patients History and Physical, chart, labs and discussed the procedure  including the risks, benefits and alternatives for the proposed anesthesia with the patient or authorized representative who has indicated his/her understanding and acceptance.     Dental advisory given  Plan Discussed with: CRNA and Surgeon  Anesthesia  Plan Comments: (Discussed risks of anesthesia with patient, including possibility of difficulty with spontaneous ventilation under anesthesia necessitating airway intervention, PONV, and rare risks such as cardiac or respiratory or neurological events, and allergic reactions. Patient counseled on being higher risk for anesthesia due to comorbidities: severe left and right sided heart failure, severe valvular disease, oxygen dependent COPD. Patient was told about increased risk of cardiac and respiratory events, including death.  Discussed the role of CRNA in patient's perioperative care. Patient understands.)       Anesthesia Quick Evaluation

## 2020-12-16 ENCOUNTER — Inpatient Hospital Stay: Payer: Medicare Other | Admitting: Anesthesiology

## 2020-12-16 ENCOUNTER — Encounter: Payer: Self-pay | Admitting: Gastroenterology

## 2020-12-16 ENCOUNTER — Encounter
Admission: EM | Disposition: A | Discharge status: Home / Self Care | Payer: Self-pay | Source: Home / Self Care | Attending: Internal Medicine

## 2020-12-16 HISTORY — PX: COLONOSCOPY WITH PROPOFOL: SHX5780

## 2020-12-16 SURGERY — COLONOSCOPY WITH PROPOFOL
Anesthesia: General

## 2020-12-16 MED ORDER — SODIUM CHLORIDE 0.9 % IV SOLN: INTRAVENOUS | Status: DC

## 2020-12-16 MED ORDER — PROPOFOL 10 MG/ML IV BOLUS
INTRAVENOUS | Status: AC
  Filled 2020-12-16: qty 20

## 2020-12-16 MED ORDER — POLYETHYLENE GLYCOL 3350 17 GM/SCOOP PO POWD
1.0000 | Freq: Once | ORAL | Status: AC
  Administered 2020-12-16: 255 g via ORAL
  Filled 2020-12-16 (×2): qty 255

## 2020-12-16 MED ORDER — POLYSACCHARIDE IRON COMPLEX 150 MG PO CAPS: 150.0000 mg | ORAL_CAPSULE | Freq: Every day | ORAL | 0 refills | Status: AC

## 2020-12-16 MED ORDER — PANTOPRAZOLE SODIUM 40 MG PO TBEC: 40.0000 mg | DELAYED_RELEASE_TABLET | Freq: Every day | ORAL | 1 refills | Status: DC

## 2020-12-16 MED ORDER — LIDOCAINE 2% (20 MG/ML) 5 ML SYRINGE
INTRAMUSCULAR | Status: DC | PRN
  Administered 2020-12-16: 20 mg via INTRAVENOUS

## 2020-12-16 MED ORDER — PROPOFOL 500 MG/50ML IV EMUL
INTRAVENOUS | Status: DC | PRN
Start: 2020-12-16 — End: 2020-12-16
  Administered 2020-12-16: 120 ug/kg/min via INTRAVENOUS

## 2020-12-16 MED ORDER — PROPOFOL 10 MG/ML IV BOLUS
INTRAVENOUS | Status: DC | PRN
  Administered 2020-12-16: 50 mg via INTRAVENOUS

## 2020-12-16 MED ORDER — CYANOCOBALAMIN 1000 MCG PO TABS: 1000.0000 ug | ORAL_TABLET | Freq: Every day | ORAL | 2 refills | Status: AC

## 2020-12-16 NOTE — Transfer of Care (Signed)
Immediate Anesthesia Transfer of Care Note  Patient: Maria Neal  Procedure(s) Performed: COLONOSCOPY WITH PROPOFOL  Patient Location: PACU and Endoscopy Unit  Anesthesia Type:General  Level of Consciousness: drowsy and patient cooperative  Airway & Oxygen Therapy: Patient Spontanous Breathing and Patient connected to nasal cannula oxygen  Post-op Assessment: Report given to RN and Post -op Vital signs reviewed and stable  Post vital signs: Reviewed and stable  Last Vitals:  Vitals Value Taken Time  BP 113/40 12/16/20 1306  Temp 36.1 C 12/16/20 1306  Pulse 69 12/16/20 1307  Resp 17 12/16/20 1307  SpO2 99 % 12/16/20 1307  Vitals shown include unvalidated device data.  Last Pain:  Vitals:   12/16/20 1306  TempSrc: Temporal  PainSc:       Patients Stated Pain Goal: 0 (29/19/16 6060)  Complications: No notable events documented.

## 2020-12-16 NOTE — Op Note (Signed)
[facility] Gastroenterology Patient Name: Maria Neal Procedure Date: 12/16/2020 12:31 PM MRN: [id number] Account #: [id number] Date of Birth: 1950-03-09 Admit Type: Inpatient Age: 70 Room: [facility] ENDO ROOM 4 Gender: Female Note Status: Finalized Instrument Name: Jasper Riling [id number] Procedure:             Colonoscopy Indications:           Hematochezia Providers:             Lucilla Lame MD, MD Referring MD:          Jon Billings (Referring MD) Medicines:             Propofol per Anesthesia Complications:         No immediate complications. Procedure:             Pre-Anesthesia Assessment:                        - Prior to the procedure, a History and Physical was                         performed, and patient medications and allergies were                         reviewed. The patient's tolerance of previous                         anesthesia was also reviewed. The risks and benefits                         of the procedure and the sedation options and risks                         were discussed with the patient. All questions were                         answered, and informed consent was obtained. Prior                         Anticoagulants: The patient has taken no previous                         anticoagulant or antiplatelet agents. ASA Grade                         Assessment: II - A patient with mild systemic disease.                         After reviewing the risks and benefits, the patient                         was deemed in satisfactory condition to undergo the                         procedure.                        After obtaining informed consent, the colonoscope was  passed under direct vision. Throughout the procedure,                         the patient's blood pressure, pulse, and oxygen                         saturations were monitored continuously. The                         Colonoscope was introduced through  the anus and                         advanced to the the cecum, identified by appendiceal                         orifice and ileocecal valve. The colonoscopy was                         performed without difficulty. The patient tolerated                         the procedure well. The quality of the bowel                         preparation was good. Findings:      The perianal and digital rectal examinations were normal.      A 9 mm polyp was found in the cecum. The polyp was pedunculated. The       polyp was removed with a cold snare. Resection and retrieval were       complete.      A 5 mm polyp was found in the descending colon. The polyp was sessile.       The polyp was removed with a cold snare. Resection and retrieval were       complete.      A 5 mm polyp was found in the ascending colon. The polyp was sessile.       The polyp was removed with a cold snare. Resection and retrieval were       complete.      A few small-mouthed diverticula were found in the sigmoid colon. Impression:            - One 9 mm polyp in the cecum, removed with a cold                         snare. Resected and retrieved.                        - One 5 mm polyp in the descending colon, removed with                         a cold snare. Resected and retrieved.                        - One 5 mm polyp in the ascending colon, removed with                         a cold snare. Resected and retrieved.                        -  Diverticulosis in the sigmoid colon. Recommendation:        - Return patient to hospital ward for ongoing care.                        - Resume regular diet.                        - Continue present medications.                        - Await pathology results. Procedure Code(s):     --- Professional ---                        [phone], Colonoscopy, flexible; with removal of                         tumor(s), polyp(s), or other lesion(s) by snare                          technique Diagnosis Code(s):     --- Professional ---                        K92.1, Melena (includes Hematochezia)                        K63.5, Polyp of colon CPT copyright 2019 American Medical Association. All rights reserved. The codes documented in this report are preliminary and upon coder review may  be revised to meet current compliance requirements. Lucilla Lame MD, MD 12/16/2020 1:29:02 PM This report has been signed electronically. Number of Addenda: 0 Note Initiated On: 12/16/2020 12:31 PM Scope Withdrawal Time: 0 hours 6 minutes 1 second  Total Procedure Duration: 0 hours 12 minutes 39 seconds  Estimated Blood Loss:  Estimated blood loss: none.      [facility]

## 2020-12-16 NOTE — Anesthesia Preprocedure Evaluation (Addendum)
Anesthesia Evaluation  Patient identified by MRN, date of birth, ID band Patient awake    Reviewed: Allergy & Precautions, NPO status , Patient's Chart, lab work & pertinent test results  History of Anesthesia Complications Negative for: history of anesthetic complications  Airway Mallampati: I  TM Distance: >3 FB Neck ROM: Full    Dental  (+) Upper Dentures, Edentulous Lower   Pulmonary shortness of breath and at rest, neg sleep apnea, COPD,  oxygen dependent, Patient abstained from smoking.Not current smoker, former smoker,  On 3L/min O2   3L [place]   (-) wheezing      Cardiovascular Exercise Tolerance: Poor METS: < 3 Mets hypertension, + CAD and +CHF  (-) Past MI + dysrhythmias Atrial Fibrillation + Cardiac Defibrillator + Valvular Problems/Murmurs MR  Rhythm:Regular Rate:Normal + Systolic murmurs Severe RV and LV dysfunction, ICD in place (usually Sinus rhythm with V pacing). TTE 08/2020: Marland Kitchen Limited study due to patient's respiratory distress.. Left ventricular  ejection fraction, by estimation, is 20 to 25%. The left ventricle has  severely decreased function. The left ventricular internal cavity size was  severely dilated. Left  ventricular diastolic function could not be evaluated.  2. Right ventricular systolic function is severely reduced. The right  ventricular size is severely enlarged.  3. Left atrial size was severely dilated. No left atrial/left atrial  appendage thrombus was detected.  4. Right atrial size was severely dilated.  5. A small pericardial effusion is present. The pericardial effusion is  circumferential. There is no evidence of cardiac tamponade.  6. The mitral valve is grossly normal. Moderate mitral valve  regurgitation.  7. Tricuspid valve regurgitation is severe.  8. The aortic valve is tricuspid. There is severe calcifcation of the  aortic valve. There is moderate thickening of the aortic  valve. Aortic  valve regurgitation is mild.  9. There is Moderate (Grade III) atheroma plaque involving the aortic  root and ascending aorta.    Neuro/Psych  Headaches, PSYCHIATRIC DISORDERS Anxiety    GI/Hepatic neg GERD  ,(+)     (-) substance abuse  , Melena, acute blood loss anemia   Endo/Other  neg diabetesHypothyroidism   Renal/GU CRFRenal disease     Musculoskeletal   Abdominal   Peds  Hematology  (+) anemia ,   Anesthesia Other Findings Past Medical History: 02/17/2018: Acute appendicitis No date: AICD (automatic cardioverter/defibrillator) present No date: Anxiety state No date: Arrhythmia     Comment:  atrial fibrillation No date: CAD (coronary artery disease) No date: Cardiomyopathy ([place]) No date: CHF (congestive heart failure) (HCC) No date: COPD (chronic obstructive pulmonary disease) (HCC) No date: Dyspnea No date: Headache No date: History of kidney stones No date: History of shingles No date: Hypertension No date: On home oxygen therapy     Comment:  bedtime and prn No date: Restless leg syndrome   Reproductive/Obstetrics                            Anesthesia Physical  Anesthesia Plan  ASA: 4  Anesthesia Plan: General   Post-op Pain Management: Minimal or no pain anticipated   Induction: Intravenous  PONV Risk Score and Plan: 3 and Propofol infusion, TIVA and Treatment may vary due to age or medical condition  Airway Management Planned: Nasal Cannula and Natural Airway  Additional Equipment: None  Intra-op Plan:   Post-operative Plan:   Informed Consent: I have reviewed the patients History and Physical, chart, labs  and discussed the procedure including the risks, benefits and alternatives for the proposed anesthesia with the patient or authorized representative who has indicated his/her understanding and acceptance.     Dental advisory given  Plan Discussed with: CRNA and  Anesthesiologist  Anesthesia Plan Comments: (.)       Anesthesia Quick Evaluation

## 2020-12-16 NOTE — TOC Initial Note (Signed)
Transition of Care [facility]) - Initial/Assessment Note    Patient Details  Name: Maria Neal MRN: [id number] Date of Birth: 10/02/1950  Transition of Care [facility]) CM/SW Contact:    Conception Oms, RN Phone Number: 12/16/2020, 9:06 AM  Clinical Narrative:                  Transition of Care [facility]) Screening Note   Patient Details  Name: Maria Neal Date of Birth: 01-14-1950   Transition of Care [facility]) CM/SW Contact:    Conception Oms, RN Phone Number: 12/16/2020, 9:06 AM    Transition of Care Department [facility]) has reviewed patient and no TOC needs have been identified at this time. We will continue to monitor patient advancement through interdisciplinary progression rounds. If new patient transition needs arise, please place a TOC consult.          Patient Goals and CMS Choice        Expected Discharge Plan and Services                                                Prior Living Arrangements/Services                       Activities of Daily Living Home Assistive Devices/Equipment: Hand-held shower hose, Shower chair with back, Grab bars in shower, Walker (specify type) ADL Screening (condition at time of admission) Patient's cognitive ability adequate to safely complete daily activities?: Yes Is the patient deaf or have difficulty hearing?: No Does the patient have difficulty seeing, even when wearing glasses/contacts?: No Does the patient have difficulty concentrating, remembering, or making decisions?: No Patient able to express need for assistance with ADLs?: Yes Does the patient have difficulty dressing or bathing?: No Independently performs ADLs?: Yes (appropriate for developmental age) Does the patient have difficulty walking or climbing stairs?: No Weakness of Legs: None Weakness of Arms/Hands: None  Permission Sought/Granted                  Emotional Assessment              Admission diagnosis:  Acute blood loss  anemia [D62] Acute GI bleeding [K92.2] Acute blood loss anemia (ABLA) [D62] GI bleed [K92.2] Patient Active Problem List   Diagnosis Date Noted   GI bleed 12/15/2020   Acute blood loss anemia (ABLA) 12/14/2020   Acute GI bleeding    Aortic atherosclerosis ([place]) 12/13/2020   Thrombophilia ([place]) 12/13/2020   COPD exacerbation ([place]) 11/04/2020   Chronic respiratory failure with hypoxia ([place]) - 3 L/min 11/04/2020   Influenza A 11/04/2020   Acute on chronic systolic CHF (congestive heart failure) ([place]) 11/04/2020   Acute on chronic respiratory failure with hypoxia ([place]) - home O2 @ 3 L/min 11/04/2020   Chronic a-fib ([place]) 11/04/2020   Hypothyroidism 10/14/2020   Current use of long term anticoagulation 63/89/3734   Acute systolic heart failure ([place]) 08/07/2020   Medication management 06/25/2020   Chronic kidney disease, stage 3a ([place]) 09/04/2019   IFG (impaired fasting glucose) 04/01/2019   Leg pain 07/23/2018   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/23/2018   Chronic venous insufficiency 07/23/2018   Simple endometrial hyperplasia without atypia 07/04/2018   Epigastric pain 06/28/2018   Cervical radiculitis 05/17/2017   Hypercholesteremia 05/02/2017   Obesity (BMI  30-39.9) 05/02/2017   Mitral valve insufficiency 04/16/2017   Closed fracture of lateral malleolus 38/93/7342   Chronic systolic congestive heart failure, NYHA class 3 ([place]) 08/07/2016   NICM (nonischemic cardiomyopathy) ([place]) 08/07/2016   S/P ICD (internal cardiac defibrillator) procedure 12/02/2014   Bundle branch block, left 10/02/2014   Benign essential HTN 05/07/2014   CAD (coronary artery disease) 05/07/2014   COPD, severe ([place]) 05/07/2014   S/P cardiac catheterization 04/23/2014   SOB (shortness of breath) on exertion 11/06/2013   PCP:  Jon Billings, NP Pharmacy:   [facility] [address], [place] - [place] [address] Phone: [phone]  Fax: [place], [address] [place] [address] Phone: [phone] Fax: [place], [place]. [place] [address] Phone: [phone] Fax: [phone]     Social Determinants of Health (SDOH) Interventions    Readmission Risk Interventions No flowsheet data found.

## 2020-12-16 NOTE — Discharge Summary (Signed)
[place] at [place] NAME: Maria Neal    MR#:  [id number]  DATE OF BIRTH:  Nov 25, 1950  DATE OF ADMISSION:  12/14/2020 ADMITTING PHYSICIAN: Fritzi Mandes, MD  DATE OF DISCHARGE: 12/16/2020  PRIMARY CARE PHYSICIAN: Maria Billings, NP    ADMISSION DIAGNOSIS:  Acute blood loss anemia [D62] Acute GI bleeding [K92.2] Acute blood loss anemia (ABLA) [D62] GI bleed [K92.2]  DISCHARGE DIAGNOSIS:  Acute on chronic slow G.I. bleed suspect diverticular  SECONDARY DIAGNOSIS:   Past Medical History:  Diagnosis Date   Acute appendicitis 02/17/2018   AICD (automatic cardioverter/defibrillator) present    Anxiety state    Arrhythmia    atrial fibrillation   CAD (coronary artery disease)    Cardiomyopathy ([place])    CHF (congestive heart failure) (HCC)    COPD (chronic obstructive pulmonary disease) (HCC)    Dyspnea    Headache    History of kidney stones    History of shingles    Hypertension    On home oxygen therapy    bedtime and prn   Restless leg syndrome     HOSPITAL COURSE:  Maria Neal is a 70 y.o. 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   Acute slow GI  blood loss anemia iron deficiency anemia/low normal B12 --suspect From gastrointestinal source.  Patient has had maroon-colored stools --Patient has a baseline hemoglobin of 9.5g/dl and today on admission hemoglobin is 6.6g/dl--1 unit BT--7.7 --Continue IV Protonix --s/p EGD--Duodenal deformity.                        - Normal stomach.                        - Small hiatal hernia.                        - Normal gastroesophageal junction and esophagus.   --colonoscopy --poor prep --CT abd/pelvis per GI--stable duodenal diverticulum -- start patient on IV iron and oral iron supplement -- PO B 12 -- colonoscopy- One 9 mm polyp in the cecum, removed with a cold                         snare. Resected and retrieved.                        - One 5 mm polyp in the descending colon, removed with                         a cold snare. Resected and retrieved.                        - One 5 mm polyp in the ascending colon, removed with                         a cold snare. Resected and retrieved.                        -  Diverticulosis in the sigmoid colon. -- G.I. bleed presumed diverticular -- per G.I. Bill further workup needed.   Chronic atrial fibrillation --Continue digoxin, amiodarone and carvedilol for rate control --Hold apixaban due to anemia-- Dr. Allen Norris agrees with that. Patient will follow-up with G.I. and PCP/cardiology to determine if okay to start blood thinners down the road. This message was discussed with patient's daughter on the phone    Chronic systolic heart failure --Not acutely exacerbated --Continue carvedilol, spironolactone and Demadex --Patient not on an ACE inhibitor due to relative hypotension    COPD with chronic respiratory failure --Not acutely exacerbated -Continue 3 L of oxygen to maintain pulse oximetry greater than 94% --Continue as needed bronchodilator therapy as well as inhaled steroids    Nonischemic cardiomyopathy Last known LVEF is 20 to 25% from a 2D echocardiogram that was done 08/22 Patient is status post AICD insertion   Procedures: EGD colonoscopy Family communication : daughter Maria Neal Consults : G.I. CODE STATUS: full DVT Prophylaxis : SCD Level of care: Med-Surg Status is: Inpatient   discharge patient home    CONSULTS OBTAINED:  Treatment Team:  Lucilla Lame, MD  DRUG ALLERGIES:   Allergies  Allergen Reactions   Levaquin [Levofloxacin] Anaphylaxis   Amoxicillin Hives     Did it involve swelling of the face/tongue/throat, SOB, or low BP? No Did it involve sudden or severe rash/hives, skin peeling, or any reaction on the inside of your mouth or nose? No Did you need to seek medical attention at a hospital or doctor's office? No When did it last happen?      10+ years If all above answers are "NO", may proceed with cephalosporin use.   Nitrofuran Derivatives Other (See Comments)    Unknown   Penicillins Other (See Comments)    Thinks it made her itch a lot, but isn't sure. Did it involve swelling of the face/tongue/throat, SOB, or low BP? No Did it involve sudden or severe rash/hives, skin peeling, or any reaction on the inside of your mouth or nose? No Did you need to seek medical attention at a hospital or doctor's office? No When did it last happen?      10+ years If all above answers are "NO", may proceed with cephalosporin use.    Zithromax [Azithromycin] Other (See Comments)   Antihistamines, Chlorpheniramine-Type Rash    DISCHARGE MEDICATIONS:   Allergies as of 12/16/2020       Reactions   Levaquin [levofloxacin] Anaphylaxis   Amoxicillin Hives   Did it involve swelling of the face/tongue/throat, SOB, or low BP? No Did it involve sudden or severe rash/hives, skin peeling, or any reaction on the inside of your mouth or nose? No Did you need to seek medical attention at a hospital or doctor's office? No When did it last happen?      10+ years If all above answers are "NO", may proceed with cephalosporin use.   Nitrofuran Derivatives Other (See Comments)   Unknown   Penicillins Other (See Comments)   Thinks it made her itch a lot, but isn't sure. Did it involve swelling of the face/tongue/throat, SOB, or low BP? No Did it involve sudden or severe rash/hives, skin peeling, or any reaction on the inside of your mouth or nose? No Did you need to seek medical attention at a hospital or doctor's office? No When did it last happen?      10+  years If all above answers are "NO", may proceed with cephalosporin use.  Zithromax [azithromycin] Other (See Comments)   Antihistamines, Chlorpheniramine-type Rash        Medication List     STOP taking these medications    apixaban 5 MG Tabs tablet Commonly known as: ELIQUIS       TAKE these medications    albuterol 108 (90 Base) MCG/ACT inhaler Commonly known as: VENTOLIN HFA Inhale 2 puffs into the lungs every 6 (six) hours as needed for wheezing or shortness of breath.   alum & mag hydroxide-simeth 200-200-20 MG/5ML suspension Commonly known as: MAALOX/MYLANTA Take 30 mLs by mouth every 4 (four) hours as needed for indigestion or heartburn.   amiodarone 200 MG tablet Commonly known as: PACERONE Take 1 tablet (200 mg total) by mouth daily. What changed: how much to take   calcitRIOL 0.25 MCG capsule Commonly known as: ROCALTROL Take 0.25 mcg by mouth daily.   carvedilol 6.25 MG tablet Commonly known as: COREG Take 6.25 mg by mouth 2 (two) times daily.   cyanocobalamin 1000 MCG tablet Take 1 tablet (1,000 mcg total) by mouth daily.   Digoxin 62.5 MCG Tabs Take 0.0625 mg by mouth daily.   fluticasone 50 MCG/ACT nasal spray Commonly known as: FLONASE Place 2 sprays into both nostrils daily.   gabapentin 100 MG capsule Commonly known as: NEURONTIN Take 1 capsule (100 mg total) by mouth at bedtime.   iron polysaccharides 150 MG capsule Commonly known as: NIFEREX Take 1 capsule (150 mg total) by mouth daily.   levothyroxine 175 MCG tablet Commonly known as: SYNTHROID Take 1 tablet (175 mcg total) by mouth daily.   midodrine 5 MG tablet Commonly known as: PROAMATINE Take 1 tablet (5 mg total) by mouth 3 (three) times daily with meals.   nystatin cream Commonly known as: MYCOSTATIN Apply 1 application topically 2 (two) times daily.   OXYGEN Inhale 3 L into the lungs 3 (three) times daily as needed (shortness of breath or coughing).    pantoprazole 40 MG tablet Commonly known as: Protonix Take 1 tablet (40 mg total) by mouth daily.   spironolactone 25 MG tablet Commonly known as: ALDACTONE Take 12.5 mg by mouth daily.   torsemide 20 MG tablet Commonly known as: DEMADEX Take 20 mg by mouth daily.   Trelegy Ellipta 100-62.5-25 MCG/ACT Aepb Generic drug: Fluticasone-Umeclidin-Vilant Inhale 1 puff into the lungs daily.   triamcinolone 0.025 % ointment Commonly known as: KENALOG Apply 1 application topically 2 (two) times daily.        If you experience worsening of your admission symptoms, develop shortness of breath, life threatening emergency, suicidal or homicidal thoughts you must seek medical attention immediately by calling 911 or calling your MD immediately  if symptoms less severe.  You Must read complete instructions/literature along with all the possible adverse reactions/side effects for all the Medicines you take and that have been prescribed to you. Take any new Medicines after you have completely understood and accept all the possible adverse reactions/side effects.   Please note  You were cared for by a hospitalist during your hospital stay. If you have any questions about your discharge medications or the care you received while you were in the hospital after you are discharged, you can call the unit and asked to speak with the hospitalist on call if the hospitalist that took care of you is not available. Once you are discharged, your primary care physician will handle any further medical issues. Please note that NO REFILLS for any discharge medications will be authorized once  you are discharged, as it is imperative that you return to your primary care physician (or establish a relationship with a primary care physician if you do not have one) for your aftercare needs so that they can reassess your need for medications and monitor your lab values. Today   SUBJECTIVE   no new complaints. Got back  from procedure. Wants to go home.  VITAL SIGNS:  Blood pressure 91/64, pulse 70, temperature 97.7 F (36.5 C), resp. rate 18, height 5\' 4"  (1.626 m), weight 68 kg, SpO2 100 %.  I/O:   Intake/Output Summary (Last 24 hours) at 12/16/2020 1529 Last data filed at 12/16/2020 0655 Gross per 24 hour  Intake 241.26 ml  Output --  Net 241.26 ml    PHYSICAL EXAMINATION:  GENERAL:  70 y.o.-year-old patient lying in the bed with no acute distress. Pallor+ LUNGS: Normal breath sounds bilaterally, no wheezing, rales,rhonchi or crepitation. No use of accessory muscles of respiration.  CARDIOVASCULAR: S1, S2 normal. No murmurs, rubs, or gallops.  ABDOMEN: Soft, non-tender, non-distended. Bowel sounds present. No organomegaly or mass.  EXTREMITIES: No pedal edema, cyanosis, or clubbing.  NEUROLOGIC: non-focal PSYCHIATRIC:  patient is alert and oriented x 3.  SKIN: No obvious rash, lesion, or ulcer.   DATA REVIEW:   CBC  Recent Labs  Lab 12/14/20 1208 12/14/20 2007 12/15/20 2100  WBC 6.1  --   --   HGB 6.6*   < > 7.9*  HCT 22.3*   < > 26.0*  PLT 256  --   --    < > = values in this interval not displayed.    Chemistries  Recent Labs  Lab 12/14/20 1208 12/15/20 0721  NA 136 134*  K 4.0 4.3  CL 93* 93*  CO2 34* 36*  GLUCOSE 91 85  BUN 38* 31*  CREATININE 1.59* 1.54*  CALCIUM 8.6* 8.4*  AST 22  --   ALT 18  --   ALKPHOS 71  --   BILITOT 0.9  --     Microbiology Results   Recent Results (from the past 240 hour(s))  Resp Panel by RT-PCR (Flu A&B, Covid) Nasopharyngeal Swab     Status: None   Collection Time: 12/15/20  8:04 AM   Specimen: Nasopharyngeal Swab; Nasopharyngeal(NP) swabs in vial transport medium  Result Value Ref Range Status   SARS Coronavirus 2 by RT PCR NEGATIVE NEGATIVE Final    Comment: (NOTE) SARS-CoV-2 target nucleic acids are NOT DETECTED.  The SARS-CoV-2 RNA is generally detectable in upper respiratory specimens during the acute phase of  infection. The lowest concentration of SARS-CoV-2 viral copies this assay can detect is 138 copies/mL. A negative result does not preclude SARS-Cov-2 infection and should not be used as the sole basis for treatment or other patient management decisions. A negative result may occur with  improper specimen collection/handling, submission of specimen other than nasopharyngeal swab, presence of viral mutation(s) within the areas targeted by this assay, and inadequate number of viral copies(<138 copies/mL). A negative result must be combined with clinical observations, patient history, and epidemiological information. The expected result is Negative.  Fact Sheet for Patients:  EntrepreneurPulse.com.au  Fact Sheet for Healthcare Providers:  IncredibleEmployment.be  This test is no t yet approved or cleared by the [place] FDA and  has been authorized for detection and/or diagnosis of SARS-CoV-2 by FDA under an Emergency Use Authorization (EUA). This EUA will remain  in effect (meaning this test can be used) for the duration of  the COVID-19 declaration under Section 564(b)(1) of the Act, 21 U.S.C.section 360bbb-3(b)(1), unless the authorization is terminated  or revoked sooner.       Influenza A by PCR NEGATIVE NEGATIVE Final   Influenza B by PCR NEGATIVE NEGATIVE Final    Comment: (NOTE) The Xpert Xpress SARS-CoV-2/FLU/RSV plus assay is intended as an aid in the diagnosis of influenza from Nasopharyngeal swab specimens and should not be used as a sole basis for treatment. Nasal washings and aspirates are unacceptable for Xpert Xpress SARS-CoV-2/FLU/RSV testing.  Fact Sheet for Patients: EntrepreneurPulse.com.au  Fact Sheet for Healthcare Providers: IncredibleEmployment.be  This test is not yet approved or cleared by the [place] FDA and has been authorized for detection and/or diagnosis of SARS-CoV-2  by FDA under an Emergency Use Authorization (EUA). This EUA will remain in effect (meaning this test can be used) for the duration of the COVID-19 declaration under Section 564(b)(1) of the Act, 21 U.S.C. section 360bbb-3(b)(1), unless the authorization is terminated or revoked.  Performed at [facility], [address] [place], [place] [address]     RADIOLOGY:  CT ABDOMEN PELVIS WO CONTRAST  Result Date: 12/15/2020 CLINICAL DATA:  A 70 year old female presents with history of potential duodenal ulcer. Abnormality discovered on EGD. EXAM: CT ABDOMEN AND PELVIS WITHOUT CONTRAST TECHNIQUE: Multidetector CT imaging of the abdomen and pelvis was performed following the standard protocol without IV contrast. COMPARISON:  December 17, 2018. FINDINGS: Lower chest: Basilar scarring atelectasis. Marked cardiac enlargement as on previous imaging, heart is incompletely imaged. No pericardial effusion. Hepatobiliary: Smooth hepatic contours. Post cholecystectomy without gross biliary duct distension. Pancreas: Unremarkable grossly without signs of adjacent inflammation. Spleen: Normal size and contour. Adrenals/Urinary Tract: Adrenal glands are normal. Perinephric stranding which is symmetric and bilateral. No associated hydronephrosis. No nephrolithiasis. No ureteral or renal calculi. No stranding adjacent to the urinary bladder. 8 mm intermediate to high density area in the posterior interpolar RIGHT kidney is more likely hemorrhagic cyst but measures less than 70 Hounsfield units. Stomach/Bowel: Stomach without distension or substantial surrounding stranding. A collection of gas adjacent to the descending duodenum measuring 2.2 x 1.9 cm was present on previous imaging and was filled with debris compatible with duodenal diverticulum along the second portion of the duodenum. No extraluminal gas in the area. No substantial surrounding stranding. No small bowel dilation. No pericolonic inflammation  Vascular/Lymphatic: Aortic atherosclerosis. No sign of aneurysm. Smooth contour of the IVC. There is no gastrohepatic or hepatoduodenal ligament lymphadenopathy. No retroperitoneal or mesenteric lymphadenopathy. No pelvic sidewall lymphadenopathy. Limited assessment of retroperitoneal and vascular structures due to lack of intravenous contrast. Reproductive: Unremarkable by CT.  No adnexal mass. Other: Post ventral abdominal wall reconstruction. LEFT inguinal hernia is small and contains only fat. No ascites. No free air or gas dissecting through the retroperitoneum. Musculoskeletal: No acute bone finding. No destructive bone process. Spinal degenerative changes. 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 No acute findings. 8 mm lesion along the posterior interpolar RIGHT kidney is likely hemorrhagic cyst but is indeterminate based on current imaging. Consider nonemergent follow-up sonogram for complete characterization. Marked cardiac enlargement as on previous imaging, heart is incompletely imaged. Aortic atherosclerosis. Aortic Atherosclerosis (ICD10-I70.0). Electronically Signed   By: Zetta Bills M.D.   On: 12/15/2020 15:30     CODE STATUS:     Code Status Orders  (From admission, onward)           Start  Ordered   12/14/20 1522  Full code  Continuous        12/14/20 1522           Code Status History     Date Active Date Inactive Code Status Order ID Comments User Context   11/04/2020 2000 11/07/2020 1703 Full Code [id number]  Kristopher Oppenheim, DO Inpatient   11/04/2020 1257 11/04/2020 2000 Full Code [id number]  Kristopher Oppenheim, DO ED   09/14/2020 0906 09/14/2020 1652 Full Code [id number]  Isaias Cowman, MD Inpatient   08/07/2020 1654 08/13/2020 2323 Full Code [id number]  Dixie Dials, MD ED   06/27/2018 1002 06/27/2018 1726 Full Code [id number]  Malachy Mood, MD Inpatient   02/17/2018 1956 02/18/2018 1619 Full Code [id number]  Benjamine Sprague, DO Inpatient   05/07/2014 2321 05/09/2014 1624 Full Code [id number]  Hower, Aaron Mose, MD ED      Advance Directive Documentation    Flowsheet Row Most Recent Value  Type of Advance Directive Healthcare Power of Attorney  Pre-existing out of facility DNR order (yellow form or pink MOST form) --  "MOST" Form in Place? --        TOTAL TIME TAKING CARE OF THIS PATIENT: 35 minutes.    Fritzi Mandes M.D  [facility]    CC: Primary care physician; Maria Billings, NP

## 2020-12-17 ENCOUNTER — Encounter: Payer: Self-pay | Admitting: Gastroenterology

## 2020-12-17 LAB — HEMOGLOBIN AND HEMATOCRIT, BLOOD
HCT: 19.5 % — ABNORMAL LOW (ref 36.0–46.0)
Hemoglobin: 5.8 g/dL — ABNORMAL LOW (ref 12.0–15.0)

## 2020-12-17 LAB — SURGICAL PATHOLOGY

## 2020-12-17 LAB — HEMOGLOBIN
Hemoglobin: 6.4 g/dL — ABNORMAL LOW (ref 12.0–15.0)
Hemoglobin: 7.1 g/dL — ABNORMAL LOW (ref 12.0–15.0)

## 2020-12-17 LAB — PREPARE RBC (CROSSMATCH)

## 2020-12-17 MED ORDER — CAMPHOR-MENTHOL 0.5-0.5 % EX LOTN
TOPICAL_LOTION | CUTANEOUS | Status: DC | PRN
  Filled 2020-12-17: qty 222

## 2020-12-17 MED ORDER — SODIUM CHLORIDE 0.9% IV SOLUTION: Freq: Once | INTRAVENOUS | Status: AC

## 2020-12-17 MED ORDER — PANTOPRAZOLE SODIUM 40 MG PO TBEC
40.0000 mg | DELAYED_RELEASE_TABLET | Freq: Every day | ORAL | Status: DC
  Administered 2020-12-17 – 2020-12-18 (×2): 40 mg via ORAL
  Filled 2020-12-17: qty 1

## 2020-12-17 NOTE — TOC Progression Note (Signed)
Transition of Care [facility]) - Progression Note    Patient Details  Name: Maria Neal MRN: [id number] Date of Birth: Jul 22, 1950  Transition of Care [facility]) CM/SW [place], RN Phone Number: 12/17/2020, 2:33 PM  Clinical Narrative:   Patient is active with [facility] will need [place] orders for RN and PT         Expected Discharge Plan and Services                                                 Social Determinants of Health (SDOH) Interventions    Readmission Risk Interventions Readmission Risk Prevention Plan 12/17/2020  Transportation Screening Complete  PCP or Specialist Appt within 3-5 Days Complete  HRI or Home Care Consult Complete  Palliative Care Screening Not Applicable  Medication Review (RN Care Manager) Referral to Pharmacy  Some recent data might be hidden

## 2020-12-17 NOTE — Care Management Important Message (Signed)
Important Message  Patient Details  Name: Maria Neal MRN: [id number] Date of Birth: Mar 10, 1950   Medicare Important Message Given:  N/A - LOS <3 / Initial given by admissions     Juliann Pulse A Kayann Maj 12/17/2020, 10:03 AM

## 2020-12-17 NOTE — Anesthesia Postprocedure Evaluation (Signed)
Anesthesia Post Note  Patient: Maria Neal  Procedure(s) Performed: COLONOSCOPY WITH PROPOFOL  Patient location during evaluation: Endoscopy Anesthesia Type: General Level of consciousness: awake and alert Pain management: pain level controlled Vital Signs Assessment: post-procedure vital signs reviewed and stable Respiratory status: spontaneous breathing, nonlabored ventilation and respiratory function stable Cardiovascular status: blood pressure returned to baseline and stable Postop Assessment: no apparent nausea or vomiting Anesthetic complications: no   No notable events documented.   Last Vitals:  Vitals:   12/17/20 1222 12/17/20 1230  BP: (!) 107/50 110/66  Pulse: 68 72  Resp: 18 16  Temp: 36.6 C 36.7 C  SpO2: 100% 100%    Last Pain:  Vitals:   12/17/20 1230  TempSrc: Oral  PainSc:                  [place] Ouch

## 2020-12-17 NOTE — Progress Notes (Signed)
[facility] [place] NAME: Maria Neal    MR#:  [id number]  DATE OF BIRTH:  July 19, 1950  SUBJECTIVE:    REVIEW OF SYSTEMS:   ROS Tolerating Diet: Tolerating PT:   DRUG ALLERGIES:   Allergies  Allergen Reactions   Levaquin [Levofloxacin] Anaphylaxis   Amoxicillin Hives    Did it involve swelling of the face/tongue/throat, SOB, or low BP? No Did it involve sudden or severe rash/hives, skin peeling, or any reaction on the inside of your mouth or nose? No Did you need to seek medical attention at a hospital or doctor's office? No When did it last happen?      10+ years If all above answers are "NO", may proceed with cephalosporin use.   Nitrofuran Derivatives Other (See Comments)    Unknown   Penicillins Other (See Comments)    Thinks it made her itch a lot, but isn't sure. Did it involve swelling of the face/tongue/throat, SOB, or low BP? No Did it involve sudden or severe rash/hives, skin peeling, or any reaction on the inside of your mouth or nose? No Did you need to seek medical attention at a hospital or doctor's office? No When did it last happen?      10+ years If all above answers are "NO", may proceed with cephalosporin use.    Zithromax [Azithromycin] Other (See Comments)   Antihistamines, Chlorpheniramine-Type Rash    VITALS:  Blood pressure (!) 102/47, pulse 69, temperature 98 F (36.7 C), resp. rate 20, height 5\' 4"  (1.626 m), weight 68 kg, SpO2 97 %.  PHYSICAL EXAMINATION:   Physical Exam  GENERAL:  70 y.o.-year-old patient lying in the bed with no acute distress.  HEENT: Head atraumatic, normocephalic. Oropharynx and nasopharynx clear.  LUNGS: Normal breath sounds bilaterally, no wheezing, rales, rhonchi. No use of accessory muscles of respiration.  CARDIOVASCULAR: S1, S2 normal. No murmurs, rubs, or gallops.  ABDOMEN: Soft, nontender, nondistended. Bowel sounds present. No organomegaly or mass.  EXTREMITIES: No  cyanosis, clubbing or edema b/l.    NEUROLOGIC: nonfocal PSYCHIATRIC:  patient is alert and oriented x 3.  SKIN: No obvious rash, lesion, or ulcer.   LABORATORY PANEL:  CBC Recent Labs  Lab 12/14/20 1208 12/14/20 2007 12/17/20 0516  WBC 6.1  --   --   HGB 6.6*   < > 5.8*  HCT 22.3*   < > 19.5*  PLT 256  --   --    < > = values in this interval not displayed.    Chemistries  Recent Labs  Lab 12/14/20 1208 12/15/20 0721  NA 136 134*  K 4.0 4.3  CL 93* 93*  CO2 34* 36*  GLUCOSE 91 85  BUN 38* 31*  CREATININE 1.59* 1.54*  CALCIUM 8.6* 8.4*  AST 22  --   ALT 18  --   ALKPHOS 71  --   BILITOT 0.9  --    Cardiac Enzymes No results for input(s): TROPONINI in the last 168 hours. RADIOLOGY:  CT ABDOMEN PELVIS WO CONTRAST  Result Date: 12/15/2020 CLINICAL DATA:  A 70 year old female presents with history of potential duodenal ulcer. Abnormality discovered on EGD. EXAM: CT ABDOMEN AND PELVIS WITHOUT CONTRAST TECHNIQUE: Multidetector CT imaging of the abdomen and pelvis was performed following the standard protocol without IV contrast. COMPARISON:  December 17, 2018. FINDINGS: Lower chest: Basilar scarring atelectasis. Marked cardiac enlargement as on previous imaging, heart is incompletely imaged. No pericardial effusion. Hepatobiliary:  Smooth hepatic contours. Post cholecystectomy without gross biliary duct distension. Pancreas: Unremarkable grossly without signs of adjacent inflammation. Spleen: Normal size and contour. Adrenals/Urinary Tract: Adrenal glands are normal. Perinephric stranding which is symmetric and bilateral. No associated hydronephrosis. No nephrolithiasis. No ureteral or renal calculi. No stranding adjacent to the urinary bladder. 8 mm intermediate to high density area in the posterior interpolar RIGHT kidney is more likely hemorrhagic cyst but measures less than 70 Hounsfield units. Stomach/Bowel: Stomach without distension or substantial surrounding stranding. A  collection of gas adjacent to the descending duodenum measuring 2.2 x 1.9 cm was present on previous imaging and was filled with debris compatible with duodenal diverticulum along the second portion of the duodenum. No extraluminal gas in the area. No substantial surrounding stranding. No small bowel dilation. No pericolonic inflammation Vascular/Lymphatic: Aortic atherosclerosis. No sign of aneurysm. Smooth contour of the IVC. There is no gastrohepatic or hepatoduodenal ligament lymphadenopathy. No retroperitoneal or mesenteric lymphadenopathy. No pelvic sidewall lymphadenopathy. Limited assessment of retroperitoneal and vascular structures due to lack of intravenous contrast. Reproductive: Unremarkable by CT.  No adnexal mass. Other: Post ventral abdominal wall reconstruction. LEFT inguinal hernia is small and contains only fat. No ascites. No free air or gas dissecting through the retroperitoneum. Musculoskeletal: No acute bone finding. No destructive bone process. Spinal degenerative changes. 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 No acute findings. 8 mm lesion along the posterior interpolar RIGHT kidney is likely hemorrhagic cyst but is indeterminate based on current imaging. Consider nonemergent follow-up sonogram for complete characterization. Marked cardiac enlargement as on previous imaging, heart is incompletely imaged. Aortic atherosclerosis. Aortic Atherosclerosis (ICD10-I70.0). Electronically Signed   By: Zetta Bills M.D.   On: 12/15/2020 15:30   ASSESSMENT AND PLAN:  Maria Neal is a 70 y.o. 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   Acute slow GI  blood loss anemia iron deficiency anemia/low normal B12 --suspect From gastrointestinal source.  Patient has had maroon-colored stools --Patient has a baseline hemoglobin of 9.5g/dl and today on admission hemoglobin is 6.6g/dl--1 unit BT--7.7 --Continue IV Protonix --s/p EGD--Duodenal deformity.                        - Normal stomach.                        - Small hiatal hernia.                        - Normal gastroesophageal junction and esophagus.  --colonoscopy --poor prep --CT abd/pelvis per GI--stable duodenal diverticulum -- start patient on IV iron and oral iron supplement -- PO B 12 -- colonoscopy- One 9 mm polyp in the cecum, removed with a cold                         snare. Resected and retrieved.                        - One 5 mm polyp in the descending colon, removed with  a cold snare. Resected and retrieved.                        - One 5 mm polyp in the ascending colon, removed with                         a cold snare. Resected and retrieved.                        - Diverticulosis in the sigmoid colon. -- G.I. bleed presumed diverticular -- 12/16--hgb 5.8 today--repeat 6.4--1 unit BT today. Pt agreeable for BT   Chronic atrial fibrillation --Continue digoxin, amiodarone and carvedilol for rate control --Hold apixaban due to anemia-- Dr. Allen Norris agrees with that. Patient will follow-up with G.I. and PCP/cardiology to determine if okay to start blood thinners down the road. This message was discussed with patient's daughter on the phone    Chronic systolic heart failure --Not acutely exacerbated --Continue carvedilol, spironolactone and Demadex --Patient not on an ACE inhibitor due to relative hypotension    COPD with chronic respiratory failure --Not acutely exacerbated -Continue 3 L of oxygen to maintain pulse oximetry greater than 94% --Continue as needed bronchodilator therapy as well as inhaled  steroids    Nonischemic cardiomyopathy Last known LVEF is 20 to 25% from a 2D echocardiogram that was done 08/22 Patient is status post AICD insertion   Procedures: EGD colonoscopy Family communication : daughter [name] on 12/15 Consults : G.I. CODE STATUS: full DVT Prophylaxis : SCD Level of care: Med-Surg Status is: Inpatient  Remains inpatient appropriate because: low hgb        TOTAL TIME TAKING CARE OF THIS PATIENT: 25 minutes.  >50% time spent on counselling and coordination of care  Note: This dictation was prepared with Dragon dictation along with smaller phrase technology. Any transcriptional errors that result from this process are unintentional.  Fritzi Mandes M.D    [facility]   CC: Primary care physician; Jon Billings, NP Patient ID: Maria Neal, female   DOB: [birth year], 70 y.o.   MRN: [id number]

## 2020-12-18 LAB — TYPE AND SCREEN
ABO/RH(D): A POS
Antibody Screen: NEGATIVE
Unit division: 0
Unit division: 0

## 2020-12-18 LAB — BPAM RBC
Blood Product Expiration Date: 202301022359
Blood Product Expiration Date: 202301032359
ISSUE DATE / TIME: 202212131412
ISSUE DATE / TIME: 202212160915
Unit Type and Rh: 6200
Unit Type and Rh: 6200

## 2020-12-18 LAB — HEMOGLOBIN: Hemoglobin: 7.1 g/dL — ABNORMAL LOW (ref 12.0–15.0)

## 2020-12-18 NOTE — Discharge Summary (Signed)
[facility] at [place] NAME: Keymani Glynn    MR#:  [id number]  DATE OF BIRTH:  01/03/1950  DATE OF ADMISSION:  12/14/2020 ADMITTING PHYSICIAN: Fritzi Mandes, MD  DATE OF DISCHARGE: 12/18/2020  PRIMARY CARE PHYSICIAN: Jon Billings, NP    ADMISSION DIAGNOSIS:  Acute blood loss anemia [D62] Acute GI bleeding [K92.2] Acute blood loss anemia (ABLA) [D62] GI bleed [K92.2]  DISCHARGE DIAGNOSIS:  Acute on chronic slow G.I. bleed suspect diverticular IDA SECONDARY DIAGNOSIS:   Past Medical History:  Diagnosis Date   Acute appendicitis 02/17/2018   AICD (automatic cardioverter/defibrillator) present    Anxiety state    Arrhythmia    atrial fibrillation   CAD (coronary artery disease)    Cardiomyopathy ([place])    CHF (congestive heart failure) (HCC)    COPD (chronic obstructive pulmonary disease) (HCC)    Dyspnea    Headache    History of kidney stones    History of shingles    Hypertension    On home oxygen therapy    bedtime and prn   Restless leg syndrome     HOSPITAL COURSE:  CANDIACE WEST is a 70 y.o. 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   Acute slow GI  blood loss anemia iron deficiency anemia/low normal B12 --suspect From gastrointestinal source.  Patient has had maroon-colored stools --Patient has a baseline hemoglobin of 9.5g/dl and today on admission hemoglobin is 6.6g/dl--1 unit BT--7.7 --Continue IV Protonix --s/p EGD--Duodenal deformity.                        - Normal stomach.                        - Small hiatal hernia.                        - Normal gastroesophageal junction and esophagus.   --colonoscopy --poor prep --CT abd/pelvis per GI--stable duodenal diverticulum -- start patient on IV iron and oral iron supplement -- PO B 12 -- colonoscopy- One 9 mm polyp in the cecum, removed with a cold                         snare. Resected and retrieved.                        - One 5 mm polyp in the descending colon, removed with                         a cold snare. Resected and retrieved.                        - One 5 mm polyp in the ascending colon, removed with                         a cold snare. Resected and retrieved.                        -  Diverticulosis in the sigmoid colon. -- G.I. bleed presumed diverticular -- 12/16--hgb 5.8 today--repeat 6.4--1 unit BT today. Pt agreeable for BT --12/17--hgb 7.1 no active bleed per Rn. Pt overall feels ok D/w GI--will d/c home   Chronic atrial fibrillation --Continue digoxin, amiodarone and carvedilol for rate control --Hold apixaban due to anemia-- Dr. Allen Norris agrees with that. Patient will follow-up with G.I. and PCP/cardiology to determine if okay to start blood thinners down the road. This message was discussed with patient's daughter on the phone    Chronic systolic heart failure --Not acutely exacerbated --Continue carvedilol, spironolactone and Demadex --Patient not on an ACE inhibitor due to relative hypotension    COPD with chronic respiratory failure --Not acutely exacerbated -Continue 3 L of oxygen to maintain pulse oximetry greater than 94% --Continue as needed bronchodilator therapy as well as inhaled steroids    Nonischemic cardiomyopathy Last known LVEF is 20 to 25% from a 2D echocardiogram that was done 08/22 Patient is status post AICD insertion   Procedures: EGD colonoscopy Family communication : none today Consults : G.I. CODE STATUS: full DVT Prophylaxis : SCD Level of care: Med-Surg Status is: Inpatient   d/c home        CONSULTS OBTAINED:  Treatment Team:  Lucilla Lame, MD  DRUG  ALLERGIES:   Allergies  Allergen Reactions   Levaquin [Levofloxacin] Anaphylaxis   Amoxicillin Hives    Did it involve swelling of the face/tongue/throat, SOB, or low BP? No Did it involve sudden or severe rash/hives, skin peeling, or any reaction on the inside of your mouth or nose? No Did you need to seek medical attention at a hospital or doctor's office? No When did it last happen?      10+ years If all above answers are "NO", may proceed with cephalosporin use.   Nitrofuran Derivatives Other (See Comments)    Unknown   Penicillins Other (See Comments)    Thinks it made her itch a lot, but isn't sure. Did it involve swelling of the face/tongue/throat, SOB, or low BP? No Did it involve sudden or severe rash/hives, skin peeling, or any reaction on the inside of your mouth or nose? No Did you need to seek medical attention at a hospital or doctor's office? No When did it last happen?      10+ years If all above answers are "NO", may proceed with cephalosporin use.    Zithromax [Azithromycin] Other (See Comments)   Antihistamines, Chlorpheniramine-Type Rash    DISCHARGE MEDICATIONS:   Allergies as of 12/18/2020       Reactions   Levaquin [levofloxacin] Anaphylaxis   Amoxicillin Hives   Did it involve swelling of the face/tongue/throat, SOB, or low BP? No Did it involve sudden or severe rash/hives, skin peeling, or any reaction on the inside of your mouth or nose? No Did you need to seek medical attention at a hospital or doctor's office? No When did it last happen?      10+ years If all above answers are "NO", may proceed with cephalosporin use.   Nitrofuran Derivatives Other (See Comments)   Unknown   Penicillins Other (See Comments)   Thinks it made her itch a lot, but isn't sure. Did it involve swelling of the face/tongue/throat, SOB, or low BP? No Did it involve sudden or severe rash/hives, skin peeling, or any reaction on the inside of your mouth or nose? No Did you  need to seek medical attention at a hospital or doctor's office? No When  did it last happen?      10+ years If all above answers are "NO", may proceed with cephalosporin use.   Zithromax [azithromycin] Other (See Comments)   Antihistamines, Chlorpheniramine-type Rash        Medication List     STOP taking these medications    apixaban 5 MG Tabs tablet Commonly known as: ELIQUIS       TAKE these medications    albuterol 108 (90 Base) MCG/ACT inhaler Commonly known as: VENTOLIN HFA Inhale 2 puffs into the lungs every 6 (six) hours as needed for wheezing or shortness of breath.   alum & mag hydroxide-simeth 200-200-20 MG/5ML suspension Commonly known as: MAALOX/MYLANTA Take 30 mLs by mouth every 4 (four) hours as needed for indigestion or heartburn.   amiodarone 200 MG tablet Commonly known as: PACERONE Take 1 tablet (200 mg total) by mouth daily. What changed: how much to take   calcitRIOL 0.25 MCG capsule Commonly known as: ROCALTROL Take 0.25 mcg by mouth daily.   carvedilol 6.25 MG tablet Commonly known as: COREG Take 6.25 mg by mouth 2 (two) times daily.   cyanocobalamin 1000 MCG tablet Take 1 tablet (1,000 mcg total) by mouth daily.   Digoxin 62.5 MCG Tabs Take 0.0625 mg by mouth daily.   fluticasone 50 MCG/ACT nasal spray Commonly known as: FLONASE Place 2 sprays into both nostrils daily.   gabapentin 100 MG capsule Commonly known as: NEURONTIN Take 1 capsule (100 mg total) by mouth at bedtime.   iron polysaccharides 150 MG capsule Commonly known as: NIFEREX Take 1 capsule (150 mg total) by mouth daily.   levothyroxine 175 MCG tablet Commonly known as: SYNTHROID Take 1 tablet (175 mcg total) by mouth daily.   midodrine 5 MG tablet Commonly known as: PROAMATINE Take 1 tablet (5 mg total) by mouth 3 (three) times daily with meals.   nystatin cream Commonly known as: MYCOSTATIN Apply 1 application topically 2 (two) times daily.    OXYGEN Inhale 3 L into the lungs 3 (three) times daily as needed (shortness of breath or coughing).   pantoprazole 40 MG tablet Commonly known as: Protonix Take 1 tablet (40 mg total) by mouth daily.   spironolactone 25 MG tablet Commonly known as: ALDACTONE Take 12.5 mg by mouth daily.   torsemide 20 MG tablet Commonly known as: DEMADEX Take 20 mg by mouth daily.   Trelegy Ellipta 100-62.5-25 MCG/ACT Aepb Generic drug: Fluticasone-Umeclidin-Vilant Inhale 1 puff into the lungs daily.   triamcinolone 0.025 % ointment Commonly known as: KENALOG Apply 1 application topically 2 (two) times daily.        If you experience worsening of your admission symptoms, develop shortness of breath, life threatening emergency, suicidal or homicidal thoughts you must seek medical attention immediately by calling 911 or calling your MD immediately  if symptoms less severe.  You Must read complete instructions/literature along with all the possible adverse reactions/side effects for all the Medicines you take and that have been prescribed to you. Take any new Medicines after you have completely understood and accept all the possible adverse reactions/side effects.   Please note  You were cared for by a hospitalist during your hospital stay. If you have any questions about your discharge medications or the care you received while you were in the hospital after you are discharged, you can call the unit and asked to speak with the hospitalist on call if the hospitalist that took care of you is not available. Once you are  discharged, your primary care physician will handle any further medical issues. Please note that NO REFILLS for any discharge medications will be authorized once you are discharged, as it is imperative that you return to your primary care physician (or establish a relationship with a primary care physician if you do not have one) for your aftercare needs so that they can reassess your  need for medications and monitor your lab values. Today   SUBJECTIVE   No new complaints. Happy to go home.   VITAL SIGNS:  Blood pressure (!) 109/59, pulse 74, temperature 97.7 F (36.5 C), resp. rate 17, height 5\' 4"  (1.626 m), weight 68 kg, SpO2 100 %.  I/O:   Intake/Output Summary (Last 24 hours) at 12/18/2020 0900 Last data filed at 12/17/2020 1854 Gross per 24 hour  Intake 718.67 ml  Output --  Net 718.67 ml    PHYSICAL EXAMINATION:  GENERAL:  70 y.o.-year-old patient lying in the bed with no acute distress.  LUNGS: Normal breath sounds bilaterally, no wheezing, rales,rhonchi or crepitation. No use of accessory muscles of respiration.  CARDIOVASCULAR: S1, S2 normal. No murmurs, rubs, or gallops.  ABDOMEN: Soft, non-tender, non-distended. Bowel sounds present. No organomegaly or mass.  EXTREMITIES: No pedal edema, cyanosis, or clubbing.  NEUROLOGIC: non-focal PSYCHIATRIC:  patient is alert and oriented x 3.  SKIN: No obvious rash, lesion, or ulcer.   DATA REVIEW:   CBC  Recent Labs  Lab 12/14/20 1208 12/14/20 2007 12/17/20 0516 12/17/20 0805 12/18/20 0441  WBC 6.1  --   --   --   --   HGB 6.6*   < > 5.8*   < > 7.1*  HCT 22.3*   < > 19.5*  --   --   PLT 256  --   --   --   --    < > = values in this interval not displayed.    Chemistries  Recent Labs  Lab 12/14/20 1208 12/15/20 0721  NA 136 134*  K 4.0 4.3  CL 93* 93*  CO2 34* 36*  GLUCOSE 91 85  BUN 38* 31*  CREATININE 1.59* 1.54*  CALCIUM 8.6* 8.4*  AST 22  --   ALT 18  --   ALKPHOS 71  --   BILITOT 0.9  --     Microbiology Results   Recent Results (from the past 240 hour(s))  Resp Panel by RT-PCR (Flu A&B, Covid) Nasopharyngeal Swab     Status: None   Collection Time: 12/15/20  8:04 AM   Specimen: Nasopharyngeal Swab; Nasopharyngeal(NP) swabs in vial transport medium  Result Value Ref Range Status   SARS Coronavirus 2 by RT PCR NEGATIVE NEGATIVE Final    Comment: (NOTE) SARS-CoV-2  target nucleic acids are NOT DETECTED.  The SARS-CoV-2 RNA is generally detectable in upper respiratory specimens during the acute phase of infection. The lowest concentration of SARS-CoV-2 viral copies this assay can detect is 138 copies/mL. A negative result does not preclude SARS-Cov-2 infection and should not be used as the sole basis for treatment or other patient management decisions. A negative result may occur with  improper specimen collection/handling, submission of specimen other than nasopharyngeal swab, presence of viral mutation(s) within the areas targeted by this assay, and inadequate number of viral copies(<138 copies/mL). A negative result must be combined with clinical observations, patient history, and epidemiological information. The expected result is Negative.  Fact Sheet for Patients:  EntrepreneurPulse.com.au  Fact Sheet for Healthcare Providers:  IncredibleEmployment.be  This  test is no t yet approved or cleared by the [place] and  has been authorized for detection and/or diagnosis of SARS-CoV-2 by FDA under an Emergency Use Authorization (EUA). This EUA will remain  in effect (meaning this test can be used) for the duration of the COVID-19 declaration under Section 564(b)(1) of the Act, 21 U.S.C.section 360bbb-3(b)(1), unless the authorization is terminated  or revoked sooner.       Influenza A by PCR NEGATIVE NEGATIVE Final   Influenza B by PCR NEGATIVE NEGATIVE Final    Comment: (NOTE) The Xpert Xpress SARS-CoV-2/FLU/RSV plus assay is intended as an aid in the diagnosis of influenza from Nasopharyngeal swab specimens and should not be used as a sole basis for treatment. Nasal washings and aspirates are unacceptable for Xpert Xpress SARS-CoV-2/FLU/RSV testing.  Fact Sheet for Patients: EntrepreneurPulse.com.au  Fact Sheet for Healthcare  Providers: IncredibleEmployment.be  This test is not yet approved or cleared by the [place] FDA and has been authorized for detection and/or diagnosis of SARS-CoV-2 by FDA under an Emergency Use Authorization (EUA). This EUA will remain in effect (meaning this test can be used) for the duration of the COVID-19 declaration under Section 564(b)(1) of the Act, 21 U.S.C. section 360bbb-3(b)(1), unless the authorization is terminated or revoked.  Performed at [facility], [address], [place], [place] [address]     RADIOLOGY:  No results found.   CODE STATUS:     Code Status Orders  (From admission, onward)           Start     Ordered   12/14/20 1522  Full code  Continuous        12/14/20 1522           Code Status History     Date Active Date Inactive Code Status Order ID Comments User Context   11/04/2020 2000 11/07/2020 1703 Full Code [id number]  Kristopher Oppenheim, DO Inpatient   11/04/2020 1257 11/04/2020 2000 Full Code [id number]  Kristopher Oppenheim, DO ED   09/14/2020 0906 09/14/2020 1652 Full Code [id number]  Isaias Cowman, MD Inpatient   08/07/2020 1654 08/13/2020 2323 Full Code [id number]  Dixie Dials, MD ED   06/27/2018 1002 06/27/2018 1726 Full Code [id number]  Malachy Mood, MD Inpatient   02/17/2018 1956 02/18/2018 1619 Full Code [id number]  Benjamine Sprague, DO Inpatient   05/07/2014 2321 05/09/2014 1624 Full Code [id number]  Hower, Aaron Mose, MD ED      Advance Directive Documentation    Flowsheet Row Most Recent Value  Type of Advance Directive Healthcare Power of Attorney  Pre-existing out of facility DNR order (yellow form or pink MOST form) --  "MOST" Form in Place? --        TOTAL TIME TAKING CARE OF THIS PATIENT: 40 minutes.    Fritzi Mandes M.D  [facility]    CC: Primary care physician; Jon Billings, NP

## 2020-12-19 ENCOUNTER — Encounter: Payer: Self-pay | Admitting: Gastroenterology

## 2020-12-21 ENCOUNTER — Telehealth: Payer: Self-pay

## 2020-12-21 DIAGNOSIS — R0602 Shortness of breath: Secondary | ICD-10-CM | POA: Diagnosis not present

## 2020-12-21 DIAGNOSIS — Z95810 Presence of automatic (implantable) cardiac defibrillator: Secondary | ICD-10-CM | POA: Diagnosis not present

## 2020-12-21 DIAGNOSIS — I10 Essential (primary) hypertension: Secondary | ICD-10-CM | POA: Diagnosis not present

## 2020-12-21 DIAGNOSIS — K922 Gastrointestinal hemorrhage, unspecified: Secondary | ICD-10-CM | POA: Diagnosis not present

## 2020-12-21 DIAGNOSIS — I428 Other cardiomyopathies: Secondary | ICD-10-CM | POA: Diagnosis not present

## 2020-12-21 DIAGNOSIS — I5022 Chronic systolic (congestive) heart failure: Secondary | ICD-10-CM | POA: Diagnosis not present

## 2020-12-21 DIAGNOSIS — I872 Venous insufficiency (chronic) (peripheral): Secondary | ICD-10-CM | POA: Diagnosis not present

## 2020-12-21 NOTE — Chronic Care Management (AMB) (Signed)
°  Chronic Care Management   Outreach Note  12/21/2020 Name: Maria Neal MRN: [id number] DOB: 1950-06-10  Maria Neal is a 70 y.o. year old female who is a primary care patient of Jon Billings, NP. I reached out to [name] by phone today in response to a referral sent by [name]'s primary care provider.  An unsuccessful telephone outreach was attempted today. The patient was referred to the case management team for assistance with care management and care coordination.   Follow Up Plan: The care management team will reach out to the patient again over the next 7 days.  If patient returns call to provider office, please advise to call [place] [facility] [place], [place], [place], [place] [address] Direct Dial: [phone] [name]

## 2020-12-22 DIAGNOSIS — I428 Other cardiomyopathies: Secondary | ICD-10-CM | POA: Diagnosis not present

## 2020-12-22 DIAGNOSIS — E538 Deficiency of other specified B group vitamins: Secondary | ICD-10-CM | POA: Diagnosis not present

## 2020-12-22 DIAGNOSIS — D500 Iron deficiency anemia secondary to blood loss (chronic): Secondary | ICD-10-CM | POA: Diagnosis not present

## 2020-12-22 DIAGNOSIS — I5022 Chronic systolic (congestive) heart failure: Secondary | ICD-10-CM | POA: Diagnosis not present

## 2020-12-22 DIAGNOSIS — Z9181 History of falling: Secondary | ICD-10-CM | POA: Diagnosis not present

## 2020-12-22 DIAGNOSIS — I110 Hypertensive heart disease with heart failure: Secondary | ICD-10-CM | POA: Diagnosis not present

## 2020-12-22 DIAGNOSIS — I2510 Atherosclerotic heart disease of native coronary artery without angina pectoris: Secondary | ICD-10-CM | POA: Diagnosis not present

## 2020-12-22 DIAGNOSIS — E039 Hypothyroidism, unspecified: Secondary | ICD-10-CM | POA: Diagnosis not present

## 2020-12-22 DIAGNOSIS — K635 Polyp of colon: Secondary | ICD-10-CM | POA: Diagnosis not present

## 2020-12-22 DIAGNOSIS — J449 Chronic obstructive pulmonary disease, unspecified: Secondary | ICD-10-CM | POA: Diagnosis not present

## 2020-12-22 DIAGNOSIS — Z9981 Dependence on supplemental oxygen: Secondary | ICD-10-CM | POA: Diagnosis not present

## 2020-12-22 DIAGNOSIS — Z7982 Long term (current) use of aspirin: Secondary | ICD-10-CM | POA: Diagnosis not present

## 2020-12-22 DIAGNOSIS — I482 Chronic atrial fibrillation, unspecified: Secondary | ICD-10-CM | POA: Diagnosis not present

## 2020-12-22 DIAGNOSIS — G2581 Restless legs syndrome: Secondary | ICD-10-CM | POA: Diagnosis not present

## 2020-12-22 DIAGNOSIS — J9610 Chronic respiratory failure, unspecified whether with hypoxia or hypercapnia: Secondary | ICD-10-CM | POA: Diagnosis not present

## 2020-12-23 ENCOUNTER — Ambulatory Visit: Payer: Medicare Other | Admitting: Nurse Practitioner

## 2020-12-23 ENCOUNTER — Telehealth: Payer: Self-pay | Admitting: Nurse Practitioner

## 2020-12-23 DIAGNOSIS — I110 Hypertensive heart disease with heart failure: Secondary | ICD-10-CM | POA: Diagnosis not present

## 2020-12-23 DIAGNOSIS — I482 Chronic atrial fibrillation, unspecified: Secondary | ICD-10-CM | POA: Diagnosis not present

## 2020-12-23 DIAGNOSIS — D500 Iron deficiency anemia secondary to blood loss (chronic): Secondary | ICD-10-CM | POA: Diagnosis not present

## 2020-12-23 DIAGNOSIS — J9610 Chronic respiratory failure, unspecified whether with hypoxia or hypercapnia: Secondary | ICD-10-CM | POA: Diagnosis not present

## 2020-12-23 DIAGNOSIS — K635 Polyp of colon: Secondary | ICD-10-CM | POA: Diagnosis not present

## 2020-12-23 DIAGNOSIS — Z9181 History of falling: Secondary | ICD-10-CM | POA: Diagnosis not present

## 2020-12-23 DIAGNOSIS — G2581 Restless legs syndrome: Secondary | ICD-10-CM | POA: Diagnosis not present

## 2020-12-23 DIAGNOSIS — I5022 Chronic systolic (congestive) heart failure: Secondary | ICD-10-CM | POA: Diagnosis not present

## 2020-12-23 DIAGNOSIS — E039 Hypothyroidism, unspecified: Secondary | ICD-10-CM | POA: Diagnosis not present

## 2020-12-23 DIAGNOSIS — I2510 Atherosclerotic heart disease of native coronary artery without angina pectoris: Secondary | ICD-10-CM | POA: Diagnosis not present

## 2020-12-23 DIAGNOSIS — I428 Other cardiomyopathies: Secondary | ICD-10-CM | POA: Diagnosis not present

## 2020-12-23 DIAGNOSIS — E538 Deficiency of other specified B group vitamins: Secondary | ICD-10-CM | POA: Diagnosis not present

## 2020-12-23 DIAGNOSIS — Z9981 Dependence on supplemental oxygen: Secondary | ICD-10-CM | POA: Diagnosis not present

## 2020-12-23 DIAGNOSIS — J449 Chronic obstructive pulmonary disease, unspecified: Secondary | ICD-10-CM | POA: Diagnosis not present

## 2020-12-23 DIAGNOSIS — Z7982 Long term (current) use of aspirin: Secondary | ICD-10-CM | POA: Diagnosis not present

## 2020-12-23 NOTE — Telephone Encounter (Signed)
Spoke with Cecille Rubin and provided her with verbal OK for order per Jon Billings, NP. Lori verbalized understanding.

## 2020-12-23 NOTE — Telephone Encounter (Signed)
Copied from [place] [id number]. Topic: Quick Communication - Home Health Verbal Orders >> Dec 23, 2020  9:15 AM Jodie Echevaria wrote: Caller/Agency: [facility] Number: [phone] [place] [facility]  Requesting OT/PT/Skilled Nursing/Social Work/Speech Therapy: Skilled nursing  Frequency: 1 w 9 & 2 PRN

## 2020-12-23 NOTE — Telephone Encounter (Signed)
Copied from [place] [id number]. Topic: Quick Communication - Home Health Verbal Orders >> Dec 23, 2020  4:36 PM Yvette Rack wrote: Caller/Agency: Gerald Stabs with [place] [facility] Number: [phone] Requesting OT/PT/Skilled Nursing/Social Work/Speech Therapy: PT  Frequency: 1 x 5 weeks

## 2020-12-24 NOTE — Telephone Encounter (Signed)
I responded to this in an earlier message.  Okay to give verbal order.

## 2020-12-24 NOTE — Telephone Encounter (Signed)
Called and gave verbal orders per Karen.  

## 2020-12-28 ENCOUNTER — Ambulatory Visit: Payer: Medicare Other | Admitting: Nurse Practitioner

## 2020-12-28 NOTE — Progress Notes (Deleted)
There were no vitals taken for this visit.   Subjective:    Patient ID: Maria Neal, female    DOB: 05-12-1950, 70 y.o.   MRN: [id number]  HPI: Maria Neal is a 70 y.o. female  No chief complaint on file.  HYPOTHYROIDISM  COPD COPD status: controlled Satisfied with current treatment?: yes Oxygen use: yes- 3L Dyspnea frequency: yes with activity Cough frequency: no Rescue inhaler frequency:  no use Limitation of activity: yes Productive cough:  Last Spirometry:  Pneumovax: Up to Date Influenza: Up to Date  CHRONIC KIDNEY DISEASE CKD status: controlled Medications renally dose: yes Previous renal evaluation: no Pneumovax:  Up to Date Influenza Vaccine:  Up to Date  HYPERTENSION / HYPERLIPIDEMIA/HEART FAILURE Patient is seeing Cardiology. States goal is to get her off the Midodrine and back on her Carvedilol. Patient is not checking blood pressures at home. Does have SOB and is on home O2.  GI BLEED     Relevant past medical, surgical, family and social history reviewed and updated as indicated. Interim medical history since our last visit reviewed. Allergies and medications reviewed and updated.  Review of Systems  Eyes:  Negative for visual disturbance.  Respiratory:  Positive for shortness of breath. Negative for cough and chest tightness.   Cardiovascular:  Negative for chest pain, palpitations and leg swelling.  Skin:        Itching on bottom of feet.   Neurological:  Negative for dizziness and headaches.   Per HPI unless specifically indicated above     Objective:    There were no vitals taken for this visit.  Wt Readings from Last 3 Encounters:  12/16/20 149 lb 14.6 oz (68 kg)  12/13/20 148 lb 12.8 oz (67.5 kg)  12/10/20 150 lb (68 kg)    Physical Exam Vitals and nursing note reviewed.  Constitutional:      General: She is not in acute distress.    Appearance: Normal appearance. She is normal weight. She is not ill-appearing, toxic-appearing  or diaphoretic.  HENT:     Head: Normocephalic.     Right Ear: External ear normal.     Left Ear: External ear normal.     Nose: Nose normal.     Mouth/Throat:     Mouth: Mucous membranes are moist.     Pharynx: Oropharynx is clear.  Eyes:     General:        Right eye: No discharge.        Left eye: No discharge.     Extraocular Movements: Extraocular movements intact.     Conjunctiva/sclera: Conjunctivae normal.     Pupils: Pupils are equal, round, and reactive to light.  Cardiovascular:     Rate and Rhythm: Normal rate and regular rhythm.     Heart sounds: Murmur heard.  Pulmonary:     Effort: Pulmonary effort is normal. No respiratory distress.     Breath sounds: Normal breath sounds. No wheezing or rales.  Musculoskeletal:     Cervical back: Normal range of motion and neck supple.  Skin:    General: Skin is warm and dry.     Capillary Refill: Capillary refill takes less than 2 seconds.       Neurological:     General: No focal deficit present.     Mental Status: She is alert and oriented to person, place, and time. Mental status is at baseline.  Psychiatric:        Mood and Affect:  Mood normal.        Behavior: Behavior normal.        Thought Content: Thought content normal.        Judgment: Judgment normal.    Results for orders placed or performed during the hospital encounter of 12/14/20  Resp Panel by RT-PCR (Flu A&B, Covid) Nasopharyngeal Swab   Specimen: Nasopharyngeal Swab; Nasopharyngeal(NP) swabs in vial transport medium  Result Value Ref Range   SARS Coronavirus 2 by RT PCR NEGATIVE NEGATIVE   Influenza A by PCR NEGATIVE NEGATIVE   Influenza B by PCR NEGATIVE NEGATIVE  Comprehensive metabolic panel  Result Value Ref Range   Sodium 136 135 - 145 mmol/L   Potassium 4.0 3.5 - 5.1 mmol/L   Chloride 93 (L) 98 - 111 mmol/L   CO2 34 (H) 22 - 32 mmol/L   Glucose, Bld 91 70 - 99 mg/dL   BUN 38 (H) 8 - 23 mg/dL   Creatinine, Ser 1.59 (H) 0.44 - 1.00 mg/dL    Calcium 8.6 (L) 8.9 - 10.3 mg/dL   Total Protein 6.7 6.5 - 8.1 g/dL   Albumin 3.0 (L) 3.5 - 5.0 g/dL   AST 22 15 - 41 U/L   ALT 18 0 - 44 U/L   Alkaline Phosphatase 71 38 - 126 U/L   Total Bilirubin 0.9 0.3 - 1.2 mg/dL   GFR, Estimated 35 (L) >60 mL/min   Anion gap 9 5 - 15  CBC  Result Value Ref Range   WBC 6.1 4.0 - 10.5 K/uL   RBC 2.55 (L) 3.87 - 5.11 MIL/uL   Hemoglobin 6.6 (L) 12.0 - 15.0 g/dL   HCT 22.3 (L) 36.0 - 46.0 %   MCV 87.5 80.0 - 100.0 fL   MCH 25.9 (L) 26.0 - 34.0 pg   MCHC 29.6 (L) 30.0 - 36.0 g/dL   RDW 17.2 (H) 11.5 - 15.5 %   Platelets 256 150 - 400 K/uL   nRBC 0.0 0.0 - 0.2 %  Protime-INR - (order if Patient is taking Coumadin / Warfarin)  Result Value Ref Range   Prothrombin Time 21.4 (H) 11.4 - 15.2 seconds   INR 1.9 (H) 0.8 - 1.2  Digoxin level  Result Value Ref Range   Digoxin Level 1.3 0.8 - 2.0 ng/mL  Hemoglobin and hematocrit, blood  Result Value Ref Range   Hemoglobin 7.3 (L) 12.0 - 15.0 g/dL   HCT 24.0 (L) 36.0 - 98.3 %  Basic metabolic panel  Result Value Ref Range   Sodium 134 (L) 135 - 145 mmol/L   Potassium 4.3 3.5 - 5.1 mmol/L   Chloride 93 (L) 98 - 111 mmol/L   CO2 36 (H) 22 - 32 mmol/L   Glucose, Bld 85 70 - 99 mg/dL   BUN 31 (H) 8 - 23 mg/dL   Creatinine, Ser 1.54 (H) 0.44 - 1.00 mg/dL   Calcium 8.4 (L) 8.9 - 10.3 mg/dL   GFR, Estimated 36 (L) >60 mL/min   Anion gap 5 5 - 15  Hemoglobin and hematocrit, blood  Result Value Ref Range   Hemoglobin 7.3 (L) 12.0 - 15.0 g/dL   HCT 24.3 (L) 36.0 - 46.0 %  Hemoglobin and hematocrit, blood  Result Value Ref Range   Hemoglobin 7.9 (L) 12.0 - 15.0 g/dL   HCT 26.0 (L) 36.0 - 46.0 %  Ferritin  Result Value Ref Range   Ferritin 19 11 - 307 ng/mL  Iron and TIBC  Result Value Ref Range  Iron 17 (L) 28 - 170 ug/dL   TIBC 328 250 - 450 ug/dL   Saturation Ratios 5 (L) 10.4 - 31.8 %   UIBC 311 ug/dL  Vitamin B12  Result Value Ref Range   Vitamin B-12 210 180 - 914 pg/mL  Folate   Result Value Ref Range   Folate 10.9 >5.9 ng/mL  Hemoglobin and hematocrit, blood  Result Value Ref Range   Hemoglobin 5.8 (L) 12.0 - 15.0 g/dL   HCT 19.5 (L) 36.0 - 46.0 %  Hemoglobin  Result Value Ref Range   Hemoglobin 6.4 (L) 12.0 - 15.0 g/dL  Hemoglobin  Result Value Ref Range   Hemoglobin 7.1 (L) 12.0 - 15.0 g/dL  Hemoglobin  Result Value Ref Range   Hemoglobin 7.1 (L) 12.0 - 15.0 g/dL  Type and screen [facility]  Result Value Ref Range   ABO/RH(D) A POS    Antibody Screen NEG    Sample Expiration 12/17/2020,2359    Unit Number [id number]    Blood Component Type RED CELLS,LR    Unit division 00    Status of Unit ISSUED,FINAL    Transfusion Status OK TO TRANSFUSE    Crossmatch Result Compatible    Unit Number [id number]    Blood Component Type RED CELLS,LR    Unit division 00    Status of Unit ISSUED,FINAL    Transfusion Status OK TO TRANSFUSE    Crossmatch Result      Compatible Performed at [facility], [address], [place], [place] [address]   Prepare RBC (crossmatch)  Result Value Ref Range   Order Confirmation      ORDER PROCESSED BY BLOOD BANK Performed at [facility], [address] [place], [place] [address]   Prepare RBC (crossmatch)  Result Value Ref Range   Order Confirmation      ORDER PROCESSED BY BLOOD BANK Performed at [facility], [address], [place], [place] [address]   Prepare RBC (crossmatch)  Result Value Ref Range   Order Confirmation      DUPLICATE REQUEST Performed at [facility], [address], [place], [place] [address]   Surgical pathology  Result Value Ref Range   SURGICAL PATHOLOGY      SURGICAL PATHOLOGY CASE: [id number] PATIENT: Colin Ina Surgical Pathology Report     Specimen Submitted: A. Colon polyp, descending; cold snare B. Colon polyp, ascending; cold snare C. Colon polyp, cecum; cold snare  Clinical History: Melena, acute  blood loss anemia    DIAGNOSIS: A. COLON POLYP, DESCENDING; COLD SNARE: - TUBULAR ADENOMA. - NEGATIVE FOR HIGH-GRADE DYSPLASIA AND MALIGNANCY.  B. COLON POLYP, ASCENDING; COLD SNARE: - TUBULAR ADENOMA. - NEGATIVE FOR HIGH-GRADE DYSPLASIA AND MALIGNANCY.  C. COLON POLYP, CECUM; COLD SNARE: - TUBULOVILLOUS ADENOMA. - NEGATIVE FOR HIGH-GRADE DYSPLASIA AND MALIGNANCY.  GROSS DESCRIPTION: A. Labeled: Cold snare descending colon polyp Received: Formalin Collection time: 12:51 PM on 12/16/2020 Placed into formalin time: 12:51 PM on 12/16/2020 Tissue fragment(s): Multiple Size: Aggregate, 0.5 x 0.5 x 0.1 cm Description: Received are tan soft tissue fragments, admixed with intestinal debris.  The ratio of sof t tissue to intestinal debris is 80: 20. Entirely submitted in 1 cassette.  B. Labeled: Cold snare ascending colon polyp Received: Formalin Collection time: 12:53 PM on 12/16/2020 Placed into formalin time: 12:53 PM on 12/16/2020 Tissue fragment(s): Multiple Size: Aggregate, 0.6 x 0.3 x 0.1 cm Description: Received is a tan soft tissue fragment, admixed with intestinal debris.  The ratio of soft tissue to intestinal debris is 90: 10. Entirely submitted in 1 cassette.  C. Labeled: Cold snare cecum polyp Received: Formalin Collection time: 12:55 PM on 12/16/2020 Placed into formalin time: 12:55 PM on 12/16/2020 Tissue fragment(s): Multiple Size: Aggregate, 1.9 x 0.8 x 0.1 cm Description: Tan soft tissue fragments Entirely submitted in 1 cassette.  [facility] 12/16/2020  Final Diagnosis performed by Allena Napoleon, MD.   Electronically signed 12/17/2020 8:28:07AM The electronic signature indicates that the named Attending Pathologist has evaluated the specimen Technical  component performed at [facility], [address] [place], [place] [address] Lab: [phone] Dir: Rush Farmer, MD, MMM  Professional component performed at [facility], [facility], [place], [place],  [address] Lab: [phone] Dir: Kathi Simpers, MD   BPAM [facility]  Result Value Ref Range   ISSUE DATE / TIME [id number]    Blood Product Unit Number [id number]    PRODUCT CODE [id number]    Unit Type and Rh 6200    Blood Product Expiration Date 254982641583    ISSUE DATE / TIME [id number]    Blood Product Unit Number [id number]    PRODUCT CODE [id number]    Unit Type and Rh 6200    Blood Product Expiration Date [id number]       Assessment & Plan:   Problem List Items Addressed This Visit       Cardiovascular and Mediastinum   Acute on chronic systolic CHF (congestive heart failure) (HCC)     Respiratory   COPD, 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

## 2020-12-29 ENCOUNTER — Other Ambulatory Visit: Payer: Medicare Other

## 2020-12-31 DIAGNOSIS — I110 Hypertensive heart disease with heart failure: Secondary | ICD-10-CM | POA: Diagnosis not present

## 2020-12-31 DIAGNOSIS — Z9981 Dependence on supplemental oxygen: Secondary | ICD-10-CM | POA: Diagnosis not present

## 2020-12-31 DIAGNOSIS — I428 Other cardiomyopathies: Secondary | ICD-10-CM | POA: Diagnosis not present

## 2020-12-31 DIAGNOSIS — G2581 Restless legs syndrome: Secondary | ICD-10-CM | POA: Diagnosis not present

## 2020-12-31 DIAGNOSIS — Z9181 History of falling: Secondary | ICD-10-CM | POA: Diagnosis not present

## 2020-12-31 DIAGNOSIS — Z7982 Long term (current) use of aspirin: Secondary | ICD-10-CM | POA: Diagnosis not present

## 2020-12-31 DIAGNOSIS — J9610 Chronic respiratory failure, unspecified whether with hypoxia or hypercapnia: Secondary | ICD-10-CM | POA: Diagnosis not present

## 2020-12-31 DIAGNOSIS — I482 Chronic atrial fibrillation, unspecified: Secondary | ICD-10-CM | POA: Diagnosis not present

## 2020-12-31 DIAGNOSIS — E538 Deficiency of other specified B group vitamins: Secondary | ICD-10-CM | POA: Diagnosis not present

## 2020-12-31 DIAGNOSIS — D500 Iron deficiency anemia secondary to blood loss (chronic): Secondary | ICD-10-CM | POA: Diagnosis not present

## 2020-12-31 DIAGNOSIS — I5022 Chronic systolic (congestive) heart failure: Secondary | ICD-10-CM | POA: Diagnosis not present

## 2020-12-31 DIAGNOSIS — E039 Hypothyroidism, unspecified: Secondary | ICD-10-CM | POA: Diagnosis not present

## 2020-12-31 DIAGNOSIS — J449 Chronic obstructive pulmonary disease, unspecified: Secondary | ICD-10-CM | POA: Diagnosis not present

## 2020-12-31 DIAGNOSIS — I2510 Atherosclerotic heart disease of native coronary artery without angina pectoris: Secondary | ICD-10-CM | POA: Diagnosis not present

## 2020-12-31 DIAGNOSIS — K635 Polyp of colon: Secondary | ICD-10-CM | POA: Diagnosis not present

## 2021-01-04 DIAGNOSIS — I110 Hypertensive heart disease with heart failure: Secondary | ICD-10-CM | POA: Diagnosis not present

## 2021-01-04 DIAGNOSIS — G2581 Restless legs syndrome: Secondary | ICD-10-CM | POA: Diagnosis not present

## 2021-01-04 DIAGNOSIS — I5022 Chronic systolic (congestive) heart failure: Secondary | ICD-10-CM | POA: Diagnosis not present

## 2021-01-04 DIAGNOSIS — J449 Chronic obstructive pulmonary disease, unspecified: Secondary | ICD-10-CM | POA: Diagnosis not present

## 2021-01-04 DIAGNOSIS — E039 Hypothyroidism, unspecified: Secondary | ICD-10-CM | POA: Diagnosis not present

## 2021-01-04 DIAGNOSIS — Z9181 History of falling: Secondary | ICD-10-CM | POA: Diagnosis not present

## 2021-01-04 DIAGNOSIS — I482 Chronic atrial fibrillation, unspecified: Secondary | ICD-10-CM | POA: Diagnosis not present

## 2021-01-04 DIAGNOSIS — Z7982 Long term (current) use of aspirin: Secondary | ICD-10-CM | POA: Diagnosis not present

## 2021-01-04 DIAGNOSIS — Z9981 Dependence on supplemental oxygen: Secondary | ICD-10-CM | POA: Diagnosis not present

## 2021-01-04 DIAGNOSIS — K635 Polyp of colon: Secondary | ICD-10-CM | POA: Diagnosis not present

## 2021-01-04 DIAGNOSIS — J9610 Chronic respiratory failure, unspecified whether with hypoxia or hypercapnia: Secondary | ICD-10-CM | POA: Diagnosis not present

## 2021-01-04 DIAGNOSIS — I428 Other cardiomyopathies: Secondary | ICD-10-CM | POA: Diagnosis not present

## 2021-01-04 DIAGNOSIS — E538 Deficiency of other specified B group vitamins: Secondary | ICD-10-CM | POA: Diagnosis not present

## 2021-01-04 DIAGNOSIS — I2510 Atherosclerotic heart disease of native coronary artery without angina pectoris: Secondary | ICD-10-CM | POA: Diagnosis not present

## 2021-01-04 DIAGNOSIS — D500 Iron deficiency anemia secondary to blood loss (chronic): Secondary | ICD-10-CM | POA: Diagnosis not present

## 2021-01-05 DIAGNOSIS — E039 Hypothyroidism, unspecified: Secondary | ICD-10-CM | POA: Diagnosis not present

## 2021-01-05 DIAGNOSIS — G2581 Restless legs syndrome: Secondary | ICD-10-CM | POA: Diagnosis not present

## 2021-01-05 DIAGNOSIS — K635 Polyp of colon: Secondary | ICD-10-CM | POA: Diagnosis not present

## 2021-01-05 DIAGNOSIS — D500 Iron deficiency anemia secondary to blood loss (chronic): Secondary | ICD-10-CM | POA: Diagnosis not present

## 2021-01-05 DIAGNOSIS — Z9181 History of falling: Secondary | ICD-10-CM | POA: Diagnosis not present

## 2021-01-05 DIAGNOSIS — Z7982 Long term (current) use of aspirin: Secondary | ICD-10-CM | POA: Diagnosis not present

## 2021-01-05 DIAGNOSIS — I5022 Chronic systolic (congestive) heart failure: Secondary | ICD-10-CM | POA: Diagnosis not present

## 2021-01-05 DIAGNOSIS — E538 Deficiency of other specified B group vitamins: Secondary | ICD-10-CM | POA: Diagnosis not present

## 2021-01-05 DIAGNOSIS — I2510 Atherosclerotic heart disease of native coronary artery without angina pectoris: Secondary | ICD-10-CM | POA: Diagnosis not present

## 2021-01-05 DIAGNOSIS — J449 Chronic obstructive pulmonary disease, unspecified: Secondary | ICD-10-CM | POA: Diagnosis not present

## 2021-01-05 DIAGNOSIS — I110 Hypertensive heart disease with heart failure: Secondary | ICD-10-CM | POA: Diagnosis not present

## 2021-01-05 DIAGNOSIS — J9610 Chronic respiratory failure, unspecified whether with hypoxia or hypercapnia: Secondary | ICD-10-CM | POA: Diagnosis not present

## 2021-01-05 DIAGNOSIS — I428 Other cardiomyopathies: Secondary | ICD-10-CM | POA: Diagnosis not present

## 2021-01-05 DIAGNOSIS — I482 Chronic atrial fibrillation, unspecified: Secondary | ICD-10-CM | POA: Diagnosis not present

## 2021-01-05 DIAGNOSIS — Z9981 Dependence on supplemental oxygen: Secondary | ICD-10-CM | POA: Diagnosis not present

## 2021-01-06 ENCOUNTER — Other Ambulatory Visit: Payer: Self-pay

## 2021-01-06 ENCOUNTER — Encounter: Payer: Self-pay | Admitting: Family

## 2021-01-06 ENCOUNTER — Ambulatory Visit: Payer: Medicare Other | Attending: Family | Admitting: Family

## 2021-01-06 VITALS — BP 124/55 | HR 72 | Resp 16 | Ht 64.0 in | Wt 153.1 lb

## 2021-01-06 DIAGNOSIS — I4891 Unspecified atrial fibrillation: Secondary | ICD-10-CM | POA: Diagnosis not present

## 2021-01-06 DIAGNOSIS — Z9981 Dependence on supplemental oxygen: Secondary | ICD-10-CM | POA: Insufficient documentation

## 2021-01-06 DIAGNOSIS — J449 Chronic obstructive pulmonary disease, unspecified: Secondary | ICD-10-CM | POA: Diagnosis not present

## 2021-01-06 DIAGNOSIS — N189 Chronic kidney disease, unspecified: Secondary | ICD-10-CM | POA: Diagnosis not present

## 2021-01-06 DIAGNOSIS — R002 Palpitations: Secondary | ICD-10-CM | POA: Insufficient documentation

## 2021-01-06 DIAGNOSIS — I5022 Chronic systolic (congestive) heart failure: Secondary | ICD-10-CM

## 2021-01-06 DIAGNOSIS — Z95810 Presence of automatic (implantable) cardiac defibrillator: Secondary | ICD-10-CM | POA: Insufficient documentation

## 2021-01-06 DIAGNOSIS — I2510 Atherosclerotic heart disease of native coronary artery without angina pectoris: Secondary | ICD-10-CM | POA: Insufficient documentation

## 2021-01-06 DIAGNOSIS — Z79899 Other long term (current) drug therapy: Secondary | ICD-10-CM | POA: Diagnosis not present

## 2021-01-06 DIAGNOSIS — F419 Anxiety disorder, unspecified: Secondary | ICD-10-CM | POA: Insufficient documentation

## 2021-01-06 DIAGNOSIS — R0602 Shortness of breath: Secondary | ICD-10-CM | POA: Diagnosis not present

## 2021-01-06 DIAGNOSIS — Z7901 Long term (current) use of anticoagulants: Secondary | ICD-10-CM | POA: Insufficient documentation

## 2021-01-06 DIAGNOSIS — I130 Hypertensive heart and chronic kidney disease with heart failure and stage 1 through stage 4 chronic kidney disease, or unspecified chronic kidney disease: Secondary | ICD-10-CM | POA: Diagnosis not present

## 2021-01-06 DIAGNOSIS — I482 Chronic atrial fibrillation, unspecified: Secondary | ICD-10-CM

## 2021-01-06 DIAGNOSIS — Z87891 Personal history of nicotine dependence: Secondary | ICD-10-CM | POA: Insufficient documentation

## 2021-01-06 DIAGNOSIS — Z09 Encounter for follow-up examination after completed treatment for conditions other than malignant neoplasm: Secondary | ICD-10-CM | POA: Diagnosis not present

## 2021-01-06 DIAGNOSIS — Z7984 Long term (current) use of oral hypoglycemic drugs: Secondary | ICD-10-CM | POA: Diagnosis not present

## 2021-01-06 DIAGNOSIS — I10 Essential (primary) hypertension: Secondary | ICD-10-CM

## 2021-01-06 DIAGNOSIS — R42 Dizziness and giddiness: Secondary | ICD-10-CM | POA: Diagnosis not present

## 2021-01-06 DIAGNOSIS — G2581 Restless legs syndrome: Secondary | ICD-10-CM | POA: Insufficient documentation

## 2021-01-06 NOTE — Progress Notes (Signed)
Patient ID: MYRACLE FEBRES, female    DOB: 1950/11/28, 71 y.o.   MRN: [id number]  HPI  Ms Konen is a 71 y/o female with a history of atrial fibrillation, CAD, HTN, anxiety, COPD, shingles, restless leg syndrome, previous tobacco use and chronic heart failure.   Echo report from 08/10/20 reviewed and showed an EF of 20-25% along with severe LVH/ LAE, moderate MR, mild AR and Moderate (Grade III) atheroma plaque involving the aortic root and ascending aorta.  Admitted 12/14/20 due to GIB/anemia. GI consult obtained. Had endoscopy/ colonoscopy done with polyps noted. Blood transfusion complete. Given IV iron as well as oral iron/ B12. Apixaban held. Discharged after 4 days. Admitted 11/04/20 due to shortness of breath, fever, chills and fatigue. Noted to be hypoxic and placed on oxygen. Tested + for influenza A. Treated with tamiflu and initially given IV lasix with transition to oral diuretics. PT evaluation done. Discharged after 3 days.   She presents today for a follow-up visit with a chief complaint of minimal shortness of breath upon moderate exertion. She describes this as chronic in nature having been present for several years. She has associated fatigue, pedal edema, palpitations and light-headedness along with this. She denies any difficulty sleeping, abdominal distention, chest pain, cough or weight gain.   Drinking ~ 64-70 ounces of fluids daily.   Past Medical History:  Diagnosis Date   Acute appendicitis 02/17/2018   AICD (automatic cardioverter/defibrillator) present    Anxiety state    Arrhythmia    atrial fibrillation   CAD (coronary artery disease)    Cardiomyopathy (HCC)    CHF (congestive heart failure) (HCC)    COPD (chronic obstructive pulmonary disease) ([place])    Dyspnea    Headache    History of kidney stones    History of shingles    Hypertension    On home oxygen therapy    bedtime and prn   Restless leg syndrome    Past Surgical History:  Procedure Laterality Date    CARDIAC CATHETERIZATION  04/2014   Dr. Idelle Leech   CARDIAC CATHETERIZATION     CARDIAC DEFIBRILLATOR PLACEMENT     CARDIOVERSION N/A 08/11/2020   Procedure: CARDIOVERSION;  Surgeon: Dixie Dials, MD;  Location: [facility] ENDOSCOPY;  Service: Cardiovascular;  Laterality: N/A;   CHOLECYSTECTOMY N/A 08/23/2016   Procedure: LAPAROSCOPIC CHOLECYSTECTOMY;  Surgeon: Clayburn Pert, MD;  Location: [facility] ORS;  Service: General;  Laterality: N/A;   COLONOSCOPY WITH PROPOFOL N/A 12/15/2020   Procedure: COLONOSCOPY WITH PROPOFOL;  Surgeon: Lin Landsman, MD;  Location: [facility];  Service: Gastroenterology;  Laterality: N/A;   COLONOSCOPY WITH PROPOFOL N/A 12/16/2020   Procedure: COLONOSCOPY WITH PROPOFOL;  Surgeon: Lucilla Lame, MD;  Location: [facility] ENDOSCOPY;  Service: Endoscopy;  Laterality: N/A;   DILATATION & CURETTAGE/HYSTEROSCOPY WITH MYOSURE N/A 06/27/2018   Procedure: DILATATION & CURETTAGE/HYSTEROSCOPY WITH MYOSURE;  Surgeon: Malachy Mood, MD;  Location: [facility] ORS;  Service: Gynecology;  Laterality: N/A;   ESOPHAGOGASTRODUODENOSCOPY N/A 12/15/2020   Procedure: ESOPHAGOGASTRODUODENOSCOPY (EGD);  Surgeon: Lin Landsman, MD;  Location: [facility] ENDOSCOPY;  Service: Gastroenterology;  Laterality: N/A;   HERNIA REPAIR     LAPAROSCOPIC APPENDECTOMY N/A 02/17/2018   Procedure: APPENDECTOMY LAPAROSCOPIC;  Surgeon: Benjamine Sprague, DO;  Location: [facility] ORS;  Service: General;  Laterality: N/A;   PPM GENERATOR CHANGEOUT N/A 09/14/2020   Procedure: PPM GENERATOR CHANGEOUT;  Surgeon: Isaias Cowman, MD;  Location: [facility];  Service: Cardiovascular;  Laterality: N/A;   TEE WITHOUT CARDIOVERSION N/A  08/10/2020   Procedure: TRANSESOPHAGEAL ECHOCARDIOGRAM (TEE);  Surgeon: Dixie Dials, MD;  Location: [facility] ENDOSCOPY;  Service: Cardiovascular;  Laterality: N/A;   Family History  Problem Relation Age of Onset   Diabetes Mellitus II Mother    Hypertension Mother    Heart attack  Father    Arthritis Sister    Arthritis Sister    Thyroid disease Brother    Heart disease Brother    Diabetes Brother    Depression Daughter    Depression Son    Schizophrenia Son    Breast cancer Neg Hx    Social History   Tobacco Use   Smoking status: Former    Packs/day: 0.25    Types: Cigarettes   Smokeless tobacco: Never   Tobacco comments:    quit at age 32, whole pack lasted 3 weeks   Substance Use Topics   Alcohol use: No    Alcohol/week: 0.0 standard drinks   Allergies  Allergen Reactions   Levaquin [Levofloxacin] Anaphylaxis   Amoxicillin Hives    Did it involve swelling of the face/tongue/throat, SOB, or low BP? No Did it involve sudden or severe rash/hives, skin peeling, or any reaction on the inside of your mouth or nose? No Did you need to seek medical attention at a hospital or doctor's office? No When did it last happen?      10+ years If all above answers are "NO", may proceed with cephalosporin use.   Nitrofuran Derivatives Other (See Comments)    Unknown   Penicillins Other (See Comments)    Thinks it made her itch a lot, but isn't sure. Did it involve swelling of the face/tongue/throat, SOB, or low BP? No Did it involve sudden or severe rash/hives, skin peeling, or any reaction on the inside of your mouth or nose? No Did you need to seek medical attention at a hospital or doctor's office? No When did it last happen?      10+ years If all above answers are "NO", may proceed with cephalosporin use.    Zithromax [Azithromycin] Other (See Comments)   Antihistamines, Chlorpheniramine-Type Rash   Prior to Admission medications   Medication Sig Start Date End Date Taking? Authorizing Provider  albuterol (PROVENTIL HFA;VENTOLIN HFA) 108 (90 Base) MCG/ACT inhaler Inhale 2 puffs into the lungs every 6 (six) hours as needed for wheezing or shortness of breath.   Yes [provider]  amiodarone (PACERONE) 200 MG tablet Take 1 tablet (200 mg total) by  mouth daily. Patient taking differently: Take 100 mg by mouth daily. 08/13/20  Yes Dixie Dials, MD  calcitRIOL (ROCALTROL) 0.25 MCG capsule Take 0.25 mcg by mouth daily. 11/12/20  Yes [provider]  carvedilol (COREG) 6.25 MG tablet Take 6.25 mg by mouth 2 (two) times daily. 12/06/20  Yes [provider]  digoxin (LANOXIN) 0.125 MG tablet Take 125 mcg by mouth daily. 01/04/21  Yes [provider]  fluticasone (FLONASE) 50 MCG/ACT nasal spray Place 2 sprays into both nostrils daily. 07/26/18  Yes Volney American, PA-C  Fluticasone-Umeclidin-Vilant (TRELEGY ELLIPTA) 100-62.5-25 MCG/INH AEPB Inhale 1 puff into the lungs daily. 02/12/20  Yes [provider]  gabapentin (NEURONTIN) 100 MG capsule Take 1 capsule (100 mg total) by mouth at bedtime. 08/13/20  Yes Dixie Dials, MD  levothyroxine (SYNTHROID) 175 MCG tablet Take 1 tablet (175 mcg total) by mouth daily. 12/10/20  Yes Renato Shin, MD  midodrine (PROAMATINE) 5 MG tablet Take 1 tablet (5 mg total) by mouth 3 (  three) times daily with meals. 12/06/20  Yes Jon Billings, NP  nystatin cream (MYCOSTATIN) Apply 1 application topically 2 (two) times daily. 09/23/20  Yes Jon Billings, NP  OXYGEN Inhale 3 L into the lungs 3 (three) times daily as needed (shortness of breath or coughing).   Yes [provider]  spironolactone (ALDACTONE) 25 MG tablet Take 12.5 mg by mouth daily. 04/09/17  Yes [provider]  torsemide (DEMADEX) 20 MG tablet Take 20 mg by mouth daily.   Yes [provider]  vitamin B-12 1000 MCG tablet Take 1 tablet (1,000 mcg total) by mouth daily. 12/16/20  Yes Fritzi Mandes, MD  alum & mag hydroxide-simeth (MAALOX/MYLANTA) 200-200-20 MG/5ML suspension Take 30 mLs by mouth every 4 (four) hours as needed for indigestion or heartburn. Patient not taking: Reported on 01/06/2021 08/13/20   Dixie Dials, MD  digoxin 62.5 MCG TABS Take 0.0625 mg by mouth daily. Patient not  taking: Reported on 01/06/2021 08/14/20   Dixie Dials, MD  iron polysaccharides (NIFEREX) 150 MG capsule Take 1 capsule (150 mg total) by mouth daily. Patient not taking: Reported on 01/06/2021 12/16/20   Fritzi Mandes, MD  pantoprazole (PROTONIX) 40 MG tablet Take 1 tablet (40 mg total) by mouth daily. Patient not taking: Reported on 01/06/2021 12/16/20 12/16/21  Fritzi Mandes, MD  triamcinolone (KENALOG) 0.025 % ointment Apply 1 application topically 2 (two) times daily. Patient not taking: Reported on 01/06/2021 12/13/20   Jon Billings, NP    Review of Systems  Constitutional:  Positive for fatigue. Negative for appetite change.  HENT:  Negative for congestion, postnasal drip and sore throat.   Eyes: Negative.   Respiratory:  Positive for shortness of breath (minimal). Negative for cough and chest tightness.   Cardiovascular:  Positive for palpitations (sometimes) and leg swelling (around ankles). Negative for chest pain.  Gastrointestinal:  Negative for abdominal distention and abdominal pain.  Endocrine: Negative.   Genitourinary: Negative.   Musculoskeletal:  Negative for back pain and neck pain.  Skin: Negative.   Allergic/Immunologic: Negative.   Neurological:  Positive for light-headedness (sometimes). Negative for dizziness.  Hematological:  Negative for adenopathy. Does not bruise/bleed easily.  Psychiatric/Behavioral:  Negative for dysphoric mood and sleep disturbance (sleeping on 2 pillows). The patient is not nervous/anxious.    Vitals:   01/06/21 0953 01/06/21 1016  BP: (!) 124/55   Pulse: 72   Resp: 16   SpO2: 90% 100%  Weight: 153 lb 2 oz (69.5 kg)   Height: 5\' 4"  (1.626 m)    Wt Readings from Last 3 Encounters:  01/06/21 153 lb 2 oz (69.5 kg)  12/16/20 149 lb 14.6 oz (68 kg)  12/13/20 148 lb 12.8 oz (67.5 kg)   Lab Results  Component Value Date   CREATININE 1.54 (H) 12/15/2020   CREATININE 1.59 (H) 12/14/2020   CREATININE 1.67 (H) 12/13/2020   Physical  Exam Vitals and nursing note reviewed. Exam conducted with a chaperone present (daughter).  Constitutional:      Appearance: Normal appearance.  HENT:     Head: Normocephalic and atraumatic.  Cardiovascular:     Rate and Rhythm: Normal rate. Rhythm irregular.  Pulmonary:     Effort: Pulmonary effort is normal. No respiratory distress.     Breath sounds: No wheezing or rales.  Abdominal:     General: There is no distension.     Palpations: Abdomen is soft.  Musculoskeletal:        General: No tenderness.  Cervical back: Normal range of motion and neck supple.     Right lower leg: No tenderness. Edema (trace pitting) present.     Left lower leg: No tenderness. Edema (1+ pitting) present.     Comments: Large varicose vein left calf; nontender to palpation  Skin:    General: Skin is warm and dry.  Neurological:     General: No focal deficit present.     Mental Status: She is alert and oriented to person, place, and time.  Psychiatric:        Behavior: Behavior normal.        Thought Content: Thought content normal.   Assessment & Plan:  1: Chronic heart failure with reduced ejection fraction- - NYHA class II - euvolemic today - weighing daily; reminded to call for an overnight weight gain of > 2 pounds or a weekly weight gain of > 5 pounds - weight stable from last visit here 1 month ago - not adding salt to her food and tries to read food labels for sodium content - on GDMT of carvedilol and spironolactone - consider entresto/ jardiance if renal function continues to improve - has AICD present without discharge - BNP 11/07/20 was 2479.7  2: HTN with CKD- - BP looks good (124/55) - saw PCP Mathis Dad) 12/13/20 - saw nephrology Holley Raring) 10/25/20 - BMP 12/15/20 reviewed and showed sodium 134, potassium 4.3, creatinine 1.54 and GFR 36  3: Atrial fibrillation- - saw cardiology Margarito Courser) 12/21/20 - rate controlled on carvedilol and digoxin  4: COPD- - sees pulmonology  Raul Del) on 01/19/21 - wearing oxygen at 3L around the clock although arrived without it; initially sat on room air was 90%, placed on 3L and it quickly went up to 3L   Patient did not bring her medications nor a list. Each medication was verbally reviewed with the patient and she was encouraged to bring the bottles to every visit to confirm accuracy of list.   Return in 3 months or sooner for any questions/problems before then.

## 2021-01-06 NOTE — Patient Instructions (Signed)
Continue weighing daily and call for an overnight weight gain of 3 pounds or more or a weekly weight gain of more than 5 pounds.  °

## 2021-01-07 ENCOUNTER — Ambulatory Visit: Payer: Self-pay

## 2021-01-07 NOTE — Telephone Encounter (Signed)
Chief Complaint: Feet swelling, abdominal pain Symptoms: None Frequency: Swelling since Tuesday, abdominal pain x 2 weeks Pertinent Negatives: Patient denies SOB, CP, calf pain Disposition: [] ED /[] Urgent Care (no appt availability in office) / [x] Appointment(In office/virtual)/ []  [facility]/ [] Home Care/ [] Refused Recommended Disposition /[] [facility]/ []  Follow-up with PCP Additional Notes: N/A   Reason for Disposition  Swollen ankle joint  (Exception: area of localized swelling which is itchy)  [1] MODERATE pain (e.g., interferes with normal activities) AND [2] comes and goes (cramps) AND [3] present > 24 hours  (Exception: pain with Vomiting or Diarrhea - see that Guideline)  Answer Assessment - Initial Assessment Questions 1. LOCATION: "Which ankle is swollen?" "Where is the swelling?"     Left greater than right, feet as well 2. ONSET: "When did the swelling start?"     Tuesday 3. SIZE: "How large is the swelling?"     Able to wear shoes 4. PAIN: "Is there any pain?" If Yes, ask: "How bad is it?" (Scale 1-10; or mild, moderate, severe)   - NONE (0): no pain.   - MILD (1-3): doesn't interfere with normal activities.    - MODERATE (4-7): interferes with normal activities (e.g., work or school) or awakens from sleep, limping.    - SEVERE (8-10): excruciating pain, unable to do any normal activities, unable to walk.      No 5. CAUSE: "What do you think caused the ankle swelling?"     Chronic, comes and goes 6. OTHER SYMPTOMS: "Do you have any other symptoms?" (e.g., fever, chest pain, difficulty breathing, calf pain)     No 7. PREGNANCY: "Is there any chance you are pregnant?" "When was your last menstrual period?"     N/A  Answer Assessment - Initial Assessment Questions 1. LOCATION: "Where does it hurt?"      Right side 2. RADIATION: "Does the pain shoot anywhere else?" (e.g., chest, back)     No 3. ONSET: "When did the pain begin?" (e.g., minutes,  hours or days ago)      2 weeks ago 4. SUDDEN: "Gradual or sudden onset?"     *No Answer* 5. PATTERN "Does the pain come and go, or is it constant?"    - If constant: "Is it getting better, staying the same, or worsening?"      (Note: Constant means the pain never goes away completely; most serious pain is constant and it progresses)     - If intermittent: "How long does it last?" "Do you have pain now?"     (Note: Intermittent means the pain goes away completely between bouts)     *No Answer* 6. SEVERITY: "How bad is the pain?"  (e.g., Scale 1-10; mild, moderate, or severe)    - MILD (1-3): doesn't interfere with normal activities, abdomen soft and not tender to touch     - MODERATE (4-7): interferes with normal activities or awakens from sleep, abdomen tender to touch     - SEVERE (8-10): excruciating pain, doubled over, unable to do any normal activities       8 7. RECURRENT SYMPTOM: "Have you ever had this type of stomach pain before?" If Yes, ask: "When was the last time?" and "What happened that time?"      *No Answer* 8. AGGRAVATING FACTORS: "Does anything seem to cause this pain?" (e.g., foods, stress, alcohol)     *No Answer* 9. CARDIAC SYMPTOMS: "Do you have any of the following symptoms: chest pain, difficulty  breathing, sweating, nausea?"     *No Answer* 10. OTHER SYMPTOMS: "Do you have any other symptoms?" (e.g., back pain, diarrhea, fever, urination pain, vomiting)       *No Answer* 11. PREGNANCY: "Is there any chance you are pregnant?" "When was your last menstrual period?"       *No Answer*  Protocols used: Ankle Swelling-A-AH, Abdominal Pain - Upper-A-AH

## 2021-01-08 DIAGNOSIS — J449 Chronic obstructive pulmonary disease, unspecified: Secondary | ICD-10-CM | POA: Diagnosis not present

## 2021-01-08 DIAGNOSIS — J9690 Respiratory failure, unspecified, unspecified whether with hypoxia or hypercapnia: Secondary | ICD-10-CM | POA: Diagnosis not present

## 2021-01-10 ENCOUNTER — Encounter: Payer: Self-pay | Admitting: Internal Medicine

## 2021-01-10 ENCOUNTER — Ambulatory Visit: Payer: Medicare Other | Admitting: Internal Medicine

## 2021-01-10 ENCOUNTER — Emergency Department: Payer: Medicare Other

## 2021-01-10 ENCOUNTER — Emergency Department
Admission: EM | Admit: 2021-01-10 | Discharge: 2021-01-10 | Disposition: A | Discharge status: Home / Self Care | Payer: Medicare Other | Attending: Emergency Medicine | Admitting: Emergency Medicine

## 2021-01-10 ENCOUNTER — Other Ambulatory Visit: Payer: Self-pay

## 2021-01-10 ENCOUNTER — Other Ambulatory Visit: Payer: Self-pay | Admitting: Nurse Practitioner

## 2021-01-10 ENCOUNTER — Ambulatory Visit (INDEPENDENT_AMBULATORY_CARE_PROVIDER_SITE_OTHER): Payer: Medicare Other | Admitting: Internal Medicine

## 2021-01-10 ENCOUNTER — Encounter (INDEPENDENT_AMBULATORY_CARE_PROVIDER_SITE_OTHER): Payer: Self-pay

## 2021-01-10 ENCOUNTER — Encounter: Payer: Self-pay | Admitting: Emergency Medicine

## 2021-01-10 VITALS — BP 122/66 | HR 73 | Temp 98.1°F | Ht 66.14 in | Wt 157.4 lb

## 2021-01-10 DIAGNOSIS — Z743 Need for continuous supervision: Secondary | ICD-10-CM | POA: Diagnosis not present

## 2021-01-10 DIAGNOSIS — R109 Unspecified abdominal pain: Secondary | ICD-10-CM | POA: Diagnosis not present

## 2021-01-10 DIAGNOSIS — I517 Cardiomegaly: Secondary | ICD-10-CM | POA: Diagnosis not present

## 2021-01-10 DIAGNOSIS — I2510 Atherosclerotic heart disease of native coronary artery without angina pectoris: Secondary | ICD-10-CM | POA: Diagnosis not present

## 2021-01-10 DIAGNOSIS — I509 Heart failure, unspecified: Secondary | ICD-10-CM | POA: Insufficient documentation

## 2021-01-10 DIAGNOSIS — R0602 Shortness of breath: Secondary | ICD-10-CM | POA: Diagnosis not present

## 2021-01-10 DIAGNOSIS — R0902 Hypoxemia: Secondary | ICD-10-CM | POA: Diagnosis not present

## 2021-01-10 DIAGNOSIS — I4891 Unspecified atrial fibrillation: Secondary | ICD-10-CM | POA: Insufficient documentation

## 2021-01-10 DIAGNOSIS — R600 Localized edema: Secondary | ICD-10-CM | POA: Diagnosis not present

## 2021-01-10 DIAGNOSIS — J449 Chronic obstructive pulmonary disease, unspecified: Secondary | ICD-10-CM | POA: Diagnosis not present

## 2021-01-10 DIAGNOSIS — I130 Hypertensive heart and chronic kidney disease with heart failure and stage 1 through stage 4 chronic kidney disease, or unspecified chronic kidney disease: Secondary | ICD-10-CM | POA: Insufficient documentation

## 2021-01-10 DIAGNOSIS — N189 Chronic kidney disease, unspecified: Secondary | ICD-10-CM | POA: Insufficient documentation

## 2021-01-10 DIAGNOSIS — R1084 Generalized abdominal pain: Secondary | ICD-10-CM | POA: Insufficient documentation

## 2021-01-10 DIAGNOSIS — R1011 Right upper quadrant pain: Secondary | ICD-10-CM | POA: Diagnosis present

## 2021-01-10 DIAGNOSIS — I700 Atherosclerosis of aorta: Secondary | ICD-10-CM | POA: Diagnosis not present

## 2021-01-10 DIAGNOSIS — R6889 Other general symptoms and signs: Secondary | ICD-10-CM | POA: Diagnosis not present

## 2021-01-10 LAB — COMPREHENSIVE METABOLIC PANEL
ALT: 20 U/L (ref 0–44)
AST: 29 U/L (ref 15–41)
Albumin: 3.2 g/dL — ABNORMAL LOW (ref 3.5–5.0)
Alkaline Phosphatase: 95 U/L (ref 38–126)
Anion gap: 10 (ref 5–15)
BUN: 41 mg/dL — ABNORMAL HIGH (ref 8–23)
CO2: 30 mmol/L (ref 22–32)
Calcium: 8.7 mg/dL — ABNORMAL LOW (ref 8.9–10.3)
Chloride: 94 mmol/L — ABNORMAL LOW (ref 98–111)
Creatinine, Ser: 1.7 mg/dL — ABNORMAL HIGH (ref 0.44–1.00)
GFR, Estimated: 32 mL/min — ABNORMAL LOW (ref >60)
Glucose, Bld: 89 mg/dL (ref 70–99)
Potassium: 4.6 mmol/L (ref 3.5–5.1)
Sodium: 134 mmol/L — ABNORMAL LOW (ref 135–145)
Total Bilirubin: 1.1 mg/dL (ref 0.3–1.2)
Total Protein: 7 g/dL (ref 6.5–8.1)

## 2021-01-10 LAB — CBC WITH DIFFERENTIAL/PLATELET
Abs Immature Granulocytes: 0.03 10*3/uL (ref 0.00–0.07)
Basophils Absolute: 0 10*3/uL (ref 0.0–0.1)
Basophils Relative: 1 %
Eosinophils Absolute: 0.2 10*3/uL (ref 0.0–0.5)
Eosinophils Relative: 3 %
HCT: 29.7 % — ABNORMAL LOW (ref 36.0–46.0)
Hemoglobin: 8.7 g/dL — ABNORMAL LOW (ref 12.0–15.0)
Immature Granulocytes: 1 %
Lymphocytes Relative: 27 %
Lymphs Abs: 1.3 10*3/uL (ref 0.7–4.0)
MCH: 27 pg (ref 26.0–34.0)
MCHC: 29.3 g/dL — ABNORMAL LOW (ref 30.0–36.0)
MCV: 92.2 fL (ref 80.0–100.0)
Monocytes Absolute: 0.6 10*3/uL (ref 0.1–1.0)
Monocytes Relative: 12 %
Neutro Abs: 2.8 10*3/uL (ref 1.7–7.7)
Neutrophils Relative %: 56 %
Platelets: 180 10*3/uL (ref 150–400)
RBC: 3.22 MIL/uL — ABNORMAL LOW (ref 3.87–5.11)
RDW: 19.9 % — ABNORMAL HIGH (ref 11.5–15.5)
WBC: 4.9 10*3/uL (ref 4.0–10.5)
nRBC: 1.4 % — ABNORMAL HIGH (ref 0.0–0.2)

## 2021-01-10 LAB — LIPASE, BLOOD: Lipase: 39 U/L (ref 11–51)

## 2021-01-10 MED ORDER — LIDOCAINE VISCOUS HCL 2 % MT SOLN
15.0000 mL | Freq: Once | OROMUCOSAL | Status: AC
  Administered 2021-01-10: 15 mL via ORAL
  Filled 2021-01-10: qty 15

## 2021-01-10 MED ORDER — ALUM & MAG HYDROXIDE-SIMETH 200-200-20 MG/5ML PO SUSP
30.0000 mL | Freq: Once | ORAL | Status: AC
  Administered 2021-01-10: 30 mL via ORAL
  Filled 2021-01-10: qty 30

## 2021-01-10 MED ORDER — IOHEXOL 300 MG/ML  SOLN
75.0000 mL | Freq: Once | INTRAMUSCULAR | Status: AC | PRN
  Administered 2021-01-10: 75 mL via INTRAVENOUS
  Filled 2021-01-10: qty 75

## 2021-01-10 MED ORDER — SUCRALFATE 1 G PO TABS: 1.0000 g | ORAL_TABLET | Freq: Four times a day (QID) | ORAL | 0 refills | Status: DC

## 2021-01-10 NOTE — Telephone Encounter (Signed)
Requested Prescriptions  Pending Prescriptions Disp Refills   triamcinolone (KENALOG) 0.025 % ointment [Pharmacy Med Name: TRIAMCINOLONE ACETON 0.025% TOP OIN] 30 g 1    Sig: APPLY 1 APPLICATION TOPICALLY TWICE DAILY     Dermatology:  Corticosteroids Passed - 01/10/2021 10:06 AM      Passed - Valid encounter within last 12 months    Recent Outpatient Visits          Today Hypoxemia   [facility] Vigg, Avanti, MD   4 weeks ago Chronic systolic congestive heart failure, NYHA class 3 ([place])   [place], Karen, NP   2 months ago Hospital discharge follow-up   [facility] Jon Billings, NP   2 months ago COPD exacerbation [facility])   [facility] Jon Billings, NP   2 months ago Epigastric pain   [facility] Jon Billings, NP

## 2021-01-10 NOTE — ED Notes (Addendum)
Pt called from lobby by this RN. Found pt walking around w/c without [place] in nose. Pt lips noted to have blue/purple tint. O2 placed back on pt, pt encouraged to sit in w/c, taken to triage to obtain VS.  Pt 100% on 3L [place] in triage. States wears 3 L  at home. States had just walked to bathroom without O2 when taken to triage Pt denies [facility], denies complaints. States unsure why DR sent here.   Color of lips improving with O2 in place in triage. No distress noted, speaking in complete sentences.

## 2021-01-10 NOTE — ED Triage Notes (Addendum)
Pt comes via EMs from doctor office. Pt's O2 was 79% on 3L [place]. Pt was at 100% at 3L with EMs. Other vitals good. BP-113/57 HR-77 RR-20  Pt has hx of COPD. Pt denies any SOb or pain at this time.  Pt wears 3L [place] all the time.

## 2021-01-10 NOTE — ED Provider Notes (Signed)
CT ab/pel without acute findings. Patient did feel better after GI cocktail. Discussed possibility of gastritis/duodenitis with patient. Will discharge with dietary guidelines.    Nance Pear, MD 01/10/21 [phone]

## 2021-01-10 NOTE — Telephone Encounter (Signed)
Pt in ED. Pt had rx 4 weeks ago for 30 grams and 1 RF for triamcinolone cream and has requested more. Need to call pharmacy.

## 2021-01-10 NOTE — Progress Notes (Signed)
Ht 5' 6.14" (1.68 m)    Wt 157 lb 6.4 oz (71.4 kg)    BMI 25.30 kg/m    Subjective:    Patient ID: Maria Neal, female    DOB: 05/04/1950, 71 y.o.   MRN: [id number]  Chief Complaint  Patient presents with   Abdominal Pain    Right side pain for the past 3 weeks,   Itching Feet    B/L foot itching for over a week has tric Curel, and Triamcinolone for the itching.    HPI: Maria Neal is a 71 y.o. female  Heart Problem This is a chronic (pt has a ho CHF is at 79 % when she checked her in today and then 82% on arrival pt has a non functioning portable o2 tank has had recent incrase lower ext edema x last 2 dys recnerly hospitlaised) problem.   Chief Complaint  Patient presents with   Abdominal Pain    Right side pain for the past 3 weeks,   Itching Feet    B/L foot itching for over a week has tric Curel, and Triamcinolone for the itching.    Relevant past medical, surgical, family and social history reviewed and updated as indicated. Interim medical history since our last visit reviewed. Allergies and medications reviewed and updated.  Review of Systems  Per HPI unless specifically indicated above     Objective:    Ht 5' 6.14" (1.68 m)    Wt 157 lb 6.4 oz (71.4 kg)    BMI 25.30 kg/m   Wt Readings from Last 3 Encounters:  01/10/21 157 lb 6.4 oz (71.4 kg)  01/06/21 153 lb 2 oz (69.5 kg)  12/16/20 149 lb 14.6 oz (68 kg)    Physical Exam Vitals and nursing note reviewed.  Constitutional:      General: She is in acute distress.     Appearance: Normal appearance. She is ill-appearing. She is not diaphoretic.     Comments: Pt is mouth breathing and has purple lips and finger nails.   HENT:     Mouth/Throat:     Comments: Purple lips and purple - bluish discolored finger nails noted Eyes:     Conjunctiva/sclera: Conjunctivae normal.  Cardiovascular:     Rate and Rhythm: Rhythm irregular.     Heart sounds: Murmur heard.    No friction rub. No gallop.  Pulmonary:      Effort: Respiratory distress present.     Breath sounds: No stridor. No wheezing, rhonchi or rales.  Chest:     Chest wall: No tenderness.  Musculoskeletal:        General: Swelling present. No tenderness, deformity or signs of injury.     Right lower leg: Edema present.     Left lower leg: Edema present.     Comments: Bil pitting edema noted.   Skin:    General: Skin is warm and dry.     Coloration: Skin is not jaundiced.     Findings: No erythema.  Neurological:     Mental Status: She is alert.   Results for orders placed or performed during the hospital encounter of 12/14/20  Resp Panel by RT-PCR (Flu A&B, Covid) Nasopharyngeal Swab   Specimen: Nasopharyngeal Swab; Nasopharyngeal(NP) swabs in vial transport medium  Result Value Ref Range   SARS Coronavirus 2 by RT PCR NEGATIVE NEGATIVE   Influenza A by PCR NEGATIVE NEGATIVE   Influenza B by PCR NEGATIVE NEGATIVE  Comprehensive metabolic panel  Result  Value Ref Range   Sodium 136 135 - 145 mmol/L   Potassium 4.0 3.5 - 5.1 mmol/L   Chloride 93 (L) 98 - 111 mmol/L   CO2 34 (H) 22 - 32 mmol/L   Glucose, Bld 91 70 - 99 mg/dL   BUN 38 (H) 8 - 23 mg/dL   Creatinine, Ser 1.59 (H) 0.44 - 1.00 mg/dL   Calcium 8.6 (L) 8.9 - 10.3 mg/dL   Total Protein 6.7 6.5 - 8.1 g/dL   Albumin 3.0 (L) 3.5 - 5.0 g/dL   AST 22 15 - 41 U/L   ALT 18 0 - 44 U/L   Alkaline Phosphatase 71 38 - 126 U/L   Total Bilirubin 0.9 0.3 - 1.2 mg/dL   GFR, Estimated 35 (L) >60 mL/min   Anion gap 9 5 - 15  CBC  Result Value Ref Range   WBC 6.1 4.0 - 10.5 K/uL   RBC 2.55 (L) 3.87 - 5.11 MIL/uL   Hemoglobin 6.6 (L) 12.0 - 15.0 g/dL   HCT 22.3 (L) 36.0 - 46.0 %   MCV 87.5 80.0 - 100.0 fL   MCH 25.9 (L) 26.0 - 34.0 pg   MCHC 29.6 (L) 30.0 - 36.0 g/dL   RDW 17.2 (H) 11.5 - 15.5 %   Platelets 256 150 - 400 K/uL   nRBC 0.0 0.0 - 0.2 %  Protime-INR - (order if Patient is taking Coumadin / Warfarin)  Result Value Ref Range   Prothrombin Time 21.4 (H) 11.4 -  15.2 seconds   INR 1.9 (H) 0.8 - 1.2  Digoxin level  Result Value Ref Range   Digoxin Level 1.3 0.8 - 2.0 ng/mL  Hemoglobin and hematocrit, blood  Result Value Ref Range   Hemoglobin 7.3 (L) 12.0 - 15.0 g/dL   HCT 24.0 (L) 36.0 - 81.0 %  Basic metabolic panel  Result Value Ref Range   Sodium 134 (L) 135 - 145 mmol/L   Potassium 4.3 3.5 - 5.1 mmol/L   Chloride 93 (L) 98 - 111 mmol/L   CO2 36 (H) 22 - 32 mmol/L   Glucose, Bld 85 70 - 99 mg/dL   BUN 31 (H) 8 - 23 mg/dL   Creatinine, Ser 1.54 (H) 0.44 - 1.00 mg/dL   Calcium 8.4 (L) 8.9 - 10.3 mg/dL   GFR, Estimated 36 (L) >60 mL/min   Anion gap 5 5 - 15  Hemoglobin and hematocrit, blood  Result Value Ref Range   Hemoglobin 7.3 (L) 12.0 - 15.0 g/dL   HCT 24.3 (L) 36.0 - 46.0 %  Hemoglobin and hematocrit, blood  Result Value Ref Range   Hemoglobin 7.9 (L) 12.0 - 15.0 g/dL   HCT 26.0 (L) 36.0 - 46.0 %  Ferritin  Result Value Ref Range   Ferritin 19 11 - 307 ng/mL  Iron and TIBC  Result Value Ref Range   Iron 17 (L) 28 - 170 ug/dL   TIBC 328 250 - 450 ug/dL   Saturation Ratios 5 (L) 10.4 - 31.8 %   UIBC 311 ug/dL  Vitamin B12  Result Value Ref Range   Vitamin B-12 210 180 - 914 pg/mL  Folate  Result Value Ref Range   Folate 10.9 >5.9 ng/mL  Hemoglobin and hematocrit, blood  Result Value Ref Range   Hemoglobin 5.8 (L) 12.0 - 15.0 g/dL   HCT 19.5 (L) 36.0 - 46.0 %  Hemoglobin  Result Value Ref Range   Hemoglobin 6.4 (L) 12.0 - 15.0 g/dL  Hemoglobin  Result Value Ref Range   Hemoglobin 7.1 (L) 12.0 - 15.0 g/dL  Hemoglobin  Result Value Ref Range   Hemoglobin 7.1 (L) 12.0 - 15.0 g/dL  Type and screen [place]  Result Value Ref Range   ABO/RH(D) A POS    Antibody Screen NEG    Sample Expiration 12/17/2020,2359    Unit Number [id number]    Blood Component Type RED CELLS,LR    Unit division 00    Status of Unit ISSUED,FINAL    Transfusion Status OK TO TRANSFUSE    Crossmatch Result  Compatible    Unit Number [id number]    Blood Component Type RED CELLS,LR    Unit division 00    Status of Unit ISSUED,FINAL    Transfusion Status OK TO TRANSFUSE    Crossmatch Result      Compatible Performed at [facility], [address], [place], [place] [address]   Prepare RBC (crossmatch)  Result Value Ref Range   Order Confirmation      ORDER PROCESSED BY BLOOD BANK Performed at [facility], [address] [place], [place] [address]   Prepare RBC (crossmatch)  Result Value Ref Range   Order Confirmation      ORDER PROCESSED BY BLOOD BANK Performed at [facility], [address], [place], [place] [address]   Prepare RBC (crossmatch)  Result Value Ref Range   Order Confirmation      DUPLICATE REQUEST Performed at [facility], [address] [place], [place] [address]   Surgical pathology  Result Value Ref Range   SURGICAL PATHOLOGY      SURGICAL PATHOLOGY CASE: [id number] PATIENT: Colin Ina Surgical Pathology Report     Specimen Submitted: A. Colon polyp, descending; cold snare B. Colon polyp, ascending; cold snare C. Colon polyp, cecum; cold snare  Clinical History: Melena, acute blood loss anemia    DIAGNOSIS: A. COLON POLYP, DESCENDING; COLD SNARE: - TUBULAR ADENOMA. - NEGATIVE FOR HIGH-GRADE DYSPLASIA AND MALIGNANCY.  B. COLON POLYP, ASCENDING; COLD SNARE: - TUBULAR ADENOMA. - NEGATIVE FOR HIGH-GRADE DYSPLASIA AND MALIGNANCY.  C. COLON POLYP, CECUM; COLD SNARE: - TUBULOVILLOUS ADENOMA. - NEGATIVE FOR HIGH-GRADE DYSPLASIA AND MALIGNANCY.  GROSS DESCRIPTION: A. Labeled: Cold snare descending colon polyp Received: Formalin Collection time: 12:51 PM on 12/16/2020 Placed into formalin time: 12:51 PM on 12/16/2020 Tissue fragment(s): Multiple Size: Aggregate, 0.5 x 0.5 x 0.1 cm Description: Received are tan soft tissue fragments, admixed with intestinal debris.  The ratio of sof t tissue to  intestinal debris is 80: 20. Entirely submitted in 1 cassette.  B. Labeled: Cold snare ascending colon polyp Received: Formalin Collection time: 12:53 PM on 12/16/2020 Placed into formalin time: 12:53 PM on 12/16/2020 Tissue fragment(s): Multiple Size: Aggregate, 0.6 x 0.3 x 0.1 cm Description: Received is a tan soft tissue fragment, admixed with intestinal debris.  The ratio of soft tissue to intestinal debris is 90: 10. Entirely submitted in 1 cassette.  C. Labeled: Cold snare cecum polyp Received: Formalin Collection time: 12:55 PM on 12/16/2020 Placed into formalin time: 12:55 PM on 12/16/2020 Tissue fragment(s): Multiple Size: Aggregate, 1.9 x 0.8 x 0.1 cm Description: Tan soft tissue fragments Entirely submitted in 1 cassette.  [facility] 12/16/2020  Final Diagnosis performed by Allena Napoleon, MD.   Electronically signed 12/17/2020 8:28:07AM The electronic signature indicates that the named Attending Pathologist has evaluated the specimen Technical  component performed at [facility], [address] [place], [place] [address] Lab: [phone] Dir: Rush Farmer,  MD, MMM  Professional component performed at [facility], [facility], [place], [place], [place] [address] Lab: [phone] Dir: Kathi Simpers, MD   BP[facility]  Result Value Ref Range   ISSUE DATE / TIME [id number]    Blood Product Unit Number [id number]    PRODUCT CODE E0382V00    Unit Type and Rh 6200    Blood Product Expiration Date 202301022359    ISSUE DATE / TIME [id number]    Blood Product Unit Number [id number]    PRODUCT CODE D4081K48    Unit Type and Rh 6200    Blood Product Expiration Date 185631497026         Current Outpatient Medications:    albuterol (PROVENTIL HFA;VENTOLIN HFA) 108 (90 Base) MCG/ACT inhaler, Inhale 2 puffs into the lungs every 6 (six) hours as needed for wheezing or shortness of breath., Disp: , Rfl:    alum & mag hydroxide-simeth (MAALOX/MYLANTA)  200-200-20 MG/5ML suspension, Take 30 mLs by mouth every 4 (four) hours as needed for indigestion or heartburn., Disp: 355 mL, Rfl: 0   amiodarone (PACERONE) 200 MG tablet, Take 1 tablet (200 mg total) by mouth daily. (Patient taking differently: Take 100 mg by mouth daily.), Disp: 30 tablet, Rfl: 3   calcitRIOL (ROCALTROL) 0.25 MCG capsule, Take 0.25 mcg by mouth daily., Disp: , Rfl:    carvedilol (COREG) 6.25 MG tablet, Take 6.25 mg by mouth 2 (two) times daily., Disp: , Rfl:    digoxin (LANOXIN) 0.125 MG tablet, Take 125 mcg by mouth daily., Disp: , Rfl:    fluticasone (FLONASE) 50 MCG/ACT nasal spray, Place 2 sprays into both nostrils daily., Disp: 16 g, Rfl: 6   Fluticasone-Umeclidin-Vilant (TRELEGY ELLIPTA) 100-62.5-25 MCG/INH AEPB, Inhale 1 puff into the lungs daily., Disp: , Rfl:    gabapentin (NEURONTIN) 100 MG capsule, Take 1 capsule (100 mg total) by mouth at bedtime., Disp: 30 capsule, Rfl: 3   iron polysaccharides (NIFEREX) 150 MG capsule, Take 1 capsule (150 mg total) by mouth daily., Disp: 30 capsule, Rfl: 0   levothyroxine (SYNTHROID) 175 MCG tablet, Take 1 tablet (175 mcg total) by mouth daily., Disp: 90 tablet, Rfl: 3   midodrine (PROAMATINE) 5 MG tablet, Take 1 tablet (5 mg total) by mouth 3 (three) times daily with meals., Disp: 90 tablet, Rfl: 0   nystatin cream (MYCOSTATIN), Apply 1 application topically 2 (two) times daily., Disp: 30 g, Rfl: 1   OXYGEN, Inhale 3 L into the lungs 3 (three) times daily as needed (shortness of breath or coughing)., Disp: , Rfl:    pantoprazole (PROTONIX) 40 MG tablet, Take 1 tablet (40 mg total) by mouth daily., Disp: 30 tablet, Rfl: 1   spironolactone (ALDACTONE) 25 MG tablet, Take 12.5 mg by mouth daily., Disp: , Rfl:    torsemide (DEMADEX) 20 MG tablet, Take 20 mg by mouth daily., Disp: , Rfl:    triamcinolone (KENALOG) 0.025 % ointment, Apply 1 application topically 2 (two) times daily., Disp: 30 g, Rfl: 1   vitamin B-12 1000 MCG tablet, Take  1 tablet (1,000 mcg total) by mouth daily., Disp: 30 tablet, Rfl: 2   digoxin 62.5 MCG TABS, Take 0.0625 mg by mouth daily. (Patient not taking: Reported on 01/06/2021), Disp: 30 tablet, Rfl: 2    Assessment & Plan:  Hypoxia O2 sats at 79 % on arrival - recheck on 4 lts was 82 % Pt will be sent to the ER via EMS She drove herself to the office  with her daughter who cannot drive as she has a # foot injury Pt was transferred to O2 tank and at 3 lts was sat;ing at 88 - 90 % Had a low hb last visit to the Er @ 5.8 was recently transfused @ Er for  assumed diverticular bleeding Will be sent to the ER now.  ? Has recurrnet Gi bleed today / CHF exacerbation given lower ext edema

## 2021-01-10 NOTE — Discharge Instructions (Signed)
Please seek medical attention for any high fevers, chest pain, shortness of breath, change in behavior, persistent vomiting, bloody stool or any other new or concerning symptoms.  

## 2021-01-10 NOTE — ED Provider Notes (Signed)
[facility] Provider Note    Event Date/Time   First MD Initiated Contact with Patient 01/10/21 1358     (approximate)   History   Chief Complaint Abdominal Pain  HPI  Maria Neal is a 71 y.o. female with past medical history of hypertension, COPD on 3 L, CAD, CHF, chronic atrial fibrillation, and CKD who presents to the ED complaining of abdominal pain.  Patient reports that she has been having 3 days of pain primarily in the right upper quadrant of her abdomen which radiates from there and affects her entire abdomen.  She describes the pain as sharp and worse with certain positions or movements.  She has not had any nausea or vomiting and states eating does not exacerbate the pain.  She reports similar episodes of pain in the past, but believes she still has her gallbladder and denies any history of gallstones.  She has not taken anything for her symptoms at home.  She denies any changes in her bowel movements and has not had any dysuria, hematuria, or flank pain.  She reports she went to her PCP earlier today for a "checkup," where they were concerned that her oxygen levels were low and EMS was called.  Patient states that she has not had any more difficulty breathing than usual, denies any fever, cough, or chest pain.     Physical Exam   Triage Vital Signs: ED Triage Vitals  Enc Vitals Group     BP 01/10/21 1310 (!) 117/58     Pulse Rate 01/10/21 1310 75     Resp 01/10/21 1310 18     Temp 01/10/21 1310 98 F (36.7 C)     Temp src --      SpO2 01/10/21 1310 99 %     Weight 01/10/21 1402 157 lb 3 oz (71.3 kg)     Height 01/10/21 1402 5\' 6"  (1.676 m)     Head Circumference --      Peak Flow --      Pain Score 01/10/21 1157 0     Pain Loc --      Pain Edu? --      Excl. in [place]? --     Most recent vital signs: Vitals:   01/10/21 1310 01/10/21 1310  BP:  (!) 117/58  Pulse: 75   Resp: 18   Temp: 98 F (36.7 C)   SpO2: 99%     Constitutional:  Alert and oriented. Eyes: Conjunctivae are normal. Head: Atraumatic. Nose: No congestion/rhinnorhea. Mouth/Throat: Mucous membranes are moist.  Cardiovascular: Normal rate, regular rhythm. Grossly normal heart sounds.  2+ radial pulses bilaterally. Respiratory: Normal respiratory effort.  No retractions. Lungs CTAB. Gastrointestinal: Soft and diffusely tender to palpation, greatest in the right upper quadrant.  No rebound or guarding noted. No distention. Genitourinary: Deferred. Musculoskeletal: No lower extremity tenderness nor edema.  Neurologic:  Normal speech and language. No gross focal neurologic deficits are appreciated.    ED Results / Procedures / Treatments   Labs (all labs ordered are listed, but only abnormal results are displayed) Labs Reviewed  CBC WITH DIFFERENTIAL/PLATELET  COMPREHENSIVE METABOLIC PANEL  LIPASE, BLOOD  URINALYSIS, ROUTINE W REFLEX MICROSCOPIC     RADIOLOGY Chest x-ray reviewed by me with chronic interstitial opacities, no focal infiltrate, edema, or effusion noted.  PROCEDURES:  Critical Care performed: No  Procedures   MEDICATIONS ORDERED IN ED: Medications - No data to display   IMPRESSION / MDM / ASSESSMENT  AND PLAN / ED COURSE  I reviewed the triage vital signs and the nursing notes.                              71 y.o. female with past medical history of hypertension, COPD on 3 L, CAD, CHF, atrial fibrillation, and chronic kidney disease who presents to the ED complaining of 3 days of increasing pain in her abdomen primarily in the right upper quadrant but also affecting her abdomen diffusely.  Differential diagnosis includes, but is not limited to, cholecystitis, biliary colic, pancreatitis, ACS, gastritis.  Patient is well-appearing and in no acute distress, although there was concern for hypoxia at her PCPs office, she is currently maintaining O2 sats at 92% on her usual 3 L nasal cannula.  She denies any respiratory  complaints or chest pain at this time, chest x-ray reviewed by me and shows interstitial opacities that are chronic, no acute process noted.  Her primary complaint is abdominal pain and she is diffusely tender, greatest in the right upper quadrant.  We will further assess with CT scan, check labs including LFTs and lipase.  Patient currently declines any pain or nausea medication.  Patient turned over to oncoming provider pending CT, labs, and reassessment.       FINAL CLINICAL IMPRESSION(S) / ED DIAGNOSES   Final diagnoses:  Generalized abdominal pain     Rx / DC Orders   ED Discharge Orders     None        Note:  This document was prepared using Dragon voice recognition software and may include unintentional dictation errors.   Blake Divine, MD 01/10/21 [phone]

## 2021-01-12 ENCOUNTER — Ambulatory Visit: Payer: Self-pay

## 2021-01-12 DIAGNOSIS — Z9181 History of falling: Secondary | ICD-10-CM | POA: Diagnosis not present

## 2021-01-12 DIAGNOSIS — E039 Hypothyroidism, unspecified: Secondary | ICD-10-CM | POA: Diagnosis not present

## 2021-01-12 DIAGNOSIS — Z7982 Long term (current) use of aspirin: Secondary | ICD-10-CM | POA: Diagnosis not present

## 2021-01-12 DIAGNOSIS — I110 Hypertensive heart disease with heart failure: Secondary | ICD-10-CM | POA: Diagnosis not present

## 2021-01-12 DIAGNOSIS — I5022 Chronic systolic (congestive) heart failure: Secondary | ICD-10-CM | POA: Diagnosis not present

## 2021-01-12 DIAGNOSIS — Z9981 Dependence on supplemental oxygen: Secondary | ICD-10-CM | POA: Diagnosis not present

## 2021-01-12 DIAGNOSIS — D500 Iron deficiency anemia secondary to blood loss (chronic): Secondary | ICD-10-CM | POA: Diagnosis not present

## 2021-01-12 DIAGNOSIS — I428 Other cardiomyopathies: Secondary | ICD-10-CM | POA: Diagnosis not present

## 2021-01-12 DIAGNOSIS — I2510 Atherosclerotic heart disease of native coronary artery without angina pectoris: Secondary | ICD-10-CM | POA: Diagnosis not present

## 2021-01-12 DIAGNOSIS — J9610 Chronic respiratory failure, unspecified whether with hypoxia or hypercapnia: Secondary | ICD-10-CM | POA: Diagnosis not present

## 2021-01-12 DIAGNOSIS — G2581 Restless legs syndrome: Secondary | ICD-10-CM | POA: Diagnosis not present

## 2021-01-12 DIAGNOSIS — E538 Deficiency of other specified B group vitamins: Secondary | ICD-10-CM | POA: Diagnosis not present

## 2021-01-12 DIAGNOSIS — K635 Polyp of colon: Secondary | ICD-10-CM | POA: Diagnosis not present

## 2021-01-12 DIAGNOSIS — I482 Chronic atrial fibrillation, unspecified: Secondary | ICD-10-CM | POA: Diagnosis not present

## 2021-01-12 DIAGNOSIS — J449 Chronic obstructive pulmonary disease, unspecified: Secondary | ICD-10-CM | POA: Diagnosis not present

## 2021-01-12 NOTE — Telephone Encounter (Signed)
° °  Chief Complaint: Swelling to left foot/ankle Symptoms: Red rash is present Frequency: Started 2 weeks ago Pertinent Negatives: Patient denies pain Disposition: [] ED /[] Urgent Care (no appt availability in office) / [x] Appointment(In office/virtual)/ []  [facility]/ [] Home Care/ [] Refused Recommended Disposition /[] [facility]/ []  Follow-up with PCP Additional Notes:    Reason for Disposition  [1] MILD swelling of both ankles (i.e., pedal edema) AND [2] new-onset or worsening  Answer Assessment - Initial Assessment Questions 1. ONSET: "When did the swelling start?" (e.g., minutes, hours, days)     2 weeks ago 2. LOCATION: "What part of the leg is swollen?"  "Are both legs swollen or just one leg?"     Left foot and ankle 3. SEVERITY: "How bad is the swelling?" (e.g., localized; mild, moderate, severe)  - Localized - small area of swelling localized to one leg  - MILD pedal edema - swelling limited to foot and ankle, pitting edema < 1/4 inch (6 mm) deep, rest and elevation eliminate most or all swelling  - MODERATE edema - swelling of lower leg to knee, pitting edema > 1/4 inch (6 mm) deep, rest and elevation only partially reduce swelling  - SEVERE edema - swelling extends above knee, facial or hand swelling present      Mild 4. REDNESS: "Does the swelling look red or infected?"     Yes - rash present 5. PAIN: "Is the swelling painful to touch?" If Yes, ask: "How painful is it?"   (Scale 1-10; mild, moderate or severe)     No 6. FEVER: "Do you have a fever?" If Yes, ask: "What is it, how was it measured, and when did it start?"      No 7. CAUSE: "What do you think is causing the leg swelling?"     Unsure 8. MEDICAL HISTORY: "Do you have a history of heart failure, kidney disease, liver failure, or cancer?"     No 9. RECURRENT SYMPTOM: "Have you had leg swelling before?" If Yes, ask: "When was the last time?" "What happened that time?"     Yes 10. OTHER  SYMPTOMS: "Do you have any other symptoms?" (e.g., chest pain, difficulty breathing)       No 11. PREGNANCY: "Is there any chance you are pregnant?" "When was your last menstrual period?"       No  Protocols used: Leg Swelling and Edema-A-AH

## 2021-01-13 ENCOUNTER — Inpatient Hospital Stay: Admission: RE | Admit: 2021-01-13 | Payer: Medicare Other | Source: Ambulatory Visit

## 2021-01-13 ENCOUNTER — Other Ambulatory Visit: Payer: Medicare Other

## 2021-01-14 ENCOUNTER — Ambulatory Visit: Payer: Medicare Other | Admitting: Internal Medicine

## 2021-01-18 ENCOUNTER — Encounter: Payer: Self-pay | Admitting: Nurse Practitioner

## 2021-01-18 ENCOUNTER — Ambulatory Visit (INDEPENDENT_AMBULATORY_CARE_PROVIDER_SITE_OTHER): Payer: Medicare Other | Admitting: Nurse Practitioner

## 2021-01-18 ENCOUNTER — Other Ambulatory Visit: Payer: Self-pay

## 2021-01-18 VITALS — BP 115/64 | HR 74 | Wt 161.0 lb

## 2021-01-18 DIAGNOSIS — G2581 Restless legs syndrome: Secondary | ICD-10-CM | POA: Diagnosis not present

## 2021-01-18 DIAGNOSIS — Z9981 Dependence on supplemental oxygen: Secondary | ICD-10-CM | POA: Diagnosis not present

## 2021-01-18 DIAGNOSIS — I2510 Atherosclerotic heart disease of native coronary artery without angina pectoris: Secondary | ICD-10-CM | POA: Diagnosis not present

## 2021-01-18 DIAGNOSIS — I482 Chronic atrial fibrillation, unspecified: Secondary | ICD-10-CM | POA: Diagnosis not present

## 2021-01-18 DIAGNOSIS — K922 Gastrointestinal hemorrhage, unspecified: Secondary | ICD-10-CM | POA: Diagnosis not present

## 2021-01-18 DIAGNOSIS — I428 Other cardiomyopathies: Secondary | ICD-10-CM | POA: Diagnosis not present

## 2021-01-18 DIAGNOSIS — I5022 Chronic systolic (congestive) heart failure: Secondary | ICD-10-CM | POA: Diagnosis not present

## 2021-01-18 DIAGNOSIS — J449 Chronic obstructive pulmonary disease, unspecified: Secondary | ICD-10-CM | POA: Diagnosis not present

## 2021-01-18 DIAGNOSIS — E039 Hypothyroidism, unspecified: Secondary | ICD-10-CM | POA: Diagnosis not present

## 2021-01-18 DIAGNOSIS — J9610 Chronic respiratory failure, unspecified whether with hypoxia or hypercapnia: Secondary | ICD-10-CM | POA: Diagnosis not present

## 2021-01-18 DIAGNOSIS — E538 Deficiency of other specified B group vitamins: Secondary | ICD-10-CM | POA: Diagnosis not present

## 2021-01-18 DIAGNOSIS — I110 Hypertensive heart disease with heart failure: Secondary | ICD-10-CM | POA: Diagnosis not present

## 2021-01-18 DIAGNOSIS — D500 Iron deficiency anemia secondary to blood loss (chronic): Secondary | ICD-10-CM | POA: Diagnosis not present

## 2021-01-18 DIAGNOSIS — K635 Polyp of colon: Secondary | ICD-10-CM | POA: Diagnosis not present

## 2021-01-18 DIAGNOSIS — Z7982 Long term (current) use of aspirin: Secondary | ICD-10-CM | POA: Diagnosis not present

## 2021-01-18 DIAGNOSIS — Z9181 History of falling: Secondary | ICD-10-CM | POA: Diagnosis not present

## 2021-01-18 MED ORDER — PANTOPRAZOLE SODIUM 40 MG PO TBEC: 40.0000 mg | DELAYED_RELEASE_TABLET | Freq: Every day | ORAL | 1 refills | Status: AC

## 2021-01-18 MED ORDER — ALBUTEROL SULFATE HFA 108 (90 BASE) MCG/ACT IN AERS: 2.0000 | INHALATION_SPRAY | Freq: Four times a day (QID) | RESPIRATORY_TRACT | 1 refills | Status: AC | PRN

## 2021-01-18 NOTE — Assessment & Plan Note (Signed)
Missed follow up with Endocrinology due to being in the hospital. Patient's daughter plans to make her follow up and get the lab work done as soon as possible.

## 2021-01-18 NOTE — Assessment & Plan Note (Signed)
Has followed up with GI this week.  Labs ordered today. Will make recommendations based on lab work. May place referral to Hematology if CBC remains low.

## 2021-01-18 NOTE — Progress Notes (Signed)
BP 115/64    Pulse 74    Wt 161 lb (73 kg)    SpO2 (!) 89%    BMI 25.99 kg/m    Subjective:    Patient ID: Maria Neal, female    DOB: September 19, 1950, 71 y.o.   MRN: [id number]  HPI: Maria Neal is a 71 y.o. female  Chief Complaint  Patient presents with   Abdominal Pain    Patient here to follow up on abdominal pain, was seen at ED on 1/9. Patient states she is feeling better today.    Patient states she is feeling better today after being seen in the Er and told it was GERD. She has follow up with Dr. Allen Norris on Thursday.  She missed her appointment with endocrinology due to being in the hospital.  She states she is doing well today.  The swelling has improved.  States the protonix is helping with her pain. Her SOB is no worse than normal.  Sats are in the high 80s on 3L which is her baseline. Lower extremity edema has improved also.    Relevant past medical, surgical, family and social history reviewed and updated as indicated. Interim medical history since our last visit reviewed. Allergies and medications reviewed and updated.  Review of Systems  Respiratory:  Negative for shortness of breath.   Cardiovascular:  Negative for leg swelling.  Gastrointestinal:  Negative for abdominal pain.   Per HPI unless specifically indicated above     Objective:    BP 115/64    Pulse 74    Wt 161 lb (73 kg)    SpO2 (!) 89%    BMI 25.99 kg/m   Wt Readings from Last 3 Encounters:  01/18/21 161 lb (73 kg)  01/10/21 157 lb 3 oz (71.3 kg)  01/10/21 157 lb 6.4 oz (71.4 kg)    Physical Exam Vitals and nursing note reviewed.  Constitutional:      General: She is not in acute distress.    Appearance: Normal appearance. She is normal weight. She is not ill-appearing, toxic-appearing or diaphoretic.  HENT:     Head: Normocephalic.     Right Ear: External ear normal.     Left Ear: External ear normal.     Nose: Nose normal.     Mouth/Throat:     Mouth: Mucous membranes are moist.     Pharynx:  Oropharynx is clear.  Eyes:     General:        Right eye: No discharge.        Left eye: No discharge.     Extraocular Movements: Extraocular movements intact.     Conjunctiva/sclera: Conjunctivae normal.     Pupils: Pupils are equal, round, and reactive to light.  Cardiovascular:     Rate and Rhythm: Normal rate and regular rhythm.     Heart sounds: Murmur heard.     Comments: Lips are blue at the beginning of visit then improves with rest- this is patient's baseline Pulmonary:     Effort: Pulmonary effort is normal. No respiratory distress.     Breath sounds: Normal breath sounds. No wheezing or rales.  Musculoskeletal:     Cervical back: Normal range of motion and neck supple.  Skin:    General: Skin is warm and dry.     Capillary Refill: Capillary refill takes less than 2 seconds.  Neurological:     General: No focal deficit present.     Mental Status: She is  alert and oriented to person, place, and time. Mental status is at baseline.  Psychiatric:        Mood and Affect: Mood normal.        Behavior: Behavior normal.        Thought Content: Thought content normal.        Judgment: Judgment normal.    Results for orders placed or performed in visit on 01/18/21  Comp Met (CMET)  Result Value Ref Range   Glucose 108 (H) 70 - 99 mg/dL   BUN 46 (H) 8 - 27 mg/dL   Creatinine, Ser 2.15 (H) 0.57 - 1.00 mg/dL   eGFR 24 (L) >59 mL/min/1.73   BUN/Creatinine Ratio 21 12 - 28   Sodium 136 134 - 144 mmol/L   Potassium 4.5 3.5 - 5.2 mmol/L   Chloride 91 (L) 96 - 106 mmol/L   CO2 28 20 - 29 mmol/L   Calcium 9.1 8.7 - 10.3 mg/dL   Total Protein 7.0 6.0 - 8.5 g/dL   Albumin 3.8 3.8 - 4.8 g/dL   Globulin, Total 3.2 1.5 - 4.5 g/dL   Albumin/Globulin Ratio 1.2 1.2 - 2.2   Bilirubin Total 0.8 0.0 - 1.2 mg/dL   Alkaline Phosphatase 112 44 - 121 IU/L   AST 24 0 - 40 IU/L   ALT 20 0 - 32 IU/L  CBC w/Diff  Result Value Ref Range   WBC 4.9 3.4 - 10.8 x10E3/uL   RBC 3.47 (L) 3.77 -  5.28 x10E6/uL   Hemoglobin 8.9 (L) 11.1 - 15.9 g/dL   Hematocrit 30.4 (L) 34.0 - 46.6 %   MCV 88 79 - 97 fL   MCH 25.6 (L) 26.6 - 33.0 pg   MCHC 29.3 (L) 31.5 - 35.7 g/dL   RDW 17.4 (H) 11.7 - 15.4 %   Platelets 195 150 - 450 x10E3/uL   Neutrophils 57 Not Estab. %   Lymphs 28 Not Estab. %   Monocytes 9 Not Estab. %   Eos 4 Not Estab. %   Basos 1 Not Estab. %   Neutrophils Absolute 2.8 1.4 - 7.0 x10E3/uL   Lymphocytes Absolute 1.4 0.7 - 3.1 x10E3/uL   Monocytes Absolute 0.5 0.1 - 0.9 x10E3/uL   EOS (ABSOLUTE) 0.2 0.0 - 0.4 x10E3/uL   Basophils Absolute 0.1 0.0 - 0.2 x10E3/uL   Immature Granulocytes 1 Not Estab. %   Immature Grans (Abs) 0.0 0.0 - 0.1 x10E3/uL   NRBC 1 (H) 0 - 0 %      Assessment & Plan:   Problem List Items Addressed This Visit       Respiratory   COPD, severe (HCC)    Chronic. Improved from last visit. O2 sats at 89% in office today on baseline of 3L of O2. Continue with home health and follow up in 1 month for reevaluation.       Relevant Medications   albuterol (VENTOLIN HFA) 108 (90 Base) MCG/ACT inhaler     Digestive   Acute GI bleeding - Primary    Has followed up with GI this week.  Labs ordered today. Will make recommendations based on lab work. May place referral to Hematology if CBC remains low.       Relevant Orders   Comp Met (CMET) (Completed)   CBC w/Diff (Completed)     Endocrine   Hypothyroidism    Missed follow up with Endocrinology due to being in the hospital. Patient's daughter plans to make her follow up and get the  lab work done as soon as possible.          Follow up plan: Return in about 1 month (around 02/18/2021) for COPD and GI bleed.   A total of 30 minutes were spent on this encounter today.  When total time is documented, this includes both the face-to-face and non-face-to-face time personally spent before, during and after the visit on the date of the encounter discussing plan of care and follow up with specialists.

## 2021-01-18 NOTE — Assessment & Plan Note (Signed)
Chronic. Improved from last visit. O2 sats at 89% in office today on baseline of 3L of O2. Continue with home health and follow up in 1 month for reevaluation.

## 2021-01-19 ENCOUNTER — Telehealth: Payer: Medicare Other

## 2021-01-19 DIAGNOSIS — J4520 Mild intermittent asthma, uncomplicated: Secondary | ICD-10-CM | POA: Diagnosis not present

## 2021-01-19 DIAGNOSIS — J209 Acute bronchitis, unspecified: Secondary | ICD-10-CM | POA: Diagnosis not present

## 2021-01-19 DIAGNOSIS — R0609 Other forms of dyspnea: Secondary | ICD-10-CM | POA: Diagnosis not present

## 2021-01-19 DIAGNOSIS — J984 Other disorders of lung: Secondary | ICD-10-CM | POA: Diagnosis not present

## 2021-01-19 LAB — CBC WITH DIFFERENTIAL/PLATELET
Basophils Absolute: 0.1 10*3/uL (ref 0.0–0.2)
Basos: 1 %
EOS (ABSOLUTE): 0.2 10*3/uL (ref 0.0–0.4)
Eos: 4 %
Hematocrit: 30.4 % — ABNORMAL LOW (ref 34.0–46.6)
Hemoglobin: 8.9 g/dL — ABNORMAL LOW (ref 11.1–15.9)
Immature Grans (Abs): 0 10*3/uL (ref 0.0–0.1)
Immature Granulocytes: 1 %
Lymphocytes Absolute: 1.4 10*3/uL (ref 0.7–3.1)
Lymphs: 28 %
MCH: 25.6 pg — ABNORMAL LOW (ref 26.6–33.0)
MCHC: 29.3 g/dL — ABNORMAL LOW (ref 31.5–35.7)
MCV: 88 fL (ref 79–97)
Monocytes Absolute: 0.5 10*3/uL (ref 0.1–0.9)
Monocytes: 9 %
NRBC: 1 % — ABNORMAL HIGH (ref 0–0)
Neutrophils Absolute: 2.8 10*3/uL (ref 1.4–7.0)
Neutrophils: 57 %
Platelets: 195 10*3/uL (ref 150–450)
RBC: 3.47 x10E6/uL — ABNORMAL LOW (ref 3.77–5.28)
RDW: 17.4 % — ABNORMAL HIGH (ref 11.7–15.4)
WBC: 4.9 10*3/uL (ref 3.4–10.8)

## 2021-01-19 LAB — COMPREHENSIVE METABOLIC PANEL
ALT: 20 IU/L (ref 0–32)
AST: 24 IU/L (ref 0–40)
Albumin/Globulin Ratio: 1.2 (ref 1.2–2.2)
Albumin: 3.8 g/dL (ref 3.8–4.8)
Alkaline Phosphatase: 112 IU/L (ref 44–121)
BUN/Creatinine Ratio: 21 (ref 12–28)
BUN: 46 mg/dL — ABNORMAL HIGH (ref 8–27)
Bilirubin Total: 0.8 mg/dL (ref 0.0–1.2)
CO2: 28 mmol/L (ref 20–29)
Calcium: 9.1 mg/dL (ref 8.7–10.3)
Chloride: 91 mmol/L — ABNORMAL LOW (ref 96–106)
Creatinine, Ser: 2.15 mg/dL — ABNORMAL HIGH (ref 0.57–1.00)
Globulin, Total: 3.2 g/dL (ref 1.5–4.5)
Glucose: 108 mg/dL — ABNORMAL HIGH (ref 70–99)
Potassium: 4.5 mmol/L (ref 3.5–5.2)
Sodium: 136 mmol/L (ref 134–144)
Total Protein: 7 g/dL (ref 6.0–8.5)
eGFR: 24 mL/min/{1.73_m2} — ABNORMAL LOW (ref >59)

## 2021-01-19 NOTE — Progress Notes (Signed)
Please let patient's daughter know that her kidney function declined some.  We should repeat this on Monday to see if it comes back to normal. Make sure she is drinking plenty of water and avoid any NSAIDS if she is taking any. Her complete blood count improved from her the last time it was drawn. We will continue to monitor this in the future.

## 2021-01-19 NOTE — Addendum Note (Signed)
Addended by: Jon Billings on: 01/19/2021 08:52 AM   Modules accepted: Orders

## 2021-01-19 NOTE — Progress Notes (Signed)
Lab order placed.

## 2021-01-19 NOTE — Telephone Encounter (Signed)
Called patient to discuss lab results. Patient's daughter asked about Endo referral. Appointment is scheduled for [facility] 01/24/21 in [place]. Patient's daughter states that she does not want to travel to [place] and would like to see someone locally.   Evelena Peat can you look into this and see if we can send referral sent to a local facility please?

## 2021-01-20 ENCOUNTER — Ambulatory Visit: Payer: Medicare Other | Admitting: Gastroenterology

## 2021-01-24 ENCOUNTER — Other Ambulatory Visit: Payer: Medicare Other

## 2021-01-24 ENCOUNTER — Ambulatory Visit: Payer: Medicare Other | Admitting: Endocrinology

## 2021-01-25 NOTE — Telephone Encounter (Signed)
Patient's daughter notified of Jessie's message.

## 2021-01-26 ENCOUNTER — Ambulatory Visit: Payer: Medicare Other

## 2021-01-27 ENCOUNTER — Other Ambulatory Visit: Payer: Self-pay | Admitting: Nurse Practitioner

## 2021-01-27 ENCOUNTER — Ambulatory Visit: Payer: Self-pay

## 2021-01-27 NOTE — Telephone Encounter (Signed)
Reason for Disposition  [1] Cause unknown AND [2] present > 7 days  Answer Assessment - Initial Assessment Questions 1. DESCRIPTION: "Describe the itching you are having." "Where is it located?"     Daughter calling in that her mother's feet have been itching for 2 weeks.   Worse at night.   She tried Benadryl, foot lotion, rubbing alcohol and cream from Jon Billings, NP 2. SEVERITY: "How bad is it?"    - MILD - doesn't interfere with normal activities   - MODERATE-SEVERE: interferes with work, school, sleep, or other activities      *No Answer* 3. SCRATCHING: "Are there any scratch marks? Bleeding?"     *No Answer* 4. ONSET: "When did the itching begin?"      *No Answer* 5. CAUSE: "What do you think is causing the itching?"      *No Answer* 6. OTHER SYMPTOMS: "Do you have any other symptoms?"      *No Answer* 7. PREGNANCY: "Is there any chance you are pregnant?" "When was your last menstrual period?"     *No Answer*  Protocols used: Itching - Localized-A-AH  Chief Complaint: feet itching for 2 weeks worse at night Symptoms: Has tried multiple OTC remedies without relief Frequency: 2 weeks worse at night Pertinent Negatives: Patient denies N/A Disposition: [] ED /[] Urgent Care (no appt availability in office) / [x] Appointment(In office/virtual)/ []  [facility]/ [] Home Care/ [] Refused Recommended Disposition /[] [facility]/ []  Follow-up with PCP Additional Notes: Appt made.    Pt has a 10:15 lab appt that day.  She is wanting to know if it's ok to have her labs drawn when she comes in for her appt at 2:00 with Jon Billings, NP.

## 2021-01-27 NOTE — Telephone Encounter (Signed)
Yes we can draw her labs at her appt.  Okay to cancel lab appt.

## 2021-01-27 NOTE — Telephone Encounter (Signed)
Pt made aware of change

## 2021-01-27 NOTE — Chronic Care Management (AMB) (Signed)
°  Chronic Care Management   Outreach Note  01/27/2021 Name: Maria Neal MRN: [id number] DOB: 07-Apr-1950  Maria Neal is a 71 y.o. year old female who is a primary care patient of Jon Billings, NP. I reached out to [name] by phone today in response to a referral sent by [name]'s primary care provider.  A second unsuccessful telephone outreach was attempted today. The patient was referred to the case management team for assistance with care management and care coordination.   Follow Up Plan: The care management team will reach out to the patient again over the next 7 days.  If patient returns call to provider office, please advise to call [place] * at [phone]*  Noreene Larsson, [place], [facility]  [place], [place] [address] Direct Dial: [phone] [email or web address]

## 2021-01-27 NOTE — Telephone Encounter (Signed)
Patient called, left VM to return the call to the office to discuss symptoms with a nurse.   Summary: itching feet   Pts feet itch so bad at night that she can not sleep for the past 2 weeks /pt has tried everything OTC but nothing has helped / please advise

## 2021-01-27 NOTE — Telephone Encounter (Signed)
Lab appt cancelled 

## 2021-01-27 NOTE — Telephone Encounter (Signed)
Requested medication (s) are due for refill today: Yes  Requested medication (s) are on the active medication list: Yes  Last refill:  12/06/20  Future visit scheduled: Yes  Notes to clinic:  See request.    Requested Prescriptions  Pending Prescriptions Disp Refills   midodrine (PROAMATINE) 5 MG tablet [Pharmacy Med Name: MIDODRINE HCL 5 MG TAB] 90 tablet 0    Sig: TAKE 1 TABLET BY MOUTH 3 TIMES DAILY WITH MEALS     Not Delegated - Cardiovascular: Midodrine Failed - 01/27/2021 10:48 AM      Failed - This refill cannot be delegated      Passed - Last BP in normal range    BP Readings from Last 1 Encounters:  01/18/21 115/64          Passed - Valid encounter within last 12 months    Recent Outpatient Visits           1 week ago Acute GI bleeding   [place], NP   2 weeks ago Hypoxemia   [facility] Vigg, Avanti, MD   1 month ago Chronic systolic congestive heart failure, NYHA class 3 ([place])   [facility] Jon Billings, NP   2 months ago Hospital discharge follow-up   [facility] Jon Billings, NP   2 months ago COPD exacerbation [facility])   [facility] Jon Billings, NP       Future Appointments             In 4 days Jon Billings, NP [facility], [place]   In 3 weeks Jon Billings, NP [facility], [place]   In 1 month Lucilla Lame, MD [facility] [place]

## 2021-01-28 DIAGNOSIS — E039 Hypothyroidism, unspecified: Secondary | ICD-10-CM | POA: Diagnosis not present

## 2021-01-28 DIAGNOSIS — Z9981 Dependence on supplemental oxygen: Secondary | ICD-10-CM | POA: Diagnosis not present

## 2021-01-28 DIAGNOSIS — Z7982 Long term (current) use of aspirin: Secondary | ICD-10-CM | POA: Diagnosis not present

## 2021-01-28 DIAGNOSIS — K635 Polyp of colon: Secondary | ICD-10-CM | POA: Diagnosis not present

## 2021-01-28 DIAGNOSIS — I110 Hypertensive heart disease with heart failure: Secondary | ICD-10-CM | POA: Diagnosis not present

## 2021-01-28 DIAGNOSIS — Z9181 History of falling: Secondary | ICD-10-CM | POA: Diagnosis not present

## 2021-01-28 DIAGNOSIS — I2510 Atherosclerotic heart disease of native coronary artery without angina pectoris: Secondary | ICD-10-CM | POA: Diagnosis not present

## 2021-01-28 DIAGNOSIS — J9610 Chronic respiratory failure, unspecified whether with hypoxia or hypercapnia: Secondary | ICD-10-CM | POA: Diagnosis not present

## 2021-01-28 DIAGNOSIS — I428 Other cardiomyopathies: Secondary | ICD-10-CM | POA: Diagnosis not present

## 2021-01-28 DIAGNOSIS — G2581 Restless legs syndrome: Secondary | ICD-10-CM | POA: Diagnosis not present

## 2021-01-28 DIAGNOSIS — I5022 Chronic systolic (congestive) heart failure: Secondary | ICD-10-CM | POA: Diagnosis not present

## 2021-01-28 DIAGNOSIS — E538 Deficiency of other specified B group vitamins: Secondary | ICD-10-CM | POA: Diagnosis not present

## 2021-01-28 DIAGNOSIS — J449 Chronic obstructive pulmonary disease, unspecified: Secondary | ICD-10-CM | POA: Diagnosis not present

## 2021-01-28 DIAGNOSIS — I482 Chronic atrial fibrillation, unspecified: Secondary | ICD-10-CM | POA: Diagnosis not present

## 2021-01-28 DIAGNOSIS — D500 Iron deficiency anemia secondary to blood loss (chronic): Secondary | ICD-10-CM | POA: Diagnosis not present

## 2021-01-31 ENCOUNTER — Ambulatory Visit (INDEPENDENT_AMBULATORY_CARE_PROVIDER_SITE_OTHER): Payer: Medicare Other | Admitting: Nurse Practitioner

## 2021-01-31 ENCOUNTER — Encounter: Payer: Self-pay | Admitting: Nurse Practitioner

## 2021-01-31 ENCOUNTER — Ambulatory Visit: Payer: Medicare Other

## 2021-01-31 ENCOUNTER — Other Ambulatory Visit: Payer: Self-pay

## 2021-01-31 VITALS — BP 103/57 | HR 83 | Temp 98.3°F | Ht 65.75 in | Wt 161.0 lb

## 2021-01-31 DIAGNOSIS — E039 Hypothyroidism, unspecified: Secondary | ICD-10-CM | POA: Diagnosis not present

## 2021-01-31 DIAGNOSIS — W19XXXD Unspecified fall, subsequent encounter: Secondary | ICD-10-CM | POA: Diagnosis not present

## 2021-01-31 DIAGNOSIS — N1831 Chronic kidney disease, stage 3a: Secondary | ICD-10-CM | POA: Diagnosis not present

## 2021-01-31 DIAGNOSIS — B351 Tinea unguium: Secondary | ICD-10-CM | POA: Diagnosis not present

## 2021-01-31 NOTE — Assessment & Plan Note (Signed)
Labs ordered today.  Will make recommendations based on lab results. ?

## 2021-01-31 NOTE — Telephone Encounter (Signed)
Patient was seen today in Office by PCP

## 2021-01-31 NOTE — Progress Notes (Signed)
BP (!) 103/57    Pulse 83    Temp 98.3 F (36.8 C) (Oral)    Ht 5' 5.75" (1.67 m)    Wt 161 lb (73 kg)    BMI 26.19 kg/m    Subjective:    Patient ID: Maria Neal, female    DOB: 04-16-1950, 71 y.o.   MRN: [id number]  HPI: Maria Neal is a 71 y.o. female  Chief Complaint  Patient presents with   Itchy feet    B/L foot itching for about a month, they itch all the time. Was given Triamcinolone cream which is not helping. Has been putting rubbing alcohol on them to sleep at night   B/L foot itching for about a month, they itch all the time. Was given Triamcinolone cream which is not helping. Has been putting rubbing alcohol on them to sleep at night.  Patient has not followed up with the Endocrinologist. Will draw the labs in office today and send to Endocrinologist at [facility].   Patient has fallen 3 times in the last week. Would like to restart the physical therapy. [phone]  Relevant past medical, surgical, family and social history reviewed and updated as indicated. Interim medical history since our last visit reviewed. Allergies and medications reviewed and updated.  Review of Systems  Skin:        Itching on feet as well as upper back   Per HPI unless specifically indicated above     Objective:    BP (!) 103/57    Pulse 83    Temp 98.3 F (36.8 C) (Oral)    Ht 5' 5.75" (1.67 m)    Wt 161 lb (73 kg)    BMI 26.19 kg/m   Wt Readings from Last 3 Encounters:  01/31/21 161 lb (73 kg)  01/18/21 161 lb (73 kg)  01/10/21 157 lb 3 oz (71.3 kg)    Physical Exam Vitals and nursing note reviewed.  Constitutional:      General: She is not in acute distress.    Appearance: Normal appearance. She is normal weight. She is not ill-appearing, toxic-appearing or diaphoretic.  HENT:     Head: Normocephalic.     Right Ear: External ear normal.     Left Ear: External ear normal.     Nose: Nose normal.     Mouth/Throat:     Mouth: Mucous membranes are moist.     Pharynx:  Oropharynx is clear.  Eyes:     General:        Right eye: No discharge.        Left eye: No discharge.     Extraocular Movements: Extraocular movements intact.     Conjunctiva/sclera: Conjunctivae normal.     Pupils: Pupils are equal, round, and reactive to light.  Cardiovascular:     Rate and Rhythm: Normal rate and regular rhythm.     Heart sounds: No murmur heard. Pulmonary:     Effort: Pulmonary effort is normal. No respiratory distress.     Breath sounds: Normal breath sounds. No wheezing or rales.  Musculoskeletal:     Cervical back: Normal range of motion and neck supple.  Feet:     Right foot:     Skin integrity: Erythema and dry skin present.     Toenail Condition: Right toenails are abnormally thick and long. Fungal disease present.    Left foot:     Skin integrity: Erythema and dry skin present.     Toenail  Condition: Left toenails are abnormally thick and long. Fungal disease present. Skin:    General: Skin is warm and dry.     Capillary Refill: Capillary refill takes less than 2 seconds.     Findings: Erythema (on feet) present.  Neurological:     General: No focal deficit present.     Mental Status: She is alert and oriented to person, place, and time. Mental status is at baseline.  Psychiatric:        Mood and Affect: Mood normal.        Behavior: Behavior normal.        Thought Content: Thought content normal.        Judgment: Judgment normal.    Results for orders placed or performed in visit on 01/18/21  Comp Met (CMET)  Result Value Ref Range   Glucose 108 (H) 70 - 99 mg/dL   BUN 46 (H) 8 - 27 mg/dL   Creatinine, Ser 2.15 (H) 0.57 - 1.00 mg/dL   eGFR 24 (L) >59 mL/min/1.73   BUN/Creatinine Ratio 21 12 - 28   Sodium 136 134 - 144 mmol/L   Potassium 4.5 3.5 - 5.2 mmol/L   Chloride 91 (L) 96 - 106 mmol/L   CO2 28 20 - 29 mmol/L   Calcium 9.1 8.7 - 10.3 mg/dL   Total Protein 7.0 6.0 - 8.5 g/dL   Albumin 3.8 3.8 - 4.8 g/dL   Globulin, Total 3.2 1.5 -  4.5 g/dL   Albumin/Globulin Ratio 1.2 1.2 - 2.2   Bilirubin Total 0.8 0.0 - 1.2 mg/dL   Alkaline Phosphatase 112 44 - 121 IU/L   AST 24 0 - 40 IU/L   ALT 20 0 - 32 IU/L  CBC w/Diff  Result Value Ref Range   WBC 4.9 3.4 - 10.8 x10E3/uL   RBC 3.47 (L) 3.77 - 5.28 x10E6/uL   Hemoglobin 8.9 (L) 11.1 - 15.9 g/dL   Hematocrit 30.4 (L) 34.0 - 46.6 %   MCV 88 79 - 97 fL   MCH 25.6 (L) 26.6 - 33.0 pg   MCHC 29.3 (L) 31.5 - 35.7 g/dL   RDW 17.4 (H) 11.7 - 15.4 %   Platelets 195 150 - 450 x10E3/uL   Neutrophils 57 Not Estab. %   Lymphs 28 Not Estab. %   Monocytes 9 Not Estab. %   Eos 4 Not Estab. %   Basos 1 Not Estab. %   Neutrophils Absolute 2.8 1.4 - 7.0 x10E3/uL   Lymphocytes Absolute 1.4 0.7 - 3.1 x10E3/uL   Monocytes Absolute 0.5 0.1 - 0.9 x10E3/uL   EOS (ABSOLUTE) 0.2 0.0 - 0.4 x10E3/uL   Basophils Absolute 0.1 0.0 - 0.2 x10E3/uL   Immature Granulocytes 1 Not Estab. %   Immature Grans (Abs) 0.0 0.0 - 0.1 x10E3/uL   NRBC 1 (H) 0 - 0 %      Assessment & Plan:   Problem List Items Addressed This Visit       Endocrine   Hypothyroidism    Chronic. Patient was seeing [facility] in [place] but it was too far.  Has an appointment in March with Jefm Bryant. Will draw labs in office today.  Suspect itching is due to uncontrolled thyroid disease.  Will make recommendations based on lab results.       Relevant Orders   TSH   T4, free     Genitourinary   Chronic kidney disease, stage 3a ([place]) - Primary    Labs ordered today. Will make recommendations  based on lab results      Relevant Orders   Comp Met (CMET)   TSH   T4, free   Other Visit Diagnoses     Toenail fungus       Referral placed for patient to see Podiatry for further evaluation and toenial trimming.   Relevant Orders   Ambulatory referral to Podiatry   Fall, subsequent encounter       Will see if PT can be reinitiated through home health. Will call and give verbal order.        Follow up plan: Return if  symptoms worsen or fail to improve.

## 2021-01-31 NOTE — Assessment & Plan Note (Signed)
Chronic. Patient was seeing [facility] in [place] but it was too far.  Has an appointment in March with Jefm Bryant. Will draw labs in office today.  Suspect itching is due to uncontrolled thyroid disease.  Will make recommendations based on lab results.

## 2021-01-31 NOTE — Telephone Encounter (Signed)
Left message in regards to extending patient's physical therapy per Santiago Glad as the patient has fallen 3 times within the last week. Advised someone from the [facility] to give our office a call back at their earliest convenience.

## 2021-01-31 NOTE — Telephone Encounter (Signed)
[phone]- this is the phone number for her home health company. Can we call and see if PT can be extended. She has fallen 3 times in the last week.

## 2021-02-01 LAB — COMPREHENSIVE METABOLIC PANEL
ALT: 20 IU/L (ref 0–32)
AST: 26 IU/L (ref 0–40)
Albumin/Globulin Ratio: 1.2 (ref 1.2–2.2)
Albumin: 3.5 g/dL — ABNORMAL LOW (ref 3.8–4.8)
Alkaline Phosphatase: 125 IU/L — ABNORMAL HIGH (ref 44–121)
BUN/Creatinine Ratio: 23 (ref 12–28)
BUN: 47 mg/dL — ABNORMAL HIGH (ref 8–27)
Bilirubin Total: 0.8 mg/dL (ref 0.0–1.2)
CO2: 32 mmol/L — ABNORMAL HIGH (ref 20–29)
Calcium: 9 mg/dL (ref 8.7–10.3)
Chloride: 94 mmol/L — ABNORMAL LOW (ref 96–106)
Creatinine, Ser: 2.03 mg/dL — ABNORMAL HIGH (ref 0.57–1.00)
Globulin, Total: 3 g/dL (ref 1.5–4.5)
Glucose: 115 mg/dL — ABNORMAL HIGH (ref 70–99)
Potassium: 4.3 mmol/L (ref 3.5–5.2)
Sodium: 141 mmol/L (ref 134–144)
Total Protein: 6.5 g/dL (ref 6.0–8.5)
eGFR: 26 mL/min/{1.73_m2} — ABNORMAL LOW (ref >59)

## 2021-02-01 LAB — TSH: TSH: 2.35 u[IU]/mL (ref 0.450–4.500)

## 2021-02-01 LAB — T4, FREE: Free T4: 2.07 ng/dL — ABNORMAL HIGH (ref 0.82–1.77)

## 2021-02-01 MED ORDER — PREDNISONE 10 MG PO TABS: 10.0000 mg | ORAL_TABLET | Freq: Every day | ORAL | 0 refills | Status: DC

## 2021-02-01 NOTE — Progress Notes (Signed)
Please let patient know that overall her lab overall looks good.  Her thyroid labs have improved and do not indicate why she is itching. I have sent in a course of prednisone to see if we can get it under control.  She should continue on the same dose of Levothyroxine.  We will recheck at our next visit.

## 2021-02-01 NOTE — Addendum Note (Signed)
Addended by: Jon Billings on: 02/01/2021 09:46 AM   Modules accepted: Orders

## 2021-02-02 ENCOUNTER — Ambulatory Visit
Admission: RE | Admit: 2021-02-02 | Discharge: 2021-02-02 | Disposition: A | Discharge status: Home / Self Care | Payer: Medicare Other | Source: Ambulatory Visit | Attending: Nurse Practitioner | Admitting: Nurse Practitioner

## 2021-02-02 ENCOUNTER — Other Ambulatory Visit: Payer: Self-pay | Admitting: Nurse Practitioner

## 2021-02-02 ENCOUNTER — Other Ambulatory Visit: Payer: Self-pay

## 2021-02-02 DIAGNOSIS — R921 Mammographic calcification found on diagnostic imaging of breast: Secondary | ICD-10-CM

## 2021-02-02 DIAGNOSIS — R928 Other abnormal and inconclusive findings on diagnostic imaging of breast: Secondary | ICD-10-CM | POA: Insufficient documentation

## 2021-02-02 DIAGNOSIS — N6489 Other specified disorders of breast: Secondary | ICD-10-CM | POA: Diagnosis not present

## 2021-02-02 DIAGNOSIS — R922 Inconclusive mammogram: Secondary | ICD-10-CM | POA: Diagnosis not present

## 2021-02-02 NOTE — Progress Notes (Signed)
Radiologist discussed results with patient. They have biopsy scheduled.

## 2021-02-03 DIAGNOSIS — E538 Deficiency of other specified B group vitamins: Secondary | ICD-10-CM | POA: Diagnosis not present

## 2021-02-03 DIAGNOSIS — I428 Other cardiomyopathies: Secondary | ICD-10-CM | POA: Diagnosis not present

## 2021-02-03 DIAGNOSIS — K635 Polyp of colon: Secondary | ICD-10-CM | POA: Diagnosis not present

## 2021-02-03 DIAGNOSIS — J9610 Chronic respiratory failure, unspecified whether with hypoxia or hypercapnia: Secondary | ICD-10-CM | POA: Diagnosis not present

## 2021-02-03 DIAGNOSIS — I110 Hypertensive heart disease with heart failure: Secondary | ICD-10-CM | POA: Diagnosis not present

## 2021-02-03 DIAGNOSIS — Z9181 History of falling: Secondary | ICD-10-CM | POA: Diagnosis not present

## 2021-02-03 DIAGNOSIS — I2510 Atherosclerotic heart disease of native coronary artery without angina pectoris: Secondary | ICD-10-CM | POA: Diagnosis not present

## 2021-02-03 DIAGNOSIS — Z9981 Dependence on supplemental oxygen: Secondary | ICD-10-CM | POA: Diagnosis not present

## 2021-02-03 DIAGNOSIS — G2581 Restless legs syndrome: Secondary | ICD-10-CM | POA: Diagnosis not present

## 2021-02-03 DIAGNOSIS — I5022 Chronic systolic (congestive) heart failure: Secondary | ICD-10-CM | POA: Diagnosis not present

## 2021-02-03 DIAGNOSIS — D500 Iron deficiency anemia secondary to blood loss (chronic): Secondary | ICD-10-CM | POA: Diagnosis not present

## 2021-02-03 DIAGNOSIS — J449 Chronic obstructive pulmonary disease, unspecified: Secondary | ICD-10-CM | POA: Diagnosis not present

## 2021-02-03 DIAGNOSIS — I482 Chronic atrial fibrillation, unspecified: Secondary | ICD-10-CM | POA: Diagnosis not present

## 2021-02-03 DIAGNOSIS — Z7982 Long term (current) use of aspirin: Secondary | ICD-10-CM | POA: Diagnosis not present

## 2021-02-03 DIAGNOSIS — E039 Hypothyroidism, unspecified: Secondary | ICD-10-CM | POA: Diagnosis not present

## 2021-02-04 ENCOUNTER — Telehealth: Payer: Self-pay | Admitting: Nurse Practitioner

## 2021-02-04 NOTE — Telephone Encounter (Signed)
Copied from [facility] [phone]. Topic: General - Other >> Feb 04, 2021  1:08 PM Tessa Lerner A wrote: Reason for CRM: Tina with [facility] has returned a call form the practice to discuss the patient's physical therapy   Please contact further when possible

## 2021-02-08 DIAGNOSIS — J9690 Respiratory failure, unspecified, unspecified whether with hypoxia or hypercapnia: Secondary | ICD-10-CM | POA: Diagnosis not present

## 2021-02-08 DIAGNOSIS — J449 Chronic obstructive pulmonary disease, unspecified: Secondary | ICD-10-CM | POA: Diagnosis not present

## 2021-02-10 DIAGNOSIS — I428 Other cardiomyopathies: Secondary | ICD-10-CM | POA: Diagnosis not present

## 2021-02-10 DIAGNOSIS — J449 Chronic obstructive pulmonary disease, unspecified: Secondary | ICD-10-CM | POA: Diagnosis not present

## 2021-02-10 DIAGNOSIS — I482 Chronic atrial fibrillation, unspecified: Secondary | ICD-10-CM | POA: Diagnosis not present

## 2021-02-10 DIAGNOSIS — I5022 Chronic systolic (congestive) heart failure: Secondary | ICD-10-CM | POA: Diagnosis not present

## 2021-02-10 DIAGNOSIS — K635 Polyp of colon: Secondary | ICD-10-CM | POA: Diagnosis not present

## 2021-02-10 DIAGNOSIS — D500 Iron deficiency anemia secondary to blood loss (chronic): Secondary | ICD-10-CM | POA: Diagnosis not present

## 2021-02-10 DIAGNOSIS — E039 Hypothyroidism, unspecified: Secondary | ICD-10-CM | POA: Diagnosis not present

## 2021-02-10 DIAGNOSIS — I110 Hypertensive heart disease with heart failure: Secondary | ICD-10-CM | POA: Diagnosis not present

## 2021-02-10 DIAGNOSIS — J9610 Chronic respiratory failure, unspecified whether with hypoxia or hypercapnia: Secondary | ICD-10-CM | POA: Diagnosis not present

## 2021-02-10 DIAGNOSIS — E538 Deficiency of other specified B group vitamins: Secondary | ICD-10-CM | POA: Diagnosis not present

## 2021-02-10 DIAGNOSIS — Z7982 Long term (current) use of aspirin: Secondary | ICD-10-CM | POA: Diagnosis not present

## 2021-02-10 DIAGNOSIS — G2581 Restless legs syndrome: Secondary | ICD-10-CM | POA: Diagnosis not present

## 2021-02-10 DIAGNOSIS — Z9181 History of falling: Secondary | ICD-10-CM | POA: Diagnosis not present

## 2021-02-10 DIAGNOSIS — Z9981 Dependence on supplemental oxygen: Secondary | ICD-10-CM | POA: Diagnosis not present

## 2021-02-10 DIAGNOSIS — I2510 Atherosclerotic heart disease of native coronary artery without angina pectoris: Secondary | ICD-10-CM | POA: Diagnosis not present

## 2021-02-10 NOTE — Chronic Care Management (AMB) (Signed)
°  Chronic Care Management   Outreach Note  02/10/2021 Name: Maria Neal MRN: [id number] DOB: 1950/09/11  Maria Neal is a 71 y.o. year old female who is a primary care patient of Jon Billings, NP. I reached out to Maria Neal by phone today in response to a referral sent by Maria Neal's primary care provider.  Third unsuccessful telephone outreach was attempted today. The patient was referred to the case management team for assistance with care management and care coordination. The patient's primary care provider has been notified of our unsuccessful attempts to make or maintain contact with the patient. The care management team is pleased to engage with this patient at any time in the future should he/she be interested in assistance from the care management team.   Follow Up Plan: We have been unable to make contact with the patient for follow up. The care management team is available to follow up with the patient after provider conversation with the patient regarding recommendation for care management engagement and subsequent re-referral to the care management team.   Noreene Larsson, [place], [place], [place] [address] Direct Dial: [phone] [email or web address]

## 2021-02-10 NOTE — Telephone Encounter (Signed)
Please find out what Maria Neal needs in order to continue patient's physical therapy.

## 2021-02-10 NOTE — Telephone Encounter (Signed)
Called and LVM asking for Otila Kluver to please return my call.

## 2021-02-15 ENCOUNTER — Emergency Department: Payer: Medicare Other

## 2021-02-15 ENCOUNTER — Encounter: Payer: Self-pay | Admitting: Emergency Medicine

## 2021-02-15 ENCOUNTER — Ambulatory Visit: Payer: Medicare Other | Admitting: Podiatry

## 2021-02-15 ENCOUNTER — Inpatient Hospital Stay
Admission: EM | Admit: 2021-02-15 | Discharge: 2021-02-19 | DRG: 291 | Disposition: A | Discharge status: Home Health | Payer: Medicare Other | Attending: Hospitalist | Admitting: Hospitalist

## 2021-02-15 ENCOUNTER — Ambulatory Visit: Payer: Self-pay | Admitting: *Deleted

## 2021-02-15 ENCOUNTER — Inpatient Hospital Stay
Admit: 2021-02-15 | Discharge: 2021-02-15 | Disposition: A | Discharge status: Home / Self Care | Payer: Medicare Other | Attending: Family Medicine | Admitting: Family Medicine

## 2021-02-15 ENCOUNTER — Other Ambulatory Visit: Payer: Self-pay

## 2021-02-15 DIAGNOSIS — K219 Gastro-esophageal reflux disease without esophagitis: Secondary | ICD-10-CM | POA: Diagnosis present

## 2021-02-15 DIAGNOSIS — I509 Heart failure, unspecified: Principal | ICD-10-CM

## 2021-02-15 DIAGNOSIS — I2510 Atherosclerotic heart disease of native coronary artery without angina pectoris: Secondary | ICD-10-CM | POA: Diagnosis present

## 2021-02-15 DIAGNOSIS — I5082 Biventricular heart failure: Secondary | ICD-10-CM | POA: Diagnosis not present

## 2021-02-15 DIAGNOSIS — I083 Combined rheumatic disorders of mitral, aortic and tricuspid valves: Secondary | ICD-10-CM | POA: Diagnosis present

## 2021-02-15 DIAGNOSIS — Z20822 Contact with and (suspected) exposure to covid-19: Secondary | ICD-10-CM | POA: Diagnosis not present

## 2021-02-15 DIAGNOSIS — I517 Cardiomegaly: Secondary | ICD-10-CM | POA: Diagnosis not present

## 2021-02-15 DIAGNOSIS — Z79890 Hormone replacement therapy (postmenopausal): Secondary | ICD-10-CM

## 2021-02-15 DIAGNOSIS — I248 Other forms of acute ischemic heart disease: Secondary | ICD-10-CM | POA: Diagnosis present

## 2021-02-15 DIAGNOSIS — Z881 Allergy status to other antibiotic agents status: Secondary | ICD-10-CM | POA: Diagnosis not present

## 2021-02-15 DIAGNOSIS — I9589 Other hypotension: Secondary | ICD-10-CM | POA: Diagnosis not present

## 2021-02-15 DIAGNOSIS — I482 Chronic atrial fibrillation, unspecified: Secondary | ICD-10-CM | POA: Diagnosis not present

## 2021-02-15 DIAGNOSIS — I130 Hypertensive heart and chronic kidney disease with heart failure and stage 1 through stage 4 chronic kidney disease, or unspecified chronic kidney disease: Principal | ICD-10-CM | POA: Diagnosis present

## 2021-02-15 DIAGNOSIS — I5043 Acute on chronic combined systolic (congestive) and diastolic (congestive) heart failure: Secondary | ICD-10-CM | POA: Diagnosis present

## 2021-02-15 DIAGNOSIS — M7989 Other specified soft tissue disorders: Secondary | ICD-10-CM | POA: Diagnosis not present

## 2021-02-15 DIAGNOSIS — Z79899 Other long term (current) drug therapy: Secondary | ICD-10-CM

## 2021-02-15 DIAGNOSIS — G629 Polyneuropathy, unspecified: Secondary | ICD-10-CM | POA: Diagnosis not present

## 2021-02-15 DIAGNOSIS — I480 Paroxysmal atrial fibrillation: Secondary | ICD-10-CM | POA: Diagnosis not present

## 2021-02-15 DIAGNOSIS — J9811 Atelectasis: Secondary | ICD-10-CM | POA: Diagnosis not present

## 2021-02-15 DIAGNOSIS — J811 Chronic pulmonary edema: Secondary | ICD-10-CM | POA: Diagnosis not present

## 2021-02-15 DIAGNOSIS — I5023 Acute on chronic systolic (congestive) heart failure: Secondary | ICD-10-CM

## 2021-02-15 DIAGNOSIS — Z7951 Long term (current) use of inhaled steroids: Secondary | ICD-10-CM

## 2021-02-15 DIAGNOSIS — Z9981 Dependence on supplemental oxygen: Secondary | ICD-10-CM

## 2021-02-15 DIAGNOSIS — N184 Chronic kidney disease, stage 4 (severe): Secondary | ICD-10-CM | POA: Diagnosis present

## 2021-02-15 DIAGNOSIS — I110 Hypertensive heart disease with heart failure: Secondary | ICD-10-CM | POA: Diagnosis not present

## 2021-02-15 DIAGNOSIS — E039 Hypothyroidism, unspecified: Secondary | ICD-10-CM | POA: Diagnosis present

## 2021-02-15 DIAGNOSIS — J449 Chronic obstructive pulmonary disease, unspecified: Secondary | ICD-10-CM | POA: Diagnosis not present

## 2021-02-15 DIAGNOSIS — D631 Anemia in chronic kidney disease: Secondary | ICD-10-CM | POA: Diagnosis present

## 2021-02-15 DIAGNOSIS — Z888 Allergy status to other drugs, medicaments and biological substances status: Secondary | ICD-10-CM | POA: Diagnosis not present

## 2021-02-15 DIAGNOSIS — D509 Iron deficiency anemia, unspecified: Secondary | ICD-10-CM | POA: Diagnosis present

## 2021-02-15 DIAGNOSIS — Z87891 Personal history of nicotine dependence: Secondary | ICD-10-CM | POA: Diagnosis not present

## 2021-02-15 DIAGNOSIS — Z95810 Presence of automatic (implantable) cardiac defibrillator: Secondary | ICD-10-CM

## 2021-02-15 DIAGNOSIS — I420 Dilated cardiomyopathy: Secondary | ICD-10-CM | POA: Diagnosis present

## 2021-02-15 DIAGNOSIS — Z880 Allergy status to penicillin: Secondary | ICD-10-CM

## 2021-02-15 DIAGNOSIS — J9621 Acute and chronic respiratory failure with hypoxia: Secondary | ICD-10-CM | POA: Diagnosis not present

## 2021-02-15 DIAGNOSIS — Z8249 Family history of ischemic heart disease and other diseases of the circulatory system: Secondary | ICD-10-CM

## 2021-02-15 DIAGNOSIS — R531 Weakness: Secondary | ICD-10-CM | POA: Diagnosis not present

## 2021-02-15 DIAGNOSIS — R0602 Shortness of breath: Secondary | ICD-10-CM | POA: Diagnosis not present

## 2021-02-15 DIAGNOSIS — I5022 Chronic systolic (congestive) heart failure: Secondary | ICD-10-CM | POA: Diagnosis not present

## 2021-02-15 DIAGNOSIS — J9611 Chronic respiratory failure with hypoxia: Secondary | ICD-10-CM

## 2021-02-15 LAB — CBC
HCT: 34.9 % — ABNORMAL LOW (ref 36.0–46.0)
Hemoglobin: 9.8 g/dL — ABNORMAL LOW (ref 12.0–15.0)
MCH: 25.7 pg — ABNORMAL LOW (ref 26.0–34.0)
MCHC: 28.1 g/dL — ABNORMAL LOW (ref 30.0–36.0)
MCV: 91.6 fL (ref 80.0–100.0)
Platelets: 154 10*3/uL (ref 150–400)
RBC: 3.81 MIL/uL — ABNORMAL LOW (ref 3.87–5.11)
RDW: 21.7 % — ABNORMAL HIGH (ref 11.5–15.5)
WBC: 4.2 10*3/uL (ref 4.0–10.5)
nRBC: 2.4 % — ABNORMAL HIGH (ref 0.0–0.2)

## 2021-02-15 LAB — TROPONIN I (HIGH SENSITIVITY)
Troponin I (High Sensitivity): 99 ng/L — ABNORMAL HIGH (ref <18)
Troponin I (High Sensitivity): 120 ng/L (ref <18)
Troponin I (High Sensitivity): 123 ng/L (ref <18)
Troponin I (High Sensitivity): 130 ng/L (ref <18)

## 2021-02-15 LAB — RESP PANEL BY RT-PCR (FLU A&B, COVID) ARPGX2
Influenza A by PCR: NEGATIVE
Influenza B by PCR: NEGATIVE
SARS Coronavirus 2 by RT PCR: NEGATIVE

## 2021-02-15 LAB — HEPATIC FUNCTION PANEL
ALT: 21 U/L (ref 0–44)
AST: 25 U/L (ref 15–41)
Albumin: 3 g/dL — ABNORMAL LOW (ref 3.5–5.0)
Alkaline Phosphatase: 91 U/L (ref 38–126)
Bilirubin, Direct: 0.8 mg/dL — ABNORMAL HIGH (ref 0.0–0.2)
Indirect Bilirubin: 1.1 mg/dL — ABNORMAL HIGH (ref 0.3–0.9)
Total Bilirubin: 1.9 mg/dL — ABNORMAL HIGH (ref 0.3–1.2)
Total Protein: 6.6 g/dL (ref 6.5–8.1)

## 2021-02-15 LAB — BASIC METABOLIC PANEL
Anion gap: 11 (ref 5–15)
BUN: 66 mg/dL — ABNORMAL HIGH (ref 8–23)
CO2: 29 mmol/L (ref 22–32)
Calcium: 9 mg/dL (ref 8.9–10.3)
Chloride: 96 mmol/L — ABNORMAL LOW (ref 98–111)
Creatinine, Ser: 2.19 mg/dL — ABNORMAL HIGH (ref 0.44–1.00)
GFR, Estimated: 24 mL/min — ABNORMAL LOW (ref >60)
Glucose, Bld: 94 mg/dL (ref 70–99)
Potassium: 4.1 mmol/L (ref 3.5–5.1)
Sodium: 136 mmol/L (ref 135–145)

## 2021-02-15 LAB — DIGOXIN LEVEL: Digoxin Level: 2 ng/mL (ref 0.8–2.0)

## 2021-02-15 LAB — BRAIN NATRIURETIC PEPTIDE: B Natriuretic Peptide: 3624.6 pg/mL — ABNORMAL HIGH (ref 0.0–100.0)

## 2021-02-15 MED ORDER — SALINE SPRAY 0.65 % NA SOLN
1.0000 | NASAL | Status: DC | PRN
  Filled 2021-02-15 (×2): qty 44

## 2021-02-15 MED ORDER — CALCITRIOL 0.25 MCG PO CAPS
0.2500 ug | ORAL_CAPSULE | Freq: Every day | ORAL | Status: DC
  Administered 2021-02-15 – 2021-02-19 (×5): 0.25 ug via ORAL
  Filled 2021-02-15 (×6): qty 1

## 2021-02-15 MED ORDER — ALBUTEROL SULFATE (2.5 MG/3ML) 0.083% IN NEBU: 3.0000 mL | INHALATION_SOLUTION | Freq: Four times a day (QID) | RESPIRATORY_TRACT | Status: DC | PRN

## 2021-02-15 MED ORDER — ONDANSETRON HCL 4 MG/2ML IJ SOLN: 4.0000 mg | Freq: Four times a day (QID) | INTRAMUSCULAR | Status: DC | PRN

## 2021-02-15 MED ORDER — PANTOPRAZOLE SODIUM 40 MG PO TBEC
40.0000 mg | DELAYED_RELEASE_TABLET | Freq: Every day | ORAL | Status: DC
  Administered 2021-02-15 – 2021-02-19 (×5): 40 mg via ORAL
  Filled 2021-02-15 (×5): qty 1

## 2021-02-15 MED ORDER — MIDODRINE HCL 5 MG PO TABS
10.0000 mg | ORAL_TABLET | Freq: Three times a day (TID) | ORAL | Status: DC
  Administered 2021-02-15 – 2021-02-19 (×11): 10 mg via ORAL
  Filled 2021-02-15 (×11): qty 2

## 2021-02-15 MED ORDER — LEVOTHYROXINE SODIUM 50 MCG PO TABS
175.0000 ug | ORAL_TABLET | Freq: Every day | ORAL | Status: DC
  Administered 2021-02-16 – 2021-02-19 (×4): 175 ug via ORAL
  Filled 2021-02-15 (×4): qty 1

## 2021-02-15 MED ORDER — GABAPENTIN 100 MG PO CAPS
100.0000 mg | ORAL_CAPSULE | Freq: Every day | ORAL | Status: DC
  Administered 2021-02-15 – 2021-02-18 (×4): 100 mg via ORAL
  Filled 2021-02-15 (×4): qty 1

## 2021-02-15 MED ORDER — CARVEDILOL 6.25 MG PO TABS
3.1250 mg | ORAL_TABLET | Freq: Two times a day (BID) | ORAL | Status: DC
  Administered 2021-02-15 – 2021-02-19 (×8): 3.125 mg via ORAL
  Filled 2021-02-15 (×8): qty 1

## 2021-02-15 MED ORDER — FUROSEMIDE 10 MG/ML IJ SOLN: 80.0000 mg | Freq: Once | INTRAMUSCULAR | Status: DC

## 2021-02-15 MED ORDER — HEPARIN SODIUM (PORCINE) 5000 UNIT/ML IJ SOLN
5000.0000 [IU] | Freq: Three times a day (TID) | INTRAMUSCULAR | Status: DC
  Administered 2021-02-15 – 2021-02-19 (×11): 5000 [IU] via SUBCUTANEOUS
  Filled 2021-02-15 (×11): qty 1

## 2021-02-15 MED ORDER — PREDNISONE 10 MG PO TABS: 10.0000 mg | ORAL_TABLET | Freq: Every day | ORAL | Status: DC

## 2021-02-15 MED ORDER — SUCRALFATE 1 G PO TABS
1.0000 g | ORAL_TABLET | Freq: Four times a day (QID) | ORAL | Status: DC
  Administered 2021-02-15 – 2021-02-19 (×14): 1 g via ORAL
  Filled 2021-02-15 (×14): qty 1

## 2021-02-15 MED ORDER — HYDROCODONE-ACETAMINOPHEN 5-325 MG PO TABS: 1.0000 | ORAL_TABLET | ORAL | Status: DC | PRN

## 2021-02-15 MED ORDER — SPIRONOLACTONE 25 MG PO TABS
12.5000 mg | ORAL_TABLET | Freq: Every day | ORAL | Status: DC
  Administered 2021-02-15 – 2021-02-19 (×5): 12.5 mg via ORAL
  Filled 2021-02-15: qty 0.5
  Filled 2021-02-15: qty 1
  Filled 2021-02-15 (×2): qty 0.5
  Filled 2021-02-15: qty 1
  Filled 2021-02-15: qty 0.5
  Filled 2021-02-15: qty 1
  Filled 2021-02-15: qty 0.5
  Filled 2021-02-15 (×2): qty 1

## 2021-02-15 MED ORDER — CARVEDILOL 6.25 MG PO TABS: 6.2500 mg | ORAL_TABLET | Freq: Two times a day (BID) | ORAL | Status: DC

## 2021-02-15 MED ORDER — DIGOXIN 125 MCG PO TABS: 125.0000 ug | ORAL_TABLET | Freq: Every day | ORAL | Status: DC

## 2021-02-15 MED ORDER — FUROSEMIDE 10 MG/ML IJ SOLN
40.0000 mg | Freq: Once | INTRAMUSCULAR | Status: AC
  Administered 2021-02-15: 40 mg via INTRAVENOUS
  Filled 2021-02-15: qty 4

## 2021-02-15 MED ORDER — ACETAMINOPHEN 650 MG RE SUPP: 650.0000 mg | Freq: Four times a day (QID) | RECTAL | Status: DC | PRN

## 2021-02-15 MED ORDER — FLUTICASONE PROPIONATE 50 MCG/ACT NA SUSP
2.0000 | Freq: Every day | NASAL | Status: DC
  Administered 2021-02-16 – 2021-02-19 (×4): 2 via NASAL
  Filled 2021-02-15: qty 16

## 2021-02-15 MED ORDER — FLUTICASONE FUROATE-VILANTEROL 100-25 MCG/ACT IN AEPB
1.0000 | INHALATION_SPRAY | Freq: Every day | RESPIRATORY_TRACT | Status: DC
  Administered 2021-02-16 – 2021-02-19 (×3): 1 via RESPIRATORY_TRACT
  Filled 2021-02-15: qty 28

## 2021-02-15 MED ORDER — ACETAMINOPHEN 325 MG PO TABS: 650.0000 mg | ORAL_TABLET | Freq: Four times a day (QID) | ORAL | Status: DC | PRN

## 2021-02-15 MED ORDER — POLYSACCHARIDE IRON COMPLEX 150 MG PO CAPS
150.0000 mg | ORAL_CAPSULE | Freq: Every day | ORAL | Status: DC
  Administered 2021-02-15 – 2021-02-19 (×5): 150 mg via ORAL
  Filled 2021-02-15 (×6): qty 1

## 2021-02-15 MED ORDER — MORPHINE SULFATE (PF) 2 MG/ML IV SOLN: 2.0000 mg | INTRAVENOUS | Status: DC | PRN

## 2021-02-15 MED ORDER — ONDANSETRON HCL 4 MG PO TABS: 4.0000 mg | ORAL_TABLET | Freq: Four times a day (QID) | ORAL | Status: DC | PRN

## 2021-02-15 MED ORDER — UMECLIDINIUM BROMIDE 62.5 MCG/ACT IN AEPB
1.0000 | INHALATION_SPRAY | Freq: Every day | RESPIRATORY_TRACT | Status: DC
  Administered 2021-02-16 – 2021-02-19 (×3): 1 via RESPIRATORY_TRACT
  Filled 2021-02-15: qty 7

## 2021-02-15 MED ORDER — VITAMIN B-12 1000 MCG PO TABS
1000.0000 ug | ORAL_TABLET | Freq: Every day | ORAL | Status: DC
  Administered 2021-02-15 – 2021-02-19 (×5): 1000 ug via ORAL
  Filled 2021-02-15 (×6): qty 1

## 2021-02-15 MED ORDER — MIDODRINE HCL 5 MG PO TABS
5.0000 mg | ORAL_TABLET | Freq: Once | ORAL | Status: AC
  Administered 2021-02-15: 5 mg via ORAL
  Filled 2021-02-15: qty 1

## 2021-02-15 MED ORDER — FUROSEMIDE 10 MG/ML IJ SOLN
40.0000 mg | Freq: Two times a day (BID) | INTRAMUSCULAR | Status: DC
  Administered 2021-02-15 – 2021-02-19 (×8): 40 mg via INTRAVENOUS
  Filled 2021-02-15 (×8): qty 4

## 2021-02-15 MED ORDER — AMIODARONE HCL 200 MG PO TABS
100.0000 mg | ORAL_TABLET | Freq: Every day | ORAL | Status: DC
  Administered 2021-02-16 – 2021-02-19 (×4): 100 mg via ORAL
  Filled 2021-02-15 (×4): qty 1

## 2021-02-15 NOTE — ED Notes (Signed)
Lab at bedside to collect repeat trop

## 2021-02-15 NOTE — ED Provider Notes (Signed)
[facility] Provider Note    Event Date/Time   First MD Initiated Contact with Patient 02/15/21 1023     (approximate)   History   Weakness, Leg Swelling, and Shortness of Breath   HPI  Maria Neal is a 71 y.o. female with CHF with AICD, COPD on 3 L, chronic A-fib not on anticoagulation who comes in with concern for weakness, shortness of breath and leg swelling.  Per report the patient had off-and-on swelling and has been compliant with her torsemide that she takes 1 time daily however she has been experiencing increasing weakness and shortness of breath therefore their doctor told her to come to the ER to be evaluated.  She denies any falls or hitting her head but did have a fall about a week ago but had followed up with their primary doctor and there was no evidence of injuries.  They do state that the blueness on her lips are baseline for her but her fingernails do look a little bit darker than previous.  No abdominal pain.   Physical Exam   Triage Vital Signs: ED Triage Vitals  Enc Vitals Group     BP 02/15/21 1013 (!) 106/54     Pulse Rate 02/15/21 1013 73     Resp 02/15/21 1013 20     Temp --      Temp src --      SpO2 02/15/21 1013 93 %     Weight 02/15/21 1014 161 lb (73 kg)     Height 02/15/21 1014 5' 5.75" (1.67 m)     Head Circumference --      Peak Flow --      Pain Score 02/15/21 1014 0     Pain Loc --      Pain Edu? --      Excl. in [place]? --     Most recent vital signs: Vitals:   02/15/21 1013  BP: (!) 106/54  Pulse: 73  Resp: 20  SpO2: 93%     General: Awake, no distress.  CV:  Good peripheral perfusion.  Resp:  Normal effort.  Abd:  No distention.  Other:  Blue-tinged lips which is baseline per family with blue-tinged nailbeds. 1+ edema bilateral legs.     ED Results / Procedures / Treatments   Labs (all labs ordered are listed, but only abnormal results are displayed) Labs Reviewed  RESP PANEL BY RT-PCR (FLU  A&B, COVID) ARPGX2  BASIC METABOLIC PANEL  CBC  HEPATIC FUNCTION PANEL  BRAIN NATRIURETIC PEPTIDE  DIGOXIN LEVEL  TROPONIN I (HIGH SENSITIVITY)     EKG  My interpretation of EKG:  Ventricularly paced rhythm at a rate of 73 with a little bit of ST elevation in V2 aVL and lead I but patient does have left bundle branch block I reviewed her prior EKG and it looks pretty similar to prior.  RADIOLOGY I have reviewed the xray personally and agree with radiology read cardiomegaly with pulmonary vascular congestion   PROCEDURES:  Critical Care performed: Yes, see critical care procedure note(s)  .Critical Care Performed by: Vanessa Freeport, MD Authorized by: Vanessa Brewer, MD   Critical care provider statement:    Critical care time (minutes):  30   Critical care was necessary to treat or prevent imminent or life-threatening deterioration of the following conditions: CHF on oxygen needing lasix.   Critical care was time spent personally by me on the following activities:  Development of treatment  plan with patient or surrogate, discussions with consultants, evaluation of patient's response to treatment, examination of patient, ordering and review of laboratory studies, ordering and review of radiographic studies, ordering and performing treatments and interventions, pulse oximetry, re-evaluation of patient's condition and review of old charts .1-3 Lead EKG Interpretation Performed by: Vanessa Coahoma, MD Authorized by: Vanessa Dennehotso, MD     Interpretation: normal     ECG rate:  70   ECG rate assessment: normal     Rhythm: sinus rhythm     Ectopy: none     MEDICATIONS ORDERED IN ED: Medications  furosemide (LASIX) injection 40 mg (has no administration in time range)  heparin injection 5,000 Units (has no administration in time range)  acetaminophen (TYLENOL) tablet 650 mg (has no administration in time range)    Or  acetaminophen (TYLENOL) suppository 650 mg (has no  administration in time range)  HYDROcodone-acetaminophen (NORCO/VICODIN) 5-325 MG per tablet 1-2 tablet (has no administration in time range)  morphine (PF) 2 MG/ML injection 2 mg (has no administration in time range)  ondansetron (ZOFRAN) tablet 4 mg (has no administration in time range)    Or  ondansetron (ZOFRAN) injection 4 mg (has no administration in time range)  midodrine (PROAMATINE) tablet 10 mg (has no administration in time range)  albuterol (PROVENTIL) (2.5 MG/3ML) 0.083% nebulizer solution 3 mL (has no administration in time range)  amiodarone (PACERONE) tablet 100 mg (has no administration in time range)  calcitRIOL (ROCALTROL) capsule 0.25 mcg (has no administration in time range)  fluticasone (FLONASE) 50 MCG/ACT nasal spray 2 spray (has no administration in time range)  fluticasone furoate-vilanterol (BREO ELLIPTA) 100-25 MCG/ACT 1 puff (has no administration in time range)    And  umeclidinium bromide (INCRUSE ELLIPTA) 62.5 MCG/ACT 1 puff (has no administration in time range)  gabapentin (NEURONTIN) capsule 100 mg (has no administration in time range)  iron polysaccharides (NIFEREX) capsule 150 mg (has no administration in time range)  levothyroxine (SYNTHROID) tablet 175 mcg (has no administration in time range)  pantoprazole (PROTONIX) EC tablet 40 mg (has no administration in time range)  vitamin B-12 (CYANOCOBALAMIN) tablet 1,000 mcg (has no administration in time range)  sucralfate (CARAFATE) tablet 1 g (1 g Oral Not Given 02/15/21 1655)  spironolactone (ALDACTONE) tablet 12.5 mg (has no administration in time range)  carvedilol (COREG) tablet 3.125 mg (has no administration in time range)  midodrine (PROAMATINE) tablet 5 mg (5 mg Oral Given 02/15/21 1319)  furosemide (LASIX) injection 40 mg (40 mg Intravenous Given 02/15/21 1322)     IMPRESSION / MDM / ASSESSMENT AND PLAN / ED COURSE  I reviewed the triage vital signs and the nursing notes.  Pt with CHF and ICD  coming in with SOB Differential diagnosis includes, but is not limited to CHF flare, covid, flu, less likely PE given exam seems consistent with PE.  Consider ACS as well.  Initial trop + could be from demand and pt has CKD. Will need to trend out.  Cr around baseline  BNP elevated.   BP slightly low but missed midodrine. D/w pt coming in for careful diuresis given low BP and difficult to manage at home.  The patient is on the cardiac monitor to evaluate for evidence of arrhythmia and/or significant heart rate changes.  FINAL CLINICAL IMPRESSION(S) / ED DIAGNOSES   Final diagnoses:  Acute on chronic congestive heart failure, unspecified heart failure type (HCC)  Chronic respiratory failure with hypoxia (HCC)  Rx / DC Orders   ED Discharge Orders     None        Note:  This document was prepared using Dragon voice recognition software and may include unintentional dictation errors.   Vanessa Mantoloking, MD 02/15/21 [phone]

## 2021-02-15 NOTE — ED Notes (Signed)
Dr. Rowe Pavy aware of increased trop

## 2021-02-15 NOTE — ED Notes (Addendum)
Trop 99, MD Funke aware

## 2021-02-15 NOTE — Telephone Encounter (Signed)
Reason for Disposition  [1] Can't walk or can barely walk AND [2] new-onset  Answer Assessment - Initial Assessment Questions 1. ONSET: "When did the swelling start?" (e.g., minutes, hours, days)     Chronic- using fluid pills 2. LOCATION: "What part of the leg is swollen?"  "Are both legs swollen or just one leg?"     Bilateral- more L 3. SEVERITY: "How bad is the swelling?" (e.g., localized; mild, moderate, severe)  - Localized - small area of swelling localized to one leg  - MILD pedal edema - swelling limited to foot and ankle, pitting edema < 1/4 inch (6 mm) deep, rest and elevation eliminate most or all swelling  - MODERATE edema - swelling of lower leg to knee, pitting edema > 1/4 inch (6 mm) deep, rest and elevation only partially reduce swelling  - SEVERE edema - swelling extends above knee, facial or hand swelling present      severe 4. REDNESS: "Does the swelling look red or infected?"     In feet 5. PAIN: "Is the swelling painful to touch?" If Yes, ask: "How painful is it?"   (Scale 1-10; mild, moderate or severe)     Severe- unable to walk 6. FEVER: "Do you have a fever?" If Yes, ask: "What is it, how was it measured, and when did it start?"      *No Answer* 7. CAUSE: "What do you think is causing the leg swelling?"     *No Answer* 8. MEDICAL HISTORY: "Do you have a history of heart failure, kidney disease, liver failure, or cancer?"     *No Answer* 9. RECURRENT SYMPTOM: "Have you had leg swelling before?" If Yes, ask: "When was the last time?" "What happened that time?"     *No Answer* 10. OTHER SYMPTOMS: "Do you have any other symptoms?" (e.g., chest pain, difficulty breathing)       SOB 11. PREGNANCY: "Is there any chance you are pregnant?" "When was your last menstrual period?"       *No Answer*  Protocols used: Leg Swelling and Edema-A-AH

## 2021-02-15 NOTE — Telephone Encounter (Signed)
°  Chief Complaint: leg edema Symptoms: severe edema-pain, unable to walk,SOB Frequency: chronic- worse Pertinent Negatives: Patient denies   Disposition: [x] ED /[] Urgent Care (no appt availability in office) / [] Appointment(In office/virtual)/ []  [place] [facility]/ [] Home Care/ [] Refused Recommended Disposition /[] [place] [facility]/ []  Follow-up with PCP Additional Notes:

## 2021-02-15 NOTE — ED Triage Notes (Signed)
Pt to ED via POV with c/o SHOB, leg swelling and weakness. Pts lips are cyanotic per family that is normal when she is cold. Pt is chronically on 3L  and does have hx of CHF.

## 2021-02-15 NOTE — H&P (Addendum)
H&P:    Maria Neal   WUJ:[id number] DOB: Jul 15, 1950 DOA: 02/15/2021  PCP: Jon Billings, NP  Chief Complaint: shortness of breath, lower extremity swelling   History of Present Illness:    HPI: Maria Neal is a 71 y.o. female with a past medical history of congestive heart failure with reduced EF of 20-25% based off ECHO on 8/22 status post AICD, chronic atrial fibrillation not on chronic anticoagulation due to history of GI bleed, COPD with chronic hypoxic respiratory failure on 3L oxygen at baseline, hypothyroidism, GERD, chronic hypotension on midodrine.  This patients presents to the ER from home with progressing shortness of breath and worsening lower extremity edema. She has gained 6lbs over the past 5 days.  No family present at bedside.  Denies any chest pain.  Denies any nausea, vomiting, diarrhea.  She is currently on 4 L and saturating well.  Did drop to the mid 80s via pulse ox once she ambulated to the bedside commode.  Not in any respiratory distress.  PureWick in place to monitor urine output closely.  ED Course: BNP elevated at 3600.  Dig level unremarkable.  Elevated high-sensitivity troponin 99.  Labs show her renal function is at her baseline.  Chest x-ray shows vascular congestion with a marked cardiomegaly and no significant pulmonary edema.  She has been given Lasix 40 mg IV x1.  Also given her home midodrine dose of 5 mg as she did not take it this morning.  COVID-19 PCR negative.   ROS:   14 point review of systems is negative except for what is mentioned above in the HPI.   Past Medical History:   Past Medical History:  Diagnosis Date   Acute appendicitis 02/17/2018   AICD (automatic cardioverter/defibrillator) present    Anxiety state    Arrhythmia    atrial fibrillation   CAD (coronary artery disease)    Cardiomyopathy (HCC)    CHF (congestive heart failure) (HCC)    COPD (chronic obstructive pulmonary disease) ([place])    Dyspnea    Headache     History of kidney stones    History of shingles    Hypertension    Influenza A 11/04/2020   On home oxygen therapy    bedtime and prn   Restless leg syndrome     Past Surgical History:   Past Surgical History:  Procedure Laterality Date   CARDIAC CATHETERIZATION  04/2014   Dr. Idelle Leech   CARDIAC CATHETERIZATION     CARDIAC DEFIBRILLATOR PLACEMENT     CARDIOVERSION N/A 08/11/2020   Procedure: CARDIOVERSION;  Surgeon: Dixie Dials, MD;  Location: [place];  Service: Cardiovascular;  Laterality: N/A;   CHOLECYSTECTOMY N/A 08/23/2016   Procedure: LAPAROSCOPIC CHOLECYSTECTOMY;  Surgeon: Clayburn Pert, MD;  Location: [facility];  Service: General;  Laterality: N/A;   COLONOSCOPY WITH PROPOFOL N/A 12/15/2020   Procedure: COLONOSCOPY WITH PROPOFOL;  Surgeon: Lin Landsman, MD;  Location: [place];  Service: Gastroenterology;  Laterality: N/A;   COLONOSCOPY WITH PROPOFOL N/A 12/16/2020   Procedure: COLONOSCOPY WITH PROPOFOL;  Surgeon: Lucilla Lame, MD;  Location[facility] ENDOSCOPY;  Service: Endoscopy;  Laterality: N/A;   DILATATION & CURETTAGE/HYSTEROSCOPY WITH MYOSURE N/A 06/27/2018   Procedure: DILATATION & CURETTAGE/HYSTEROSCOPY WITH MYOSURE;  Surgeon: Malachy Mood, MD;  Location: [facility];  Service: Gynecology;  Laterality: N/A;   ESOPHAGOGASTRODUODENOSCOPY N/A 12/15/2020   Procedure: ESOPHAGOGASTRODUODENOSCOPY (EGD);  Surgeon: Lin Landsman, MD;  Location: [facility]OSCOPY;  Service: Gastroenterology;  Laterality: N/A;  HERNIA REPAIR     LAPAROSCOPIC APPENDECTOMY N/A 02/17/2018   Procedure: APPENDECTOMY LAPAROSCOPIC;  Surgeon: Benjamine Sprague, DO;  Location: [facility];  Service: General;  Laterality: N/A;   PPM GENERATOR CHANGEOUT N/A 09/14/2020   Procedure: PPM GENERATOR CHANGEOUT;  Surgeon: Isaias Cowman, MD;  Location: [place] [facility];  Service: Cardiovascular;  Laterality: N/A;   TEE WITHOUT CARDIOVERSION N/A 08/10/2020   Procedure:  TRANSESOPHAGEAL ECHOCARDIOGRAM (TEE);  Surgeon: Dixie Dials, MD;  Location: [facility] ENDOSCOPY;  Service: Cardiovascular;  Laterality: N/A;    Social History:   Social History   Socioeconomic History   Marital status: Divorced    Spouse name: Not on file   Number of children: Not on file   Years of education: Not on file   Highest education level: High school graduate  Occupational History   Occupation: retired  Tobacco Use   Smoking status: Former    Packs/day: 0.25    Types: Cigarettes   Smokeless tobacco: Never   Tobacco comments:    quit at age 52, whole pack lasted 3 weeks   Vaping Use   Vaping Use: Never used  Substance and Sexual Activity   Alcohol use: No    Alcohol/week: 0.0 standard drinks   Drug use: No   Sexual activity: Not Currently    Birth control/protection: None  Other Topics Concern   Not on file  Social History Narrative   Not on file   Social Determinants of Health   Financial Resource Strain: Not on file  Food Insecurity: Not on file  Transportation Needs: Not on file  Physical Activity: Not on file  Stress: Not on file  Social Connections: Not on file  Intimate Partner Violence: Not on file    Allergies:   Allergies  Allergen Reactions   Levaquin [Levofloxacin] Anaphylaxis   Amoxicillin Hives    Did it involve swelling of the face/tongue/throat, SOB, or low BP? No Did it involve sudden or severe rash/hives, skin peeling, or any reaction on the inside of your mouth or nose? No Did you need to seek medical attention at a hospital or doctor's office? No When did it last happen?      10+ years If all above answers are "NO", may proceed with cephalosporin use.   Nitrofuran Derivatives Other (See Comments)    Unknown   Penicillins Other (See Comments)    Thinks it made her itch a lot, but isn't sure. Did it involve swelling of the face/tongue/throat, SOB, or low BP? No Did it involve sudden or severe rash/hives, skin peeling, or any reaction  on the inside of your mouth or nose? No Did you need to seek medical attention at a hospital or doctor's office? No When did it last happen?      10+ years If all above answers are "NO", may proceed with cephalosporin use.    Zithromax [Azithromycin] Other (See Comments)   Antihistamines, Chlorpheniramine-Type Rash    Family History:   Family History  Problem Relation Age of Onset   Diabetes Mellitus II Mother    Hypertension Mother    Heart attack Father    Arthritis Sister    Arthritis Sister    Thyroid disease Brother    Heart disease Brother    Diabetes Brother    Depression Daughter    Depression Son    Schizophrenia Son    Breast cancer Neg Hx      Current Medications:   Prior to Admission medications   Medication  Sig Start Date End Date Taking? Authorizing Provider  albuterol (VENTOLIN HFA) 108 (90 Base) MCG/ACT inhaler Inhale 2 puffs into the lungs every 6 (six) hours as needed for wheezing or shortness of breath. 01/18/21   Jon Billings, NP  alum & mag hydroxide-simeth (MAALOX/MYLANTA) 200-200-20 MG/5ML suspension Take 30 mLs by mouth every 4 (four) hours as needed for indigestion or heartburn. 08/13/20   Dixie Dials, MD  amiodarone (PACERONE) 200 MG tablet Take 1 tablet (200 mg total) by mouth daily. Patient taking differently: Take 100 mg by mouth daily. 08/13/20   Dixie Dials, MD  calcitRIOL (ROCALTROL) 0.25 MCG capsule Take 0.25 mcg by mouth daily. 11/12/20   [provider]  carvedilol (COREG) 6.25 MG tablet Take 6.25 mg by mouth 2 (two) times daily. 12/06/20   [provider]  digoxin (LANOXIN) 0.125 MG tablet Take 125 mcg by mouth daily. 01/04/21   [provider]  fluticasone (FLONASE) 50 MCG/ACT nasal spray Place 2 sprays into both nostrils daily. 07/26/18   Volney American, PA-C  Fluticasone-Umeclidin-Vilant (TRELEGY ELLIPTA) 100-62.5-25 MCG/INH AEPB Inhale 1 puff into the lungs daily. 02/12/20   [provider]   gabapentin (NEURONTIN) 100 MG capsule Take 1 capsule (100 mg total) by mouth at bedtime. 08/13/20   Dixie Dials, MD  iron polysaccharides (NIFEREX) 150 MG capsule Take 1 capsule (150 mg total) by mouth daily. 12/16/20   Fritzi Mandes, MD  levothyroxine (SYNTHROID) 175 MCG tablet Take 1 tablet (175 mcg total) by mouth daily. 12/10/20   Renato Shin, MD  midodrine (PROAMATINE) 5 MG tablet TAKE 1 TABLET BY MOUTH 3 TIMES DAILY WITH MEALS 01/27/21   Jon Billings, NP  nystatin cream (MYCOSTATIN) Apply 1 application topically 2 (two) times daily. Patient not taking: Reported on 01/18/2021 09/23/20   Jon Billings, NP  OXYGEN Inhale 3 L into the lungs 3 (three) times daily as needed (shortness of breath or coughing).    [provider]  pantoprazole (PROTONIX) 40 MG tablet Take 1 tablet (40 mg total) by mouth daily. 01/18/21 01/18/22  Jon Billings, NP  predniSONE (DELTASONE) 10 MG tablet Take 1 tablet (10 mg total) by mouth daily with breakfast. Take 6 today, 5 tomorrow, and decrease by 1 each day until course is complete. Patient not taking: Reported on 02/15/2021 02/01/21   Jon Billings, NP  spironolactone (ALDACTONE) 25 MG tablet Take 12.5 mg by mouth daily. 04/09/17   [provider]  sucralfate (CARAFATE) 1 g tablet Take 1 tablet (1 g total) by mouth 4 (four) times daily. 01/10/21   Nance Pear, MD  torsemide (DEMADEX) 20 MG tablet Take 20 mg by mouth daily.    [provider]  triamcinolone (KENALOG) 0.025 % ointment Apply 1 application topically 2 (two) times daily. Patient not taking: Reported on 01/31/2021 12/13/20   Jon Billings, NP  vitamin B-12 1000 MCG tablet Take 1 tablet (1,000 mcg total) by mouth daily. 12/16/20   Fritzi Mandes, MD     Physical Exam:   Vitals:   02/15/21 1013 02/15/21 1014 02/15/21 1117 02/15/21 1130  BP: (!) 106/54   (!) 112/54  Pulse: 73  69 73  Resp: 20   18  Temp:   97.9 F (36.6 C)   TempSrc:   Axillary   SpO2: 93%   95% 96%  Weight:  73 kg    Height:  5' 5.75" (1.67 m)       General:  Appears calm and comfortable and is in NAD Cardiovascular:  Irregular rhythm with regular rate Respiratory:   Scattered crackles.  Normal respiratory effort. Abdomen:  soft, NT, ND, NABS Skin: Cyanosis of the lips and nailbeds on her hands Musculoskeletal:  grossly normal tone BUE/BLE, good ROM, no bony abnormality Lower extremity: 2+ pitting edema bilateral lower extremities.  Limited foot exam with no ulcerations.  2+ distal pulses. Psychiatric:  grossly normal mood and affect, speech fluent and appropriate, AOx3 Neurologic:  CN 2-12 grossly intact, moves all extremities in coordinated fashion, sensation intact    Data Review:    Radiological Exams on Admission: Independently reviewed - see discussion in A/P where applicable  DG Chest Portable 1 View  Result Date: 02/15/2021 CLINICAL DATA:  Short of breath and leg swelling and weakness EXAM: PORTABLE CHEST 1 VIEW COMPARISON:  01/10/2021 FINDINGS: Cardiac enlargement. AICD unchanged in position. Mild vascular congestion. Negative for edema or effusion. Mild bibasilar atelectasis. IMPRESSION: Mild pulmonary vascular congestion without edema. Mild bibasilar atelectasis. Marked cardiomegaly. Electronically Signed   By: Franchot Gallo M.D.   On: 02/15/2021 10:42    EKG: Ventricular paced rhythm    Labs on Admission: I have personally reviewed the available labs and imaging studies at the time of the admission.  Pertinent labs on Admission: BNP 3624, initial troponin 99, hemoglobin 8, hematocrit 34.9, creatinine 2.19, BUN 66, T. bili 1.9     Assessment/Plan:    Acute on chronic CHF exacerbation: Reduced ejection fraction 20 to 25% based off echo on 08/2020. Status post AICD.  She has biventricular heart failure.  2+ pitting edema bilateral lower extremities.  Given Lasix 40 mg IV x1 in the emergency department.  Hold home torsemide.  Start scheduled IV diuresis  with Lasix 40 twice daily.  Monitor renal markers closely.  Consult cardiology.  Obtain echocardiogram. May need inotropic support with dobutamine drip.  Daily weights, strict I's and O's, fluid restriction, salt restriction.  Monitor urine output.  Continue home Aldactone and Coreg.  Acute on chronic hypoxic respiratory failure: On 4L currently.  She is on 3 L at baseline.  Plan as above  Elevated high-sensitivity troponins: Initial troponin 99.  Denies any chest pain.  Likely secondary to demand ischemia.  Doubt ACS. Cycle cardiac enzymes until flattened.  Chronic hypotension: On midodrine 5 mg 3 times daily home.  We will increase this to 10 mg 3 times daily.  Chronic kidney disease stage IV: Appears at baseline.  Monitor closely. Baseline creatinine appears to be around 1.7-2.1.  Continue home Calcitrol  Chronic atrial fibrillation: Not on chronic anticoagulation.  Continue home digoxin, amiodarone and Coreg.  COPD: Not in acute exacerbation.  Continue home Trelegy inhaler and Ventolin HFA inhaler as needed.   Peripheral neuropathy: Continue home gabapentin  Anemia of chronic renal disease: Continue home Niferex  GERD: Continue home Protonix and Carafate  Hypothyroidism: Continue home levothyroxine   Other information:    Level of Care: Progressive care DVT prophylaxis: Heparin subcu Code Status: Full code Consults: Cardiology Admission status: Inpatient   Leslee Home DO [facility]   How to contact the [facility] Attending or Consulting provider [place] or covering provider during after hours [place], for this patient?  Check the care team in [facility] and look for a) attending/consulting [facility] provider listed and b) the [facility] team listed Log into [email or web address] and use [place]'s universal password to access. If you do not have the password, please contact the hospital operator. Locate the [facility] provider you are looking for under [facility] and page to a number that you  can be directly reached. If you still have difficulty reaching the provider, please page the [facility] (Director on Call) for the Hospitalists listed on amion for assistance.   02/15/2021, 1:15 PM

## 2021-02-15 NOTE — Progress Notes (Signed)
*  PRELIMINARY RESULTS* Echocardiogram 2D Echocardiogram has been performed.  Maria Neal 02/15/2021, 2:11 PM

## 2021-02-15 NOTE — Progress Notes (Signed)
Patient having difficulty voiding (straining) with purewick.  Tried using the [facility].  Patient was able to have a BM and urinate (roughly 200 ml).    We will need to continue to monitor urination amounts.  Uncertain how much UOP patient had while in ED, but 200 ml is all patient has put out since arriving on our unit.  Will do a bladder scan if patient continues to have issues.    Reported off to night nurse.

## 2021-02-15 NOTE — ED Notes (Signed)
Pt to bedside commode

## 2021-02-15 NOTE — ED Notes (Signed)
Pt presents to ED with c/o of increasing swelling and SOB. Pt states she wears 02 chronically at 3L/min. Pt states she had a 6 lb weight gain in 5 days. Pt states more swelling BLE. Pt is A&Ox4. Pt speaking in full sentences without issue. Daughter at bedside. BP low, pt states "this is normal I take meds for it". Pt states she takes midodrine TID but did not take it today. Pt also has purplish lips and fingertips, pt also reports this is normal. Pt feels warm to touch.

## 2021-02-15 NOTE — Progress Notes (Signed)
Pt voided minimal amount, incontinence. Bladder scan done- 54ml.

## 2021-02-15 NOTE — Consult Note (Addendum)
[place] NOTE       Patient ID: Maria Neal MRN: [id number] DOB/AGE: [birth year] 71 y.o.  Admit date: 02/15/2021 Referring Physician Dr. Imagene Sheller Primary Physician Jon Billings, NP Primary Cardiologist Dr. Saralyn Pilar Reason for Consultation CHF  HPI: The patient is a 71 year old female with a past medical history of nonischemic dilated cardiomyopathy, HFrEF (LVEF 25%) s/p CRT-D 11/2014 with change out 09/2020, paroxysmal atrial fibrillation s/p cardioversion 08/2020 not on anticoagulation due to history of GI bleeding, COPD on 3 L at baseline, CKD 4 who presented to [facility] ED the morning of 02/15/2021 with progressively worsening shortness of breath and lower extremity edema.  Cardiology is consulted for assistance with her heart failure.   She received 40 mg of IV Lasix in the ED with no output recorded in the chart.  There is no family at bedside during my time of interview this afternoon. The patient states she has been compliant with her medications recently but cannot tell me what she takes at home.  She states she presented to the ED due to worsening leg swelling that started yesterday.  Per chart review of nursing and hospitalist notes, the patient said she had a 6 pound weight gain in 5 days.  She told me her baseline weight is about 160 pounds, which she weighs today.  She denies worsening shortness of breath, chest pain, palpitations, dizziness.  Vitals are notable for a blood pressure of 109/50, heart rate 69, SPO2 95% on her baseline of 3 L by nasal cannula.  Labs on admission are notable for a creatinine of 2.19, GFR 24 (baseline from 1 month ago 1.7 and GFR 32), BNP significantly elevated to 3624.  Troponin 99-120.  H&H at baseline 9.8/34.9, platelets 154.  Chest x-ray with cardiac enlargement, mild pulmonary vascular congestion, mild bibasilar atelectasis.  Review of systems complete and found to be negative unless listed above     Past Medical  History:  Diagnosis Date   Acute appendicitis 02/17/2018   AICD (automatic cardioverter/defibrillator) present    Anxiety state    Arrhythmia    atrial fibrillation   CAD (coronary artery disease)    Cardiomyopathy (HCC)    CHF (congestive heart failure) (HCC)    COPD (chronic obstructive pulmonary disease) ([place])    Dyspnea    Headache    History of kidney stones    History of shingles    Hypertension    Influenza A 11/04/2020   On home oxygen therapy    bedtime and prn   Restless leg syndrome     Past Surgical History:  Procedure Laterality Date   CARDIAC CATHETERIZATION  04/2014   Dr. Idelle Leech   CARDIAC CATHETERIZATION     CARDIAC DEFIBRILLATOR PLACEMENT     CARDIOVERSION N/A 08/11/2020   Procedure: CARDIOVERSION;  Surgeon: Dixie Dials, MD;  Location: [place];  Service: Cardiovascular;  Laterality: N/A;   CHOLECYSTECTOMY N/A 08/23/2016   Procedure: LAPAROSCOPIC CHOLECYSTECTOMY;  Surgeon: Clayburn Pert, MD;  Location: [facility];  Service: General;  Laterality: N/A;   COLONOSCOPY WITH PROPOFOL N/A 12/15/2020   Procedure: COLONOSCOPY WITH PROPOFOL;  Surgeon: Lin Landsman, MD;  Location: [place];  Service: Gastroenterology;  Laterality: N/A;   COLONOSCOPY WITH PROPOFOL N/A 12/16/2020   Procedure: COLONOSCOPY WITH PROPOFOL;  Surgeon: Lucilla Lame, MD;  Location: [facility] ENDOSCOPY;  Service: Endoscopy;  Laterality: N/A;   DILATATION & CURETTAGE/HYSTEROSCOPY WITH MYOSURE N/A 06/27/2018   Procedure: DILATATION & CURETTAGE/HYSTEROSCOPY WITH MYOSURE;  Surgeon: Malachy Mood,  MD;  Location: [facility];  Service: Gynecology;  Laterality: N/A;   ESOPHAGOGASTRODUODENOSCOPY N/A 12/15/2020   Procedure: ESOPHAGOGASTRODUODENOSCOPY (EGD);  Surgeon: Lin Landsman, MD;  Location: [facility] ENDOSCOPY;  Service: Gastroenterology;  Laterality: N/A;   HERNIA REPAIR     LAPAROSCOPIC APPENDECTOMY N/A 02/17/2018   Procedure: APPENDECTOMY LAPAROSCOPIC;  Surgeon: Benjamine Sprague, DO;   Location: [facility];  Service: General;  Laterality: N/A;   PPM GENERATOR CHANGEOUT N/A 09/14/2020   Procedure: PPM GENERATOR CHANGEOUT;  Surgeon: Isaias Cowman, MD;  Location: [place] [facility];  Service: Cardiovascular;  Laterality: N/A;   TEE WITHOUT CARDIOVERSION N/A 08/10/2020   Procedure: TRANSESOPHAGEAL ECHOCARDIOGRAM (TEE);  Surgeon: Dixie Dials, MD;  Location: [facility] ENDOSCOPY;  Service: Cardiovascular;  Laterality: N/A;    (Not in a hospital admission)  Social History   Socioeconomic History   Marital status: Divorced    Spouse name: Not on file   Number of children: Not on file   Years of education: Not on file   Highest education level: High school graduate  Occupational History   Occupation: retired  Tobacco Use   Smoking status: Former    Packs/day: 0.25    Types: Cigarettes   Smokeless tobacco: Never   Tobacco comments:    quit at age 37, whole pack lasted 3 weeks   Vaping Use   Vaping Use: Never used  Substance and Sexual Activity   Alcohol use: No    Alcohol/week: 0.0 standard drinks   Drug use: No   Sexual activity: Not Currently    Birth control/protection: None  Other Topics Concern   Not on file  Social History Narrative   Not on file   Social Determinants of Health   Financial Resource Strain: Not on file  Food Insecurity: Not on file  Transportation Needs: Not on file  Physical Activity: Not on file  Stress: Not on file  Social Connections: Not on file  Intimate Partner Violence: Not on file    Family History  Problem Relation Age of Onset   Diabetes Mellitus II Mother    Hypertension Mother    Heart attack Father    Arthritis Sister    Arthritis Sister    Thyroid disease Brother    Heart disease Brother    Diabetes Brother    Depression Daughter    Depression Son    Schizophrenia Son    Breast cancer Neg Hx       Review of systems complete and found to be negative unless listed above    PHYSICAL EXAM General: Elderly  Caucasian female, well nourished, in no acute distress.  Sitting upright in ED stretcher. HEENT:  Normocephalic and atraumatic.  Cyanotic lips reportedly at baseline. Neck:  No JVD.  Lungs: Normal respiratory effort on 3 L by nasal cannula.  Poor air movement, bibasilar crackles.  Expiratory wheezes.   Heart: HRRR . Normal S1 and S2 without gallops or murmurs. Radial & DP pulses 2+ bilaterally. Abdomen: Non-distended appearing.  Msk: Normal strength and tone for age. Extremities: Warm and well perfused. No clubbing.  Mild cyanosis to fingertips bilaterally apparently not new for patient.  2+ pitting bilateral lower extremity edema up to patient's mid thigh.  Neuro: Alert and oriented X 3. Psych:  Answers questions appropriately.   Labs:   Lab Results  Component Value Date   WBC 4.2 02/15/2021   HGB 9.8 (L) 02/15/2021   HCT 34.9 (L) 02/15/2021   MCV 91.6 02/15/2021   PLT 154 02/15/2021  Recent Labs  Lab 02/15/21 1055 02/15/21 1056  NA 136  --   K 4.1  --   CL 96*  --   CO2 29  --   BUN 66*  --   CREATININE 2.19*  --   CALCIUM 9.0  --   PROT  --  6.6  BILITOT  --  1.9*  ALKPHOS  --  91  ALT  --  21  AST  --  25  GLUCOSE 94  --    Lab Results  Component Value Date   TROPONINI <0.03 09/02/2014    Lab Results  Component Value Date   CHOL 124 09/23/2020   CHOL 202 (H) 03/10/2020   CHOL 81 (L) 03/14/2019   Lab Results  Component Value Date   HDL 51 09/23/2020   HDL 49 03/10/2020   HDL 41 03/14/2019   Lab Results  Component Value Date   LDLCALC 53 09/23/2020   LDLCALC 128 (H) 03/10/2020   LDLCALC 23 03/14/2019   Lab Results  Component Value Date   TRIG 113 09/23/2020   TRIG 140 03/10/2020   TRIG 80 03/14/2019   Lab Results  Component Value Date   CHOLHDL 2.4 09/23/2020   CHOLHDL 4.1 03/10/2020   No results found for: LDLDIRECT    Radiology: DG Chest Portable 1 View  Result Date: 02/15/2021 CLINICAL DATA:  Short of breath and leg swelling and  weakness EXAM: PORTABLE CHEST 1 VIEW COMPARISON:  01/10/2021 FINDINGS: Cardiac enlargement. AICD unchanged in position. Mild vascular congestion. Negative for edema or effusion. Mild bibasilar atelectasis. IMPRESSION: Mild pulmonary vascular congestion without edema. Mild bibasilar atelectasis. Marked cardiomegaly. Electronically Signed   By: Franchot Gallo MariaD.   On: 02/15/2021 10:42   US BREAST LTD UNI LEFT INC AXILLA  Result Date: 02/02/2021 CLINICAL DATA:  Left breast asymmetry and calcifications seen on patient's new baseline mammogram. EXAM: DIGITAL DIAGNOSTIC UNILATERAL LEFT MAMMOGRAM WITH TOMOSYNTHESIS AND CAD; ULTRASOUND LEFT BREAST LIMITED TECHNIQUE: Left digital diagnostic mammography and breast tomosynthesis was performed. The images were evaluated with computer-aided detection.; Targeted ultrasound examination of the left breast was performed. COMPARISON:  Previous exam(s). ACR Breast Density Category c: The breast tissue is heterogeneously dense, which may obscure small masses. FINDINGS: Additional mammographic views of the left breast demonstrate loosely grouped punctate calcifications in the slightly lower outer left breast, middle and anterior depth. The area of calcifications spans 6.6 cm in anterior to posterior dimension. No associated masses are seen. Targeted left breast ultrasound is performed demonstrating 2 probably benign cysts at 2:30 o'clock 3 cm from the nipple measuring 0.6 x 0.4 x 0.5 cm and 0.5 x 0.5 x 0.5 cm. IMPRESSION: Loosely grouped calcifications in the left breast are mildly suspicious. Recommend 1 site stereotactic core needle biopsy of the anterior or posterior extent of involvement. Two probably benign left breast 2:30 o'clock masses. With benign pathology results, recommend six-month follow-up for the remaining of the calcifications and the probably benign left breast masses. RECOMMENDATION: One site stereotactic core needle biopsy of the left breast. I have discussed  the findings and recommendations with the patient. If applicable, a reminder letter will be sent to the patient regarding the next appointment. BI-RADS CATEGORY  4: Suspicious. Electronically Signed   By: Fidela Salisbury MariaD.   On: 02/02/2021 11:12  MM DIAG BREAST TOMO UNI LEFT  Result Date: 02/02/2021 CLINICAL DATA:  Left breast asymmetry and calcifications seen on patient's new baseline mammogram. EXAM: DIGITAL DIAGNOSTIC UNILATERAL LEFT MAMMOGRAM WITH  TOMOSYNTHESIS AND CAD; ULTRASOUND LEFT BREAST LIMITED TECHNIQUE: Left digital diagnostic mammography and breast tomosynthesis was performed. The images were evaluated with computer-aided detection.; Targeted ultrasound examination of the left breast was performed. COMPARISON:  Previous exam(s). ACR Breast Density Category c: The breast tissue is heterogeneously dense, which may obscure small masses. FINDINGS: Additional mammographic views of the left breast demonstrate loosely grouped punctate calcifications in the slightly lower outer left breast, middle and anterior depth. The area of calcifications spans 6.6 cm in anterior to posterior dimension. No associated masses are seen. Targeted left breast ultrasound is performed demonstrating 2 probably benign cysts at 2:30 o'clock 3 cm from the nipple measuring 0.6 x 0.4 x 0.5 cm and 0.5 x 0.5 x 0.5 cm. IMPRESSION: Loosely grouped calcifications in the left breast are mildly suspicious. Recommend 1 site stereotactic core needle biopsy of the anterior or posterior extent of involvement. Two probably benign left breast 2:30 o'clock masses. With benign pathology results, recommend six-month follow-up for the remaining of the calcifications and the probably benign left breast masses. RECOMMENDATION: One site stereotactic core needle biopsy of the left breast. I have discussed the findings and recommendations with the patient. If applicable, a reminder letter will be sent to the patient regarding the next appointment.  BI-RADS CATEGORY  4: Suspicious. Electronically Signed   By: Fidela Salisbury MariaD.   On: 02/02/2021 11:12   ECHO 06/1605 SEVERE LV SYSTOLIC DYSFUNCTION (See above)  MILD RV SYSTOLIC DYSFUNCTION (See above)  MODERATE VALVULAR REGURGITATION (See above)  NO VALVULAR STENOSIS  MIODERATe MR, TR  MODERATE PHTN  MILD AR, PR  EF 25%   EKG reviewed by me: V paced 73 bpm   ASSESSMENT AND PLAN:  The patient is a 71 year old female with a past medical history of nonischemic dilated cardiomyopathy, HFrEF (LVEF 25%) s/p CRT-D 11/2014 with change out 09/2020, chronic atrial fibrillation s/p cardioversion 08/2020 not on anticoagulation due to history of GI bleeding, COPD on 3 L at baseline, CKD 4 who [facility] ED the morning of 02/15/2021 with progressively worsening shortness of breath and lower extremity edema.  Cardiology is consulted for assistance with her heart failure.  #Acute on chronic HFrEF (LVEF 25% 06/2019) #Nonischemic dilated cardiomyopathy s/p CRT-D change out 09/2020 The patient reports worsening leg swelling that started yesterday.  She reports compliance with her medications, denies adding salt to foods.  She reports drinking 3 x 16 ounce bottles of water per day and 1 cup of juice.  Her BNP is markedly elevated to 3624, chest x-ray with pulmonary vascular congestion.  Troponin minimally elevated without significant delta likely 2/2 demand ischemia and not ACS.  The patient states her baseline weight is 160 pounds which she is at today -however per nursing reporting she gained 6 pounds in the past 5 days. -S/p 40 mg IV Lasix.  Continue diuresis with 40 mg twice daily with close monitoring of her renal function -Continue midodrine 10 mg 3 times daily -We will decrease dose of Coreg due to hypotension down to 3.125. -Continue spironolactone 12.5 once daily -Recommend strict I's and O's, daily weights, salt and fluid restriction. -Echocardiogram complete performed this afternoon, will  follow results. Home GDMT includes Coreg 6.25 twice daily, torsemide 20 mg once daily, spironolactone 12.5 mg once daily  #COPD on 3 L at baseline -Currently on her baseline oxygen requirement, some expiratory wheezes present -Management per primary team.  #Paroxysmal atrial fibrillation not on anticoagulation due to history of GI bleed Currently V paced,  she was instructed at her last cardiology visit to follow-up with GI to determine when to restart her anticoagulation but has not done so yet.  Per her most recent primary care provider note 2 weeks ago, the patient has had multiple falls at home. -Continue home amiodarone 100 mg, digoxin 0.0625mg ,  -Decrease dose of Coreg as above.  #CKD 4 Appears to be around baseline with creatinine 2.17, GFR 24 (1 month ago 1.7, GFR 30)  This patient's plan of care was discussed and created with Dr. Nehemiah Massed and he is in agreement.  Signed: Tristan Schroeder , PA-C 02/15/2021, 2:12 PM [facility] Cardiology  The patient has been interviewed and examined. I agree with assessment and plan above. Serafina Royals MD [facility]

## 2021-02-16 LAB — ECHOCARDIOGRAM COMPLETE
AR max vel: 1.16 cm2
AV Area VTI: 1.5 cm2
AV Area mean vel: 1.21 cm2
AV Mean grad: 3.7 mmHg
AV Peak grad: 6.7 mmHg
Ao pk vel: 1.29 m/s
Area-P 1/2: 3.77 cm2
Calc EF: 45 %
Height: 65.75 in
MV VTI: 1.36 cm2
P 1/2 time: 394 msec
S' Lateral: 6.7 cm
Single Plane A2C EF: 54.9 %
Single Plane A4C EF: 30.6 %
Weight: 2576 oz

## 2021-02-16 LAB — BASIC METABOLIC PANEL
Anion gap: 10 (ref 5–15)
BUN: 67 mg/dL — ABNORMAL HIGH (ref 8–23)
CO2: 33 mmol/L — ABNORMAL HIGH (ref 22–32)
Calcium: 8.9 mg/dL (ref 8.9–10.3)
Chloride: 94 mmol/L — ABNORMAL LOW (ref 98–111)
Creatinine, Ser: 2.18 mg/dL — ABNORMAL HIGH (ref 0.44–1.00)
GFR, Estimated: 24 mL/min — ABNORMAL LOW (ref >60)
Glucose, Bld: 83 mg/dL (ref 70–99)
Potassium: 3.7 mmol/L (ref 3.5–5.1)
Sodium: 137 mmol/L (ref 135–145)

## 2021-02-16 LAB — CBC
HCT: 31.1 % — ABNORMAL LOW (ref 36.0–46.0)
Hemoglobin: 8.8 g/dL — ABNORMAL LOW (ref 12.0–15.0)
MCH: 25.4 pg — ABNORMAL LOW (ref 26.0–34.0)
MCHC: 28.3 g/dL — ABNORMAL LOW (ref 30.0–36.0)
MCV: 89.6 fL (ref 80.0–100.0)
Platelets: 138 10*3/uL — ABNORMAL LOW (ref 150–400)
RBC: 3.47 MIL/uL — ABNORMAL LOW (ref 3.87–5.11)
RDW: 21.6 % — ABNORMAL HIGH (ref 11.5–15.5)
WBC: 4.3 10*3/uL (ref 4.0–10.5)
nRBC: 2.8 % — ABNORMAL HIGH (ref 0.0–0.2)

## 2021-02-16 LAB — MRSA NEXT GEN BY PCR, NASAL: MRSA by PCR Next Gen: NOT DETECTED

## 2021-02-16 MED ORDER — POTASSIUM CHLORIDE CRYS ER 20 MEQ PO TBCR
40.0000 meq | EXTENDED_RELEASE_TABLET | Freq: Once | ORAL | Status: AC
  Administered 2021-02-16: 40 meq via ORAL
  Filled 2021-02-16: qty 2

## 2021-02-16 NOTE — Progress Notes (Addendum)
[place] NOTE       Patient ID: ALLYAH HEATHER MRN: [id number] DOB/AGE: 1950/07/01 71 y.o.  Admit date: 02/15/2021 Referring Physician Dr. Imagene Sheller Primary Physician Jon Billings, NP Primary Cardiologist Dr. Saralyn Pilar Reason for Consultation CHF  HPI: The patient is a 71 year old female with a past medical history of nonischemic dilated cardiomyopathy, HFrEF (02/1420 LVEF <20%, severe MR, g2 DD compared to 25% 06/2019) s/p CRT-D 11/2014 with change out 09/2020, paroxysmal atrial fibrillation s/p cardioversion 08/2020 not on anticoagulation due to history of GI bleeding, COPD on 3 L at baseline, CKD 4 who presented to [facility] ED the morning of 02/15/2021 with progressively worsening shortness of breath and lower extremity edema.  Cardiology is consulted for assistance with her heart failure.   Interval history: -feels better today stating she "doesn't have hiccups or heartburn and it's a great day to be alive."  -thinks her leg swelling has improved some, no chest pain. Breathing is the same.  -Pt does not want to wear purewick so I&Os hard to track  -renal function stable.  Review of systems complete and found to be negative unless listed above   Past Medical History:  Diagnosis Date   Acute appendicitis 02/17/2018   AICD (automatic cardioverter/defibrillator) present    Anxiety state    Arrhythmia    atrial fibrillation   CAD (coronary artery disease)    Cardiomyopathy (HCC)    CHF (congestive heart failure) (HCC)    COPD (chronic obstructive pulmonary disease) ([place])    Dyspnea    Headache    History of kidney stones    History of shingles    Hypertension    Influenza A 11/04/2020   On home oxygen therapy    bedtime and prn   Restless leg syndrome     Past Surgical History:  Procedure Laterality Date   CARDIAC CATHETERIZATION  04/2014   Dr. Idelle Leech   CARDIAC CATHETERIZATION     CARDIAC DEFIBRILLATOR PLACEMENT     CARDIOVERSION N/A 08/11/2020    Procedure: CARDIOVERSION;  Surgeon: Dixie Dials, MD;  Location: [place];  Service: Cardiovascular;  Laterality: N/A;   CHOLECYSTECTOMY N/A 08/23/2016   Procedure: LAPAROSCOPIC CHOLECYSTECTOMY;  Surgeon: Clayburn Pert, MD;  Location: [facility] ORS;  Service: General;  Laterality: N/A;   COLONOSCOPY WITH PROPOFOL N/A 12/15/2020   Procedure: COLONOSCOPY WITH PROPOFOL;  Surgeon: Lin Landsman, MD;  Location: [place];  Service: Gastroenterology;  Laterality: N/A;   COLONOSCOPY WITH PROPOFOL N/A 12/16/2020   Procedure: COLONOSCOPY WITH PROPOFOL;  Surgeon: Lucilla Lame, MD;  Location: [facility] ENDOSCOPY;  Service: Endoscopy;  Laterality: N/A;   DILATATION & CURETTAGE/HYSTEROSCOPY WITH MYOSURE N/A 06/27/2018   Procedure: DILATATION & CURETTAGE/HYSTEROSCOPY WITH MYOSURE;  Surgeon: Malachy Mood, MD;  Location: [facility] ORS;  Service: Gynecology;  Laterality: N/A;   ESOPHAGOGASTRODUODENOSCOPY N/A 12/15/2020   Procedure: ESOPHAGOGASTRODUODENOSCOPY (EGD);  Surgeon: Lin Landsman, MD;  Location: [facility] ENDOSCOPY;  Service: Gastroenterology;  Laterality: N/A;   HERNIA REPAIR     LAPAROSCOPIC APPENDECTOMY N/A 02/17/2018   Procedure: APPENDECTOMY LAPAROSCOPIC;  Surgeon: Benjamine Sprague, DO;  Location: [facility] ORS;  Service: General;  Laterality: N/A;   PPM GENERATOR CHANGEOUT N/A 09/14/2020   Procedure: PPM GENERATOR CHANGEOUT;  Surgeon: Isaias Cowman, MD;  Location: [place] CV LAB;  Service: Cardiovascular;  Laterality: N/A;   TEE WITHOUT CARDIOVERSION N/A 08/10/2020   Procedure: TRANSESOPHAGEAL ECHOCARDIOGRAM (TEE);  Surgeon: Dixie Dials, MD;  Location: [place];  Service: Cardiovascular;  Laterality: N/A;  Medications Prior to Admission  Medication Sig Dispense Refill Last Dose   amiodarone (PACERONE) 200 MG tablet Take 1 tablet (200 mg total) by mouth daily. (Patient taking differently: Take 100 mg by mouth daily.) 30 tablet 3 02/14/2021   calcitRIOL (ROCALTROL) 0.25 MCG  capsule Take 0.25 mcg by mouth daily.   02/14/2021 at 0930   carvedilol (COREG) 6.25 MG tablet Take 6.25 mg by mouth 2 (two) times daily.   02/14/2021 at 1930   digoxin (LANOXIN) 0.125 MG tablet Take 125 mcg by mouth daily.   02/14/2021 at 0930   fluticasone (FLONASE) 50 MCG/ACT nasal spray Place 2 sprays into both nostrils daily. 16 g 6 02/14/2021 at 1930   Fluticasone-Umeclidin-Vilant (TRELEGY ELLIPTA) 100-62.5-25 MCG/INH AEPB Inhale 1 puff into the lungs daily.   02/14/2021 at 1930   gabapentin (NEURONTIN) 100 MG capsule Take 1 capsule (100 mg total) by mouth at bedtime. 30 capsule 3 02/14/2021 at 1930   levothyroxine (SYNTHROID) 175 MCG tablet Take 1 tablet (175 mcg total) by mouth daily. 90 tablet 3 02/14/2021 at 0845   midodrine (PROAMATINE) 5 MG tablet TAKE 1 TABLET BY MOUTH 3 TIMES DAILY WITH MEALS 90 tablet 0 02/14/2021 at 1900   pantoprazole (PROTONIX) 40 MG tablet Take 1 tablet (40 mg total) by mouth daily. 90 tablet 1 02/14/2021 at 0930   spironolactone (ALDACTONE) 25 MG tablet Take 12.5 mg by mouth daily.   02/14/2021 at 1730   sucralfate (CARAFATE) 1 g tablet Take 1 tablet (1 g total) by mouth 4 (four) times daily. 40 tablet 0 02/14/2021 at 1900   torsemide (DEMADEX) 20 MG tablet Take 20 mg by mouth daily.   02/14/2021 at 0930   albuterol (VENTOLIN HFA) 108 (90 Base) MCG/ACT inhaler Inhale 2 puffs into the lungs every 6 (six) hours as needed for wheezing or shortness of breath. 18 g 1 prn   alum & mag hydroxide-simeth (MAALOX/MYLANTA) 200-200-20 MG/5ML suspension Take 30 mLs by mouth every 4 (four) hours as needed for indigestion or heartburn. (Patient not taking: Reported on 02/15/2021) 355 mL 0 Not Taking   iron polysaccharides (NIFEREX) 150 MG capsule Take 1 capsule (150 mg total) by mouth daily. 30 capsule 0 02/13/2021   OXYGEN Inhale 3 L into the lungs 3 (three) times daily as needed (shortness of breath or coughing).      vitamin B-12 1000 MCG tablet Take 1 tablet (1,000 mcg total) by mouth  daily. 30 tablet 2 02/13/2021    Social History   Socioeconomic History   Marital status: Divorced    Spouse name: Not on file   Number of children: Not on file   Years of education: Not on file   Highest education level: High school graduate  Occupational History   Occupation: retired  Tobacco Use   Smoking status: Former    Packs/day: 0.25    Types: Cigarettes   Smokeless tobacco: Never   Tobacco comments:    quit at age 34, whole pack lasted 3 weeks   Vaping Use   Vaping Use: Never used  Substance and Sexual Activity   Alcohol use: No    Alcohol/week: 0.0 standard drinks   Drug use: No   Sexual activity: Not Currently    Birth control/protection: None  Other Topics Concern   Not on file  Social History Narrative   Not on file   Social Determinants of Health   Financial Resource Strain: Not on file  Food Insecurity: Not on file  Transportation Needs:  Not on file  Physical Activity: Not on file  Stress: Not on file  Social Connections: Not on file  Intimate Partner Violence: Not on file    Family History  Problem Relation Age of Onset   Diabetes Mellitus II Mother    Hypertension Mother    Heart attack Father    Arthritis Sister    Arthritis Sister    Thyroid disease Brother    Heart disease Brother    Diabetes Brother    Depression Daughter    Depression Son    Schizophrenia Son    Breast cancer Neg Hx       Review of systems complete and found to be negative unless listed above    PHYSICAL EXAM General: Elderly Caucasian female, well nourished, in no acute distress.  Sitting upright with legs off PCU bed. HEENT:  Normocephalic and atraumatic.  Lips appear more pink today Neck:  No JVD.  Lungs: Normal respiratory effort on 3 L by nasal cannula.  Poor air movement, bibasilar crackles. No wheezing. Heart: HRRR . Normal S1 and S2 without gallops. 3/6 holosystolic murmur. Radial & DP pulses 2+ bilaterally. Abdomen: Non-distended appearing.  Msk:  Normal strength and tone for age. Extremities: Warm and well perfused. No clubbing.  1+ pitting bilateral lower extremity edema up to patient's mid thigh, improving Neuro: Alert and oriented X 3. Psych:  Answers questions appropriately.   Labs:   Lab Results  Component Value Date   WBC 4.3 02/16/2021   HGB 8.8 (L) 02/16/2021   HCT 31.1 (L) 02/16/2021   MCV 89.6 02/16/2021   PLT 138 (L) 02/16/2021    Recent Labs  Lab 02/15/21 1056 02/16/21 0557  NA  --  137  K  --  3.7  CL  --  94*  CO2  --  33*  BUN  --  67*  CREATININE  --  2.18*  CALCIUM  --  8.9  PROT 6.6  --   BILITOT 1.9*  --   ALKPHOS 91  --   ALT 21  --   AST 25  --   GLUCOSE  --  83    Lab Results  Component Value Date   TROPONINI <0.03 09/02/2014     Lab Results  Component Value Date   CHOL 124 09/23/2020   CHOL 202 (H) 03/10/2020   CHOL 81 (L) 03/14/2019   Lab Results  Component Value Date   HDL 51 09/23/2020   HDL 49 03/10/2020   HDL 41 03/14/2019   Lab Results  Component Value Date   LDLCALC 53 09/23/2020   LDLCALC 128 (H) 03/10/2020   LDLCALC 23 03/14/2019   Lab Results  Component Value Date   TRIG 113 09/23/2020   TRIG 140 03/10/2020   TRIG 80 03/14/2019   Lab Results  Component Value Date   CHOLHDL 2.4 09/23/2020   CHOLHDL 4.1 03/10/2020   No results found for: LDLDIRECT    Radiology: DG Chest Portable 1 View  Result Date: 02/15/2021 CLINICAL DATA:  Short of breath and leg swelling and weakness EXAM: PORTABLE CHEST 1 VIEW COMPARISON:  01/10/2021 FINDINGS: Cardiac enlargement. AICD unchanged in position. Mild vascular congestion. Negative for edema or effusion. Mild bibasilar atelectasis. IMPRESSION: Mild pulmonary vascular congestion without edema. Mild bibasilar atelectasis. Marked cardiomegaly. Electronically Signed   By: Franchot Gallo M.D.   On: 02/15/2021 10:42   US BREAST LTD UNI LEFT INC AXILLA  Result Date: 02/02/2021 CLINICAL DATA:  Left breast asymmetry and  calcifications seen on patient's new baseline mammogram. EXAM: DIGITAL DIAGNOSTIC UNILATERAL LEFT MAMMOGRAM WITH TOMOSYNTHESIS AND CAD; ULTRASOUND LEFT BREAST LIMITED TECHNIQUE: Left digital diagnostic mammography and breast tomosynthesis was performed. The images were evaluated with computer-aided detection.; Targeted ultrasound examination of the left breast was performed. COMPARISON:  Previous exam(s). ACR Breast Density Category c: The breast tissue is heterogeneously dense, which may obscure small masses. FINDINGS: Additional mammographic views of the left breast demonstrate loosely grouped punctate calcifications in the slightly lower outer left breast, middle and anterior depth. The area of calcifications spans 6.6 cm in anterior to posterior dimension. No associated masses are seen. Targeted left breast ultrasound is performed demonstrating 2 probably benign cysts at 2:30 o'clock 3 cm from the nipple measuring 0.6 x 0.4 x 0.5 cm and 0.5 x 0.5 x 0.5 cm. IMPRESSION: Loosely grouped calcifications in the left breast are mildly suspicious. Recommend 1 site stereotactic core needle biopsy of the anterior or posterior extent of involvement. Two probably benign left breast 2:30 o'clock masses. With benign pathology results, recommend six-month follow-up for the remaining of the calcifications and the probably benign left breast masses. RECOMMENDATION: One site stereotactic core needle biopsy of the left breast. I have discussed the findings and recommendations with the patient. If applicable, a reminder letter will be sent to the patient regarding the next appointment. BI-RADS CATEGORY  4: Suspicious. Electronically Signed   By: Fidela Salisbury M.D.   On: 02/02/2021 11:12  MM DIAG BREAST TOMO UNI LEFT  Result Date: 02/02/2021 CLINICAL DATA:  Left breast asymmetry and calcifications seen on patient's new baseline mammogram. EXAM: DIGITAL DIAGNOSTIC UNILATERAL LEFT MAMMOGRAM WITH TOMOSYNTHESIS AND CAD;  ULTRASOUND LEFT BREAST LIMITED TECHNIQUE: Left digital diagnostic mammography and breast tomosynthesis was performed. The images were evaluated with computer-aided detection.; Targeted ultrasound examination of the left breast was performed. COMPARISON:  Previous exam(s). ACR Breast Density Category c: The breast tissue is heterogeneously dense, which may obscure small masses. FINDINGS: Additional mammographic views of the left breast demonstrate loosely grouped punctate calcifications in the slightly lower outer left breast, middle and anterior depth. The area of calcifications spans 6.6 cm in anterior to posterior dimension. No associated masses are seen. Targeted left breast ultrasound is performed demonstrating 2 probably benign cysts at 2:30 o'clock 3 cm from the nipple measuring 0.6 x 0.4 x 0.5 cm and 0.5 x 0.5 x 0.5 cm. IMPRESSION: Loosely grouped calcifications in the left breast are mildly suspicious. Recommend 1 site stereotactic core needle biopsy of the anterior or posterior extent of involvement. Two probably benign left breast 2:30 o'clock masses. With benign pathology results, recommend six-month follow-up for the remaining of the calcifications and the probably benign left breast masses. RECOMMENDATION: One site stereotactic core needle biopsy of the left breast. I have discussed the findings and recommendations with the patient. If applicable, a reminder letter will be sent to the patient regarding the next appointment. BI-RADS CATEGORY  4: Suspicious. Electronically Signed   By: Fidela Salisbury M.D.   On: 02/02/2021 11:12  ECHOCARDIOGRAM COMPLETE  Result Date: 02/16/2021    ECHOCARDIOGRAM REPORT   Patient Name:   MICHOL EMORY Date of Exam: 02/15/2021 Medical Rec #:  [id number]    Height:       65.7 in Accession #:    [id number]   Weight:       161.0 lb Date of Birth:  01/23/1950    BSA:          1.819 m Patient Age:  70 years     BP:           112/54 mmHg Patient Gender: F            HR:            73 bpm. Exam Location:  [facility] Procedure: 2D Echo, Cardiac Doppler and Color Doppler Indications:     CHF-acute systolic H73.42  History:         Patient has prior history of Echocardiogram examinations, most                  recent 08/10/2020. CAD, AICD, COPD; Risk Factors:Hypertension.  Sonographer:     Sherrie Sport Referring Phys:  [place] Diagnosing Phys: Serafina Royals MD  Sonographer Comments: Suboptimal apical window. IMPRESSIONS  1. Left ventricular ejection fraction, by estimation, is <20%. The left ventricle has severely decreased function. The left ventricle demonstrates global hypokinesis. The left ventricular internal cavity size was severely dilated. There is mild left ventricular hypertrophy. Left ventricular diastolic parameters are consistent with Grade II diastolic dysfunction (pseudonormalization).  2. Right ventricular systolic function is mildly reduced. The right ventricular size is moderately enlarged.  3. Left atrial size was mildly dilated.  4. Right atrial size was mildly dilated.  5. The mitral valve is normal in structure. Severe mitral valve regurgitation.  6. Tricuspid valve regurgitation is moderate.  7. The aortic valve is normal in structure. Aortic valve regurgitation is trivial. FINDINGS  Left Ventricle: Left ventricular ejection fraction, by estimation, is <20%. The left ventricle has severely decreased function. The left ventricle demonstrates global hypokinesis. The left ventricular internal cavity size was severely dilated. There is mild left ventricular hypertrophy. Left ventricular diastolic parameters are consistent with Grade II diastolic dysfunction (pseudonormalization). Right Ventricle: The right ventricular size is moderately enlarged. No increase in right ventricular wall thickness. Right ventricular systolic function is mildly reduced. Left Atrium: Left atrial size was mildly dilated. Right Atrium: Right atrial size was mildly dilated. Pericardium:  There is no evidence of pericardial effusion. Mitral Valve: The mitral valve is normal in structure. Severe mitral valve regurgitation. MV peak gradient, 6.0 mmHg. The mean mitral valve gradient is 2.0 mmHg. Tricuspid Valve: The tricuspid valve is normal in structure. Tricuspid valve regurgitation is moderate. Aortic Valve: The aortic valve is normal in structure. Aortic valve regurgitation is trivial. Aortic regurgitation PHT measures 394 msec. Aortic valve mean gradient measures 3.7 mmHg. Aortic valve peak gradient measures 6.7 mmHg. Aortic valve area, by VTI measures 1.50 cm. Pulmonic Valve: The pulmonic valve was normal in structure. Pulmonic valve regurgitation is trivial. Aorta: The aortic root and ascending aorta are structurally normal, with no evidence of dilitation. IAS/Shunts: No atrial level shunt detected by color flow Doppler.  LEFT VENTRICLE PLAX 2D LVIDd:         7.10 cm      Diastology LVIDs:         6.70 cm      LV e' medial:    6.20 cm/s LV PW:         1.10 cm      LV E/e' medial:  14.6 LV IVS:        0.80 cm      LV e' lateral:   7.83 cm/s LVOT diam:     2.00 cm      LV E/e' lateral: 11.6 LV SV:         30 LV SV Index:   17 LVOT Area:  3.14 cm  LV Volumes (MOD) LV vol d, MOD A2C: 221.0 ml LV vol d, MOD A4C: 258.0 ml LV vol s, MOD A2C: 99.7 ml LV vol s, MOD A4C: 179.0 ml LV SV MOD A2C:     121.3 ml LV SV MOD A4C:     258.0 ml LV SV MOD BP:      105.8 ml RIGHT VENTRICLE RV Basal diam:  6.60 cm RV S prime:     9.46 cm/s TAPSE (M-mode): 2.3 cm LEFT ATRIUM              Index        RIGHT ATRIUM           Index LA diam:        6.10 cm  3.35 cm/m   RA Area:     29.60 cm LA Vol (A2C):   170.0 ml 93.46 ml/m  RA Volume:   109.00 ml 59.92 ml/m LA Vol (A4C):   163.0 ml 89.61 ml/m LA Biplane Vol: 173.0 ml 95.11 ml/m  AORTIC VALVE AV Area (Vmax):    1.16 cm AV Area (Vmean):   1.21 cm AV Area (VTI):     1.50 cm AV Vmax:           129.33 cm/s AV Vmean:          86.467 cm/s AV VTI:            0.202  m AV Peak Grad:      6.7 mmHg AV Mean Grad:      3.7 mmHg LVOT Vmax:         47.60 cm/s LVOT Vmean:        33.400 cm/s LVOT VTI:          0.097 m LVOT/AV VTI ratio: 0.48 AI PHT:            394 msec  AORTA Ao Root diam: 2.90 cm MITRAL VALVE               TRICUSPID VALVE MV Area (PHT): 3.77 cm    TR Peak grad:   40.4 mmHg MV Area VTI:   1.36 cm    TR Vmax:        318.00 cm/s MV Peak grad:  6.0 mmHg MV Mean grad:  2.0 mmHg    SHUNTS MV Vmax:       1.22 m/s    Systemic VTI:  0.10 m MV Vmean:      67.0 cm/s   Systemic Diam: 2.00 cm MV Decel Time: 201 msec MV E velocity: 90.80 cm/s MV A velocity: 44.60 cm/s MV E/A ratio:  2.04 Serafina Royals MD Electronically signed by Serafina Royals MD Signature Date/Time: 02/16/2021/8:07:09 AM    Final     ECHO 2.14.23 - LVEF <20% with severely decreased LV fucntion, severe MR, g2 DD  05/3974 SEVERE LV SYSTOLIC DYSFUNCTION (See above)  MILD RV SYSTOLIC DYSFUNCTION (See above)  MODERATE VALVULAR REGURGITATION (See above)  NO VALVULAR STENOSIS  MIODERATe MR, TR  MODERATE PHTN  MILD AR, PR  EF 25%   EKG reviewed by me: V paced 73 bpm   ASSESSMENT AND PLAN:  The patient is a 71 year old female with a past medical history of nonischemic dilated cardiomyopathy, HFrEF (02/1420 LVEF <20%, severe MR, g2 DD compared to 25% 06/2019) s/p CRT-D 11/2014 with change out 09/2020, chronic atrial fibrillation s/p cardioversion 08/2020 not on anticoagulation due to history of GI bleeding, COPD on 3 L at baseline,  CKD 4 who presented to [facility] ED the morning of 02/15/2021 with progressively worsening shortness of breath and lower extremity edema.  Cardiology is consulted for assistance with her heart failure.  #Acute on chronic HFrEF (02/1420 LVEF <20%, severe MR, g2 DD compared to 25% 06/2019) #Nonischemic dilated cardiomyopathy s/p CRT-D change out 09/2020 The patient reports worsening leg swelling that started yesterday.  She reports compliance with her medications, denies adding salt to  foods.  She reports drinking 3 x 16 ounce bottles of water per day and 1 cup of juice.  Her BNP is markedly elevated to 3624, chest x-ray with pulmonary vascular congestion.  Troponin minimally elevated without significant delta likely 2/2 demand ischemia and not ACS. Per nursing reporting she gained 6 pounds in the past 5 days. Appeared volume overloaded on admission.  -S/p 40 mg IV Lasix.  Continue diuresis with 40 mg twice daily for today and likely transition back to PO torsemide tomorrow.  -Continue midodrine 10 mg 3 times daily -continue low dose Coreg 3.125 BID -Continue spironolactone 12.5 once daily -Recommend strict I's and O's, daily weights, salt and fluid restriction. -Echocardiogram resulted with EF <20%, severe MR, g2 DD. Will continue GDMT as above.  Home GDMT includes Coreg 6.25 twice daily, torsemide 20 mg once daily, spironolactone 12.5 mg once daily  #COPD on 3 L at baseline -Currently on her baseline oxygen requirement. -Management per primary team.  #Paroxysmal atrial fibrillation not on anticoagulation due to history of GI bleed Currently V paced, she was instructed at her last cardiology visit to follow-up with GI to determine when to restart her anticoagulation but has not done so yet.  Per her most recent primary care provider note 2 weeks ago, the patient has had multiple falls at home. -Continue home amiodarone 100 mg. Digoxin discontinued by primary team.  -Decrease dose of Coreg as above.  #CKD 4 Appears to be around baseline with creatinine 2.17, GFR 24 (1 month ago 1.7, GFR 30)  This patient's plan of care was discussed and created with Dr. Nehemiah Massed and he is in agreement.  Signed: Tristan Schroeder , PA-C 02/16/2021, 9:20 AM [facility]

## 2021-02-16 NOTE — Consult Note (Addendum)
° °  Heart Failure Nurse Navigator Note  HfrEF less than 20%.  Severe mitral regurgitation.  Grade 2 diastolic dysfunction.  Previously reported at 20 to 25%.  She presented to the emergency room with complaints of worsening shortness of breath and worsening lower extremity edema.  Stated she had gained 6 pounds over the last 5 days. BNP elevated at 3624.  Chest x-ray with cardiac enlargement, mild pulmonary vascular congestion.  Comorbidities:  Anxiety Atrial fibrillation Nonischemic cardiomyopathy GI bleed not on anticoagulation COPD with 3 L O2 at baseline  Medications:  Amiodarone 100 mg daily Carvedilol 3.125 mg 2 times a day with meals Furosemide 40 mg IV 2 times a day Levothyroxine 175 mcg daily Midodrine 10 mg 3 times a day with meals Potassium chloride 40 mEq once daily Spironolactone 12 and half milligrams daily  Labs:  Sodium 137, potassium 3.7, chloride 94, CO 2-33, BUN 67, creatinine 2.81, GFR estimated at 24, digoxin 2.0, total bilirubin 1.9, direct bilirubin 0.8, indirect bilirubin 1.1. Weight is 78.6 Blood pressure 96/66  Initial meeting with patient on this admission, her daughter later joined.  She states that she lives at home with her daughter.  She does not weigh herself on a daily basis.  Discussed the importance of daily weight and reporting 2 to 3 pound weight gain overnight or total of 5 pounds within the week.  Also discussed symptoms to report.  Discussed sodium and fluid restriction.  He states at times she does use [place] soup.  She did not feel that she went over 64 ounces in a days time.  She states that she drinks 3-6 ounce bottles of water daily and will have cranberry juice with breakfast.  Her daughter, [name] sets up her medications in a weekly pillbox.  Patient states that she has been compliant and not missed any of her medications.  Discussed the new program with [facility] that monitors her at home with a scale, blood pressure  cuff and an EKG device for monitoring rhythms.  Patient states that she is not email savvy but that she does texting  Daughter does both and has the capabilities of the tele visit with iPad.  Both concur that they would like for the patient to get involved in this program.  They were given a handout about [facility]. Referral form filled out and info called to [employer].  She has follow-up in the outpatient heart failure clinic on February 24 at 11:30 in the morning.  She has a 3% rating of no-show which is 3 out of 103 appointments.   Pricilla Riffle RN [facility]

## 2021-02-16 NOTE — Progress Notes (Signed)
No output since midnight ; bladder scanned- 323ml;; offered bedside commode- voided 366ml.   Purewick removed at this time.

## 2021-02-16 NOTE — TOC Initial Note (Signed)
Transition of Care [facility]) - Initial/Assessment Note    Patient Details  Name: Maria Neal MRN: [id number] Date of Birth: Feb 20, 1950  Transition of Care [facility]) CM/SW Contact:    Alberteen Sam, LCSW Phone Number: 02/16/2021, 11:41 AM  Clinical Narrative:                  CSW met with patient and daughter Maria Neal at bedside to complete readmission risk screen.   They report patient at baseline is on 3L of oxygen at home through [employer].   Patient has a cane, 3in1 and walker at home, is currently living with her daughter Maria Neal.  Reports going to [facility] in [place] for medications, continues to see Jon Billings, NP at PCP.   Reports being active with PT, OT and RN through [facility], will need resumption orders at time of discharge.   Patient's daughter Maria Neal to transport at time of discharge.   No identified needs at this time.  Expected Discharge Plan: Home/Self Care Barriers to Discharge: Continued Medical Work up   Patient Goals and CMS Choice Patient states their goals for this hospitalization and ongoing recovery are:: to go home CMS Medicare.gov Compare Post Acute Care list provided to:: Patient Choice offered to / list presented to : Patient  Expected Discharge Plan and Services Expected Discharge Plan: Home/Self Care                                              Prior Living Arrangements/Services   Lives with:: Adult Children                   Activities of Daily Living      Permission Sought/Granted                  Emotional Assessment       Orientation: : Oriented to Self, Oriented to Place, Oriented to  Time, Oriented to Situation Alcohol / Substance Use: Not Applicable Psych Involvement: No (comment)  Admission diagnosis:  Acute exacerbation of CHF (congestive heart failure) (HCC) [I50.9] Patient Active Problem List   Diagnosis Date Noted   Acute exacerbation of CHF (congestive heart failure) ([place]) 02/15/2021    Hypoxemia 01/10/2021   Pedal edema 01/10/2021   GI bleed 12/15/2020   Acute blood loss anemia (ABLA) 12/14/2020   Acute GI bleeding    Aortic atherosclerosis ([place]) 12/13/2020   Thrombophilia ([place]) 12/13/2020   COPD exacerbation ([place]) 11/04/2020   Chronic respiratory failure with hypoxia (HCC) - 3 L/min 11/04/2020   Acute on chronic systolic CHF (congestive heart failure) ([place]) 11/04/2020   Acute on chronic respiratory failure with hypoxia ([place]) - home O2 @ 3 L/min 11/04/2020   Chronic a-fib ([place]) 11/04/2020   Hypothyroidism 10/14/2020   Current use of long term anticoagulation 11/94/1740   Acute systolic heart failure ([place]) 08/07/2020   Medication management 06/25/2020   Chronic kidney disease, stage 3a ([place]) 09/04/2019   IFG (impaired fasting glucose) 04/01/2019   Leg pain 07/23/2018   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/23/2018   Chronic venous insufficiency 07/23/2018   Simple endometrial hyperplasia without atypia 07/04/2018   Epigastric pain 06/28/2018   Cervical radiculitis 05/17/2017   Hypercholesteremia 05/02/2017   Obesity (BMI 30-39.9) 05/02/2017   Mitral valve insufficiency 04/16/2017   Closed fracture of lateral malleolus 03/08/2017   Chronic  systolic congestive heart failure, NYHA class 3 ([place]) 08/07/2016   NICM (nonischemic cardiomyopathy) ([place]) 08/07/2016   S/P ICD (internal cardiac defibrillator) procedure 12/02/2014   Bundle branch block, left 10/02/2014   Benign essential HTN 05/07/2014   CAD (coronary artery disease) 05/07/2014   COPD, severe ([place]) 05/07/2014   S/P cardiac catheterization 04/23/2014   SOB (shortness of breath) on exertion 11/06/2013   PCP:  Jon Billings, NP Pharmacy:   [facility] [address] [place] [address] Phone: [phone] Fax: [place], [place] [address]  [address] Phone: [phone] Fax: [place], [place]. [place] [address] Phone: [phone] Fax: [phone]     Social Determinants of Health (SDOH) Interventions    Readmission Risk Interventions Readmission Risk Prevention Plan 12/17/2020  Transportation Screening Complete  PCP or Specialist Appt within 3-5 Days Complete  HRI or Home Care Consult Complete  Palliative Care Screening Not Applicable  Medication Review (RN Care Manager) Referral to Pharmacy  Some recent data might be hidden

## 2021-02-16 NOTE — Progress Notes (Signed)
PROGRESS NOTE    Maria Neal  GEX:[id number] DOB: [birth year] DOA: 02/15/2021 PCP: Jon Billings, NP  [phone]   Assessment & Plan:   Principal Problem:   Acute exacerbation of CHF (congestive heart failure) (HCC)   TALEYA Neal is a 71 y.o. female with a past medical history of congestive heart failure with reduced EF of 20-25% based off ECHO on 8/22 status post AICD, chronic atrial fibrillation not on chronic anticoagulation due to history of GI bleed, COPD with chronic hypoxic respiratory failure on 3L oxygen at baseline, hypothyroidism, chronic hypotension on midodrine.   This patients presented to the ER from home with progressing shortness of breath and worsening lower extremity edema. She reportedly has gained 6 lbs over the past 5 days.  She was on 4 L and dropped to the mid 80s via pulse ox when she ambulated to the bedside commode.    Acute on chronic combined CHF exacerbation:  Severe mitral regurg Reduced ejection fraction 20 to 25% based off echo on 08/2020. Status post AICD.   --current Echo showed LVEF worsened to <20%.   --BNP is markedly elevated to 3624, chest x-ray with pulmonary vascular congestion.  --Started scheduled IV diuresis with Lasix 40 twice daily.   --cardio consulted Plan: --cont IV lasix 40 mg BID --cont coreg (dose decreased) and spironolactone   Acute on chronic hypoxic respiratory failure:  3L home O2 at baseline On 4L currently.   --Continue supplemental O2 to keep sats between 88-92%, wean as tolerated   Elevated high-sensitivity troponins 2/2 demand ischemia  --trop 120's. Denies any chest pain.    Chronic hypotension:  On midodrine 5 mg 3 times daily home. --cont increased midodrine 10 mg TID   Chronic kidney disease stage IV: Appears at baseline.  Monitor closely. Baseline creatinine appears to be around 1.7-2.1.  Continue home Calcitrol   Chronic atrial fibrillation: Not on chronic anticoagulation.   --on home digoxin,  amiodarone and Coreg.   --digoxin held per cardio --cont amiodarone and coreg (dose decreased)  COPD: Not in acute exacerbation.  Continue home Trelegy inhaler as Bre[place] and Incruse  Ventolin HFA inhaler as needed.    Peripheral neuropathy: Continue home gabapentin   Anemia of chronic renal disease:  Iron def --cont home iron supplement   GERD: Continue home Protonix and Carafate   Hypothyroidism: Continue home levothyroxine   DVT prophylaxis: Heparin SQ Code Status: Full code  Family Communication: daughter updated at bedside today  Level of care: Progressive Dispo:   The patient is from: home Anticipated d/c is to: home Anticipated d/c date is: 1-2 days Patient currently is not medically ready to d/c due to: on IV lasix, cardiology to clear   Subjective and Interval History:  Pt denied dyspnea, however, daughter reported pt did have dyspnea.  Pt complained of dry mouth.     Objective: Vitals:   02/16/21 0442 02/16/21 0658 02/16/21 0746 02/16/21 1132  BP: (!) 101/49  (!) 104/58 96/66  Pulse: 70  71 72  Resp:   17 17  Temp:   98 F (36.7 C) 97.8 F (36.6 C)  TempSrc:      SpO2: 96%  98% 95%  Weight:  78.6 kg    Height:        Intake/Output Summary (Last 24 hours) at 02/16/2021 1538 Last data filed at 02/16/2021 1300 Gross per 24 hour  Intake 720 ml  Output 775 ml  Net -55 ml   Filed Weights   02/15/21  1014 02/15/21 1534 02/16/21 0658  Weight: 73 kg 77.4 kg 78.6 kg    Examination:   Constitutional: NAD, AAOx3 HEENT: conjunctivae and lids normal, EOMI CV: No cyanosis.   RESP: normal respiratory effort, on 4L Extremities: tense edema in BLE SKIN: warm, dry Neuro: II - XII grossly intact.   Psych: Normal mood and affect.     Data Reviewed: I have personally reviewed following labs and imaging studies  CBC: Recent Labs  Lab 02/15/21 1055 02/16/21 0557  WBC 4.2 4.3  HGB 9.8* 8.8*  HCT 34.9* 31.1*  MCV 91.6 89.6  PLT 154 138*   Basic  Metabolic Panel: Recent Labs  Lab 02/15/21 1055 02/16/21 0557  NA 136 137  K 4.1 3.7  CL 96* 94*  CO2 29 33*  GLUCOSE 94 83  BUN 66* 67*  CREATININE 2.19* 2.18*  CALCIUM 9.0 8.9   GFR: Estimated Creatinine Clearance: 24.4 mL/min (A) (by C-G formula based on SCr of 2.18 mg/dL (H)). Liver Function Tests: Recent Labs  Lab 02/15/21 1056  AST 25  ALT 21  ALKPHOS 91  BILITOT 1.9*  PROT 6.6  ALBUMIN 3.0*   No results for input(s): LIPASE, AMYLASE in the last 168 hours. No results for input(s): AMMONIA in the last 168 hours. Coagulation Profile: No results for input(s): INR, PROTIME in the last 168 hours. Cardiac Enzymes: No results for input(s): CKTOTAL, CKMB, CKMBINDEX, TROPONINI in the last 168 hours. BNP (last 3 results) No results for input(s): PROBNP in the last 8760 hours. HbA1C: No results for input(s): HGBA1C in the last 72 hours. CBG: No results for input(s): GLUCAP in the last 168 hours. Lipid Profile: No results for input(s): CHOL, HDL, LDLCALC, TRIG, CHOLHDL, LDLDIRECT in the last 72 hours. Thyroid Function Tests: No results for input(s): TSH, T4TOTAL, FREET4, T3FREE, THYROIDAB in the last 72 hours. Anemia Panel: No results for input(s): VITAMINB12, FOLATE, FERRITIN, TIBC, IRON, RETICCTPCT in the last 72 hours. Sepsis Labs: No results for input(s): PROCALCITON, LATICACIDVEN in the last 168 hours.  Recent Results (from the past 240 hour(s))  Resp Panel by RT-PCR (Flu A&B, Covid) Nasopharyngeal Swab     Status: None   Collection Time: 02/15/21 10:56 AM   Specimen: Nasopharyngeal Swab; Nasopharyngeal(NP) swabs in vial transport medium  Result Value Ref Range Status   SARS Coronavirus 2 by RT PCR NEGATIVE NEGATIVE Final    Comment: (NOTE) SARS-CoV-2 target nucleic acids are NOT DETECTED.  The SARS-CoV-2 RNA is generally detectable in upper respiratory specimens during the acute phase of infection. The lowest concentration of SARS-CoV-2 viral copies this  assay can detect is 138 copies/mL. A negative result does not preclude SARS-Cov-2 infection and should not be used as the sole basis for treatment or other patient management decisions. A negative result may occur with  improper specimen collection/handling, submission of specimen other than nasopharyngeal swab, presence of viral mutation(s) within the areas targeted by this assay, and inadequate number of viral copies(<138 copies/mL). A negative result must be combined with clinical observations, patient history, and epidemiological information. The expected result is Negative.  Fact Sheet for Patients:  EntrepreneurPulse.com.au  Fact Sheet for Healthcare Providers:  IncredibleEmployment.be  This test is no t yet approved or cleared by the [place] FDA and  has been authorized for detection and/or diagnosis of SARS-CoV-2 by FDA under an Emergency Use Authorization (EUA). This EUA will remain  in effect (meaning this test can be used) for the duration of the COVID-19 declaration under Section  564(b)(1) of the Act, 21 U.S.C.section 360bbb-3(b)(1), unless the authorization is terminated  or revoked sooner.       Influenza A by PCR NEGATIVE NEGATIVE Final   Influenza B by PCR NEGATIVE NEGATIVE Final    Comment: (NOTE) The Xpert Xpress SARS-CoV-2/FLU/RSV plus assay is intended as an aid in the diagnosis of influenza from Nasopharyngeal swab specimens and should not be used as a sole basis for treatment. Nasal washings and aspirates are unacceptable for Xpert Xpress SARS-CoV-2/FLU/RSV testing.  Fact Sheet for Patients: EntrepreneurPulse.com.au  Fact Sheet for Healthcare Providers: IncredibleEmployment.be  This test is not yet approved or cleared by the [place] FDA and has been authorized for detection and/or diagnosis of SARS-CoV-2 by FDA under an Emergency Use Authorization (EUA). This EUA will  remain in effect (meaning this test can be used) for the duration of the COVID-19 declaration under Section 564(b)(1) of the Act, 21 U.S.C. section 360bbb-3(b)(1), unless the authorization is terminated or revoked.  Performed at [facility], [address] [place], [place] [address]   MRSA Next Gen by PCR, Nasal     Status: None   Collection Time: 02/16/21  7:00 AM   Specimen: Nasal Mucosa; Nasal Swab  Result Value Ref Range Status   MRSA by PCR Next Gen NOT DETECTED NOT DETECTED Final    Comment: (NOTE) The GeneXpert MRSA Assay (FDA approved for NASAL specimens only), is one component of a comprehensive MRSA colonization surveillance program. It is not intended to diagnose MRSA infection nor to guide or monitor treatment for MRSA infections. Test performance is not FDA approved in patients less than 49 years old. Performed at [facility], [address] [place],  [address]       Radiology Studies: DG Chest Portable 1 View  Result Date: 02/15/2021 CLINICAL DATA:  Short of breath and leg swelling and weakness EXAM: PORTABLE CHEST 1 VIEW COMPARISON:  01/10/2021 FINDINGS: Cardiac enlargement. AICD unchanged in position. Mild vascular congestion. Negative for edema or effusion. Mild bibasilar atelectasis. IMPRESSION: Mild pulmonary vascular congestion without edema. Mild bibasilar atelectasis. Marked cardiomegaly. Electronically Signed   By: Franchot Gallo M.D.   On: 02/15/2021 10:42   ECHOCARDIOGRAM COMPLETE  Result Date: 02/16/2021    ECHOCARDIOGRAM REPORT   Patient Name:   Maria Neal Date of Exam: 02/15/2021 Medical Rec #:  [id number]    Height:       65.7 in Accession #:    [id number]   Weight:       161.0 lb Date of Birth:  10/20/1950    BSA:          1.819 m Patient Age:    16 years     BP:           112/54 mmHg Patient Gender: F            HR:           73 bpm. Exam Location:  [facility] Procedure: 2D Echo, Cardiac Doppler and Color Doppler Indications:      CHF-acute systolic P10.25  History:         Patient has prior history of Echocardiogram examinations, most                  recent 08/10/2020. CAD, AICD, COPD; Risk Factors:Hypertension.  Sonographer:     Sherrie Sport Referring Phys:  [place] Diagnosing Phys: Serafina Royals MD  Sonographer Comments: Suboptimal apical window. IMPRESSIONS  1. Left ventricular ejection  fraction, by estimation, is <20%. The left ventricle has severely decreased function. The left ventricle demonstrates global hypokinesis. The left ventricular internal cavity size was severely dilated. There is mild left ventricular hypertrophy. Left ventricular diastolic parameters are consistent with Grade II diastolic dysfunction (pseudonormalization).  2. Right ventricular systolic function is mildly reduced. The right ventricular size is moderately enlarged.  3. Left atrial size was mildly dilated.  4. Right atrial size was mildly dilated.  5. The mitral valve is normal in structure. Severe mitral valve regurgitation.  6. Tricuspid valve regurgitation is moderate.  7. The aortic valve is normal in structure. Aortic valve regurgitation is trivial. FINDINGS  Left Ventricle: Left ventricular ejection fraction, by estimation, is <20%. The left ventricle has severely decreased function. The left ventricle demonstrates global hypokinesis. The left ventricular internal cavity size was severely dilated. There is mild left ventricular hypertrophy. Left ventricular diastolic parameters are consistent with Grade II diastolic dysfunction (pseudonormalization). Right Ventricle: The right ventricular size is moderately enlarged. No increase in right ventricular wall thickness. Right ventricular systolic function is mildly reduced. Left Atrium: Left atrial size was mildly dilated. Right Atrium: Right atrial size was mildly dilated. Pericardium: There is no evidence of pericardial effusion. Mitral Valve: The mitral valve is normal in structure. Severe  mitral valve regurgitation. MV peak gradient, 6.0 mmHg. The mean mitral valve gradient is 2.0 mmHg. Tricuspid Valve: The tricuspid valve is normal in structure. Tricuspid valve regurgitation is moderate. Aortic Valve: The aortic valve is normal in structure. Aortic valve regurgitation is trivial. Aortic regurgitation PHT measures 394 msec. Aortic valve mean gradient measures 3.7 mmHg. Aortic valve peak gradient measures 6.7 mmHg. Aortic valve area, by VTI measures 1.50 cm. Pulmonic Valve: The pulmonic valve was normal in structure. Pulmonic valve regurgitation is trivial. Aorta: The aortic root and ascending aorta are structurally normal, with no evidence of dilitation. IAS/Shunts: No atrial level shunt detected by color flow Doppler.  LEFT VENTRICLE PLAX 2D LVIDd:         7.10 cm      Diastology LVIDs:         6.70 cm      LV e' medial:    6.20 cm/s LV PW:         1.10 cm      LV E/e' medial:  14.6 LV IVS:        0.80 cm      LV e' lateral:   7.83 cm/s LVOT diam:     2.00 cm      LV E/e' lateral: 11.6 LV SV:         30 LV SV Index:   17 LVOT Area:     3.14 cm  LV Volumes (MOD) LV vol d, MOD A2C: 221.0 ml LV vol d, MOD A4C: 258.0 ml LV vol s, MOD A2C: 99.7 ml LV vol s, MOD A4C: 179.0 ml LV SV MOD A2C:     121.3 ml LV SV MOD A4C:     258.0 ml LV SV MOD BP:      105.8 ml RIGHT VENTRICLE RV Basal diam:  6.60 cm RV S prime:     9.46 cm/s TAPSE (M-mode): 2.3 cm LEFT ATRIUM              Index        RIGHT ATRIUM           Index LA diam:        6.10 cm  3.35 cm/m  RA Area:     29.60 cm LA Vol (A2C):   170.0 ml 93.46 ml/m  RA Volume:   109.00 ml 59.92 ml/m LA Vol (A4C):   163.0 ml 89.61 ml/m LA Biplane Vol: 173.0 ml 95.11 ml/m  AORTIC VALVE AV Area (Vmax):    1.16 cm AV Area (Vmean):   1.21 cm AV Area (VTI):     1.50 cm AV Vmax:           129.33 cm/s AV Vmean:          86.467 cm/s AV VTI:            0.202 m AV Peak Grad:      6.7 mmHg AV Mean Grad:      3.7 mmHg LVOT Vmax:         47.60 cm/s LVOT Vmean:         33.400 cm/s LVOT VTI:          0.097 m LVOT/AV VTI ratio: 0.48 AI PHT:            394 msec  AORTA Ao Root diam: 2.90 cm MITRAL VALVE               TRICUSPID VALVE MV Area (PHT): 3.77 cm    TR Peak grad:   40.4 mmHg MV Area VTI:   1.36 cm    TR Vmax:        318.00 cm/s MV Peak grad:  6.0 mmHg MV Mean grad:  2.0 mmHg    SHUNTS MV Vmax:       1.22 m/s    Systemic VTI:  0.10 m MV Vmean:      67.0 cm/s   Systemic Diam: 2.00 cm MV Decel Time: 201 msec MV E velocity: 90.80 cm/s MV A velocity: 44.60 cm/s MV E/A ratio:  2.04 Serafina Royals MD Electronically signed by Serafina Royals MD Signature Date/Time: 02/16/2021/8:07:09 AM    Final      Scheduled Meds:  amiodarone  100 mg Oral Daily   calcitRIOL  0.25 mcg Oral Daily   carvedilol  3.125 mg Oral BID WC   fluticasone  2 spray Each Nare Daily   fluticasone furoate-vilanterol  1 puff Inhalation Daily   And   umeclidinium bromide  1 puff Inhalation Daily   furosemide  40 mg Intravenous BID   gabapentin  100 mg Oral QHS   heparin  5,000 Units Subcutaneous Q8H   iron polysaccharides  150 mg Oral Daily   levothyroxine  175 mcg Oral Q0600   midodrine  10 mg Oral TID WC   pantoprazole  40 mg Oral Daily   spironolactone  12.5 mg Oral Daily   sucralfate  1 g Oral QID   cyanocobalamin  1,000 mcg Oral Daily   Continuous Infusions:   LOS: 1 day     Enzo Bi, MD [facility] If 7PM-7AM, please contact night-coverage 02/16/2021, 3:38 PM

## 2021-02-17 ENCOUNTER — Ambulatory Visit: Payer: Medicare Other

## 2021-02-17 ENCOUNTER — Ambulatory Visit: Admission: RE | Admit: 2021-02-17 | Payer: Medicare Other | Source: Ambulatory Visit

## 2021-02-17 LAB — CBC
HCT: 31.2 % — ABNORMAL LOW (ref 36.0–46.0)
Hemoglobin: 9 g/dL — ABNORMAL LOW (ref 12.0–15.0)
MCH: 25.5 pg — ABNORMAL LOW (ref 26.0–34.0)
MCHC: 28.8 g/dL — ABNORMAL LOW (ref 30.0–36.0)
MCV: 88.4 fL (ref 80.0–100.0)
Platelets: 151 10*3/uL (ref 150–400)
RBC: 3.53 MIL/uL — ABNORMAL LOW (ref 3.87–5.11)
RDW: 21.5 % — ABNORMAL HIGH (ref 11.5–15.5)
WBC: 4.4 10*3/uL (ref 4.0–10.5)
nRBC: 3.4 % — ABNORMAL HIGH (ref 0.0–0.2)

## 2021-02-17 LAB — BASIC METABOLIC PANEL
Anion gap: 10 (ref 5–15)
BUN: 70 mg/dL — ABNORMAL HIGH (ref 8–23)
CO2: 31 mmol/L (ref 22–32)
Calcium: 9 mg/dL (ref 8.9–10.3)
Chloride: 94 mmol/L — ABNORMAL LOW (ref 98–111)
Creatinine, Ser: 2.3 mg/dL — ABNORMAL HIGH (ref 0.44–1.00)
GFR, Estimated: 22 mL/min — ABNORMAL LOW (ref >60)
Glucose, Bld: 95 mg/dL (ref 70–99)
Potassium: 4.2 mmol/L (ref 3.5–5.1)
Sodium: 135 mmol/L (ref 135–145)

## 2021-02-17 LAB — MAGNESIUM: Magnesium: 2.1 mg/dL (ref 1.7–2.4)

## 2021-02-17 NOTE — Progress Notes (Signed)
PROGRESS NOTE    Maria Neal  SWF:[id number] DOB: July 31, 1950 DOA: 02/15/2021 PCP: Jon Billings, NP  [phone]   Assessment & Plan:   Principal Problem:   Acute exacerbation of CHF (congestive heart failure) (HCC)   Maria Neal is a 71 y.o. female with a past medical history of congestive heart failure with reduced EF of 20-25% based off ECHO on 8/22 status post AICD, chronic atrial fibrillation not on chronic anticoagulation due to history of GI bleed, COPD with chronic hypoxic respiratory failure on 3L oxygen at baseline, hypothyroidism, chronic hypotension on midodrine.   This patients presented to the ER from home with progressing shortness of breath and worsening lower extremity edema. She reportedly has gained 6 lbs over the past 5 days.  She was on 4 L and dropped to the mid 80s via pulse ox when she ambulated to the bedside commode.    Acute on chronic combined CHF exacerbation:  Severe mitral regurg Reduced ejection fraction 20 to 25% based off echo on 08/2020. Status post AICD.   --current Echo showed LVEF worsened to <20%.   --BNP is markedly elevated to 3624, chest x-ray with pulmonary vascular congestion.  --Started scheduled IV diuresis with Lasix 40 twice daily.   --cardio consulted Plan: --cont IV lasix 40 mg BID today, per cardio --cont coreg (dose decreased) and spironolactone   Acute on chronic hypoxic respiratory failure:  3L home O2 at baseline On 4L currently.   --Continue supplemental O2 to keep sats between 88-92%    Elevated high-sensitivity troponins 2/2 demand ischemia  --trop 120's. Denies any chest pain.    Chronic hypotension:  On midodrine 5 mg 3 times daily home.  --cont midodrine at increased 10 mg TID   Chronic kidney disease stage IV: Appears at baseline.  Monitor closely. Baseline creatinine appears to be around 1.7-2.1.  Continue home Calcitrol   Chronic atrial fibrillation: Not on chronic anticoagulation.   --on home digoxin,  amiodarone and Coreg.   Plan: --d/c digoxin, per cardio --cont amiodarone and coreg (dose decreased)  COPD: Not in acute exacerbation.  Continue home Trelegy inhaler as Breo and Incruse  Ventolin HFA inhaler as needed.    Peripheral neuropathy: Continue home gabapentin   Anemia of chronic renal disease:  Iron def --cont home iron supplement   GERD: Continue home Protonix and Carafate   Hypothyroidism: Continue home levothyroxine   DVT prophylaxis: Heparin SQ Code Status: Full code  Family Communication:  Level of care: Progressive Dispo:   The patient is from: home Anticipated d/c is to: home Anticipated d/c date is: 1-2 days Patient currently is not medically ready to d/c due to: on IV lasix, cardiology to clear   Subjective and Interval History:  Pt thought she had good urine output.  No dyspnea.   Objective: Vitals:   02/17/21 0813 02/17/21 1111 02/17/21 1627 02/17/21 1944  BP: (!) 115/47 (!) 105/49 (!) 110/51 112/60  Pulse: 70 69 72 71  Resp: 18 18 17 20   Temp: 98 F (36.7 C) 98 F (36.7 C) 98.2 F (36.8 C) (!) 97.5 F (36.4 C)  TempSrc: Oral  Oral   SpO2: 98% 91% 96% 93%  Weight:      Height:        Intake/Output Summary (Last 24 hours) at 02/17/2021 1948 Last data filed at 02/17/2021 1455 Gross per 24 hour  Intake 360 ml  Output 1400 ml  Net -1040 ml   Filed Weights   02/15/21 1534 02/16/21  9833 02/17/21 0414  Weight: 77.4 kg 78.6 kg 79.3 kg    Examination:   Constitutional: NAD, AAOx3 HEENT: conjunctivae and lids normal, EOMI CV: No cyanosis.   RESP: normal respiratory effort, [place] hanging off her nostrils.   Extremities: edema in BLE mildly improved SKIN: warm, dry Neuro: II - XII grossly intact.   Psych: Normal mood and affect.     Data Reviewed: I have personally reviewed following labs and imaging studies  CBC: Recent Labs  Lab 02/15/21 1055 02/16/21 0557 02/17/21 0444  WBC 4.2 4.3 4.4  HGB 9.8* 8.8* 9.0*  HCT 34.9* 31.1*  31.2*  MCV 91.6 89.6 88.4  PLT 154 138* 825   Basic Metabolic Panel: Recent Labs  Lab 02/15/21 1055 02/16/21 0557 02/17/21 0444  NA 136 137 135  K 4.1 3.7 4.2  CL 96* 94* 94*  CO2 29 33* 31  GLUCOSE 94 83 95  BUN 66* 67* 70*  CREATININE 2.19* 2.18* 2.30*  CALCIUM 9.0 8.9 9.0  MG  --   --  2.1   GFR: Estimated Creatinine Clearance: 23.2 mL/min (A) (by C-G formula based on SCr of 2.3 mg/dL (H)). Liver Function Tests: Recent Labs  Lab 02/15/21 1056  AST 25  ALT 21  ALKPHOS 91  BILITOT 1.9*  PROT 6.6  ALBUMIN 3.0*   No results for input(s): LIPASE, AMYLASE in the last 168 hours. No results for input(s): AMMONIA in the last 168 hours. Coagulation Profile: No results for input(s): INR, PROTIME in the last 168 hours. Cardiac Enzymes: No results for input(s): CKTOTAL, CKMB, CKMBINDEX, TROPONINI in the last 168 hours. BNP (last 3 results) No results for input(s): PROBNP in the last 8760 hours. HbA1C: No results for input(s): HGBA1C in the last 72 hours. CBG: No results for input(s): GLUCAP in the last 168 hours. Lipid Profile: No results for input(s): CHOL, HDL, LDLCALC, TRIG, CHOLHDL, LDLDIRECT in the last 72 hours. Thyroid Function Tests: No results for input(s): TSH, T4TOTAL, FREET4, T3FREE, THYROIDAB in the last 72 hours. Anemia Panel: No results for input(s): VITAMINB12, FOLATE, FERRITIN, TIBC, IRON, RETICCTPCT in the last 72 hours. Sepsis Labs: No results for input(s): PROCALCITON, LATICACIDVEN in the last 168 hours.  Recent Results (from the past 240 hour(s))  Resp Panel by RT-PCR (Flu A&B, Covid) Nasopharyngeal Swab     Status: None   Collection Time: 02/15/21 10:56 AM   Specimen: Nasopharyngeal Swab; Nasopharyngeal(NP) swabs in vial transport medium  Result Value Ref Range Status   SARS Coronavirus 2 by RT PCR NEGATIVE NEGATIVE Final    Comment: (NOTE) SARS-CoV-2 target nucleic acids are NOT DETECTED.  The SARS-CoV-2 RNA is generally detectable in upper  respiratory specimens during the acute phase of infection. The lowest concentration of SARS-CoV-2 viral copies this assay can detect is 138 copies/mL. A negative result does not preclude SARS-Cov-2 infection and should not be used as the sole basis for treatment or other patient management decisions. A negative result may occur with  improper specimen collection/handling, submission of specimen other than nasopharyngeal swab, presence of viral mutation(s) within the areas targeted by this assay, and inadequate number of viral copies(<138 copies/mL). A negative result must be combined with clinical observations, patient history, and epidemiological information. The expected result is Negative.  Fact Sheet for Patients:  EntrepreneurPulse.com.au  Fact Sheet for Healthcare Providers:  IncredibleEmployment.be  This test is no t yet approved or cleared by the [place] FDA and  has been authorized for detection and/or diagnosis of SARS-CoV-2  by FDA under an Emergency Use Authorization (EUA). This EUA will remain  in effect (meaning this test can be used) for the duration of the COVID-19 declaration under Section 564(b)(1) of the Act, 21 U.S.C.section 360bbb-3(b)(1), unless the authorization is terminated  or revoked sooner.       Influenza A by PCR NEGATIVE NEGATIVE Final   Influenza B by PCR NEGATIVE NEGATIVE Final    Comment: (NOTE) The Xpert Xpress SARS-CoV-2/FLU/RSV plus assay is intended as an aid in the diagnosis of influenza from Nasopharyngeal swab specimens and should not be used as a sole basis for treatment. Nasal washings and aspirates are unacceptable for Xpert Xpress SARS-CoV-2/FLU/RSV testing.  Fact Sheet for Patients: EntrepreneurPulse.com.au  Fact Sheet for Healthcare Providers: IncredibleEmployment.be  This test is not yet approved or cleared by the [place] FDA and has been  authorized for detection and/or diagnosis of SARS-CoV-2 by FDA under an Emergency Use Authorization (EUA). This EUA will remain in effect (meaning this test can be used) for the duration of the COVID-19 declaration under Section 564(b)(1) of the Act, 21 U.S.C. section 360bbb-3(b)(1), unless the authorization is terminated or revoked.  Performed at [facility] [address] [place], [place] [address]   MRSA Next Gen by PCR, Nasal     Status: None   Collection Time: 02/16/21  7:00 AM   Specimen: Nasal Mucosa; Nasal Swab  Result Value Ref Range Status   MRSA by PCR Next Gen NOT DETECTED NOT DETECTED Final    Comment: (NOTE) The GeneXpert MRSA Assay (FDA approved for NASAL specimens only), is one component of a comprehensive MRSA colonization surveillance program. It is not intended to diagnose MRSA infection nor to guide or monitor treatment for MRSA infections. Test performance is not FDA approved in patients less than 41 years old. Performed at [facility], [address] [place],  [address]       Radiology Studies: No results found.   Scheduled Meds:  amiodarone  100 mg Oral Daily   calcitRIOL  0.25 mcg Oral Daily   carvedilol  3.125 mg Oral BID WC   fluticasone  2 spray Each Nare Daily   fluticasone furoate-vilanterol  1 puff Inhalation Daily   And   umeclidinium bromide  1 puff Inhalation Daily   furosemide  40 mg Intravenous BID   gabapentin  100 mg Oral QHS   heparin  5,000 Units Subcutaneous Q8H   iron polysaccharides  150 mg Oral Daily   levothyroxine  175 mcg Oral Q0600   midodrine  10 mg Oral TID WC   pantoprazole  40 mg Oral Daily   spironolactone  12.5 mg Oral Daily   sucralfate  1 g Oral QID   cyanocobalamin  1,000 mcg Oral Daily   Continuous Infusions:   LOS: 2 days     Enzo Bi, MD [facility] If 7PM-7AM, please contact night-coverage 02/17/2021, 7:48 PM

## 2021-02-17 NOTE — Progress Notes (Signed)
[place]inous NOTE       Patient ID: Maria Neal MRN: [id number] DOB/AGE: February 17, 1950 71 y.o.  Admit date: 02/15/2021 Referring Physician Dr. Imagene Sheller Primary Physician Jon Billings, NP Primary Cardiologist Dr. Saralyn Pilar Reason for Consultation CHF  HPI: The patient is a 71 year old female with a past medical history of nonischemic dilated cardiomyopathy, HFrEF (02/1420 LVEF <20%, severe MR, g2 DD compared to 25% 06/2019) s/p CRT-D 11/2014 with change out 09/2020, paroxysmal atrial fibrillation s/p cardioversion 08/2020 not on anticoagulation due to history of GI bleeding, COPD on 3 L at baseline, CKD 4 who presented to [facility] ED the morning of 02/15/2021 with progressively worsening shortness of breath and lower extremity edema.  Cardiology is consulted for assistance with her heart failure.   Interval history: -f overall patient has done much better today than yesterday.  She does have continued urine output with a total of 1185 output over this hospitalization.  She is tolerating intravenous furosemide.  It does appear that the patient may be back to baseline although has continued chronic kidney disease stage IV with a glomerular filtration rate of 22.  She remains with appropriate medication management for atrial fibrillation including amiodarone and tolerates well.  Blood pressure has been stable  Review of systems complete and found to be negative unless listed above   Past Medical History:  Diagnosis Date   Acute appendicitis 02/17/2018   AICD (automatic cardioverter/defibrillator) present    Anxiety state    Arrhythmia    atrial fibrillation   CAD (coronary artery disease)    Cardiomyopathy (HCC)    CHF (congestive heart failure) (HCC)    COPD (chronic obstructive pulmonary disease) ([place])    Dyspnea    Headache    History of kidney stones    History of shingles    Hypertension    Influenza A 11/04/2020   On home oxygen therapy    bedtime and prn    Restless leg syndrome     Past Surgical History:  Procedure Laterality Date   CARDIAC CATHETERIZATION  04/2014   Dr. Idelle Leech   CARDIAC CATHETERIZATION     CARDIAC DEFIBRILLATOR PLACEMENT     CARDIOVERSION N/A 08/11/2020   Procedure: CARDIOVERSION;  Surgeon: Dixie Dials, MD;  Location: [place];  Service: Cardiovascular;  Laterality: N/A;   CHOLECYSTECTOMY N/A 08/23/2016   Procedure: LAPAROSCOPIC CHOLECYSTECTOMY;  Surgeon: Clayburn Pert, MD;  Location: [facility];  Service: General;  Laterality: N/A;   COLONOSCOPY WITH PROPOFOL N/A 12/15/2020   Procedure: COLONOSCOPY WITH PROPOFOL;  Surgeon: Lin Landsman, MD;  Location: [place];  Service: Gastroenterology;  Laterality: N/A;   COLONOSCOPY WITH PROPOFOL N/A 12/16/2020   Procedure: COLONOSCOPY WITH PROPOFOL;  Surgeon: Lucilla Lame, MD;  Location: [facility] ENDOSCOPY;  Service: Endoscopy;  Laterality: N/A;   DILATATION & CURETTAGE/HYSTEROSCOPY WITH MYOSURE N/A 06/27/2018   Procedure: DILATATION & CURETTAGE/HYSTEROSCOPY WITH MYOSURE;  Surgeon: Malachy Mood, MD;  Location: [facility];  Service: Gynecology;  Laterality: N/A;   ESOPHAGOGASTRODUODENOSCOPY N/A 12/15/2020   Procedure: ESOPHAGOGASTRODUODENOSCOPY (EGD);  Surgeon: Lin Landsman, MD;  Location: [facility] ENDOSCOPY;  Service: Gastroenterology;  Laterality: N/A;   HERNIA REPAIR     LAPAROSCOPIC APPENDECTOMY N/A 02/17/2018   Procedure: APPENDECTOMY LAPAROSCOPIC;  Surgeon: Benjamine Sprague, DO;  Location: [facility];  Service: General;  Laterality: N/A;   PPM GENERATOR CHANGEOUT N/A 09/14/2020   Procedure: PPM GENERATOR CHANGEOUT;  Surgeon: Isaias Cowman, MD;  Location: [place] CV LAB;  Service: Cardiovascular;  Laterality: N/A;  TEE WITHOUT CARDIOVERSION N/A 08/10/2020   Procedure: TRANSESOPHAGEAL ECHOCARDIOGRAM (TEE);  Surgeon: Dixie Dials, MD;  Location: [facility] ENDOSCOPY;  Service: Cardiovascular;  Laterality: N/A;    Medications Prior to Admission  Medication  Sig Dispense Refill Last Dose   amiodarone (PACERONE) 200 MG tablet Take 1 tablet (200 mg total) by mouth daily. (Patient taking differently: Take 100 mg by mouth daily.) 30 tablet 3 02/14/2021   calcitRIOL (ROCALTROL) 0.25 MCG capsule Take 0.25 mcg by mouth daily.   02/14/2021 at 0930   carvedilol (COREG) 6.25 MG tablet Take 6.25 mg by mouth 2 (two) times daily.   02/14/2021 at 1930   digoxin (LANOXIN) 0.125 MG tablet Take 125 mcg by mouth daily.   02/14/2021 at 0930   fluticasone (FLONASE) 50 MCG/ACT nasal spray Place 2 sprays into both nostrils daily. 16 g 6 02/14/2021 at 1930   Fluticasone-Umeclidin-Vilant (TRELEGY ELLIPTA) 100-62.5-25 MCG/INH AEPB Inhale 1 puff into the lungs daily.   02/14/2021 at 1930   gabapentin (NEURONTIN) 100 MG capsule Take 1 capsule (100 mg total) by mouth at bedtime. 30 capsule 3 02/14/2021 at 1930   levothyroxine (SYNTHROID) 175 MCG tablet Take 1 tablet (175 mcg total) by mouth daily. 90 tablet 3 02/14/2021 at 0845   midodrine (PROAMATINE) 5 MG tablet TAKE 1 TABLET BY MOUTH 3 TIMES DAILY WITH MEALS 90 tablet 0 02/14/2021 at 1900   pantoprazole (PROTONIX) 40 MG tablet Take 1 tablet (40 mg total) by mouth daily. 90 tablet 1 02/14/2021 at 0930   spironolactone (ALDACTONE) 25 MG tablet Take 12.5 mg by mouth daily.   02/14/2021 at 1730   sucralfate (CARAFATE) 1 g tablet Take 1 tablet (1 g total) by mouth 4 (four) times daily. 40 tablet 0 02/14/2021 at 1900   torsemide (DEMADEX) 20 MG tablet Take 20 mg by mouth daily.   02/14/2021 at 0930   albuterol (VENTOLIN HFA) 108 (90 Base) MCG/ACT inhaler Inhale 2 puffs into the lungs every 6 (six) hours as needed for wheezing or shortness of breath. 18 g 1 prn   alum & mag hydroxide-simeth (MAALOX/MYLANTA) 200-200-20 MG/5ML suspension Take 30 mLs by mouth every 4 (four) hours as needed for indigestion or heartburn. (Patient not taking: Reported on 02/15/2021) 355 mL 0 Not Taking   iron polysaccharides (NIFEREX) 150 MG capsule Take 1 capsule (150 mg  total) by mouth daily. 30 capsule 0 02/13/2021   OXYGEN Inhale 3 L into the lungs 3 (three) times daily as needed (shortness of breath or coughing).      vitamin B-12 1000 MCG tablet Take 1 tablet (1,000 mcg total) by mouth daily. 30 tablet 2 02/13/2021    Social History   Socioeconomic History   Marital status: Divorced    Spouse name: Not on file   Number of children: Not on file   Years of education: Not on file   Highest education level: High school graduate  Occupational History   Occupation: retired  Tobacco Use   Smoking status: Former    Packs/day: 0.25    Types: Cigarettes   Smokeless tobacco: Never   Tobacco comments:    quit at age 73, whole pack lasted 3 weeks   Vaping Use   Vaping Use: Never used  Substance and Sexual Activity   Alcohol use: No    Alcohol/week: 0.0 standard drinks   Drug use: No   Sexual activity: Not Currently    Birth control/protection: None  Other Topics Concern   Not on file  Social History  Narrative   Not on file   Social Determinants of Health   Financial Resource Strain: Not on file  Food Insecurity: Not on file  Transportation Needs: Not on file  Physical Activity: Not on file  Stress: Not on file  Social Connections: Not on file  Intimate Partner Violence: Not on file    Family History  Problem Relation Age of Onset   Diabetes Mellitus II Mother    Hypertension Mother    Heart attack Father    Arthritis Sister    Arthritis Sister    Thyroid disease Brother    Heart disease Brother    Diabetes Brother    Depression Daughter    Depression Son    Schizophrenia Son    Breast cancer Neg Hx       Review of systems complete and found to be negative unless listed above    PHYSICAL EXAM General: Elderly Caucasian female, well nourished, in no acute distress.  Sitting upright with legs off PCU bed. HEENT:  Normocephalic and atraumatic.  Lips appear more pink today Neck:  No JVD.  Lungs: Normal respiratory effort on 3 L  by nasal cannula.  Poor air movement, bibasilar crackles. No wheezing. Heart: HRRR . Normal S1 and S2 without gallops. 3/6 holosystolic murmur. Radial & DP pulses 2+ bilaterally. Abdomen: Non-distended appearing.  Msk: Normal strength and tone for age. Extremities: Warm and well perfused. No clubbing.  1+ pitting bilateral lower extremity edema up to patient's mid thigh, improving Neuro: Alert and oriented X 3. Psych:  Answers questions appropriately.   Labs:   Lab Results  Component Value Date   WBC 4.4 02/17/2021   HGB 9.0 (L) 02/17/2021   HCT 31.2 (L) 02/17/2021   MCV 88.4 02/17/2021   PLT 151 02/17/2021    Recent Labs  Lab 02/15/21 1056 02/16/21 0557 02/17/21 0444  NA  --    < > 135  K  --    < > 4.2  CL  --    < > 94*  CO2  --    < > 31  BUN  --    < > 70*  CREATININE  --    < > 2.30*  CALCIUM  --    < > 9.0  PROT 6.6  --   --   BILITOT 1.9*  --   --   ALKPHOS 91  --   --   ALT 21  --   --   AST 25  --   --   GLUCOSE  --    < > 95   < > = values in this interval not displayed.    Lab Results  Component Value Date   TROPONINI <0.03 09/02/2014     Lab Results  Component Value Date   CHOL 124 09/23/2020   CHOL 202 (H) 03/10/2020   CHOL 81 (L) 03/14/2019   Lab Results  Component Value Date   HDL 51 09/23/2020   HDL 49 03/10/2020   HDL 41 03/14/2019   Lab Results  Component Value Date   LDLCALC 53 09/23/2020   LDLCALC 128 (H) 03/10/2020   LDLCALC 23 03/14/2019   Lab Results  Component Value Date   TRIG 113 09/23/2020   TRIG 140 03/10/2020   TRIG 80 03/14/2019   Lab Results  Component Value Date   CHOLHDL 2.4 09/23/2020   CHOLHDL 4.1 03/10/2020   No results found for: LDLDIRECT    Radiology: DG Chest Portable 1  View  Result Date: 02/15/2021 CLINICAL DATA:  Short of breath and leg swelling and weakness EXAM: PORTABLE CHEST 1 VIEW COMPARISON:  01/10/2021 FINDINGS: Cardiac enlargement. AICD unchanged in position. Mild vascular congestion.  Negative for edema or effusion. Mild bibasilar atelectasis. IMPRESSION: Mild pulmonary vascular congestion without edema. Mild bibasilar atelectasis. Marked cardiomegaly. Electronically Signed   By: Franchot Gallo M.D.   On: 02/15/2021 10:42   US BREAST LTD UNI LEFT INC AXILLA  Result Date: 02/02/2021 CLINICAL DATA:  Left breast asymmetry and calcifications seen on patient's new baseline mammogram. EXAM: DIGITAL DIAGNOSTIC UNILATERAL LEFT MAMMOGRAM WITH TOMOSYNTHESIS AND CAD; ULTRASOUND LEFT BREAST LIMITED TECHNIQUE: Left digital diagnostic mammography and breast tomosynthesis was performed. The images were evaluated with computer-aided detection.; Targeted ultrasound examination of the left breast was performed. COMPARISON:  Previous exam(s). ACR Breast Density Category c: The breast tissue is heterogeneously dense, which may obscure small masses. FINDINGS: Additional mammographic views of the left breast demonstrate loosely grouped punctate calcifications in the slightly lower outer left breast, middle and anterior depth. The area of calcifications spans 6.6 cm in anterior to posterior dimension. No associated masses are seen. Targeted left breast ultrasound is performed demonstrating 2 probably benign cysts at 2:30 o'clock 3 cm from the nipple measuring 0.6 x 0.4 x 0.5 cm and 0.5 x 0.5 x 0.5 cm. IMPRESSION: Loosely grouped calcifications in the left breast are mildly suspicious. Recommend 1 site stereotactic core needle biopsy of the anterior or posterior extent of involvement. Two probably benign left breast 2:30 o'clock masses. With benign pathology results, recommend six-month follow-up for the remaining of the calcifications and the probably benign left breast masses. RECOMMENDATION: One site stereotactic core needle biopsy of the left breast. I have discussed the findings and recommendations with the patient. If applicable, a reminder letter will be sent to the patient regarding the next appointment.  BI-RADS CATEGORY  4: Suspicious. Electronically Signed   By: Fidela Salisbury M.D.   On: 02/02/2021 11:12  MM DIAG BREAST TOMO UNI LEFT  Result Date: 02/02/2021 CLINICAL DATA:  Left breast asymmetry and calcifications seen on patient's new baseline mammogram. EXAM: DIGITAL DIAGNOSTIC UNILATERAL LEFT MAMMOGRAM WITH TOMOSYNTHESIS AND CAD; ULTRASOUND LEFT BREAST LIMITED TECHNIQUE: Left digital diagnostic mammography and breast tomosynthesis was performed. The images were evaluated with computer-aided detection.; Targeted ultrasound examination of the left breast was performed. COMPARISON:  Previous exam(s). ACR Breast Density Category c: The breast tissue is heterogeneously dense, which may obscure small masses. FINDINGS: Additional mammographic views of the left breast demonstrate loosely grouped punctate calcifications in the slightly lower outer left breast, middle and anterior depth. The area of calcifications spans 6.6 cm in anterior to posterior dimension. No associated masses are seen. Targeted left breast ultrasound is performed demonstrating 2 probably benign cysts at 2:30 o'clock 3 cm from the nipple measuring 0.6 x 0.4 x 0.5 cm and 0.5 x 0.5 x 0.5 cm. IMPRESSION: Loosely grouped calcifications in the left breast are mildly suspicious. Recommend 1 site stereotactic core needle biopsy of the anterior or posterior extent of involvement. Two probably benign left breast 2:30 o'clock masses. With benign pathology results, recommend six-month follow-up for the remaining of the calcifications and the probably benign left breast masses. RECOMMENDATION: One site stereotactic core needle biopsy of the left breast. I have discussed the findings and recommendations with the patient. If applicable, a reminder letter will be sent to the patient regarding the next appointment. BI-RADS CATEGORY  4: Suspicious. Electronically Signed   By: Thomas Hoff  Dimitrova M.D.   On: 02/02/2021 11:12  ECHOCARDIOGRAM  COMPLETE  Result Date: 02/16/2021    ECHOCARDIOGRAM REPORT   Patient Name:   Maria Neal Date of Exam: 02/15/2021 Medical Rec #:  [id number]    Height:       65.7 in Accession #:    [id number]   Weight:       161.0 lb Date of Birth:  06/17/1950    BSA:          1.819 m Patient Age:    75 years     BP:           112/54 mmHg Patient Gender: F            HR:           73 bpm. Exam Location:  [facility] Procedure: 2D Echo, Cardiac Doppler and Color Doppler Indications:     CHF-acute systolic Y09.98  History:         Patient has prior history of Echocardiogram examinations, most                  recent 08/10/2020. CAD, AICD, COPD; Risk Factors:Hypertension.  Sonographer:     Sherrie Sport Referring Phys:  [place] Diagnosing Phys: Serafina Royals MD  Sonographer Comments: Suboptimal apical window. IMPRESSIONS  1. Left ventricular ejection fraction, by estimation, is <20%. The left ventricle has severely decreased function. The left ventricle demonstrates global hypokinesis. The left ventricular internal cavity size was severely dilated. There is mild left ventricular hypertrophy. Left ventricular diastolic parameters are consistent with Grade II diastolic dysfunction (pseudonormalization).  2. Right ventricular systolic function is mildly reduced. The right ventricular size is moderately enlarged.  3. Left atrial size was mildly dilated.  4. Right atrial size was mildly dilated.  5. The mitral valve is normal in structure. Severe mitral valve regurgitation.  6. Tricuspid valve regurgitation is moderate.  7. The aortic valve is normal in structure. Aortic valve regurgitation is trivial. FINDINGS  Left Ventricle: Left ventricular ejection fraction, by estimation, is <20%. The left ventricle has severely decreased function. The left ventricle demonstrates global hypokinesis. The left ventricular internal cavity size was severely dilated. There is mild left ventricular hypertrophy. Left ventricular diastolic parameters  are consistent with Grade II diastolic dysfunction (pseudonormalization). Right Ventricle: The right ventricular size is moderately enlarged. No increase in right ventricular wall thickness. Right ventricular systolic function is mildly reduced. Left Atrium: Left atrial size was mildly dilated. Right Atrium: Right atrial size was mildly dilated. Pericardium: There is no evidence of pericardial effusion. Mitral Valve: The mitral valve is normal in structure. Severe mitral valve regurgitation. MV peak gradient, 6.0 mmHg. The mean mitral valve gradient is 2.0 mmHg. Tricuspid Valve: The tricuspid valve is normal in structure. Tricuspid valve regurgitation is moderate. Aortic Valve: The aortic valve is normal in structure. Aortic valve regurgitation is trivial. Aortic regurgitation PHT measures 394 msec. Aortic valve mean gradient measures 3.7 mmHg. Aortic valve peak gradient measures 6.7 mmHg. Aortic valve area, by VTI measures 1.50 cm. Pulmonic Valve: The pulmonic valve was normal in structure. Pulmonic valve regurgitation is trivial. Aorta: The aortic root and ascending aorta are structurally normal, with no evidence of dilitation. IAS/Shunts: No atrial level shunt detected by color flow Doppler.  LEFT VENTRICLE PLAX 2D LVIDd:         7.10 cm      Diastology LVIDs:         6.70 cm  LV e' medial:    6.20 cm/s LV PW:         1.10 cm      LV E/e' medial:  14.6 LV IVS:        0.80 cm      LV e' lateral:   7.83 cm/s LVOT diam:     2.00 cm      LV E/e' lateral: 11.6 LV SV:         30 LV SV Index:   17 LVOT Area:     3.14 cm  LV Volumes (MOD) LV vol d, MOD A2C: 221.0 ml LV vol d, MOD A4C: 258.0 ml LV vol s, MOD A2C: 99.7 ml LV vol s, MOD A4C: 179.0 ml LV SV MOD A2C:     121.3 ml LV SV MOD A4C:     258.0 ml LV SV MOD BP:      105.8 ml RIGHT VENTRICLE RV Basal diam:  6.60 cm RV S prime:     9.46 cm/s TAPSE (M-mode): 2.3 cm LEFT ATRIUM              Index        RIGHT ATRIUM           Index LA diam:        6.10 cm  3.35  cm/m   RA Area:     29.60 cm LA Vol (A2C):   170.0 ml 93.46 ml/m  RA Volume:   109.00 ml 59.92 ml/m LA Vol (A4C):   163.0 ml 89.61 ml/m LA Biplane Vol: 173.0 ml 95.11 ml/m  AORTIC VALVE AV Area (Vmax):    1.16 cm AV Area (Vmean):   1.21 cm AV Area (VTI):     1.50 cm AV Vmax:           129.33 cm/s AV Vmean:          86.467 cm/s AV VTI:            0.202 m AV Peak Grad:      6.7 mmHg AV Mean Grad:      3.7 mmHg LVOT Vmax:         47.60 cm/s LVOT Vmean:        33.400 cm/s LVOT VTI:          0.097 m LVOT/AV VTI ratio: 0.48 AI PHT:            394 msec  AORTA Ao Root diam: 2.90 cm MITRAL VALVE               TRICUSPID VALVE MV Area (PHT): 3.77 cm    TR Peak grad:   40.4 mmHg MV Area VTI:   1.36 cm    TR Vmax:        318.00 cm/s MV Peak grad:  6.0 mmHg MV Mean grad:  2.0 mmHg    SHUNTS MV Vmax:       1.22 m/s    Systemic VTI:  0.10 m MV Vmean:      67.0 cm/s   Systemic Diam: 2.00 cm MV Decel Time: 201 msec MV E velocity: 90.80 cm/s MV A velocity: 44.60 cm/s MV E/A ratio:  2.04 Serafina Royals MD Electronically signed by Serafina Royals MD Signature Date/Time: 02/16/2021/8:07:09 AM    Final     ECHO 2.14.23 - LVEF <20% with severely decreased LV fucntion, severe MR, g2 DD  03/8935 SEVERE LV SYSTOLIC DYSFUNCTION (See above)  MILD RV SYSTOLIC DYSFUNCTION (See above)  MODERATE  VALVULAR REGURGITATION (See above)  NO VALVULAR STENOSIS  MIODERATe MR, TR  MODERATE PHTN  MILD AR, PR  EF 25%   EKG reviewed by me: V paced 73 bpm   ASSESSMENT AND PLAN:  The patient is a 71 year old female with a past medical history of nonischemic dilated cardiomyopathy, HFrEF (02/1420 LVEF <20%, severe MR, g2 DD compared to 25% 06/2019) s/p CRT-D 11/2014 with change out 09/2020, chronic atrial fibrillation s/p cardioversion 08/2020 not on anticoagulation due to history of GI bleeding, COPD on 3 L at baseline, CKD 4 who presented to [facility] ED the morning of 02/15/2021 with progressively worsening shortness of breath and lower extremity  edema.  Cardiology is consulted for assistance with her heart failure.  #Acute on chronic HFrEF (02/1420 LVEF <20%, severe MR, g2 DD compared to 25% 06/2019) #Nonischemic dilated cardiomyopathy s/p CRT-D change out 09/2020 The patient reports worsening leg swelling that started yesterday.  She reports compliance with her medications, denies adding salt to foods.  She reports drinking 3 x 16 ounce bottles of water per day and 1 cup of juice.  Her BNP is markedly elevated to 3624, chest x-ray with pulmonary vascular congestion.  Troponin minimally elevated without significant delta likely 2/2 demand ischemia and not ACS. Per nursing reporting she gained 6 pounds in the past 5 days. Appeared volume overloaded on admission.  -S/p 40 mg IV Lasix.  Continue diuresis with 40 mg twice daily for today and likely transition back to PO torsemide at 40 mg each day tomorrow with possible discharge as well.  -Continue midodrine 10 mg 3 times daily -continue low dose Coreg 3.125 BID -Continue spironolactone 12.5 once daily -Recommend strict I's and O's, daily weights, salt and fluid restriction. -Echocardiogram resulted with EF <20%, severe MR, g2 DD. Will continue GDMT as above.  Home GDMT includes Coreg 6.25 twice daily, torsemide 20 mg once daily, spironolactone 12.5 mg once daily  #COPD on 3 L at baseline -Currently on her baseline oxygen requirement. -Management per primary team.  #Paroxysmal atrial fibrillation not on anticoagulation due to history of GI bleed Currently V paced, she was instructed at her last cardiology visit to follow-up with GI to determine when to restart her anticoagulation but has not done so yet.  Per her most recent primary care provider note 2 weeks ago, the patient has had multiple falls at home. -Continue home amiodarone 100 mg. Discontinuation of digoxin due to interaction with amiodarone and chronic kidney disease Continue carvedilol although very low-dose for cardiomyopathy for  which she appears to be tolerating fairly well  #CKD 4 Appears to be around baseline with creatinine 2.17, GFR 24 (1 month ago 1.7, GFR 30)   If patient's oxygenation has improved throughout today into tomorrow morning would consider the possibility of discharge to home with follow-up next week  Signed: Corey Skains , MD [facility] 02/17/2021, 2:55 PM [facility]

## 2021-02-17 NOTE — Progress Notes (Signed)
Mobility Specialist - Progress Note    02/17/21 1600  Mobility  Activity Stood at bedside;Transferred to/from BSC;Dangled on edge of bed  Level of Assistance Standby assist, set-up cues, supervision of patient - no hands on  Assistive Device Standard walker  Distance Ambulated (ft) 2 ft  Activity Response Tolerated well  $Mobility charge 1 Mobility    Pre-mobility: 70 HR, 94% SpO2 Post-mobility: 77 HR, 93 SPO2   Pt supine upon arrival utilizing 5L. Pt sat EOB with MinA + extra time. Pt completed STS MinA --- anterior lean corrected with vc and extra time. Pt transfer from B-BSC with MinA and very small BM. Pt transferred BSC-Chair and left in chair with alarm set, needs in reach and family at bedside.  Kathee Delton Mobility Specialist 02/17/21, 4:25 PM

## 2021-02-18 ENCOUNTER — Ambulatory Visit: Payer: Medicare Other | Admitting: Nurse Practitioner

## 2021-02-18 LAB — BASIC METABOLIC PANEL
Anion gap: 9 (ref 5–15)
BUN: 67 mg/dL — ABNORMAL HIGH (ref 8–23)
CO2: 33 mmol/L — ABNORMAL HIGH (ref 22–32)
Calcium: 9.2 mg/dL (ref 8.9–10.3)
Chloride: 94 mmol/L — ABNORMAL LOW (ref 98–111)
Creatinine, Ser: 2.21 mg/dL — ABNORMAL HIGH (ref 0.44–1.00)
GFR, Estimated: 23 mL/min — ABNORMAL LOW (ref >60)
Glucose, Bld: 88 mg/dL (ref 70–99)
Potassium: 4 mmol/L (ref 3.5–5.1)
Sodium: 136 mmol/L (ref 135–145)

## 2021-02-18 LAB — MAGNESIUM: Magnesium: 2 mg/dL (ref 1.7–2.4)

## 2021-02-18 LAB — CBC
HCT: 32.7 % — ABNORMAL LOW (ref 36.0–46.0)
Hemoglobin: 9.3 g/dL — ABNORMAL LOW (ref 12.0–15.0)
MCH: 25.5 pg — ABNORMAL LOW (ref 26.0–34.0)
MCHC: 28.4 g/dL — ABNORMAL LOW (ref 30.0–36.0)
MCV: 89.8 fL (ref 80.0–100.0)
Platelets: 152 10*3/uL (ref 150–400)
RBC: 3.64 MIL/uL — ABNORMAL LOW (ref 3.87–5.11)
RDW: 21.9 % — ABNORMAL HIGH (ref 11.5–15.5)
WBC: 4.8 10*3/uL (ref 4.0–10.5)
nRBC: 2.3 % — ABNORMAL HIGH (ref 0.0–0.2)

## 2021-02-18 NOTE — Progress Notes (Signed)
[place] NOTE       Patient ID: Maria Neal MRN: [id number] DOB/AGE: 71-Jul-1952 71 y.o.  Admit date: 02/15/2021 Referring Physician Dr. Imagene Sheller Primary Physician Jon Billings, NP Primary Cardiologist Dr. Saralyn Pilar Reason for Consultation CHF  HPI: The patient is a 71 year old female with a past medical history of nonischemic dilated cardiomyopathy, HFrEF (02/1420 LVEF <20%, severe MR, g2 DD compared to 25% 06/2019) s/p CRT-D 11/2014 with change out 09/2020, paroxysmal atrial fibrillation s/p cardioversion 08/2020 not on anticoagulation due to history of GI bleeding, COPD on 3 L at baseline, CKD 4 who presented to [facility] ED the morning of 02/15/2021 with progressively worsening shortness of breath and lower extremity edema.  Cardiology is consulted for assistance with her heart failure.   Interval history: -f overall patient has done much better today than yesterday.  She does have continued urine output with a total of 1425 output over this hospitalization.  She is tolerating intravenous furosemide.  It does appear that the patient may be back to baseline although has continued chronic kidney disease stage IV with a glomerular filtration rate of 22.  She remains with appropriate medication management for atrial fibrillation including amiodarone and tolerates well.  Blood pressure has been stable  Review of systems complete and found to be negative unless listed above   Past Medical History:  Diagnosis Date   Acute appendicitis 02/17/2018   AICD (automatic cardioverter/defibrillator) present    Anxiety state    Arrhythmia    atrial fibrillation   CAD (coronary artery disease)    Cardiomyopathy (HCC)    CHF (congestive heart failure) (HCC)    COPD (chronic obstructive pulmonary disease) ([place])    Dyspnea    Headache    History of kidney stones    History of shingles    Hypertension    Influenza A 11/04/2020   On home oxygen therapy    bedtime and prn    Restless leg syndrome     Past Surgical History:  Procedure Laterality Date   CARDIAC CATHETERIZATION  04/2014   Dr. Idelle Leech   CARDIAC CATHETERIZATION     CARDIAC DEFIBRILLATOR PLACEMENT     CARDIOVERSION N/A 08/11/2020   Procedure: CARDIOVERSION;  Surgeon: Dixie Dials, MD;  Location: [place];  Service: Cardiovascular;  Laterality: N/A;   CHOLECYSTECTOMY N/A 08/23/2016   Procedure: LAPAROSCOPIC CHOLECYSTECTOMY;  Surgeon: Clayburn Pert, MD;  Location: [facility] ORS;  Service: General;  Laterality: N/A;   COLONOSCOPY WITH PROPOFOL N/A 12/15/2020   Procedure: COLONOSCOPY WITH PROPOFOL;  Surgeon: Lin Landsman, MD;  Location: [place];  Service: Gastroenterology;  Laterality: N/A;   COLONOSCOPY WITH PROPOFOL N/A 12/16/2020   Procedure: COLONOSCOPY WITH PROPOFOL;  Surgeon: Lucilla Lame, MD;  Location: [facility] ENDOSCOPY;  Service: Endoscopy;  Laterality: N/A;   DILATATION & CURETTAGE/HYSTEROSCOPY WITH MYOSURE N/A 06/27/2018   Procedure: DILATATION & CURETTAGE/HYSTEROSCOPY WITH MYOSURE;  Surgeon: Malachy Mood, MD;  Location: [facility] ORS;  Service: Gynecology;  Laterality: N/A;   ESOPHAGOGASTRODUODENOSCOPY N/A 12/15/2020   Procedure: ESOPHAGOGASTRODUODENOSCOPY (EGD);  Surgeon: Lin Landsman, MD;  Location: [facility] ENDOSCOPY;  Service: Gastroenterology;  Laterality: N/A;   HERNIA REPAIR     LAPAROSCOPIC APPENDECTOMY N/A 02/17/2018   Procedure: APPENDECTOMY LAPAROSCOPIC;  Surgeon: Benjamine Sprague, DO;  Location: [facility] ORS;  Service: General;  Laterality: N/A;   PPM GENERATOR CHANGEOUT N/A 09/14/2020   Procedure: PPM GENERATOR CHANGEOUT;  Surgeon: Isaias Cowman, MD;  Location: [place] [facility];  Service: Cardiovascular;  Laterality: N/A;  TEE WITHOUT CARDIOVERSION N/A 08/10/2020   Procedure: TRANSESOPHAGEAL ECHOCARDIOGRAM (TEE);  Surgeon: Dixie Dials, MD;  Location: [facility] ENDOSCOPY;  Service: Cardiovascular;  Laterality: N/A;    Medications Prior to Admission  Medication  Sig Dispense Refill Last Dose   amiodarone (PACERONE) 200 MG tablet Take 1 tablet (200 mg total) by mouth daily. (Patient taking differently: Take 100 mg by mouth daily.) 30 tablet 3 02/14/2021   calcitRIOL (ROCALTROL) 0.25 MCG capsule Take 0.25 mcg by mouth daily.   02/14/2021 at 0930   carvedilol (COREG) 6.25 MG tablet Take 6.25 mg by mouth 2 (two) times daily.   02/14/2021 at 1930   digoxin (LANOXIN) 0.125 MG tablet Take 125 mcg by mouth daily.   02/14/2021 at 0930   fluticasone (FLONASE) 50 MCG/ACT nasal spray Place 2 sprays into both nostrils daily. 16 g 6 02/14/2021 at 1930   Fluticasone-Umeclidin-Vilant (TRELEGY ELLIPTA) 100-62.5-25 MCG/INH AEPB Inhale 1 puff into the lungs daily.   02/14/2021 at 1930   gabapentin (NEURONTIN) 100 MG capsule Take 1 capsule (100 mg total) by mouth at bedtime. 30 capsule 3 02/14/2021 at 1930   levothyroxine (SYNTHROID) 175 MCG tablet Take 1 tablet (175 mcg total) by mouth daily. 90 tablet 3 02/14/2021 at 0845   midodrine (PROAMATINE) 5 MG tablet TAKE 1 TABLET BY MOUTH 3 TIMES DAILY WITH MEALS 90 tablet 0 02/14/2021 at 1900   pantoprazole (PROTONIX) 40 MG tablet Take 1 tablet (40 mg total) by mouth daily. 90 tablet 1 02/14/2021 at 0930   spironolactone (ALDACTONE) 25 MG tablet Take 12.5 mg by mouth daily.   02/14/2021 at 1730   sucralfate (CARAFATE) 1 g tablet Take 1 tablet (1 g total) by mouth 4 (four) times daily. 40 tablet 0 02/14/2021 at 1900   torsemide (DEMADEX) 20 MG tablet Take 20 mg by mouth daily.   02/14/2021 at 0930   albuterol (VENTOLIN HFA) 108 (90 Base) MCG/ACT inhaler Inhale 2 puffs into the lungs every 6 (six) hours as needed for wheezing or shortness of breath. 18 g 1 prn   alum & mag hydroxide-simeth (MAALOX/MYLANTA) 200-200-20 MG/5ML suspension Take 30 mLs by mouth every 4 (four) hours as needed for indigestion or heartburn. (Patient not taking: Reported on 02/15/2021) 355 mL 0 Not Taking   iron polysaccharides (NIFEREX) 150 MG capsule Take 1 capsule (150 mg  total) by mouth daily. 30 capsule 0 02/13/2021   OXYGEN Inhale 3 L into the lungs 3 (three) times daily as needed (shortness of breath or coughing).      vitamin B-12 1000 MCG tablet Take 1 tablet (1,000 mcg total) by mouth daily. 30 tablet 2 02/13/2021    Social History   Socioeconomic History   Marital status: Divorced    Spouse name: Not on file   Number of children: Not on file   Years of education: Not on file   Highest education level: High school graduate  Occupational History   Occupation: retired  Tobacco Use   Smoking status: Former    Packs/day: 0.25    Types: Cigarettes   Smokeless tobacco: Never   Tobacco comments:    quit at age 63, whole pack lasted 3 weeks   Vaping Use   Vaping Use: Never used  Substance and Sexual Activity   Alcohol use: No    Alcohol/week: 0.0 standard drinks   Drug use: No   Sexual activity: Not Currently    Birth control/protection: None  Other Topics Concern   Not on file  Social History  Narrative   Not on file   Social Determinants of Health   Financial Resource Strain: Not on file  Food Insecurity: Not on file  Transportation Needs: Not on file  Physical Activity: Not on file  Stress: Not on file  Social Connections: Not on file  Intimate Partner Violence: Not on file    Family History  Problem Relation Age of Onset   Diabetes Mellitus II Mother    Hypertension Mother    Heart attack Father    Arthritis Sister    Arthritis Sister    Thyroid disease Brother    Heart disease Brother    Diabetes Brother    Depression Daughter    Depression Son    Schizophrenia Son    Breast cancer Neg Hx       Review of systems complete and found to be negative unless listed above    PHYSICAL EXAM General: Elderly Caucasian female, well nourished, in no acute distress.  Sitting upright with legs off PCU bed. HEENT:  Normocephalic and atraumatic.  Lips appear more pink today Neck:  No JVD.  Lungs: Normal respiratory effort on 3 L  by nasal cannula.  Poor air movement, bibasilar crackles. No wheezing. Heart: HRRR . Normal S1 and S2 without gallops. 3/6 holosystolic murmur. Radial & DP pulses 2+ bilaterally. Abdomen: Non-distended appearing.  Msk: Normal strength and tone for age. Extremities: Warm and well perfused. No clubbing.  1+ pitting bilateral lower extremity edema up to patient's mid thigh, improving Neuro: Alert and oriented X 3. Psych:  Answers questions appropriately.   Labs:   Lab Results  Component Value Date   WBC 4.8 02/18/2021   HGB 9.3 (L) 02/18/2021   HCT 32.7 (L) 02/18/2021   MCV 89.8 02/18/2021   PLT 152 02/18/2021    Recent Labs  Lab 02/15/21 1056 02/16/21 0557 02/18/21 0534  NA  --    < > 136  K  --    < > 4.0  CL  --    < > 94*  CO2  --    < > 33*  BUN  --    < > 67*  CREATININE  --    < > 2.21*  CALCIUM  --    < > 9.2  PROT 6.6  --   --   BILITOT 1.9*  --   --   ALKPHOS 91  --   --   ALT 21  --   --   AST 25  --   --   GLUCOSE  --    < > 88   < > = values in this interval not displayed.    Lab Results  Component Value Date   TROPONINI <0.03 09/02/2014     Lab Results  Component Value Date   CHOL 124 09/23/2020   CHOL 202 (H) 03/10/2020   CHOL 81 (L) 03/14/2019   Lab Results  Component Value Date   HDL 51 09/23/2020   HDL 49 03/10/2020   HDL 41 03/14/2019   Lab Results  Component Value Date   LDLCALC 53 09/23/2020   LDLCALC 128 (H) 03/10/2020   LDLCALC 23 03/14/2019   Lab Results  Component Value Date   TRIG 113 09/23/2020   TRIG 140 03/10/2020   TRIG 80 03/14/2019   Lab Results  Component Value Date   CHOLHDL 2.4 09/23/2020   CHOLHDL 4.1 03/10/2020   No results found for: LDLDIRECT    Radiology: DG Chest Portable 1  View  Result Date: 02/15/2021 CLINICAL DATA:  Short of breath and leg swelling and weakness EXAM: PORTABLE CHEST 1 VIEW COMPARISON:  01/10/2021 FINDINGS: Cardiac enlargement. AICD unchanged in position. Mild vascular congestion.  Negative for edema or effusion. Mild bibasilar atelectasis. IMPRESSION: Mild pulmonary vascular congestion without edema. Mild bibasilar atelectasis. Marked cardiomegaly. Electronically Signed   By: Franchot Gallo M.D.   On: 02/15/2021 10:42   US BREAST LTD UNI LEFT INC AXILLA  Result Date: 02/02/2021 CLINICAL DATA:  Left breast asymmetry and calcifications seen on patient's new baseline mammogram. EXAM: DIGITAL DIAGNOSTIC UNILATERAL LEFT MAMMOGRAM WITH TOMOSYNTHESIS AND CAD; ULTRASOUND LEFT BREAST LIMITED TECHNIQUE: Left digital diagnostic mammography and breast tomosynthesis was performed. The images were evaluated with computer-aided detection.; Targeted ultrasound examination of the left breast was performed. COMPARISON:  Previous exam(s). ACR Breast Density Category c: The breast tissue is heterogeneously dense, which may obscure small masses. FINDINGS: Additional mammographic views of the left breast demonstrate loosely grouped punctate calcifications in the slightly lower outer left breast, middle and anterior depth. The area of calcifications spans 6.6 cm in anterior to posterior dimension. No associated masses are seen. Targeted left breast ultrasound is performed demonstrating 2 probably benign cysts at 2:30 o'clock 3 cm from the nipple measuring 0.6 x 0.4 x 0.5 cm and 0.5 x 0.5 x 0.5 cm. IMPRESSION: Loosely grouped calcifications in the left breast are mildly suspicious. Recommend 1 site stereotactic core needle biopsy of the anterior or posterior extent of involvement. Two probably benign left breast 2:30 o'clock masses. With benign pathology results, recommend six-month follow-up for the remaining of the calcifications and the probably benign left breast masses. RECOMMENDATION: One site stereotactic core needle biopsy of the left breast. I have discussed the findings and recommendations with the patient. If applicable, a reminder letter will be sent to the patient regarding the next appointment.  BI-RADS CATEGORY  4: Suspicious. Electronically Signed   By: Fidela Salisbury M.D.   On: 02/02/2021 11:12  MM DIAG BREAST TOMO UNI LEFT  Result Date: 02/02/2021 CLINICAL DATA:  Left breast asymmetry and calcifications seen on patient's new baseline mammogram. EXAM: DIGITAL DIAGNOSTIC UNILATERAL LEFT MAMMOGRAM WITH TOMOSYNTHESIS AND CAD; ULTRASOUND LEFT BREAST LIMITED TECHNIQUE: Left digital diagnostic mammography and breast tomosynthesis was performed. The images were evaluated with computer-aided detection.; Targeted ultrasound examination of the left breast was performed. COMPARISON:  Previous exam(s). ACR Breast Density Category c: The breast tissue is heterogeneously dense, which may obscure small masses. FINDINGS: Additional mammographic views of the left breast demonstrate loosely grouped punctate calcifications in the slightly lower outer left breast, middle and anterior depth. The area of calcifications spans 6.6 cm in anterior to posterior dimension. No associated masses are seen. Targeted left breast ultrasound is performed demonstrating 2 probably benign cysts at 2:30 o'clock 3 cm from the nipple measuring 0.6 x 0.4 x 0.5 cm and 0.5 x 0.5 x 0.5 cm. IMPRESSION: Loosely grouped calcifications in the left breast are mildly suspicious. Recommend 1 site stereotactic core needle biopsy of the anterior or posterior extent of involvement. Two probably benign left breast 2:30 o'clock masses. With benign pathology results, recommend six-month follow-up for the remaining of the calcifications and the probably benign left breast masses. RECOMMENDATION: One site stereotactic core needle biopsy of the left breast. I have discussed the findings and recommendations with the patient. If applicable, a reminder letter will be sent to the patient regarding the next appointment. BI-RADS CATEGORY  4: Suspicious. Electronically Signed   By: Thomas Hoff  Dimitrova M.D.   On: 02/02/2021 11:12  ECHOCARDIOGRAM  COMPLETE  Result Date: 02/16/2021    ECHOCARDIOGRAM REPORT   Patient Name:   Maria Neal Date of Exam: 02/15/2021 Medical Rec #:  [id number]    Height:       65.7 in Accession #:    [id number]   Weight:       161.0 lb Date of Birth:  10/08/1950    BSA:          1.819 m Patient Age:    59 years     BP:           112/54 mmHg Patient Gender: F            HR:           73 bpm. Exam Location:  [facility] Procedure: 2D Echo, Cardiac Doppler and Color Doppler Indications:     CHF-acute systolic I09.73  History:         Patient has prior history of Echocardiogram examinations, most                  recent 08/10/2020. CAD, AICD, COPD; Risk Factors:Hypertension.  Sonographer:     Sherrie Sport Referring Phys:  [place] Diagnosing Phys: Serafina Royals MD  Sonographer Comments: Suboptimal apical window. IMPRESSIONS  1. Left ventricular ejection fraction, by estimation, is <20%. The left ventricle has severely decreased function. The left ventricle demonstrates global hypokinesis. The left ventricular internal cavity size was severely dilated. There is mild left ventricular hypertrophy. Left ventricular diastolic parameters are consistent with Grade II diastolic dysfunction (pseudonormalization).  2. Right ventricular systolic function is mildly reduced. The right ventricular size is moderately enlarged.  3. Left atrial size was mildly dilated.  4. Right atrial size was mildly dilated.  5. The mitral valve is normal in structure. Severe mitral valve regurgitation.  6. Tricuspid valve regurgitation is moderate.  7. The aortic valve is normal in structure. Aortic valve regurgitation is trivial. FINDINGS  Left Ventricle: Left ventricular ejection fraction, by estimation, is <20%. The left ventricle has severely decreased function. The left ventricle demonstrates global hypokinesis. The left ventricular internal cavity size was severely dilated. There is mild left ventricular hypertrophy. Left ventricular diastolic parameters  are consistent with Grade II diastolic dysfunction (pseudonormalization). Right Ventricle: The right ventricular size is moderately enlarged. No increase in right ventricular wall thickness. Right ventricular systolic function is mildly reduced. Left Atrium: Left atrial size was mildly dilated. Right Atrium: Right atrial size was mildly dilated. Pericardium: There is no evidence of pericardial effusion. Mitral Valve: The mitral valve is normal in structure. Severe mitral valve regurgitation. MV peak gradient, 6.0 mmHg. The mean mitral valve gradient is 2.0 mmHg. Tricuspid Valve: The tricuspid valve is normal in structure. Tricuspid valve regurgitation is moderate. Aortic Valve: The aortic valve is normal in structure. Aortic valve regurgitation is trivial. Aortic regurgitation PHT measures 394 msec. Aortic valve mean gradient measures 3.7 mmHg. Aortic valve peak gradient measures 6.7 mmHg. Aortic valve area, by VTI measures 1.50 cm. Pulmonic Valve: The pulmonic valve was normal in structure. Pulmonic valve regurgitation is trivial. Aorta: The aortic root and ascending aorta are structurally normal, with no evidence of dilitation. IAS/Shunts: No atrial level shunt detected by color flow Doppler.  LEFT VENTRICLE PLAX 2D LVIDd:         7.10 cm      Diastology LVIDs:         6.70 cm  LV e' medial:    6.20 cm/s LV PW:         1.10 cm      LV E/e' medial:  14.6 LV IVS:        0.80 cm      LV e' lateral:   7.83 cm/s LVOT diam:     2.00 cm      LV E/e' lateral: 11.6 LV SV:         30 LV SV Index:   17 LVOT Area:     3.14 cm  LV Volumes (MOD) LV vol d, MOD A2C: 221.0 ml LV vol d, MOD A4C: 258.0 ml LV vol s, MOD A2C: 99.7 ml LV vol s, MOD A4C: 179.0 ml LV SV MOD A2C:     121.3 ml LV SV MOD A4C:     258.0 ml LV SV MOD BP:      105.8 ml RIGHT VENTRICLE RV Basal diam:  6.60 cm RV S prime:     9.46 cm/s TAPSE (M-mode): 2.3 cm LEFT ATRIUM              Index        RIGHT ATRIUM           Index LA diam:        6.10 cm  3.35  cm/m   RA Area:     29.60 cm LA Vol (A2C):   170.0 ml 93.46 ml/m  RA Volume:   109.00 ml 59.92 ml/m LA Vol (A4C):   163.0 ml 89.61 ml/m LA Biplane Vol: 173.0 ml 95.11 ml/m  AORTIC VALVE AV Area (Vmax):    1.16 cm AV Area (Vmean):   1.21 cm AV Area (VTI):     1.50 cm AV Vmax:           129.33 cm/s AV Vmean:          86.467 cm/s AV VTI:            0.202 m AV Peak Grad:      6.7 mmHg AV Mean Grad:      3.7 mmHg LVOT Vmax:         47.60 cm/s LVOT Vmean:        33.400 cm/s LVOT VTI:          0.097 m LVOT/AV VTI ratio: 0.48 AI PHT:            394 msec  AORTA Ao Root diam: 2.90 cm MITRAL VALVE               TRICUSPID VALVE MV Area (PHT): 3.77 cm    TR Peak grad:   40.4 mmHg MV Area VTI:   1.36 cm    TR Vmax:        318.00 cm/s MV Peak grad:  6.0 mmHg MV Mean grad:  2.0 mmHg    SHUNTS MV Vmax:       1.22 m/s    Systemic VTI:  0.10 m MV Vmean:      67.0 cm/s   Systemic Diam: 2.00 cm MV Decel Time: 201 msec MV E velocity: 90.80 cm/s MV A velocity: 44.60 cm/s MV E/A ratio:  2.04 Serafina Royals MD Electronically signed by Serafina Royals MD Signature Date/Time: 02/16/2021/8:07:09 AM    Final     ECHO 2.14.23 - LVEF <20% with severely decreased LV fucntion, severe MR, g2 DD  07/4126 SEVERE LV SYSTOLIC DYSFUNCTION (See above)  MILD RV SYSTOLIC DYSFUNCTION (See above)  MODERATE  VALVULAR REGURGITATION (See above)  NO VALVULAR STENOSIS  MIODERATe MR, TR  MODERATE PHTN  MILD AR, PR  EF 25%   EKG reviewed by me: V paced 73 bpm   ASSESSMENT AND PLAN:  The patient is a 71 year old female with a past medical history of nonischemic dilated cardiomyopathy, HFrEF (02/1420 LVEF <20%, severe MR, g2 DD compared to 25% 06/2019) s/p CRT-D 11/2014 with change out 09/2020, chronic atrial fibrillation s/p cardioversion 08/2020 not on anticoagulation due to history of GI bleeding, COPD on 3 L at baseline, CKD 4 who presented to [facility] ED the morning of 02/15/2021 with progressively worsening shortness of breath and lower extremity  edema.  Cardiology is consulted for assistance with her heart failure.  #Acute on chronic HFrEF (02/1420 LVEF <20%, severe MR, g2 DD compared to 25% 06/2019) #Nonischemic dilated cardiomyopathy s/p CRT-D change out 09/2020 The patient reports worsening leg swelling that started yesterday.  She reports compliance with her medications, denies adding salt to foods.  She reports drinking 3 x 16 ounce bottles of water per day and 1 cup of juice.  Her BNP is markedly elevated to 3624, chest x-ray with pulmonary vascular congestion.  Troponin minimally elevated without significant delta likely 2/2 demand ischemia and not ACS. Per nursing reporting she gained 6 pounds in the past 5 days. Appeared volume overloaded on admission.  -S/p 40 mg IV Lasix.  Continue diuresis with 40 mg twice daily for today and likely transition back to PO torsemide at 40 mg each day tomorrow with possible discharge as well.  -Continue midodrine 10 mg 3 times daily -continue low dose Coreg 3.125 BID -Continue spironolactone 12.5 once daily -Recommend strict I's and O's, daily weights, salt and fluid restriction. -Echocardiogram resulted with EF <20%, severe MR, g2 DD. Will continue GDMT as above.  Home GDMT includes Coreg 6.25 twice daily, torsemide 20 mg once daily, spironolactone 12.5 mg once daily  #COPD on 3 L at baseline -Currently on her baseline oxygen requirement. -Management per primary team.  #Paroxysmal atrial fibrillation not on anticoagulation due to history of GI bleed Currently V paced, she was instructed at her last cardiology visit to follow-up with GI to determine when to restart her anticoagulation but has not done so yet.  Per her most recent primary care provider note 2 weeks ago, the patient has had multiple falls at home. -Continue home amiodarone 100 mg. Discontinuation of digoxin due to interaction with amiodarone and chronic kidney disease Continue carvedilol although very low-dose for cardiomyopathy for  which she appears to be tolerating fairly well  #CKD 4 Appears to be around baseline with creatinine 2.17, GFR 24 (1 month ago 1.7, GFR 30)   since patient's oxygenation has improved throughout today  would consider the possibility of discharge to home with follow-up next week  Signed: Corey Skains , MD [facility] 02/18/2021, 2:05 PM [facility] Cardiology

## 2021-02-18 NOTE — Care Management Important Message (Signed)
Important Message  Patient Details  Name: Maria Neal MRN: [id number] Date of Birth: 26-Jun-1950   Medicare Important Message Given:  Yes     Dannette Barbara 02/18/2021, 12:23 PM

## 2021-02-18 NOTE — Progress Notes (Signed)
Attempted to wean patient back to baseline home oxygen of 3L Maria Neal, deastted to 75% on 3L, Dr. Billie Ruddy made aware. Placed back on 5L [place] with oxygen saturation at 94%.

## 2021-02-18 NOTE — Progress Notes (Signed)
PROGRESS NOTE    Maria Neal  IPJ:[id number] DOB: January 12, 1950 DOA: 02/15/2021 PCP: Jon Billings, NP  [phone]   Assessment & Plan:   Principal Problem:   Acute exacerbation of CHF (congestive heart failure) (HCC)   Maria Neal is a 71 y.o. female with a past medical history of congestive heart failure with reduced EF of 20-25% based off ECHO on 8/22 status post AICD, chronic atrial fibrillation not on chronic anticoagulation due to history of GI bleed, COPD with chronic hypoxic respiratory failure on 3L oxygen at baseline, hypothyroidism, chronic hypotension on midodrine.   This patients presented to the ER from home with progressing shortness of breath and worsening lower extremity edema. She reportedly has gained 6 lbs over the past 5 days.  She was on 4 L and dropped to the mid 80s via pulse ox when she ambulated to the bedside commode.    Acute on chronic combined CHF exacerbation:  Severe mitral regurg Reduced ejection fraction 20 to 25% based off echo on 08/2020. Status post AICD.   --current Echo showed LVEF worsened to <20%.   --BNP is markedly elevated to 3624, chest x-ray with pulmonary vascular congestion.  --Started scheduled IV diuresis with Lasix 40 twice daily.   --cardio consulted Plan: --cont IV lasix 40 mg BID --cont coreg (dose decreased) and spironolactone   Acute on chronic hypoxic respiratory failure:  3L home O2 at baseline --on 5L, unable to wean down today  --Continue supplemental O2 to keep sats between 88-92%, wean as tolerated  Elevated high-sensitivity troponins 2/2 demand ischemia  --trop 120's. Denies any chest pain.    Chronic hypotension:  On midodrine 5 mg 3 times daily home.  --cont midodrine at increased 10 mg TID   Chronic kidney disease stage IV: Appears at baseline.  Monitor closely. Baseline creatinine appears to be around 1.7-2.1.  Continue home Calcitrol   Chronic atrial fibrillation: Not on chronic anticoagulation.   --on  home digoxin, amiodarone and Coreg.  Digoxin d/c'ed by cardio Plan: --cont amiodarone and coreg (dose decreased)  COPD: Not in acute exacerbation.  Continue home Trelegy inhaler as Breo and Incruse  Ventolin HFA inhaler as needed.    Peripheral neuropathy:  --cont home gabapentin   Anemia of chronic renal disease:  Iron def --cont home iron supplement   GERD:  Continue home Protonix and Carafate   Hypothyroidism:  --cont home Synthroid   DVT prophylaxis: Heparin SQ Code Status: Full code  Family Communication:  Level of care: Progressive Dispo:   The patient is from: home Anticipated d/c is to: home Anticipated d/c date is: 1-2 days Patient currently is not medically ready to d/c due to: on IV lasix, hypoxia not yet improved.   Subjective and Interval History:  When RN tried to wean oxygen, O2 sats dropped to 75% on 3L, so pt remained at 5L O2.  Pt again denied dyspnea, however, complained about [place] hurting her nostrils.     Objective: Vitals:   02/18/21 0759 02/18/21 0801 02/18/21 1117 02/18/21 1657  BP:  (!) 105/53 (!) 107/50 (!) 106/59  Pulse:  73 70 73  Resp:  19 16 17   Temp:  97.6 F (36.4 C) 97.7 F (36.5 C) 97.8 F (36.6 C)  TempSrc:    Oral  SpO2: (!) 75% 94% 98% 100%  Weight:      Height:        Intake/Output Summary (Last 24 hours) at 02/18/2021 1804 Last data filed at 02/18/2021 0900 Gross  per 24 hour  Intake --  Output 200 ml  Net -200 ml   Filed Weights   02/15/21 1534 02/16/21 0658 02/17/21 0414  Weight: 77.4 kg 78.6 kg 79.3 kg    Examination:   Constitutional: NAD, AAOx3, sitting in recliner HEENT: conjunctivae and lids normal, EOMI CV: No cyanosis.   RESP: normal respiratory effort, on 5L Extremities: No effusions, edema in BLE SKIN: warm, dry Neuro: II - XII grossly intact.   Psych: labile mood and affect.     Data Reviewed: I have personally reviewed following labs and imaging studies  CBC: Recent Labs  Lab 02/15/21 1055  02/16/21 0557 02/17/21 0444 02/18/21 0534  WBC 4.2 4.3 4.4 4.8  HGB 9.8* 8.8* 9.0* 9.3*  HCT 34.9* 31.1* 31.2* 32.7*  MCV 91.6 89.6 88.4 89.8  PLT 154 138* 151 737   Basic Metabolic Panel: Recent Labs  Lab 02/15/21 1055 02/16/21 0557 02/17/21 0444 02/18/21 0534  NA 136 137 135 136  K 4.1 3.7 4.2 4.0  CL 96* 94* 94* 94*  CO2 29 33* 31 33*  GLUCOSE 94 83 95 88  BUN 66* 67* 70* 67*  CREATININE 2.19* 2.18* 2.30* 2.21*  CALCIUM 9.0 8.9 9.0 9.2  MG  --   --  2.1 2.0   GFR: Estimated Creatinine Clearance: 24.1 mL/min (A) (by C-G formula based on SCr of 2.21 mg/dL (H)). Liver Function Tests: Recent Labs  Lab 02/15/21 1056  AST 25  ALT 21  ALKPHOS 91  BILITOT 1.9*  PROT 6.6  ALBUMIN 3.0*   No results for input(s): LIPASE, AMYLASE in the last 168 hours. No results for input(s): AMMONIA in the last 168 hours. Coagulation Profile: No results for input(s): INR, PROTIME in the last 168 hours. Cardiac Enzymes: No results for input(s): CKTOTAL, CKMB, CKMBINDEX, TROPONINI in the last 168 hours. BNP (last 3 results) No results for input(s): PROBNP in the last 8760 hours. HbA1C: No results for input(s): HGBA1C in the last 72 hours. CBG: No results for input(s): GLUCAP in the last 168 hours. Lipid Profile: No results for input(s): CHOL, HDL, LDLCALC, TRIG, CHOLHDL, LDLDIRECT in the last 72 hours. Thyroid Function Tests: No results for input(s): TSH, T4TOTAL, FREET4, T3FREE, THYROIDAB in the last 72 hours. Anemia Panel: No results for input(s): VITAMINB12, FOLATE, FERRITIN, TIBC, IRON, RETICCTPCT in the last 72 hours. Sepsis Labs: No results for input(s): PROCALCITON, LATICACIDVEN in the last 168 hours.  Recent Results (from the past 240 hour(s))  Resp Panel by RT-PCR (Flu A&B, Covid) Nasopharyngeal Swab     Status: None   Collection Time: 02/15/21 10:56 AM   Specimen: Nasopharyngeal Swab; Nasopharyngeal(NP) swabs in vial transport medium  Result Value Ref Range Status    SARS Coronavirus 2 by RT PCR NEGATIVE NEGATIVE Final    Comment: (NOTE) SARS-CoV-2 target nucleic acids are NOT DETECTED.  The SARS-CoV-2 RNA is generally detectable in upper respiratory specimens during the acute phase of infection. The lowest concentration of SARS-CoV-2 viral copies this assay can detect is 138 copies/mL. A negative result does not preclude SARS-Cov-2 infection and should not be used as the sole basis for treatment or other patient management decisions. A negative result may occur with  improper specimen collection/handling, submission of specimen other than nasopharyngeal swab, presence of viral mutation(s) within the areas targeted by this assay, and inadequate number of viral copies(<138 copies/mL). A negative result must be combined with clinical observations, patient history, and epidemiological information. The expected result is Negative.  Fact  Sheet for Patients:  EntrepreneurPulse.com.au  Fact Sheet for Healthcare Providers:  IncredibleEmployment.be  This test is no t yet approved or cleared by the [place] FDA and  has been authorized for detection and/or diagnosis of SARS-CoV-2 by FDA under an Emergency Use Authorization (EUA). This EUA will remain  in effect (meaning this test can be used) for the duration of the COVID-19 declaration under Section 564(b)(1) of the Act, 21 U.S.C.section 360bbb-3(b)(1), unless the authorization is terminated  or revoked sooner.       Influenza A by PCR NEGATIVE NEGATIVE Final   Influenza B by PCR NEGATIVE NEGATIVE Final    Comment: (NOTE) The Xpert Xpress SARS-CoV-2/FLU/RSV plus assay is intended as an aid in the diagnosis of influenza from Nasopharyngeal swab specimens and should not be used as a sole basis for treatment. Nasal washings and aspirates are unacceptable for Xpert Xpress SARS-CoV-2/FLU/RSV testing.  Fact Sheet for  Patients: EntrepreneurPulse.com.au  Fact Sheet for Healthcare Providers: IncredibleEmployment.be  This test is not yet approved or cleared by the [place] FDA and has been authorized for detection and/or diagnosis of SARS-CoV-2 by FDA under an Emergency Use Authorization (EUA). This EUA will remain in effect (meaning this test can be used) for the duration of the COVID-19 declaration under Section 564(b)(1) of the Act, 21 U.S.C. section 360bbb-3(b)(1), unless the authorization is terminated or revoked.  Performed at [facility], [address] [place], [place] [address]   MRSA Next Gen by PCR, Nasal     Status: None   Collection Time: 02/16/21  7:00 AM   Specimen: Nasal Mucosa; Nasal Swab  Result Value Ref Range Status   MRSA by PCR Next Gen NOT DETECTED NOT DETECTED Final    Comment: (NOTE) The GeneXpert MRSA Assay (FDA approved for NASAL specimens only), is one component of a comprehensive MRSA colonization surveillance program. It is not intended to diagnose MRSA infection nor to guide or monitor treatment for MRSA infections. Test performance is not FDA approved in patients less than 95 years old. Performed at [facility], [address] [place], [place] [address]       Radiology Studies: No results found.   Scheduled Meds:  amiodarone  100 mg Oral Daily   calcitRIOL  0.25 mcg Oral Daily   carvedilol  3.125 mg Oral BID WC   fluticasone  2 spray Each Nare Daily   fluticasone furoate-vilanterol  1 puff Inhalation Daily   And   umeclidinium bromide  1 puff Inhalation Daily   furosemide  40 mg Intravenous BID   gabapentin  100 mg Oral QHS   heparin  5,000 Units Subcutaneous Q8H   iron polysaccharides  150 mg Oral Daily   levothyroxine  175 mcg Oral Q0600   midodrine  10 mg Oral TID WC   pantoprazole  40 mg Oral Daily   spironolactone  12.5 mg Oral Daily   sucralfate  1 g Oral QID   cyanocobalamin   1,000 mcg Oral Daily   Continuous Infusions:   LOS: 3 days     Enzo Bi, MD [facility] If 7PM-7AM, please contact night-coverage 02/18/2021, 6:04 PM

## 2021-02-18 NOTE — Progress Notes (Signed)
Mobility Specialist - Progress Note   02/18/21 1400  Mobility  Activity Transferred to/from [facility]  Level of Assistance Standby assist, set-up cues, supervision of patient - no hands on  Assistive Device Standard walker  Distance Ambulated (ft) 5 ft  Activity Response Tolerated well  $Mobility charge 1 Mobility    Pt on BSC upon arrival using 5L. Pt has small BM and transfers BSC-C with supervision. No complaints voiced. Pt left with chair alarm set, needs in reach, family at bedside.   Kathee Delton Mobility Specialist 02/18/21, 2:52 PM

## 2021-02-19 LAB — BASIC METABOLIC PANEL
Anion gap: 12 (ref 5–15)
BUN: 71 mg/dL — ABNORMAL HIGH (ref 8–23)
CO2: 34 mmol/L — ABNORMAL HIGH (ref 22–32)
Calcium: 9.7 mg/dL (ref 8.9–10.3)
Chloride: 93 mmol/L — ABNORMAL LOW (ref 98–111)
Creatinine, Ser: 2.25 mg/dL — ABNORMAL HIGH (ref 0.44–1.00)
GFR, Estimated: 23 mL/min — ABNORMAL LOW (ref >60)
Glucose, Bld: 94 mg/dL (ref 70–99)
Potassium: 4.1 mmol/L (ref 3.5–5.1)
Sodium: 139 mmol/L (ref 135–145)

## 2021-02-19 LAB — CBC
HCT: 33.3 % — ABNORMAL LOW (ref 36.0–46.0)
Hemoglobin: 9.5 g/dL — ABNORMAL LOW (ref 12.0–15.0)
MCH: 25.1 pg — ABNORMAL LOW (ref 26.0–34.0)
MCHC: 28.5 g/dL — ABNORMAL LOW (ref 30.0–36.0)
MCV: 87.9 fL (ref 80.0–100.0)
Platelets: 146 10*3/uL — ABNORMAL LOW (ref 150–400)
RBC: 3.79 MIL/uL — ABNORMAL LOW (ref 3.87–5.11)
RDW: 21.6 % — ABNORMAL HIGH (ref 11.5–15.5)
WBC: 4.3 10*3/uL (ref 4.0–10.5)
nRBC: 2.3 % — ABNORMAL HIGH (ref 0.0–0.2)

## 2021-02-19 LAB — MAGNESIUM: Magnesium: 2 mg/dL (ref 1.7–2.4)

## 2021-02-19 MED ORDER — AMIODARONE HCL 200 MG PO TABS: 100.0000 mg | ORAL_TABLET | Freq: Every day | ORAL | Status: AC

## 2021-02-19 MED ORDER — CARVEDILOL 6.25 MG PO TABS: 3.1250 mg | ORAL_TABLET | Freq: Two times a day (BID) | ORAL | Status: AC

## 2021-02-19 MED ORDER — TORSEMIDE 20 MG PO TABS
40.0000 mg | ORAL_TABLET | Freq: Every day | ORAL | 2 refills | Status: AC
Start: 2021-02-19 — End: 2021-05-20

## 2021-02-19 MED ORDER — MIDODRINE HCL 10 MG PO TABS: 10.0000 mg | ORAL_TABLET | Freq: Three times a day (TID) | ORAL | 2 refills | Status: AC

## 2021-02-19 NOTE — TOC Transition Note (Signed)
Transition of Care [facility]) - CM/SW Discharge Note   Patient Details  Name: Maria Neal MRN: [id number] Date of Birth: June 12, 1950  Transition of Care [facility]) CM/SW Contact:  Harriet Masson, RN Phone Number: 02/19/2021, 12:25 PM   Clinical Narrative:    Pt will be discharged today with daughter transporting. Daughter [name] made aware that [facility] was notified that pt would be discharging home. Notified Corene Cornea with [facility] for ongoing services. No other needs presented.   TOC will remain available for any needs.   Final next level of care: [place] Barriers to Discharge: Barriers Resolved   Patient Goals and CMS Choice Patient states their goals for this hospitalization and ongoing recovery are:: to go home CMS Medicare.gov Compare Post Acute Care list provided to:: Patient Choice offered to / list presented to : Patient  Discharge Placement                  Name of family member notified: jennifer (daughter) Patient and family notified of of transfer: 02/19/21  Discharge Plan and Services                          HH Arranged: PT, OT, RN [facility] Agency: [place] ([place]) Date HH Agency Contacted: 02/19/21 Time [place]: 1223 Representative spoke with at [place]: Reminder to [place] pt will be d/c today. Pt already active with [facility].  Social Determinants of Health (SDOH) Interventions     Readmission Risk Interventions Readmission Risk Prevention Plan 02/16/2021 12/17/2020  Transportation Screening Complete Complete  PCP or Specialist Appt within 3-5 Days Complete Complete  HRI or Home Care Consult Complete Complete  Palliative Care Screening Not Applicable Not Applicable  Medication Review (RN Care Manager) Complete Referral to Pharmacy  Some recent data might be hidden

## 2021-02-19 NOTE — Progress Notes (Signed)
SATURATION QUALIFICATIONS: (This note is used to comply with regulatory documentation for home oxygen)  Patient Saturations on Room Air at Rest = 94%  Patient Saturations on Room Air while Ambulating = 81%  Patient Saturations on 3 Liters of oxygen while Ambulating = 90%

## 2021-02-19 NOTE — Progress Notes (Signed)
Mobility Specialist - Progress Note    02/19/21 1300  Mobility  Activity Refused mobility    Pt on BSC upon arrival utilizing 4L. Pt refused session due to upcoming discharge. Pt already changed and prepped. Left with needs in reach.

## 2021-02-19 NOTE — Discharge Summary (Signed)
Physician Discharge Summary   Maria Neal  female DOB: 09-Sep-1950  WUX:[id number]  PCP: Jon Billings, NP  Admit date: 02/15/2021 Discharge date: 02/19/2021  Admitted From: home Disposition:  home I have talked with daughter about care plan on the first day and last day with this pt.  On the day of discharge, daughter mentioned pt was falling a lot at home and was hoping for SNF placement, which was not mentioned to me or to other care providers during this hospitalization.  Pt had been ambulating with mobility specialist without needing hands-on assist, so would not qualify for SNF rehab anyways.  Pt is already active with HH, so HHPT ordered.  Home Health: Yes CODE STATUS: Full code  Discharge Instructions     Discharge instructions   Complete by: As directed    You have received IV Lasix to remove extra fluids, and is back on home 3 liters of supplemental oxygen.  Cardiologist made some changes to your medications, please take them as directed.   Dr. Enzo Bi [facility] Course:  For full details, please see H&P, progress notes, consult notes and ancillary notes.  Briefly,  Maria Neal is a 71 y.o. female with a past medical history of congestive heart failure with reduced EF of 20-25% status post AICD, chronic atrial fibrillation not on chronic anticoagulation due to history of GI bleed, COPD with chronic hypoxic respiratory failure on 3L oxygen at baseline, hypothyroidism, chronic hypotension on midodrine.   This patients presented to the ER from home with progressing shortness of breath and worsening lower extremity edema.  She reportedly has gained 6 lbs over the past 5 days.  She was on 4 L and dropped to the mid 80s via pulse ox when she ambulated to the bedside commode.    Acute on chronic combined CHF exacerbation:  Severe mitral regurg Reduced ejection fraction 20 to 25% based off echo on 08/2020. Status post AICD.   --current Echo showed LVEF  worsened to <20%.   --BNP is markedly elevated to 3624, chest x-ray with pulmonary vascular congestion.  --Pt received IV Lasix 40 twice daily during hospitalization. --cardio consulted --Home coreg dose reduced from 6.25 to 3.125 mg BID due to hypotension. --cont home spironolactone --pt was discharged on increased torsemide 40 mg daily, per cardio rec.   Acute on chronic hypoxic respiratory failure:  3L home O2 at baseline --O2 requirement 5L on presentation.  Prior to discharge, O2 requirement was down to home 3L with walking.   Elevated high-sensitivity troponins 2/2 demand ischemia  --trop 120's. Denies any chest pain.    Chronic hypotension:  On midodrine 5 mg 3 times daily PTA.  --midodrine increased to 10 mg TID   Chronic kidney disease stage IV: Appears at baseline.  Monitor closely. Baseline creatinine appears to be around 1.7-2.1.  Continue home Calcitrol   Chronic atrial fibrillation: Not on chronic anticoagulation.   --on home digoxin, amiodarone and Coreg PTA.   --Digoxin d/c'ed by cardio during this hospitalization. --cont amiodarone and coreg (dose decreased)   COPD: Not in acute exacerbation.  Continue home Trelegy inhaler as Breo and Incruse during hospitalization.  Ventolin HFA inhaler as needed.    Peripheral neuropathy:  --cont home gabapentin   Anemia of chronic renal disease:  Iron def --cont home iron supplement   GERD:  Continue home Protonix and Carafate   Hypothyroidism:  --cont home Synthroid   Discharge Diagnoses:  Principal Problem:   Acute exacerbation of CHF (congestive heart failure) ([place])   30 Day Unplanned Readmission Risk Score    Flowsheet Row ED to Hosp-Admission (Current) from 02/15/2021 in [place] [facility]  30 Day Unplanned Readmission Risk Score (%) 25.42 Filed at 02/19/2021 0800       This score is the patient's risk of an unplanned readmission within 30 days of being discharged (0 -100%). The score is  based on dignosis, age, lab data, medications, orders, and past utilization.   Low:  0-14.9   Medium: 15-21.9   High: 22-29.9   Extreme: 30 and above         Discharge Instructions:  Allergies as of 02/19/2021       Reactions   Levaquin [levofloxacin] Anaphylaxis   Amoxicillin Hives   Did it involve swelling of the face/tongue/throat, SOB, or low BP? No Did it involve sudden or severe rash/hives, skin peeling, or any reaction on the inside of your mouth or nose? No Did you need to seek medical attention at a hospital or doctor's office? No When did it last happen?      10+ years If all above answers are "NO", may proceed with cephalosporin use.   Nitrofuran Derivatives Other (See Comments)   Unknown   Penicillins Other (See Comments)   Thinks it made her itch a lot, but isn't sure. Did it involve swelling of the face/tongue/throat, SOB, or low BP? No Did it involve sudden or severe rash/hives, skin peeling, or any reaction on the inside of your mouth or nose? No Did you need to seek medical attention at a hospital or doctor's office? No When did it last happen?      10+ years If all above answers are "NO", may proceed with cephalosporin use.   Zithromax [azithromycin] Other (See Comments)   Antihistamines, Chlorpheniramine-type Rash        Medication List     STOP taking these medications    alum & mag hydroxide-simeth 200-200-20 MG/5ML suspension Commonly known as: MAALOX/MYLANTA   digoxin 0.125 MG tablet Commonly known as: LANOXIN       TAKE these medications    albuterol 108 (90 Base) MCG/ACT inhaler Commonly known as: VENTOLIN HFA Inhale 2 puffs into the lungs every 6 (six) hours as needed for wheezing or shortness of breath.   amiodarone 200 MG tablet Commonly known as: PACERONE Take 0.5 tablets (100 mg total) by mouth daily. Home med. What changed:  how much to take additional instructions   calcitRIOL 0.25 MCG capsule Commonly known as:  ROCALTROL Take 0.25 mcg by mouth daily.   carvedilol 6.25 MG tablet Commonly known as: COREG Take 0.5 tablets (3.125 mg total) by mouth 2 (two) times daily. Reduced from 6.25 mg twice daily. What changed:  how much to take additional instructions   cyanocobalamin 1000 MCG tablet Take 1 tablet (1,000 mcg total) by mouth daily.   fluticasone 50 MCG/ACT nasal spray Commonly known as: FLONASE Place 2 sprays into both nostrils daily.   gabapentin 100 MG capsule Commonly known as: NEURONTIN Take 1 capsule (100 mg total) by mouth at bedtime.   iron polysaccharides 150 MG capsule Commonly known as: NIFEREX Take 1 capsule (150 mg total) by mouth daily.   levothyroxine 175 MCG tablet Commonly known as: SYNTHROID Take 1 tablet (175 mcg total) by mouth daily.   midodrine 10 MG tablet Commonly known as: PROAMATINE Take 1 tablet (10 mg total) by mouth 3 (three) times  daily with meals. This is an increase from 5 mg. What changed:  medication strength See the new instructions.   OXYGEN Inhale 3 L into the lungs 3 (three) times daily as needed (shortness of breath or coughing).   pantoprazole 40 MG tablet Commonly known as: Protonix Take 1 tablet (40 mg total) by mouth daily.   spironolactone 25 MG tablet Commonly known as: ALDACTONE Take 12.5 mg by mouth daily.   sucralfate 1 g tablet Commonly known as: Carafate Take 1 tablet (1 g total) by mouth 4 (four) times daily.   torsemide 20 MG tablet Commonly known as: DEMADEX Take 2 tablets (40 mg total) by mouth daily. Increased from 20 mg daily. What changed:  how much to take additional instructions   Trelegy Ellipta 100-62.5-25 MCG/ACT Aepb Generic drug: Fluticasone-Umeclidin-Vilant Inhale 1 puff into the lungs daily.         Follow-up Information     Jon Billings, NP Follow up.   Specialty: Nurse Practitioner Contact information: [place] [address] [phone]         Corey Skains, MD Follow up in 1 week(s).   Specialty: Cardiology Contact information: [facility] [place] [address] [phone]                 Allergies  Allergen Reactions   Levaquin [Levofloxacin] Anaphylaxis   Amoxicillin Hives    Did it involve swelling of the face/tongue/throat, SOB, or low BP? No Did it involve sudden or severe rash/hives, skin peeling, or any reaction on the inside of your mouth or nose? No Did you need to seek medical attention at a hospital or doctor's office? No When did it last happen?      10+ years If all above answers are "NO", may proceed with cephalosporin use.   Nitrofuran Derivatives Other (See Comments)    Unknown   Penicillins Other (See Comments)    Thinks it made her itch a lot, but isn't sure. Did it involve swelling of the face/tongue/throat, SOB, or low BP? No Did it involve sudden or severe rash/hives, skin peeling, or any reaction on the inside of your mouth or nose? No Did you need to seek medical attention at a hospital or doctor's office? No When did it last happen?      10+ years If all above answers are "NO", may proceed with cephalosporin use.    Zithromax [Azithromycin] Other (See Comments)   Antihistamines, Chlorpheniramine-Type Rash     The results of significant diagnostics from this hospitalization (including imaging, microbiology, ancillary and laboratory) are listed below for reference.   Consultations:   Procedures/Studies: DG Chest Portable 1 View  Result Date: 02/15/2021 CLINICAL DATA:  Short of breath and leg swelling and weakness EXAM: PORTABLE CHEST 1 VIEW COMPARISON:  01/10/2021 FINDINGS: Cardiac enlargement. AICD unchanged in position. Mild vascular congestion. Negative for edema or effusion. Mild bibasilar atelectasis. IMPRESSION: Mild pulmonary vascular congestion without edema. Mild bibasilar atelectasis. Marked cardiomegaly. Electronically Signed   By: Franchot Gallo M.D.   On: 02/15/2021 10:42   US BREAST LTD UNI LEFT INC AXILLA  Result Date: 02/02/2021 CLINICAL DATA:  Left breast asymmetry and calcifications seen on patient's new baseline mammogram. EXAM: DIGITAL DIAGNOSTIC UNILATERAL LEFT MAMMOGRAM WITH TOMOSYNTHESIS AND CAD; ULTRASOUND LEFT BREAST LIMITED TECHNIQUE: Left digital diagnostic mammography and breast tomosynthesis was performed. The images were evaluated with computer-aided detection.; Targeted ultrasound examination of the left breast was performed. COMPARISON:  Previous exam(s). ACR Breast Density Category c: The breast tissue is heterogeneously dense, which may obscure small masses. FINDINGS: Additional mammographic views of the left breast demonstrate loosely grouped punctate calcifications in the slightly lower outer left breast, middle and anterior depth. The area of calcifications spans 6.6 cm in anterior to posterior dimension. No associated masses are seen. Targeted left breast ultrasound is performed demonstrating 2 probably benign cysts at 2:30 o'clock 3 cm from the nipple measuring 0.6 x 0.4 x 0.5 cm and 0.5 x 0.5 x 0.5 cm. IMPRESSION: Loosely grouped calcifications in the left breast are mildly suspicious. Recommend 1 site stereotactic core needle biopsy of the anterior or posterior extent of involvement. Two probably benign left breast 2:30 o'clock masses. With benign pathology results, recommend six-month follow-up for the remaining of the calcifications and the probably benign left breast masses. RECOMMENDATION: One site stereotactic core needle biopsy of the left breast. I have discussed the findings and recommendations with the patient. If applicable, a reminder letter will be sent to the patient regarding the next appointment. BI-RADS CATEGORY  4: Suspicious. Electronically Signed   By: Fidela Salisbury M.D.   On: 02/02/2021 11:12  MM DIAG BREAST TOMO UNI LEFT  Result Date: 02/02/2021 CLINICAL DATA:  Left breast asymmetry and  calcifications seen on patient's new baseline mammogram. EXAM: DIGITAL DIAGNOSTIC UNILATERAL LEFT MAMMOGRAM WITH TOMOSYNTHESIS AND CAD; ULTRASOUND LEFT BREAST LIMITED TECHNIQUE: Left digital diagnostic mammography and breast tomosynthesis was performed. The images were evaluated with computer-aided detection.; Targeted ultrasound examination of the left breast was performed. COMPARISON:  Previous exam(s). ACR Breast Density Category c: The breast tissue is heterogeneously dense, which may obscure small masses. FINDINGS: Additional mammographic views of the left breast demonstrate loosely grouped punctate calcifications in the slightly lower outer left breast, middle and anterior depth. The area of calcifications spans 6.6 cm in anterior to posterior dimension. No associated masses are seen. Targeted left breast ultrasound is performed demonstrating 2 probably benign cysts at 2:30 o'clock 3 cm from the nipple measuring 0.6 x 0.4 x 0.5 cm and 0.5 x 0.5 x 0.5 cm. IMPRESSION: Loosely grouped calcifications in the left breast are mildly suspicious. Recommend 1 site stereotactic core needle biopsy of the anterior or posterior extent of involvement. Two probably benign left breast 2:30 o'clock masses. With benign pathology results, recommend six-month follow-up for the remaining of the calcifications and the probably benign left breast masses. RECOMMENDATION: One site stereotactic core needle biopsy of the left breast. I have discussed the findings and recommendations with the patient. If applicable, a reminder letter will be sent to the patient regarding the next appointment. BI-RADS CATEGORY  4: Suspicious. Electronically Signed   By: Fidela Salisbury M.D.   On: 02/02/2021 11:12  ECHOCARDIOGRAM COMPLETE  Result Date: 02/16/2021    ECHOCARDIOGRAM REPORT   Patient Name:   Maria Neal Date of Exam: 02/15/2021 Medical Rec #:  [id number]    Height:       65.7 in Accession #:    [id number]   Weight:       161.0 lb Date  of Birth:  [birth year]    BSA:          1.819 m Patient Age:    21 years     BP:           112/54 mmHg Patient Gender: F            HR:           73  bpm. Exam Location:  [facility] Procedure: 2D Echo, Cardiac Doppler and Color Doppler Indications:     CHF-acute systolic D40.81  History:         Patient has prior history of Echocardiogram examinations, most                  recent 08/10/2020. CAD, AICD, COPD; Risk Factors:Hypertension.  Sonographer:     Sherrie Sport Referring Phys:  [place] Diagnosing Phys: Serafina Royals MD  Sonographer Comments: Suboptimal apical window. IMPRESSIONS  1. Left ventricular ejection fraction, by estimation, is <20%. The left ventricle has severely decreased function. The left ventricle demonstrates global hypokinesis. The left ventricular internal cavity size was severely dilated. There is mild left ventricular hypertrophy. Left ventricular diastolic parameters are consistent with Grade II diastolic dysfunction (pseudonormalization).  2. Right ventricular systolic function is mildly reduced. The right ventricular size is moderately enlarged.  3. Left atrial size was mildly dilated.  4. Right atrial size was mildly dilated.  5. The mitral valve is normal in structure. Severe mitral valve regurgitation.  6. Tricuspid valve regurgitation is moderate.  7. The aortic valve is normal in structure. Aortic valve regurgitation is trivial. FINDINGS  Left Ventricle: Left ventricular ejection fraction, by estimation, is <20%. The left ventricle has severely decreased function. The left ventricle demonstrates global hypokinesis. The left ventricular internal cavity size was severely dilated. There is mild left ventricular hypertrophy. Left ventricular diastolic parameters are consistent with Grade II diastolic dysfunction (pseudonormalization). Right Ventricle: The right ventricular size is moderately enlarged. No increase in right ventricular wall thickness. Right ventricular systolic function  is mildly reduced. Left Atrium: Left atrial size was mildly dilated. Right Atrium: Right atrial size was mildly dilated. Pericardium: There is no evidence of pericardial effusion. Mitral Valve: The mitral valve is normal in structure. Severe mitral valve regurgitation. MV peak gradient, 6.0 mmHg. The mean mitral valve gradient is 2.0 mmHg. Tricuspid Valve: The tricuspid valve is normal in structure. Tricuspid valve regurgitation is moderate. Aortic Valve: The aortic valve is normal in structure. Aortic valve regurgitation is trivial. Aortic regurgitation PHT measures 394 msec. Aortic valve mean gradient measures 3.7 mmHg. Aortic valve peak gradient measures 6.7 mmHg. Aortic valve area, by VTI measures 1.50 cm. Pulmonic Valve: The pulmonic valve was normal in structure. Pulmonic valve regurgitation is trivial. Aorta: The aortic root and ascending aorta are structurally normal, with no evidence of dilitation. IAS/Shunts: No atrial level shunt detected by color flow Doppler.  LEFT VENTRICLE PLAX 2D LVIDd:         7.10 cm      Diastology LVIDs:         6.70 cm      LV e' medial:    6.20 cm/s LV PW:         1.10 cm      LV E/e' medial:  14.6 LV IVS:        0.80 cm      LV e' lateral:   7.83 cm/s LVOT diam:     2.00 cm      LV E/e' lateral: 11.6 LV SV:         30 LV SV Index:   17 LVOT Area:     3.14 cm  LV Volumes (MOD) LV vol d, MOD A2C: 221.0 ml LV vol d, MOD A4C: 258.0 ml LV vol s, MOD A2C: 99.7 ml LV vol s, MOD A4C: 179.0 ml LV SV MOD A2C:  121.3 ml LV SV MOD A4C:     258.0 ml LV SV MOD BP:      105.8 ml RIGHT VENTRICLE RV Basal diam:  6.60 cm RV S prime:     9.46 cm/s TAPSE (M-mode): 2.3 cm LEFT ATRIUM              Index        RIGHT ATRIUM           Index LA diam:        6.10 cm  3.35 cm/m   RA Area:     29.60 cm LA Vol (A2C):   170.0 ml 93.46 ml/m  RA Volume:   109.00 ml 59.92 ml/m LA Vol (A4C):   163.0 ml 89.61 ml/m LA Biplane Vol: 173.0 ml 95.11 ml/m  AORTIC VALVE AV Area (Vmax):    1.16 cm AV Area  (Vmean):   1.21 cm AV Area (VTI):     1.50 cm AV Vmax:           129.33 cm/s AV Vmean:          86.467 cm/s AV VTI:            0.202 m AV Peak Grad:      6.7 mmHg AV Mean Grad:      3.7 mmHg LVOT Vmax:         47.60 cm/s LVOT Vmean:        33.400 cm/s LVOT VTI:          0.097 m LVOT/AV VTI ratio: 0.48 AI PHT:            394 msec  AORTA Ao Root diam: 2.90 cm MITRAL VALVE               TRICUSPID VALVE MV Area (PHT): 3.77 cm    TR Peak grad:   40.4 mmHg MV Area VTI:   1.36 cm    TR Vmax:        318.00 cm/s MV Peak grad:  6.0 mmHg MV Mean grad:  2.0 mmHg    SHUNTS MV Vmax:       1.22 m/s    Systemic VTI:  0.10 m MV Vmean:      67.0 cm/s   Systemic Diam: 2.00 cm MV Decel Time: 201 msec MV E velocity: 90.80 cm/s MV A velocity: 44.60 cm/s MV E/A ratio:  2.04 Serafina Royals MD Electronically signed by Serafina Royals MD Signature Date/Time: 02/16/2021/8:07:09 AM    Final       Labs: BNP (last 3 results) Recent Labs    11/04/20 0956 11/07/20 0445 02/15/21 1056  BNP 2,657.1* 2,479.7* 0,258.5*   Basic Metabolic Panel: Recent Labs  Lab 02/15/21 1055 02/16/21 0557 02/17/21 0444 02/18/21 0534 02/19/21 0531  NA 136 137 135 136 139  K 4.1 3.7 4.2 4.0 4.1  CL 96* 94* 94* 94* 93*  CO2 29 33* 31 33* 34*  GLUCOSE 94 83 95 88 94  BUN 66* 67* 70* 67* 71*  CREATININE 2.19* 2.18* 2.30* 2.21* 2.25*  CALCIUM 9.0 8.9 9.0 9.2 9.7  MG  --   --  2.1 2.0 2.0   Liver Function Tests: Recent Labs  Lab 02/15/21 1056  AST 25  ALT 21  ALKPHOS 91  BILITOT 1.9*  PROT 6.6  ALBUMIN 3.0*   No results for input(s): LIPASE, AMYLASE in the last 168 hours. No results for input(s): AMMONIA in the last 168 hours. CBC: Recent Labs  Lab  02/15/21 1055 02/16/21 0557 02/17/21 0444 02/18/21 0534 02/19/21 0531  WBC 4.2 4.3 4.4 4.8 4.3  HGB 9.8* 8.8* 9.0* 9.3* 9.5*  HCT 34.9* 31.1* 31.2* 32.7* 33.3*  MCV 91.6 89.6 88.4 89.8 87.9  PLT 154 138* 151 152 146*   Cardiac Enzymes: No results for input(s): CKTOTAL,  CKMB, CKMBINDEX, TROPONINI in the last 168 hours. BNP: Invalid input(s): POCBNP CBG: No results for input(s): GLUCAP in the last 168 hours. D-Dimer No results for input(s): DDIMER in the last 72 hours. Hgb A1c No results for input(s): HGBA1C in the last 72 hours. Lipid Profile No results for input(s): CHOL, HDL, LDLCALC, TRIG, CHOLHDL, LDLDIRECT in the last 72 hours. Thyroid function studies No results for input(s): TSH, T4TOTAL, T3FREE, THYROIDAB in the last 72 hours.  Invalid input(s): FREET3 Anemia work up No results for input(s): VITAMINB12, FOLATE, FERRITIN, TIBC, IRON, RETICCTPCT in the last 72 hours. Urinalysis    Component Value Date/Time   COLORURINE YELLOW (A) 10/06/2018 1737   APPEARANCEUR Clear 11/11/2020 1024   LABSPEC 1.012 10/06/2018 1737   LABSPEC 1.021 09/21/2012 1516   PHURINE 6.0 10/06/2018 1737   GLUCOSEU Negative 11/11/2020 1024   GLUCOSEU Negative 09/21/2012 1516   HGBUR NEGATIVE 10/06/2018 1737   BILIRUBINUR Negative 11/11/2020 1024   BILIRUBINUR Negative 09/21/2012 1516   KETONESUR NEGATIVE 10/06/2018 1737   PROTEINUR Negative 11/11/2020 1024   PROTEINUR NEGATIVE 10/06/2018 1737   NITRITE Negative 11/11/2020 1024   NITRITE NEGATIVE 10/06/2018 1737   LEUKOCYTESUR 1+ (A) 11/11/2020 1024   LEUKOCYTESUR TRACE (A) 10/06/2018 1737   LEUKOCYTESUR Negative 09/21/2012 1516   Sepsis Labs Invalid input(s): PROCALCITONIN,  WBC,  LACTICIDVEN Microbiology Recent Results (from the past 240 hour(s))  Resp Panel by RT-PCR (Flu A&B, Covid) Nasopharyngeal Swab     Status: None   Collection Time: 02/15/21 10:56 AM   Specimen: Nasopharyngeal Swab; Nasopharyngeal(NP) swabs in vial transport medium  Result Value Ref Range Status   SARS Coronavirus 2 by RT PCR NEGATIVE NEGATIVE Final    Comment: (NOTE) SARS-CoV-2 target nucleic acids are NOT DETECTED.  The SARS-CoV-2 RNA is generally detectable in upper respiratory specimens during the acute phase of infection. The  lowest concentration of SARS-CoV-2 viral copies this assay can detect is 138 copies/mL. A negative result does not preclude SARS-Cov-2 infection and should not be used as the sole basis for treatment or other patient management decisions. A negative result may occur with  improper specimen collection/handling, submission of specimen other than nasopharyngeal swab, presence of viral mutation(s) within the areas targeted by this assay, and inadequate number of viral copies(<138 copies/mL). A negative result must be combined with clinical observations, patient history, and epidemiological information. The expected result is Negative.  Fact Sheet for Patients:  EntrepreneurPulse.com.au  Fact Sheet for Healthcare Providers:  IncredibleEmployment.be  This test is no t yet approved or cleared by the [place] FDA and  has been authorized for detection and/or diagnosis of SARS-CoV-2 by FDA under an Emergency Use Authorization (EUA). This EUA will remain  in effect (meaning this test can be used) for the duration of the COVID-19 declaration under Section 564(b)(1) of the Act, 21 U.S.C.section 360bbb-3(b)(1), unless the authorization is terminated  or revoked sooner.       Influenza A by PCR NEGATIVE NEGATIVE Final   Influenza B by PCR NEGATIVE NEGATIVE Final    Comment: (NOTE) The Xpert Xpress SARS-CoV-2/FLU/RSV plus assay is intended as an aid in the diagnosis of influenza from Nasopharyngeal swab specimens  and should not be used as a sole basis for treatment. Nasal washings and aspirates are unacceptable for Xpert Xpress SARS-CoV-2/FLU/RSV testing.  Fact Sheet for Patients: EntrepreneurPulse.com.au  Fact Sheet for Healthcare Providers: IncredibleEmployment.be  This test is not yet approved or cleared by the [place] FDA and has been authorized for detection and/or diagnosis of SARS-CoV-2 by FDA under  an Emergency Use Authorization (EUA). This EUA will remain in effect (meaning this test can be used) for the duration of the COVID-19 declaration under Section 564(b)(1) of the Act, 21 U.S.C. section 360bbb-3(b)(1), unless the authorization is terminated or revoked.  Performed at [facility], [address] [place], [place] [address]   MRSA Next Gen by PCR, Nasal     Status: None   Collection Time: 02/16/21  7:00 AM   Specimen: Nasal Mucosa; Nasal Swab  Result Value Ref Range Status   MRSA by PCR Next Gen NOT DETECTED NOT DETECTED Final    Comment: (NOTE) The GeneXpert MRSA Assay (FDA approved for NASAL specimens only), is one component of a comprehensive MRSA colonization surveillance program. It is not intended to diagnose MRSA infection nor to guide or monitor treatment for MRSA infections. Test performance is not FDA approved in patients less than 69 years old. Performed at [facility], [address] [place], [place] [address]      Total time spend on discharging this patient, including the last patient exam, discussing the hospital stay, instructions for ongoing care as it relates to all pertinent caregivers, as well as preparing the medical discharge records, prescriptions, and/or referrals as applicable, is 45 minutes.    Enzo Bi, MD  [facility] 02/19/2021, 11:54 AM

## 2021-02-19 NOTE — Progress Notes (Signed)
Mobility Specialist - Progress Note    02/19/21 0900  Mobility  Activity Transferred to/from BSC;Stood at bedside;Dangled on edge of bed  Level of Assistance Standby assist, set-up cues, supervision of patient - no hands on  Assistive Device Standard walker  Distance Ambulated (ft) 10 ft  Activity Response Tolerated well  $Mobility charge 1 Mobility   Pt supine utilizing 4L upon arrival. Pt sat EOB and ambulated with supervision for B-BSC transfer ---- small urinary output. Pt transferred [facility]- C with supervision + vc, voiced no complaints. Pt left with NT, alarm set and needs in reach.  Merrily Brittle Mobility Specialist 02/19/21, 9:59 AM

## 2021-02-21 ENCOUNTER — Other Ambulatory Visit: Payer: Self-pay | Admitting: Nurse Practitioner

## 2021-02-21 ENCOUNTER — Telehealth: Payer: Self-pay | Admitting: Nurse Practitioner

## 2021-02-21 ENCOUNTER — Telehealth: Payer: Self-pay | Admitting: *Deleted

## 2021-02-21 NOTE — Telephone Encounter (Signed)
Medication Refill - Medication: sucralfate (CARAFATE) 1 g tablet   Has the patient contacted their pharmacy? No.P's daughter stated she is unsure of who the provider is who prescribed medication and wants to ask pt PCP if she will refill.   (Agent: If no, request that the patient contact the pharmacy for the refill. If patient does not wish to contact the pharmacy document the reason why and proceed with request.)   Preferred Pharmacy (with phone number or street name):  [facility] [place], [place] [address]  Phone: [phone] Fax: [phone]  Hours: Not open 24 hours   Has the patient been seen for an appointment in the last year OR does the patient have an upcoming appointment? Yes.    Agent: Please be advised that RX refills may take up to 3 business days. We ask that you follow-up with your pharmacy.

## 2021-02-21 NOTE — Telephone Encounter (Signed)
Representative from [facility] aware of provider advise.

## 2021-02-21 NOTE — Telephone Encounter (Signed)
Copied from [place] [id number]. Topic: General - Other >> Feb 21, 2021  1:57 PM Yvette Rack wrote: Reason for CRM: Tiffany with [facility] stated they have not been able to reach the patient and she wanted to get approval from provider to try to reach patient again tomorrow. Cb# [phone] Option 2

## 2021-02-21 NOTE — Telephone Encounter (Signed)
Transition Care Management Follow-up Telephone Call Date of discharge and from where: [facility] 02-19-2021 How have you been since you were released from the hospital? Spoke with daughter mom I tired Any questions or concerns? No     Patient daughter [name] did state that when they got home mom tripped and fell on side.  Had no concerns and stated patient had no injury.      Items Reviewed: Did the pt receive and understand the discharge instructions provided? Yes  Medications obtained and verified? Yes  Other? No  Any new allergies since your discharge? No  Dietary orders reviewed? No Do you have support at home? Yes   Home Care and Equipment/Supplies: Were home health services ordered? no If so, what is the name of the agency? Patient is oxygen  Has the agency set up a time to come to the patient's home? not applicable Were any new equipment or medical supplies ordered?  No What is the name of the medical supply agency?  Were you able to get the supplies/equipment? not applicable Do you have any questions related to the use of the equipment or supplies? No  Functional Questionnaire: (I = Independent and D = Dependent) ADLs: D  Bathing/Dressing- D  Meal Prep- D  Eating- I  Maintaining continence- I  Transferring/Ambulation- I  Managing Meds- D  Follow up appointments reviewed:  PCP Hospital f/u appt confirmed? Yes  Scheduled to see .[place]  on 02-24-2021. [facility] f/u appt confirmed? Yes  Scheduled to see [facility] on 03-01-2021 @ 9:00. Are transportation arrangements needed? No  If their condition worsens, is the pt aware to call PCP or go to the Emergency Dept.? Yes Was the patient provided with contact information for the PCP's office or ED? Yes Was to pt encouraged to call back with questions or concerns? Yes

## 2021-02-22 ENCOUNTER — Telehealth: Payer: Self-pay | Admitting: Nurse Practitioner

## 2021-02-22 ENCOUNTER — Ambulatory Visit: Payer: Self-pay | Admitting: *Deleted

## 2021-02-22 DIAGNOSIS — Z7951 Long term (current) use of inhaled steroids: Secondary | ICD-10-CM | POA: Diagnosis not present

## 2021-02-22 DIAGNOSIS — J9611 Chronic respiratory failure with hypoxia: Secondary | ICD-10-CM | POA: Diagnosis not present

## 2021-02-22 DIAGNOSIS — I051 Rheumatic mitral insufficiency: Secondary | ICD-10-CM | POA: Diagnosis not present

## 2021-02-22 DIAGNOSIS — K219 Gastro-esophageal reflux disease without esophagitis: Secondary | ICD-10-CM | POA: Diagnosis not present

## 2021-02-22 DIAGNOSIS — G629 Polyneuropathy, unspecified: Secondary | ICD-10-CM | POA: Diagnosis not present

## 2021-02-22 DIAGNOSIS — R296 Repeated falls: Secondary | ICD-10-CM | POA: Diagnosis not present

## 2021-02-22 DIAGNOSIS — I5043 Acute on chronic combined systolic (congestive) and diastolic (congestive) heart failure: Secondary | ICD-10-CM | POA: Diagnosis not present

## 2021-02-22 DIAGNOSIS — Z9981 Dependence on supplemental oxygen: Secondary | ICD-10-CM | POA: Diagnosis not present

## 2021-02-22 DIAGNOSIS — I130 Hypertensive heart and chronic kidney disease with heart failure and stage 1 through stage 4 chronic kidney disease, or unspecified chronic kidney disease: Secondary | ICD-10-CM | POA: Diagnosis not present

## 2021-02-22 DIAGNOSIS — J449 Chronic obstructive pulmonary disease, unspecified: Secondary | ICD-10-CM | POA: Diagnosis not present

## 2021-02-22 DIAGNOSIS — G2581 Restless legs syndrome: Secondary | ICD-10-CM | POA: Diagnosis not present

## 2021-02-22 DIAGNOSIS — D631 Anemia in chronic kidney disease: Secondary | ICD-10-CM | POA: Diagnosis not present

## 2021-02-22 DIAGNOSIS — I2510 Atherosclerotic heart disease of native coronary artery without angina pectoris: Secondary | ICD-10-CM | POA: Diagnosis not present

## 2021-02-22 DIAGNOSIS — I429 Cardiomyopathy, unspecified: Secondary | ICD-10-CM | POA: Diagnosis not present

## 2021-02-22 DIAGNOSIS — Z9181 History of falling: Secondary | ICD-10-CM | POA: Diagnosis not present

## 2021-02-22 DIAGNOSIS — E039 Hypothyroidism, unspecified: Secondary | ICD-10-CM | POA: Diagnosis not present

## 2021-02-22 DIAGNOSIS — I482 Chronic atrial fibrillation, unspecified: Secondary | ICD-10-CM | POA: Diagnosis not present

## 2021-02-22 DIAGNOSIS — I9589 Other hypotension: Secondary | ICD-10-CM | POA: Diagnosis not present

## 2021-02-22 DIAGNOSIS — N184 Chronic kidney disease, stage 4 (severe): Secondary | ICD-10-CM | POA: Diagnosis not present

## 2021-02-22 DIAGNOSIS — Z87891 Personal history of nicotine dependence: Secondary | ICD-10-CM | POA: Diagnosis not present

## 2021-02-22 NOTE — Telephone Encounter (Signed)
Requested medications are due for refill today.  unsure  Requested medications are on the active medications list.  yes  Last refill. 01/10/2021 #40 0 refills  Future visit scheduled.   yes  Notes to clinic.  Unsure if pt still to be taking this medication. This rx was only  enough medication to last for 10 days.    Requested Prescriptions  Pending Prescriptions Disp Refills   sucralfate (CARAFATE) 1 g tablet 40 tablet 0    Sig: Take 1 tablet (1 g total) by mouth 4 (four) times daily.     Gastroenterology: Antiacids Passed - 02/21/2021 11:15 AM      Passed - Valid encounter within last 12 months    Recent Outpatient Visits           3 weeks ago Chronic kidney disease, stage 3a ([place])   Greasy, Karen, NP   1 month ago Acute GI bleeding   [place], NP   1 month ago Hypoxemia   [facility] Vigg, Avanti, MD   2 months ago Chronic systolic congestive heart failure, NYHA class 3 ([place])   [facility] Jon Billings, NP   3 months ago Hospital discharge follow-up   [place], NP       Future Appointments             In 2 days Jon Billings, NP [facility] [place]   In 2 weeks Lucilla Lame, MD [facility] [place]

## 2021-02-22 NOTE — Telephone Encounter (Signed)
Routing to provider for verbal orders 

## 2021-02-22 NOTE — Telephone Encounter (Signed)
Copied from [place] [phone]. Topic: Quick Communication - Home Health Verbal Orders >> Feb 22, 2021  1:59 PM [name], Apolonio Schneiders wrote: Caller/Agency: [facility] Callback Number: [phone] Requesting: Skilled nursing  Frequency: 1x9 with 2 PRN visits

## 2021-02-22 NOTE — Telephone Encounter (Signed)
°  Chief Complaint: IV site red, warm to touch Symptoms: mild redness, warmth, mild swelling at site. IV in hosputal, d/ced Saturday. Frequency: Saturday Pertinent Negatives: Patient denies fever, redness does not extend, no lump palpated..  Disposition: [] ED /[] Urgent Care (no appt availability in office) / [] Appointment(In office/virtual)/ []  [place] [facility]/ [x] Home Care/ [] Refused Recommended Disposition /[] [place] [facility]/ []  Follow-up with PCP Additional Notes: Advised to call back if symptoms worsen.    Reason for Disposition  Small area of skin swelling (no redness or pain) at prior IV site  Answer Assessment - Initial Assessment Questions 1. SYMPTOM:  "What's the main symptom you're concerned about?" (e.g., pain, redness, swelling, pus)     *No Answer* 2. ONSET: "When did the *No Answer* start?"     yesterday 3. IV TYPE: "What kind of IV line do you have?" (e.g., central line, PICC, peripheral IV)     peripheral 4. IV LOCATION - SITE: "Where does the IV enter your body?"     Left wrist area 5. IV START DATE: "When was this IV put in?"      6. IV REASON: "Why do you have this IV line?"     Lasix 7. IV FUNCTION: "Describe how the IV is running?" (e.g., running normally, running slowly, not running, unable to flush)       8. PAIN: "Is there any pain?" If Yes, ask: "How bad is the pain?" (e.g., scale 1-10; or mild, moderate, severe) "Describe the pain." (e.g., burning, throbbing, shooting, sharp, etc.)   - NONE (0): no pain   - MILD (1-3): doesn't interfere with normal activities    - MODERATE (4-7): interferes with normal activities or awakens from sleep    - SEVERE (8-10): excruciating pain, unable to do any normal activities      Very 9. SWELLING: "Is there any swelling at your IV site?"     mild 10. FEVER: "Do you have a fever?" If Yes, ask: "What is your temperature, how was it measured, and when did it start?"         11. OTHER SYMPTOMS: "Do you have any  other symptoms?" (e.g., shaking chills, weakness)     Site tender to touch  Protocols used: IV Site and Other Symptoms-A-AH

## 2021-02-22 NOTE — Telephone Encounter (Signed)
Home Health Verbal Orders - Caller/Agency: [facility] Number: [phone] Requesting OT/PT/Skilled Nursing/Social Work/Speech Therapy: PT  Frequency:   2w5 1w4

## 2021-02-23 ENCOUNTER — Other Ambulatory Visit: Payer: Self-pay

## 2021-02-23 ENCOUNTER — Ambulatory Visit: Payer: Self-pay | Admitting: *Deleted

## 2021-02-23 ENCOUNTER — Emergency Department: Payer: Medicare Other

## 2021-02-23 ENCOUNTER — Inpatient Hospital Stay
Admission: EM | Admit: 2021-02-23 | Discharge: 2021-03-02 | DRG: 871 | Disposition: E | Payer: Medicare Other | Attending: Pulmonary Disease | Admitting: Pulmonary Disease

## 2021-02-23 DIAGNOSIS — R0902 Hypoxemia: Secondary | ICD-10-CM

## 2021-02-23 DIAGNOSIS — D696 Thrombocytopenia, unspecified: Secondary | ICD-10-CM | POA: Diagnosis not present

## 2021-02-23 DIAGNOSIS — G929 Unspecified toxic encephalopathy: Secondary | ICD-10-CM | POA: Diagnosis not present

## 2021-02-23 DIAGNOSIS — I499 Cardiac arrhythmia, unspecified: Secondary | ICD-10-CM | POA: Diagnosis not present

## 2021-02-23 DIAGNOSIS — R7989 Other specified abnormal findings of blood chemistry: Secondary | ICD-10-CM | POA: Diagnosis present

## 2021-02-23 DIAGNOSIS — G2581 Restless legs syndrome: Secondary | ICD-10-CM | POA: Diagnosis not present

## 2021-02-23 DIAGNOSIS — J449 Chronic obstructive pulmonary disease, unspecified: Secondary | ICD-10-CM | POA: Diagnosis not present

## 2021-02-23 DIAGNOSIS — R6521 Severe sepsis with septic shock: Secondary | ICD-10-CM | POA: Diagnosis present

## 2021-02-23 DIAGNOSIS — Z818 Family history of other mental and behavioral disorders: Secondary | ICD-10-CM

## 2021-02-23 DIAGNOSIS — E43 Unspecified severe protein-calorie malnutrition: Secondary | ICD-10-CM | POA: Diagnosis not present

## 2021-02-23 DIAGNOSIS — I4891 Unspecified atrial fibrillation: Secondary | ICD-10-CM | POA: Diagnosis not present

## 2021-02-23 DIAGNOSIS — R230 Cyanosis: Secondary | ICD-10-CM | POA: Diagnosis not present

## 2021-02-23 DIAGNOSIS — I5023 Acute on chronic systolic (congestive) heart failure: Secondary | ICD-10-CM | POA: Diagnosis not present

## 2021-02-23 DIAGNOSIS — I5022 Chronic systolic (congestive) heart failure: Secondary | ICD-10-CM | POA: Diagnosis not present

## 2021-02-23 DIAGNOSIS — J9621 Acute and chronic respiratory failure with hypoxia: Secondary | ICD-10-CM

## 2021-02-23 DIAGNOSIS — R7881 Bacteremia: Secondary | ICD-10-CM | POA: Diagnosis not present

## 2021-02-23 DIAGNOSIS — N184 Chronic kidney disease, stage 4 (severe): Secondary | ICD-10-CM | POA: Diagnosis present

## 2021-02-23 DIAGNOSIS — I509 Heart failure, unspecified: Secondary | ICD-10-CM | POA: Diagnosis not present

## 2021-02-23 DIAGNOSIS — Z9981 Dependence on supplemental oxygen: Secondary | ICD-10-CM

## 2021-02-23 DIAGNOSIS — I5043 Acute on chronic combined systolic (congestive) and diastolic (congestive) heart failure: Secondary | ICD-10-CM | POA: Diagnosis present

## 2021-02-23 DIAGNOSIS — I482 Chronic atrial fibrillation, unspecified: Secondary | ICD-10-CM | POA: Diagnosis not present

## 2021-02-23 DIAGNOSIS — Z8619 Personal history of other infectious and parasitic diseases: Secondary | ICD-10-CM

## 2021-02-23 DIAGNOSIS — I428 Other cardiomyopathies: Secondary | ICD-10-CM | POA: Diagnosis present

## 2021-02-23 DIAGNOSIS — N1832 Chronic kidney disease, stage 3b: Secondary | ICD-10-CM | POA: Diagnosis not present

## 2021-02-23 DIAGNOSIS — J9601 Acute respiratory failure with hypoxia: Secondary | ICD-10-CM | POA: Diagnosis not present

## 2021-02-23 DIAGNOSIS — I420 Dilated cardiomyopathy: Secondary | ICD-10-CM | POA: Diagnosis present

## 2021-02-23 DIAGNOSIS — R570 Cardiogenic shock: Secondary | ICD-10-CM | POA: Diagnosis not present

## 2021-02-23 DIAGNOSIS — Z515 Encounter for palliative care: Secondary | ICD-10-CM

## 2021-02-23 DIAGNOSIS — I2510 Atherosclerotic heart disease of native coronary artery without angina pectoris: Secondary | ICD-10-CM | POA: Diagnosis present

## 2021-02-23 DIAGNOSIS — L03114 Cellulitis of left upper limb: Secondary | ICD-10-CM | POA: Diagnosis present

## 2021-02-23 DIAGNOSIS — Z8349 Family history of other endocrine, nutritional and metabolic diseases: Secondary | ICD-10-CM

## 2021-02-23 DIAGNOSIS — R748 Abnormal levels of other serum enzymes: Secondary | ICD-10-CM | POA: Diagnosis not present

## 2021-02-23 DIAGNOSIS — I517 Cardiomegaly: Secondary | ICD-10-CM | POA: Diagnosis not present

## 2021-02-23 DIAGNOSIS — E876 Hypokalemia: Secondary | ICD-10-CM

## 2021-02-23 DIAGNOSIS — A4101 Sepsis due to Methicillin susceptible Staphylococcus aureus: Principal | ICD-10-CM | POA: Diagnosis present

## 2021-02-23 DIAGNOSIS — Z743 Need for continuous supervision: Secondary | ICD-10-CM | POA: Diagnosis not present

## 2021-02-23 DIAGNOSIS — K219 Gastro-esophageal reflux disease without esophagitis: Secondary | ICD-10-CM | POA: Diagnosis present

## 2021-02-23 DIAGNOSIS — Z880 Allergy status to penicillin: Secondary | ICD-10-CM

## 2021-02-23 DIAGNOSIS — I480 Paroxysmal atrial fibrillation: Secondary | ICD-10-CM | POA: Diagnosis not present

## 2021-02-23 DIAGNOSIS — I5021 Acute systolic (congestive) heart failure: Secondary | ICD-10-CM | POA: Diagnosis not present

## 2021-02-23 DIAGNOSIS — F411 Generalized anxiety disorder: Secondary | ICD-10-CM | POA: Diagnosis present

## 2021-02-23 DIAGNOSIS — R0689 Other abnormalities of breathing: Secondary | ICD-10-CM | POA: Diagnosis not present

## 2021-02-23 DIAGNOSIS — M7989 Other specified soft tissue disorders: Secondary | ICD-10-CM | POA: Diagnosis not present

## 2021-02-23 DIAGNOSIS — Z6829 Body mass index (BMI) 29.0-29.9, adult: Secondary | ICD-10-CM

## 2021-02-23 DIAGNOSIS — J9622 Acute and chronic respiratory failure with hypercapnia: Secondary | ICD-10-CM | POA: Diagnosis present

## 2021-02-23 DIAGNOSIS — Z66 Do not resuscitate: Secondary | ICD-10-CM | POA: Diagnosis not present

## 2021-02-23 DIAGNOSIS — I340 Nonrheumatic mitral (valve) insufficiency: Secondary | ICD-10-CM | POA: Diagnosis present

## 2021-02-23 DIAGNOSIS — Z95810 Presence of automatic (implantable) cardiac defibrillator: Secondary | ICD-10-CM

## 2021-02-23 DIAGNOSIS — R6889 Other general symptoms and signs: Secondary | ICD-10-CM | POA: Diagnosis not present

## 2021-02-23 DIAGNOSIS — Z20822 Contact with and (suspected) exposure to covid-19: Secondary | ICD-10-CM | POA: Diagnosis not present

## 2021-02-23 DIAGNOSIS — R600 Localized edema: Secondary | ICD-10-CM | POA: Diagnosis not present

## 2021-02-23 DIAGNOSIS — Z87442 Personal history of urinary calculi: Secondary | ICD-10-CM

## 2021-02-23 DIAGNOSIS — R0600 Dyspnea, unspecified: Secondary | ICD-10-CM | POA: Diagnosis not present

## 2021-02-23 DIAGNOSIS — R296 Repeated falls: Secondary | ICD-10-CM | POA: Diagnosis present

## 2021-02-23 DIAGNOSIS — Z79890 Hormone replacement therapy (postmenopausal): Secondary | ICD-10-CM

## 2021-02-23 DIAGNOSIS — Z9049 Acquired absence of other specified parts of digestive tract: Secondary | ICD-10-CM

## 2021-02-23 DIAGNOSIS — L8990 Pressure ulcer of unspecified site, unspecified stage: Secondary | ICD-10-CM | POA: Diagnosis present

## 2021-02-23 DIAGNOSIS — Z888 Allergy status to other drugs, medicaments and biological substances status: Secondary | ICD-10-CM

## 2021-02-23 DIAGNOSIS — L89151 Pressure ulcer of sacral region, stage 1: Secondary | ICD-10-CM | POA: Diagnosis present

## 2021-02-23 DIAGNOSIS — I130 Hypertensive heart and chronic kidney disease with heart failure and stage 1 through stage 4 chronic kidney disease, or unspecified chronic kidney disease: Secondary | ICD-10-CM | POA: Diagnosis not present

## 2021-02-23 DIAGNOSIS — E039 Hypothyroidism, unspecified: Secondary | ICD-10-CM | POA: Diagnosis not present

## 2021-02-23 DIAGNOSIS — I8001 Phlebitis and thrombophlebitis of superficial vessels of right lower extremity: Secondary | ICD-10-CM | POA: Diagnosis present

## 2021-02-23 DIAGNOSIS — R0602 Shortness of breath: Secondary | ICD-10-CM | POA: Diagnosis not present

## 2021-02-23 DIAGNOSIS — I110 Hypertensive heart disease with heart failure: Secondary | ICD-10-CM | POA: Diagnosis not present

## 2021-02-23 DIAGNOSIS — R609 Edema, unspecified: Secondary | ICD-10-CM | POA: Diagnosis present

## 2021-02-23 DIAGNOSIS — D631 Anemia in chronic kidney disease: Secondary | ICD-10-CM | POA: Diagnosis present

## 2021-02-23 DIAGNOSIS — I214 Non-ST elevation (NSTEMI) myocardial infarction: Secondary | ICD-10-CM | POA: Diagnosis not present

## 2021-02-23 DIAGNOSIS — Z87891 Personal history of nicotine dependence: Secondary | ICD-10-CM

## 2021-02-23 DIAGNOSIS — Z79899 Other long term (current) drug therapy: Secondary | ICD-10-CM

## 2021-02-23 DIAGNOSIS — G629 Polyneuropathy, unspecified: Secondary | ICD-10-CM | POA: Diagnosis present

## 2021-02-23 DIAGNOSIS — I9589 Other hypotension: Secondary | ICD-10-CM | POA: Diagnosis present

## 2021-02-23 DIAGNOSIS — Z8261 Family history of arthritis: Secondary | ICD-10-CM

## 2021-02-23 DIAGNOSIS — N179 Acute kidney failure, unspecified: Secondary | ICD-10-CM | POA: Diagnosis not present

## 2021-02-23 DIAGNOSIS — M79632 Pain in left forearm: Secondary | ICD-10-CM | POA: Diagnosis not present

## 2021-02-23 DIAGNOSIS — Z833 Family history of diabetes mellitus: Secondary | ICD-10-CM

## 2021-02-23 DIAGNOSIS — Z881 Allergy status to other antibiotic agents status: Secondary | ICD-10-CM

## 2021-02-23 DIAGNOSIS — Z8249 Family history of ischemic heart disease and other diseases of the circulatory system: Secondary | ICD-10-CM

## 2021-02-23 DIAGNOSIS — B9561 Methicillin susceptible Staphylococcus aureus infection as the cause of diseases classified elsewhere: Secondary | ICD-10-CM | POA: Diagnosis not present

## 2021-02-23 DIAGNOSIS — R54 Age-related physical debility: Secondary | ICD-10-CM | POA: Diagnosis present

## 2021-02-23 LAB — BASIC METABOLIC PANEL
Anion gap: 14 (ref 5–15)
BUN: 67 mg/dL — ABNORMAL HIGH (ref 8–23)
CO2: 31 mmol/L (ref 22–32)
Calcium: 8.9 mg/dL (ref 8.9–10.3)
Chloride: 91 mmol/L — ABNORMAL LOW (ref 98–111)
Creatinine, Ser: 2.42 mg/dL — ABNORMAL HIGH (ref 0.44–1.00)
GFR, Estimated: 21 mL/min — ABNORMAL LOW (ref >60)
Glucose, Bld: 100 mg/dL — ABNORMAL HIGH (ref 70–99)
Potassium: 4.1 mmol/L (ref 3.5–5.1)
Sodium: 136 mmol/L (ref 135–145)

## 2021-02-23 LAB — CBC
HCT: 33.5 % — ABNORMAL LOW (ref 36.0–46.0)
Hemoglobin: 9.5 g/dL — ABNORMAL LOW (ref 12.0–15.0)
MCH: 24.9 pg — ABNORMAL LOW (ref 26.0–34.0)
MCHC: 28.4 g/dL — ABNORMAL LOW (ref 30.0–36.0)
MCV: 87.9 fL (ref 80.0–100.0)
Platelets: 121 10*3/uL — ABNORMAL LOW (ref 150–400)
RBC: 3.81 MIL/uL — ABNORMAL LOW (ref 3.87–5.11)
RDW: 21.4 % — ABNORMAL HIGH (ref 11.5–15.5)
WBC: 8.7 10*3/uL (ref 4.0–10.5)
nRBC: 2.3 % — ABNORMAL HIGH (ref 0.0–0.2)

## 2021-02-23 LAB — RESP PANEL BY RT-PCR (FLU A&B, COVID) ARPGX2
Influenza A by PCR: NEGATIVE
Influenza B by PCR: NEGATIVE
SARS Coronavirus 2 by RT PCR: NEGATIVE

## 2021-02-23 LAB — PROTIME-INR
INR: 1.5 — ABNORMAL HIGH (ref 0.8–1.2)
Prothrombin Time: 17.8 seconds — ABNORMAL HIGH (ref 11.4–15.2)

## 2021-02-23 LAB — BRAIN NATRIURETIC PEPTIDE: B Natriuretic Peptide: >4500 pg/mL — ABNORMAL HIGH (ref 0.0–100.0)

## 2021-02-23 LAB — TROPONIN I (HIGH SENSITIVITY): Troponin I (High Sensitivity): 131 ng/L (ref <18)

## 2021-02-23 MED ORDER — FUROSEMIDE 10 MG/ML IJ SOLN
60.0000 mg | Freq: Once | INTRAMUSCULAR | Status: AC
Start: 2021-02-23 — End: 2021-02-23
  Administered 2021-02-23: 60 mg via INTRAVENOUS
  Filled 2021-02-23: qty 8

## 2021-02-23 MED ORDER — SUCRALFATE 1 G PO TABS: 1.0000 g | ORAL_TABLET | Freq: Four times a day (QID) | ORAL | 0 refills | Status: AC

## 2021-02-23 MED ORDER — NITROGLYCERIN 0.4 MG SL SUBL
0.4000 mg | SUBLINGUAL_TABLET | SUBLINGUAL | Status: AC
  Administered 2021-02-23: 0.4 mg via SUBLINGUAL
  Filled 2021-02-23: qty 1

## 2021-02-23 MED ORDER — ACETAMINOPHEN 325 MG PO TABS
650.0000 mg | ORAL_TABLET | Freq: Four times a day (QID) | ORAL | Status: DC | PRN
  Administered 2021-02-24 – 2021-02-25 (×2): 650 mg via ORAL
  Filled 2021-02-23 (×2): qty 2

## 2021-02-23 MED ORDER — ACETAMINOPHEN 650 MG RE SUPP: 650.0000 mg | Freq: Four times a day (QID) | RECTAL | Status: DC | PRN

## 2021-02-23 NOTE — ED Triage Notes (Signed)
Pt BIB EMS from home for [facility] & low O2 sats at home. Pts O2 sats have been in the 80s all day, per daughter. Pt wears baseline O2 @ 3L/min [place] at home. Pt comes in on 25L O2.

## 2021-02-23 NOTE — Telephone Encounter (Signed)
°  Chief Complaint: O2 sat 64-74  Symptoms: Lips "Bluish" Frequency: For past 20 minutes. On O2 at 3 ls. Pertinent Negatives: Patient denies  Disposition: [x] ED /[] Urgent Care (no appt availability in office) / [] Appointment(In office/virtual)/ []  [place] [facility]/ [] Home Care/ [] Refused Recommended Disposition /[] [place] [facility]/ []  Follow-up with PCP Additional Notes: Advised to call 911. States will do so now.      Reason for Disposition  Cyanosis (bluish or gray lips or face)  Answer Assessment - Initial Assessment Questions 1. ACUTE COMPLAINT: "What is the main problem?" "Tell me what happened?"     O2 sat 64-74 last 20 minutes 2. AIRWAY: "Is he / she breathing?" (Yes, No, Unknown)     yes 3. BREATHING: "Is there difficulty breathing?" (Yes, No, Unknown)     no 4. CIRCULATION: "Is there any bleeding?" (Yes, No, Unknown)     yes 5. CONSCIOUS: "Is he/she awake, alert, and responding to you?" (Yes, No, Unknown)     yes  Protocols used: 911 Symptoms-A-AH

## 2021-02-23 NOTE — ED Notes (Signed)
Troponin 121-relayed to Dr. Jacqualine Code.

## 2021-02-23 NOTE — Telephone Encounter (Signed)
Called and gave verbal orders for the patient.

## 2021-02-23 NOTE — ED Notes (Signed)
COVID swab; gray on ice; lavender, blue, light green top tubes sent to lab.

## 2021-02-23 NOTE — ED Provider Notes (Signed)
[facility] Provider Note    Event Date/Time   First MD Initiated Contact with Patient 02/08/2021 2154     (approximate)   History   Shortness of Breath  EM caveat: Dyspnea, respiratory distress  HPI  Maria Neal is a 71 y.o. female  congestive heart failure with reduced EF of 20-25% status post AICD, chronic atrial fibrillation not on chronic anticoagulation due to history of GI bleed, COPD with chronic hypoxic respiratory failure on 3L oxygen at baseline, hypothyroidism, chronic hypotension on midodrine.   I reviewed the patient's admission and reviewed her discharge summary where she was treated for acute CHF exacerbation discharged on the 18th of this month      Physical Exam   Triage Vital Signs: ED Triage Vitals [02/21/2021 2151]  Enc Vitals Group     BP      Pulse      Resp      Temp      Temp src      SpO2 100 %     Weight      Height      Head Circumference      Peak Flow      Pain Score      Pain Loc      Pain Edu?      Excl. in [place]?     Most recent vital signs: Vitals:   02/25/2021 2230 02/18/2021 2300  BP: (!) 123/102 (!) 112/48  Pulse: 70 70  Resp: (!) 21 18  Temp:    SpO2: 99% 100%     General: Awake, but appears in have moderate increased work of breathing.  She has a very kyphotic hunched forward but reports its her normal position.  She is alert oriented able to converse but sure and very brief 1-2 word sentences appears distant CV:  Good peripheral perfusion.  Mild tachycardia.  No or murmurs or rubs are noted Resp:  Moderate increased work of breathing.  Moderate accessory muscle use.  No stridor.  She exhibits crackles in the bases bilaterally.  No expiratory wheezing.  Equal lung sounds bilaterally with moderate distress Abd:  No distention.  Other:  Moderate bilateral lower extremity peripheral edema pitting in nature   ED Results / Procedures / Treatments   Labs (all labs ordered are listed, but only abnormal  results are displayed) Labs Reviewed  BRAIN NATRIURETIC PEPTIDE - Abnormal; Notable for the following components:      Result Value   B Natriuretic Peptide >4,500.0 (*)    All other components within normal limits  CBC - Abnormal; Notable for the following components:   RBC 3.81 (*)    Hemoglobin 9.5 (*)    HCT 33.5 (*)    MCH 24.9 (*)    MCHC 28.4 (*)    RDW 21.4 (*)    Platelets 121 (*)    nRBC 2.3 (*)    All other components within normal limits  BASIC METABOLIC PANEL - Abnormal; Notable for the following components:   Chloride 91 (*)    Glucose, Bld 100 (*)    BUN 67 (*)    Creatinine, Ser 2.42 (*)    GFR, Estimated 21 (*)    All other components within normal limits  PROTIME-INR - Abnormal; Notable for the following components:   Prothrombin Time 17.8 (*)    INR 1.5 (*)    All other components within normal limits  TROPONIN I (HIGH SENSITIVITY) - Abnormal; Notable for  the following components:   Troponin I (High Sensitivity) 131 (*)    All other components within normal limits  RESP PANEL BY RT-PCR (FLU A&B, COVID) [id number]     EKG  Reviewed inter by me at 2335 Heart rate 79 QRS 209 QTc 500 Ventricular paced   RADIOLOGY DG Chest Portable 1 View  Result Date: 02/26/2021 CLINICAL DATA:  Shortness of breath, dyspnea, hypoxia. EXAM: PORTABLE CHEST 1 VIEW COMPARISON:  Portable chest 02/15/2021. FINDINGS: Severe cardiomegaly is again noted with perihilar vascular engorgement and flow cephalization without overt edema findings or pleural collections. The lungs are generally clear, but the lower lung fields are suboptimally evaluated due to the enlarged heart superimposing. Stable mediastinum with mild aortic tortuosity with calcification in the aortic arch. Again noted is a left chest pacing system with AID wiring, unchanged. Thoracic spondylosis. IMPRESSION: Stable cardiomegaly and vascular engorgement without overt edema. Lungs are clear of focal consolidation with the lower  lung fields suboptimally assessed due to superimposition of the enlarged heart. Electronically Signed   By: Telford Nab M.D.   On: 02/13/2021 22:32      I personally viewed the patient's chest x-ray and interpreted notable cardiomegaly with some vascular congestion.  Somewhat difficult to interpret, defer to radiologist for more detailed report.  Radiologist advised I do not see clear evidence of focal consolidation.    PROCEDURES:  Critical Care performed: Yes, see critical care procedure note(s) \ CRITICAL CARE Performed by: Delman Kitten   Total critical care time: 30 minutes  Critical care time was exclusive of separately billable procedures and treating other patients.  Critical care was necessary to treat or prevent imminent or life-threatening deterioration.  Critical care was time spent personally by me on the following activities: development of treatment plan with patient and/or surrogate as well as nursing, discussions with consultants, evaluation of patient's response to treatment, examination of patient, obtaining history from patient or surrogate, ordering and performing treatments and interventions, ordering and review of laboratory studies, ordering and review of radiographic studies, pulse oximetry and re-evaluation of patient's condition.   Procedures   MEDICATIONS ORDERED IN ED: Medications  furosemide (LASIX) injection 60 mg (has no administration in time range)  nitroGLYCERIN (NITROSTAT) SL tablet 0.4 mg (0.4 mg Sublingual Given 02/17/2021 2218)     IMPRESSION / MDM / ASSESSMENT AND PLAN / ED COURSE  I reviewed the triage vital signs and the nursing notes.                              Differential diagnosis includes, but is not limited to, acute flash pulmonary edema, CHF exacerbation, also recently admitted to the hospital similar.  In addition ACS, arrhythmia, pneumonia, pulmonary embolism etc. are all considered along with the broad differential of other  etiologies.  Her presentation today though initial assessment seems concerning for CHF with elevated systolic and diastolic hypertension, dyspnea Rales in her lung zones and at least moderate 3+ pitting edema to the level of the knees bilaterally.  We will initiate nitrates, BiPAP, and obtain further work-up at this time.  She does not have a fever.  She is alert oriented but is requiring nonrebreather to maintain saturations in the high 90s, far above her typical baseline.    The patient is on the cardiac monitor to evaluate for evidence of arrhythmia and/or significant heart rate changes.  Primarily ventricular pacing  Personally reviewed the patient's labs very notable for  a significantly elevated BNP, mild elevation of creatinine from baseline, chronic anemia.  Normal white count.  Negative COVID test.  Mildly elevated troponin likely significant finding of demand ischemia similar to previous hospitalization  Clinical Course as of 02/22/2021 2333  Wed Feb 23, 2021  2245 And alert, tolerating BiPAP well.  Taking volumes about 8 L/min. [MQ]  2245   She sits with a very kyphotic position, but alert orients to voice and she reports her shortness of breath feels improved. [MQ]    Clinical Course User Index [MQ] Delman Kitten, MD   ----------------------------------------- 11:33 PM on 02/20/2021 ----------------------------------------- Patient continues to do well on BiPAP.  We will admit.  Have consulted the hospitalist service Dr. Eugenia Pancoast agreeable with admission to hospitalist service  FINAL CLINICAL IMPRESSION(S) / ED DIAGNOSES   Final diagnoses:  Acute on chronic congestive heart failure, unspecified heart failure type [facility])     Rx / DC Orders   ED Discharge Orders     None        Note:  This document was prepared using Dragon voice recognition software and may include unintentional dictation errors.   Delman Kitten, MD 02/07/2021 2342

## 2021-02-23 NOTE — Telephone Encounter (Signed)
Called and LVM giving verbal orders for the patient.

## 2021-02-24 ENCOUNTER — Inpatient Hospital Stay: Payer: Medicare Other

## 2021-02-24 ENCOUNTER — Inpatient Hospital Stay: Payer: Medicare Other | Admitting: Nurse Practitioner

## 2021-02-24 ENCOUNTER — Encounter: Payer: Self-pay | Admitting: Internal Medicine

## 2021-02-24 DIAGNOSIS — E876 Hypokalemia: Secondary | ICD-10-CM

## 2021-02-24 DIAGNOSIS — N1832 Chronic kidney disease, stage 3b: Secondary | ICD-10-CM | POA: Diagnosis present

## 2021-02-24 DIAGNOSIS — I5023 Acute on chronic systolic (congestive) heart failure: Secondary | ICD-10-CM | POA: Diagnosis not present

## 2021-02-24 DIAGNOSIS — N179 Acute kidney failure, unspecified: Secondary | ICD-10-CM | POA: Diagnosis present

## 2021-02-24 LAB — COMPREHENSIVE METABOLIC PANEL
ALT: 21 U/L (ref 0–44)
AST: 23 U/L (ref 15–41)
Albumin: 2.4 g/dL — ABNORMAL LOW (ref 3.5–5.0)
Alkaline Phosphatase: 67 U/L (ref 38–126)
Anion gap: 11 (ref 5–15)
BUN: 63 mg/dL — ABNORMAL HIGH (ref 8–23)
CO2: 30 mmol/L (ref 22–32)
Calcium: 8 mg/dL — ABNORMAL LOW (ref 8.9–10.3)
Chloride: 98 mmol/L (ref 98–111)
Creatinine, Ser: 2.24 mg/dL — ABNORMAL HIGH (ref 0.44–1.00)
GFR, Estimated: 23 mL/min — ABNORMAL LOW (ref >60)
Glucose, Bld: 103 mg/dL — ABNORMAL HIGH (ref 70–99)
Potassium: 3.4 mmol/L — ABNORMAL LOW (ref 3.5–5.1)
Sodium: 139 mmol/L (ref 135–145)
Total Bilirubin: 2.2 mg/dL — ABNORMAL HIGH (ref 0.3–1.2)
Total Protein: 5.3 g/dL — ABNORMAL LOW (ref 6.5–8.1)

## 2021-02-24 LAB — CBC WITH DIFFERENTIAL/PLATELET
Abs Immature Granulocytes: 0.04 10*3/uL (ref 0.00–0.07)
Basophils Absolute: 0 10*3/uL (ref 0.0–0.1)
Basophils Relative: 0 %
Eosinophils Absolute: 0 10*3/uL (ref 0.0–0.5)
Eosinophils Relative: 0 %
HCT: 30.6 % — ABNORMAL LOW (ref 36.0–46.0)
Hemoglobin: 8.8 g/dL — ABNORMAL LOW (ref 12.0–15.0)
Immature Granulocytes: 0 %
Lymphocytes Relative: 8 %
Lymphs Abs: 0.7 10*3/uL (ref 0.7–4.0)
MCH: 25.4 pg — ABNORMAL LOW (ref 26.0–34.0)
MCHC: 28.8 g/dL — ABNORMAL LOW (ref 30.0–36.0)
MCV: 88.2 fL (ref 80.0–100.0)
Monocytes Absolute: 0.6 10*3/uL (ref 0.1–1.0)
Monocytes Relative: 6 %
Neutro Abs: 7.8 10*3/uL — ABNORMAL HIGH (ref 1.7–7.7)
Neutrophils Relative %: 86 %
Platelets: 94 10*3/uL — ABNORMAL LOW (ref 150–400)
RBC: 3.47 MIL/uL — ABNORMAL LOW (ref 3.87–5.11)
RDW: 21.3 % — ABNORMAL HIGH (ref 11.5–15.5)
Smear Review: NORMAL
WBC: 9.1 10*3/uL (ref 4.0–10.5)
nRBC: 0.5 % — ABNORMAL HIGH (ref 0.0–0.2)

## 2021-02-24 LAB — BLOOD GAS, VENOUS
Acid-Base Excess: 10.7 mmol/L — ABNORMAL HIGH (ref 0.0–2.0)
Acid-Base Excess: 9.6 mmol/L — ABNORMAL HIGH (ref 0.0–2.0)
Bicarbonate: 38.5 mmol/L — ABNORMAL HIGH (ref 20.0–28.0)
Bicarbonate: 36.9 mmol/L — ABNORMAL HIGH (ref 20.0–28.0)
O2 Content: 15 L/min
O2 Saturation: 44.7 %
O2 Saturation: 94.1 %
Patient temperature: 37
Patient temperature: 37
pCO2, Ven: 65 mmHg — ABNORMAL HIGH (ref 44–60)
pCO2, Ven: 61 mmHg — ABNORMAL HIGH (ref 44–60)
pH, Ven: 7.38 (ref 7.25–7.43)
pH, Ven: 7.39 (ref 7.25–7.43)
pO2, Ven: <31 mmHg — CL (ref 32–45)
pO2, Ven: 67 mmHg — ABNORMAL HIGH (ref 32–45)

## 2021-02-24 LAB — LACTIC ACID, PLASMA: Lactic Acid, Venous: 1.9 mmol/L (ref 0.5–1.9)

## 2021-02-24 LAB — MAGNESIUM
Magnesium: 1.8 mg/dL (ref 1.7–2.4)
Magnesium: 1.6 mg/dL — ABNORMAL LOW (ref 1.7–2.4)
Magnesium: 1.9 mg/dL (ref 1.7–2.4)

## 2021-02-24 LAB — D-DIMER, QUANTITATIVE: D-Dimer, Quant: 3.04 ug/mL-FEU — ABNORMAL HIGH (ref 0.00–0.50)

## 2021-02-24 LAB — URINALYSIS, COMPLETE (UACMP) WITH MICROSCOPIC
Bilirubin Urine: NEGATIVE
Glucose, UA: NEGATIVE mg/dL
Hgb urine dipstick: NEGATIVE
Ketones, ur: NEGATIVE mg/dL
Nitrite: NEGATIVE
Protein, ur: NEGATIVE mg/dL
Specific Gravity, Urine: 1.009 (ref 1.005–1.030)
pH: 5 (ref 5.0–8.0)

## 2021-02-24 LAB — TROPONIN I (HIGH SENSITIVITY)
Troponin I (High Sensitivity): 144 ng/L (ref <18)
Troponin I (High Sensitivity): 1381 ng/L
Troponin I (High Sensitivity): 2009 ng/L
Troponin I (High Sensitivity): 2480 ng/L (ref <18)

## 2021-02-24 LAB — PROCALCITONIN: Procalcitonin: 2.2 ng/mL

## 2021-02-24 LAB — POTASSIUM: Potassium: 4.3 mmol/L (ref 3.5–5.1)

## 2021-02-24 LAB — GLUCOSE, CAPILLARY: Glucose-Capillary: 145 mg/dL — ABNORMAL HIGH (ref 70–99)

## 2021-02-24 LAB — CREATININE, URINE, RANDOM: Creatinine, Urine: 49 mg/dL

## 2021-02-24 LAB — PHOSPHORUS: Phosphorus: 3 mg/dL (ref 2.5–4.6)

## 2021-02-24 LAB — SODIUM, URINE, RANDOM: Sodium, Ur: 47 mmol/L

## 2021-02-24 MED ORDER — LACTATED RINGERS IV BOLUS
500.0000 mL | Freq: Once | INTRAVENOUS | Status: AC
  Administered 2021-02-24: 500 mL via INTRAVENOUS

## 2021-02-24 MED ORDER — FUROSEMIDE 10 MG/ML IJ SOLN
60.0000 mg | Freq: Two times a day (BID) | INTRAMUSCULAR | Status: DC
  Administered 2021-02-24: 60 mg via INTRAVENOUS
  Filled 2021-02-24: qty 8

## 2021-02-24 MED ORDER — LEVOTHYROXINE SODIUM 50 MCG PO TABS
175.0000 ug | ORAL_TABLET | Freq: Every day | ORAL | Status: DC
  Administered 2021-02-24 – 2021-02-27 (×4): 175 ug via ORAL
  Filled 2021-02-24 (×3): qty 2
  Filled 2021-02-24: qty 4

## 2021-02-24 MED ORDER — FLUTICASONE FUROATE-VILANTEROL 100-25 MCG/ACT IN AEPB
1.0000 | INHALATION_SPRAY | Freq: Every day | RESPIRATORY_TRACT | Status: DC
  Administered 2021-02-25 – 2021-02-26 (×2): 1 via RESPIRATORY_TRACT
  Filled 2021-02-24 (×2): qty 28

## 2021-02-24 MED ORDER — POTASSIUM CHLORIDE CRYS ER 20 MEQ PO TBCR
40.0000 meq | EXTENDED_RELEASE_TABLET | Freq: Once | ORAL | Status: AC
  Administered 2021-02-24: 40 meq via ORAL
  Filled 2021-02-24: qty 2

## 2021-02-24 MED ORDER — CHLORHEXIDINE GLUCONATE CLOTH 2 % EX PADS
6.0000 | MEDICATED_PAD | Freq: Every day | CUTANEOUS | Status: DC
  Administered 2021-02-24 – 2021-02-25 (×2): 6 via TOPICAL

## 2021-02-24 MED ORDER — NOREPINEPHRINE 4 MG/250ML-% IV SOLN
2.0000 ug/min | INTRAVENOUS | Status: DC
  Administered 2021-02-24: 10 ug/min via INTRAVENOUS
  Administered 2021-02-24: 2 ug/min via INTRAVENOUS
  Administered 2021-02-25: 10 ug/min via INTRAVENOUS
  Administered 2021-02-25: 6 ug/min via INTRAVENOUS
  Administered 2021-02-26: 2 ug/min via INTRAVENOUS
  Administered 2021-02-26: 8 ug/min via INTRAVENOUS
  Filled 2021-02-24 (×6): qty 250

## 2021-02-24 MED ORDER — ALBUTEROL SULFATE (2.5 MG/3ML) 0.083% IN NEBU: 2.5000 mg | INHALATION_SOLUTION | RESPIRATORY_TRACT | Status: DC | PRN

## 2021-02-24 MED ORDER — POTASSIUM CHLORIDE CRYS ER 20 MEQ PO TBCR
40.0000 meq | EXTENDED_RELEASE_TABLET | Freq: Every day | ORAL | Status: DC
Start: 2021-02-24 — End: 2021-02-25
  Administered 2021-02-24: 40 meq via ORAL
  Filled 2021-02-24: qty 2

## 2021-02-24 MED ORDER — MAGNESIUM SULFATE 2 GM/50ML IV SOLN
2.0000 g | Freq: Once | INTRAVENOUS | Status: AC
  Administered 2021-02-24: 2 g via INTRAVENOUS
  Filled 2021-02-24: qty 50

## 2021-02-24 MED ORDER — UMECLIDINIUM BROMIDE 62.5 MCG/ACT IN AEPB
1.0000 | INHALATION_SPRAY | Freq: Every day | RESPIRATORY_TRACT | Status: DC
  Administered 2021-02-25 – 2021-02-26 (×2): 1 via RESPIRATORY_TRACT
  Filled 2021-02-24 (×2): qty 7

## 2021-02-24 MED ORDER — FLUTICASONE PROPIONATE 50 MCG/ACT NA SUSP
2.0000 | Freq: Every day | NASAL | Status: DC | PRN
  Filled 2021-02-24: qty 16

## 2021-02-24 MED ORDER — MIDODRINE HCL 5 MG PO TABS
10.0000 mg | ORAL_TABLET | Freq: Three times a day (TID) | ORAL | Status: DC
  Administered 2021-02-24 – 2021-02-26 (×8): 10 mg via ORAL
  Filled 2021-02-24 (×8): qty 2

## 2021-02-24 MED ORDER — SODIUM CHLORIDE 0.9 % IV SOLN
1.0000 g | INTRAVENOUS | Status: DC
  Administered 2021-02-24: 1 g via INTRAVENOUS
  Filled 2021-02-24 (×2): qty 10

## 2021-02-24 MED ORDER — PANTOPRAZOLE SODIUM 40 MG PO TBEC
40.0000 mg | DELAYED_RELEASE_TABLET | Freq: Every day | ORAL | Status: DC
  Administered 2021-02-24 – 2021-02-26 (×3): 40 mg via ORAL
  Filled 2021-02-24 (×3): qty 1

## 2021-02-24 MED ORDER — POLYSACCHARIDE IRON COMPLEX 150 MG PO CAPS
150.0000 mg | ORAL_CAPSULE | Freq: Every day | ORAL | Status: DC
  Administered 2021-02-24 – 2021-02-26 (×3): 150 mg via ORAL
  Filled 2021-02-24 (×4): qty 1

## 2021-02-24 MED ORDER — SODIUM CHLORIDE 0.9 % IV SOLN: 250.0000 mL | INTRAVENOUS | Status: DC

## 2021-02-24 MED ORDER — AMIODARONE HCL 200 MG PO TABS
100.0000 mg | ORAL_TABLET | Freq: Every day | ORAL | Status: DC
  Administered 2021-02-24 – 2021-02-26 (×3): 100 mg via ORAL
  Filled 2021-02-24 (×3): qty 1

## 2021-02-24 MED ORDER — ORAL CARE MOUTH RINSE
15.0000 mL | Freq: Two times a day (BID) | OROMUCOSAL | Status: DC
  Administered 2021-02-25 – 2021-02-26 (×4): 15 mL via OROMUCOSAL

## 2021-02-24 MED ORDER — CARVEDILOL 6.25 MG PO TABS
3.1250 mg | ORAL_TABLET | Freq: Two times a day (BID) | ORAL | Status: DC
Start: 2021-02-24 — End: 2021-02-24
  Administered 2021-02-24: 3.125 mg via ORAL
  Filled 2021-02-24: qty 1

## 2021-02-24 NOTE — Assessment & Plan Note (Signed)
Supplemented 

## 2021-02-24 NOTE — Consult Note (Cosign Needed)
[place] NOTE       Patient ID: ARASELI SHERRY MRN: [id number] DOB/AGE: May 11, 1950 71 y.o.  Admit date: 03/01/2021 Referring Physician Donell Beers, NP Primary Physician Jon Billings, NP Primary Cardiologist Dr. Saralyn Pilar Reason for Consultation CHF  HPI: The patient is a 71 year old female with a past medical history of nonischemic dilated cardiomyopathy, HFrEF (LVEF 25%) s/p CRT-D 11/2014 with change out 09/2020, paroxysmal atrial fibrillation s/p cardioversion 08/2020 not on anticoagulation due to history of GI bleeding, COPD on 3 L at baseline, CKD 4 who was recently hospitalized for 4 days at [facility] for acute on chronic CHF and discharged on 2/18 after diuresis on an increased dose of torsemide. She presented to [facility] ED 02/09/2021 with reported worsening shortness of breath initially requiring BiPAP and is now on max dose of Levophed. Cardiology is consulted for assistance.  The patient presents with her brother who is the primary historian during this encounter.  Upon interview this afternoon around 1600, the patient is very somnolent, on high flow nasal cannula and arouses to painful stimuli but cannot stay awake long enough to answer any of my questions or provide history.  Her brother states the patient fell on her day of discharge 2/18, unclear if she hit her head or what other events transpired during that time.  He states that the patient has been very weak and sleeping most of the days over the past week with worsening swelling in her lower legs apparently.  She has been living with her daughter who also has 3 children to care for at home.  Per review of ER notes, EMS who noted her to be saturating in the low 80s on her baseline 3 L.  She was placed on 15 L by nonrebreather by EMS and eventually required BiPAP.  Her blood pressure was initially 152/117 yesterday evening but she has been hypotensive since this morning, now requiring Levophed in addition to her home  midodrine for blood pressure support and BP 90/47 during interview.  She is also given Lasix 60 mg x 2 with no output recorded so far.  Labs on admission are notable for potassium decreasing from 4.1-3.4, creatinine 2.24, EGFR 23 (at baseline), mag 1.6.  Troponin uptrending [phone].  BNP significantly elevated to greater than 4500 (was 3624 8 days ago), procalcitonin 2.2.  WBCs 9, H&H 8.8/30.6, platelets significantly decreased to 94.  Urine shows trace leuks and rare bacteria, hyaline cast present.  D-dimer notably elevated to 3.04.   Chest x-ray significant for cardiomegaly with central pulmonary vessels more prominent, possibly suggesting mild CHF.  Without significant pleural effusion, pneumo, focal pulmonary consolidation.  Review of systems complete and found to be negative unless listed above   Past Medical History:  Diagnosis Date   Acute appendicitis 02/17/2018   AICD (automatic cardioverter/defibrillator) present    Anxiety state    Arrhythmia    atrial fibrillation   CAD (coronary artery disease)    Cardiomyopathy (HCC)    CHF (congestive heart failure) (HCC)    COPD (chronic obstructive pulmonary disease) ([place])    Dyspnea    Headache    History of kidney stones    History of shingles    Hypertension    Influenza A 11/04/2020   On home oxygen therapy    bedtime and prn   Restless leg syndrome     Past Surgical History:  Procedure Laterality Date   CARDIAC CATHETERIZATION  04/2014   Dr. Idelle Leech   CARDIAC CATHETERIZATION  CARDIAC DEFIBRILLATOR PLACEMENT     CARDIOVERSION N/A 08/11/2020   Procedure: CARDIOVERSION;  Surgeon: Dixie Dials, MD;  Location: [facility] ENDOSCOPY;  Service: Cardiovascular;  Laterality: N/A;   CHOLECYSTECTOMY N/A 08/23/2016   Procedure: LAPAROSCOPIC CHOLECYSTECTOMY;  Surgeon: Clayburn Pert, MD;  Location: [facility] ORS;  Service: General;  Laterality: N/A;   COLONOSCOPY WITH PROPOFOL N/A 12/15/2020   Procedure: COLONOSCOPY WITH PROPOFOL;   Surgeon: Lin Landsman, MD;  Location: [facility] ENDOSCOPY;  Service: Gastroenterology;  Laterality: N/A;   COLONOSCOPY WITH PROPOFOL N/A 12/16/2020   Procedure: COLONOSCOPY WITH PROPOFOL;  Surgeon: Lucilla Lame, MD;  Location: [facility] ENDOSCOPY;  Service: Endoscopy;  Laterality: N/A;   DILATATION & CURETTAGE/HYSTEROSCOPY WITH MYOSURE N/A 06/27/2018   Procedure: DILATATION & CURETTAGE/HYSTEROSCOPY WITH MYOSURE;  Surgeon: Malachy Mood, MD;  Location: [facility] ORS;  Service: Gynecology;  Laterality: N/A;   ESOPHAGOGASTRODUODENOSCOPY N/A 12/15/2020   Procedure: ESOPHAGOGASTRODUODENOSCOPY (EGD);  Surgeon: Lin Landsman, MD;  Location: [facility] ENDOSCOPY;  Service: Gastroenterology;  Laterality: N/A;   HERNIA REPAIR     LAPAROSCOPIC APPENDECTOMY N/A 02/17/2018   Procedure: APPENDECTOMY LAPAROSCOPIC;  Surgeon: Benjamine Sprague, DO;  Location: [facility] ORS;  Service: General;  Laterality: N/A;   PPM GENERATOR CHANGEOUT N/A 09/14/2020   Procedure: PPM GENERATOR CHANGEOUT;  Surgeon: Isaias Cowman, MD;  Location: [place] CV LAB;  Service: Cardiovascular;  Laterality: N/A;   TEE WITHOUT CARDIOVERSION N/A 08/10/2020   Procedure: TRANSESOPHAGEAL ECHOCARDIOGRAM (TEE);  Surgeon: Dixie Dials, MD;  Location: [facility] ENDOSCOPY;  Service: Cardiovascular;  Laterality: N/A;    (Not in a hospital admission)  Social History   Socioeconomic History   Marital status: Divorced    Spouse name: Not on file   Number of children: Not on file   Years of education: Not on file   Highest education level: High school graduate  Occupational History   Occupation: retired  Tobacco Use   Smoking status: Former    Packs/day: 0.25    Types: Cigarettes   Smokeless tobacco: Never   Tobacco comments:    quit at age 9, whole pack lasted 3 weeks   Vaping Use   Vaping Use: Never used  Substance and Sexual Activity   Alcohol use: No    Alcohol/week: 0.0 standard drinks   Drug use: No   Sexual activity: Not Currently     Birth control/protection: None  Other Topics Concern   Not on file  Social History Narrative   Not on file   Social Determinants of Health   Financial Resource Strain: Not on file  Food Insecurity: Not on file  Transportation Needs: Not on file  Physical Activity: Not on file  Stress: Not on file  Social Connections: Not on file  Intimate Partner Violence: Not on file    Family History  Problem Relation Age of Onset   Diabetes Mellitus II Mother    Hypertension Mother    Heart attack Father    Arthritis Sister    Arthritis Sister    Thyroid disease Brother    Heart disease Brother    Diabetes Brother    Depression Daughter    Depression Son    Schizophrenia Son    Breast cancer Neg Hx       Review of systems complete and found to be negative unless listed above    PHYSICAL EXAM General: Elderly and acutely ill-appearing Caucasian female, very somnolent, spontaneously moves and grunts to painful stimuli but unable to be aroused long enough to answer questions. HEENT:  Normocephalic  and atraumatic. Neck:  No JVD.  Lungs: Tachypneic on high flow nasal cannula.  Coarse breath sounds to auscultation anteriorly  heart: HRRR . Normal S1 and S2 without gallops.  3/6 holosystolic murmur.  Radial & DP pulses 2+ bilaterally. Abdomen: Soft, distended, linear ecchymosis along her lower abdomen.  Msk: Normal strength and tone for age. Extremities: Warm and well perfused.  Left inner forearm with erythema and raised area.  2+ bilateral lower extremity edema up to her mid thigh Neuro: Somnolent, unable to assess orientation.  Moves all extremities spontaneously and groans to painful stimuli. Psych: Unable to assess due to somnolence  Labs:   Lab Results  Component Value Date   WBC 9.1 02/24/2021   HGB 8.8 (L) 02/24/2021   HCT 30.6 (L) 02/24/2021   MCV 88.2 02/24/2021   PLT 94 (L) 02/24/2021    Recent Labs  Lab 02/24/21 0527  NA 139  K 3.4*  CL 98  CO2 30  BUN 63*   CREATININE 2.24*  CALCIUM 8.0*  PROT 5.3*  BILITOT 2.2*  ALKPHOS 67  ALT 21  AST 23  GLUCOSE 103*   Lab Results  Component Value Date   TROPONINI <0.03 09/02/2014    Lab Results  Component Value Date   CHOL 124 09/23/2020   CHOL 202 (H) 03/10/2020   CHOL 81 (L) 03/14/2019   Lab Results  Component Value Date   HDL 51 09/23/2020   HDL 49 03/10/2020   HDL 41 03/14/2019   Lab Results  Component Value Date   LDLCALC 53 09/23/2020   LDLCALC 128 (H) 03/10/2020   LDLCALC 23 03/14/2019   Lab Results  Component Value Date   TRIG 113 09/23/2020   TRIG 140 03/10/2020   TRIG 80 03/14/2019   Lab Results  Component Value Date   CHOLHDL 2.4 09/23/2020   CHOLHDL 4.1 03/10/2020   No results found for: LDLDIRECT    Radiology: [facility] Chest Port 1 View  Result Date: 02/24/2021 CLINICAL DATA:  Difficulty breathing, hypoxia EXAM: PORTABLE CHEST 1 VIEW COMPARISON:  Previous studies including the examination of 02/15/2021 FINDINGS: Transverse diameter of heart is increased. Pacemaker/defibrillator battery is seen in the left infraclavicular region. Biventricular pacer leads are noted in place. Central pulmonary vessels are slightly more prominent. There is no focal pulmonary consolidation. There is no significant pleural effusion or pneumothorax. IMPRESSION: Cardiomegaly. Central pulmonary vessels are more prominent, possibly suggesting mild CHF. No new focal infiltrates are seen. Electronically Signed   By: Elmer Picker M.D.   On: 02/24/2021 14:48   DG Chest Portable 1 View  Result Date: 02/22/2021 CLINICAL DATA:  Shortness of breath, dyspnea, hypoxia. EXAM: PORTABLE CHEST 1 VIEW COMPARISON:  Portable chest 02/15/2021. FINDINGS: Severe cardiomegaly is again noted with perihilar vascular engorgement and flow cephalization without overt edema findings or pleural collections. The lungs are generally clear, but the lower lung fields are suboptimally evaluated due to the enlarged heart  superimposing. Stable mediastinum with mild aortic tortuosity with calcification in the aortic arch. Again noted is a left chest pacing system with AID wiring, unchanged. Thoracic spondylosis. IMPRESSION: Stable cardiomegaly and vascular engorgement without overt edema. Lungs are clear of focal consolidation with the lower lung fields suboptimally assessed due to superimposition of the enlarged heart. Electronically Signed   By: Telford Nab M.D.   On: 02/08/2021 22:32   DG Chest Portable 1 View  Result Date: 02/15/2021 CLINICAL DATA:  Short of breath and leg swelling and weakness EXAM: PORTABLE CHEST  1 VIEW COMPARISON:  01/10/2021 FINDINGS: Cardiac enlargement. AICD unchanged in position. Mild vascular congestion. Negative for edema or effusion. Mild bibasilar atelectasis. IMPRESSION: Mild pulmonary vascular congestion without edema. Mild bibasilar atelectasis. Marked cardiomegaly. Electronically Signed   By: Franchot Gallo M.D.   On: 02/15/2021 10:42   US BREAST LTD UNI LEFT INC AXILLA  Result Date: 02/02/2021 CLINICAL DATA:  Left breast asymmetry and calcifications seen on patient's new baseline mammogram. EXAM: DIGITAL DIAGNOSTIC UNILATERAL LEFT MAMMOGRAM WITH TOMOSYNTHESIS AND CAD; ULTRASOUND LEFT BREAST LIMITED TECHNIQUE: Left digital diagnostic mammography and breast tomosynthesis was performed. The images were evaluated with computer-aided detection.; Targeted ultrasound examination of the left breast was performed. COMPARISON:  Previous exam(s). ACR Breast Density Category c: The breast tissue is heterogeneously dense, which may obscure small masses. FINDINGS: Additional mammographic views of the left breast demonstrate loosely grouped punctate calcifications in the slightly lower outer left breast, middle and anterior depth. The area of calcifications spans 6.6 cm in anterior to posterior dimension. No associated masses are seen. Targeted left breast ultrasound is performed demonstrating 2  probably benign cysts at 2:30 o'clock 3 cm from the nipple measuring 0.6 x 0.4 x 0.5 cm and 0.5 x 0.5 x 0.5 cm. IMPRESSION: Loosely grouped calcifications in the left breast are mildly suspicious. Recommend 1 site stereotactic core needle biopsy of the anterior or posterior extent of involvement. Two probably benign left breast 2:30 o'clock masses. With benign pathology results, recommend six-month follow-up for the remaining of the calcifications and the probably benign left breast masses. RECOMMENDATION: One site stereotactic core needle biopsy of the left breast. I have discussed the findings and recommendations with the patient. If applicable, a reminder letter will be sent to the patient regarding the next appointment. BI-RADS CATEGORY  4: Suspicious. Electronically Signed   By: Fidela Salisbury M.D.   On: 02/02/2021 11:12  MM DIAG BREAST TOMO UNI LEFT  Result Date: 02/02/2021 CLINICAL DATA:  Left breast asymmetry and calcifications seen on patient's new baseline mammogram. EXAM: DIGITAL DIAGNOSTIC UNILATERAL LEFT MAMMOGRAM WITH TOMOSYNTHESIS AND CAD; ULTRASOUND LEFT BREAST LIMITED TECHNIQUE: Left digital diagnostic mammography and breast tomosynthesis was performed. The images were evaluated with computer-aided detection.; Targeted ultrasound examination of the left breast was performed. COMPARISON:  Previous exam(s). ACR Breast Density Category c: The breast tissue is heterogeneously dense, which may obscure small masses. FINDINGS: Additional mammographic views of the left breast demonstrate loosely grouped punctate calcifications in the slightly lower outer left breast, middle and anterior depth. The area of calcifications spans 6.6 cm in anterior to posterior dimension. No associated masses are seen. Targeted left breast ultrasound is performed demonstrating 2 probably benign cysts at 2:30 o'clock 3 cm from the nipple measuring 0.6 x 0.4 x 0.5 cm and 0.5 x 0.5 x 0.5 cm. IMPRESSION: Loosely grouped  calcifications in the left breast are mildly suspicious. Recommend 1 site stereotactic core needle biopsy of the anterior or posterior extent of involvement. Two probably benign left breast 2:30 o'clock masses. With benign pathology results, recommend six-month follow-up for the remaining of the calcifications and the probably benign left breast masses. RECOMMENDATION: One site stereotactic core needle biopsy of the left breast. I have discussed the findings and recommendations with the patient. If applicable, a reminder letter will be sent to the patient regarding the next appointment. BI-RADS CATEGORY  4: Suspicious. Electronically Signed   By: Fidela Salisbury M.D.   On: 02/02/2021 11:12  ECHOCARDIOGRAM COMPLETE  Result Date: 02/16/2021    ECHOCARDIOGRAM  REPORT   Patient Name:   ERICIA MOXLEY Date of Exam: 02/15/2021 Medical Rec #:  [id number]    Height:       65.7 in Accession #:    [id number]   Weight:       161.0 lb Date of Birth:  [birth year]    BSA:          1.819 m Patient Age:    69 years     BP:           112/54 mmHg Patient Gender: F            HR:           73 bpm. Exam Location:  [facility] Procedure: 2D Echo, Cardiac Doppler and Color Doppler Indications:     CHF-acute systolic V25.36  History:         Patient has prior history of Echocardiogram examinations, most                  recent 08/10/2020. CAD, AICD, COPD; Risk Factors:Hypertension.  Sonographer:     Sherrie Sport Referring Phys:  [place] Diagnosing Phys: Serafina Royals MD  Sonographer Comments: Suboptimal apical window. IMPRESSIONS  1. Left ventricular ejection fraction, by estimation, is <20%. The left ventricle has severely decreased function. The left ventricle demonstrates global hypokinesis. The left ventricular internal cavity size was severely dilated. There is mild left ventricular hypertrophy. Left ventricular diastolic parameters are consistent with Grade II diastolic dysfunction (pseudonormalization).  2. Right ventricular  systolic function is mildly reduced. The right ventricular size is moderately enlarged.  3. Left atrial size was mildly dilated.  4. Right atrial size was mildly dilated.  5. The mitral valve is normal in structure. Severe mitral valve regurgitation.  6. Tricuspid valve regurgitation is moderate.  7. The aortic valve is normal in structure. Aortic valve regurgitation is trivial. FINDINGS  Left Ventricle: Left ventricular ejection fraction, by estimation, is <20%. The left ventricle has severely decreased function. The left ventricle demonstrates global hypokinesis. The left ventricular internal cavity size was severely dilated. There is mild left ventricular hypertrophy. Left ventricular diastolic parameters are consistent with Grade II diastolic dysfunction (pseudonormalization). Right Ventricle: The right ventricular size is moderately enlarged. No increase in right ventricular wall thickness. Right ventricular systolic function is mildly reduced. Left Atrium: Left atrial size was mildly dilated. Right Atrium: Right atrial size was mildly dilated. Pericardium: There is no evidence of pericardial effusion. Mitral Valve: The mitral valve is normal in structure. Severe mitral valve regurgitation. MV peak gradient, 6.0 mmHg. The mean mitral valve gradient is 2.0 mmHg. Tricuspid Valve: The tricuspid valve is normal in structure. Tricuspid valve regurgitation is moderate. Aortic Valve: The aortic valve is normal in structure. Aortic valve regurgitation is trivial. Aortic regurgitation PHT measures 394 msec. Aortic valve mean gradient measures 3.7 mmHg. Aortic valve peak gradient measures 6.7 mmHg. Aortic valve area, by VTI measures 1.50 cm. Pulmonic Valve: The pulmonic valve was normal in structure. Pulmonic valve regurgitation is trivial. Aorta: The aortic root and ascending aorta are structurally normal, with no evidence of dilitation. IAS/Shunts: No atrial level shunt detected by color flow Doppler.  LEFT VENTRICLE  PLAX 2D LVIDd:         7.10 cm      Diastology LVIDs:         6.70 cm      LV e' medial:    6.20 cm/s LV PW:  1.10 cm      LV E/e' medial:  14.6 LV IVS:        0.80 cm      LV e' lateral:   7.83 cm/s LVOT diam:     2.00 cm      LV E/e' lateral: 11.6 LV SV:         30 LV SV Index:   17 LVOT Area:     3.14 cm  LV Volumes (MOD) LV vol d, MOD A2C: 221.0 ml LV vol d, MOD A4C: 258.0 ml LV vol s, MOD A2C: 99.7 ml LV vol s, MOD A4C: 179.0 ml LV SV MOD A2C:     121.3 ml LV SV MOD A4C:     258.0 ml LV SV MOD BP:      105.8 ml RIGHT VENTRICLE RV Basal diam:  6.60 cm RV S prime:     9.46 cm/s TAPSE (M-mode): 2.3 cm LEFT ATRIUM              Index        RIGHT ATRIUM           Index LA diam:        6.10 cm  3.35 cm/m   RA Area:     29.60 cm LA Vol (A2C):   170.0 ml 93.46 ml/m  RA Volume:   109.00 ml 59.92 ml/m LA Vol (A4C):   163.0 ml 89.61 ml/m LA Biplane Vol: 173.0 ml 95.11 ml/m  AORTIC VALVE AV Area (Vmax):    1.16 cm AV Area (Vmean):   1.21 cm AV Area (VTI):     1.50 cm AV Vmax:           129.33 cm/s AV Vmean:          86.467 cm/s AV VTI:            0.202 m AV Peak Grad:      6.7 mmHg AV Mean Grad:      3.7 mmHg LVOT Vmax:         47.60 cm/s LVOT Vmean:        33.400 cm/s LVOT VTI:          0.097 m LVOT/AV VTI ratio: 0.48 AI PHT:            394 msec  AORTA Ao Root diam: 2.90 cm MITRAL VALVE               TRICUSPID VALVE MV Area (PHT): 3.77 cm    TR Peak grad:   40.4 mmHg MV Area VTI:   1.36 cm    TR Vmax:        318.00 cm/s MV Peak grad:  6.0 mmHg MV Mean grad:  2.0 mmHg    SHUNTS MV Vmax:       1.22 m/s    Systemic VTI:  0.10 m MV Vmean:      67.0 cm/s   Systemic Diam: 2.00 cm MV Decel Time: 201 msec MV E velocity: 90.80 cm/s MV A velocity: 44.60 cm/s MV E/A ratio:  2.04 Serafina Royals MD Electronically signed by Serafina Royals MD Signature Date/Time: 02/16/2021/8:07:09 AM    Final     ECHO LVEF less than 20%, severe MR, G2 DD 02/15/2021  TELEMETRY reviewed by me: V paced rates 70s - 80s  EKG reviewed  by me: V paced at 74 without acute ST changes  ASSESSMENT AND PLAN:  The patient is a 71 year old female with a past medical  history of nonischemic dilated cardiomyopathy, HFrEF (LVEF 25%) s/p CRT-D 11/2014 with change out 09/2020, paroxysmal atrial fibrillation s/p cardioversion 08/2020 not on anticoagulation due to history of GI bleeding, COPD on 3 L at baseline, CKD 4 who was recently hospitalized for 4 days at [facility] for acute on chronic CHF and discharged on 2/18 after appropriate diuresis on an increased dose of torsemide. She presented to [facility] ED 02/10/2021 with shortness of breath.  Cardiology is consulted for assistance with her heart failure.  #Acute on chronic HFrEF (02/15/21 LVEF <20%, severe MR, g2 DD) #Nonischemic dilated cardiomyopathy s/p CRT-D change out 09/2020 #elevated troponin 2/2 demand ischemia vs NSTEMI The patient represents 4 days after discharge with reported worsening shortness of breath, lower extremity edema, weakness and increased somnolence since she was discharged home. History is limited from her brother at beside who does not live with her. The patient is very somnolent during interview and only arouses slightly to painful stimuli and is hypotensive in the high 80s now on max dose of levophed during interview this afternoon.  Her BNP is markedly elevated to >4500, troponin delta from 144 to 1300, procal elevated to 2.2 and Ddimer 3.04. Elevated troponin multifactorial from hypotension and hypoxia but NSTEMI can't be excluded.  -continue BP support with levophed and midodrine. Consider addition of dobutamine for additional inotropic support per Dr. Clayborn Bigness.  -continue to trend troponin until peak, further workup pending.  -s/p IV lasix 24m x2. She will need further diuresis as her BP improves -off home GDMT of coreg and torsemide and spironolactone d/t hypotension and critical illness as above.   #elevated D dimer -3.04 on admission. CTA unable to be obtained d/t renal  function.  -Agree with bilateral LE ultrasound as per primary team.   #Acute hypoxic respiratory failure #COPD on 3 L at baseline Initially required BiPAP on admission and is now on 15L HFNC during interview.  ? Infectious source contributing. Procal 2.2.   #CKD 4 Renal function currently at baseline. Cr 2.24 and GFR 23.   #Paroxysmal atrial fibrillation not on anticoagulation due to history of GI bleed and multiple falls at home Currently V paced with rates in the 70s-80s during interview. EKG without acute changes.  Holding amiodarone, BB, and diuretics as above.   This patient's plan of care was discussed and created with Dr. DLujean Ameland he is in agreement.  Signed: LTristan Schroeder, PA-C 02/24/2021, 3:05 PM [facility]

## 2021-02-24 NOTE — Consult Note (Signed)
NAME:  Maria Neal, MRN:  [id number], DOB:  10-02-1950, LOS: 1 ADMISSION DATE:  02/14/2021, CONSULTATION DATE: 02/24/2021 REFERRING MD: Dr. Tawanna Solo, CHIEF COMPLAINT: Hypotension    History of Present Illness:  This is a 71 yo female who presented to [facility] ER on 02/22 from home via EMS with a 1-2 day hx of worsening shortness of breath and hypoxia with O2 sats in the 80's (wears 3L O2 at baseline).  She was recently hospitalized from 02/14~02/18 following treatment of acute on chronic combined CHF exacerbation pt discharged with increased dose of torsemide 40 mg daily.    ED course Upon arrival to the ER lab results revealed chloride 91, glucose 100, BUN 67, creatinine 2.42, BNP >4,500, troponin 131, hgb 9.5, platelets 121, PT 17.8/INR 1.5, and UA with trace leukocytes and rare bacteria.  CXR revealed stable cardiomegaly and vascular engorgement without overt edema.  COVID-19/Influenza PCR A&B negative.  Per ER notes pt noted to have rales and 3+ bilateral lower extremity pitting edema.  Pt also hypoxic initially requiring NRB, then transitioned to Bipap.  She also received 60 mg iv lasix x2 doses and sublingual nitroglycerin x1 dose.  Pt admitted to the stepdown unit per hospitalist team, but remained in the ER pending bed availability.    PCCM team consulted 02/23 due to pt developing hypotension map in the 50's requiring levophed gtt.  Pts status changed to ICU.   Pertinent  Medical History  Acute Appendicitis  AICD Anxiety  Atrial Fibrillation  CAD  Cardiomyopathy  COPD on chronic O2 @3L   Chronic Diastolic Systolic CHF~EF <95% via Echo 02/15/2021 Headache Kidney Stones  HTN Influenza A  Restless Leg Syndrome   Significant Hospital Events: Including procedures, antibiotic start and stop dates in addition to other pertinent events   02/22: Pt admitted to the stepdown unit with acute on chronic respiratory failure secondary to CHF exacerbation requiring intermittent Bipap remained in  the ER pending bed availability  02/23: PCCM team consulted pt developed hypotension requiring levophed gtt and transfer to ICU   Interim History / Subjective:  Pt currently lethargic and confused but able to follow commands.  She is currently requiring 15L HFNC with O2 sats upper 90's.  Objective   Blood pressure (!) 78/42, pulse 70, temperature 97.7 F (36.5 C), temperature source Oral, resp. rate 16, height 5\' 4"  (1.626 m), weight 81 kg, SpO2 100 %.    FiO2 (%):  [45 %] 45 %  No intake or output data in the 24 hours ending 02/24/21 1501 Filed Weights   02/11/2021 2158  Weight: 81 kg    Examination: General: acute on chronically ill appearing female asleep in bed, NAD on 15L HFNC  HENT: supple, kyphosis  Lungs: diminished throughout, even, non labored  Cardiovascular: ventricular paced rhythm, no R/G, 2+ radial/1+ distal pulses present, 1+ bilateral lower extremity edema  Abdomen: + BS x4, obese, soft, non tender, non distended  Extremities: normal bulk, moves all extremities  Neuro: lethargic, follows commands, disoriented to situation/time, PERRL Skin: left wrist erythematous and warm concerning for cellulitis, scattered ecchymosis   GU: voiding via [facility] Problem list   N/A  Assessment & Plan:  Acute on chronic hypoxic respiratory failure secondary to acute CHF exacerbation also concerning for possible PE  Hx: COPD and Chronic Home O2 @3L   -Supplemental O2 or prn Bipap for dyspnea and/or hypoxia -Maintain O2 sats 88% to 92% -Continue scheduled and prn bronchodilator therapy  -Unable to obtain CTA  Chest due to CKD; will check bilateral venous US to assess for VTE if positive due to inability to anticoagulate due to GI bleed hx pt will need an IVC filter   Acute on chronic diastolic systolic CHF with severe mitral valve regurgitation ~EF <20% via Echo 02/15/2021 Nonischemic dilated cardiomyopathy  Hypotension likely secondary to diuretic therapy and  beta blocker although possible sepsis due to LUE cellulitis  Elevated troponin secondary to demand ischemia vs. NSTEMI  Hx: AICD, Paroxysmal Atrial Fibrillation (unable to anticoagulate due to GI bleed secondary to eliquis), Chronic Hypotension   -Continuous telemetry monitoring  -Trend troponin's -Continue scheduled midodrine and prn levophed gtt to maintain map 60 or higher -Hold diuretic and beta blocker therapy for now due to hypotension; continue outpatient amiodarone  CKD stage IV Hypomagnesia and hypokalemia  -Trend BMP -Replace electrolytes as indicated  -Monitor UOP -Avoid nephrotoxic medications   LUE cellulitis -Trend WBC and monitor fever curve  -Trend PCT  -Follow cultures -Will start 7 day course of ceftriaxone~stop date 03/03/2021 -LUE Venous US pending to r/o VTE   Chronic normocytic anemia  Hx: GI bleed  -Trend CBC  -Monitor for s/sx of bleeding and transfuse for hgb <7 -Avoid chemical VTE px   Hypothyroidism -Continue synthroid  GERD  -Continue PPI   Best Practice (right click and "Reselect all SmartList Selections" daily)   Diet/type: Regular consistency (see orders) DVT prophylaxis: SCD GI prophylaxis: PPI Lines: N/A Foley:  N/A Code Status:  full code Last date of multidisciplinary goals of care discussion [N/A]  Updated pts daughter Daphna Lafuente via telephone regarding current plan of care and all questions answered.  Pts daughter requesting SNF placement for pt at discharge will consult care management  Labs   CBC: Recent Labs  Lab 02/18/21 0534 02/19/21 0531 02/19/2021 2202 02/24/21 0527  WBC 4.8 4.3 8.7 9.1  NEUTROABS  --   --   --  7.8*  HGB 9.3* 9.5* 9.5* 8.8*  HCT 32.7* 33.3* 33.5* 30.6*  MCV 89.8 87.9 87.9 88.2  PLT 152 146* 121* 94*    Basic Metabolic Panel: Recent Labs  Lab 02/18/21 0534 02/19/21 0531 02/13/2021 2202 02/24/21 0527  NA 136 139 136 139  K 4.0 4.1 4.1 3.4*  CL 94* 93* 91* 98  CO2 33* 34* 31 30  GLUCOSE 88  94 100* 103*  BUN 67* 71* 67* 63*  CREATININE 2.21* 2.25* 2.42* 2.24*  CALCIUM 9.2 9.7 8.9 8.0*  MG 2.0 2.0 1.8 1.6*  PHOS  --   --   --  3.0   GFR: Estimated Creatinine Clearance: 24.1 mL/min (A) (by C-G formula based on SCr of 2.24 mg/dL (H)). Recent Labs  Lab 02/18/21 0534 02/19/21 0531 02/09/2021 2202 02/24/21 0527  PROCALCITON  --   --   --  2.20  WBC 4.8 4.3 8.7 9.1    Liver Function Tests: Recent Labs  Lab 02/24/21 0527  AST 23  ALT 21  ALKPHOS 67  BILITOT 2.2*  PROT 5.3*  ALBUMIN 2.4*   No results for input(s): LIPASE, AMYLASE in the last 168 hours. No results for input(s): AMMONIA in the last 168 hours.  ABG    Component Value Date/Time   PHART 7.45 11/04/2020 1002   PCO2ART 56 (H) 11/04/2020 1002   PO2ART 84 11/04/2020 1002   HCO3 38.5 (H) 02/24/2021 0527   TCO2 29 08/07/2020 1302   O2SAT 44.7 02/24/2021 0527     Coagulation Profile: Recent Labs  Lab 02/22/2021 2202  INR 1.5*    Cardiac Enzymes: No results for input(s): CKTOTAL, CKMB, CKMBINDEX, TROPONINI in the last 168 hours.  HbA1C: Hgb A1c MFr Bld  Date/Time Value Ref Range Status  09/23/2020 11:07 AM 5.8 (H) 4.8 - 5.6 % Final    Comment:             Prediabetes: 5.7 - 6.4          Diabetes: >6.4          Glycemic control for adults with diabetes: <7.0   03/10/2020 03:36 PM 5.6 4.8 - 5.6 % Final    Comment:             Prediabetes: 5.7 - 6.4          Diabetes: >6.4          Glycemic control for adults with diabetes: <7.0     CBG: No results for input(s): GLUCAP in the last 168 hours.  Review of Systems: Positives in BOLD   Gen: Denies fever, chills, weight change, fatigue, night sweats HEENT: Denies blurred vision, double vision, hearing loss, tinnitus, sinus congestion, rhinorrhea, sore throat, neck stiffness, dysphagia PULM: shortness of breath, cough, sputum production, hemoptysis, wheezing CV: Denies chest pain, edema, orthopnea, paroxysmal nocturnal dyspnea,  palpitations GI: Denies abdominal pain, nausea, vomiting, diarrhea, hematochezia, melena, constipation, change in bowel habits GU: Denies dysuria, hematuria, polyuria, oliguria, urethral discharge Endocrine: Denies hot or cold intolerance, polyuria, polyphagia or appetite change Derm: Denies rash, dry skin, scaling or peeling skin change Heme: Denies easy bruising, bleeding, bleeding gums Neuro: Denies headache, numbness, weakness, slurred speech, loss of memory or consciousness  Past Medical History:  She,  has a past medical history of Acute appendicitis (02/17/2018), AICD (automatic cardioverter/defibrillator) present, Anxiety state, Arrhythmia, CAD (coronary artery disease), Cardiomyopathy ([place]), CHF (congestive heart failure) ([place]), COPD (chronic obstructive pulmonary disease) ([place]), Dyspnea, Headache, History of kidney stones, History of shingles, Hypertension, Influenza A (11/04/2020), On home oxygen therapy, and Restless leg syndrome.   Surgical History:   Past Surgical History:  Procedure Laterality Date   CARDIAC CATHETERIZATION  04/2014   Dr. Idelle Leech   CARDIAC CATHETERIZATION     CARDIAC DEFIBRILLATOR PLACEMENT     CARDIOVERSION N/A 08/11/2020   Procedure: CARDIOVERSION;  Surgeon: Dixie Dials, MD;  Location: [facility] ENDOSCOPY;  Service: Cardiovascular;  Laterality: N/A;   CHOLECYSTECTOMY N/A 08/23/2016   Procedure: LAPAROSCOPIC CHOLECYSTECTOMY;  Surgeon: Clayburn Pert, MD;  Location: [facility] ORS;  Service: General;  Laterality: N/A;   COLONOSCOPY WITH PROPOFOL N/A 12/15/2020   Procedure: COLONOSCOPY WITH PROPOFOL;  Surgeon: Lin Landsman, MD;  Location: [facility] ENDOSCOPY;  Service: Gastroenterology;  Laterality: N/A;   COLONOSCOPY WITH PROPOFOL N/A 12/16/2020   Procedure: COLONOSCOPY WITH PROPOFOL;  Surgeon: Lucilla Lame, MD;  Location: [facility] ENDOSCOPY;  Service: Endoscopy;  Laterality: N/A;   DILATATION & CURETTAGE/HYSTEROSCOPY WITH MYOSURE N/A 06/27/2018   Procedure:  DILATATION & CURETTAGE/HYSTEROSCOPY WITH MYOSURE;  Surgeon: Malachy Mood, MD;  Location: [facility] ORS;  Service: Gynecology;  Laterality: N/A;   ESOPHAGOGASTRODUODENOSCOPY N/A 12/15/2020   Procedure: ESOPHAGOGASTRODUODENOSCOPY (EGD);  Surgeon: Lin Landsman, MD;  Location: [facility] ENDOSCOPY;  Service: Gastroenterology;  Laterality: N/A;   HERNIA REPAIR     LAPAROSCOPIC APPENDECTOMY N/A 02/17/2018   Procedure: APPENDECTOMY LAPAROSCOPIC;  Surgeon: Benjamine Sprague, DO;  Location: [facility] ORS;  Service: General;  Laterality: N/A;   PPM GENERATOR CHANGEOUT N/A 09/14/2020   Procedure: PPM GENERATOR CHANGEOUT;  Surgeon: Isaias Cowman, MD;  Location: [facility];  Service:  Cardiovascular;  Laterality: N/A;   TEE WITHOUT CARDIOVERSION N/A 08/10/2020   Procedure: TRANSESOPHAGEAL ECHOCARDIOGRAM (TEE);  Surgeon: Dixie Dials, MD;  Location: [facility] ENDOSCOPY;  Service: Cardiovascular;  Laterality: N/A;     Social History:   reports that she has quit smoking. Her smoking use included cigarettes. She smoked an average of .25 packs per day. She has never used smokeless tobacco. She reports that she does not drink alcohol and does not use drugs.   Family History:  Her family history includes Arthritis in her sister and sister; Depression in her daughter and son; Diabetes in her brother; Diabetes Mellitus II in her mother; Heart attack in her father; Heart disease in her brother; Hypertension in her mother; Schizophrenia in her son; Thyroid disease in her brother. There is no history of Breast cancer.   Allergies Allergies  Allergen Reactions   Levaquin [Levofloxacin] Anaphylaxis   Amoxicillin Hives    Did it involve swelling of the face/tongue/throat, SOB, or low BP? No Did it involve sudden or severe rash/hives, skin peeling, or any reaction on the inside of your mouth or nose? No Did you need to seek medical attention at a hospital or doctor's office? No When did it last happen?      10+  years If all above answers are "NO", may proceed with cephalosporin use.   Nitrofuran Derivatives Other (See Comments)    Unknown   Penicillins Other (See Comments)    Thinks it made her itch a lot, but isn't sure. Did it involve swelling of the face/tongue/throat, SOB, or low BP? No Did it involve sudden or severe rash/hives, skin peeling, or any reaction on the inside of your mouth or nose? No Did you need to seek medical attention at a hospital or doctor's office? No When did it last happen?      10+ years If all above answers are "NO", may proceed with cephalosporin use.    Zithromax [Azithromycin] Other (See Comments)   Antihistamines, Chlorpheniramine-Type Rash     Home Medications  Prior to Admission medications   Medication Sig Start Date End Date Taking? Authorizing Provider  amiodarone (PACERONE) 200 MG tablet Take 0.5 tablets (100 mg total) by mouth daily. Home med. 02/19/21  Yes Enzo Bi, MD  carvedilol (COREG) 6.25 MG tablet Take 0.5 tablets (3.125 mg total) by mouth 2 (two) times daily. Reduced from 6.25 mg twice daily. 02/19/21  Yes Enzo Bi, MD  fluticasone (FLONASE) 50 MCG/ACT nasal spray Place 2 sprays into both nostrils daily. 07/26/18  Yes Volney American, PA-C  Fluticasone-Umeclidin-Vilant (TRELEGY ELLIPTA) 100-62.5-25 MCG/INH AEPB Inhale 1 puff into the lungs daily. 02/12/20  Yes [provider]  sucralfate (CARAFATE) 1 g tablet Take 1 tablet (1 g total) by mouth 4 (four) times daily. 02/05/2021  Yes Jon Billings, NP  torsemide (DEMADEX) 20 MG tablet Take 2 tablets (40 mg total) by mouth daily. Increased from 20 mg daily. 02/19/21 05/20/21 Yes Enzo Bi, MD  albuterol (VENTOLIN HFA) 108 (90 Base) MCG/ACT inhaler Inhale 2 puffs into the lungs every 6 (six) hours as needed for wheezing or shortness of breath. 01/18/21   Jon Billings, NP  calcitRIOL (ROCALTROL) 0.25 MCG capsule Take 0.25 mcg by mouth daily. 11/12/20   [provider]   gabapentin (NEURONTIN) 100 MG capsule Take 1 capsule (100 mg total) by mouth at bedtime. 08/13/20   Dixie Dials, MD  iron polysaccharides (NIFEREX) 150 MG capsule Take 1 capsule (150 mg total) by mouth daily. 12/16/20  Fritzi Mandes, MD  levothyroxine (SYNTHROID) 175 MCG tablet Take 1 tablet (175 mcg total) by mouth daily. 12/10/20   Renato Shin, MD  midodrine (PROAMATINE) 10 MG tablet Take 1 tablet (10 mg total) by mouth 3 (three) times daily with meals. This is an increase from 5 mg. 02/19/21 05/20/21  Enzo Bi, MD  OXYGEN Inhale 3 L into the lungs 3 (three) times daily as needed (shortness of breath or coughing).    [provider]  pantoprazole (PROTONIX) 40 MG tablet Take 1 tablet (40 mg total) by mouth daily. 01/18/21 01/18/22  Jon Billings, NP  spironolactone (ALDACTONE) 25 MG tablet Take 12.5 mg by mouth daily. 04/09/17   [provider]  vitamin B-12 1000 MCG tablet Take 1 tablet (1,000 mcg total) by mouth daily. 12/16/20   Fritzi Mandes, MD     Critical care time: 29 minutes       Donell Beers, [place] Pager [phone] (please enter 7 digits) PCCM Consult Pager [phone] (please enter 7 digits)

## 2021-02-24 NOTE — ED Notes (Signed)
Redness noted to this pt's L wrist below the line of the IV. Per Receiving RN, redness was present on this pt's presentation to ED. Pt unaware of what it is from.

## 2021-02-24 NOTE — Assessment & Plan Note (Signed)
Supplement with potassium.  Monitor level

## 2021-02-24 NOTE — ED Notes (Signed)
RT called at this time at the request of MD Adhikari to request an attempt to ween pt off of bipap. RT stated they would be here when they can, currently with a critical pt.

## 2021-02-24 NOTE — ED Notes (Signed)
Pt changed, fresh linen applied to bed, fresh purewick in place, and pt repositioned by this RN with the help of Night Shift RN at this time.

## 2021-02-24 NOTE — ED Notes (Signed)
Levo switched to new IV in R Forearm at this time

## 2021-02-24 NOTE — Progress Notes (Addendum)
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  She was recently hospitalized here for the management of acute on chronic systolic/diastolic onset heart failure.  Takes torsemide at home.  Complained of progressive shortness of breath associated with orthopnea, worsening edema in the bilateral lower extremities, requiring more oxygen than her usual.  On presentation she was hypoxic, requiring nonrebreather and was ultimately placed on BiPAP.  Lab work showed mild AKI on CKD, elevated BNP.  Chest x-ray showed cardiomegaly with pulmonary venous congestion without evidence of infiltrate.  Patient was admitted for acute on chronic combined systolic/diastolic congestive heart failure  Assessment & Plan:  Principal Problem:   Acute on chronic systolic (congestive) heart failure (HCC) Active Problems:   Hypomagnesemia   Hypokalemia   COPD, severe (HCC)   Acquired hypothyroidism   Acute on chronic respiratory failure with hypoxia (HCC) - home O2 @ 3 L/min   Acute renal failure superimposed on stage 3b chronic kidney disease (HCC)   Acute on chronic hypoxic/hypercarbic respiratory failure: On 3 L of oxygen chronically.  Had to put on nonrebreather on admission and was transitioned to BiPAP.  Monitor respiratory status.  Respiratory failure is secondary to CHF exacerbation.  Acute on chronic combined systolic/diastolic congestive heart failure: Last echo on February this year showed EF less than 31%, grade 2 diastolic dysfunction.  She has history of nonischemic dilated cardiomyopathy. Presented with  bilateral lower extremity swelling, shortness of breath, increased requirement of oxygen.  She was just admitted here and was discharged about a week ago after she was managed for the same.  Cardiology was following her during that hospitalization. On presentation, chest x-ray showed pulmonary vascular congestion, elevated troponin.  On her last admission, she was discharged on 40 mg of daily torsemide. Started on IV Lasix here.  Also on Coreg, spironolactone.  Continue to monitor strict input and output, daily weight, salt and fluid restriction.  History of paroxysmal A-fib: Status post cardioversion on 8/22.  Not on anticoagulation due to history of GI bleed.  Currently rate is controlled.  Currently V paced.  Takes amiodarone 100 mg daily at home.  On carvedilol for rate control  COPD: On 3 L of oxygen at baseline.  CKD stage IV: Baseline creatinine around 2.17.  Currently kidney function at baseline.  Chronic hypotension: On midodrine 10 mg 3 times a day  History of peripheral neuropathy: on gabapentin  History of chronic normocytic anemia: Currently hemoglobin stable  History of GERD: On Protonix, Carafate  History of hypothyroidism: On Synthyroid  Hypomagnesemia/hypokalemia: Being supplemented and monitored   Addendum:  Patient became hypotensive later this morning.  ICU consulted.  She has been transferred to ICU service.         DVT prophylaxis:SCDs Start: 02/21/2021 2336     Code Status: Full Code  Family Communication: Called and discussed with daughter on phone on 2/23 she ate  Patient status:Inpatient  Patient is from :Home  Anticipated discharge VQ:MGQQ  Estimated DC date:In 2-3 days    Consultants: None  Procedures:None  Antimicrobials:  Anti-infectives (From admission, onward)    None       Subjective:  Patient seen and examined at the bedside this morning.  Hemodynamically stable.  On BiPAP during my evaluation.  Does not look  in severe  respiratory distress.  Alert and oriented.   Objective: Vitals:   02/24/21 1000 02/24/21 1030 02/24/21 1100 02/24/21 1130  BP:  (!) 93/57 110/63 109/71  Pulse: 70 72 70 70  Resp: (!) 26 (!) 31 (!) 32 (!) 22  Temp:      TempSrc:      SpO2: 94% 95% 95% 93%  Weight:      Height:       No intake or output data in the 24 hours ending 02/24/21 1156 Filed Weights   02/11/2021 2158  Weight: 81 kg    Examination:  General exam: Very deconditioned, chronically ill looking HEENT: On BiPAP  respiratory system: Diminished sounds bilaterally, crackles cardiovascular system: Paced rhythm Gastrointestinal system: Abdomen is nondistended, soft and nontender. Central nervous system: Alert and oriented Extremities: Trace bilateral lower extremity pitting edema, no clubbing ,no cyanosis Skin: No rashes, no ulcers,no icterus     Data Reviewed: I have personally reviewed following labs and imaging studies  CBC: Recent Labs  Lab 02/18/21 0534 02/19/21 0531 02/14/2021 2202 02/24/21 0527  WBC 4.8 4.3 8.7 9.1  NEUTROABS  --   --   --  7.8*  HGB 9.3* 9.5* 9.5* 8.8*  HCT 32.7* 33.3* 33.5* 30.6*  MCV 89.8 87.9 87.9 88.2  PLT 152 146* 121* 94*   Basic Metabolic Panel: Recent Labs  Lab 02/18/21 0534 02/19/21 0531 02/21/2021 2202 02/24/21 0527  NA 136 139 136 139  K 4.0 4.1 4.1 3.4*  CL 94* 93* 91* 98  CO2 33* 34* 31 30  GLUCOSE 88 94 100* 103*  BUN 67* 71* 67* 63*  CREATININE 2.21* 2.25* 2.42* 2.24*  CALCIUM 9.2 9.7 8.9 8.0*  MG 2.0 2.0 1.8 1.6*  PHOS  --   --   --  3.0     Recent Results (from the past 240 hour(s))  Resp Panel by RT-PCR (Flu A&B, Covid) Nasopharyngeal Swab     Status: None   Collection Time: 02/15/21 10:56 AM   Specimen: Nasopharyngeal Swab; Nasopharyngeal(NP) swabs in vial transport medium  Result Value Ref Range Status   SARS Coronavirus 2 by RT PCR NEGATIVE NEGATIVE Final    Comment: (NOTE) SARS-CoV-2 target nucleic acids are NOT DETECTED.  The SARS-CoV-2  RNA is generally detectable in upper respiratory specimens during the acute phase of infection. The lowest concentration of SARS-CoV-2 viral copies this assay can detect is 138 copies/mL. A negative result does not preclude SARS-Cov-2 infection and should not be used as the sole basis for treatment or other patient management decisions. A negative result may occur with  improper specimen collection/handling, submission of specimen other than nasopharyngeal swab, presence of viral mutation(s) within the areas targeted by this assay, and inadequate number of viral copies(<138 copies/mL). A negative result must be combined with clinical observations, patient history, and epidemiological information. The expected result is Negative.  Fact Sheet for Patients:  EntrepreneurPulse.com.au  Fact Sheet for Healthcare Providers:  IncredibleEmployment.be  This test is no t yet approved or cleared by the [place] FDA and  has been authorized for detection and/or diagnosis of SARS-CoV-2 by FDA under an Emergency Use Authorization (EUA). This EUA will remain  in effect (meaning this test can be used) for the duration of the COVID-19 declaration under Section 564(b)(1) of the Act, 21 U.S.C.section 360bbb-3(b)(1), unless the authorization is  terminated  or revoked sooner.       Influenza A by PCR NEGATIVE NEGATIVE Final   Influenza B by PCR NEGATIVE NEGATIVE Final    Comment: (NOTE) The Xpert Xpress SARS-CoV-2/FLU/RSV plus assay is intended as an aid in the diagnosis of influenza from Nasopharyngeal swab specimens and should not be used as a sole basis for treatment. Nasal washings and aspirates are unacceptable for Xpert Xpress SARS-CoV-2/FLU/RSV testing.  Fact Sheet for Patients: EntrepreneurPulse.com.au  Fact Sheet for Healthcare Providers: IncredibleEmployment.be  This test is not yet approved or cleared by the  [place] FDA and has been authorized for detection and/or diagnosis of SARS-CoV-2 by FDA under an Emergency Use Authorization (EUA). This EUA will remain in effect (meaning this test can be used) for the duration of the COVID-19 declaration under Section 564(b)(1) of the Act, 21 U.S.C. section 360bbb-3(b)(1), unless the authorization is terminated or revoked.  Performed at [facility], [address] [place], [place] [address]   MRSA Next Gen by PCR, Nasal     Status: None   Collection Time: 02/16/21  7:00 AM   Specimen: Nasal Mucosa; Nasal Swab  Result Value Ref Range Status   MRSA by PCR Next Gen NOT DETECTED NOT DETECTED Final    Comment: (NOTE) The GeneXpert MRSA Assay (FDA approved for NASAL specimens only), is one component of a comprehensive MRSA colonization surveillance program. It is not intended to diagnose MRSA infection nor to guide or monitor treatment for MRSA infections. Test performance is not FDA approved in patients less than 21 years old. Performed at [facility], [address] [place], [place] [address]   Resp Panel by RT-PCR (Flu A&B, Covid) Nasopharyngeal Swab     Status: None   Collection Time: 02/26/2021 10:02 PM   Specimen: Nasopharyngeal Swab; Nasopharyngeal(NP) swabs in vial transport medium  Result Value Ref Range Status   SARS Coronavirus 2 by RT PCR NEGATIVE NEGATIVE Final    Comment: (NOTE) SARS-CoV-2 target nucleic acids are NOT DETECTED.  The SARS-CoV-2 RNA is generally detectable in upper respiratory specimens during the acute phase of infection. The lowest concentration of SARS-CoV-2 viral copies this assay can detect is 138 copies/mL. A negative result does not preclude SARS-Cov-2 infection and should not be used as the sole basis for treatment or other patient management decisions. A negative result may occur with  improper specimen collection/handling, submission of specimen other than nasopharyngeal swab,  presence of viral mutation(s) within the areas targeted by this assay, and inadequate number of viral copies(<138 copies/mL). A negative result must be combined with clinical observations, patient history, and epidemiological information. The expected result is Negative.  Fact Sheet for Patients:  EntrepreneurPulse.com.au  Fact Sheet for Healthcare Providers:  IncredibleEmployment.be  This test is no t yet approved or cleared by the [place] FDA and  has been authorized for detection and/or diagnosis of SARS-CoV-2 by FDA under an Emergency Use Authorization (EUA). This EUA will remain  in effect (meaning this test can be used) for the duration of the COVID-19 declaration under Section 564(b)(1) of the Act, 21 U.S.C.section 360bbb-3(b)(1), unless the authorization is terminated  or revoked sooner.       Influenza A by PCR NEGATIVE NEGATIVE Final   Influenza B by PCR NEGATIVE NEGATIVE Final    Comment: (NOTE) The Xpert Xpress SARS-CoV-2/FLU/RSV plus assay is intended as an aid in the diagnosis of influenza from Nasopharyngeal swab specimens and should not be used as a sole basis  for treatment. Nasal washings and aspirates are unacceptable for Xpert Xpress SARS-CoV-2/FLU/RSV testing.  Fact Sheet for Patients: EntrepreneurPulse.com.au  Fact Sheet for Healthcare Providers: IncredibleEmployment.be  This test is not yet approved or cleared by the [place] FDA and has been authorized for detection and/or diagnosis of SARS-CoV-2 by FDA under an Emergency Use Authorization (EUA). This EUA will remain in effect (meaning this test can be used) for the duration of the COVID-19 declaration under Section 564(b)(1) of the Act, 21 U.S.C. section 360bbb-3(b)(1), unless the authorization is terminated or revoked.  Performed at [facility], [address], [place], [place] [address]      Radiology  Studies: DG Chest Portable 1 View  Result Date: 02/26/2021 CLINICAL DATA:  Shortness of breath, dyspnea, hypoxia. EXAM: PORTABLE CHEST 1 VIEW COMPARISON:  Portable chest 02/15/2021. FINDINGS: Severe cardiomegaly is again noted with perihilar vascular engorgement and flow cephalization without overt edema findings or pleural collections. The lungs are generally clear, but the lower lung fields are suboptimally evaluated due to the enlarged heart superimposing. Stable mediastinum with mild aortic tortuosity with calcification in the aortic arch. Again noted is a left chest pacing system with AID wiring, unchanged. Thoracic spondylosis. IMPRESSION: Stable cardiomegaly and vascular engorgement without overt edema. Lungs are clear of focal consolidation with the lower lung fields suboptimally assessed due to superimposition of the enlarged heart. Electronically Signed   By: Telford Nab M.D.   On: 02/11/2021 22:32    Scheduled Meds:  amiodarone  100 mg Oral Daily   carvedilol  3.125 mg Oral BID   [START ON 02/25/2021] fluticasone furoate-vilanterol  1 puff Inhalation Daily   And   [START ON 02/25/2021] umeclidinium bromide  1 puff Inhalation Daily   furosemide  60 mg Intravenous BID   iron polysaccharides  150 mg Oral Daily   levothyroxine  175 mcg Oral Q0600   pantoprazole  40 mg Oral Daily   Continuous Infusions:   LOS: 1 day   Shelly Coss, MD [facility] P2/23/2023, 11:56 AM

## 2021-02-24 NOTE — ED Notes (Signed)
MD Fransico Setters at this time about pt BP being low still.

## 2021-02-24 NOTE — ED Notes (Signed)
MD Fransico Setters regarding pt BP at this time

## 2021-02-24 NOTE — H&P (Signed)
History and Physical    PLEASE NOTE THAT DRAGON DICTATION SOFTWARE WAS USED IN THE CONSTRUCTION OF THIS NOTE.   Maria Neal JKK:[id number] DOB: 02-24-1950 DOA: 02/09/2021  PCP: Pcp, No (will further assess) Patient coming from: home   I have personally briefly reviewed patient's old medical records in [place]  Chief Complaint: sob  HPI: Maria Neal is a 71 y.o. female with medical history significant for chronic systolic/diastolic heart failure, chronic hypoxic respiratory failure on continuous 3 L nasal cannula, chronic anemia with baseline hemoglobin 9-10, paroxysmal atrial fibrillation, COPD, stage IIIb chronic kidney disease with baseline creatinine 2.0-2.2, chronically elevated troponin, who is admitted to [facility] on 02/17/2021 with acute on chronic systolic/diastolic heart failure after presenting from home to [facility] ED complaining of shortness of breath.   [facility]18[facility] 23 for acute on chronic systolic/diastolic heart failure, at which time she was discharged to home in increased dose of torsemide.   However, over the last 1 to 2 days, she reports progressive shortness of breath associated with orthopnea, worsening edema in the bilateral lower extremities.  In that setting, she contacted EMS, who noted her initial oxygen saturation to be in the low 80s on her baseline 3 L continuous nasal cannula, prompting her to be placed on 15 L nonrebreather at which time she was brought to [facility] ED for further evaluation and management thereof.  She denies any associated recent subjective fever, chills, rigors, or generalized myalgias.  Not associate with any recent chest pain, diaphoresis, palpitations, nausea, vomiting, presyncope, or syncope.  Denies any new onset cough, hemoptysis, new lower extremity erythema, or calf tenderness.  Per chart review, most recent echocardiogram occurred on 02/15/2021 and was notable for LVEF less than 20% with  global left ventricular hypokinesis, severely dilated left ventricular cavity, and grade 2 diastolic dysfunction.  It appears that she was discharged from the hospital on torsemide 40 mg p.o. daily as well as spironolactone.  She is also on Coreg as an outpatient.  She is status post AICD placement.  Medical history also notable for stage IIIb chronic kidney disease with baseline creatinine 2.0-2.2.  Of note, high-sensitivity troponin during most recent hospitalization was found to be in the range of 99-130, with most recent prior value noted to be 130 on 02/15/2021.     ED Course:  Vital signs in the ED were notable for the following: Upon arrival in the ED, nonrebreather was changed to BiPAP, with settings of 10/5 with 45% FiO2, with ensuing O2 sats noted to be in the range of 98 to 100%; afebrile, heart rate 70-71; blood pressure initially 152/117, which decreased to 123/85 following initiation of BiPAP as well as 1 dose of sublingual nitroglycerin given for antihypertensive purposes; respiratory rate 18-22.  Labs were notable for the following: BMP was notable for the following: BUN 67, creatinine 2.42 compared to most recent prior value of 2.25 on 02/19/2021.  Magnesium 1.8.  BNP greater than 4500 compared to 3600 on 02/15/2021.  High-sensitivity troponin I 131 compared to 130 on 02/15/2021.  CBC notable for white blood cell count 8700, hemoglobin 9.5, unchanged from most recent prior value on 02/19/2021.  COVID-19/influenza PCR negative.  Imaging and additional notable ED work-up: EKG demonstrates ventricular paced rhythm with heart rate 70; chest x-ray shows cardiomegaly with pulmonary vascular congestion without overt evidence of infiltrate, pleural effusion, or pneumothorax.  While in the ED, the following were administered: Lasix 60 mg IV  x1 in addition to the aforementioned sublingual nitroglycerin x1 dose.  Subsequently, the patient was admitted for further evaluation management of acute on  chronic systolic/diastolic heart failure complicated by acute on chronic hypoxic respiratory failure as well as acute kidney injury superimposed on stage IIIb CKD.     Review of Systems: As per HPI otherwise 10 point review of systems negative.   Past Medical History:  Diagnosis Date   Acute appendicitis 02/17/2018   AICD (automatic cardioverter/defibrillator) present    Anxiety state    Arrhythmia    atrial fibrillation   CAD (coronary artery disease)    Cardiomyopathy (HCC)    CHF (congestive heart failure) (HCC)    COPD (chronic obstructive pulmonary disease) ([place])    Dyspnea    Headache    History of kidney stones    History of shingles    Hypertension    Influenza A 11/04/2020   On home oxygen therapy    bedtime and prn   Restless leg syndrome     Past Surgical History:  Procedure Laterality Date   CARDIAC CATHETERIZATION  04/2014   Dr. Idelle Leech   CARDIAC CATHETERIZATION     CARDIAC DEFIBRILLATOR PLACEMENT     CARDIOVERSION N/A 08/11/2020   Procedure: CARDIOVERSION;  Surgeon: Dixie Dials, MD;  Location: [place];  Service: Cardiovascular;  Laterality: N/A;   CHOLECYSTECTOMY N/A 08/23/2016   Procedure: LAPAROSCOPIC CHOLECYSTECTOMY;  Surgeon: Clayburn Pert, MD;  Location: [facility];  Service: General;  Laterality: N/A;   COLONOSCOPY WITH PROPOFOL N/A 12/15/2020   Procedure: COLONOSCOPY WITH PROPOFOL;  Surgeon: Lin Landsman, MD;  Location: [place];  Service: Gastroenterology;  Laterality: N/A;   COLONOSCOPY WITH PROPOFOL N/A 12/16/2020   Procedure: COLONOSCOPY WITH PROPOFOL;  Surgeon: Lucilla Lame, MD;  Location: [facility] ENDOSCOPY;  Service: Endoscopy;  Laterality: N/A;   DILATATION & CURETTAGE/HYSTEROSCOPY WITH MYOSURE N/A 06/27/2018   Procedure: DILATATION & CURETTAGE/HYSTEROSCOPY WITH MYOSURE;  Surgeon: Malachy Mood, MD;  Location: [facility];  Service: Gynecology;  Laterality: N/A;   ESOPHAGOGASTRODUODENOSCOPY N/A 12/15/2020   Procedure:  ESOPHAGOGASTRODUODENOSCOPY (EGD);  Surgeon: Lin Landsman, MD;  Location[facility]COPY;  Service: Gastroenterology;  Laterality: N/A;   HERNIA REPAIR     LAPAROSCOPIC APPENDECTOMY N/A 02/17/2018   Procedure: APPENDECTOMY LAPAROSCOPIC;  Surgeon: Benjamine Sprague, DO;  Location: [facility] OR[facility];  Service: General;  Laterality: N/A;   PPM GENERATOR CHANGEOUT N/A 09/14/2020   Procedure: PPM GENERATOR CHANGEOUT;  Surgeon: Isaias Cowman, MD;  Location: [place] CV LAB;  Service: Cardiovascular;  Laterality: N/A;   TEE WITHOUT CARDIOVERSION N/A 08/10/2020   Procedure: TRANSESOPHAGEAL ECHOCARDIOGRAM (TEE);  Surgeon: Dixie Dials, MD;  Location: [facility] ENDOSCOPY;  Service: Cardiovascular;  Laterality: N/A;    Social History:  reports that she has quit smoking. Her smoking use included cigarettes. She smoked an average of .25 packs per day. She has never used smokeless tobacco. She reports that she does not drink alcohol and does not use drugs.   Allergies  Allergen Reactions   Levaquin [Levofloxacin] Anaphylaxis   Amoxicillin Hives    Did it involve swelling of the face/tongue/throat, SOB, or low BP? No Did it involve sudden or severe rash/hives, skin peeling, or any reaction on the inside of your mouth or nose? No Did you need to seek medical attention at a hospital or doctor's office? No When did it last happen?      10+ years If all above answers are "NO", may proceed with cephalosporin use.   Nitrofuran Derivatives Other (  See Comments)    Unknown   Penicillins Other (See Comments)    Thinks it made her itch a lot, but isn't sure. Did it involve swelling of the face/tongue/throat, SOB, or low BP? No Did it involve sudden or severe rash/hives, skin peeling, or any reaction on the inside of your mouth or nose? No Did you need to seek medical attention at a hospital or doctor's office? No When did it last happen?      10+ years If all above answers are "NO", may proceed with cephalosporin  use.    Zithromax [Azithromycin] Other (See Comments)   Antihistamines, Chlorpheniramine-Type Rash    Family History  Problem Relation Age of Onset   Diabetes Mellitus II Mother    Hypertension Mother    Heart attack Father    Arthritis Sister    Arthritis Sister    Thyroid disease Brother    Heart disease Brother    Diabetes Brother    Depression Daughter    Depression Son    Schizophrenia Son    Breast cancer Neg Hx     Family history reviewed and not pertinent    Prior to Admission medications   Medication Sig Start Date End Date Taking? Authorizing Provider  albuterol (VENTOLIN HFA) 108 (90 Base) MCG/ACT inhaler Inhale 2 puffs into the lungs every 6 (six) hours as needed for wheezing or shortness of breath. 01/18/21   Jon Billings, NP  amiodarone (PACERONE) 200 MG tablet Take 0.5 tablets (100 mg total) by mouth daily. Home med. 02/19/21   Enzo Bi, MD  calcitRIOL (ROCALTROL) 0.25 MCG capsule Take 0.25 mcg by mouth daily. 11/12/20   [provider]  carvedilol (COREG) 6.25 MG tablet Take 0.5 tablets (3.125 mg total) by mouth 2 (two) times daily. Reduced from 6.25 mg twice daily. 02/19/21   Enzo Bi, MD  fluticasone (FLONASE) 50 MCG/ACT nasal spray Place 2 sprays into both nostrils daily. 07/26/18   Volney American, PA-C  Fluticasone-Umeclidin-Vilant (TRELEGY ELLIPTA) 100-62.5-25 MCG/INH AEPB Inhale 1 puff into the lungs daily. 02/12/20   [provider]  gabapentin (NEURONTIN) 100 MG capsule Take 1 capsule (100 mg total) by mouth at bedtime. 08/13/20   Dixie Dials, MD  iron polysaccharides (NIFEREX) 150 MG capsule Take 1 capsule (150 mg total) by mouth daily. 12/16/20   Fritzi Mandes, MD  levothyroxine (SYNTHROID) 175 MCG tablet Take 1 tablet (175 mcg total) by mouth daily. 12/10/20   Renato Shin, MD  midodrine (PROAMATINE) 10 MG tablet Take 1 tablet (10 mg total) by mouth 3 (three) times daily with meals. This is an increase from 5 mg. 02/19/21  05/20/21  Enzo Bi, MD  OXYGEN Inhale 3 L into the lungs 3 (three) times daily as needed (shortness of breath or coughing).    [provider]  pantoprazole (PROTONIX) 40 MG tablet Take 1 tablet (40 mg total) by mouth daily. 01/18/21 01/18/22  Jon Billings, NP  spironolactone (ALDACTONE) 25 MG tablet Take 12.5 mg by mouth daily. 04/09/17   [provider]  sucralfate (CARAFATE) 1 g tablet Take 1 tablet (1 g total) by mouth 4 (four) times daily. 02/04/2021   Jon Billings, NP  torsemide (DEMADEX) 20 MG tablet Take 2 tablets (40 mg total) by mouth daily. Increased from 20 mg daily. 02/19/21 05/20/21  Enzo Bi, MD  vitamin B-12 1000 MCG tablet Take 1 tablet (1,000 mcg total) by mouth daily. 12/16/20   Fritzi Mandes, MD     Objective  Physical Exam: Vitals:   02/09/2021 2300 02/12/2021 2330 02/24/21 0100 02/24/21 0230  BP: (!) 112/48 123/85 (!) 113/48 (!) 117/47  Pulse: 70 69 70 70  Resp: 18 (!) 28 (!) 26 15  Temp:      TempSrc:      SpO2: 100% 100% 99% 98%  Weight:      Height:        General: appears to be stated age; alert, oriented; mildly increased work of breathing noted, on BiPAP Skin: warm, dry, no rash Head:  AT/[place] Mouth:  Oral mucosa membranes appear moist, normal dentition Neck: supple; trachea midline Heart:  RRR; did not appreciate any M/R/G Lungs: CTAB, did not appreciate any wheezes, rales, or rhonchi Abdomen: + BS; soft, ND, NT Vascular: 2+ pedal pulses b/l; 2+ radial pulses b/l Extremities: 2+ edema in the bilateral lower extremities, no muscle wasting Neuro: strength and sensation intact in upper and lower extremities b/l    Labs on Admission: I have personally reviewed following labs and imaging studies  CBC: Recent Labs  Lab 02/17/21 0444 02/18/21 0534 02/19/21 0531 02/14/2021 2202  WBC 4.4 4.8 4.3 8.7  HGB 9.0* 9.3* 9.5* 9.5*  HCT 31.2* 32.7* 33.3* 33.5*  MCV 88.4 89.8 87.9 87.9  PLT 151 152 146* 431*   Basic Metabolic  Panel: Recent Labs  Lab 02/17/21 0444 02/18/21 0534 02/19/21 0531 02/14/2021 2202  NA 135 136 139 136  K 4.2 4.0 4.1 4.1  CL 94* 94* 93* 91*  CO2 31 33* 34* 31  GLUCOSE 95 88 94 100*  BUN 70* 67* 71* 67*  CREATININE 2.30* 2.21* 2.25* 2.42*  CALCIUM 9.0 9.2 9.7 8.9  MG 2.1 2.0 2.0 1.8   GFR: Estimated Creatinine Clearance: 22.3 mL/min (A) (by C-G formula based on SCr of 2.42 mg/dL (H)). Liver Function Tests: No results for input(s): AST, ALT, ALKPHOS, BILITOT, PROT, ALBUMIN in the last 168 hours. No results for input(s): LIPASE, AMYLASE in the last 168 hours. No results for input(s): AMMONIA in the last 168 hours. Coagulation Profile: Recent Labs  Lab 02/15/2021 2202  INR 1.5*   Cardiac Enzymes: No results for input(s): CKTOTAL, CKMB, CKMBINDEX, TROPONINI in the last 168 hours. BNP (last 3 results) No results for input(s): PROBNP in the last 8760 hours. HbA1C: No results for input(s): HGBA1C in the last 72 hours. CBG: No results for input(s): GLUCAP in the last 168 hours. Lipid Profile: No results for input(s): CHOL, HDL, LDLCALC, TRIG, CHOLHDL, LDLDIRECT in the last 72 hours. Thyroid Function Tests: No results for input(s): TSH, T4TOTAL, FREET4, T3FREE, THYROIDAB in the last 72 hours. Anemia Panel: No results for input(s): VITAMINB12, FOLATE, FERRITIN, TIBC, IRON, RETICCTPCT in the last 72 hours. Urine analysis:    Component Value Date/Time   COLORURINE YELLOW (A) 10/06/2018 1737   APPEARANCEUR Clear 11/11/2020 1024   LABSPEC 1.012 10/06/2018 1737   LABSPEC 1.021 09/21/2012 1516   PHURINE 6.0 10/06/2018 1737   GLUCOSEU Negative 11/11/2020 1024   GLUCOSEU Negative 09/21/2012 1516   HGBUR NEGATIVE 10/06/2018 1737   BILIRUBINUR Negative 11/11/2020 1024   BILIRUBINUR Negative 09/21/2012 1516   KETONESUR NEGATIVE 10/06/2018 1737   PROTEINUR Negative 11/11/2020 1024   PROTEINUR NEGATIVE 10/06/2018 1737   NITRITE Negative 11/11/2020 1024   NITRITE NEGATIVE 10/06/2018  1737   LEUKOCYTESUR 1+ (A) 11/11/2020 1024   LEUKOCYTESUR TRACE (A) 10/06/2018 1737   LEUKOCYTESUR Negative 09/21/2012 1516    Radiological Exams on Admission: DG Chest Portable 1 View  Result Date:  02/22/2021 CLINICAL DATA:  Shortness of breath, dyspnea, hypoxia. EXAM: PORTABLE CHEST 1 VIEW COMPARISON:  Portable chest 02/15/2021. FINDINGS: Severe cardiomegaly is again noted with perihilar vascular engorgement and flow cephalization without overt edema findings or pleural collections. The lungs are generally clear, but the lower lung fields are suboptimally evaluated due to the enlarged heart superimposing. Stable mediastinum with mild aortic tortuosity with calcification in the aortic arch. Again noted is a left chest pacing system with AID wiring, unchanged. Thoracic spondylosis. IMPRESSION: Stable cardiomegaly and vascular engorgement without overt edema. Lungs are clear of focal consolidation with the lower lung fields suboptimally assessed due to superimposition of the enlarged heart. Electronically Signed   By: Telford Nab M.D.   On: 02/18/2021 22:32     EKG: Independently reviewed, with result as described above.    Assessment/Plan    Principal Problem:   Acute on chronic systolic (congestive) heart failure (HCC) Active Problems:   COPD, severe (HCC)   Acquired hypothyroidism   Acute on chronic respiratory failure with hypoxia (HCC) - home O2 @ 3 L/min   Acute renal failure superimposed on stage 3b chronic kidney disease (HCC)      #) Acute on chronic systolic/diastolic heart failure: dx of acute decompensation on the basis of presenting 1 to 2 days of progressive shortness of breath associated with orthopnea and worsening edema in the bilateral lower extremities, with BNP now increased to greater than 4500 relative to most recent prior value of 3600, with presentation also notable for acute on chronic hypoxic respiratory failure, as further detailed below. This is in the  context of a known history of chronic systolic/diastolic heart failure, with most recent echocardiogram performed on 02/15/2021, noting LVEF less than 20% as well as grade 2 diastolic dysfunction, with additional details as described above.  This is in the context of recent hospitalization for similar, and occurred in spite of reported good compliance on increased dose of torsemide, as quantified above.  Patient's mildly elevated troponin appears nearly unchanged relative to most recent prior, specifically presenting value of 131 relative to most recent prior value of 130 on 02/15/2021.  This is in the context of chronic mildly elevated troponin, with slight increase relative to the baseline on the basis of type II supply demand mismatch as a consequence of underlying acutely decompensated heart failure as opposed to representing ACS causing presenting acute heart failure exacerbation, particularly in the absence of any recent CP, with likely additional contribution towards a type II process as a consequence of diminished oxygen delivery capacity due to corresponding acute on chronic hypoxic respiratory failure as a result of presenting acutely decompensated heart failure. However, will continue to evaluate with further trending of troponin and close monitoring on tele.  Her additional outpatient cardiac medications include Coreg, spironolactone.  Does not appear to be on an ACE inhibitor or an ARB in the setting of stage IIIb chronic kidney disease.  Of note, patient received Lasix 60 mg IV x1 while in the ED today. Presentation warrants additional IV diuresis, as further detailed below, with close monitoring of ensuing renal function, electrolytes, and volume status. As patient is already on a BB at home, will plan to continue this.  In terms of decision-making regarding dose of IV Lasix, her home torsemide 40 mg p.o. daily is equivalent to Lasix 80 mg IV daily.  Consequently, will order a effective increase in IV  Lasix dosing by pursuing Lasix 60 mg IV twice daily for now, we will closely  monitoring interval fluid status as well as renal response.    Plan: monitor strict I's & O's and daily weights. Monitor on telemetry, including trend in HR in response to diuresis, as above. Monitor continuous pulse oximetry. Repeat BMP in the morning, including for monitoring trend of potassium, bicarbonate, and renal function in response to interval diuresis efforts.  Repeat serum mag  in the AM. Close monitoring of ensuing blood pressure response to diuresis efforts, including to help guide need for improvement in afterload reduction in order to optimize cardiac output. Trend troponin. Lasix 60 mg IV twice daily, as above. Continue outpatient BB.  Continue BiPAP for now.  VBG in the morning.       #) Acute kidney injury superimposed on stage IIIb CKD: Relative to baseline creatinine of 2.0-2.2, presenting creatinine 2.42.  This appears to be prerenal in nature, suspected to be on the basis of diminished renal perfusion gradient as a consequence of acute on chronic systolic/diastolic heart failure.  We will pursue optimization of diuresis efforts, as further defined above, with close monitoring of ensuing trend in renal function and response to these measures.  Plan: Monitor strict I's and O's and weights.  Tempt avoid nephrotoxic agents.  Repeat BMP in the morning.  IV diuresis, as above.  Check urinalysis with microscopy.  Add on random urine sodium as well as random urine creatinine.  Hold home gabapentin.       #) Acute on chronic hypoxic respiratory failure: In the setting of chronic hypoxic failure in which the patient is on 3 L continuous nasal cannula at home, her initial O2 sats were reportedly in the low 80s on this level of baseline supplemental oxygen, prompting, initially, 15 L nonrebreather, before transition to BiPAP in the emergency department this evening, as further detailed above.  This appears to  the basis of acute on chronic systolic/diastolic heart failure, as above.  No clinical or radiographic evidence to suggest underlying pneumonia at this time, although we will check procalcitonin to further evaluate.  ACS felt to be less likely, as further detailed above.  Chest x-ray showed no evidence of pneumothorax.  Presentation is clinically less suggestive of acute pulmonary embolism.  Plan: Monitor case Pulsoxymeter.  Further evaluation management of acute on chronic systolic/diastolic heart failure, as above, including plan for IV diuresis as discussed above.  Add on serum magnesium level and check serum phosphorus level.  Repeat CMP and CBC in the morning.  Add on procalcitonin level.  Continue to trend troponin.  VBG.  Monitor on telemetry.         #) Chronic anemia: Associated baseline hemoglobin 9-10, with presenting hemoglobin consistent with his baseline range, in the absence of any overt evidence of active bleed.  Plan: Repeat CBC in the morning.         #) Paroxysmal atrial fibrillation: Documented history of such. In setting of CHA2DS2-VASc score of  5, there is an indication for chronic anticoagulation for thromboembolic prophylaxis, although the patient is not currently on chronic anticoagulation. Home AV nodal blocking regimen: Coreg.  Most recent echocardiogram was performed on 02/15/2021, as further detailed above.  Plan: monitor strict I's & O's and daily weights. Repeat BMP/CBC in AM. Check serum mag level. Continue home AV nodal blocking regimen.  Monitor on telemetry.       #) COPD: Documented history of such in the setting of being a former smoker: Outpatient respiratory regimen includes Trelegy as well as as prn albuterol inhaler.  Clinically,  presentation is suggestive of acute COPD exacerbation.  Will check VBG.   Plan: Continue outpatient Trelegy as well as as needed albuterol.  Monitor continuous pulse oximetry.  Check VBG, as above.  Check serum  magnesium and phosphorus levels.         #) acquired hypothyroidism: documented h/o such, on Synthroid as outpatient.   Plan: cont home Synthroid.           #) GERD: documented h/o such; on Protonix as outpatient.   Plan: continue home PPI.       DVT prophylaxis: SCD's   Code Status: Full code Family Communication: none Disposition Plan: Per Rounding Team Consults called: none;  Admission status: Inpatient    PLEASE NOTE THAT DRAGON DICTATION SOFTWARE WAS USED IN THE CONSTRUCTION OF THIS NOTE.   [place] DO [facility] [place]   02/24/2021, 3:04 AM

## 2021-02-24 NOTE — Telephone Encounter (Signed)
Spoke with daughter [name] and says the patient is still taking the medication, but she has not taking much of it since Monday. [name] says her mother is still in the hospital. Please advise?

## 2021-02-24 NOTE — ED Notes (Signed)
MD Job Founds at this time notifying MD of maxing out norepi gtt.

## 2021-02-24 NOTE — ED Notes (Signed)
PA at bedside and notified of Max gtt of Levophed achieved and BP still below parameters

## 2021-02-24 NOTE — ED Notes (Signed)
MD Fransico Setters at this time regarding pt PO medications due at 1000. Pt on bipap and dependent, pt desats to 70's within 1 minute of removal. MD informed. MD ordered this RN to give IV medications and hold PO for now.

## 2021-02-25 ENCOUNTER — Ambulatory Visit: Payer: Medicare Other | Admitting: Family

## 2021-02-25 ENCOUNTER — Inpatient Hospital Stay: Payer: Medicare Other

## 2021-02-25 DIAGNOSIS — R7881 Bacteremia: Secondary | ICD-10-CM

## 2021-02-25 DIAGNOSIS — B9561 Methicillin susceptible Staphylococcus aureus infection as the cause of diseases classified elsewhere: Secondary | ICD-10-CM

## 2021-02-25 DIAGNOSIS — I5023 Acute on chronic systolic (congestive) heart failure: Secondary | ICD-10-CM | POA: Diagnosis not present

## 2021-02-25 LAB — BLOOD CULTURE ID PANEL (REFLEXED) - BCID2

## 2021-02-25 LAB — T4, FREE: Free T4: 1.94 ng/dL — ABNORMAL HIGH (ref 0.61–1.12)

## 2021-02-25 LAB — CBC WITH DIFFERENTIAL/PLATELET
Abs Immature Granulocytes: 0 10*3/uL (ref 0.00–0.07)
Basophils Absolute: 0 10*3/uL (ref 0.0–0.1)
Basophils Relative: 0 %
Eosinophils Absolute: 0 10*3/uL (ref 0.0–0.5)
Eosinophils Relative: 0 %
HCT: 34.3 % — ABNORMAL LOW (ref 36.0–46.0)
Hemoglobin: 9.8 g/dL — ABNORMAL LOW (ref 12.0–15.0)
Lymphocytes Relative: 7 %
Lymphs Abs: 0.8 10*3/uL (ref 0.7–4.0)
MCH: 25.3 pg — ABNORMAL LOW (ref 26.0–34.0)
MCHC: 28.6 g/dL — ABNORMAL LOW (ref 30.0–36.0)
MCV: 88.6 fL (ref 80.0–100.0)
Monocytes Absolute: 0.5 10*3/uL (ref 0.1–1.0)
Monocytes Relative: 4 %
Neutro Abs: 10.7 10*3/uL — ABNORMAL HIGH (ref 1.7–7.7)
Neutrophils Relative %: 89 %
Platelets: 107 10*3/uL — ABNORMAL LOW (ref 150–400)
RBC: 3.87 MIL/uL (ref 3.87–5.11)
RDW: 21.4 % — ABNORMAL HIGH (ref 11.5–15.5)
WBC: 12 10*3/uL — ABNORMAL HIGH (ref 4.0–10.5)
nRBC: 0.3 % — ABNORMAL HIGH (ref 0.0–0.2)

## 2021-02-25 LAB — BASIC METABOLIC PANEL
Anion gap: 14 (ref 5–15)
BUN: 71 mg/dL — ABNORMAL HIGH (ref 8–23)
CO2: 32 mmol/L (ref 22–32)
Calcium: 8.6 mg/dL — ABNORMAL LOW (ref 8.9–10.3)
Chloride: 91 mmol/L — ABNORMAL LOW (ref 98–111)
Creatinine, Ser: 2.45 mg/dL — ABNORMAL HIGH (ref 0.44–1.00)
GFR, Estimated: 21 mL/min — ABNORMAL LOW (ref >60)
Glucose, Bld: 145 mg/dL — ABNORMAL HIGH (ref 70–99)
Potassium: 4.4 mmol/L (ref 3.5–5.1)
Sodium: 137 mmol/L (ref 135–145)

## 2021-02-25 LAB — BLOOD GAS, ARTERIAL
Acid-Base Excess: 8.6 mmol/L — ABNORMAL HIGH (ref 0.0–2.0)
Bicarbonate: 35.7 mmol/L — ABNORMAL HIGH (ref 20.0–28.0)
O2 Content: 7 L/min
O2 Saturation: 89.8 %
Patient temperature: 37
pCO2 arterial: 59 mmHg — ABNORMAL HIGH (ref 32–48)
pH, Arterial: 7.39 (ref 7.35–7.45)
pO2, Arterial: 57 mmHg — ABNORMAL LOW (ref 83–108)

## 2021-02-25 LAB — TSH: TSH: 1.465 u[IU]/mL (ref 0.350–4.500)

## 2021-02-25 LAB — PHOSPHORUS: Phosphorus: 3.5 mg/dL (ref 2.5–4.6)

## 2021-02-25 LAB — TROPONIN I (HIGH SENSITIVITY): Troponin I (High Sensitivity): 2230 ng/L (ref <18)

## 2021-02-25 LAB — MAGNESIUM: Magnesium: 2.1 mg/dL (ref 1.7–2.4)

## 2021-02-25 MED ORDER — ENSURE ENLIVE PO LIQD
237.0000 mL | Freq: Two times a day (BID) | ORAL | Status: DC
  Administered 2021-02-25: 237 mL via ORAL

## 2021-02-25 MED ORDER — MILRINONE LACTATE IN DEXTROSE 20-5 MG/100ML-% IV SOLN
0.1250 ug/kg/min | INTRAVENOUS | Status: DC
  Administered 2021-02-25 – 2021-02-26 (×2): 0.125 ug/kg/min via INTRAVENOUS
  Filled 2021-02-25 (×2): qty 100

## 2021-02-25 MED ORDER — CEFAZOLIN SODIUM-DEXTROSE 2-4 GM/100ML-% IV SOLN
2.0000 g | Freq: Two times a day (BID) | INTRAVENOUS | Status: DC
  Administered 2021-02-25 – 2021-02-26 (×4): 2 g via INTRAVENOUS
  Filled 2021-02-25 (×7): qty 100

## 2021-02-25 MED ORDER — ADULT MULTIVITAMIN W/MINERALS CH
1.0000 | ORAL_TABLET | Freq: Every day | ORAL | Status: DC
Start: 2021-02-26 — End: 2021-02-27
  Administered 2021-02-26: 1 via ORAL
  Filled 2021-02-25: qty 1

## 2021-02-25 MED ORDER — HEPARIN SODIUM (PORCINE) 5000 UNIT/ML IJ SOLN
5000.0000 [IU] | Freq: Two times a day (BID) | INTRAMUSCULAR | Status: DC
  Administered 2021-02-25 – 2021-02-26 (×3): 5000 [IU] via SUBCUTANEOUS
  Filled 2021-02-25 (×3): qty 1

## 2021-02-25 NOTE — Progress Notes (Signed)
Palliative:  Chart reviewed, patient assessed. Patient confused and unable to participate in [place] discussion. Report received from RN.  Attempted to call patient's daughter - no answer, voicemail left with call back number.   Juel Burrow, DNP, AGNP-C Palliative Medicine Team Team Phone # [phone]  Pager # [phone]  NO CHARGE

## 2021-02-25 NOTE — Progress Notes (Signed)
[place] NOTE       Patient ID: Maria Neal MRN: [id number] DOB/AGE: 1950/07/28 71 y.o.  Admit date: 02/04/2021 Referring Physician Donell Beers, NP Primary Physician Jon Billings, NP Primary Cardiologist Dr. Saralyn Pilar Reason for Consultation CHF  HPI: The patient is a 71 year old female with a past medical history of nonischemic dilated cardiomyopathy, HFrEF (LVEF 25%) s/p CRT-D 11/2014 with change out 09/2020, paroxysmal atrial fibrillation s/p cardioversion 08/2020 not on anticoagulation due to history of GI bleeding, COPD on 3 L at baseline, CKD 4 who was recently hospitalized for 4 days at [facility] for acute on chronic CHF and discharged on 2/18 after diuresis on an increased dose of torsemide. She presented to [facility] ED 02/28/2021 with reported worsening shortness of breath initially requiring BiPAP and is now on max dose of Levophed. Cardiology is consulted for assistance.  Interval History: -much more alert this afternoon during interview, although only oriented to self.  -overall poor historian, can't give me much of a history as to what transpired in the few days she was home after her last hospitalization.  -states she "feels like she has a cold" but denies chest pain, SOB, worsening LEE.   Review of systems complete and found to be negative unless listed above   Past Medical History:  Diagnosis Date   Acute appendicitis 02/17/2018   AICD (automatic cardioverter/defibrillator) present    Anxiety state    Arrhythmia    atrial fibrillation   CAD (coronary artery disease)    Cardiomyopathy (HCC)    CHF (congestive heart failure) (HCC)    COPD (chronic obstructive pulmonary disease) ([place])    Dyspnea    Headache    History of kidney stones    History of shingles    Hypertension    Influenza A 11/04/2020   On home oxygen therapy    bedtime and prn   Restless leg syndrome     Past Surgical History:  Procedure Laterality Date   CARDIAC  CATHETERIZATION  04/2014   Dr. Idelle Leech   CARDIAC CATHETERIZATION     CARDIAC DEFIBRILLATOR PLACEMENT     CARDIOVERSION N/A 08/11/2020   Procedure: CARDIOVERSION;  Surgeon: Dixie Dials, MD;  Location: [place];  Service: Cardiovascular;  Laterality: N/A;   CHOLECYSTECTOMY N/A 08/23/2016   Procedure: LAPAROSCOPIC CHOLECYSTECTOMY;  Surgeon: Clayburn Pert, MD;  Location: [facility] ORS;  Service: General;  Laterality: N/A;   COLONOSCOPY WITH PROPOFOL N/A 12/15/2020   Procedure: COLONOSCOPY WITH PROPOFOL;  Surgeon: Lin Landsman, MD;  Location: [place];  Service: Gastroenterology;  Laterality: N/A;   COLONOSCOPY WITH PROPOFOL N/A 12/16/2020   Procedure: COLONOSCOPY WITH PROPOFOL;  Surgeon: Lucilla Lame, MD;  Location: [facility] ENDOSCOPY;  Service: Endoscopy;  Laterality: N/A;   DILATATION & CURETTAGE/HYSTEROSCOPY WITH MYOSURE N/A 06/27/2018   Procedure: DILATATION & CURETTAGE/HYSTEROSCOPY WITH MYOSURE;  Surgeon: Malachy Mood, MD;  Location: [facility] ORS;  Service: Gynecology;  Laterality: N/A;   ESOPHAGOGASTRODUODENOSCOPY N/A 12/15/2020   Procedure: ESOPHAGOGASTRODUODENOSCOPY (EGD);  Surgeon: Lin Landsman, MD;  Location: [facility] ENDOSCOPY;  Service: Gastroenterology;  Laterality: N/A;   HERNIA REPAIR     LAPAROSCOPIC APPENDECTOMY N/A 02/17/2018   Procedure: APPENDECTOMY LAPAROSCOPIC;  Surgeon: Benjamine Sprague, DO;  Location: [facility] ORS;  Service: General;  Laterality: N/A;   PPM GENERATOR CHANGEOUT N/A 09/14/2020   Procedure: PPM GENERATOR CHANGEOUT;  Surgeon: Isaias Cowman, MD;  Location: [place] CV LAB;  Service: Cardiovascular;  Laterality: N/A;   TEE WITHOUT CARDIOVERSION N/A 08/10/2020   Procedure: TRANSESOPHAGEAL  ECHOCARDIOGRAM (TEE);  Surgeon: Dixie Dials, MD;  Location: [facility] ENDOSCOPY;  Service: Cardiovascular;  Laterality: N/A;    Medications Prior to Admission  Medication Sig Dispense Refill Last Dose   amiodarone (PACERONE) 200 MG tablet Take 0.5 tablets (100  mg total) by mouth daily. Home med.   02/08/2021   carvedilol (COREG) 6.25 MG tablet Take 0.5 tablets (3.125 mg total) by mouth 2 (two) times daily. Reduced from 6.25 mg twice daily.   02/19/2021   fluticasone (FLONASE) 50 MCG/ACT nasal spray Place 2 sprays into both nostrils daily. 16 g 6 02/19/2021   Fluticasone-Umeclidin-Vilant (TRELEGY ELLIPTA) 100-62.5-25 MCG/INH AEPB Inhale 1 puff into the lungs daily.   02/16/2021   sucralfate (CARAFATE) 1 g tablet Take 1 tablet (1 g total) by mouth 4 (four) times daily. 40 tablet 0 Past Week   torsemide (DEMADEX) 20 MG tablet Take 2 tablets (40 mg total) by mouth daily. Increased from 20 mg daily. 60 tablet 2 02/15/2021   albuterol (VENTOLIN HFA) 108 (90 Base) MCG/ACT inhaler Inhale 2 puffs into the lungs every 6 (six) hours as needed for wheezing or shortness of breath. 18 g 1 prn at unknown   calcitRIOL (ROCALTROL) 0.25 MCG capsule Take 0.25 mcg by mouth daily.   02/22/2021   gabapentin (NEURONTIN) 100 MG capsule Take 1 capsule (100 mg total) by mouth at bedtime. 30 capsule 3 02/22/2021   iron polysaccharides (NIFEREX) 150 MG capsule Take 1 capsule (150 mg total) by mouth daily. 30 capsule 0 02/22/2021   levothyroxine (SYNTHROID) 175 MCG tablet Take 1 tablet (175 mcg total) by mouth daily. 90 tablet 3 02/22/2021   midodrine (PROAMATINE) 10 MG tablet Take 1 tablet (10 mg total) by mouth 3 (three) times daily with meals. This is an increase from 5 mg. 90 tablet 2 02/22/2021   OXYGEN Inhale 3 L into the lungs 3 (three) times daily as needed (shortness of breath or coughing).      pantoprazole (PROTONIX) 40 MG tablet Take 1 tablet (40 mg total) by mouth daily. 90 tablet 1 02/22/2021   spironolactone (ALDACTONE) 25 MG tablet Take 12.5 mg by mouth daily.   02/22/2021   vitamin B-12 1000 MCG tablet Take 1 tablet (1,000 mcg total) by mouth daily. 30 tablet 2 02/22/2021    Social History   Socioeconomic History   Marital status: Divorced    Spouse name: Not on file    Number of children: Not on file   Years of education: Not on file   Highest education level: High school graduate  Occupational History   Occupation: retired  Tobacco Use   Smoking status: Former    Packs/day: 0.25    Types: Cigarettes   Smokeless tobacco: Never   Tobacco comments:    quit at age 15, whole pack lasted 3 weeks   Vaping Use   Vaping Use: Never used  Substance and Sexual Activity   Alcohol use: No    Alcohol/week: 0.0 standard drinks   Drug use: No   Sexual activity: Not Currently    Birth control/protection: None  Other Topics Concern   Not on file  Social History Narrative   Not on file   Social Determinants of Health   Financial Resource Strain: Not on file  Food Insecurity: Not on file  Transportation Needs: Not on file  Physical Activity: Not on file  Stress: Not on file  Social Connections: Not on file  Intimate Partner Violence: Not on file    Family History  Problem Relation Age of Onset   Diabetes Mellitus II Mother    Hypertension Mother    Heart attack Father    Arthritis Sister    Arthritis Sister    Thyroid disease Brother    Heart disease Brother    Diabetes Brother    Depression Daughter    Depression Son    Schizophrenia Son    Breast cancer Neg Hx       Review of systems complete and found to be negative unless listed above    PHYSICAL EXAM General: Elderly and chronically ill-appearing Caucasian female, sitting upright in ICU bed with lunch tray at bedside. No family present.  HEENT:  Normocephalic and atraumatic. Neck:  No JVD.  Lungs: normal WOB at rest on high flow nasal cannula. Pursed lip expirations with deep breaths and crackles bilaterally. Heart: V paced and regular . Normal S1 and S2 without gallops.  3/6 holosystolic murmur.  Radial & DP pulses 2+ bilaterally. Abdomen: Soft, obese, linear ecchymosis along her lower abdomen.  Msk: Normal strength and tone for age. Extremities: Warm and well perfused.  Left inner  forearm with warmth and erythema, somewhat improving, right hand with ecchymosis. 1+ bilateral lower extremity edema mostly in her thighs. Chronic venous stasis changes in her feet.  Neuro: Alert and oriented only to self, says the year is "2000 something", does not know where she is, the president, or why she is in the hospital.  Psych: mood appropriate for situation.   Labs:   Lab Results  Component Value Date   WBC 12.0 (H) 02/25/2021   HGB 9.8 (L) 02/25/2021   HCT 34.3 (L) 02/25/2021   MCV 88.6 02/25/2021   PLT 107 (L) 02/25/2021    Recent Labs  Lab 02/24/21 0527 02/24/21 1952 02/25/21 0424  NA 139  --  137  K 3.4*   < > 4.4  CL 98  --  91*  CO2 30  --  32  BUN 63*  --  71*  CREATININE 2.24*  --  2.45*  CALCIUM 8.0*  --  8.6*  PROT 5.3*  --   --   BILITOT 2.2*  --   --   ALKPHOS 67  --   --   ALT 21  --   --   AST 23  --   --   GLUCOSE 103*  --  145*   < > = values in this interval not displayed.    Lab Results  Component Value Date   TROPONINI <0.03 09/02/2014     Lab Results  Component Value Date   CHOL 124 09/23/2020   CHOL 202 (H) 03/10/2020   CHOL 81 (L) 03/14/2019   Lab Results  Component Value Date   HDL 51 09/23/2020   HDL 49 03/10/2020   HDL 41 03/14/2019   Lab Results  Component Value Date   LDLCALC 53 09/23/2020   LDLCALC 128 (H) 03/10/2020   LDLCALC 23 03/14/2019   Lab Results  Component Value Date   TRIG 113 09/23/2020   TRIG 140 03/10/2020   TRIG 80 03/14/2019   Lab Results  Component Value Date   CHOLHDL 2.4 09/23/2020   CHOLHDL 4.1 03/10/2020   No results found for: LDLDIRECT    Radiology: CT FOREARM LEFT WO CONTRAST  Result Date: 02/24/2021 CLINICAL DATA:  Left forearm pain and swelling. EXAM: CT OF THE LEFT FOREARM WITHOUT CONTRAST TECHNIQUE: Multidetector CT imaging was performed according to the standard protocol. Multiplanar CT image  reconstructions were also generated. RADIATION DOSE REDUCTION: This exam was performed  according to the departmental dose-optimization program which includes automated exposure control, adjustment of the mA and/or kV according to patient size and/or use of iterative reconstruction technique. COMPARISON:  None. FINDINGS: Subcutaneous soft tissue swelling/edema possibly reflecting cellulitis. No discrete fluid collection to suggest abscess. No findings suspicious for septic arthritis or osteomyelitis. No obvious pyomyositis. No obvious gas in the soft tissues. Severe diffuse left-sided body wall edema also noted. IMPRESSION: 1. Subcutaneous soft tissue swelling/edema possibly reflecting cellulitis. No discrete fluid collection to suggest abscess. 2. No findings suspicious for septic arthritis osteomyelitis. 3. Severe diffuse left-sided body wall edema. Electronically Signed   By: Marijo Sanes M.D.   On: 02/24/2021 18:54   US Venous Img Lower Bilateral (DVT)  Result Date: 02/25/2021 CLINICAL DATA:  Bilateral lower extremity edema. EXAM: BILATERAL LOWER EXTREMITY VENOUS DOPPLER ULTRASOUND TECHNIQUE: Gray-scale sonography with graded compression, as well as color Doppler and duplex ultrasound were performed to evaluate the lower extremity deep venous systems from the level of the common femoral vein and including the common femoral, femoral, profunda femoral, popliteal and calf veins including the posterior tibial, peroneal and gastrocnemius veins when visible. The superficial great saphenous vein was also interrogated. Spectral Doppler was utilized to evaluate flow at rest and with distal augmentation maneuvers in the common femoral, femoral and popliteal veins. COMPARISON:  None. FINDINGS: RIGHT LOWER EXTREMITY Common Femoral Vein: No evidence of thrombus. Normal compressibility, respiratory phasicity and response to augmentation. Saphenofemoral Junction: No evidence of thrombus. Normal compressibility and flow on color Doppler imaging. Profunda Femoral Vein: No evidence of thrombus. Normal  compressibility and flow on color Doppler imaging. Femoral Vein: No evidence of thrombus. Normal compressibility, respiratory phasicity and response to augmentation. Popliteal Vein: No evidence of thrombus. Normal compressibility, respiratory phasicity and response to augmentation. Calf Veins: No evidence of thrombus. Normal compressibility and flow on color Doppler imaging. Superficial Great Saphenous Vein: Suggestion of a mild amount of nonocclusive mural thrombus in the proximal/upper thigh segment of the right GSV. There is clearly flow through this segment and this is not causing significant luminal narrowing. Venous Reflux:  None. Other Findings:  No abnormal fluid collection. LEFT LOWER EXTREMITY Common Femoral Vein: No evidence of thrombus. Normal compressibility, respiratory phasicity and response to augmentation. Saphenofemoral Junction: No evidence of thrombus. Normal compressibility and flow on color Doppler imaging. Profunda Femoral Vein: No evidence of thrombus. Normal compressibility and flow on color Doppler imaging. Femoral Vein: No evidence of thrombus. Normal compressibility, respiratory phasicity and response to augmentation. Popliteal Vein: No evidence of thrombus. Normal compressibility, respiratory phasicity and response to augmentation. Calf Veins: No evidence of thrombus. Normal compressibility and flow on color Doppler imaging. Superficial Great Saphenous Vein: No evidence of thrombus. Normal compressibility. Venous Reflux:  None. Other Findings: No evidence of superficial thrombophlebitis or abnormal fluid collection. IMPRESSION: 1. No evidence of deep venous thrombosis in either lower extremity. 2. Age indeterminate nonocclusive superficial thrombophlebitis in the upper thigh segment of the right great saphenous vein. Electronically Signed   By: Aletta Edouard M.D.   On: 02/25/2021 09:57   DG Chest Port 1 View  Result Date: 02/24/2021 CLINICAL DATA:  Difficulty breathing, hypoxia  EXAM: PORTABLE CHEST 1 VIEW COMPARISON:  Previous studies including the examination of 02/05/2021 FINDINGS: Transverse diameter of heart is increased. Pacemaker/defibrillator battery is seen in the left infraclavicular region. Biventricular pacer leads are noted in place. Central pulmonary vessels are slightly more prominent. There  is no focal pulmonary consolidation. There is no significant pleural effusion or pneumothorax. IMPRESSION: Cardiomegaly. Central pulmonary vessels are more prominent, possibly suggesting mild CHF. No new focal infiltrates are seen. Electronically Signed   By: Elmer Picker M.D.   On: 02/24/2021 14:48   DG Chest Portable 1 View  Result Date: 02/02/2021 CLINICAL DATA:  Shortness of breath, dyspnea, hypoxia. EXAM: PORTABLE CHEST 1 VIEW COMPARISON:  Portable chest 02/15/2021. FINDINGS: Severe cardiomegaly is again noted with perihilar vascular engorgement and flow cephalization without overt edema findings or pleural collections. The lungs are generally clear, but the lower lung fields are suboptimally evaluated due to the enlarged heart superimposing. Stable mediastinum with mild aortic tortuosity with calcification in the aortic arch. Again noted is a left chest pacing system with AID wiring, unchanged. Thoracic spondylosis. IMPRESSION: Stable cardiomegaly and vascular engorgement without overt edema. Lungs are clear of focal consolidation with the lower lung fields suboptimally assessed due to superimposition of the enlarged heart. Electronically Signed   By: Telford Nab M.D.   On: 02/05/2021 22:32   DG Chest Portable 1 View  Result Date: 02/15/2021 CLINICAL DATA:  Short of breath and leg swelling and weakness EXAM: PORTABLE CHEST 1 VIEW COMPARISON:  01/10/2021 FINDINGS: Cardiac enlargement. AICD unchanged in position. Mild vascular congestion. Negative for edema or effusion. Mild bibasilar atelectasis. IMPRESSION: Mild pulmonary vascular congestion without edema. Mild  bibasilar atelectasis. Marked cardiomegaly. Electronically Signed   By: Franchot Gallo M.D.   On: 02/15/2021 10:42   US BREAST LTD UNI LEFT INC AXILLA  Result Date: 02/02/2021 CLINICAL DATA:  Left breast asymmetry and calcifications seen on patient's new baseline mammogram. EXAM: DIGITAL DIAGNOSTIC UNILATERAL LEFT MAMMOGRAM WITH TOMOSYNTHESIS AND CAD; ULTRASOUND LEFT BREAST LIMITED TECHNIQUE: Left digital diagnostic mammography and breast tomosynthesis was performed. The images were evaluated with computer-aided detection.; Targeted ultrasound examination of the left breast was performed. COMPARISON:  Previous exam(s). ACR Breast Density Category c: The breast tissue is heterogeneously dense, which may obscure small masses. FINDINGS: Additional mammographic views of the left breast demonstrate loosely grouped punctate calcifications in the slightly lower outer left breast, middle and anterior depth. The area of calcifications spans 6.6 cm in anterior to posterior dimension. No associated masses are seen. Targeted left breast ultrasound is performed demonstrating 2 probably benign cysts at 2:30 o'clock 3 cm from the nipple measuring 0.6 x 0.4 x 0.5 cm and 0.5 x 0.5 x 0.5 cm. IMPRESSION: Loosely grouped calcifications in the left breast are mildly suspicious. Recommend 1 site stereotactic core needle biopsy of the anterior or posterior extent of involvement. Two probably benign left breast 2:30 o'clock masses. With benign pathology results, recommend six-month follow-up for the remaining of the calcifications and the probably benign left breast masses. RECOMMENDATION: One site stereotactic core needle biopsy of the left breast. I have discussed the findings and recommendations with the patient. If applicable, a reminder letter will be sent to the patient regarding the next appointment. BI-RADS CATEGORY  4: Suspicious. Electronically Signed   By: Fidela Salisbury M.D.   On: 02/02/2021 11:12  MM DIAG BREAST TOMO  UNI LEFT  Result Date: 02/02/2021 CLINICAL DATA:  Left breast asymmetry and calcifications seen on patient's new baseline mammogram. EXAM: DIGITAL DIAGNOSTIC UNILATERAL LEFT MAMMOGRAM WITH TOMOSYNTHESIS AND CAD; ULTRASOUND LEFT BREAST LIMITED TECHNIQUE: Left digital diagnostic mammography and breast tomosynthesis was performed. The images were evaluated with computer-aided detection.; Targeted ultrasound examination of the left breast was performed. COMPARISON:  Previous exam(s). ACR Breast Density  Category c: The breast tissue is heterogeneously dense, which may obscure small masses. FINDINGS: Additional mammographic views of the left breast demonstrate loosely grouped punctate calcifications in the slightly lower outer left breast, middle and anterior depth. The area of calcifications spans 6.6 cm in anterior to posterior dimension. No associated masses are seen. Targeted left breast ultrasound is performed demonstrating 2 probably benign cysts at 2:30 o'clock 3 cm from the nipple measuring 0.6 x 0.4 x 0.5 cm and 0.5 x 0.5 x 0.5 cm. IMPRESSION: Loosely grouped calcifications in the left breast are mildly suspicious. Recommend 1 site stereotactic core needle biopsy of the anterior or posterior extent of involvement. Two probably benign left breast 2:30 o'clock masses. With benign pathology results, recommend six-month follow-up for the remaining of the calcifications and the probably benign left breast masses. RECOMMENDATION: One site stereotactic core needle biopsy of the left breast. I have discussed the findings and recommendations with the patient. If applicable, a reminder letter will be sent to the patient regarding the next appointment. BI-RADS CATEGORY  4: Suspicious. Electronically Signed   By: Fidela Salisbury M.D.   On: 02/02/2021 11:12  ECHOCARDIOGRAM COMPLETE  Result Date: 02/16/2021    ECHOCARDIOGRAM REPORT   Patient Name:   Maria Neal Date of Exam: 02/15/2021 Medical Rec #:  [id number]     Height:       65.7 in Accession #:    [id number]   Weight:       161.0 lb Date of Birth:  1950/12/12    BSA:          1.819 m Patient Age:    70 years     BP:           112/54 mmHg Patient Gender: F            HR:           73 bpm. Exam Location:  [facility] Procedure: 2D Echo, Cardiac Doppler and Color Doppler Indications:     CHF-acute systolic H47.65  History:         Patient has prior history of Echocardiogram examinations, most                  recent 08/10/2020. CAD, AICD, COPD; Risk Factors:Hypertension.  Sonographer:     Sherrie Sport Referring Phys:  [place] Diagnosing Phys: Serafina Royals MD  Sonographer Comments: Suboptimal apical window. IMPRESSIONS  1. Left ventricular ejection fraction, by estimation, is <20%. The left ventricle has severely decreased function. The left ventricle demonstrates global hypokinesis. The left ventricular internal cavity size was severely dilated. There is mild left ventricular hypertrophy. Left ventricular diastolic parameters are consistent with Grade II diastolic dysfunction (pseudonormalization).  2. Right ventricular systolic function is mildly reduced. The right ventricular size is moderately enlarged.  3. Left atrial size was mildly dilated.  4. Right atrial size was mildly dilated.  5. The mitral valve is normal in structure. Severe mitral valve regurgitation.  6. Tricuspid valve regurgitation is moderate.  7. The aortic valve is normal in structure. Aortic valve regurgitation is trivial. FINDINGS  Left Ventricle: Left ventricular ejection fraction, by estimation, is <20%. The left ventricle has severely decreased function. The left ventricle demonstrates global hypokinesis. The left ventricular internal cavity size was severely dilated. There is mild left ventricular hypertrophy. Left ventricular diastolic parameters are consistent with Grade II diastolic dysfunction (pseudonormalization). Right Ventricle: The right ventricular size is moderately enlarged. No  increase in right ventricular wall  thickness. Right ventricular systolic function is mildly reduced. Left Atrium: Left atrial size was mildly dilated. Right Atrium: Right atrial size was mildly dilated. Pericardium: There is no evidence of pericardial effusion. Mitral Valve: The mitral valve is normal in structure. Severe mitral valve regurgitation. MV peak gradient, 6.0 mmHg. The mean mitral valve gradient is 2.0 mmHg. Tricuspid Valve: The tricuspid valve is normal in structure. Tricuspid valve regurgitation is moderate. Aortic Valve: The aortic valve is normal in structure. Aortic valve regurgitation is trivial. Aortic regurgitation PHT measures 394 msec. Aortic valve mean gradient measures 3.7 mmHg. Aortic valve peak gradient measures 6.7 mmHg. Aortic valve area, by VTI measures 1.50 cm. Pulmonic Valve: The pulmonic valve was normal in structure. Pulmonic valve regurgitation is trivial. Aorta: The aortic root and ascending aorta are structurally normal, with no evidence of dilitation. IAS/Shunts: No atrial level shunt detected by color flow Doppler.  LEFT VENTRICLE PLAX 2D LVIDd:         7.10 cm      Diastology LVIDs:         6.70 cm      LV e' medial:    6.20 cm/s LV PW:         1.10 cm      LV E/e' medial:  14.6 LV IVS:        0.80 cm      LV e' lateral:   7.83 cm/s LVOT diam:     2.00 cm      LV E/e' lateral: 11.6 LV SV:         30 LV SV Index:   17 LVOT Area:     3.14 cm  LV Volumes (MOD) LV vol d, MOD A2C: 221.0 ml LV vol d, MOD A4C: 258.0 ml LV vol s, MOD A2C: 99.7 ml LV vol s, MOD A4C: 179.0 ml LV SV MOD A2C:     121.3 ml LV SV MOD A4C:     258.0 ml LV SV MOD BP:      105.8 ml RIGHT VENTRICLE RV Basal diam:  6.60 cm RV S prime:     9.46 cm/s TAPSE (M-mode): 2.3 cm LEFT ATRIUM              Index        RIGHT ATRIUM           Index LA diam:        6.10 cm  3.35 cm/m   RA Area:     29.60 cm LA Vol (A2C):   170.0 ml 93.46 ml/m  RA Volume:   109.00 ml 59.92 ml/m LA Vol (A4C):   163.0 ml 89.61 ml/m LA  Biplane Vol: 173.0 ml 95.11 ml/m  AORTIC VALVE AV Area (Vmax):    1.16 cm AV Area (Vmean):   1.21 cm AV Area (VTI):     1.50 cm AV Vmax:           129.33 cm/s AV Vmean:          86.467 cm/s AV VTI:            0.202 m AV Peak Grad:      6.7 mmHg AV Mean Grad:      3.7 mmHg LVOT Vmax:         47.60 cm/s LVOT Vmean:        33.400 cm/s LVOT VTI:          0.097 m LVOT/AV VTI ratio: 0.48 AI PHT:  394 msec  AORTA Ao Root diam: 2.90 cm MITRAL VALVE               TRICUSPID VALVE MV Area (PHT): 3.77 cm    TR Peak grad:   40.4 mmHg MV Area VTI:   1.36 cm    TR Vmax:        318.00 cm/s MV Peak grad:  6.0 mmHg MV Mean grad:  2.0 mmHg    SHUNTS MV Vmax:       1.22 m/s    Systemic VTI:  0.10 m MV Vmean:      67.0 cm/s   Systemic Diam: 2.00 cm MV Decel Time: 201 msec MV E velocity: 90.80 cm/s MV A velocity: 44.60 cm/s MV E/A ratio:  2.04 Serafina Royals MD Electronically signed by Serafina Royals MD Signature Date/Time: 02/16/2021/8:07:09 AM    Final     ECHO LVEF less than 20%, severe MR, G2 DD 02/15/2021  TELEMETRY reviewed by me: V paced rates 70s - 80s  EKG reviewed by me: V paced at 74 without acute ST changes  ASSESSMENT AND PLAN:  The patient is a 71 year old female with a past medical history of nonischemic dilated cardiomyopathy, HFrEF (LVEF 25%) s/p CRT-D 11/2014 with change out 09/2020, paroxysmal atrial fibrillation s/p cardioversion 08/2020 not on anticoagulation due to history of GI bleeding, COPD on 3 L at baseline, CKD 4 who was recently hospitalized for 4 days at [facility] for acute on chronic CHF and discharged on 2/18 after appropriate diuresis on an increased dose of torsemide. She presented to [facility] ED 02/21/2021 with shortness of breath.  Cardiology is consulted for assistance with her heart failure.  #Acute on chronic HFrEF (02/15/21 LVEF <20%, severe MR, g2 DD) #Nonischemic dilated cardiomyopathy s/p CRT-D change out 09/2020 #elevated troponin 2/2 demand ischemia vs NSTEMI The patient  represents 4 days after discharge with reported worsening shortness of breath, lower extremity edema, weakness and increased somnolence since she was discharged home. History is limited from her brother at beside who does not live with her. The patient was very somnolent during interview on 2/23 but is much more alert and respiratory status has improved somewhat on 2/24. Her BNP is markedly elevated to >4500, troponin peaked at 2400, procal elevated to 2.2 and Ddimer 3.04. Elevated troponin multifactorial from hypotension and hypoxia but NSTEMI can't be excluded. Favor hypotension/hypoxia.  -continue BP support with levophed and midodrine. Wean as tolerated.  -still appears grossly volume overloaded. BP still tenuous in low 100s / high 90s.  -s/p IV lasix 60mg  x2. Further lasix held now. She will need further diuresis as her BP improves.  -off home GDMT of coreg and torsemide and spironolactone d/t hypotension and critical illness as above.  -defer further cardiac diagnostics at this time.   #LUE cellulitis and staph aureus bacteremia -agree with current management by primary team.  -WBCs 12 today  #elevated D dimer -3.04 on admission. CTA unable to be obtained d/t renal function.  -bilateral LE ultrasound negative for DVT, nonocclusive superficial thrombophlebitis in upper thigh segment of R great saphenous vein  #Acute hypoxic respiratory failure #COPD on 3 L at baseline O2 weaned today to 5L by HFNC  Procal 2.2.   #CKD 4 Renal function currently at baseline. Cr 2.24-2.45 and GFR 23-21.   #Paroxysmal atrial fibrillation not on anticoagulation due to history of GI bleed and multiple falls at home Currently V paced with rates in the 70s-80s during interview. EKG without acute changes.  -  continue amiodarone, hold BB.  This patient's plan of care was discussed and created with Dr. Lujean Amel and he is in agreement.  Signed: Tristan Schroeder , PA-C 02/25/2021, 2:01 PM [facility]

## 2021-02-25 NOTE — TOC Initial Note (Signed)
Transition of Care [facility]) - Initial/Assessment Note    Patient Details  Name: Maria Neal MRN: [id number] Date of Birth: 04-12-1950  Transition of Care [facility]) CM/SW Contact:    Shelbie Hutching, RN Phone Number: 02/25/2021, 3:52 PM  Clinical Narrative:                 Patient admitted to the hospital with acute on chronic systolic CHF.  Patient is from home with her daughter.  She wears chronic O2 at 3 L through [employer].  Patient has a cane, 3 in1 and walker at home.  Open with [facility] for RN, PT, and OT.  Patient is currently on HFNC at 5L and requiring levophed to maintain blood pressure.  Palliative Care has been consulted to discuss [place].    Expected Discharge Plan: [place] Barriers to Discharge: Continued Medical Work up   Patient Goals and CMS Choice Patient states their goals for this hospitalization and ongoing recovery are:: patient confused CMS Medicare.gov Compare Post Acute Care list provided to:: Patient Represenative (must comment) Choice offered to / list presented to : Adult Children  Expected Discharge Plan and Services Expected Discharge Plan: [place]   Discharge Planning Services: CM Consult Post Acute Care Choice: [place] arrangements for the past 2 months: Single Family Home                 DME Arranged: N/A DME Agency: NA       HH Arranged: RN, PT, OT [place] Agency: [place] ([facility]) Date HH Agency Contacted: 02/25/21 Time [place]: [phone] Representative spoke with at [place]: [place] Arrangements/Services Living arrangements for the past 2 months: [place] Lives with:: Adult Children Patient language and need for interpreter reviewed:: Yes Do you feel safe going back to the place where you live?: Yes      Need for Family Participation in Patient Care: Yes (Comment) Care giver support system in place?: Yes (comment) Current home services: DME  (oxygen) Criminal Activity/Legal Involvement Pertinent to Current Situation/Hospitalization: No - Comment as needed  Activities of Daily Living      Permission Sought/Granted Permission sought to share information with : Case Manager Permission granted to share information with : Yes, Verbal Permission Granted  Share Information with NAME: Jannett Schmall  Permission granted to share info w AGENCY: [facility]  Permission granted to share info w Relationship: daughter  Permission granted to share info w Contact Information: [phone]  Emotional Assessment Appearance:: Appears older than stated age   Affect (typically observed): Unable to Assess Orientation: : Fluctuating Orientation (Suspected and/or reported Sundowners) Alcohol / Substance Use: Not Applicable Psych Involvement: No (comment)  Admission diagnosis:  Edema [R60.9] Acute respiratory failure with hypoxia (HCC) [J96.01] Acute on chronic systolic (congestive) heart failure (HCC) [I50.23] Acute on chronic congestive heart failure, unspecified heart failure type [facility]) [I50.9] Patient Active Problem List   Diagnosis Date Noted   Acute renal failure superimposed on stage 3b chronic kidney disease ([place]) 02/24/2021   Hypokalemia 02/24/2021   Hypomagnesemia 02/24/2021   Acute on chronic systolic (congestive) heart failure ([place]) 02/21/2021   Acute exacerbation of CHF (congestive heart failure) ([place]) 02/15/2021   Hypoxemia 01/10/2021   Pedal edema 01/10/2021   GI bleed 12/15/2020   Acute blood loss anemia (ABLA) 12/14/2020   Acute GI bleeding    Aortic atherosclerosis ([place]) 12/13/2020   Thrombophilia ([place]) 12/13/2020   COPD exacerbation ([place])  11/04/2020   Chronic respiratory failure with hypoxia (HCC) - 3 L/min 11/04/2020   Acute on chronic systolic CHF (congestive heart failure) ([place]) 11/04/2020   Acute on chronic respiratory failure with hypoxia ([place]) - home O2 @ 3 L/min 11/04/2020   Chronic a-fib ([place]) 11/04/2020    Acquired hypothyroidism 10/14/2020   Current use of long term anticoagulation 16/10/9602   Acute systolic heart failure ([place]) 08/07/2020   Medication management 06/25/2020   Chronic kidney disease, stage 3a ([place]) 09/04/2019   IFG (impaired fasting glucose) 04/01/2019   Leg pain 07/23/2018   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/23/2018   Chronic venous insufficiency 07/23/2018   Simple endometrial hyperplasia without atypia 07/04/2018   Epigastric pain 06/28/2018   Cervical radiculitis 05/17/2017   Hypercholesteremia 05/02/2017   Obesity (BMI 30-39.9) 05/02/2017   Mitral valve insufficiency 04/16/2017   Closed fracture of lateral malleolus 54/09/8117   Chronic systolic congestive heart failure, NYHA class 3 ([place]) 08/07/2016   NICM (nonischemic cardiomyopathy) ([place]) 08/07/2016   S/P ICD (internal cardiac defibrillator) procedure 12/02/2014   Bundle branch block, 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 [address] Phone: [phone] Fax: [place], [place] [place] [address] Phone: [phone] Fax: [phone]     Social Determinants of Health (SDOH) Interventions    Readmission Risk Interventions Readmission Risk Prevention Plan 02/16/2021 12/17/2020  Transportation Screening Complete Complete  PCP or Specialist Appt within 3-5 Days Complete Complete  HRI or Home Care Consult Complete Complete  Palliative Care Screening Not Applicable Not  Applicable  Medication Review (RN Care Manager) Complete Referral to Pharmacy  Some recent data might be hidden

## 2021-02-25 NOTE — Progress Notes (Signed)
Upon initial assessment patients left forearm reddened, swollen and painful. Two PIV's located in patients left forearm, one infusing Levophed with IV Watch in place. Stopped current infusion and switched Levophed to infuse in her right forearm PIV. Critical Care NP aware of patients left forearm discomfort and swelling. IV team consult and for midline placement which was unsuccessful, PIV insertion attempt by critical care NP also unsuccessful.   Dr. Delaine Lame with Infectious Disease notified that IV team unable to place midline and unable to obtain any additional PIV's. This RN advised to keep left sided PIV's at this time d/t not being able to place a central line or PICC because of patients current infection/positive blood cultures. Dr. Delaine Lame and Sharyn Creamer., NP agreeable.

## 2021-02-25 NOTE — Progress Notes (Addendum)
NAME:  Maria Neal, MRN:  [id number], DOB:  Oct 08, 1950, LOS: 2 ADMISSION DATE:  02/26/2021, CONSULTATION DATE: 02/24/2021 REFERRING MD: Dr. Tawanna Solo, CHIEF COMPLAINT: Hypotension    History of Present Illness:  This is a 71 yo female who presented to [facility] ER on 02/22 from home via EMS with a 1-2 day hx of worsening shortness of breath and hypoxia with O2 sats in the 80's (wears 3L O2 at baseline).  She was recently hospitalized from 02/14~02/18 following treatment of acute on chronic combined CHF exacerbation pt discharged with increased dose of torsemide 40 mg daily.    ED course Upon arrival to the ER lab results revealed chloride 91, glucose 100, BUN 67, creatinine 2.42, BNP >4,500, troponin 131, hgb 9.5, platelets 121, PT 17.8/INR 1.5, and UA with trace leukocytes and rare bacteria.  CXR revealed stable cardiomegaly and vascular engorgement without overt edema.  COVID-19/Influenza PCR A&B negative.  Per ER notes pt noted to have rales and 3+ bilateral lower extremity pitting edema.  Pt also hypoxic initially requiring NRB, then transitioned to Bipap.  She also received 60 mg iv lasix x2 doses and sublingual nitroglycerin x1 dose.  Pt admitted to the stepdown unit per hospitalist team, but remained in the ER pending bed availability.    PCCM team consulted 02/23 due to pt developing hypotension map in the 50's requiring levophed gtt.  Pts status changed to ICU.   Pertinent  Medical History  Acute Appendicitis  AICD Anxiety  Atrial Fibrillation  CAD  Cardiomyopathy  COPD on chronic O2 @3L   Chronic Diastolic Systolic CHF~EF <59% via Echo 02/15/2021 Headache Kidney Stones  HTN Influenza A  Restless Leg Syndrome   Significant Hospital Events: Including procedures, antibiotic start and stop dates in addition to other pertinent events   02/22: Pt admitted to the stepdown unit with acute on chronic respiratory failure secondary to CHF exacerbation requiring intermittent Bipap remained in  the ER pending bed availability  02/23: PCCM team consulted pt developed hypotension requiring levophed gtt and transfer to ICU 02/23: CT Forearm Left revealed subcutaneous soft tissue swelling/edema possibly reflecting cellulitis. No discrete fluid collection to suggest abscess. No findings suspicious for septic arthritis osteomyelitis. Severe diffuse left-sided body wall edema 02/24: Weaning levophed gtt and HFNC; Blood cultures positive for staph aureus 4/4 bottles abx changed from ceftriaxone to cefazolin   Interim History / Subjective:  Pt much more alert today, however remains confused.  Levophed gtt weaned from 10 mcg/min to 7 mcg/min with stable bp readings.  HFNC weaned from 15L to 5L with O2 sats upper 90's and no signs of respiratory distress   Objective   Blood pressure (!) 105/49, pulse 69, temperature 97.8 F (36.6 C), temperature source Oral, resp. rate (!) 22, height 5\' 4"  (1.626 m), weight 77.8 kg, SpO2 94 %.        Intake/Output Summary (Last 24 hours) at 02/25/2021 0748 Last data filed at 02/25/2021 0300 Gross per 24 hour  Intake --  Output 300 ml  Net -300 ml   Filed Weights   02/13/2021 2158 02/25/21 0218  Weight: 81 kg 77.8 kg    Examination: General: acute on chronically ill appearing female asleep in bed, NAD on 5L HFNC  HENT: supple, kyphosis  Lungs: diminished throughout, even, non labored  Cardiovascular: ventricular paced rhythm, no R/G, 2+ radial/1+ distal pulses present, 1+ bilateral lower extremity edema; 2+ LUE edema  Abdomen: + BS x4, obese, soft, non tender, non distended  Extremities: normal bulk,  moves all extremities  Neuro: lethargic, follows commands, disoriented to situation/time, PERRL Skin: left wrist erythematous and warm due to cellulitis, scattered ecchymosis   GU: voiding via [facility] Problem list   Hypokalemia   Assessment & Plan:  Acute on chronic hypoxic respiratory failure secondary to acute CHF exacerbation  also concerning for possible PE  Hx: COPD and Chronic Home O2 @3L   -Supplemental O2 or prn Bipap for dyspnea and/or hypoxia -Maintain O2 sats 88% to 92% -Continue scheduled and prn bronchodilator therapy  -Unable to obtain CTA Chest due to CKD; will check bilateral venous US to assess for VTE if positive due to inability to anticoagulate due to GI bleed hx pt will need an IVC filter   Acute on chronic diastolic systolic CHF with severe mitral valve regurgitation ~EF <20% via Echo 02/15/2021 Nonischemic dilated cardiomyopathy  Sepsis with septic shock secondary bacteremia and cardiogenic shock  Elevated troponin secondary to demand ischemia vs. NSTEMI~troponin's peaked at 2,480 Hx: AICD, Paroxysmal Atrial Fibrillation (unable to anticoagulate due to GI bleed secondary to eliquis), Chronic Hypotension   -Continuous telemetry monitoring  -Continue scheduled midodrine and prn levophed gtt to maintain map 60 or higher -Hold diuretic and beta blocker therapy for now due to hypotension; continue outpatient amiodarone -Cardiology consulted appreciate input   CKD stage IV Hypomagnesia -Trend BMP -Replace electrolytes as indicated  -Monitor UOP -Avoid nephrotoxic medications   LUE cellulitis~Staph Aureus Bacteremia (blood cultures positive for staph aureus in 4/4 bottles 02/24)  -Trend WBC and monitor fever curve  -Trend PCT  -Follow cultures -ABX changed to cefazolin~02/24  Chronic normocytic anemia  Hx: GI bleed  -Trend CBC  -Monitor for s/sx of bleeding and transfuse for hgb <7 -Avoid chemical VTE px   Hypothyroidism -Continue synthroid -TSH and free T4 levels pending   GERD  -Continue PPI   Acute toxic encephalopathy secondary to sepsis  -Frequent reorientation -Avoid sedating medication  Best Practice (right click and "Reselect all SmartList Selections" daily)   Diet/type: Regular consistency (see orders) DVT prophylaxis: SCD GI prophylaxis: PPI Lines: N/A Foley:   N/A Code Status:  full code Last date of multidisciplinary goals of care discussion [02/25/2021]  Care management consult placed for SNF placement at discharge per pts daughter request.  Will consult PT/OT once pt off vasopressors.  Attempted to update pts daughter Gaynelle Pastrana via telephone, however she did not answer left voicemail message instructing her to return my phone call.  Labs   CBC: Recent Labs  Lab 02/19/21 0531 02/11/2021 2202 02/24/21 0527 02/25/21 0424  WBC 4.3 8.7 9.1 12.0*  NEUTROABS  --   --  7.8* 10.7*  HGB 9.5* 9.5* 8.8* 9.8*  HCT 33.3* 33.5* 30.6* 34.3*  MCV 87.9 87.9 88.2 88.6  PLT 146* 121* 94* 107*    Basic Metabolic Panel: Recent Labs  Lab 02/19/21 0531 02/24/2021 2202 02/24/21 0527 02/24/21 1952 02/25/21 0424  NA 139 136 139  --  137  K 4.1 4.1 3.4* 4.3 4.4  CL 93* 91* 98  --  91*  CO2 34* 31 30  --  32  GLUCOSE 94 100* 103*  --  145*  BUN 71* 67* 63*  --  71*  CREATININE 2.25* 2.42* 2.24*  --  2.45*  CALCIUM 9.7 8.9 8.0*  --  8.6*  MG 2.0 1.8 1.6* 1.9  --   PHOS  --   --  3.0  --  3.5   GFR: Estimated Creatinine Clearance: 21.6 mL/min (  A) (by C-G formula based on SCr of 2.45 mg/dL (H)). Recent Labs  Lab 02/19/21 0531 02/09/2021 2202 02/24/21 0527 02/24/21 1743 02/25/21 0424  PROCALCITON  --   --  2.20  --   --   WBC 4.3 8.7 9.1  --  12.0*  LATICACIDVEN  --   --   --  1.9  --     Liver Function Tests: Recent Labs  Lab 02/24/21 0527  AST 23  ALT 21  ALKPHOS 67  BILITOT 2.2*  PROT 5.3*  ALBUMIN 2.4*   No results for input(s): LIPASE, AMYLASE in the last 168 hours. No results for input(s): AMMONIA in the last 168 hours.  ABG    Component Value Date/Time   PHART 7.39 02/25/2021 0451   PCO2ART 59 (H) 02/25/2021 0451   PO2ART 57 (L) 02/25/2021 0451   HCO3 35.7 (H) 02/25/2021 0451   TCO2 29 08/07/2020 1302   O2SAT 89.8 02/25/2021 0451     Coagulation Profile: Recent Labs  Lab 02/04/2021 2202  INR 1.5*    Cardiac  Enzymes: No results for input(s): CKTOTAL, CKMB, CKMBINDEX, TROPONINI in the last 168 hours.  HbA1C: Hgb A1c MFr Bld  Date/Time Value Ref Range Status  09/23/2020 11:07 AM 5.8 (H) 4.8 - 5.6 % Final    Comment:             Prediabetes: 5.7 - 6.4          Diabetes: >6.4          Glycemic control for adults with diabetes: <7.0   03/10/2020 03:36 PM 5.6 4.8 - 5.6 % Final    Comment:             Prediabetes: 5.7 - 6.4          Diabetes: >6.4          Glycemic control for adults with diabetes: <7.0     CBG: Recent Labs  Lab 02/24/21 2010  GLUCAP 145*    Review of Systems: Positives in BOLD   Gen: Denies fever, chills, weight change, fatigue, night sweats HEENT: Denies blurred vision, double vision, hearing loss, tinnitus, sinus congestion, rhinorrhea, sore throat, neck stiffness, dysphagia PULM: shortness of breath, cough, sputum production, hemoptysis, wheezing CV: Denies chest pain, edema, orthopnea, paroxysmal nocturnal dyspnea, palpitations GI: Denies abdominal pain, nausea, vomiting, diarrhea, hematochezia, melena, constipation, change in bowel habits GU: Denies dysuria, hematuria, polyuria, oliguria, urethral discharge Endocrine: Denies hot or cold intolerance, polyuria, polyphagia or appetite change Derm: left forearm pain/erythema, rash, dry skin, scaling or peeling skin change Heme: Denies easy bruising, bleeding, bleeding gums Neuro: Denies headache, numbness, weakness, slurred speech, loss of memory or consciousness  Past Medical History:  She,  has a past medical history of Acute appendicitis (02/17/2018), AICD (automatic cardioverter/defibrillator) present, Anxiety state, Arrhythmia, CAD (coronary artery disease), Cardiomyopathy ([place]), CHF (congestive heart failure) ([place]), COPD (chronic obstructive pulmonary disease) ([place]), Dyspnea, Headache, History of kidney stones, History of shingles, Hypertension, Influenza A (11/04/2020), On home oxygen therapy, and Restless leg  syndrome.   Surgical History:   Past Surgical History:  Procedure Laterality Date   CARDIAC CATHETERIZATION  04/2014   Dr. Idelle Leech   CARDIAC CATHETERIZATION     CARDIAC DEFIBRILLATOR PLACEMENT     CARDIOVERSION N/A 08/11/2020   Procedure: CARDIOVERSION;  Surgeon: Dixie Dials, MD;  Location: [facility] ENDOSCOPY;  Service: Cardiovascular;  Laterality: N/A;   CHOLECYSTECTOMY N/A 08/23/2016   Procedure: LAPAROSCOPIC CHOLECYSTECTOMY;  Surgeon: Clayburn Pert, MD;  Location: [facility]  ORS;  Service: General;  Laterality: N/A;   COLONOSCOPY WITH PROPOFOL N/A 12/15/2020   Procedure: COLONOSCOPY WITH PROPOFOL;  Surgeon: Lin Landsman, MD;  Location: [facility] ENDOSCOPY;  Service: Gastroenterology;  Laterality: N/A;   COLONOSCOPY WITH PROPOFOL N/A 12/16/2020   Procedure: COLONOSCOPY WITH PROPOFOL;  Surgeon: Lucilla Lame, MD;  Location: [facility] ENDOSCOPY;  Service: Endoscopy;  Laterality: N/A;   DILATATION & CURETTAGE/HYSTEROSCOPY WITH MYOSURE N/A 06/27/2018   Procedure: DILATATION & CURETTAGE/HYSTEROSCOPY WITH MYOSURE;  Surgeon: Malachy Mood, MD;  Location: [facility] ORS;  Service: Gynecology;  Laterality: N/A;   ESOPHAGOGASTRODUODENOSCOPY N/A 12/15/2020   Procedure: ESOPHAGOGASTRODUODENOSCOPY (EGD);  Surgeon: Lin Landsman, MD;  Location: [facility] ENDOSCOPY;  Service: Gastroenterology;  Laterality: N/A;   HERNIA REPAIR     LAPAROSCOPIC APPENDECTOMY N/A 02/17/2018   Procedure: APPENDECTOMY LAPAROSCOPIC;  Surgeon: Benjamine Sprague, DO;  Location: [facility] ORS;  Service: General;  Laterality: N/A;   PPM GENERATOR CHANGEOUT N/A 09/14/2020   Procedure: PPM GENERATOR CHANGEOUT;  Surgeon: Isaias Cowman, MD;  Location: [place] CV LAB;  Service: Cardiovascular;  Laterality: N/A;   TEE WITHOUT CARDIOVERSION N/A 08/10/2020   Procedure: TRANSESOPHAGEAL ECHOCARDIOGRAM (TEE);  Surgeon: Dixie Dials, MD;  Location: [facility] ENDOSCOPY;  Service: Cardiovascular;  Laterality: N/A;     Social History:   reports that  she has quit smoking. Her smoking use included cigarettes. She smoked an average of .25 packs per day. She has never used smokeless tobacco. She reports that she does not drink alcohol and does not use drugs.   Family History:  Her family history includes Arthritis in her sister and sister; Depression in her daughter and son; Diabetes in her brother; Diabetes Mellitus II in her mother; Heart attack in her father; Heart disease in her brother; Hypertension in her mother; Schizophrenia in her son; Thyroid disease in her brother. There is no history of Breast cancer.   Allergies Allergies  Allergen Reactions   Levaquin [Levofloxacin] Anaphylaxis   Amoxicillin Hives    Did it involve swelling of the face/tongue/throat, SOB, or low BP? No Did it involve sudden or severe rash/hives, skin peeling, or any reaction on the inside of your mouth or nose? No Did you need to seek medical attention at a hospital or doctor's office? No When did it last happen?      10+ years If all above answers are "NO", may proceed with cephalosporin use.   Nitrofuran Derivatives Other (See Comments)    Unknown   Penicillins Other (See Comments)    Thinks it made her itch a lot, but isn't sure. Did it involve swelling of the face/tongue/throat, SOB, or low BP? No Did it involve sudden or severe rash/hives, skin peeling, or any reaction on the inside of your mouth or nose? No Did you need to seek medical attention at a hospital or doctor's office? No When did it last happen?      10+ years If all above answers are "NO", may proceed with cephalosporin use.    Zithromax [Azithromycin] Other (See Comments)   Antihistamines, Chlorpheniramine-Type Rash     Home Medications  Prior to Admission medications   Medication Sig Start Date End Date Taking? Authorizing Provider  amiodarone (PACERONE) 200 MG tablet Take 0.5 tablets (100 mg total) by mouth daily. Home med. 02/19/21  Yes Enzo Bi, MD  carvedilol (COREG) 6.25 MG  tablet Take 0.5 tablets (3.125 mg total) by mouth 2 (two) times daily. Reduced from 6.25 mg twice daily. 02/19/21  Yes Enzo Bi, MD  fluticasone (FLONASE) 50 MCG/ACT nasal spray Place 2 sprays into both nostrils daily. 07/26/18  Yes Volney American, PA-C  Fluticasone-Umeclidin-Vilant (TRELEGY ELLIPTA) 100-62.5-25 MCG/INH AEPB Inhale 1 puff into the lungs daily. 02/12/20  Yes [provider]  sucralfate (CARAFATE) 1 g tablet Take 1 tablet (1 g total) by mouth 4 (four) times daily. 02/05/2021  Yes Jon Billings, NP  torsemide (DEMADEX) 20 MG tablet Take 2 tablets (40 mg total) by mouth daily. Increased from 20 mg daily. 02/19/21 05/20/21 Yes Enzo Bi, MD  albuterol (VENTOLIN HFA) 108 (90 Base) MCG/ACT inhaler Inhale 2 puffs into the lungs every 6 (six) hours as needed for wheezing or shortness of breath. 01/18/21   Jon Billings, NP  calcitRIOL (ROCALTROL) 0.25 MCG capsule Take 0.25 mcg by mouth daily. 11/12/20   [provider]  gabapentin (NEURONTIN) 100 MG capsule Take 1 capsule (100 mg total) by mouth at bedtime. 08/13/20   Dixie Dials, MD  iron polysaccharides (NIFEREX) 150 MG capsule Take 1 capsule (150 mg total) by mouth daily. 12/16/20   Fritzi Mandes, MD  levothyroxine (SYNTHROID) 175 MCG tablet Take 1 tablet (175 mcg total) by mouth daily. 12/10/20   Renato Shin, MD  midodrine (PROAMATINE) 10 MG tablet Take 1 tablet (10 mg total) by mouth 3 (three) times daily with meals. This is an increase from 5 mg. 02/19/21 05/20/21  Enzo Bi, MD  OXYGEN Inhale 3 L into the lungs 3 (three) times daily as needed (shortness of breath or coughing).    [provider]  pantoprazole (PROTONIX) 40 MG tablet Take 1 tablet (40 mg total) by mouth daily. 01/18/21 01/18/22  Jon Billings, NP  spironolactone (ALDACTONE) 25 MG tablet Take 12.5 mg by mouth daily. 04/09/17   [provider]  vitamin B-12 1000 MCG tablet Take 1 tablet (1,000 mcg total) by mouth daily. 12/16/20    Fritzi Mandes, MD     Critical care time: 76 minutes       Donell Beers, [place] Pager [phone] (please enter 7 digits) PCCM Consult Pager [phone] (please enter 7 digits)

## 2021-02-25 NOTE — Progress Notes (Signed)
PHARMACY - PHYSICIAN COMMUNICATION CRITICAL VALUE ALERT - BLOOD CULTURE IDENTIFICATION (BCID)  BCID results: 4 of 4 bottles with Staph Aureus, no resistance. Pt is currently only on Ceftriaxone.  Name of provider contacted: B Rust-Chester, NP  Changes to prescribed antibiotics required: Transition from Ceftriaxone to Cefazolin.  Renda Rolls, PharmD, [facility] 02/25/2021 2:52 AM

## 2021-02-25 NOTE — Consult Note (Addendum)
NAME: [name]  DOB: 15-Jan-1950  MRN: [id number]  Date/Time: 02/25/2021 2:37 PM  REQUESTING PROVIDER: Dr.: Dr. Patsey Berthold Subjective:  REASON FOR CONSULT: MSSA bacteremia ?pt is a poor historian, chart reviewed completely and spoke to family at bedside Maria Neal is a 71 y.o. female with a history of chronic heart failure, chronic hypoxic respiratory failure on oxygen at home, chronic anemia with GI bleed, paroxysmal A-fib, COPD, CKD, presented to the ED on 03/01/2021 with shortness of breath.  Patient was recently hospitalized at [facility] between 02/15/2021 until 02/19/2021 for similar condition and it was due to the heart failure and frusemide was increased.  But apparently after discharge she was progressively worse with increasing shortness of breath and edema of both lower extremities and came to the ED. Vitals in the ED BP 123/102, pulse 70, respiratory 21 with sats of 91% on oxygen. WBC 8.7, Hb 9.5, platelet 121 and creatinine 2.42 She needed nonrebreather which was then transitioned to BiPAP.  She also received 60 mg IV Lasix and 2 doses and sublingual nitroglycerin.  She was admitted to the stepdown unit under hospitalist team but on 2/23 intensivist was consulted due to developing hypotension with mean arterial pressure in the 50s requiring Levophed drip.  So she was changed to ICU status.  She was started on ceftriaxone on 02/24/2021 as the nurse practitioner noted cellulitis on the left arm at the site of previous IV.Marland Kitchen  As the blood culture came back positive for Staphylococcus aureus the ceftriaxone was changed to cefazolin and IM seeing the patient now Past Medical History:  Diagnosis Date   Acute appendicitis 02/17/2018   AICD (automatic cardioverter/defibrillator) present    Anxiety state    Arrhythmia    atrial fibrillation   CAD (coronary artery disease)    Cardiomyopathy (HCC)    CHF (congestive heart failure) (HCC)    COPD (chronic obstructive pulmonary disease) ([place])    Dyspnea     Headache    History of kidney stones    History of shingles    Hypertension    Influenza A 11/04/2020   On home oxygen therapy    bedtime and prn   Restless leg syndrome     Past Surgical History:  Procedure Laterality Date   CARDIAC CATHETERIZATION  04/2014   Dr. Idelle Leech   CARDIAC CATHETERIZATION     CARDIAC DEFIBRILLATOR PLACEMENT     CARDIOVERSION N/A 08/11/2020   Procedure: CARDIOVERSION;  Surgeon: Dixie Dials, MD;  Location: [place];  Service: Cardiovascular;  Laterality: N/A;   CHOLECYSTECTOMY N/A 08/23/2016   Procedure: LAPAROSCOPIC CHOLECYSTECTOMY;  Surgeon: Clayburn Pert, MD;  Location: [facility] ORS;  Service: General;  Laterality: N/A;   COLONOSCOPY WITH PROPOFOL N/A 12/15/2020   Procedure: COLONOSCOPY WITH PROPOFOL;  Surgeon: Lin Landsman, MD;  Location: [place];  Service: Gastroenterology;  Laterality: N/A;   COLONOSCOPY WITH PROPOFOL N/A 12/16/2020   Procedure: COLONOSCOPY WITH PROPOFOL;  Surgeon: Lucilla Lame, MD;  Location: [facility] ENDOSCOPY;  Service: Endoscopy;  Laterality: N/A;   DILATATION & CURETTAGE/HYSTEROSCOPY WITH MYOSURE N/A 06/27/2018   Procedure: DILATATION & CURETTAGE/HYSTEROSCOPY WITH MYOSURE;  Surgeon: Malachy Mood, MD;  Location: [facility] ORS;  Service: Gynecology;  Laterality: N/A;   ESOPHAGOGASTRODUODENOSCOPY N/A 12/15/2020   Procedure: ESOPHAGOGASTRODUODENOSCOPY (EGD);  Surgeon: Lin Landsman, MD;  Location: [facility] ENDOSCOPY;  Service: Gastroenterology;  Laterality: N/A;   HERNIA REPAIR     LAPAROSCOPIC APPENDECTOMY N/A 02/17/2018   Procedure: APPENDECTOMY LAPAROSCOPIC;  Surgeon: Benjamine Sprague, DO;  Location: [place]  ORS;  Service: General;  Laterality: N/A;   PPM GENERATOR CHANGEOUT N/A 09/14/2020   Procedure: PPM GENERATOR CHANGEOUT;  Surgeon: Isaias Cowman, MD;  Location: [place] CV LAB;  Service: Cardiovascular;  Laterality: N/A;   TEE WITHOUT CARDIOVERSION N/A 08/10/2020   Procedure: TRANSESOPHAGEAL  ECHOCARDIOGRAM (TEE);  Surgeon: Dixie Dials, MD;  Location: [facility] ENDOSCOPY;  Service: Cardiovascular;  Laterality: N/A;    Social History   Socioeconomic History   Marital status: Divorced    Spouse name: Not on file   Number of children: Not on file   Years of education: Not on file   Highest education level: High school graduate  Occupational History   Occupation: retired  Tobacco Use   Smoking status: Former    Packs/day: 0.25    Types: Cigarettes   Smokeless tobacco: Never   Tobacco comments:    quit at age 87, whole pack lasted 3 weeks   Vaping Use   Vaping Use: Never used  Substance and Sexual Activity   Alcohol use: No    Alcohol/week: 0.0 standard drinks   Drug use: No   Sexual activity: Not Currently    Birth control/protection: None  Other Topics Concern   Not on file  Social History Narrative   Not on file   Social Determinants of Health   Financial Resource Strain: Not on file  Food Insecurity: Not on file  Transportation Needs: Not on file  Physical Activity: Not on file  Stress: Not on file  Social Connections: Not on file  Intimate Partner Violence: Not on file    Family History  Problem Relation Age of Onset   Diabetes Mellitus II Mother    Hypertension Mother    Heart attack Father    Arthritis Sister    Arthritis Sister    Thyroid disease Brother    Heart disease Brother    Diabetes Brother    Depression Daughter    Depression Son    Schizophrenia Son    Breast cancer Neg Hx    Allergies  Allergen Reactions   Levaquin [Levofloxacin] Anaphylaxis   Amoxicillin Hives    Did it involve swelling of the face/tongue/throat, SOB, or low BP? No Did it involve sudden or severe rash/hives, skin peeling, or any reaction on the inside of your mouth or nose? No Did you need to seek medical attention at a hospital or doctor's office? No When did it last happen?      10+ years If all above answers are "NO", may proceed with cephalosporin use.    Nitrofuran Derivatives Other (See Comments)    Unknown   Penicillins Other (See Comments)    Thinks it made her itch a lot, but isn't sure. Did it involve swelling of the face/tongue/throat, SOB, or low BP? No Did it involve sudden or severe rash/hives, skin peeling, or any reaction on the inside of your mouth or nose? No Did you need to seek medical attention at a hospital or doctor's office? No When did it last happen?      10+ years If all above answers are "NO", may proceed with cephalosporin use.    Zithromax [Azithromycin] Other (See Comments)   Antihistamines, Chlorpheniramine-Type Rash   I? Current Facility-Administered Medications  Medication Dose Route Frequency Provider Last Rate Last Admin   0.9 %  sodium chloride infusion  250 mL Intravenous Continuous Teressa Lower, NP   Held at 02/24/21 1446   acetaminophen (TYLENOL) tablet 650 mg  650 mg Oral  Q6H PRN Howerter, Justin B, DO   650 mg at 02/24/21 1246   Or   acetaminophen (TYLENOL) suppository 650 mg  650 mg Rectal Q6H PRN Howerter, Justin B, DO       albuterol (PROVENTIL) (2.5 MG/3ML) 0.083% nebulizer solution 2.5 mg  2.5 mg Nebulization Q4H PRN Howerter, Justin B, DO       amiodarone (PACERONE) tablet 100 mg  100 mg Oral Daily Howerter, Justin B, DO   100 mg at 02/25/21 0958   ceFAZolin (ANCEF) IVPB 2g/100 mL premix  2 g Intravenous Q12H Renda Rolls, RPH   Stopped at 02/25/21 0344   Chlorhexidine Gluconate Cloth 2 % PADS 6 each  6 each Topical Daily Howerter, Justin B, DO   6 each at 02/24/21 1741   feeding supplement (ENSURE ENLIVE / ENSURE PLUS) liquid 237 mL  237 mL Oral BID BM Tyler Pita, MD   237 mL at 02/25/21 1355   fluticasone (FLONASE) 50 MCG/ACT nasal spray 2 spray  2 spray Each Nare Daily PRN Howerter, Justin B, DO       fluticasone furoate-vilanterol (BREO ELLIPTA) 100-25 MCG/ACT 1 puff  1 puff Inhalation Daily Howerter, Justin B, DO   1 puff at 02/25/21 0959   And   umeclidinium bromide (INCRUSE  ELLIPTA) 62.5 MCG/ACT 1 puff  1 puff Inhalation Daily Howerter, Justin B, DO   1 puff at 02/25/21 0959   heparin injection 5,000 Units  5,000 Units Subcutaneous Q12H Teressa Lower, NP       iron polysaccharides (NIFEREX) capsule 150 mg  150 mg Oral Daily Howerter, Justin B, DO   150 mg at 02/25/21 0958   levothyroxine (SYNTHROID) tablet 175 mcg  175 mcg Oral Q0600 Howerter, Justin B, DO   175 mcg at 02/25/21 0504   MEDLINE mouth rinse  15 mL Mouth Rinse BID Rust-Chester, Britton L, NP   15 mL at 02/25/21 0959   midodrine (PROAMATINE) tablet 10 mg  10 mg Oral TID WC Shelly Coss, MD   10 mg at 02/25/21 1140   milrinone (PRIMACOR) 20 MG/100 ML (0.2 mg/mL) infusion  0.125 mcg/kg/min Intravenous Continuous Teressa Lower, NP       [START ON 02/26/2021] multivitamin with minerals tablet 1 tablet  1 tablet Oral Daily Tyler Pita, MD       norepinephrine (LEVOPHED) 4mg  in 264mL (0.016 mg/mL) premix infusion  2-10 mcg/min Intravenous Titrated Teressa Lower, NP 22.5 mL/hr at 02/25/21 1354 6 mcg/min at 02/25/21 1354   pantoprazole (PROTONIX) EC tablet 40 mg  40 mg Oral Daily Howerter, Justin B, DO   40 mg at 02/25/21 3244     Abtx:  Anti-infectives (From admission, onward)    Start     Dose/Rate Route Frequency Ordered Stop   02/25/21 0400  ceFAZolin (ANCEF) IVPB 2g/100 mL premix        2 g 200 mL/hr over 30 Minutes Intravenous Every 12 hours 02/25/21 0250     02/24/21 1700  cefTRIAXone (ROCEPHIN) 1 g in sodium chloride 0.9 % 100 mL IVPB  Status:  Discontinued        1 g 200 mL/hr over 30 Minutes Intravenous Every 24 hours 02/24/21 1650 02/25/21 0250       REVIEW OF SYSTEMS: Limited review of system because patient is very weak. Const: negative fever, negative chills,  Eyes: negative diplopia or visual changes, negative eye pain ENT: negative coryza, negative sore throat Resp: Has shortness of breath Cards:  Chest tightness and peripheral edema being GU: negative for frequency, dysuria  and hematuria GI: Negative for abdominal pain, diarrhea, bleeding, constipation Skin: negative for rash and pruritus Heme: negative for easy bruising and gum/nose bleeding MS: Generalized weakness  Neurolo:, dizziness,   Psych: negative for feelings of anxiety, depression  Endocrine: negative for thyroid, diabetes Allergy/Immunology- as above Objective:  VITALS:  BP (!) 106/52    Pulse 69    Temp 97.8 F (36.6 C) (Oral)    Resp (!) 25    Ht 5\' 4"  (1.626 m)    Wt 77.8 kg    SpO2 98%    BMI 29.44 kg/m  PHYSICAL EXAM:  General: Awake   , no distress, pale, responds to questions but weak Head: Normocephalic, without obvious abnormality, atraumatic. Eyes: Conjunctivae clear, anicteric sclerae. Pupils are equal ENT Nares normal. No drainage or sinus tenderness. Lips, mucosa, and tongue normal. No Thrush Neck: , symmetrical, no adenopathy, thyroid: non tender no carotid bruit and no JVD. Lungs: b/l air entry- crepts in the bases Heart:irregular, systolic murmur Abdomen: Soft, non-tender,not distended. Bowel sounds normal. No masses Extremities: erythema and some tenderness over hte left fore arm Bruising over rt arm  Thin skin Edema legs Skin: No rashes or lesions. Or bruising Lymph: Cervical, supraclavicular normal. Neurologic: Grossly non-focal Pertinent Labs Lab Results CBC    Component Value Date/Time   WBC 12.0 (H) 02/25/2021 0424   RBC 3.87 02/25/2021 0424   HGB 9.8 (L) 02/25/2021 0424   HGB 8.9 (L) 01/18/2021 1122   HCT 34.3 (L) 02/25/2021 0424   HCT 30.4 (L) 01/18/2021 1122   PLT 107 (L) 02/25/2021 0424   PLT 195 01/18/2021 1122   MCV 88.6 02/25/2021 0424   MCV 88 01/18/2021 1122   MCV 91 12/07/2013 1207   MCH 25.3 (L) 02/25/2021 0424   MCHC 28.6 (L) 02/25/2021 0424   RDW 21.4 (H) 02/25/2021 0424   RDW 17.4 (H) 01/18/2021 1122   RDW 14.6 (H) 12/07/2013 1207   LYMPHSABS 0.8 02/25/2021 0424   LYMPHSABS 1.4 01/18/2021 1122   LYMPHSABS 1.0 02/23/2013 0506    MONOABS 0.5 02/25/2021 0424   MONOABS 0.1 (L) 02/23/2013 0506   EOSABS 0.0 02/25/2021 0424   EOSABS 0.2 01/18/2021 1122   EOSABS 0.0 02/23/2013 0506   BASOSABS 0.0 02/25/2021 0424   BASOSABS 0.1 01/18/2021 1122   BASOSABS 0.0 02/23/2013 0506    CMP Latest Ref Rng & Units 02/25/2021 02/24/2021 02/24/2021  Glucose 70 - 99 mg/dL 145(H) - 103(H)  BUN 8 - 23 mg/dL 71(H) - 63(H)  Creatinine 0.44 - 1.00 mg/dL 2.45(H) - 2.24(H)  Sodium 135 - 145 mmol/L 137 - 139  Potassium 3.5 - 5.1 mmol/L 4.4 4.3 3.4(L)  Chloride 98 - 111 mmol/L 91(L) - 98  CO2 22 - 32 mmol/L 32 - 30  Calcium 8.9 - 10.3 mg/dL 8.6(L) - 8.0(L)  Total Protein 6.5 - 8.1 g/dL - - 5.3(L)  Total Bilirubin 0.3 - 1.2 mg/dL - - 2.2(H)  Alkaline Phos 38 - 126 U/L - - 67  AST 15 - 41 U/L - - 23  ALT 0 - 44 U/L - - 21      Microbiology: Recent Results (from the past 240 hour(s))  MRSA Next Gen by PCR, Nasal     Status: None   Collection Time: 02/16/21  7:00 AM   Specimen: Nasal Mucosa; Nasal Swab  Result Value Ref Range Status   MRSA by PCR Next Gen NOT DETECTED NOT  DETECTED Final    Comment: (NOTE) The GeneXpert MRSA Assay (FDA approved for NASAL specimens only), is one component of a comprehensive MRSA colonization surveillance program. It is not intended to diagnose MRSA infection nor to guide or monitor treatment for MRSA infections. Test performance is not FDA approved in patients less than 69 years old. Performed at [facility], [address] [place], [place] [address]   Resp Panel by RT-PCR (Flu A&B, Covid) Nasopharyngeal Swab     Status: None   Collection Time: 02/03/2021 10:02 PM   Specimen: Nasopharyngeal Swab; Nasopharyngeal(NP) swabs in vial transport medium  Result Value Ref Range Status   SARS Coronavirus 2 by RT PCR NEGATIVE NEGATIVE Final    Comment: (NOTE) SARS-CoV-2 target nucleic acids are NOT DETECTED.  The SARS-CoV-2 RNA is generally detectable in upper respiratory specimens during the  acute phase of infection. The lowest concentration of SARS-CoV-2 viral copies this assay can detect is 138 copies/mL. A negative result does not preclude SARS-Cov-2 infection and should not be used as the sole basis for treatment or other patient management decisions. A negative result may occur with  improper specimen collection/handling, submission of specimen other than nasopharyngeal swab, presence of viral mutation(s) within the areas targeted by this assay, and inadequate number of viral copies(<138 copies/mL). A negative result must be combined with clinical observations, patient history, and epidemiological information. The expected result is Negative.  Fact Sheet for Patients:  EntrepreneurPulse.com.au  Fact Sheet for Healthcare Providers:  IncredibleEmployment.be  This test is no t yet approved or cleared by the [place] FDA and  has been authorized for detection and/or diagnosis of SARS-CoV-2 by FDA under an Emergency Use Authorization (EUA). This EUA will remain  in effect (meaning this test can be used) for the duration of the COVID-19 declaration under Section 564(b)(1) of the Act, 21 U.S.C.section 360bbb-3(b)(1), unless the authorization is terminated  or revoked sooner.       Influenza A by PCR NEGATIVE NEGATIVE Final   Influenza B by PCR NEGATIVE NEGATIVE Final    Comment: (NOTE) The Xpert Xpress SARS-CoV-2/FLU/RSV plus assay is intended as an aid in the diagnosis of influenza from Nasopharyngeal swab specimens and should not be used as a sole basis for treatment. Nasal washings and aspirates are unacceptable for Xpert Xpress SARS-CoV-2/FLU/RSV testing.  Fact Sheet for Patients: EntrepreneurPulse.com.au  Fact Sheet for Healthcare Providers: IncredibleEmployment.be  This test is not yet approved or cleared by the [place] FDA and has been authorized for detection and/or  diagnosis of SARS-CoV-2 by FDA under an Emergency Use Authorization (EUA). This EUA will remain in effect (meaning this test can be used) for the duration of the COVID-19 declaration under Section 564(b)(1) of the Act, 21 U.S.C. section 360bbb-3(b)(1), unless the authorization is terminated or revoked.  Performed at [facility], [address] [place], [place] [address]   CULTURE, BLOOD (ROUTINE X 2) w Reflex to ID Panel     Status: None (Preliminary result)   Collection Time: 02/24/21  3:09 PM   Specimen: BLOOD  Result Value Ref Range Status   Specimen Description BLOOD [facility]  Final   Special Requests BOTTLES DRAWN AEROBIC AND ANAEROBIC BCAV  Final   Culture  Setup Time   Final    Organism ID to follow GRAM POSITIVE COCCI ANAEROBIC BOTTLE ONLY CRITICAL RESULT CALLED TO, READ BACK BY AND VERIFIED WITH: NATHAN BELUE @0232  ON 02/25/21 SKL Performed at [facility], [address] [place],  [place] [address]    Culture GRAM POSITIVE COCCI  Final   Report Status PENDING  Incomplete  CULTURE, BLOOD (ROUTINE X 2) w Reflex to ID Panel     Status: None (Preliminary result)   Collection Time: 02/24/21  3:09 PM   Specimen: BLOOD  Result Value Ref Range Status   Specimen Description BLOOD RFOA  Final   Special Requests BOTTLES DRAWN AEROBIC AND ANAEROBIC BCAV  Final   Culture  Setup Time   Final    GRAM POSITIVE COCCI IN BOTH AEROBIC AND ANAEROBIC BOTTLES CRITICAL RESULT CALLED TO, READ BACK BY AND VERIFIED WITH: NATHAN BELUE @0232  ON 02/25/21 SKL Performed at [facility], [address] [place], [place] [address]    Culture GRAM POSITIVE COCCI  Final   Report Status PENDING  Incomplete  Blood Culture ID Panel (Reflexed)     Status: Abnormal   Collection Time: 02/24/21  3:09 PM  Result Value Ref Range Status   Enterococcus faecalis NOT DETECTED NOT DETECTED Final   Enterococcus Faecium NOT DETECTED NOT DETECTED Final   Listeria monocytogenes NOT DETECTED  NOT DETECTED Final   Staphylococcus species DETECTED (A) NOT DETECTED Final    Comment: CRITICAL RESULT CALLED TO, READ BACK BY AND VERIFIED WITH: NATHAN BELUE @0232  ON 02/25/21 SKL    Staphylococcus aureus (BCID) DETECTED (A) NOT DETECTED Final    Comment: CRITICAL RESULT CALLED TO, READ BACK BY AND VERIFIED WITH: NATHAN BELUE @0232  ON 02/25/21 SKL    Staphylococcus epidermidis NOT DETECTED NOT DETECTED Final   Staphylococcus lugdunensis NOT DETECTED NOT DETECTED Final   Streptococcus species NOT DETECTED NOT DETECTED Final   Streptococcus agalactiae NOT DETECTED NOT DETECTED Final   Streptococcus pneumoniae NOT DETECTED NOT DETECTED Final   Streptococcus pyogenes NOT DETECTED NOT DETECTED Final   A.calcoaceticus-baumannii NOT DETECTED NOT DETECTED Final   Bacteroides fragilis NOT DETECTED NOT DETECTED Final   Enterobacterales NOT DETECTED NOT DETECTED Final   Enterobacter cloacae complex NOT DETECTED NOT DETECTED Final   Escherichia coli NOT DETECTED NOT DETECTED Final   Klebsiella aerogenes NOT DETECTED NOT DETECTED Final   Klebsiella oxytoca NOT DETECTED NOT DETECTED Final   Klebsiella pneumoniae NOT DETECTED NOT DETECTED Final   Proteus species NOT DETECTED NOT DETECTED Final   Salmonella species NOT DETECTED NOT DETECTED Final   Serratia marcescens NOT DETECTED NOT DETECTED Final   Haemophilus influenzae NOT DETECTED NOT DETECTED Final   Neisseria meningitidis NOT DETECTED NOT DETECTED Final   Pseudomonas aeruginosa NOT DETECTED NOT DETECTED Final   Stenotrophomonas maltophilia NOT DETECTED NOT DETECTED Final   Candida albicans NOT DETECTED NOT DETECTED Final   Candida auris NOT DETECTED NOT DETECTED Final   Candida glabrata NOT DETECTED NOT DETECTED Final   Candida krusei NOT DETECTED NOT DETECTED Final   Candida parapsilosis NOT DETECTED NOT DETECTED Final   Candida tropicalis NOT DETECTED NOT DETECTED Final   Cryptococcus neoformans/gattii NOT DETECTED NOT DETECTED Final    Meth resistant mecA/C and MREJ NOT DETECTED NOT DETECTED Final    Comment: Performed at [facility], [address] [place], [place] [address]    IMAGING RESULTS:  I have personally reviewed the films ? Impression/Recommendation Acute on chronic hypoxic respiratory failure on oxygen Severe CHF H/o COPD- home oxygen Severe MR Dilated Cardiomyopathy EF < 20 Has AICD  MSSA bacteremia- 3/4 bottles Likely cellulitis /phlebitis at the site of previous IV- no abscess With worsening heart failure, presence of cardiac device she is going to  need TEE to rule out endocarditis She is currently on cefazolin adjusted to her creatinine clearance.  We will repeat blood cultures to look for clearance A 2D echo was done recently on 02/15/2021 EF is less than 20%.  There was severe mitral regurgitation and the mitral valve was normal looking the.  The aortic valve is normal in structure with trivial regurg Tricuspid valve regurg was moderate.  Chronic hypotension on midodrine, also on levo  Chronic kidney disease  Chronic A-fib.  Not on anticoagulation.  On amiodarone and Coreg __________ Anemia Thrombocytopenia  Hypothyroidism on synthroid _________________________________________ Discussed with patient, family and ICU NP and nurses  On-call ID available remotely this weekend.  Call if needed From 02/28/2021 [facility] physicians will cover [facility] while I am away. Contact info in AMION Note:  This document was prepared using Dragon voice recognition software and may include unintentional dictation errors.

## 2021-02-25 NOTE — Progress Notes (Signed)
Attempted to place a midline per MD order x 3. Veins constricting and unable to thread the wire successfully. Primary RN aware and made NP aware.

## 2021-02-25 NOTE — Progress Notes (Signed)
Pharmacy Antibiotic Note  Maria Neal is a 71 y.o. female admitted on 02/24/2021 with bacteremia.  Pharmacy has been consulted for Cefazolin dosing.  Plan: Cefazolin 2 gm q12h per indication and renal fxn.  Pharmacy will continue to follow and adjusted abx dosing when warranted.  Height: 5\' 4"  (162.6 cm) Weight: 77.8 kg (171 lb 8.3 oz) IBW/kg (Calculated) : 54.7  Temp (24hrs), Avg:97.5 F (36.4 C), Min:97.5 F (36.4 C), Max:97.5 F (36.4 C)  Recent Labs  Lab 02/18/21 0534 02/19/21 0531 02/07/2021 2202 02/24/21 0527 02/24/21 1743  WBC 4.8 4.3 8.7 9.1  --   CREATININE 2.21* 2.25* 2.42* 2.24*  --   LATICACIDVEN  --   --   --   --  1.9    Estimated Creatinine Clearance: 23.6 mL/min (A) (by C-G formula based on SCr of 2.24 mg/dL (H)).    Allergies  Allergen Reactions   Levaquin [Levofloxacin] Anaphylaxis   Amoxicillin Hives    Did it involve swelling of the face/tongue/throat, SOB, or low BP? No Did it involve sudden or severe rash/hives, skin peeling, or any reaction on the inside of your mouth or nose? No Did you need to seek medical attention at a hospital or doctor's office? No When did it last happen?      10+ years If all above answers are "NO", may proceed with cephalosporin use.   Nitrofuran Derivatives Other (See Comments)    Unknown   Penicillins Other (See Comments)    Thinks it made her itch a lot, but isn't sure. Did it involve swelling of the face/tongue/throat, SOB, or low BP? No Did it involve sudden or severe rash/hives, skin peeling, or any reaction on the inside of your mouth or nose? No Did you need to seek medical attention at a hospital or doctor's office? No When did it last happen?      10+ years If all above answers are "NO", may proceed with cephalosporin use.    Zithromax [Azithromycin] Other (See Comments)   Antihistamines, Chlorpheniramine-Type Rash    Antimicrobials this admission: 2/23 Ceftriaxone >> 2/24 2/24 Cefazolin >>   Microbiology  results: 2/23 BCx: 4 of 4 bottles: Staph Aureus, no resistance  Thank you for allowing pharmacy to be a part of this patients care.  Renda Rolls, PharmD, [facility] 02/25/2021 2:54 AM

## 2021-02-25 NOTE — Consult Note (Signed)
PHARMACY CONSULT NOTE  Pharmacy Consult for Electrolyte Monitoring and Replacement   Recent Labs: Potassium (mmol/L)  Date Value  02/25/2021 4.4  12/07/2013 3.8   Magnesium (mg/dL)  Date Value  02/25/2021 2.1   Calcium (mg/dL)  Date Value  02/25/2021 8.6 (L)   Calcium, Total (mg/dL)  Date Value  12/07/2013 8.7   Albumin (g/dL)  Date Value  02/24/2021 2.4 (L)  01/31/2021 3.5 (L)  02/22/2013 3.4   Phosphorus (mg/dL)  Date Value  02/25/2021 3.5   Sodium (mmol/L)  Date Value  02/25/2021 137  01/31/2021 141  12/07/2013 139   Assessment: Patient is a 71 y/o F with medical history including cardiomyopathy / systolic CHF with EF < 11% s/p AICD, Afib, anxiety, COPD on 3L O2 at baseline who is admitted with Staphylococcus aureus bacteremia and acute on chronic CHF. Pharmacy consulted to assist with electrolyte monitoring and replacement as indicated.  Patient is requiring Levophed and milrinone for cardiovascular support  Goal of Therapy:  K > = 4 Mg > = 2 All other electrolytes within normal limits  Plan:  --No electrolyte replacement indicated at this time --Will follow-up electrolytes with AM labs tomorrow  Benita Gutter 02/25/2021 2:33 PM

## 2021-02-26 DIAGNOSIS — I5023 Acute on chronic systolic (congestive) heart failure: Secondary | ICD-10-CM | POA: Diagnosis not present

## 2021-02-26 DIAGNOSIS — L8990 Pressure ulcer of unspecified site, unspecified stage: Secondary | ICD-10-CM | POA: Diagnosis present

## 2021-02-26 LAB — CBC WITH DIFFERENTIAL/PLATELET
Abs Immature Granulocytes: 0.09 10*3/uL — ABNORMAL HIGH (ref 0.00–0.07)
Basophils Absolute: 0.1 10*3/uL (ref 0.0–0.1)
Basophils Relative: 1 %
Eosinophils Absolute: 0.1 10*3/uL (ref 0.0–0.5)
Eosinophils Relative: 1 %
HCT: 31.3 % — ABNORMAL LOW (ref 36.0–46.0)
Hemoglobin: 9.1 g/dL — ABNORMAL LOW (ref 12.0–15.0)
Immature Granulocytes: 1 %
Lymphocytes Relative: 10 %
Lymphs Abs: 1.1 10*3/uL (ref 0.7–4.0)
MCH: 24.9 pg — ABNORMAL LOW (ref 26.0–34.0)
MCHC: 29.1 g/dL — ABNORMAL LOW (ref 30.0–36.0)
MCV: 85.5 fL (ref 80.0–100.0)
Monocytes Absolute: 0.5 10*3/uL (ref 0.1–1.0)
Monocytes Relative: 5 %
Neutro Abs: 9.2 10*3/uL — ABNORMAL HIGH (ref 1.7–7.7)
Neutrophils Relative %: 82 %
Platelets: 101 10*3/uL — ABNORMAL LOW (ref 150–400)
RBC: 3.66 MIL/uL — ABNORMAL LOW (ref 3.87–5.11)
RDW: 20.5 % — ABNORMAL HIGH (ref 11.5–15.5)
WBC: 11 10*3/uL — ABNORMAL HIGH (ref 4.0–10.5)
nRBC: 0.8 % — ABNORMAL HIGH (ref 0.0–0.2)

## 2021-02-26 LAB — BASIC METABOLIC PANEL
Anion gap: 10 (ref 5–15)
BUN: 76 mg/dL — ABNORMAL HIGH (ref 8–23)
CO2: 31 mmol/L (ref 22–32)
Calcium: 8 mg/dL — ABNORMAL LOW (ref 8.9–10.3)
Chloride: 91 mmol/L — ABNORMAL LOW (ref 98–111)
Creatinine, Ser: 2.29 mg/dL — ABNORMAL HIGH (ref 0.44–1.00)
GFR, Estimated: 22 mL/min — ABNORMAL LOW (ref >60)
Glucose, Bld: 114 mg/dL — ABNORMAL HIGH (ref 70–99)
Potassium: 4 mmol/L (ref 3.5–5.1)
Sodium: 132 mmol/L — ABNORMAL LOW (ref 135–145)

## 2021-02-26 LAB — MAGNESIUM: Magnesium: 2 mg/dL (ref 1.7–2.4)

## 2021-02-26 LAB — PHOSPHORUS: Phosphorus: 2.8 mg/dL (ref 2.5–4.6)

## 2021-02-26 MED ORDER — TRAZODONE HCL 50 MG PO TABS
50.0000 mg | ORAL_TABLET | Freq: Every day | ORAL | Status: DC
  Administered 2021-02-26: 50 mg via ORAL
  Filled 2021-02-26: qty 1

## 2021-02-26 NOTE — Consult Note (Signed)
PHARMACY CONSULT NOTE  Pharmacy Consult for Electrolyte Monitoring and Replacement   Recent Labs: Potassium (mmol/L)  Date Value  02/26/2021 4.0  12/07/2013 3.8   Magnesium (mg/dL)  Date Value  02/26/2021 2.0   Calcium (mg/dL)  Date Value  02/26/2021 8.0 (L)   Calcium, Total (mg/dL)  Date Value  12/07/2013 8.7   Albumin (g/dL)  Date Value  02/24/2021 2.4 (L)  01/31/2021 3.5 (L)  02/22/2013 3.4   Phosphorus (mg/dL)  Date Value  02/26/2021 2.8   Sodium (mmol/L)  Date Value  02/26/2021 132 (L)  01/31/2021 141  12/07/2013 139   Assessment: Patient is a 71 y/o F with medical history including cardiomyopathy / systolic CHF with EF < 72% s/p AICD, Afib, anxiety, COPD on 3L O2 at baseline who is admitted with Staphylococcus aureus bacteremia and acute on chronic CHF. Pharmacy consulted to assist with electrolyte monitoring and replacement as indicated.  Patient is requiring Levophed and milrinone for cardiovascular support  Goal of Therapy:  K > = 4 Mg > = 2 All other electrolytes within normal limits  Plan:  --No electrolyte replacement indicated at this time --Will follow-up electrolytes with AM labs tomorrow  Disa Riedlinger A 02/26/2021 10:15 AM

## 2021-02-26 NOTE — Progress Notes (Signed)
NAME:  Maria Neal, MRN:  [id number], DOB:  March 31, 1950, LOS: 3 ADMISSION DATE:  02/28/2021, CONSULTATION DATE: 02/24/2021 REFERRING MD: Dr. Tawanna Solo, CHIEF COMPLAINT: Hypotension    History of Present Illness:  This is a 71 yo female who presented to [facility] ER on 02/22 from home via EMS with a 1-2 day hx of worsening shortness of breath and hypoxia with O2 sats in the 80's (wears 3L O2 at baseline).  She was recently hospitalized from 02/14~02/18 following treatment of acute on chronic combined CHF exacerbation pt discharged with increased dose of torsemide 40 mg daily.    ED course Upon arrival to the ER lab results revealed chloride 91, glucose 100, BUN 67, creatinine 2.42, BNP >4,500, troponin 131, hgb 9.5, platelets 121, PT 17.8/INR 1.5, and UA with trace leukocytes and rare bacteria.  CXR revealed stable cardiomegaly and vascular engorgement without overt edema.  COVID-19/Influenza PCR A&B negative.  Per ER notes pt noted to have rales and 3+ bilateral lower extremity pitting edema.  Pt also hypoxic initially requiring NRB, then transitioned to Bipap.  She also received 60 mg iv lasix x2 doses and sublingual nitroglycerin x1 dose.  Pt admitted to the stepdown unit per hospitalist team, but remained in the ER pending bed availability.    PCCM team consulted 02/23 due to pt developing hypotension map in the 50's requiring levophed gtt.  Pts status changed to ICU.   Pertinent  Medical History  Acute Appendicitis  AICD Anxiety  Atrial Fibrillation  CAD  Cardiomyopathy  COPD on chronic O2 @3L   Chronic Diastolic Systolic CHF~EF <63% via Echo 02/15/2021 Headache Kidney Stones  HTN Influenza A  Restless Leg Syndrome   Micro Data:  02/10/2021: SARS-CoV-2 & Influenza PCR>>negative 02/24/21: Blood cultures>> STAPHYLOCOCCUS AUREUS  02/26/21: Repeat blood cultures>>  Antimicrobials:  Ceftriaxone 2/23>>2/24 Cefazolin 2/24>>  Significant Hospital Events: Including procedures, antibiotic start  and stop dates in addition to other pertinent events   02/22: Pt admitted to the stepdown unit with acute on chronic respiratory failure secondary to CHF exacerbation requiring intermittent Bipap remained in the ER pending bed availability  02/23: PCCM team consulted pt developed hypotension requiring levophed gtt and transfer to ICU 02/23: CT Forearm Left revealed subcutaneous soft tissue swelling/edema possibly reflecting cellulitis. No discrete fluid collection to suggest abscess. No findings suspicious for septic arthritis osteomyelitis. Severe diffuse left-sided body wall edema 02/24: Weaning levophed gtt and HFNC; Blood cultures positive for staph aureus 4/4 bottles abx changed from ceftriaxone to cefazolin. ID consulted 2/25: Remains on Levophed (currently at 8 mcg), and Milrinone infusions. Creatinine slightly improved, UOP acceptable.  Repeat blood cultures pending  Interim History / Subjective:  -No significant events noted overnight -Pt more awake and alert than previously noted (oriented to self and place) -No progression of left arm cellulitis from previous markings, pt denies pain or itchiness currently at site ~ CT left forearm without abscess, septic joint, or osteomyelitis -Repeat blood cultures drawn earlier this morning -Remains on Levophed (8 mcg) and Milrinone infusions -Creatinine slightly improved to 2.29 (2.45), UOP 750 cc last 24 hrs (net + 800 cc since admit) -Leukocytosis slowly improving to 11 (12) -Thrombocytopenia stable at 101 K (107), no bleeding noted  Objective   Blood pressure (!) 98/39, pulse 69, temperature (!) 97.5 F (36.4 C), temperature source Oral, resp. rate (!) 23, height 5\' 4"  (1.626 m), weight 78 kg, SpO2 (!) 88 %.        Intake/Output Summary (Last 24 hours) at 02/26/2021  3016 Last data filed at 02/26/2021 0600 Gross per 24 hour  Intake 1161.29 ml  Output 750 ml  Net 411.29 ml    Filed Weights   02/18/2021 2158 02/25/21 0218 02/26/21 0500   Weight: 81 kg 77.8 kg 78 kg    Examination: General: acute on chronically ill appearing female, asleep in bed, NAD on 8L HFNC  HENT: supple, kyphosis  Lungs: diminished throughout, even, non labored  Cardiovascular: ventricular paced rhythm, no R/G, 2+ radial/1+ distal pulses present, 1+ bilateral lower extremity edema; 2+ LUE edema  Abdomen: + BS x4, obese, soft, non tender, non distended  Extremities: normal bulk, moves all extremities  Neuro: Awake and alert, oriented to person and place, follows commands, no focal deficits, speech clear, PERRL Skin: left wrist erythematous and warm due to cellulitis, scattered ecchymosis   GU: voiding via [facility] Problem list   Hypokalemia   Assessment & Plan:   Acute on chronic hypoxic respiratory failure secondary to acute CHF exacerbation also concerning for possible PE  Hx: COPD and Chronic Home O2 @3L   -Supplemental O2 or prn Bipap for dyspnea and/or hypoxia -Maintain O2 sats 88% to 92% -Continue scheduled and prn bronchodilator therapy  -Unable to obtain CTA Chest due to CKD; Bilateral LE venous US negative for DVT (did show nonocclusive superficial thrombophlebitis in upper thigh segment of right great saphenous vein) -May need to consider IVC filter placement  Multifactorial Shock: Cardiogenic + Septic Acute on chronic diastolic systolic CHF with severe mitral valve regurgitation ~EF <20% via Echo 02/15/2021 Nonischemic dilated cardiomyopathy  Elevated troponin secondary to demand ischemia vs. NSTEMI~troponin's peaked at 2,480 Hx: AICD, Paroxysmal Atrial Fibrillation (unable to anticoagulate due to GI bleed secondary to eliquis), Chronic Hypotension   -Continuous cardiac monitoring -Maintain MAP >60 -Vasopressors as needed to maintain MAP goal ~ currently on Levophed at 8 mcg -Continue Milrinone gtt -Continue Midodrine -Lactic acid normalized -Hold diuretic and beta blocker therapy for now due to hypotension;  continue outpatient amiodarone -Cardiology following, appreciate input   Severe Sepsis due to LUE cellulitis~Staph Aureus Bacteremia (blood cultures positive for staph aureus in 4/4 bottles 02/24)  -Monitor fever curve -Trend WBC's & Procalcitonin -Follow cultures as above -Continue empiric Cefazolin pending cultures & sensitivities -ID is following, appreciate input, will defer further ABX to ID -CT left forearm on 2/24 is concerning for cellulitis, but no findings to suggest abscess, septic arthritis, or osteomyelitis  CKD stage IV Concern for possible Cardiorenal Syndrome -Monitor I&O's / urinary output -Follow BMP -Ensure adequate renal perfusion -Avoid nephrotoxic agents as able -Replace electrolytes as indicated -Continue Inotropes  Chronic normocytic anemia  Thrombocytopenia Hx: GI bleed  -Trend CBC  -Monitor for s/sx of bleeding and transfuse for hgb <7 -Transfuse platelets for platelet count <50 K with active bleeding -Avoid chemical VTE px  -Smear review with normal platelets  Hypothyroidism -Continue synthroid -TSH is normal at 1.45, free T4 1.94  GERD  -Continue PPI   Acute toxic encephalopathy secondary to sepsis  -Provide supportive care -Frequent reorientation, promote normal sleep/wake cycle, family presence promoted -Avoid sedating medications  Best Practice (right click and "Reselect all SmartList Selections" daily)   Diet/type: Regular consistency (see orders) DVT prophylaxis: SCD GI prophylaxis: PPI Lines: N/A Foley:  N/A Code Status:  full code Last date of multidisciplinary goals of care discussion [02/26/2021]  Care management consult placed for SNF placement at discharge per pts daughter request.  Will consult PT/OT once pt off vasopressors.  Labs   CBC: Recent Labs  Lab 02/19/2021 2202 02/24/21 0527 02/25/21 0424 02/26/21 0358  WBC 8.7 9.1 12.0* 11.0*  NEUTROABS  --  7.8* 10.7* 9.2*  HGB 9.5* 8.8* 9.8* 9.1*  HCT 33.5* 30.6*  34.3* 31.3*  MCV 87.9 88.2 88.6 85.5  PLT 121* 94* 107* 101*     Basic Metabolic Panel: Recent Labs  Lab 02/03/2021 2202 02/24/21 0527 02/24/21 1952 02/25/21 0424 02/26/21 0358  NA 136 139  --  137 132*  K 4.1 3.4* 4.3 4.4 4.0  CL 91* 98  --  91* 91*  CO2 31 30  --  32 31  GLUCOSE 100* 103*  --  145* 114*  BUN 67* 63*  --  71* 76*  CREATININE 2.42* 2.24*  --  2.45* 2.29*  CALCIUM 8.9 8.0*  --  8.6* 8.0*  MG 1.8 1.6* 1.9 2.1 2.0  PHOS  --  3.0  --  3.5 2.8    GFR: Estimated Creatinine Clearance: 23.1 mL/min (A) (by C-G formula based on SCr of 2.29 mg/dL (H)). Recent Labs  Lab 02/06/2021 2202 02/24/21 0527 02/24/21 1743 02/25/21 0424 02/26/21 0358  PROCALCITON  --  2.20  --   --   --   WBC 8.7 9.1  --  12.0* 11.0*  LATICACIDVEN  --   --  1.9  --   --      Liver Function Tests: Recent Labs  Lab 02/24/21 0527  AST 23  ALT 21  ALKPHOS 67  BILITOT 2.2*  PROT 5.3*  ALBUMIN 2.4*    No results for input(s): LIPASE, AMYLASE in the last 168 hours. No results for input(s): AMMONIA in the last 168 hours.  ABG    Component Value Date/Time   PHART 7.39 02/25/2021 0451   PCO2ART 59 (H) 02/25/2021 0451   PO2ART 57 (L) 02/25/2021 0451   HCO3 35.7 (H) 02/25/2021 0451   TCO2 29 08/07/2020 1302   O2SAT 89.8 02/25/2021 0451      Coagulation Profile: Recent Labs  Lab 02/10/2021 2202  INR 1.5*     Cardiac Enzymes: No results for input(s): CKTOTAL, CKMB, CKMBINDEX, TROPONINI in the last 168 hours.  HbA1C: Hgb A1c MFr Bld  Date/Time Value Ref Range Status  09/23/2020 11:07 AM 5.8 (H) 4.8 - 5.6 % Final    Comment:             Prediabetes: 5.7 - 6.4          Diabetes: >6.4          Glycemic control for adults with diabetes: <7.0   03/10/2020 03:36 PM 5.6 4.8 - 5.6 % Final    Comment:             Prediabetes: 5.7 - 6.4          Diabetes: >6.4          Glycemic control for adults with diabetes: <7.0     CBG: Recent Labs  Lab 02/24/21 2010  GLUCAP 145*      Review of Systems: Positives in BOLD   Gen: Denies fever, chills, weight change, fatigue, night sweats HEENT: Denies blurred vision, double vision, hearing loss, tinnitus, sinus congestion, rhinorrhea, sore throat, neck stiffness, dysphagia PULM: shortness of breath, cough, sputum production, hemoptysis, wheezing CV: Denies chest pain, edema, orthopnea, paroxysmal nocturnal dyspnea, palpitations GI: Denies abdominal pain, nausea, vomiting, diarrhea, hematochezia, melena, constipation, change in bowel habits GU: Denies dysuria, hematuria, polyuria, oliguria, urethral discharge Endocrine: Denies hot or  cold intolerance, polyuria, polyphagia or appetite change Derm: left forearm pain/erythema, rash, dry skin, scaling or peeling skin change Heme: Denies easy bruising, bleeding, bleeding gums Neuro: Denies headache, numbness, weakness, slurred speech, loss of memory or consciousness  Past Medical History:  She,  has a past medical history of Acute appendicitis (02/17/2018), AICD (automatic cardioverter/defibrillator) present, Anxiety state, Arrhythmia, CAD (coronary artery disease), Cardiomyopathy ([place]), CHF (congestive heart failure) ([place]), COPD (chronic obstructive pulmonary disease) ([place]), Dyspnea, Headache, History of kidney stones, History of shingles, Hypertension, Influenza A (11/04/2020), On home oxygen therapy, and Restless leg syndrome.   Surgical History:   Past Surgical History:  Procedure Laterality Date   CARDIAC CATHETERIZATION  04/2014   Dr. Idelle Leech   CARDIAC CATHETERIZATION     CARDIAC DEFIBRILLATOR PLACEMENT     CARDIOVERSION N/A 08/11/2020   Procedure: CARDIOVERSION;  Surgeon: Dixie Dials, MD;  Location: [facility] ENDOSCOPY;  Service: Cardiovascular;  Laterality: N/A;   CHOLECYSTECTOMY N/A 08/23/2016   Procedure: LAPAROSCOPIC CHOLECYSTECTOMY;  Surgeon: Clayburn Pert, MD;  Location: [facility] ORS;  Service: General;  Laterality: N/A;   COLONOSCOPY WITH PROPOFOL N/A 12/15/2020    Procedure: COLONOSCOPY WITH PROPOFOL;  Surgeon: Lin Landsman, MD;  Location[facility] ENDOSCOPY;  Service: Gastroenterology;  Laterality: N/A;   COLONOSCOPY WITH PROPOFOL N/A 12/16/2020   Procedure: COLONOSCOPY WITH PROPOFOL;  Surgeon: Lucilla Lame, MD;  Location: [facility] ENDOSCOPY;  Service: Endoscopy;  Laterality: N/A;   DILATATION & CURETTAGE/HYSTEROSCOPY WITH MYOSURE N/A 06/27/2018   Procedure: DILATATION & CURETTAGE/HYSTEROSCOPY WITH MYOSURE;  Surgeon: Malachy Mood, MD;  Location: [facility] ORS;  Service: Gynecology;  Laterality: N/A;   ESOPHAGOGASTRODUODENOSCOPY N/A 12/15/2020   Procedure: ESOPHAGOGASTRODUODENOSCOPY (EGD);  Surgeon: Lin Landsman, MD;  Location: [facility] ENDOSCOPY;  Service: Gastroenterology;  Laterality: N/A;   HERNIA REPAIR     LAPAROSCOPIC APPENDECTOMY N/A 02/17/2018   Procedure: APPENDECTOMY LAPAROSCOPIC;  Surgeon: Benjamine Sprague, DO;  Location: [facility] ORS;  Service: General;  Laterality: N/A;   PPM GENERATOR CHANGEOUT N/A 09/14/2020   Procedure: PPM GENERATOR CHANGEOUT;  Surgeon: Isaias Cowman, MD;  Location: [place] CV LAB;  Service: Cardiovascular;  Laterality: N/A;   TEE WITHOUT CARDIOVERSION N/A 08/10/2020   Procedure: TRANSESOPHAGEAL ECHOCARDIOGRAM (TEE);  Surgeon: Dixie Dials, MD;  Location: [facility] ENDOSCOPY;  Service: Cardiovascular;  Laterality: N/A;     Social History:   reports that she has quit smoking. Her smoking use included cigarettes. She smoked an average of .25 packs per day. She has never used smokeless tobacco. She reports that she does not drink alcohol and does not use drugs.   Family History:  Her family history includes Arthritis in her sister and sister; Depression in her daughter and son; Diabetes in her brother; Diabetes Mellitus II in her mother; Heart attack in her father; Heart disease in her brother; Hypertension in her mother; Schizophrenia in her son; Thyroid disease in her brother. There is no history of Breast cancer.    Allergies Allergies  Allergen Reactions   Levaquin [Levofloxacin] Anaphylaxis   Amoxicillin Hives    Did it involve swelling of the face/tongue/throat, SOB, or low BP? No Did it involve sudden or severe rash/hives, skin peeling, or any reaction on the inside of your mouth or nose? No Did you need to seek medical attention at a hospital or doctor's office? No When did it last happen?      10+ years If all above answers are "NO", may proceed with cephalosporin use.   Nitrofuran Derivatives Other (See Comments)    Unknown  Penicillins Other (See Comments)    Thinks it made her itch a lot, but isn't sure. Did it involve swelling of the face/tongue/throat, SOB, or low BP? No Did it involve sudden or severe rash/hives, skin peeling, or any reaction on the inside of your mouth or nose? No Did you need to seek medical attention at a hospital or doctor's office? No When did it last happen?      10+ years If all above answers are "NO", may proceed with cephalosporin use.    Zithromax [Azithromycin] Other (See Comments)   Antihistamines, Chlorpheniramine-Type Rash     Home Medications  Prior to Admission medications   Medication Sig Start Date End Date Taking? Authorizing Provider  amiodarone (PACERONE) 200 MG tablet Take 0.5 tablets (100 mg total) by mouth daily. Home med. 02/19/21  Yes Enzo Bi, MD  carvedilol (COREG) 6.25 MG tablet Take 0.5 tablets (3.125 mg total) by mouth 2 (two) times daily. Reduced from 6.25 mg twice daily. 02/19/21  Yes Enzo Bi, MD  fluticasone (FLONASE) 50 MCG/ACT nasal spray Place 2 sprays into both nostrils daily. 07/26/18  Yes Volney American, PA-C  Fluticasone-Umeclidin-Vilant (TRELEGY ELLIPTA) 100-62.5-25 MCG/INH AEPB Inhale 1 puff into the lungs daily. 02/12/20  Yes [provider]  sucralfate (CARAFATE) 1 g tablet Take 1 tablet (1 g total) by mouth 4 (four) times daily. 02/02/2021  Yes Jon Billings, NP  torsemide (DEMADEX) 20 MG tablet Take  2 tablets (40 mg total) by mouth daily. Increased from 20 mg daily. 02/19/21 05/20/21 Yes Enzo Bi, MD  albuterol (VENTOLIN HFA) 108 (90 Base) MCG/ACT inhaler Inhale 2 puffs into the lungs every 6 (six) hours as needed for wheezing or shortness of breath. 01/18/21   Jon Billings, NP  calcitRIOL (ROCALTROL) 0.25 MCG capsule Take 0.25 mcg by mouth daily. 11/12/20   [provider]  gabapentin (NEURONTIN) 100 MG capsule Take 1 capsule (100 mg total) by mouth at bedtime. 08/13/20   Dixie Dials, MD  iron polysaccharides (NIFEREX) 150 MG capsule Take 1 capsule (150 mg total) by mouth daily. 12/16/20   Fritzi Mandes, MD  levothyroxine (SYNTHROID) 175 MCG tablet Take 1 tablet (175 mcg total) by mouth daily. 12/10/20   Renato Shin, MD  midodrine (PROAMATINE) 10 MG tablet Take 1 tablet (10 mg total) by mouth 3 (three) times daily with meals. This is an increase from 5 mg. 02/19/21 05/20/21  Enzo Bi, MD  OXYGEN Inhale 3 L into the lungs 3 (three) times daily as needed (shortness of breath or coughing).    [provider]  pantoprazole (PROTONIX) 40 MG tablet Take 1 tablet (40 mg total) by mouth daily. 01/18/21 01/18/22  Jon Billings, NP  spironolactone (ALDACTONE) 25 MG tablet Take 12.5 mg by mouth daily. 04/09/17   [provider]  vitamin B-12 1000 MCG tablet Take 1 tablet (1,000 mcg total) by mouth daily. 12/16/20   Fritzi Mandes, MD     Critical care time: 40 minutes     Darel Hong, AGACNP-BC [facility] Prefer epic messenger for cross cover needs If after hours, please call E-link

## 2021-02-26 NOTE — Progress Notes (Signed)
[facility]    SUBJECTIVE: Patient feeling somewhat better denies any significant chest pain or worsening shortness of breath energy level improving feeling somewhat     Vitals:   02/26/21 0530 02/26/21 0545 02/26/21 0600 02/26/21 0615  BP: (!) 98/35 (!) 113/46 (!) 109/42 (!) 98/39  Pulse: (!) 51 70 (!) 39 69  Resp: 20 16 19  (!) 23  Temp:      TempSrc:      SpO2: (!) 88% 92% 92% (!) 88%  Weight:      Height:         Intake/Output Summary (Last 24 hours) at 02/26/2021 1005 Last data filed at 02/26/2021 0600 Gross per 24 hour  Intake 1041.29 ml  Output 450 ml  Net 591.29 ml      PHYSICAL EXAM  General: Well developed, well nourished, in no acute distress HEENT:  Normocephalic and atramatic Neck:  No JVD.  Lungs: Clear bilaterally to auscultation and percussion. Heart: HRRR . Normal S1 and S2 without gallops or murmurs.  Abdomen: Bowel sounds are positive, abdomen soft and non-tender  Msk:  Back normal, normal gait. Normal strength and tone for age. Extremities: No clubbing, cyanosis or edema.   Neuro: Alert and oriented X 3. Psych:  Good affect, responds appropriately   LABS: Basic Metabolic Panel: Recent Labs    02/25/21 0424 02/26/21 0358  NA 137 132*  K 4.4 4.0  CL 91* 91*  CO2 32 31  GLUCOSE 145* 114*  BUN 71* 76*  CREATININE 2.45* 2.29*  CALCIUM 8.6* 8.0*  MG 2.1 2.0  PHOS 3.5 2.8   Liver Function Tests: Recent Labs    02/24/21 0527  AST 23  ALT 21  ALKPHOS 67  BILITOT 2.2*  PROT 5.3*  ALBUMIN 2.4*   No results for input(s): LIPASE, AMYLASE in the last 72 hours. CBC: Recent Labs    02/25/21 0424 02/26/21 0358  WBC 12.0* 11.0*  NEUTROABS 10.7* 9.2*  HGB 9.8* 9.1*  HCT 34.3* 31.3*  MCV 88.6 85.5  PLT 107* 101*   Cardiac Enzymes: No results for input(s): CKTOTAL, CKMB, CKMBINDEX, TROPONINI in the last 72 hours. BNP: Invalid input(s): POCBNP D-Dimer: Recent Labs    02/24/21 1509  DDIMER 3.04*   Hemoglobin A1C: No results  for input(s): HGBA1C in the last 72 hours. Fasting Lipid Panel: No results for input(s): CHOL, HDL, LDLCALC, TRIG, CHOLHDL, LDLDIRECT in the last 72 hours. Thyroid Function Tests: Recent Labs    02/25/21 0424  TSH 1.465   Anemia Panel: No results for input(s): VITAMINB12, FOLATE, FERRITIN, TIBC, IRON, RETICCTPCT in the last 72 hours.  CT FOREARM LEFT WO CONTRAST  Result Date: 02/24/2021 CLINICAL DATA:  Left forearm pain and swelling. EXAM: CT OF THE LEFT FOREARM WITHOUT CONTRAST TECHNIQUE: Multidetector CT imaging was performed according to the standard protocol. Multiplanar CT image reconstructions were also generated. RADIATION DOSE REDUCTION: This exam was performed according to the departmental dose-optimization program which includes automated exposure control, adjustment of the mA and/or kV according to patient size and/or use of iterative reconstruction technique. COMPARISON:  None. FINDINGS: Subcutaneous soft tissue swelling/edema possibly reflecting cellulitis. No discrete fluid collection to suggest abscess. No findings suspicious for septic arthritis or osteomyelitis. No obvious pyomyositis. No obvious gas in the soft tissues. Severe diffuse left-sided body wall edema also noted. IMPRESSION: 1. Subcutaneous soft tissue swelling/edema possibly reflecting cellulitis. No discrete fluid collection to suggest abscess. 2. No findings suspicious for septic arthritis osteomyelitis. 3. Severe diffuse left-sided body wall  edema. Electronically Signed   By: Marijo Sanes M.D.   On: 02/24/2021 18:54   US Venous Img Lower Bilateral (DVT)  Result Date: 02/25/2021 CLINICAL DATA:  Bilateral lower extremity edema. EXAM: BILATERAL LOWER EXTREMITY VENOUS DOPPLER ULTRASOUND TECHNIQUE: Gray-scale sonography with graded compression, as well as color Doppler and duplex ultrasound were performed to evaluate the lower extremity deep venous systems from the level of the common femoral vein and including the  common femoral, femoral, profunda femoral, popliteal and calf veins including the posterior tibial, peroneal and gastrocnemius veins when visible. The superficial great saphenous vein was also interrogated. Spectral Doppler was utilized to evaluate flow at rest and with distal augmentation maneuvers in the common femoral, femoral and popliteal veins. COMPARISON:  None. FINDINGS: RIGHT LOWER EXTREMITY Common Femoral Vein: No evidence of thrombus. Normal compressibility, respiratory phasicity and response to augmentation. Saphenofemoral Junction: No evidence of thrombus. Normal compressibility and flow on color Doppler imaging. Profunda Femoral Vein: No evidence of thrombus. Normal compressibility and flow on color Doppler imaging. Femoral Vein: No evidence of thrombus. Normal compressibility, respiratory phasicity and response to augmentation. Popliteal Vein: No evidence of thrombus. Normal compressibility, respiratory phasicity and response to augmentation. Calf Veins: No evidence of thrombus. Normal compressibility and flow on color Doppler imaging. Superficial Great Saphenous Vein: Suggestion of a mild amount of nonocclusive mural thrombus in the proximal/upper thigh segment of the right GSV. There is clearly flow through this segment and this is not causing significant luminal narrowing. Venous Reflux:  None. Other Findings:  No abnormal fluid collection. LEFT LOWER EXTREMITY Common Femoral Vein: No evidence of thrombus. Normal compressibility, respiratory phasicity and response to augmentation. Saphenofemoral Junction: No evidence of thrombus. Normal compressibility and flow on color Doppler imaging. Profunda Femoral Vein: No evidence of thrombus. Normal compressibility and flow on color Doppler imaging. Femoral Vein: No evidence of thrombus. Normal compressibility, respiratory phasicity and response to augmentation. Popliteal Vein: No evidence of thrombus. Normal compressibility, respiratory phasicity and  response to augmentation. Calf Veins: No evidence of thrombus. Normal compressibility and flow on color Doppler imaging. Superficial Great Saphenous Vein: No evidence of thrombus. Normal compressibility. Venous Reflux:  None. Other Findings: No evidence of superficial thrombophlebitis or abnormal fluid collection. IMPRESSION: 1. No evidence of deep venous thrombosis in either lower extremity. 2. Age indeterminate nonocclusive superficial thrombophlebitis in the upper thigh segment of the right great saphenous vein. Electronically Signed   By: Aletta Edouard M.D.   On: 02/25/2021 09:57   DG Chest Port 1 View  Result Date: 02/24/2021 CLINICAL DATA:  Difficulty breathing, hypoxia EXAM: PORTABLE CHEST 1 VIEW COMPARISON:  Previous studies including the examination of 02/09/2021 FINDINGS: Transverse diameter of heart is increased. Pacemaker/defibrillator battery is seen in the left infraclavicular region. Biventricular pacer leads are noted in place. Central pulmonary vessels are slightly more prominent. There is no focal pulmonary consolidation. There is no significant pleural effusion or pneumothorax. IMPRESSION: Cardiomegaly. Central pulmonary vessels are more prominent, possibly suggesting mild CHF. No new focal infiltrates are seen. Electronically Signed   By: Elmer Picker M.D.   On: 02/24/2021 14:48     Echo severely depressed left ventricular function  TELEMETRY: Paced rhythm rate of around 70:  ASSESSMENT AND PLAN:  Principal Problem:   Acute on chronic systolic (congestive) heart failure (HCC) Active Problems:   COPD, severe (HCC)   Acquired hypothyroidism   Acute on chronic respiratory failure with hypoxia (HCC) - home O2 @ 3 L/min   Acute renal failure superimposed  on stage 3b chronic kidney disease ([place])   Hypokalemia   Hypomagnesemia   Pressure injury of skin    Plan Agree with ICU level care Continue broad-spectrum antibiotic therapy for sepsis related cellulitis Continue  blood pressure support as necessary Inotropic support with milrinone should be helpful Severe systolic dysfunction AICD in place reasonably compensated Hold blood pressure medications AICD appears to be functioning adequately no need for shock Atrial fibrillation reasonably stable continue anticoagulation as necessary Supplemental oxygen therapy as necessary   Yolonda Kida, MD 02/26/2021 10:05 AM

## 2021-02-26 NOTE — Progress Notes (Signed)
Pharmacy Antibiotic Note  Maria Neal is a 71 y.o. female admitted on 02/28/2021 with bacteremia.  Pharmacy has been consulted for Cefazolin dosing. -MSSA bacteremia- 3/4 bottles -hx AICD, plan TEE to r/o endocarditits  Plan: Cefazolin 2 gm q12h per indication and Crcl 23.1 ml/min  Pharmacy will continue to follow and adjusted abx dosing when warranted.  Height: 5\' 4"  (162.6 cm) Weight: 78 kg (171 lb 15.3 oz) IBW/kg (Calculated) : 54.7  Temp (24hrs), Avg:97.6 F (36.4 C), Min:97.4 F (36.3 C), Max:97.9 F (36.6 C)  Recent Labs  Lab 02/05/2021 2202 02/24/21 0527 02/24/21 1743 02/25/21 0424 02/26/21 0358  WBC 8.7 9.1  --  12.0* 11.0*  CREATININE 2.42* 2.24*  --  2.45* 2.29*  LATICACIDVEN  --   --  1.9  --   --      Estimated Creatinine Clearance: 23.1 mL/min (A) (by C-G formula based on SCr of 2.29 mg/dL (H)).    Allergies  Allergen Reactions   Levaquin [Levofloxacin] Anaphylaxis   Amoxicillin Hives    Did it involve swelling of the face/tongue/throat, SOB, or low BP? No Did it involve sudden or severe rash/hives, skin peeling, or any reaction on the inside of your mouth or nose? No Did you need to seek medical attention at a hospital or doctor's office? No When did it last happen?      10+ years If all above answers are "NO", may proceed with cephalosporin use.   Nitrofuran Derivatives Other (See Comments)    Unknown   Penicillins Other (See Comments)    Thinks it made her itch a lot, but isn't sure. Did it involve swelling of the face/tongue/throat, SOB, or low BP? No Did it involve sudden or severe rash/hives, skin peeling, or any reaction on the inside of your mouth or nose? No Did you need to seek medical attention at a hospital or doctor's office? No When did it last happen?      10+ years If all above answers are "NO", may proceed with cephalosporin use.    Zithromax [Azithromycin] Other (See Comments)   Antihistamines, Chlorpheniramine-Type Rash     Antimicrobials this admission: 2/23 Ceftriaxone >> 2/24 2/24 Cefazolin >>   Microbiology results: 2/23 BCx: 4 of 4 bottles: Staph Aureus, no resistance  Thank you for allowing pharmacy to be a part of this patients care.  Chinita Greenland PharmD Clinical Pharmacist 02/26/2021

## 2021-02-27 ENCOUNTER — Inpatient Hospital Stay: Payer: Medicare Other

## 2021-02-27 DIAGNOSIS — I5023 Acute on chronic systolic (congestive) heart failure: Secondary | ICD-10-CM | POA: Diagnosis not present

## 2021-02-27 LAB — BASIC METABOLIC PANEL
Anion gap: 12 (ref 5–15)
BUN: 73 mg/dL — ABNORMAL HIGH (ref 8–23)
CO2: 34 mmol/L — ABNORMAL HIGH (ref 22–32)
Calcium: 8.6 mg/dL — ABNORMAL LOW (ref 8.9–10.3)
Chloride: 89 mmol/L — ABNORMAL LOW (ref 98–111)
Creatinine, Ser: 2.05 mg/dL — ABNORMAL HIGH (ref 0.44–1.00)
GFR, Estimated: 26 mL/min — ABNORMAL LOW (ref >60)
Glucose, Bld: 83 mg/dL (ref 70–99)
Potassium: 4.1 mmol/L (ref 3.5–5.1)
Sodium: 135 mmol/L (ref 135–145)

## 2021-02-27 LAB — CBC WITH DIFFERENTIAL/PLATELET
Abs Immature Granulocytes: 0.06 10*3/uL (ref 0.00–0.07)
Basophils Absolute: 0 10*3/uL (ref 0.0–0.1)
Basophils Relative: 0 %
Eosinophils Absolute: 0.1 10*3/uL (ref 0.0–0.5)
Eosinophils Relative: 1 %
HCT: 31.6 % — ABNORMAL LOW (ref 36.0–46.0)
Hemoglobin: 9.3 g/dL — ABNORMAL LOW (ref 12.0–15.0)
Immature Granulocytes: 1 %
Lymphocytes Relative: 11 %
Lymphs Abs: 0.9 10*3/uL (ref 0.7–4.0)
MCH: 25 pg — ABNORMAL LOW (ref 26.0–34.0)
MCHC: 29.4 g/dL — ABNORMAL LOW (ref 30.0–36.0)
MCV: 84.9 fL (ref 80.0–100.0)
Monocytes Absolute: 0.6 10*3/uL (ref 0.1–1.0)
Monocytes Relative: 7 %
Neutro Abs: 6.6 10*3/uL (ref 1.7–7.7)
Neutrophils Relative %: 80 %
Platelets: 88 10*3/uL — ABNORMAL LOW (ref 150–400)
RBC: 3.72 MIL/uL — ABNORMAL LOW (ref 3.87–5.11)
RDW: 20.8 % — ABNORMAL HIGH (ref 11.5–15.5)
WBC: 8.3 10*3/uL (ref 4.0–10.5)
nRBC: 1.2 % — ABNORMAL HIGH (ref 0.0–0.2)

## 2021-02-27 LAB — CULTURE, BLOOD (ROUTINE X 2)

## 2021-02-27 LAB — PHOSPHORUS: Phosphorus: 2.2 mg/dL — ABNORMAL LOW (ref 2.5–4.6)

## 2021-02-27 LAB — MAGNESIUM: Magnesium: 2.1 mg/dL (ref 1.7–2.4)

## 2021-02-27 MED ORDER — MELATONIN 5 MG PO TABS
2.5000 mg | ORAL_TABLET | Freq: Every day | ORAL | Status: DC
  Administered 2021-02-27: 2.5 mg via ORAL
  Filled 2021-02-27: qty 1

## 2021-02-27 MED ORDER — HALOPERIDOL LACTATE 5 MG/ML IJ SOLN: 1.0000 mg | Freq: Four times a day (QID) | INTRAMUSCULAR | Status: DC | PRN

## 2021-02-27 MED ORDER — FUROSEMIDE 10 MG/ML IJ SOLN
40.0000 mg | Freq: Once | INTRAMUSCULAR | Status: AC
  Administered 2021-02-27: 40 mg via INTRAVENOUS
  Filled 2021-02-27: qty 4

## 2021-02-27 MED ORDER — MORPHINE 100MG IN NS 100ML (1MG/ML) PREMIX INFUSION
1.0000 mg/h | INTRAVENOUS | Status: DC
  Administered 2021-02-27: 5 mg/h via INTRAVENOUS
  Filled 2021-02-27: qty 100

## 2021-02-27 MED ORDER — GLYCOPYRROLATE 0.2 MG/ML IJ SOLN: 0.2000 mg | INTRAMUSCULAR | Status: DC | PRN

## 2021-02-27 MED ORDER — ALPRAZOLAM 0.5 MG PO TABS
0.5000 mg | ORAL_TABLET | Freq: Two times a day (BID) | ORAL | Status: DC | PRN
  Administered 2021-02-27: 0.5 mg via ORAL
  Filled 2021-02-27: qty 1

## 2021-02-27 MED ORDER — SODIUM PHOSPHATES 45 MMOLE/15ML IV SOLN
15.0000 mmol | Freq: Once | INTRAVENOUS | Status: AC
  Administered 2021-02-27: 15 mmol via INTRAVENOUS
  Filled 2021-02-27: qty 5

## 2021-02-27 MED ORDER — LORAZEPAM 2 MG/ML IJ SOLN: 1.0000 mg | INTRAMUSCULAR | Status: DC | PRN

## 2021-02-28 ENCOUNTER — Inpatient Hospital Stay: Payer: Medicare Other | Admitting: Nurse Practitioner

## 2021-03-01 ENCOUNTER — Ambulatory Visit: Payer: Medicare Other | Admitting: Family

## 2021-03-02 NOTE — Progress Notes (Signed)
Chaplain Maggie offered grief support and hospin wake of pt's passing. Large gathering of family engaged in storytelling about pt's life.

## 2021-03-02 NOTE — Progress Notes (Signed)
Family at bedside; patient receiving comfort care measures only. Noted to be apneic and unresponsive. This RN to bedside to listen for heart sounds, and no heart sounds heard. Charge RN in to also auscultate heart sounds as well. Chaplain also at bedside for family support. Dr. Patsey Berthold also to bedside to speak with family. Time of death 1553 pm.

## 2021-03-02 NOTE — Progress Notes (Signed)
Chaplain Maggie introduced support to pt's daughter and other family members in the hallway outside ICU. The children present were provided with word search puzzles and coloring sheets. Daughter was tearful yet appreciative of the expression of care. Chaplain available to offere continued support as needed and will follow up.

## 2021-03-02 NOTE — Progress Notes (Signed)
Pharmacy Antibiotic Note  Maria Neal is a 71 y.o. female admitted on 02/11/2021 with bacteremia.  Pharmacy has been consulted for Cefazolin dosing. -MSSA bacteremia- 4/4 bottles -hx AICD, plan TEE to r/o endocarditits  Plan: Cefazolin 2 gm q12h per indication and Crcl 25.62ml/min  Pharmacy will continue to follow and adjusted abx dosing when warranted.   Height: 5\' 4"  (162.6 cm) Weight: 77.2 kg (170 lb 3.1 oz) IBW/kg (Calculated) : 54.7  Temp (24hrs), Avg:97.8 F (36.6 C), Min:97.6 F (36.4 C), Max:98 F (36.7 C)  Recent Labs  Lab 02/20/2021 2202 02/24/21 0527 02/24/21 1743 02/25/21 0424 02/26/21 0358 02-28-2021 0256  WBC 8.7 9.1  --  12.0* 11.0* 8.3  CREATININE 2.42* 2.24*  --  2.45* 2.29* 2.05*  LATICACIDVEN  --   --  1.9  --   --   --      Estimated Creatinine Clearance: 25.7 mL/min (A) (by C-G formula based on SCr of 2.05 mg/dL (H)).    Allergies  Allergen Reactions   Levaquin [Levofloxacin] Anaphylaxis   Amoxicillin Hives    Did it involve swelling of the face/tongue/throat, SOB, or low BP? No Did it involve sudden or severe rash/hives, skin peeling, or any reaction on the inside of your mouth or nose? No Did you need to seek medical attention at a hospital or doctor's office? No When did it last happen?      10+ years If all above answers are "NO", may proceed with cephalosporin use.   Nitrofuran Derivatives Other (See Comments)    Unknown   Penicillins Other (See Comments)    Thinks it made her itch a lot, but isn't sure. Did it involve swelling of the face/tongue/throat, SOB, or low BP? No Did it involve sudden or severe rash/hives, skin peeling, or any reaction on the inside of your mouth or nose? No Did you need to seek medical attention at a hospital or doctor's office? No When did it last happen?      10+ years If all above answers are "NO", may proceed with cephalosporin use.    Zithromax [Azithromycin] Other (See Comments)   Antihistamines,  Chlorpheniramine-Type Rash    Antimicrobials this admission: 2/23 Ceftriaxone >> 2/24 2/24 Cefazolin >>   Microbiology results: 2/23 BCx: 4 of 4 bottles: Staph Aureus, no resistance  Thank you for allowing pharmacy to be a part of this patients care.  Chinita Greenland PharmD Clinical Pharmacist 2021-02-28

## 2021-03-02 NOTE — Progress Notes (Signed)
Updated patient's family including daughter Dalisha Shively, brothers Marcello Moores and Aletha Halim, patient's son and multiple other family members.  Made it very clear that Ms. Widen continues to deteriorate and is not responding to inotrope infusion.  Her cardiac function is very poor and will not sustain her life for much longer.  Also her pulmonary function is very poor and she is currently unable to maintain adequate oxygenation.  In my estimation the patient should transition to palliative/hospice care.  Family was gathering with regards to [place].  When they were visiting the patient became for the most part unresponsive and saturations decreased to the 70s even on high flow nasal cannula.  At this point all the family is gathered and the consensus is to transition the patient to comfort care.  This is appropriate giving the patient severe physiologic derangements that are nonreversible.   We will transition the patient to comfort measures, see orders for details.  Renold Don, MD Advanced Bronchoscopy PCCM [facility]    *This note was dictated using voice recognition software/Dragon.  Despite best efforts to proofread, errors can occur which can change the meaning. Any transcriptional errors that result from this process are unintentional and may not be fully corrected at the time of dictation.

## 2021-03-02 NOTE — Consult Note (Signed)
PHARMACY CONSULT NOTE  Pharmacy Consult for Electrolyte Monitoring and Replacement   Recent Labs: Potassium (mmol/L)  Date Value  Mar 21, 2021 4.1  12/07/2013 3.8   Magnesium (mg/dL)  Date Value  03-21-21 2.1   Calcium (mg/dL)  Date Value  2021-03-21 8.6 (L)   Calcium, Total (mg/dL)  Date Value  12/07/2013 8.7   Albumin (g/dL)  Date Value  02/24/2021 2.4 (L)  01/31/2021 3.5 (L)  02/22/2013 3.4   Phosphorus (mg/dL)  Date Value  03-21-2021 2.2 (L)   Sodium (mmol/L)  Date Value  Mar 21, 2021 135  01/31/2021 141  12/07/2013 139   Assessment: Patient is a 71 y/o F with medical history including cardiomyopathy / systolic CHF with EF < 43% s/p AICD, Afib, anxiety, COPD on 3L O2 at baseline who is admitted with Staphylococcus aureus bacteremia and acute on chronic CHF. Pharmacy consulted to assist with electrolyte monitoring and replacement as indicated.  Patient is requiring Levophed and milrinone for cardiovascular support  Goal of Therapy:  K > = 4 Mg > = 2 All other electrolytes within normal limits  Plan:  Phos 2.2 (K 4.1  Na 135) --NP has ordered Sodium Phosphate 15 mmol IV x 1 --Will follow-up electrolytes with AM labs tomorrow  Jacqualine Weichel A 03-21-2021 11:56 AM

## 2021-03-02 NOTE — Progress Notes (Addendum)
NAME:  Maria Neal, MRN:  [id number], DOB:  05-27-1950, LOS: 4 ADMISSION DATE:  02/20/2021, CONSULTATION DATE: 02/24/2021 REFERRING MD: Dr. Tawanna Solo, CHIEF COMPLAINT: Hypotension    History of Present Illness:  This is a 71 yo female who presented to [facility] ER on 02/22 from home via EMS with a 1-2 day hx of worsening shortness of breath and hypoxia with O2 sats in the 80's (wears 3L O2 at baseline).  She was recently hospitalized from 02/14~02/18 following treatment of acute on chronic combined CHF exacerbation pt discharged with increased dose of torsemide 40 mg daily.    ED course Upon arrival to the ER lab results revealed chloride 91, glucose 100, BUN 67, creatinine 2.42, BNP >4,500, troponin 131, hgb 9.5, platelets 121, PT 17.8/INR 1.5, and UA with trace leukocytes and rare bacteria.  CXR revealed stable cardiomegaly and vascular engorgement without overt edema.  COVID-19/Influenza PCR A&B negative.  Per ER notes pt noted to have rales and 3+ bilateral lower extremity pitting edema.  Pt also hypoxic initially requiring NRB, then transitioned to Bipap.  She also received 60 mg iv lasix x2 doses and sublingual nitroglycerin x1 dose.  Pt admitted to the stepdown unit per hospitalist team, but remained in the ER pending bed availability.    PCCM team consulted 02/23 due to pt developing hypotension map in the 50's requiring levophed gtt.  Pts status changed to ICU.   Pertinent  Medical History  Acute Appendicitis  AICD Anxiety  Atrial Fibrillation  CAD  Cardiomyopathy  COPD on chronic O2 @3L   Chronic Diastolic Systolic CHF~EF <47% via Echo 02/15/2021 Headache Kidney Stones  HTN Influenza A  Restless Leg Syndrome   Micro Data:  02/09/2021: SARS-CoV-2 & Influenza PCR>>negative 02/24/21: Blood cultures>> STAPHYLOCOCCUS AUREUS  02/26/21: Repeat blood cultures>>  Antimicrobials:  Ceftriaxone 2/23>>2/24 Cefazolin 2/24>>  Significant Hospital Events: Including procedures, antibiotic start  and stop dates in addition to other pertinent events   02/22: Pt admitted to the stepdown unit with acute on chronic respiratory failure secondary to CHF exacerbation requiring intermittent Bipap remained in the ER pending bed availability  02/23: PCCM team consulted pt developed hypotension requiring levophed gtt and transfer to ICU 02/23: CT Forearm Left revealed subcutaneous soft tissue swelling/edema possibly reflecting cellulitis. No discrete fluid collection to suggest abscess. No findings suspicious for septic arthritis osteomyelitis. Severe diffuse left-sided body wall edema 02/24: Weaning levophed gtt and HFNC; Blood cultures positive for staph aureus 4/4 bottles abx changed from ceftriaxone to cefazolin. ID consulted 2/25: Remains on Levophed (currently at 8 mcg), and Milrinone infusions. Creatinine slightly improved, UOP acceptable.  Repeat blood cultures pending 2/26: Off of Levophed but increase FiO2 requirements, increased work of breathing, not responding to Lasix await family for EOL discussions  Interim History / Subjective:  -Increased work of breathing overnight, increasing O2 requirements, bibasilar rales -Pt more awake and alert than previously noted (oriented to self and place), looks uncomfortable -No progression of left arm cellulitis from previous markings, pt denies pain or itchiness currently at site ~ CT left  forearm without abscess, septic joint, or osteomyelitis -Repeat blood cultures drawn 2/25, pending -Remains on Milrinone infusion -Creatinine slightly improved to 2.05 (2.29, UOP 650, still in positive fluid balance from admission -Leukocytosis resolved -Thrombocytopenia, 88k, no evidence of bleeding  Objective   Blood pressure (!) 125/59, pulse 70, temperature 97.7 F (36.5 C), temperature source Axillary, resp. rate (!) 27, height 5\' 4"  (1.626 m), weight 77.2 kg, SpO2 97 %.  Intake/Output Summary (Last 24 hours) at 03-10-2021 0743 Last data filed at  10-Mar-2021 0600 Gross per 24 hour  Intake 404.32 ml  Output 650 ml  Net -245.68 ml    Filed Weights   02/25/21 0218 02/26/21 0500 2021-03-10 0500  Weight: 77.8 kg 78 kg 77.2 kg    Examination: General: acute on chronically ill appearing female, looks uncomfortable, no increased work of breathing per se  HEENT: supple, edentulous, oral mucosa moist Lungs: diminished throughout, basilar rales noted, significant kyphosis Cardiovascular: ventricular paced rhythm, grade 2/6 to 3/6 mitral regurgitation murmur 1+ bilateral lower extremity edema; 2+ LUE edema  Abdomen: + BS x4, obese, soft, non tender, non distended  Extremities: normal bulk, moves all extremities  Neuro: Awake and alert, oriented to person and place, follows commands, no focal deficits, speech clear, PERRL Skin: left wrist erythematous and warm due to cellulitis, scattered ecchymosis   GU: voiding via [facility] Problem list   Hypokalemia   Assessment & Plan:   Acute on chronic hypoxic respiratory failure secondary to acute CHF exacerbation also concerning for possible PE  Hx: COPD and Chronic Home O2 @3L  (FEV1 0.65 L or 29% of predicted in May 2022) consistent with stage IV or very severe COPD by GOLD criteria -Supplemental O2 or prn Bipap for dyspnea and/or hypoxia -Maintain O2 sats 88% to 92% -Continue scheduled and prn bronchodilator therapy  -Unable to obtain CTA Chest due to CKD; Bilateral LE venous US negative for DVT (did show nonocclusive superficial thrombophlebitis in upper thigh segment of right great saphenous vein) -May need to consider IVC filter placement  Multifactorial Shock: Cardiogenic + Septic Acute on chronic combined diastolic/systolic CHF with severe mitral valve regurgitation ~EF <20% via Echo 02/15/2021 Nonischemic dilated cardiomyopathy  Elevated troponin secondary to demand ischemia vs. NSTEMI~troponin's peaked at 2,480 Hx: AICD, Paroxysmal Atrial Fibrillation (unable to  anticoagulate due to GI bleed secondary to eliquis), Chronic Hypotension   -Continuous cardiac monitoring -Maintain MAP >60 -Vasopressors as needed to maintain MAP goal ~ currently on Levophed at 8 mcg -Continue Milrinone gtt -Discontinue midodrine due to volume overload -Lactic acid normalized -Needs to resume diuretics, challenge with Lasix -Difficulty maintaining pure wick in place, Foley catheter for accurate I&O -Cardiology following, appreciate input   Severe Sepsis due to LUE cellulitis~Staph Aureus Bacteremia (blood cultures positive for staph aureus in 4/4 bottles 02/24)  -Monitor fever curve -Trend WBC's & Procalcitonin -Follow cultures as above -Continue empiric Cefazolin pending cultures & sensitivities -ID is following, appreciate input, will defer further ABX to ID -CT left forearm on 2/24 is concerning for cellulitis, but no findings to suggest abscess, septic arthritis, or osteomyelitis  CKD stage IV Suspect superimposed cardiorenal syndrome -Monitor I&O's / urinary output -Follow BMP -Ensure adequate renal perfusion -Avoid nephrotoxic agents as able -Replace electrolytes as indicated -Continue Inotropes  Chronic normocytic anemia  Thrombocytopenia Hx: GI bleed  -Trend CBC  -Monitor for s/sx of bleeding and transfuse for hgb <7 -Transfuse platelets for platelet count <50 K with active bleeding -Avoid chemical VTE px  -Smear review with normal platelets  Hypothyroidism -Continue synthroid -TSH is normal at 1.45, free T4 1.94  GERD  -Continue PPI   Acute toxic encephalopathy secondary to sepsis  -Provide supportive care -Frequent reorientation, promote normal sleep/wake cycle, family presence promoted -Avoid sedating medications  Moderate to severe protein calorie malnutrition Advanced debility and frailty -Present on admission -Receiving protein supplements -Unable to participate with PT due to critical illness  Best  Practice (right click and  "Reselect all SmartList Selections" daily)   Diet/type: Regular consistency (see orders) DVT prophylaxis: SCD GI prophylaxis: PPI Lines: N/A Foley:  N/A Code Status:  full code Last date of multidisciplinary goals of care discussion [2021/03/22]  Care management consult placed for SNF placement at discharge per pts daughter request.   Addressing end-of-life issues discussion as patient's prognosis overall is exceedingly poor given multiple severe comorbidities with no reversibility possible.   Labs   CBC: Recent Labs  Lab 02/12/2021 2202 02/24/21 0527 02/25/21 0424 02/26/21 0358 03/22/21 0256  WBC 8.7 9.1 12.0* 11.0* 8.3  NEUTROABS  --  7.8* 10.7* 9.2* 6.6  HGB 9.5* 8.8* 9.8* 9.1* 9.3*  HCT 33.5* 30.6* 34.3* 31.3* 31.6*  MCV 87.9 88.2 88.6 85.5 84.9  PLT 121* 94* 107* 101* 88*     Basic Metabolic Panel: Recent Labs  Lab 02/22/2021 2202 02/24/21 0527 02/24/21 1952 02/25/21 0424 02/26/21 0358 03-22-21 0256  NA 136 139  --  137 132* 135  K 4.1 3.4* 4.3 4.4 4.0 4.1  CL 91* 98  --  91* 91* 89*  CO2 31 30  --  32 31 34*  GLUCOSE 100* 103*  --  145* 114* 83  BUN 67* 63*  --  71* 76* 73*  CREATININE 2.42* 2.24*  --  2.45* 2.29* 2.05*  CALCIUM 8.9 8.0*  --  8.6* 8.0* 8.6*  MG 1.8 1.6* 1.9 2.1 2.0 2.1  PHOS  --  3.0  --  3.5 2.8 2.2*    GFR: Estimated Creatinine Clearance: 25.7 mL/min (A) (by C-G formula based on SCr of 2.05 mg/dL (H)). Recent Labs  Lab 02/24/21 0527 02/24/21 1743 02/25/21 0424 02/26/21 0358 2021/03/22 0256  PROCALCITON 2.20  --   --   --   --   WBC 9.1  --  12.0* 11.0* 8.3  LATICACIDVEN  --  1.9  --   --   --      Liver Function Tests: Recent Labs  Lab 02/24/21 0527  AST 23  ALT 21  ALKPHOS 67  BILITOT 2.2*  PROT 5.3*  ALBUMIN 2.4*    No results for input(s): LIPASE, AMYLASE in the last 168 hours. No results for input(s): AMMONIA in the last 168 hours.  ABG    Component Value Date/Time   PHART 7.39 02/25/2021 0451   PCO2ART 59  (H) 02/25/2021 0451   PO2ART 57 (L) 02/25/2021 0451   HCO3 35.7 (H) 02/25/2021 0451   TCO2 29 08/07/2020 1302   O2SAT 89.8 02/25/2021 0451      Coagulation Profile: Recent Labs  Lab 02/21/2021 2202  INR 1.5*     Cardiac Enzymes: No results for input(s): CKTOTAL, CKMB, CKMBINDEX, TROPONINI in the last 168 hours.  HbA1C: Hgb A1c MFr Bld  Date/Time Value Ref Range Status  09/23/2020 11:07 AM 5.8 (H) 4.8 - 5.6 % Final    Comment:             Prediabetes: 5.7 - 6.4          Diabetes: >6.4          Glycemic control for adults with diabetes: <7.0   03/10/2020 03:36 PM 5.6 4.8 - 5.6 % Final    Comment:             Prediabetes: 5.7 - 6.4          Diabetes: >6.4          Glycemic control for adults with diabetes: <  7.0     CBG: Recent Labs  Lab 02/24/21 2010  GLUCAP 145*     Review of Systems: Positives in BOLD   Gen: Denies fever, chills, weight change, fatigue, night sweats HEENT: Denies blurred vision, double vision, hearing loss, tinnitus, sinus congestion, rhinorrhea, sore throat, neck stiffness, dysphagia PULM: shortness of breath, cough, sputum production, hemoptysis, wheezing CV: Denies chest pain, edema, orthopnea, paroxysmal nocturnal dyspnea, palpitations GI: Denies abdominal pain, nausea, vomiting, diarrhea, hematochezia, melena, constipation, change in bowel habits GU: Denies dysuria, hematuria, polyuria, oliguria, urethral discharge Endocrine: Denies hot or cold intolerance, polyuria, polyphagia or appetite change Derm: left forearm pain/erythema, rash, dry skin, scaling or peeling skin change Heme: Denies easy bruising, bleeding, bleeding gums Neuro: Denies headache, numbness, weakness, slurred speech, loss of memory or consciousness Allergies Allergies  Allergen Reactions   Levaquin [Levofloxacin] Anaphylaxis   Amoxicillin Hives    Did it involve swelling of the face/tongue/throat, SOB, or low BP? No Did it involve sudden or severe rash/hives, skin  peeling, or any reaction on the inside of your mouth or nose? No Did you need to seek medical attention at a hospital or doctor's office? No When did it last happen?      10+ years If all above answers are "NO", may proceed with cephalosporin use.   Nitrofuran Derivatives Other (See Comments)    Unknown   Penicillins Other (See Comments)    Thinks it made her itch a lot, but isn't sure. Did it involve swelling of the face/tongue/throat, SOB, or low BP? No Did it involve sudden or severe rash/hives, skin peeling, or any reaction on the inside of your mouth or nose? No Did you need to seek medical attention at a hospital or doctor's office? No When did it last happen?      10+ years If all above answers are "NO", may proceed with cephalosporin use.    Zithromax [Azithromycin] Other (See Comments)   Antihistamines, Chlorpheniramine-Type Rash    Scheduled Meds:  amiodarone  100 mg Oral Daily   Chlorhexidine Gluconate Cloth  6 each Topical Daily   feeding supplement  237 mL Oral BID BM   fluticasone furoate-vilanterol  1 puff Inhalation Daily   And   umeclidinium bromide  1 puff Inhalation Daily   heparin injection (subcutaneous)  5,000 Units Subcutaneous Q12H   iron polysaccharides  150 mg Oral Daily   levothyroxine  175 mcg Oral Q0600   mouth rinse  15 mL Mouth Rinse BID   melatonin  2.5 mg Oral QHS   midodrine  10 mg Oral TID WC   multivitamin with minerals  1 tablet Oral Daily   pantoprazole  40 mg Oral Daily   traZODone  50 mg Oral QHS   Continuous Infusions:  sodium chloride Stopped (02/24/21 1446)    ceFAZolin (ANCEF) IV Stopped (02/26/21 2301)   milrinone 0.125 mcg/kg/min (03-13-2021 0600)   norepinephrine (LEVOPHED) Adult infusion Stopped (02/26/21 1323)   sodium phosphate  Dextrose 5% IVPB 15 mmol (03/13/21 0646)   PRN Meds:.acetaminophen **OR** acetaminophen, albuterol, ALPRAZolam, fluticasone      Critical care time: 40 minutes     The patient is critically ill  with multiple organ systems failure and requires high complexity decision making for assessment and support, frequent evaluation and titration of therapies, application of advanced monitoring technologies and extensive interpretation of multiple databases.   Renold Don, MD Advanced Bronchoscopy PCCM [facility]    *This note was dictated using voice recognition software/Dragon.  Despite best efforts to proofread, errors can occur which can change the meaning. Any transcriptional errors that result from this process are unintentional and may not be fully corrected at the time of dictation.

## 2021-03-02 NOTE — Death Summary Note (Signed)
DEATH SUMMARY   Patient Details  Name: Maria Neal MRN: [id number] DOB: 02-14-1950  Admission/Discharge Information   Admit Date:  03-21-2021  Date of Death: Date of Death: March 25, 2021  Time of Death: Time of Death: 1551/04/13  Length of Stay: 4  Referring Physician: Jon Billings, NP   Reason(s) for Hospitalization  Worsening shortness of breath and hypoxia.  Diagnoses  Preliminary cause of death:  Acute decompensation of chronic systolic heart failure with cardiogenic shock Secondary Diagnoses (including complications and co-morbidities):  Principal Problem:   Acute on chronic systolic (congestive) heart failure (HCC) Active Problems:   Acute on chronic respiratory failure with hypoxia (HCC) - home O2 @ 3 L/min   NICM (nonischemic cardiomyopathy) (HCC)   Acquired hypothyroidism   COPD, severe (HCC)   Acute renal failure superimposed on stage 3b chronic kidney disease (HCC)   Pressure injury of skin Severe sepsis with septic shock Staff aureus bacteremia Left upper extremity cellulitis  Brief Hospital Course (including significant findings, care, treatment, and services provided and events leading to death)  Maria Neal is a 71 y.o. year old female  who presented to [facility] ER on 2022/03/21 from home via EMS with a 1-2 day hx of worsening shortness of breath and hypoxia with O2 sats in the 80's (wears 3L O2 at baseline). She was recently hospitalized from 02/14~02/18 following treatment of acute on chronic combined CHF exacerbation pt discharged with increased dose of torsemide 40 mg daily.     ED course Upon arrival to the ER lab results revealed chloride 91, glucose 100, BUN 67, creatinine 2.42, BNP >4,500, troponin 131, hgb 9.5, platelets 121, PT 17.8/INR 1.5, and UA with trace leukocytes and rare bacteria.  CXR revealed stable cardiomegaly and vascular engorgement without overt edema.  COVID-19/Influenza PCR A&B negative.  Per ER notes pt noted to have rales and 3+ bilateral lower  extremity pitting edema.  Pt also hypoxic initially requiring NRB, then transitioned to Bipap.  She also received 60 mg iv lasix x2 doses and sublingual nitroglycerin x1 dose.  Pt admitted to the stepdown unit per hospitalist team, but remained in the ER pending bed availability.     PCCM team consulted 02/23 due to pt developing hypotension map in the 50's requiring levophed gtt.  Pts status changed to ICU.   Patient was noted to have a left upper extremity cellulitis and developed Staph aureus bacteremia.  She developed worsening acute on chronic renal failure likely due to shock physiology from both septic and cardiogenic shock.  She was placed on inotropes (milrinone) with only minimal improvement.  Remainder of the significant events as below: 2022/03/21: Pt admitted to the stepdown unit with acute on chronic respiratory failure secondary to CHF exacerbation requiring intermittent Bipap remained in the ER pending bed availability  02/23: PCCM team consulted pt developed hypotension requiring levophed gtt and transfer to ICU 02/23: CT Forearm Left revealed subcutaneous soft tissue swelling/edema possibly reflecting cellulitis. No discrete fluid collection to suggest abscess. No findings suspicious for septic arthritis osteomyelitis. Severe diffuse left-sided body wall edema 02/24: Weaning levophed gtt and HFNC; Blood cultures positive for staph aureus 4/4 bottles abx changed from ceftriaxone to cefazolin. ID consulted 2/25: Remains on Levophed (currently at 8 mcg), and Milrinone infusions. Creatinine slightly improved, UOP acceptable.  Repeat blood cultures pending 03-25-22: Off of Levophed but increase FiO2 requirements, increased work of breathing, not responding to Lasix await family for EOL discussions  Family as a whole was updated on March 25, 2022  and apprised of her continuing decline despite pressor and inotrope support and antibiotics.  Patient developed increased FiO2 requirements and developed  worsening respiratory distress.  She was diuresed with no significant improvement on her condition.  Family opted to transition the patient to DNR/comfort measures status.  The patient was transitioned to comfort measures and subsequently expired at 1553 hrs. with family in attendance.  Pertinent Labs and Studies  Significant Diagnostic Studies CT FOREARM LEFT WO CONTRAST  Result Date: 02/24/2021 CLINICAL DATA:  Left forearm pain and swelling. EXAM: CT OF THE LEFT FOREARM WITHOUT CONTRAST TECHNIQUE: Multidetector CT imaging was performed according to the standard protocol. Multiplanar CT image reconstructions were also generated. RADIATION DOSE REDUCTION: This exam was performed according to the departmental dose-optimization program which includes automated exposure control, adjustment of the mA and/or kV according to patient size and/or use of iterative reconstruction technique. COMPARISON:  None. FINDINGS: Subcutaneous soft tissue swelling/edema possibly reflecting cellulitis. No discrete fluid collection to suggest abscess. No findings suspicious for septic arthritis or osteomyelitis. No obvious pyomyositis. No obvious gas in the soft tissues. Severe diffuse left-sided body wall edema also noted. IMPRESSION: 1. Subcutaneous soft tissue swelling/edema possibly reflecting cellulitis. No discrete fluid collection to suggest abscess. 2. No findings suspicious for septic arthritis osteomyelitis. 3. Severe diffuse left-sided body wall edema. Electronically Signed   By: Marijo Sanes M.D.   On: 02/24/2021 18:54   US Venous Img Lower Bilateral (DVT)  Result Date: 02/25/2021 CLINICAL DATA:  Bilateral lower extremity edema. EXAM: BILATERAL LOWER EXTREMITY VENOUS DOPPLER ULTRASOUND TECHNIQUE: Gray-scale sonography with graded compression, as well as color Doppler and duplex ultrasound were performed to evaluate the lower extremity deep venous systems from the level of the common femoral vein and including the  common femoral, femoral, profunda femoral, popliteal and calf veins including the posterior tibial, peroneal and gastrocnemius veins when visible. The superficial great saphenous vein was also interrogated. Spectral Doppler was utilized to evaluate flow at rest and with distal augmentation maneuvers in the common femoral, femoral and popliteal veins. COMPARISON:  None. FINDINGS: RIGHT LOWER EXTREMITY Common Femoral Vein: No evidence of thrombus. Normal compressibility, respiratory phasicity and response to augmentation. Saphenofemoral Junction: No evidence of thrombus. Normal compressibility and flow on color Doppler imaging. Profunda Femoral Vein: No evidence of thrombus. Normal compressibility and flow on color Doppler imaging. Femoral Vein: No evidence of thrombus. Normal compressibility, respiratory phasicity and response to augmentation. Popliteal Vein: No evidence of thrombus. Normal compressibility, respiratory phasicity and response to augmentation. Calf Veins: No evidence of thrombus. Normal compressibility and flow on color Doppler imaging. Superficial Great Saphenous Vein: Suggestion of a mild amount of nonocclusive mural thrombus in the proximal/upper thigh segment of the right GSV. There is clearly flow through this segment and this is not causing significant luminal narrowing. Venous Reflux:  None. Other Findings:  No abnormal fluid collection. LEFT LOWER EXTREMITY Common Femoral Vein: No evidence of thrombus. Normal compressibility, respiratory phasicity and response to augmentation. Saphenofemoral Junction: No evidence of thrombus. Normal compressibility and flow on color Doppler imaging. Profunda Femoral Vein: No evidence of thrombus. Normal compressibility and flow on color Doppler imaging. Femoral Vein: No evidence of thrombus. Normal compressibility, respiratory phasicity and response to augmentation. Popliteal Vein: No evidence of thrombus. Normal compressibility, respiratory phasicity and  response to augmentation. Calf Veins: No evidence of thrombus. Normal compressibility and flow on color Doppler imaging. Superficial Great Saphenous Vein: No evidence of thrombus. Normal compressibility. Venous Reflux:  None. Other Findings: No evidence  of superficial thrombophlebitis or abnormal fluid collection. IMPRESSION: 1. No evidence of deep venous thrombosis in either lower extremity. 2. Age indeterminate nonocclusive superficial thrombophlebitis in the upper thigh segment of the right great saphenous vein. Electronically Signed   By: Aletta Edouard M.D.   On: 02/25/2021 09:57   DG Chest Port 1 View  Result Date: 2021-03-14 CLINICAL DATA:  Hypoxia EXAM: PORTABLE CHEST 1 VIEW COMPARISON:  Three days ago FINDINGS: Cardiopericardial enlargement which is marked. Biventricular pacer from the left. Bilateral airspace disease which is somewhat patchy. Small pleural effusions are possible. No pneumothorax. IMPRESSION: Increased bilateral airspace disease. Electronically Signed   By: Jorje Guild M.D.   On: 03-14-2021 04:04   DG Chest Port 1 View  Result Date: 02/24/2021 CLINICAL DATA:  Difficulty breathing, hypoxia EXAM: PORTABLE CHEST 1 VIEW COMPARISON:  Previous studies including the examination of 02/18/2021 FINDINGS: Transverse diameter of heart is increased. Pacemaker/defibrillator battery is seen in the left infraclavicular region. Biventricular pacer leads are noted in place. Central pulmonary vessels are slightly more prominent. There is no focal pulmonary consolidation. There is no significant pleural effusion or pneumothorax. IMPRESSION: Cardiomegaly. Central pulmonary vessels are more prominent, possibly suggesting mild CHF. No new focal infiltrates are seen. Electronically Signed   By: Elmer Picker M.D.   On: 02/24/2021 14:48   DG Chest Portable 1 View  Result Date: 02/06/2021 CLINICAL DATA:  Shortness of breath, dyspnea, hypoxia. EXAM: PORTABLE CHEST 1 VIEW COMPARISON:  Portable  chest 02/15/2021. FINDINGS: Severe cardiomegaly is again noted with perihilar vascular engorgement and flow cephalization without overt edema findings or pleural collections. The lungs are generally clear, but the lower lung fields are suboptimally evaluated due to the enlarged heart superimposing. Stable mediastinum with mild aortic tortuosity with calcification in the aortic arch. Again noted is a left chest pacing system with AID wiring, unchanged. Thoracic spondylosis. IMPRESSION: Stable cardiomegaly and vascular engorgement without overt edema. Lungs are clear of focal consolidation with the lower lung fields suboptimally assessed due to superimposition of the enlarged heart. Electronically Signed   By: Telford Nab M.D.   On: 02/06/2021 22:32   DG Chest Portable 1 View  Result Date: 02/15/2021 CLINICAL DATA:  Short of breath and leg swelling and weakness EXAM: PORTABLE CHEST 1 VIEW COMPARISON:  01/10/2021 FINDINGS: Cardiac enlargement. AICD unchanged in position. Mild vascular congestion. Negative for edema or effusion. Mild bibasilar atelectasis. IMPRESSION: Mild pulmonary vascular congestion without edema. Mild bibasilar atelectasis. Marked cardiomegaly. Electronically Signed   By: Franchot Gallo M.D.   On: 02/15/2021 10:42   US BREAST LTD UNI LEFT INC AXILLA  Result Date: 02/02/2021 CLINICAL DATA:  Left breast asymmetry and calcifications seen on patient's new baseline mammogram. EXAM: DIGITAL DIAGNOSTIC UNILATERAL LEFT MAMMOGRAM WITH TOMOSYNTHESIS AND CAD; ULTRASOUND LEFT BREAST LIMITED TECHNIQUE: Left digital diagnostic mammography and breast tomosynthesis was performed. The images were evaluated with computer-aided detection.; Targeted ultrasound examination of the left breast was performed. COMPARISON:  Previous exam(s). ACR Breast Density Category c: The breast tissue is heterogeneously dense, which may obscure small masses. FINDINGS: Additional mammographic views of the left breast  demonstrate loosely grouped punctate calcifications in the slightly lower outer left breast, middle and anterior depth. The area of calcifications spans 6.6 cm in anterior to posterior dimension. No associated masses are seen. Targeted left breast ultrasound is performed demonstrating 2 probably benign cysts at 2:30 o'clock 3 cm from the nipple measuring 0.6 x 0.4 x 0.5 cm and 0.5 x 0.5 x 0.5 cm.  IMPRESSION: Loosely grouped calcifications in the left breast are mildly suspicious. Recommend 1 site stereotactic core needle biopsy of the anterior or posterior extent of involvement. Two probably benign left breast 2:30 o'clock masses. With benign pathology results, recommend six-month follow-up for the remaining of the calcifications and the probably benign left breast masses. RECOMMENDATION: One site stereotactic core needle biopsy of the left breast. I have discussed the findings and recommendations with the patient. If applicable, a reminder letter will be sent to the patient regarding the next appointment. BI-RADS CATEGORY  4: Suspicious. Electronically Signed   By: Fidela Salisbury M.D.   On: 02/02/2021 11:12  MM DIAG BREAST TOMO UNI LEFT  Result Date: 02/02/2021 CLINICAL DATA:  Left breast asymmetry and calcifications seen on patient's new baseline mammogram. EXAM: DIGITAL DIAGNOSTIC UNILATERAL LEFT MAMMOGRAM WITH TOMOSYNTHESIS AND CAD; ULTRASOUND LEFT BREAST LIMITED TECHNIQUE: Left digital diagnostic mammography and breast tomosynthesis was performed. The images were evaluated with computer-aided detection.; Targeted ultrasound examination of the left breast was performed. COMPARISON:  Previous exam(s). ACR Breast Density Category c: The breast tissue is heterogeneously dense, which may obscure small masses. FINDINGS: Additional mammographic views of the left breast demonstrate loosely grouped punctate calcifications in the slightly lower outer left breast, middle and anterior depth. The area of  calcifications spans 6.6 cm in anterior to posterior dimension. No associated masses are seen. Targeted left breast ultrasound is performed demonstrating 2 probably benign cysts at 2:30 o'clock 3 cm from the nipple measuring 0.6 x 0.4 x 0.5 cm and 0.5 x 0.5 x 0.5 cm. IMPRESSION: Loosely grouped calcifications in the left breast are mildly suspicious. Recommend 1 site stereotactic core needle biopsy of the anterior or posterior extent of involvement. Two probably benign left breast 2:30 o'clock masses. With benign pathology results, recommend six-month follow-up for the remaining of the calcifications and the probably benign left breast masses. RECOMMENDATION: One site stereotactic core needle biopsy of the left breast. I have discussed the findings and recommendations with the patient. If applicable, a reminder letter will be sent to the patient regarding the next appointment. BI-RADS CATEGORY  4: Suspicious. Electronically Signed   By: Fidela Salisbury M.D.   On: 02/02/2021 11:12  ECHOCARDIOGRAM COMPLETE  Result Date: 02/16/2021    ECHOCARDIOGRAM REPORT   Patient Name:   Maria Neal Date of Exam: 02/15/2021 Medical Rec #:  [id number]    Height:       65.7 in Accession #:    [id number]   Weight:       161.0 lb Date of Birth:  1950-11-01    BSA:          1.819 m Patient Age:    83 years     BP:           112/54 mmHg Patient Gender: F            HR:           73 bpm. Exam Location:  [facility] Procedure: 2D Echo, Cardiac Doppler and Color Doppler Indications:     CHF-acute systolic B15.17  History:         Patient has prior history of Echocardiogram examinations, most                  recent 08/10/2020. CAD, AICD, COPD; Risk Factors:Hypertension.  Sonographer:     Sherrie Sport Referring Phys:  [place] Diagnosing Phys: Serafina Royals MD  Sonographer Comments: Suboptimal apical window. IMPRESSIONS  1. Left  ventricular ejection fraction, by estimation, is <20%. The left ventricle has severely decreased  function. The left ventricle demonstrates global hypokinesis. The left ventricular internal cavity size was severely dilated. There is mild left ventricular hypertrophy. Left ventricular diastolic parameters are consistent with Grade II diastolic dysfunction (pseudonormalization).  2. Right ventricular systolic function is mildly reduced. The right ventricular size is moderately enlarged.  3. Left atrial size was mildly dilated.  4. Right atrial size was mildly dilated.  5. The mitral valve is normal in structure. Severe mitral valve regurgitation.  6. Tricuspid valve regurgitation is moderate.  7. The aortic valve is normal in structure. Aortic valve regurgitation is trivial. FINDINGS  Left Ventricle: Left ventricular ejection fraction, by estimation, is <20%. The left ventricle has severely decreased function. The left ventricle demonstrates global hypokinesis. The left ventricular internal cavity size was severely dilated. There is mild left ventricular hypertrophy. Left ventricular diastolic parameters are consistent with Grade II diastolic dysfunction (pseudonormalization). Right Ventricle: The right ventricular size is moderately enlarged. No increase in right ventricular wall thickness. Right ventricular systolic function is mildly reduced. Left Atrium: Left atrial size was mildly dilated. Right Atrium: Right atrial size was mildly dilated. Pericardium: There is no evidence of pericardial effusion. Mitral Valve: The mitral valve is normal in structure. Severe mitral valve regurgitation. MV peak gradient, 6.0 mmHg. The mean mitral valve gradient is 2.0 mmHg. Tricuspid Valve: The tricuspid valve is normal in structure. Tricuspid valve regurgitation is moderate. Aortic Valve: The aortic valve is normal in structure. Aortic valve regurgitation is trivial. Aortic regurgitation PHT measures 394 msec. Aortic valve mean gradient measures 3.7 mmHg. Aortic valve peak gradient measures 6.7 mmHg. Aortic valve area, by  VTI measures 1.50 cm. Pulmonic Valve: The pulmonic valve was normal in structure. Pulmonic valve regurgitation is trivial. Aorta: The aortic root and ascending aorta are structurally normal, with no evidence of dilitation. IAS/Shunts: No atrial level shunt detected by color flow Doppler.  LEFT VENTRICLE PLAX 2D LVIDd:         7.10 cm      Diastology LVIDs:         6.70 cm      LV e' medial:    6.20 cm/s LV PW:         1.10 cm      LV E/e' medial:  14.6 LV IVS:        0.80 cm      LV e' lateral:   7.83 cm/s LVOT diam:     2.00 cm      LV E/e' lateral: 11.6 LV SV:         30 LV SV Index:   17 LVOT Area:     3.14 cm  LV Volumes (MOD) LV vol d, MOD A2C: 221.0 ml LV vol d, MOD A4C: 258.0 ml LV vol s, MOD A2C: 99.7 ml LV vol s, MOD A4C: 179.0 ml LV SV MOD A2C:     121.3 ml LV SV MOD A4C:     258.0 ml LV SV MOD BP:      105.8 ml RIGHT VENTRICLE RV Basal diam:  6.60 cm RV S prime:     9.46 cm/s TAPSE (M-mode): 2.3 cm LEFT ATRIUM              Index        RIGHT ATRIUM           Index LA diam:        6.10 cm  3.35 cm/m   RA Area:     29.60 cm LA Vol (A2C):   170.0 ml 93.46 ml/m  RA Volume:   109.00 ml 59.92 ml/m LA Vol (A4C):   163.0 ml 89.61 ml/m LA Biplane Vol: 173.0 ml 95.11 ml/m  AORTIC VALVE AV Area (Vmax):    1.16 cm AV Area (Vmean):   1.21 cm AV Area (VTI):     1.50 cm AV Vmax:           129.33 cm/s AV Vmean:          86.467 cm/s AV VTI:            0.202 m AV Peak Grad:      6.7 mmHg AV Mean Grad:      3.7 mmHg LVOT Vmax:         47.60 cm/s LVOT Vmean:        33.400 cm/s LVOT VTI:          0.097 m LVOT/AV VTI ratio: 0.48 AI PHT:            394 msec  AORTA Ao Root diam: 2.90 cm MITRAL VALVE               TRICUSPID VALVE MV Area (PHT): 3.77 cm    TR Peak grad:   40.4 mmHg MV Area VTI:   1.36 cm    TR Vmax:        318.00 cm/s MV Peak grad:  6.0 mmHg MV Mean grad:  2.0 mmHg    SHUNTS MV Vmax:       1.22 m/s    Systemic VTI:  0.10 m MV Vmean:      67.0 cm/s   Systemic Diam: 2.00 cm MV Decel Time: 201 msec MV E  velocity: 90.80 cm/s MV A velocity: 44.60 cm/s MV E/A ratio:  2.04 Serafina Royals MD Electronically signed by Serafina Royals MD Signature Date/Time: 02/16/2021/8:07:09 AM    Final     Microbiology Recent Results (from the past 240 hour(s))  Resp Panel by RT-PCR (Flu A&B, Covid) Nasopharyngeal Swab     Status: None   Collection Time: 02/16/2021 10:02 PM   Specimen: Nasopharyngeal Swab; Nasopharyngeal(NP) swabs in vial transport medium  Result Value Ref Range Status   SARS Coronavirus 2 by RT PCR NEGATIVE NEGATIVE Final    Comment: (NOTE) SARS-CoV-2 target nucleic acids are NOT DETECTED.  The SARS-CoV-2 RNA is generally detectable in upper respiratory specimens during the acute phase of infection. The lowest concentration of SARS-CoV-2 viral copies this assay can detect is 138 copies/mL. A negative result does not preclude SARS-Cov-2 infection and should not be used as the sole basis for treatment or other patient management decisions. A negative result may occur with  improper specimen collection/handling, submission of specimen other than nasopharyngeal swab, presence of viral mutation(s) within the areas targeted by this assay, and inadequate number of viral copies(<138 copies/mL). A negative result must be combined with clinical observations, patient history, and epidemiological information. The expected result is Negative.  Fact Sheet for Patients:  EntrepreneurPulse.com.au  Fact Sheet for Healthcare Providers:  IncredibleEmployment.be  This test is no t yet approved or cleared by the [place] FDA and  has been authorized for detection and/or diagnosis of SARS-CoV-2 by FDA under an Emergency Use Authorization (EUA). This EUA will remain  in effect (meaning this test can be used) for the duration of the COVID-19 declaration under Section 564(b)(1) of the Act, 21 U.S.C.section 360bbb-3(b)(1),  unless the authorization is terminated  or revoked  sooner.       Influenza A by PCR NEGATIVE NEGATIVE Final   Influenza B by PCR NEGATIVE NEGATIVE Final    Comment: (NOTE) The Xpert Xpress SARS-CoV-2/FLU/RSV plus assay is intended as an aid in the diagnosis of influenza from Nasopharyngeal swab specimens and should not be used as a sole basis for treatment. Nasal washings and aspirates are unacceptable for Xpert Xpress SARS-CoV-2/FLU/RSV testing.  Fact Sheet for Patients: EntrepreneurPulse.com.au  Fact Sheet for Healthcare Providers: IncredibleEmployment.be  This test is not yet approved or cleared by the [place] FDA and has been authorized for detection and/or diagnosis of SARS-CoV-2 by FDA under an Emergency Use Authorization (EUA). This EUA will remain in effect (meaning this test can be used) for the duration of the COVID-19 declaration under Section 564(b)(1) of the Act, 21 U.S.C. section 360bbb-3(b)(1), unless the authorization is terminated or revoked.  Performed at [facility], [address] [place], [place] [address]   CULTURE, BLOOD (ROUTINE X 2) w Reflex to ID Panel     Status: Abnormal   Collection Time: 02/24/21  3:09 PM   Specimen: BLOOD  Result Value Ref Range Status   Specimen Description   Final    BLOOD [facility] Performed at [facility], [address], [place], [place] [address]    Special Requests   Final    BOTTLES DRAWN AEROBIC AND ANAEROBIC BCAV Performed at [facility], [address] [place], [place] [address]    Culture  Setup Time   Final    GRAM POSITIVE COCCI IN BOTH AEROBIC AND ANAEROBIC BOTTLES CRITICAL RESULT CALLED TO, READ BACK BY AND VERIFIED WITH: NATHAN BELUE @0232  ON 02/25/21 SKL Performed at [facility], [place] [address] [place], [place] [address]    Culture STAPHYLOCOCCUS AUREUS (A)  Final   Report Status 03-05-21 FINAL  Final   Organism ID, Bacteria STAPHYLOCOCCUS AUREUS  Final      Susceptibility    Staphylococcus aureus - MIC*    CIPROFLOXACIN <=0.5 SENSITIVE Sensitive     ERYTHROMYCIN RESISTANT Resistant     GENTAMICIN <=0.5 SENSITIVE Sensitive     OXACILLIN <=0.25 SENSITIVE Sensitive     TETRACYCLINE <=1 SENSITIVE Sensitive     VANCOMYCIN <=0.5 SENSITIVE Sensitive     TRIMETH/SULFA <=10 SENSITIVE Sensitive     CLINDAMYCIN RESISTANT Resistant     RIFAMPIN <=0.5 SENSITIVE Sensitive     Inducible Clindamycin POSITIVE Resistant     * STAPHYLOCOCCUS AUREUS  CULTURE, BLOOD (ROUTINE X 2) w Reflex to ID Panel     Status: Abnormal   Collection Time: 02/24/21  3:09 PM   Specimen: BLOOD  Result Value Ref Range Status   Specimen Description   Final    BLOOD RFOA Performed at [facility], [address], [place], [place] [id number]    Special Requests   Final    BOTTLES DRAWN AEROBIC AND ANAEROBIC BCAV Performed at [facility], [address], [place], [place] [address]    Culture  Setup Time   Final    GRAM POSITIVE COCCI IN BOTH AEROBIC AND ANAEROBIC BOTTLES CRITICAL RESULT CALLED TO, READ BACK BY AND VERIFIED WITH: NATHAN BELUE @0232  ON 02/25/21 SKL Performed at [facility], [address] [place], [place] [address]    Culture (A)  Final    STAPHYLOCOCCUS AUREUS SUSCEPTIBILITIES PERFORMED ON PREVIOUS CULTURE WITHIN THE LAST 5 DAYS. Performed at [facility], [address] [place], [place] [address]    Report Status 03/13/21 FINAL  Final  Blood Culture ID Panel (Reflexed)     Status: Abnormal   Collection Time: 02/24/21  3:09 PM  Result Value Ref Range Status   Enterococcus faecalis NOT DETECTED NOT DETECTED Final   Enterococcus Faecium NOT DETECTED NOT DETECTED Final   Listeria monocytogenes NOT DETECTED NOT DETECTED Final   Staphylococcus species DETECTED (A) NOT DETECTED Final    Comment: CRITICAL RESULT CALLED TO, READ BACK BY AND VERIFIED WITH: NATHAN BELUE @0232  ON 02/25/21 SKL    Staphylococcus aureus (BCID) DETECTED (A)  NOT DETECTED Final    Comment: CRITICAL RESULT CALLED TO, READ BACK BY AND VERIFIED WITH: NATHAN BELUE @0232  ON 02/25/21 SKL    Staphylococcus epidermidis NOT DETECTED NOT DETECTED Final   Staphylococcus lugdunensis NOT DETECTED NOT DETECTED Final   Streptococcus species NOT DETECTED NOT DETECTED Final   Streptococcus agalactiae NOT DETECTED NOT DETECTED Final   Streptococcus pneumoniae NOT DETECTED NOT DETECTED Final   Streptococcus pyogenes NOT DETECTED NOT DETECTED Final   A.calcoaceticus-baumannii NOT DETECTED NOT DETECTED Final   Bacteroides fragilis NOT DETECTED NOT DETECTED Final   Enterobacterales NOT DETECTED NOT DETECTED Final   Enterobacter cloacae complex NOT DETECTED NOT DETECTED Final   Escherichia coli NOT DETECTED NOT DETECTED Final   Klebsiella aerogenes NOT DETECTED NOT DETECTED Final   Klebsiella oxytoca NOT DETECTED NOT DETECTED Final   Klebsiella pneumoniae NOT DETECTED NOT DETECTED Final   Proteus species NOT DETECTED NOT DETECTED Final   Salmonella species NOT DETECTED NOT DETECTED Final   Serratia marcescens NOT DETECTED NOT DETECTED Final   Haemophilus influenzae NOT DETECTED NOT DETECTED Final   Neisseria meningitidis NOT DETECTED NOT DETECTED Final   Pseudomonas aeruginosa NOT DETECTED NOT DETECTED Final   Stenotrophomonas maltophilia NOT DETECTED NOT DETECTED Final   Candida albicans NOT DETECTED NOT DETECTED Final   Candida auris NOT DETECTED NOT DETECTED Final   Candida glabrata NOT DETECTED NOT DETECTED Final   Candida krusei NOT DETECTED NOT DETECTED Final   Candida parapsilosis NOT DETECTED NOT DETECTED Final   Candida tropicalis NOT DETECTED NOT DETECTED Final   Cryptococcus neoformans/gattii NOT DETECTED NOT DETECTED Final   Meth resistant mecA/C and MREJ NOT DETECTED NOT DETECTED Final    Comment: Performed at [facility], [address] [place], [place] [address]  CULTURE, BLOOD (ROUTINE X 2) w Reflex to ID Panel     Status: None  (Preliminary result)   Collection Time: 02/26/21  3:58 AM   Specimen: BLOOD  Result Value Ref Range Status   Specimen Description BLOOD BLOOD RIGHT WRIST  Final   Special Requests   Final    BOTTLES DRAWN AEROBIC AND ANAEROBIC Blood Culture adequate volume   Culture   Final    NO GROWTH 1 DAY Performed at [facility], [address] [place], [place] [address]    Report Status PENDING  Incomplete  CULTURE, BLOOD (ROUTINE X 2) w Reflex to ID Panel     Status: None (Preliminary result)   Collection Time: 02/26/21  3:58 AM   Specimen: BLOOD  Result Value Ref Range Status   Specimen Description BLOOD BLOOD RIGHT HAND  Final   Special Requests   Final    BOTTLES DRAWN AEROBIC AND ANAEROBIC Blood Culture adequate volume   Culture   Final    NO GROWTH 1 DAY Performed at [facility], [address],  [place], [place] [address]    Report Status PENDING  Incomplete    Lab Basic Metabolic Panel: Recent Labs  Lab 02/11/2021 2202 02/24/21 0527 02/24/21 1952 02/25/21 0424 02/26/21 0358 2021-03-29 0256  NA 136 139  --  137 132* 135  K 4.1 3.4* 4.3 4.4 4.0 4.1  CL 91* 98  --  91* 91* 89*  CO2 31 30  --  32 31 34*  GLUCOSE 100* 103*  --  145* 114* 83  BUN 67* 63*  --  71* 76* 73*  CREATININE 2.42* 2.24*  --  2.45* 2.29* 2.05*  CALCIUM 8.9 8.0*  --  8.6* 8.0* 8.6*  MG 1.8 1.6* 1.9 2.1 2.0 2.1  PHOS  --  3.0  --  3.5 2.8 2.2*   Liver Function Tests: Recent Labs  Lab 02/24/21 0527  AST 23  ALT 21  ALKPHOS 67  BILITOT 2.2*  PROT 5.3*  ALBUMIN 2.4*   No results for input(s): LIPASE, AMYLASE in the last 168 hours. No results for input(s): AMMONIA in the last 168 hours. CBC: Recent Labs  Lab 02/20/2021 2202 02/24/21 0527 02/25/21 0424 02/26/21 0358 March 29, 2021 0256  WBC 8.7 9.1 12.0* 11.0* 8.3  NEUTROABS  --  7.8* 10.7* 9.2* 6.6  HGB 9.5* 8.8* 9.8* 9.1* 9.3*  HCT 33.5* 30.6* 34.3* 31.3* 31.6*  MCV 87.9 88.2 88.6 85.5 84.9  PLT 121* 94* 107* 101* 88*    Cardiac Enzymes: No results for input(s): CKTOTAL, CKMB, CKMBINDEX, TROPONINI in the last 168 hours. Sepsis Labs: Recent Labs  Lab 02/24/21 0527 02/24/21 1743 02/25/21 0424 02/26/21 0358 03/29/2021 0256  PROCALCITON 2.20  --   --   --   --   WBC 9.1  --  12.0* 11.0* 8.3  LATICACIDVEN  --  1.9  --   --   --     Procedures/Operations  No invasive procedures/operations performed during this admission   C. Derrill Kay, MD Advanced Bronchoscopy P[facility]  03-29-2021, 4:13 PM

## 2021-03-02 NOTE — Progress Notes (Signed)
Chaplain Maggie offered support to family at bedside. Several family members are taking turns visiting patient who is communicating with them. Love and support are evident as stories are shared. A comfort cart delivered by dining services is very much appreciated by the family. Chaplain will continue to follow family to offer support as needed.

## 2021-03-02 DEATH — deceased

## 2021-03-03 LAB — CULTURE, BLOOD (ROUTINE X 2)
Culture: NO GROWTH
Culture: NO GROWTH
Special Requests: ADEQUATE
Special Requests: ADEQUATE

## 2021-03-09 ENCOUNTER — Ambulatory Visit: Payer: Medicare Other | Admitting: Gastroenterology

## 2021-04-02 NOTE — Progress Notes (Signed)
Diagnosis of sepsis was present on admission.

## 2021-04-07 ENCOUNTER — Ambulatory Visit: Payer: Medicare Other | Admitting: Family

## 2022-10-14 IMAGING — DX DG CHEST 1V PORT
1 series · 1 of 1 positions shown · non-contrast
Comparison: 03/14/2019

CLINICAL DATA: Acute systolic heart failure

EXAM:
PORTABLE CHEST 1 VIEW

[chest]
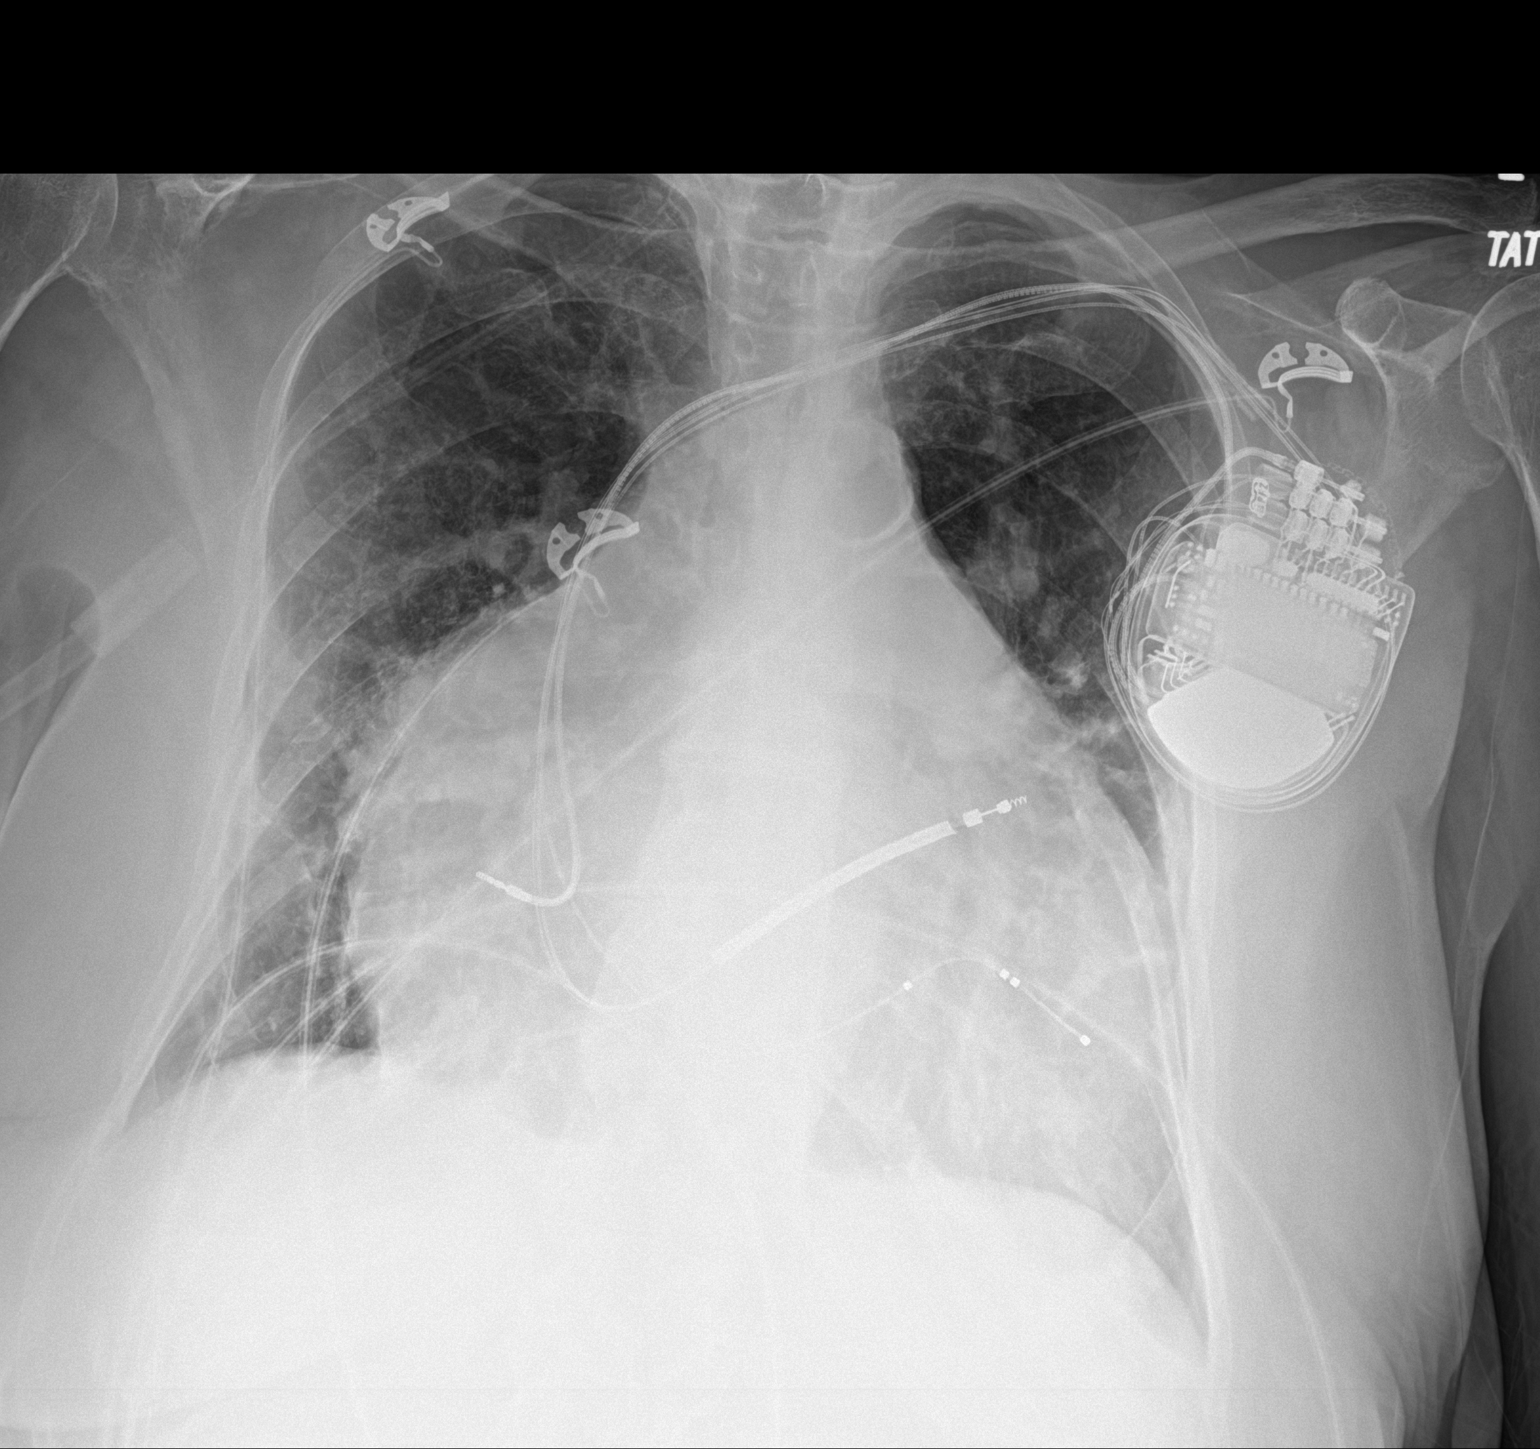

[1 of 1 positions shown; findings below may reference images not displayed]

FINDINGS: Cardiomegaly. Left pacer/AICD remains in place, unchanged. No overt
edema. No confluent opacities or effusions.
IMPRESSION: No acute cardiopulmonary disease.  Cardiomegaly.

## 2023-01-11 IMAGING — CR DG CHEST 2V
2 series · 2 of 2 positions shown · non-contrast
Comparison: 08/07/2020.

CLINICAL DATA: hypoxia, SHOB

EXAM:
CHEST - 2 VIEW

[chest lat]
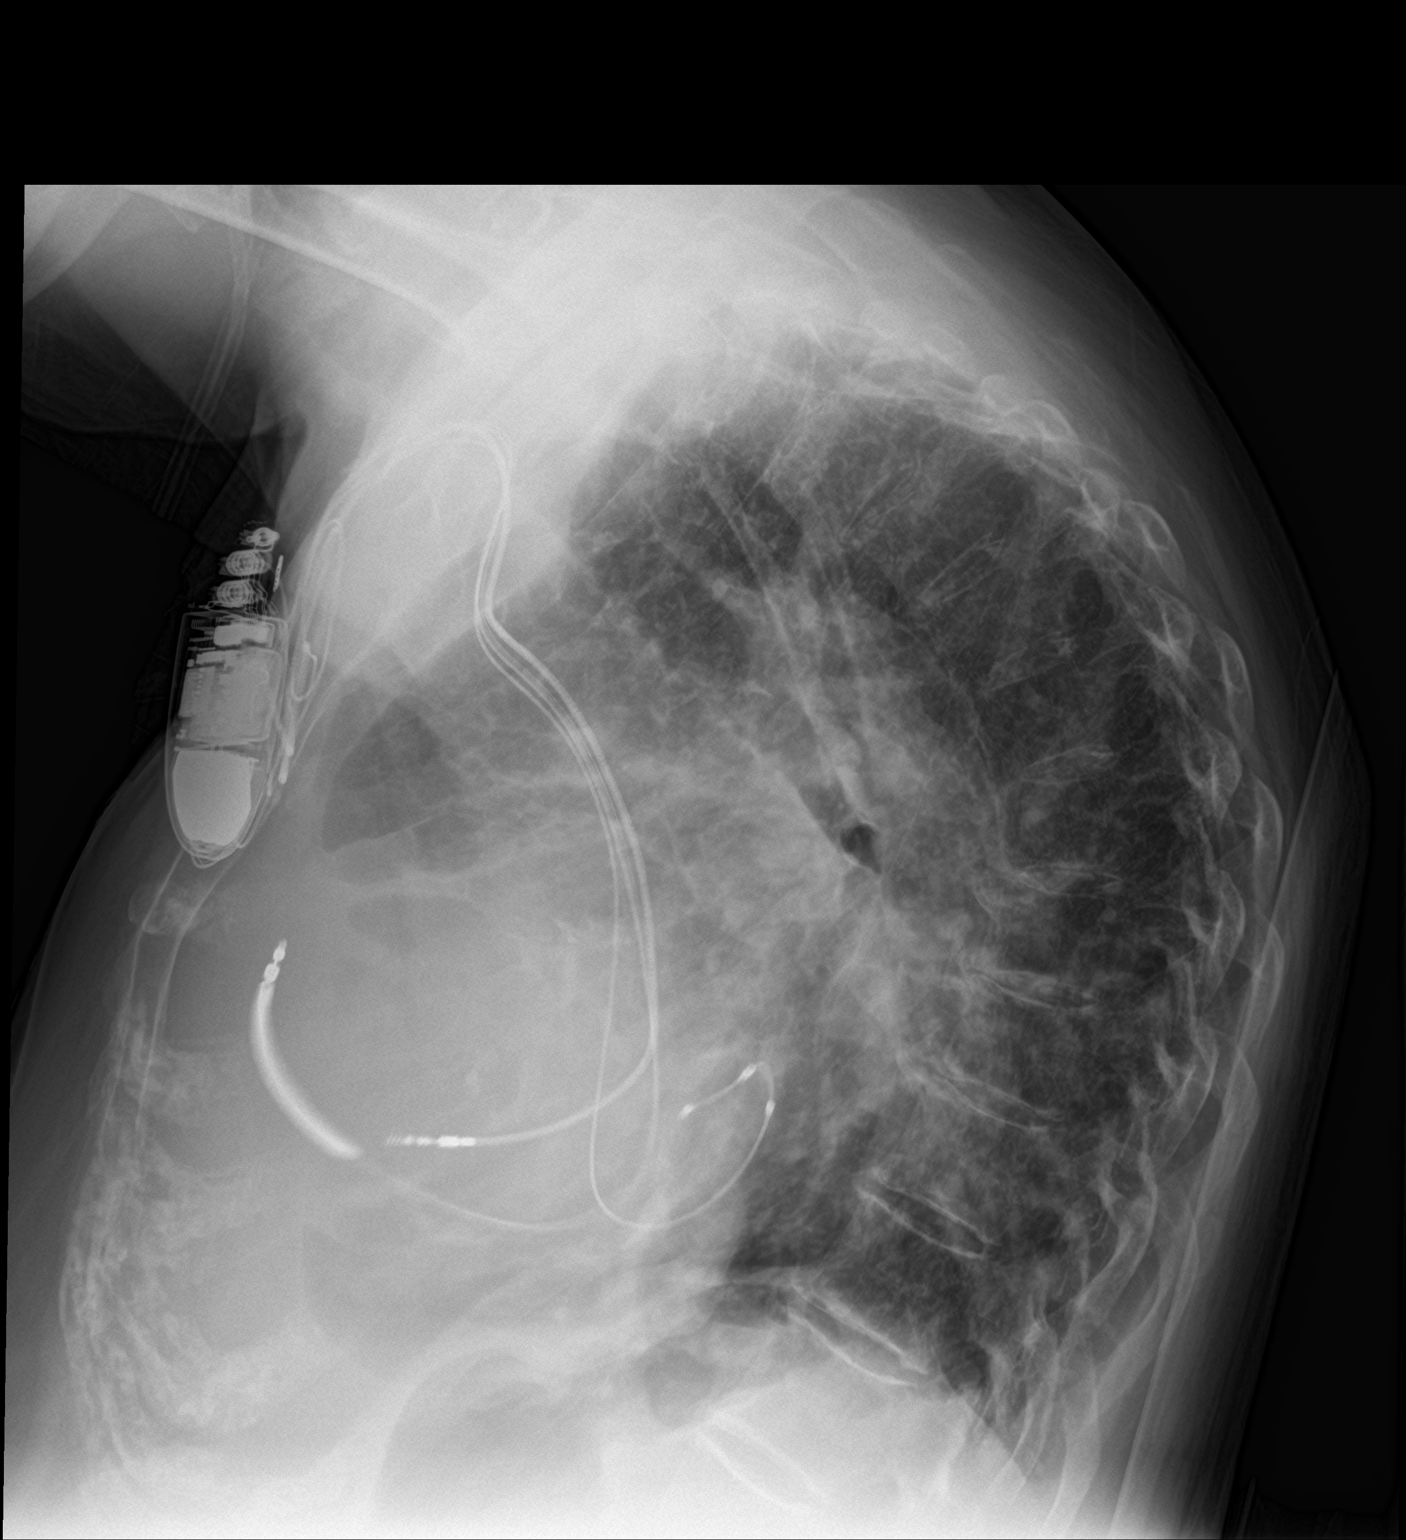

[chest ap]
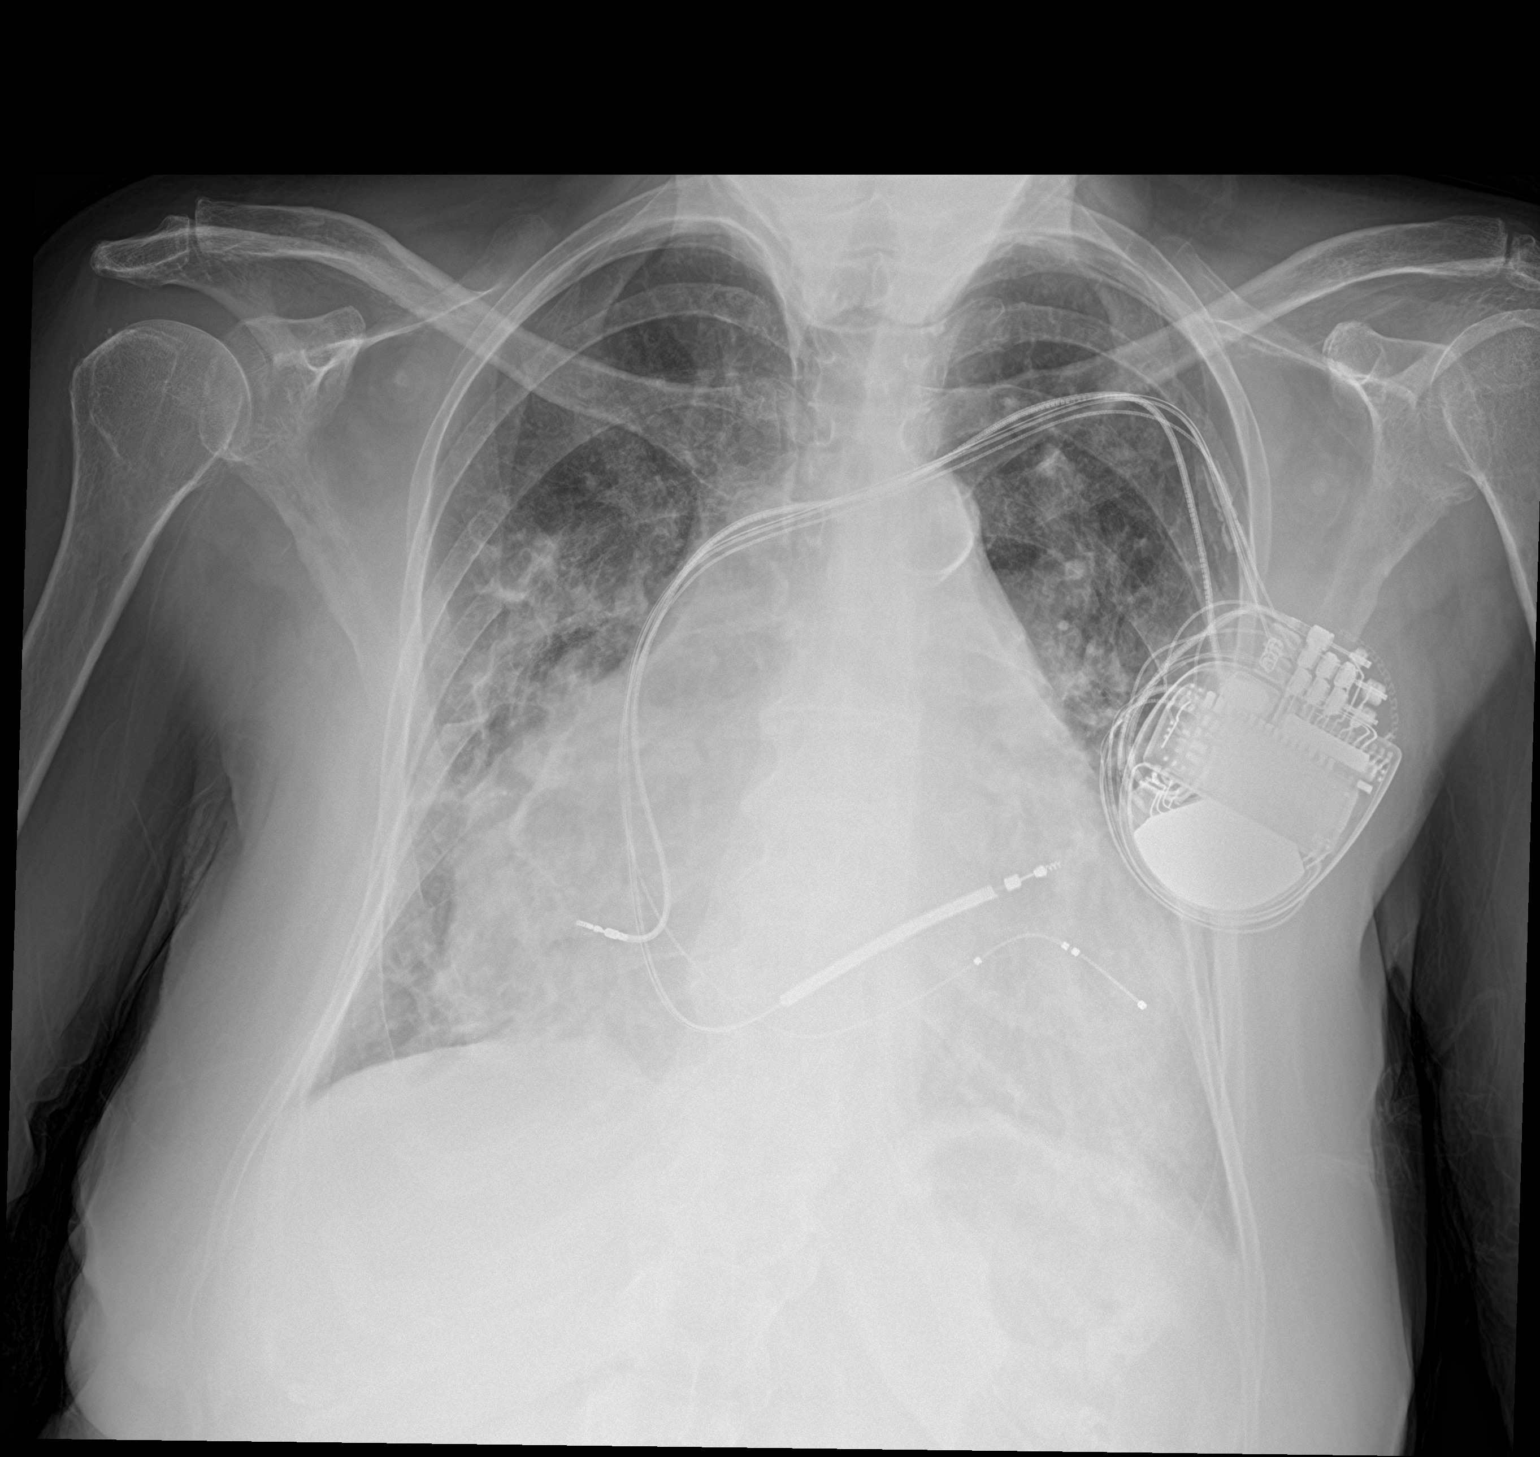

[2 of 2 positions shown; findings below may reference images not displayed]

FINDINGS: Marked enlargement the cardiac silhouette. Left subclavian approach
cardiac rhythm maintenance device. Calcific atherosclerosis of the
aorta. Increased perihilar opacities bilaterally.
IMPRESSION: 1. Increased perihilar opacities bilaterally, probably edema.
Infection is not excluded.
2. Marked cardiomegaly.

## 2023-02-21 IMAGING — CT CT ABD-PELV W/O CM
2 of 4 series · 16 of 46 positions shown, 18 images · non-contrast
Comparison: December 17, 2018.

CLINICAL DATA: A 70-year-old female presents with history of
potential duodenal ulcer. Abnormality discovered on EGD.

EXAM:
CT ABDOMEN AND PELVIS WITHOUT CONTRAST
TECHNIQUE: Multidetector CT imaging of the abdomen and pelvis was performed
following the standard protocol without IV contrast.

[Series 2: axial st · axial · 0.82mm/px · z∈[-439,-44]mm · 13 of 87 slices shown, 15 images]
[im 4/87  soft-tissue]
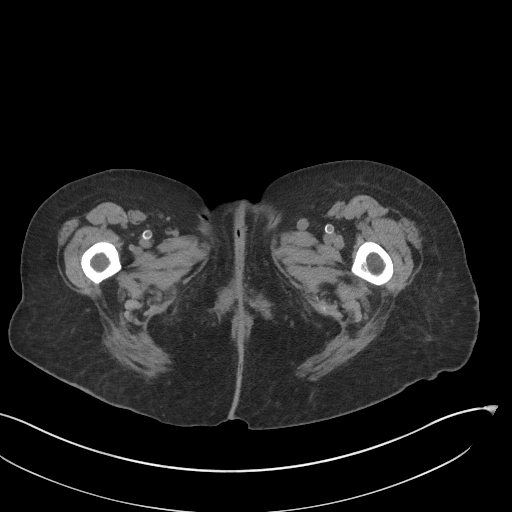
[im 4/87  bone]
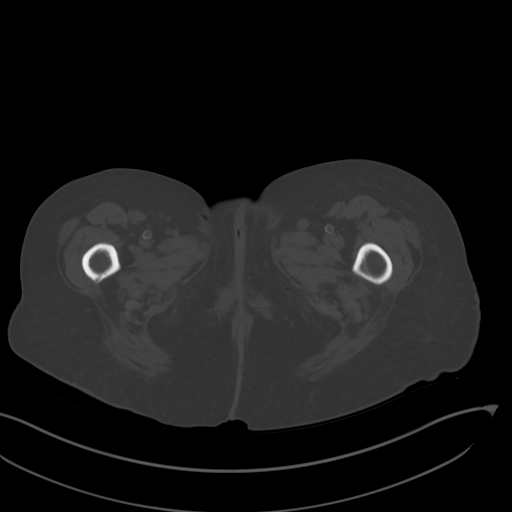
[im 10/87  soft-tissue]
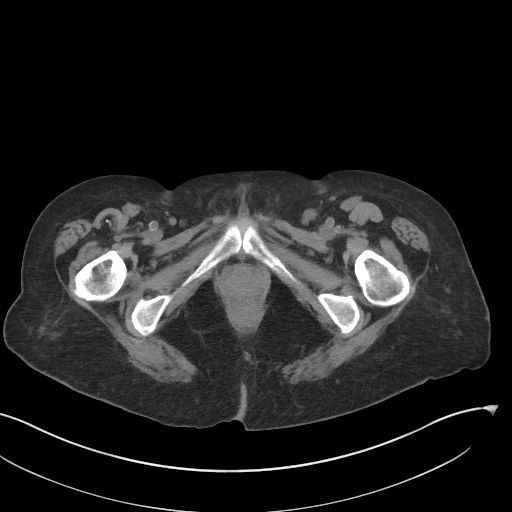
[im 17/87  soft-tissue]
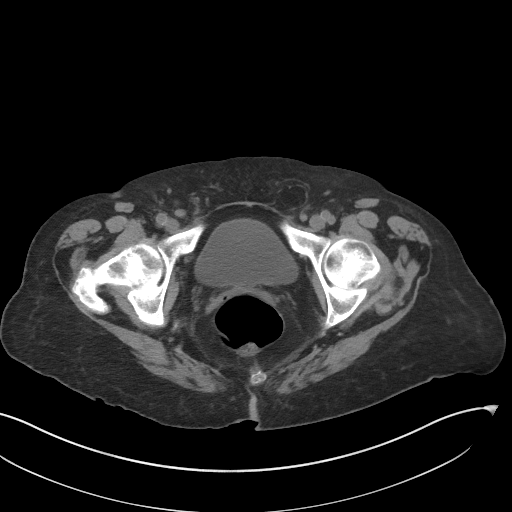
[im 24/87  soft-tissue]
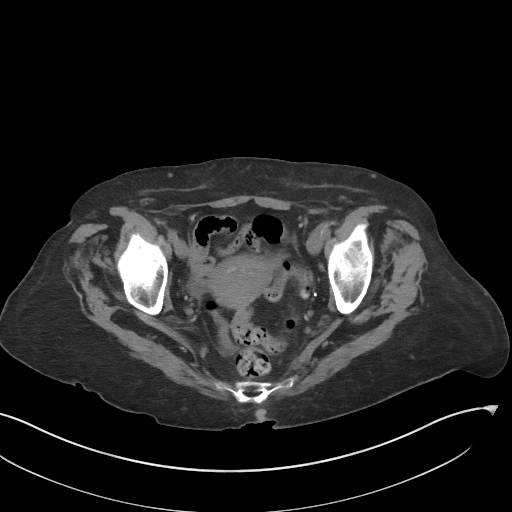
[im 30/87  soft-tissue]
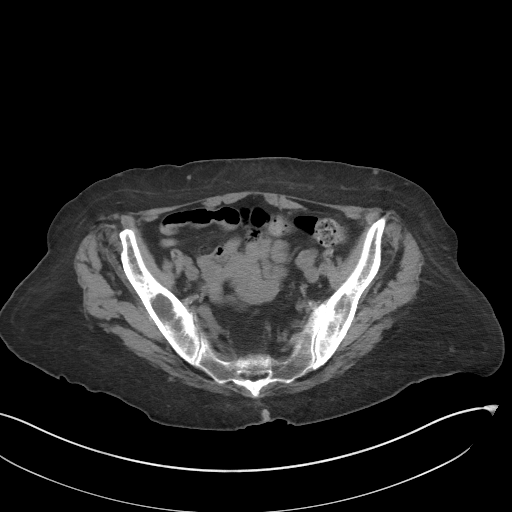
[im 37/87  soft-tissue]
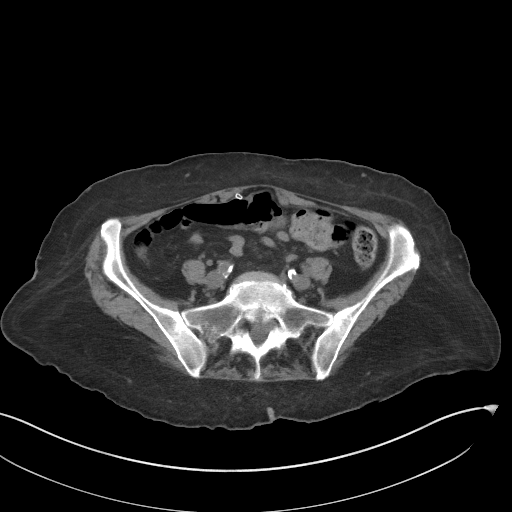
[im 44/87  soft-tissue]
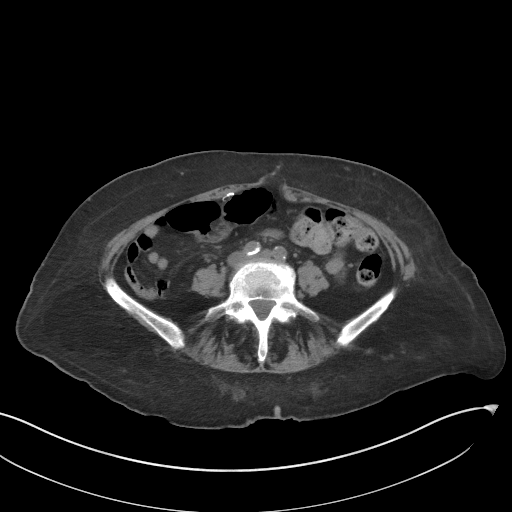
[im 50/87  soft-tissue]
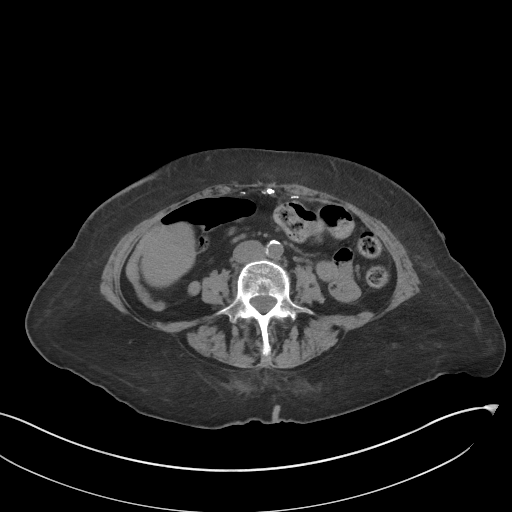
[im 57/87  soft-tissue]
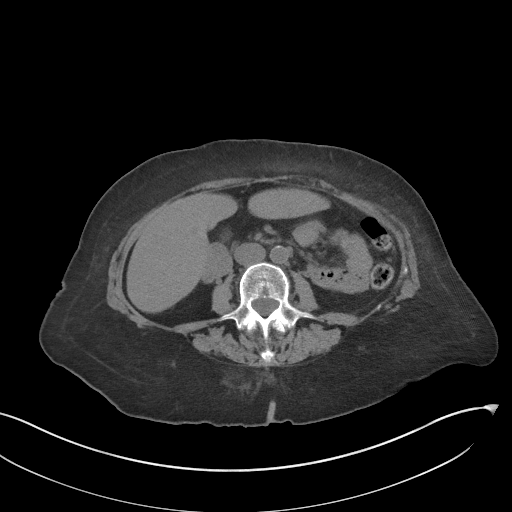
[im 57/87  bone]
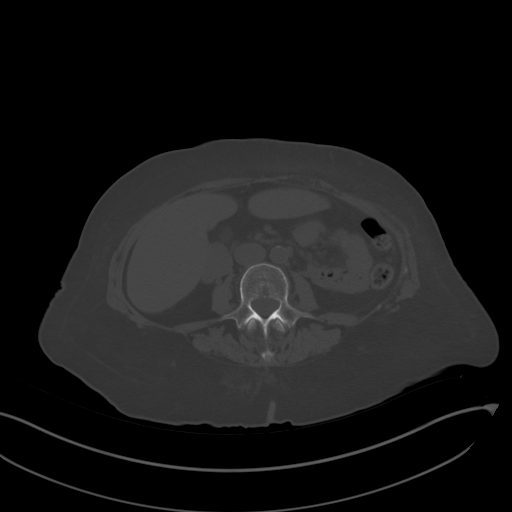
[im 63/87  soft-tissue]
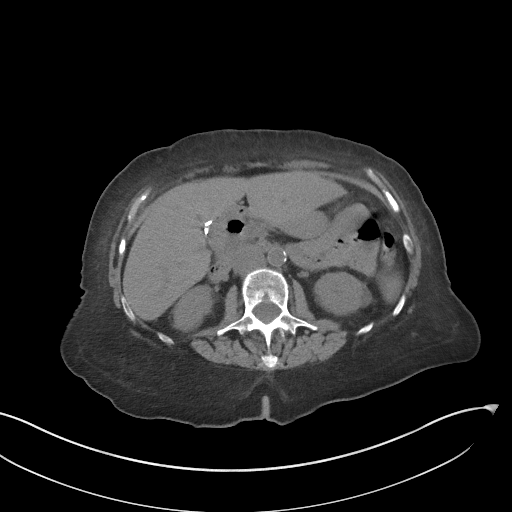
[im 70/87  soft-tissue]
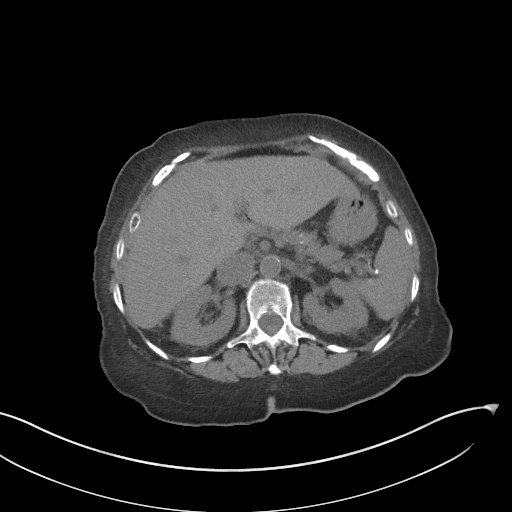
[im 77/87  soft-tissue]
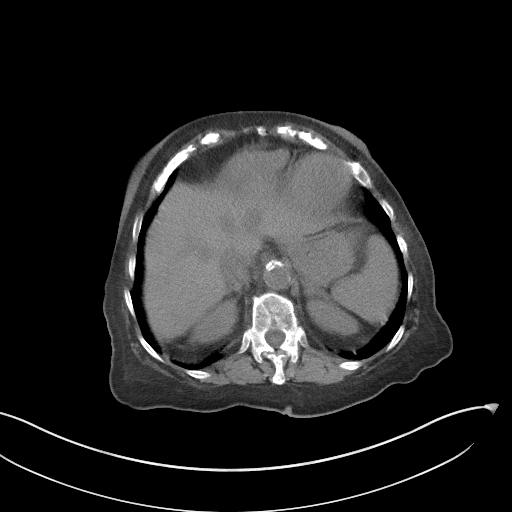
[im 83/87  soft-tissue]
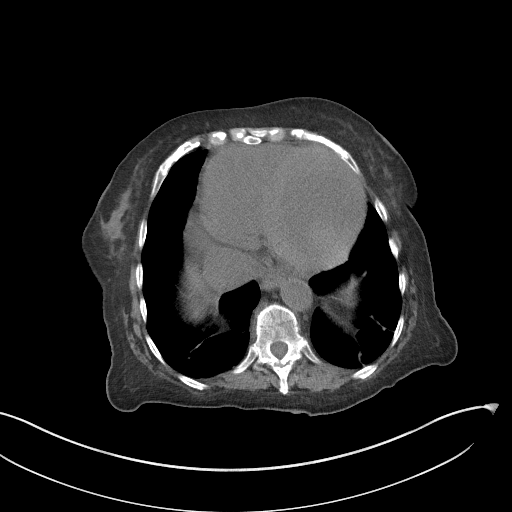

[Series 5: coronal st · coronal · 0.83mm/px · 3 of 85 slices shown]
[im 29/85  soft-tissue]
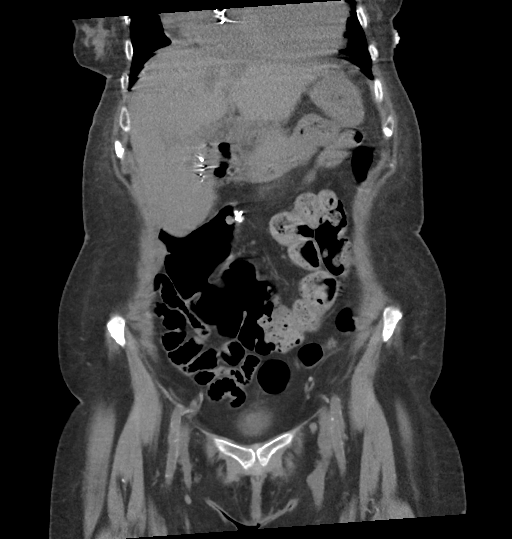
[im 38/85  soft-tissue]
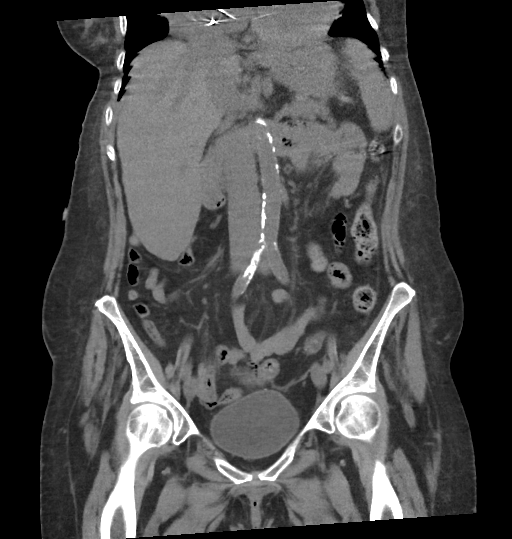
[im 47/85  soft-tissue]
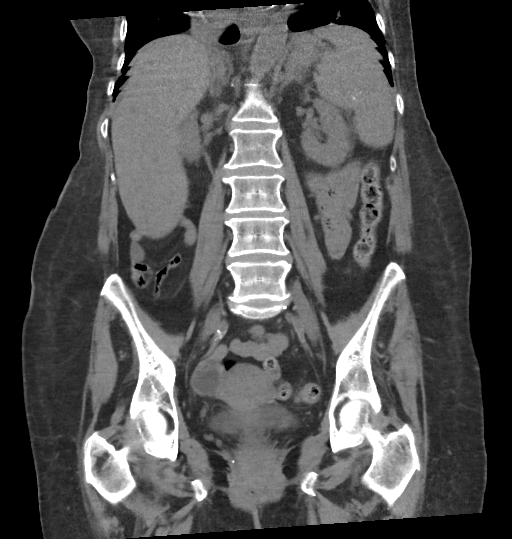

[16 of 46 positions shown; findings below may reference images not displayed]

FINDINGS: Lower chest: Basilar scarring atelectasis. Marked cardiac
enlargement as on previous imaging, heart is incompletely imaged. No
pericardial effusion.

Hepatobiliary: Smooth hepatic contours. Post cholecystectomy without
gross biliary duct distension.

Pancreas: Unremarkable grossly without signs of adjacent
inflammation.

Spleen: Normal size and contour.

Adrenals/Urinary Tract: Adrenal glands are normal.

Perinephric stranding which is symmetric and bilateral. No
associated hydronephrosis. No nephrolithiasis. No ureteral or renal
calculi. No stranding adjacent to the urinary bladder.

8 mm intermediate to high density area in the posterior interpolar
RIGHT kidney is more likely hemorrhagic cyst but measures less than
70 Hounsfield units.

Stomach/Bowel: Stomach without distension or substantial surrounding
stranding. A collection of gas adjacent to the descending duodenum
measuring 2.2 x 1.9 cm was present on previous imaging and was
filled with debris compatible with duodenal diverticulum along the
second portion of the duodenum. No extraluminal gas in the area. No
substantial surrounding stranding.

No small bowel dilation.

No pericolonic inflammation

Vascular/Lymphatic:

Aortic atherosclerosis. No sign of aneurysm. Smooth contour of the
IVC. There is no gastrohepatic or hepatoduodenal ligament
lymphadenopathy. No retroperitoneal or mesenteric lymphadenopathy.

No pelvic sidewall lymphadenopathy.

Limited assessment of retroperitoneal and vascular structures due to
lack of intravenous contrast.

Reproductive: Unremarkable by CT.  No adnexal mass.

Other: Post ventral abdominal wall reconstruction. LEFT inguinal
hernia is small and contains only fat. No ascites. No free air or
gas dissecting through the retroperitoneum.

Musculoskeletal: No acute bone finding. No destructive bone process.
Spinal degenerative changes.
IMPRESSION: Approximately 2.2 x 1.9 cm duodenal diverticulum is unchanged
compared to prior imaging.

No acute findings.

8 mm lesion along the posterior interpolar RIGHT kidney is likely
hemorrhagic cyst but is indeterminate based on current imaging.
Consider nonemergent follow-up sonogram for complete
characterization.

Marked cardiac enlargement as on previous imaging, heart is
incompletely imaged.

Aortic atherosclerosis.

Aortic Atherosclerosis ([id number]).
